# Patient Record
Sex: Female | Born: 1950 | Race: White | Hispanic: No | Marital: Married | Smoking: Former smoker
Health system: Southern US, Community
[De-identification: ages and names within clinical notes are randomized; demographics above are authoritative.]

## PROBLEM LIST (undated history)

## (undated) DIAGNOSIS — M25569 Pain in unspecified knee: Secondary | ICD-10-CM

## (undated) DIAGNOSIS — F419 Anxiety disorder, unspecified: Secondary | ICD-10-CM

## (undated) DIAGNOSIS — F32A Depression, unspecified: Secondary | ICD-10-CM

## (undated) DIAGNOSIS — I10 Essential (primary) hypertension: Secondary | ICD-10-CM

## (undated) DIAGNOSIS — H269 Unspecified cataract: Secondary | ICD-10-CM

## (undated) DIAGNOSIS — F329 Major depressive disorder, single episode, unspecified: Secondary | ICD-10-CM

## (undated) DIAGNOSIS — M1990 Unspecified osteoarthritis, unspecified site: Secondary | ICD-10-CM

## (undated) DIAGNOSIS — Z9989 Dependence on other enabling machines and devices: Secondary | ICD-10-CM

## (undated) DIAGNOSIS — H409 Unspecified glaucoma: Secondary | ICD-10-CM

## (undated) DIAGNOSIS — G4733 Obstructive sleep apnea (adult) (pediatric): Secondary | ICD-10-CM

## (undated) DIAGNOSIS — K219 Gastro-esophageal reflux disease without esophagitis: Secondary | ICD-10-CM

## (undated) DIAGNOSIS — E669 Obesity, unspecified: Secondary | ICD-10-CM

## (undated) DIAGNOSIS — D649 Anemia, unspecified: Secondary | ICD-10-CM

## (undated) DIAGNOSIS — M2550 Pain in unspecified joint: Secondary | ICD-10-CM

## (undated) DIAGNOSIS — E785 Hyperlipidemia, unspecified: Secondary | ICD-10-CM

## (undated) DIAGNOSIS — G4730 Sleep apnea, unspecified: Secondary | ICD-10-CM

## (undated) DIAGNOSIS — E039 Hypothyroidism, unspecified: Secondary | ICD-10-CM

## (undated) DIAGNOSIS — C801 Malignant (primary) neoplasm, unspecified: Secondary | ICD-10-CM

## (undated) DIAGNOSIS — I639 Cerebral infarction, unspecified: Secondary | ICD-10-CM

## (undated) DIAGNOSIS — K5730 Diverticulosis of large intestine without perforation or abscess without bleeding: Secondary | ICD-10-CM

## (undated) DIAGNOSIS — R011 Cardiac murmur, unspecified: Secondary | ICD-10-CM

## (undated) HISTORY — DX: Pain in unspecified knee: M25.569

## (undated) HISTORY — DX: Diverticulosis of large intestine without perforation or abscess without bleeding: K57.30

## (undated) HISTORY — PX: CATARACT EXTRACTION: SUR2

## (undated) HISTORY — DX: Cardiac murmur, unspecified: R01.1

## (undated) HISTORY — PX: COLONOSCOPY: SHX174

## (undated) HISTORY — DX: Unspecified glaucoma: H40.9

## (undated) HISTORY — DX: Pain in unspecified joint: M25.50

## (undated) HISTORY — PX: OTHER SURGICAL HISTORY: SHX169

## (undated) HISTORY — DX: Anemia, unspecified: D64.9

## (undated) HISTORY — PX: THYROIDECTOMY, PARTIAL: SHX18

## (undated) HISTORY — DX: Anxiety disorder, unspecified: F41.9

## (undated) HISTORY — DX: Major depressive disorder, single episode, unspecified: F32.9

## (undated) HISTORY — PX: BREAST SURGERY: SHX581

## (undated) HISTORY — DX: Hyperlipidemia, unspecified: E78.5

## (undated) HISTORY — DX: Sleep apnea, unspecified: G47.30

## (undated) HISTORY — DX: Depression, unspecified: F32.A

## (undated) HISTORY — DX: Obesity, unspecified: E66.9

## (undated) HISTORY — DX: Unspecified cataract: H26.9

## (undated) HISTORY — DX: Malignant (primary) neoplasm, unspecified: C80.1

## (undated) HISTORY — DX: Hypothyroidism, unspecified: E03.9

## (undated) HISTORY — DX: Dependence on other enabling machines and devices: Z99.89

## (undated) HISTORY — PX: GLAUCOMA REPAIR: SHX214

## (undated) HISTORY — DX: Obstructive sleep apnea (adult) (pediatric): G47.33

## (undated) HISTORY — PX: TONSILLECTOMY: SUR1361

## (undated) HISTORY — PX: MASTECTOMY, RADICAL: SHX710

## (undated) HISTORY — DX: Unspecified osteoarthritis, unspecified site: M19.90

---

## 2006-10-04 DIAGNOSIS — C801 Malignant (primary) neoplasm, unspecified: Secondary | ICD-10-CM

## 2006-10-04 HISTORY — DX: Malignant (primary) neoplasm, unspecified: C80.1

## 2010-01-24 LAB — HM PAP SMEAR: HM Pap smear: NORMAL

## 2010-04-27 LAB — HM COLONOSCOPY

## 2014-01-19 LAB — BASIC METABOLIC PANEL
BUN: 16 mg/dL (ref 4–21)
Creatinine: 0.8 mg/dL (ref <=1.1)
GLUCOSE: 81 mg/dL
Potassium: 4.6 mmol/L (ref 3.4–5.3)
Sodium: 142 mmol/L (ref 137–147)

## 2014-01-19 LAB — HEPATIC FUNCTION PANEL
ALK PHOS: 80 U/L (ref 25–125)
ALT: 42 U/L — AB (ref 7–35)
AST: 27 U/L (ref 13–35)
BILIRUBIN, TOTAL: 0.6 mg/dL

## 2014-01-19 LAB — LIPID PANEL
Cholesterol: 184 mg/dL (ref 0–200)
HDL: 38 mg/dL (ref 35–70)
LDL CALC: 118 mg/dL
Triglycerides: 118 mg/dL (ref 40–160)

## 2014-01-19 LAB — CBC AND DIFFERENTIAL
HCT: 39 % (ref 36–46)
Hemoglobin: 12.3 g/dL (ref 12.0–16.0)
NEUTROS ABS: 5 /uL
PLATELETS: 211 10*3/uL (ref 150–399)
WBC: 6.8 10*3/mL

## 2014-01-19 LAB — TSH: TSH: 0.97 u[IU]/mL (ref <=5.90)

## 2014-06-12 DIAGNOSIS — M7752 Other enthesopathy of left foot: Secondary | ICD-10-CM | POA: Diagnosis not present

## 2014-07-23 DIAGNOSIS — H1032 Unspecified acute conjunctivitis, left eye: Secondary | ICD-10-CM | POA: Diagnosis not present

## 2014-09-13 DIAGNOSIS — L03211 Cellulitis of face: Secondary | ICD-10-CM | POA: Diagnosis not present

## 2014-09-13 DIAGNOSIS — L0201 Cutaneous abscess of face: Secondary | ICD-10-CM | POA: Diagnosis not present

## 2014-10-29 DIAGNOSIS — H4011X1 Primary open-angle glaucoma, mild stage: Secondary | ICD-10-CM | POA: Diagnosis not present

## 2014-11-18 DIAGNOSIS — M7752 Other enthesopathy of left foot: Secondary | ICD-10-CM | POA: Diagnosis not present

## 2014-11-27 DIAGNOSIS — R262 Difficulty in walking, not elsewhere classified: Secondary | ICD-10-CM | POA: Diagnosis not present

## 2014-11-27 DIAGNOSIS — M6281 Muscle weakness (generalized): Secondary | ICD-10-CM | POA: Diagnosis not present

## 2014-11-27 DIAGNOSIS — M7752 Other enthesopathy of left foot: Secondary | ICD-10-CM | POA: Diagnosis not present

## 2014-11-27 DIAGNOSIS — M79672 Pain in left foot: Secondary | ICD-10-CM | POA: Diagnosis not present

## 2014-11-30 DIAGNOSIS — M79672 Pain in left foot: Secondary | ICD-10-CM | POA: Diagnosis not present

## 2014-11-30 DIAGNOSIS — M7752 Other enthesopathy of left foot: Secondary | ICD-10-CM | POA: Diagnosis not present

## 2014-11-30 DIAGNOSIS — M6281 Muscle weakness (generalized): Secondary | ICD-10-CM | POA: Diagnosis not present

## 2014-11-30 DIAGNOSIS — R262 Difficulty in walking, not elsewhere classified: Secondary | ICD-10-CM | POA: Diagnosis not present

## 2014-12-02 DIAGNOSIS — M6281 Muscle weakness (generalized): Secondary | ICD-10-CM | POA: Diagnosis not present

## 2014-12-02 DIAGNOSIS — M7752 Other enthesopathy of left foot: Secondary | ICD-10-CM | POA: Diagnosis not present

## 2014-12-02 DIAGNOSIS — R262 Difficulty in walking, not elsewhere classified: Secondary | ICD-10-CM | POA: Diagnosis not present

## 2014-12-02 DIAGNOSIS — M79672 Pain in left foot: Secondary | ICD-10-CM | POA: Diagnosis not present

## 2014-12-04 DIAGNOSIS — M6281 Muscle weakness (generalized): Secondary | ICD-10-CM | POA: Diagnosis not present

## 2014-12-04 DIAGNOSIS — M79672 Pain in left foot: Secondary | ICD-10-CM | POA: Diagnosis not present

## 2014-12-04 DIAGNOSIS — R262 Difficulty in walking, not elsewhere classified: Secondary | ICD-10-CM | POA: Diagnosis not present

## 2014-12-04 DIAGNOSIS — M7752 Other enthesopathy of left foot: Secondary | ICD-10-CM | POA: Diagnosis not present

## 2014-12-17 DIAGNOSIS — M6281 Muscle weakness (generalized): Secondary | ICD-10-CM | POA: Diagnosis not present

## 2014-12-17 DIAGNOSIS — M79672 Pain in left foot: Secondary | ICD-10-CM | POA: Diagnosis not present

## 2014-12-17 DIAGNOSIS — R262 Difficulty in walking, not elsewhere classified: Secondary | ICD-10-CM | POA: Diagnosis not present

## 2014-12-17 DIAGNOSIS — M7752 Other enthesopathy of left foot: Secondary | ICD-10-CM | POA: Diagnosis not present

## 2014-12-21 DIAGNOSIS — R262 Difficulty in walking, not elsewhere classified: Secondary | ICD-10-CM | POA: Diagnosis not present

## 2014-12-21 DIAGNOSIS — M6281 Muscle weakness (generalized): Secondary | ICD-10-CM | POA: Diagnosis not present

## 2014-12-21 DIAGNOSIS — M7752 Other enthesopathy of left foot: Secondary | ICD-10-CM | POA: Diagnosis not present

## 2014-12-21 DIAGNOSIS — M79672 Pain in left foot: Secondary | ICD-10-CM | POA: Diagnosis not present

## 2014-12-22 DIAGNOSIS — M25562 Pain in left knee: Secondary | ICD-10-CM | POA: Diagnosis not present

## 2014-12-24 DIAGNOSIS — M79672 Pain in left foot: Secondary | ICD-10-CM | POA: Diagnosis not present

## 2014-12-24 DIAGNOSIS — M6281 Muscle weakness (generalized): Secondary | ICD-10-CM | POA: Diagnosis not present

## 2014-12-24 DIAGNOSIS — R262 Difficulty in walking, not elsewhere classified: Secondary | ICD-10-CM | POA: Diagnosis not present

## 2014-12-24 DIAGNOSIS — M7752 Other enthesopathy of left foot: Secondary | ICD-10-CM | POA: Diagnosis not present

## 2014-12-28 DIAGNOSIS — M79672 Pain in left foot: Secondary | ICD-10-CM | POA: Diagnosis not present

## 2014-12-28 DIAGNOSIS — R262 Difficulty in walking, not elsewhere classified: Secondary | ICD-10-CM | POA: Diagnosis not present

## 2014-12-28 DIAGNOSIS — M6281 Muscle weakness (generalized): Secondary | ICD-10-CM | POA: Diagnosis not present

## 2014-12-28 DIAGNOSIS — M7752 Other enthesopathy of left foot: Secondary | ICD-10-CM | POA: Diagnosis not present

## 2015-01-01 DIAGNOSIS — M7752 Other enthesopathy of left foot: Secondary | ICD-10-CM | POA: Diagnosis not present

## 2015-01-01 DIAGNOSIS — R262 Difficulty in walking, not elsewhere classified: Secondary | ICD-10-CM | POA: Diagnosis not present

## 2015-01-01 DIAGNOSIS — M79672 Pain in left foot: Secondary | ICD-10-CM | POA: Diagnosis not present

## 2015-01-01 DIAGNOSIS — M6281 Muscle weakness (generalized): Secondary | ICD-10-CM | POA: Diagnosis not present

## 2015-01-04 DIAGNOSIS — M7752 Other enthesopathy of left foot: Secondary | ICD-10-CM | POA: Diagnosis not present

## 2015-01-04 DIAGNOSIS — R262 Difficulty in walking, not elsewhere classified: Secondary | ICD-10-CM | POA: Diagnosis not present

## 2015-01-04 DIAGNOSIS — M79672 Pain in left foot: Secondary | ICD-10-CM | POA: Diagnosis not present

## 2015-01-04 DIAGNOSIS — M6281 Muscle weakness (generalized): Secondary | ICD-10-CM | POA: Diagnosis not present

## 2015-01-07 DIAGNOSIS — M79672 Pain in left foot: Secondary | ICD-10-CM | POA: Diagnosis not present

## 2015-01-07 DIAGNOSIS — M6281 Muscle weakness (generalized): Secondary | ICD-10-CM | POA: Diagnosis not present

## 2015-01-07 DIAGNOSIS — R262 Difficulty in walking, not elsewhere classified: Secondary | ICD-10-CM | POA: Diagnosis not present

## 2015-01-07 DIAGNOSIS — M7752 Other enthesopathy of left foot: Secondary | ICD-10-CM | POA: Diagnosis not present

## 2015-03-05 DIAGNOSIS — M19072 Primary osteoarthritis, left ankle and foot: Secondary | ICD-10-CM | POA: Diagnosis not present

## 2015-03-05 DIAGNOSIS — B351 Tinea unguium: Secondary | ICD-10-CM | POA: Diagnosis not present

## 2015-03-05 DIAGNOSIS — L601 Onycholysis: Secondary | ICD-10-CM | POA: Diagnosis not present

## 2015-03-05 DIAGNOSIS — L602 Onychogryphosis: Secondary | ICD-10-CM | POA: Diagnosis not present

## 2015-03-05 DIAGNOSIS — M19071 Primary osteoarthritis, right ankle and foot: Secondary | ICD-10-CM | POA: Diagnosis not present

## 2015-03-25 DIAGNOSIS — M19071 Primary osteoarthritis, right ankle and foot: Secondary | ICD-10-CM | POA: Diagnosis not present

## 2015-03-25 DIAGNOSIS — L602 Onychogryphosis: Secondary | ICD-10-CM | POA: Diagnosis not present

## 2015-03-25 DIAGNOSIS — M19072 Primary osteoarthritis, left ankle and foot: Secondary | ICD-10-CM | POA: Diagnosis not present

## 2015-04-27 DIAGNOSIS — D2372 Other benign neoplasm of skin of left lower limb, including hip: Secondary | ICD-10-CM | POA: Diagnosis not present

## 2015-05-07 DIAGNOSIS — H401122 Primary open-angle glaucoma, left eye, moderate stage: Secondary | ICD-10-CM | POA: Diagnosis not present

## 2015-05-07 DIAGNOSIS — Z0100 Encounter for examination of eyes and vision without abnormal findings: Secondary | ICD-10-CM | POA: Diagnosis not present

## 2015-05-07 DIAGNOSIS — H401111 Primary open-angle glaucoma, right eye, mild stage: Secondary | ICD-10-CM | POA: Diagnosis not present

## 2015-05-07 DIAGNOSIS — H2513 Age-related nuclear cataract, bilateral: Secondary | ICD-10-CM | POA: Diagnosis not present

## 2015-06-22 DIAGNOSIS — H25012 Cortical age-related cataract, left eye: Secondary | ICD-10-CM | POA: Diagnosis not present

## 2015-06-22 DIAGNOSIS — H25812 Combined forms of age-related cataract, left eye: Secondary | ICD-10-CM | POA: Diagnosis not present

## 2015-06-22 DIAGNOSIS — H2512 Age-related nuclear cataract, left eye: Secondary | ICD-10-CM | POA: Diagnosis not present

## 2015-06-29 DIAGNOSIS — D2372 Other benign neoplasm of skin of left lower limb, including hip: Secondary | ICD-10-CM | POA: Diagnosis not present

## 2015-07-23 DIAGNOSIS — R42 Dizziness and giddiness: Secondary | ICD-10-CM | POA: Diagnosis not present

## 2015-08-02 DIAGNOSIS — R42 Dizziness and giddiness: Secondary | ICD-10-CM | POA: Diagnosis not present

## 2015-08-02 DIAGNOSIS — R590 Localized enlarged lymph nodes: Secondary | ICD-10-CM | POA: Diagnosis not present

## 2015-08-05 DIAGNOSIS — R42 Dizziness and giddiness: Secondary | ICD-10-CM | POA: Diagnosis not present

## 2015-08-05 DIAGNOSIS — H8393 Unspecified disease of inner ear, bilateral: Secondary | ICD-10-CM | POA: Diagnosis not present

## 2015-08-05 DIAGNOSIS — R2689 Other abnormalities of gait and mobility: Secondary | ICD-10-CM | POA: Diagnosis not present

## 2015-09-14 DIAGNOSIS — H2511 Age-related nuclear cataract, right eye: Secondary | ICD-10-CM | POA: Diagnosis not present

## 2015-09-14 DIAGNOSIS — H25011 Cortical age-related cataract, right eye: Secondary | ICD-10-CM | POA: Diagnosis not present

## 2015-09-14 DIAGNOSIS — H25811 Combined forms of age-related cataract, right eye: Secondary | ICD-10-CM | POA: Diagnosis not present

## 2015-09-29 DIAGNOSIS — D239 Other benign neoplasm of skin, unspecified: Secondary | ICD-10-CM | POA: Diagnosis not present

## 2015-10-26 DIAGNOSIS — D2372 Other benign neoplasm of skin of left lower limb, including hip: Secondary | ICD-10-CM | POA: Diagnosis not present

## 2015-11-03 ENCOUNTER — Encounter: Payer: Self-pay | Admitting: General Practice

## 2015-12-02 ENCOUNTER — Encounter: Payer: Self-pay | Admitting: Family Medicine

## 2015-12-02 ENCOUNTER — Ambulatory Visit (INDEPENDENT_AMBULATORY_CARE_PROVIDER_SITE_OTHER): Payer: Medicare Other | Admitting: Family Medicine

## 2015-12-02 VITALS — BP 118/78 | HR 82 | Temp 98.2°F | Resp 16 | Ht 62.0 in | Wt 237.5 lb

## 2015-12-02 DIAGNOSIS — E669 Obesity, unspecified: Secondary | ICD-10-CM | POA: Insufficient documentation

## 2015-12-02 DIAGNOSIS — E038 Other specified hypothyroidism: Secondary | ICD-10-CM | POA: Diagnosis not present

## 2015-12-02 DIAGNOSIS — F418 Other specified anxiety disorders: Secondary | ICD-10-CM | POA: Diagnosis not present

## 2015-12-02 DIAGNOSIS — F329 Major depressive disorder, single episode, unspecified: Secondary | ICD-10-CM | POA: Insufficient documentation

## 2015-12-02 DIAGNOSIS — N3281 Overactive bladder: Secondary | ICD-10-CM | POA: Insufficient documentation

## 2015-12-02 DIAGNOSIS — Z6837 Body mass index (BMI) 37.0-37.9, adult: Secondary | ICD-10-CM | POA: Insufficient documentation

## 2015-12-02 DIAGNOSIS — C50412 Malignant neoplasm of upper-outer quadrant of left female breast: Secondary | ICD-10-CM | POA: Insufficient documentation

## 2015-12-02 DIAGNOSIS — C50912 Malignant neoplasm of unspecified site of left female breast: Secondary | ICD-10-CM

## 2015-12-02 DIAGNOSIS — F419 Anxiety disorder, unspecified: Secondary | ICD-10-CM

## 2015-12-02 DIAGNOSIS — E039 Hypothyroidism, unspecified: Secondary | ICD-10-CM | POA: Insufficient documentation

## 2015-12-02 DIAGNOSIS — F32A Depression, unspecified: Secondary | ICD-10-CM | POA: Insufficient documentation

## 2015-12-02 NOTE — Progress Notes (Signed)
Pre visit review using our clinic review tool, if applicable. No additional management support is needed unless otherwise documented below in the visit note. 

## 2015-12-02 NOTE — Progress Notes (Signed)
   Subjective:    Patient ID: Vanessa Christensen, female    DOB: March 27, 1951, 65 y.o.   MRN: [id number]  HPI New to establish.  Previous MD- Terese Door, [place] 2 yrs ago  Hypothyroid- chronic problem, on 75 mcg Levothyroxine daily.  Denies excessive fatigue, palpitations, changes to skin/hair/nails.  Denies constipation or diarrhea.  OAB- chronic problem, on Vesicare but not controlling her sxs.  Pt will have urinary leakage w/ rising from a lying position.  Has not seen urology previously.  Pt is not interested at this time, 'it's not that bad'.  Anxiety/depression- chronic problem, well controlled on Lexapro.  Denies SI/HI.    Obesity- pt's BMI is 43.  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  Pt is not following a particular diet but has a weight loss goal of 50-80 lbs.  Has done Weight Watchers successfully in the past.  Hx of breast cancer- dx'd 2008.  still on Arimidex w/ plans to stop next year.  Pt is in need of oncology referral.   Review of Systems For ROS see HPI     Objective:   Physical Exam  Constitutional: She is oriented to person, place, and time. She appears well-developed and well-nourished. No distress.  HENT:  Head: Normocephalic and atraumatic.  Eyes: Conjunctivae and EOM are normal. Pupils are equal, round, and reactive to light.  Neck: Normal range of motion. Neck supple. No thyromegaly present.  Cardiovascular: Normal rate, regular rhythm, normal heart sounds and intact distal pulses.   No murmur heard. Pulmonary/Chest: Effort normal and breath sounds normal. No respiratory distress.  Abdominal: Soft. She exhibits no distension. There is no tenderness.  Musculoskeletal: She exhibits no edema.  Lymphadenopathy:    She has no cervical adenopathy.  Neurological: She is alert and oriented to person, place, and time.  Skin: Skin is warm and dry.  Psychiatric: She has a normal mood and affect. Her  behavior is normal.  Vitals reviewed.         Assessment & Plan:

## 2015-12-02 NOTE — Patient Instructions (Signed)
Schedule a fasting lab visit for tomorrow or next week Schedule your complete physical in 6 months We'll notify you of your lab results and make any changes if needed Continue to work on healthy diet and regular exercise- you can do it!!! If the bladder leakage becomes more of an issue- let me know! We'll call you with your Oncology appt Call with any questions or concerns Welcome!!!  We're glad to have you!!!

## 2015-12-03 NOTE — Assessment & Plan Note (Signed)
New to provider, ongoing for pt.  She is now working w/ a Physiological scientist and considering going back to [employer].  Check labs to risk stratify.  Will follow.

## 2015-12-03 NOTE — Assessment & Plan Note (Signed)
Chronic problem.  Currently asymptomatic.  Due for repeat labs.  Adjust meds prn.

## 2015-12-03 NOTE — Assessment & Plan Note (Signed)
New to provider, ongoing for pt.  Still on Arimidex.  Needs local Onc.  Referral placed.

## 2015-12-03 NOTE — Assessment & Plan Note (Signed)
New to provider, ongoing for pt.  She reports sxs are currently well controlled.  Will continue to follow.

## 2015-12-03 NOTE — Assessment & Plan Note (Signed)
Chronic problem.  She is still having leakage w/ her Vesicare but she is not interested in seeing Urology at this time.  Discussed importance of Kegels.  Will follow.

## 2015-12-08 ENCOUNTER — Other Ambulatory Visit (INDEPENDENT_AMBULATORY_CARE_PROVIDER_SITE_OTHER): Payer: Medicare Other

## 2015-12-08 ENCOUNTER — Encounter: Payer: Self-pay | Admitting: General Practice

## 2015-12-08 DIAGNOSIS — E038 Other specified hypothyroidism: Secondary | ICD-10-CM | POA: Diagnosis not present

## 2015-12-08 LAB — CBC WITH DIFFERENTIAL/PLATELET
BASOS PCT: 0.5 % (ref 0.0–3.0)
Basophils Absolute: 0 10*3/uL (ref 0.0–0.1)
EOS ABS: 0 10*3/uL (ref 0.0–0.7)
Eosinophils Relative: 0.2 % (ref 0.0–5.0)
HCT: 37.4 % (ref 36.0–46.0)
Hemoglobin: 12.4 g/dL (ref 12.0–15.0)
Lymphocytes Relative: 22.6 % (ref 12.0–46.0)
Lymphs Abs: 1.2 10*3/uL (ref 0.7–4.0)
MCHC: 33.2 g/dL (ref 30.0–36.0)
MCV: 82.4 fl (ref 78.0–100.0)
MONO ABS: 0.6 10*3/uL (ref 0.1–1.0)
Monocytes Relative: 12.2 % — ABNORMAL HIGH (ref 3.0–12.0)
NEUTROS ABS: 3.4 10*3/uL (ref 1.4–7.7)
NEUTROS PCT: 64.5 % (ref 43.0–77.0)
PLATELETS: 190 10*3/uL (ref 150.0–400.0)
RBC: 4.54 Mil/uL (ref 3.87–5.11)
RDW: 15.4 % (ref 11.5–15.5)
WBC: 5.3 10*3/uL (ref 4.0–10.5)

## 2015-12-08 LAB — BASIC METABOLIC PANEL
BUN: 19 mg/dL (ref 6–23)
CHLORIDE: 104 meq/L (ref 96–112)
CO2: 29 mEq/L (ref 19–32)
CREATININE: 0.78 mg/dL (ref 0.40–1.20)
Calcium: 9.8 mg/dL (ref 8.4–10.5)
GFR: 78.29 mL/min (ref >60.00)
Glucose, Bld: 89 mg/dL (ref 70–99)
POTASSIUM: 5 meq/L (ref 3.5–5.1)
Sodium: 138 mEq/L (ref 135–145)

## 2015-12-08 LAB — LIPID PANEL
CHOL/HDL RATIO: 4
Cholesterol: 184 mg/dL (ref 0–200)
HDL: 43.2 mg/dL (ref >39.00)
LDL CALC: 121 mg/dL — AB (ref 0–99)
NONHDL: 140.5
TRIGLYCERIDES: 99 mg/dL (ref 0.0–149.0)
VLDL: 19.8 mg/dL (ref 0.0–40.0)

## 2015-12-08 LAB — TSH: TSH: 0.98 u[IU]/mL (ref 0.35–4.50)

## 2015-12-08 LAB — HEPATIC FUNCTION PANEL
ALT: 23 U/L (ref 0–35)
AST: 19 U/L (ref 0–37)
Albumin: 4.5 g/dL (ref 3.5–5.2)
Alkaline Phosphatase: 68 U/L (ref 39–117)
BILIRUBIN TOTAL: 0.5 mg/dL (ref 0.2–1.2)
Bilirubin, Direct: 0.1 mg/dL (ref 0.0–0.3)
Total Protein: 6.9 g/dL (ref 6.0–8.3)

## 2015-12-28 ENCOUNTER — Telehealth: Payer: Self-pay | Admitting: Hematology and Oncology

## 2015-12-28 ENCOUNTER — Encounter: Payer: Self-pay | Admitting: Hematology

## 2015-12-28 ENCOUNTER — Telehealth: Payer: Self-pay | Admitting: Hematology

## 2015-12-28 ENCOUNTER — Encounter: Payer: Self-pay | Admitting: Hematology and Oncology

## 2015-12-28 NOTE — Telephone Encounter (Signed)
Made appointment with Dr. Alvy Bimler for 8/3 at 2pm with Tammy at Dr. Carlyle Lipa office. Dr. Felipa Eth originally referred patient to Dr. Benay Spice instead wanted the first available MD. Tammy gave the patient the appointment date and time and explained to have the patient arrive 30 minutes early. Demographics verified and address given to the patient over the phone. Letter mailed to the patient.

## 2015-12-28 NOTE — Telephone Encounter (Signed)
Appointment with Dr. Burr Medico on 8/8 at 2:30pm. Patient agreed to date and time. Demographics verified. Letter to referring.

## 2016-01-03 ENCOUNTER — Telehealth: Payer: Self-pay | Admitting: *Deleted

## 2016-01-03 NOTE — Telephone Encounter (Signed)
Mailed new pt packet to pt.  

## 2016-01-07 ENCOUNTER — Encounter: Payer: Self-pay | Admitting: Family Medicine

## 2016-01-07 ENCOUNTER — Ambulatory Visit (INDEPENDENT_AMBULATORY_CARE_PROVIDER_SITE_OTHER): Payer: Medicare Other | Admitting: Family Medicine

## 2016-01-07 VITALS — BP 123/79 | HR 78 | Temp 98.9°F | Resp 20 | Wt 229.8 lb

## 2016-01-07 DIAGNOSIS — H8110 Benign paroxysmal vertigo, unspecified ear: Secondary | ICD-10-CM

## 2016-01-07 MED ORDER — MECLIZINE HCL 25 MG PO TABS: 25.0000 mg | ORAL_TABLET | Freq: Three times a day (TID) | ORAL | 0 refills | Status: DC | PRN

## 2016-01-07 NOTE — Patient Instructions (Signed)
Benign Positional Vertigo Vertigo is the feeling that you or your surroundings are moving when they are not. Benign positional vertigo is the most common form of vertigo. The cause of this condition is not serious (is benign). This condition is triggered by certain movements and positions (is positional). This condition can be dangerous if it occurs while you are doing something that could endanger you or others, such as driving.  CAUSES In many cases, the cause of this condition is not known. It may be caused by a disturbance in an area of the inner ear that helps your brain to sense movement and balance. This disturbance can be caused by a viral infection (labyrinthitis), head injury, or repetitive motion. RISK FACTORS This condition is more likely to develop in:  Women.  People who are 50 years of age or older. SYMPTOMS Symptoms of this condition usually happen when you move your head or your eyes in different directions. Symptoms may start suddenly, and they usually last for less than a minute. Symptoms may include:  Loss of balance and falling.  Feeling like you are spinning or moving.  Feeling like your surroundings are spinning or moving.  Nausea and vomiting.  Blurred vision.  Dizziness.  Involuntary eye movement (nystagmus). Symptoms can be mild and cause only slight annoyance, or they can be severe and interfere with daily life. Episodes of benign positional vertigo may return (recur) over time, and they may be triggered by certain movements. Symptoms may improve over time. DIAGNOSIS This condition is usually diagnosed by medical history and a physical exam of the head, neck, and ears. You may be referred to a health care provider who specializes in ear, nose, and throat (ENT) problems (otolaryngologist) or a provider who specializes in disorders of the nervous system (neurologist). You may have additional testing, including:  MRI.  A CT scan.  Eye movement tests. Your  health care provider may ask you to change positions quickly while he or she watches you for symptoms of benign positional vertigo, such as nystagmus. Eye movement may be tested with an electronystagmogram (ENG), caloric stimulation, the Dix-Hallpike test, or the roll test.  An electroencephalogram (EEG). This records electrical activity in your brain.  Hearing tests. TREATMENT Usually, your health care provider will treat this by moving your head in specific positions to adjust your inner ear back to normal. Surgery may be needed in severe cases, but this is rare. In some cases, benign positional vertigo may resolve on its own in 2-4 weeks. HOME CARE INSTRUCTIONS Safety  Move slowly.Avoid sudden body or head movements.  Avoid driving.  Avoid operating heavy machinery.  Avoid doing any tasks that would be dangerous to you or others if a vertigo episode would occur.  If you have trouble walking or keeping your balance, try using a cane for stability. If you feel dizzy or unstable, sit down right away.  Return to your normal activities as told by your health care provider. Ask your health care provider what activities are safe for you. General Instructions  Take over-the-counter and prescription medicines only as told by your health care provider.  Avoid certain positions or movements as told by your health care provider.  Drink enough fluid to keep your urine clear or pale yellow.  Keep all follow-up visits as told by your health care provider. This is important. SEEK MEDICAL CARE IF:  You have a fever.  Your condition gets worse or you develop new symptoms.  Your family or friends   notice any behavioral changes.  Your nausea or vomiting gets worse.  You have numbness or a "pins and needles" sensation. SEEK IMMEDIATE MEDICAL CARE IF:  You have difficulty speaking or moving.  You are always dizzy.  You faint.  You develop severe headaches.  You have weakness in your  legs or arms.  You have changes in your hearing or vision.  You develop a stiff neck.  You develop sensitivity to light.   This information is not intended to replace advice given to you by your health care provider. Make sure you discuss any questions you have with your health care provider.   Document Released: 02/27/2006 Document Revised: 02/10/2015 Document Reviewed: 09/14/2014 Elsevier Interactive Patient Education 2016 [employer]  

## 2016-01-07 NOTE — Progress Notes (Signed)
Vanessa Christensen , 01/03/1951, 65 y.o., female MRN: [id number] Patient Care Team    Relationship Specialty Notifications Start End  [name], MD PCP - General Family Medicine  09/16/15     CC: dizziness Subjective: Pt presents for an acute OV with complaints of dizziness of 2 weeks duration.  Her dizziness is worsened when rolling over in bed and turning her head sharply. When she was working out a few days ago She turned to the left, she got dizzy and vomited. She reports the room spinning. These events last less than a minute, and occur a couple times day, always with positional change.  Pt has a h/o vertigo in the past and is not on medication. Pt denies ear pain, fever, chills, nausea, sore throat, night sweats, unintentional weight loss. She is a breast cancer survivor. She has been working out more frequently and reports attempting to drink 80 ounces of water a day.   Allergies  Allergen Reactions  . Neulasta [Pegfilgrastim]    Social History  Substance Use Topics  . Smoking status: Former Smoker    Quit date: 11/03/1978  . Smokeless tobacco: Never Used  . Alcohol use No   Past Medical History:  Diagnosis Date  . Anxiety   . Cancer ([place]) 10/04/2006   breast  . Depression   . Diverticulosis of colon   . OSA on CPAP    Past Surgical History:  Procedure Laterality Date  . BREAST SURGERY Bilateral   . CATARACT EXTRACTION    . GLAUCOMA REPAIR    . MASTECTOMY, RADICAL Bilateral   . neulasta induced sterile abscesses    . THYROIDECTOMY, PARTIAL    . TOTAL KNEE ARTHROPLASTY Right    Family History  Problem Relation Age of Onset  . CVA Mother   . Leukemia Father      Medication List       Accurate as of 01/07/16 10:53 AM. Always use your most recent med list.          anastrozole 1 MG tablet Commonly known as:  ARIMIDEX Take 1 mg by mouth daily.   aspirin 81 MG tablet Take 81 mg by mouth daily.   AZOPT 1 % ophthalmic suspension Generic drug:   brinzolamide PLACE 1 DROP INTO BOTH EYES TWICE DAILY   b complex vitamins tablet Take 1 tablet by mouth daily.   escitalopram 20 MG tablet Commonly known as:  LEXAPRO Take 30 mg by mouth daily.   levothyroxine 75 MCG tablet Commonly known as:  SYNTHROID, LEVOTHROID Take 75 mcg by mouth daily.   MULTIVITAMIN ADULTS 50+ PO Take by mouth.   psyllium 58.6 % powder Commonly known as:  METAMUCIL Take 1 packet by mouth 3 (three) times daily.   triamcinolone cream 0.1 % Commonly known as:  KENALOG Reported on 12/02/2015   VESICARE 10 MG tablet Generic drug:  solifenacin Take 10 mg by mouth daily.   VITAMIN C ER PO Take by mouth.       No results found for this or any previous visit (from the past 24 hour(s)). No results found.   ROS: Negative, with the exception of above mentioned in HPI   Objective:  BP 123/79 (BP Location: Right Arm, Patient Position: Sitting, Cuff Size: Large)   Pulse 78   Temp 98.9 F (37.2 C) (Oral)   Resp 20   Wt 229 lb 12 oz (104.2 kg)   SpO2 99%   BMI 42.02 kg/m  Body mass index  is 42.02 kg/m. Gen: Afebrile. No acute distress. Nontoxic in appearance, well developed, well nourished. Obese, very pleasant Caucasian female. HENT: AT. [place]. Bilateral TM visualized without erythema, bulging or air-fluid levels. MMM, no oral lesions. Bilateral nares without erythema or swelling. Throat without erythema or exudates.  Eyes:Pupils Equal Round Reactive to light, Extraocular movements intact,  Conjunctiva without redness, discharge or icterus. Neck/lymp/endocrine: Supple, no lymphadenopathy CV: RRR  Chest: CTAB, no wheeze or crackles. Neuro:Normal gait. PERLA. EOMi. Alert. Oriented x3 Cranial nerves II through XII intact. Muscle strength 5/5 upper and lower extremity.  Psych: Normal affect, dress and demeanor. Normal speech. Normal thought content and judgment.  Assessment/Plan: Vanessa Christensen is a 65 y.o. female present for acute OV for  Benign  paroxysmal positional vertigo, unspecified laterality - Patient symptoms sound consistent with BPPV. Original onset of symptoms was 6 months ago, worsening over last 2 weeks. Discussed with patient to make certain she is maintaining good hydration, especially she is working out. Referral to vestibular rehabilitation place today. Trial of meclizine considering symptoms are daily. - If symptoms do not resolve or worsen with above regimen would want to perform CT of head considering her history. - meclizine (ANTIVERT) 25 MG tablet; Take 1 tablet (25 mg total) by mouth 3 (three) times daily as needed for dizziness.  Dispense: 90 tablet; Refill: 0 - Ambulatory referral to Physical Therapy - Follow-up 4 weeks, sooner if worsening symptoms.  electronically signed by:  Howard Pouch, DO  [place]

## 2016-01-11 ENCOUNTER — Ambulatory Visit (HOSPITAL_BASED_OUTPATIENT_CLINIC_OR_DEPARTMENT_OTHER): Payer: Medicare Other | Admitting: Hematology

## 2016-01-11 ENCOUNTER — Encounter: Payer: Self-pay | Admitting: Hematology

## 2016-01-11 ENCOUNTER — Telehealth: Payer: Self-pay | Admitting: Hematology

## 2016-01-11 VITALS — BP 131/56 | HR 84 | Temp 98.8°F | Resp 17 | Ht 62.0 in | Wt 227.4 lb

## 2016-01-11 DIAGNOSIS — Z170 Estrogen receptor positive status [ER+]: Secondary | ICD-10-CM | POA: Diagnosis not present

## 2016-01-11 DIAGNOSIS — Z79811 Long term (current) use of aromatase inhibitors: Secondary | ICD-10-CM | POA: Diagnosis not present

## 2016-01-11 DIAGNOSIS — E2839 Other primary ovarian failure: Secondary | ICD-10-CM

## 2016-01-11 DIAGNOSIS — C50412 Malignant neoplasm of upper-outer quadrant of left female breast: Secondary | ICD-10-CM

## 2016-01-11 NOTE — Progress Notes (Signed)
[place]  [phone] [phone] Irena Note   Patient Care Team: [name], MD as PCP - General (Family Medicine) 01/11/2016  CHIEF COMPLAINTS/PURPOSE OF CONSULTATION:  Left breast cancer   Oncology History   Breast cancer of upper-outer quadrant of left female breast [facility])   Staging form: Breast, AJCC 7th Edition   - Pathologic stage from 10/31/2006: Stage IIB (T2, N25m, cM0) - Signed by YTruitt Merle MD on 01/11/2016      Breast cancer of upper-outer quadrant of left female breast ([place]   09/25/2006 Initial Biopsy    Left breast 2:00 position mass core needle biopsy showed invasive carcinoma with overlapping features of lobular and ductal carcinoma, grade 2.     09/25/2006 Receptors her2    ER 3+ positive, PR 3+ positive, HER-2 equivocal on IHC, [facility] of [facility]     09/25/2006 Initial Diagnosis    Breast cancer of upper-outer quadrant of left female breast ([place]     10/01/2006 Miscellaneous    BRCA1 and BRCA2 gene sequencing was negative for mutation.     10/31/2006 Surgery    Bilateral breast mastectomy and left sentinel lymph node biopsy     10/31/2006 Pathology Results    Left breast mastectomy showed 3.9 cm invasive lobular carcinoma, grade 2, surgical margins were negative, will follow 2 lymph nodes had microscopic metastasis, 2 axillary node were negative.     01/10/2007 - 08/08/2007 Chemotherapy    Adriamycin 1220mand Cytoxan 120066mvery 4 weeks for 4 cycles, followed by docetaxel 150m2mr 2 cycles.   On  Well. The 102 Intact is that I think will see the     09/09/2008 Imaging    PET scan showed no evidence of recurrence or metastatic breast cancer.      HISTORY OF PRESENTING ILLNESS:  Vanessa Bynumy65. female is here because of Her history of left breast cancer. She was referred by her primary care physician to transfer her oncological care to us, [place]. She presents to the clinic by herself today.  She was diagnosed in 09/2006, and underwent left breast mastectomy and sentinel lymph node biopsy on 10/31/2006, which showed a 3.9 cm invasive lobular carcinoma, and a microscopic metastasis in 1 of the sentinel lymph nodes. She also had right prophylactic mastectomy on the same day. She initially planned to have reconstruction, however she had wound infection after surgery, and reconstruction was canceled. She had adjuvant chemotherapy with Adriamycin, Cytoxan and docetaxel, for which she completed in March 2009. She subsequently started anastrozole, has been tolerating well for the past 9 years. She has mild hot flush, no other noticeable side effects from anastrozole.  She feels well overall. She has osteoarthritis, had right total knee replacement, and has mild arthralgia the other joints, which is tolerable. She is very physically active, has a persPhysiological scientist exercise including lifting etc at least 3 hours a week.  She has changed her diet lately and lost about 8lbs in the past few weeks. She is married, lives with her husband. [place] to be closer to their daughter. She is retired.   MEDICAL HISTORY:  Past Medical History:  Diagnosis Date  . Anxiety   . Cancer (HCC)[place]/2008   breast  . Depression   . Diverticulosis of colon   . OSA on  CPAP     SURGICAL HISTORY: Past Surgical History:  Procedure Laterality Date  . BREAST SURGERY Bilateral   . CATARACT EXTRACTION    . GLAUCOMA REPAIR    . MASTECTOMY, RADICAL Bilateral   . neulasta induced sterile abscesses    . THYROIDECTOMY, PARTIAL    . TOTAL KNEE ARTHROPLASTY Right     SOCIAL HISTORY: Social History   Social History  . Marital status: Married    Spouse name: N/A  . Number of children: N/A  . Years of education: N/A   Occupational History  . Not on file.   Social  History Main Topics  . Smoking status: Former Smoker    Quit date: 11/03/1978  . Smokeless tobacco: Never Used  . Alcohol use No  . Drug use: No  . Sexual activity: Not on file   Other Topics Concern  . Not on file   Social History Narrative  . No narrative on file    FAMILY HISTORY: Family History  Problem Relation Age of Onset  . CVA Mother   . Leukemia Father    Mother had breast cancer at age of 64, and maternal aunt in 76  Father had leukemia   ALLERGIES:  is allergic to neulasta [pegfilgrastim].  MEDICATIONS:  Current Outpatient Prescriptions  Medication Sig Dispense Refill  . anastrozole (ARIMIDEX) 1 MG tablet Take 1 mg by mouth daily.  4  . Ascorbic Acid (VITAMIN C ER PO) Take 1 tablet by mouth daily.     Marland Kitchen aspirin 81 MG tablet Take 81 mg by mouth daily.    . AZOPT 1 % ophthalmic suspension PLACE 1 DROP INTO BOTH EYES TWICE DAILY  12  . b complex vitamins tablet Take 1 tablet by mouth daily.    Marland Kitchen escitalopram (LEXAPRO) 20 MG tablet Take 30 mg by mouth daily.  3  . levothyroxine (SYNTHROID, LEVOTHROID) 75 MCG tablet Take 75 mcg by mouth daily.  2  . Multiple Vitamins-Minerals (MULTIVITAMIN ADULTS 50+ PO) Take 1 tablet by mouth daily.     . psyllium (METAMUCIL) 58.6 % powder Take 1 packet by mouth daily.     Marland Kitchen triamcinolone cream (KENALOG) 0.1 % Use as needed for bug bites.  10  . VESICARE 10 MG tablet Take 10 mg by mouth daily.     . meclizine (ANTIVERT) 25 MG tablet Take 1 tablet (25 mg total) by mouth 3 (three) times daily as needed for dizziness. (Patient not taking: Reported on 01/11/2016) 90 tablet 0   No current facility-administered medications for this visit.     REVIEW OF SYSTEMS:   Constitutional: Denies fevers, chills or abnormal night sweats Eyes: Denies blurriness of vision, double vision or watery eyes Ears, nose, mouth, throat, and face: Denies mucositis or sore throat Respiratory: Denies cough, dyspnea or wheezes Cardiovascular: Denies  palpitation, chest discomfort or lower extremity swelling Gastrointestinal:  Denies nausea, heartburn or change in bowel habits Skin: Denies abnormal skin rashes Lymphatics: Denies new lymphadenopathy or easy bruising Neurological:Denies numbness, tingling or new weaknesses Behavioral/Psych: Mood is stable, no new changes  All other systems were reviewed with the patient and are negative.  PHYSICAL EXAMINATION: ECOG PERFORMANCE STATUS: 0 - Asymptomatic  Vitals:   01/11/16 1449  BP: (!) 131/56  Pulse: 84  Resp: 17  Temp: 98.8 F (37.1 C)   Filed Weights   01/11/16 1449  Weight: 227 lb 6.4 oz (103.1 kg)    GENERAL:alert, no distress and comfortable SKIN: skin color, texture, turgor  are normal, no rashes or significant lesions EYES: normal, conjunctiva are pink and non-injected, sclera clear OROPHARYNX:no exudate, no erythema and lips, buccal mucosa, and tongue normal  NECK: supple, thyroid normal size, non-tender, without nodularity LYMPH:  no palpable lymphadenopathy in the cervical, axillary or inguinal LUNGS: clear to auscultation and percussion with normal breathing effort HEART: regular rate & rhythm and no murmurs and no lower extremity edema ABDOMEN:abdomen soft, non-tender and normal bowel sounds Musculoskeletal:no cyanosis of digits and no clubbing  PSYCH: alert & oriented x 3 with fluent speech NEURO: no focal motor/sensory deficits. Breasts: Breast inspection showed bilateral mastectomy, (+) soft tissue fullness at both incision sites (access skin which was left for reconstruction), no masses or nodularity on chest wall, no palpable axillary adenopathy.   LABORATORY DATA:  I have reviewed the data as listed CBC Latest Ref Rng & Units 12/08/2015 01/19/2014  WBC 4.0 - 10.5 K/uL 5.3 6.8  Hemoglobin 12.0 - 15.0 g/dL 12.4 12.3  Hematocrit 36.0 - 46.0 % 37.4 39  Platelets 150.0 - 400.0 K/uL 190.0 211   CMP Latest Ref Rng & Units 12/08/2015 01/19/2014  Glucose 70 - 99  mg/dL 89 -  BUN 6 - 23 mg/dL 19 16  Creatinine 0.40 - 1.20 mg/dL 0.78 0.8  Sodium 135 - 145 mEq/L 138 142  Potassium 3.5 - 5.1 mEq/L 5.0 4.6  Chloride 96 - 112 mEq/L 104 -  CO2 19 - 32 mEq/L 29 -  Calcium 8.4 - 10.5 mg/dL 9.8 -  Total Protein 6.0 - 8.3 g/dL 6.9 -  Total Bilirubin 0.2 - 1.2 mg/dL 0.5 -  Alkaline Phos 39 - 117 U/L 68 80  AST 0 - 37 U/L 19 27  ALT 0 - 35 U/L 23 42(A)   PATHOLOGY REPORT  DIAGNOSIS 10/31/2006 1. Left axillary sentinel lymph node: 1 of 2 lymph nodes positive for Michael metastatic carcinoma, 0.5 mm 2. Left breast, mastectomy A tumor type: Infiltrative lobular B: Grade 2 C: Maximal invasive tumor size 3.9 cm D: LCIS, comprising less than 5% of tumor E: Margins of resection negative for carcinoma F: Lymph nodes: 3 lymph nodes in axillary tail negative for carcinoma G: pT2pNmiMx H: Ancillary studies: Previously performed ER 3+, PR 3+, HER-2 equivocal. HER-2/neu Fish performed [facility] shows no evidence of HER-2/neu gene amplification with ratio of 1.05 3. right breast, mastectomy Atypical lobular hyperplasia with ductal involvement, central, upper outer and lower outer breast tissue fibrocystic changes. 2 lymph nodes in as a retail negative for carcinoma.    RADIOGRAPHIC STUDIES: I have personally reviewed the radiological images as listed and agreed with the findings in the report. No results found.  ASSESSMENT & PLAN: 65 year old postmenopausal Caucasian female, history of left breast cancer.  1. 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 -She had a stage II hormone receptor positive, HER-2 negative breast cancer, and received adjuvant chemotherapy and currently on adjuvant anastrozole. -We discussed the risk of cancer recurrence after surgery. Prognostic genomic testing such  as Oncotype was not available when she was diagnosed. Based on her stage and ER/PR/HER-2 status, I think she probably has low to moderate risk of recurrence. -We discussed late recurrence is possible in hormonal receptor positive disease. -Given her stage II disease and lobular histology, I do recommend aromatase inhibitor for a total of 10 years. She will complete in May 2018. -  We reviewed the potential side effects from aromatase inhibitor, she is tolerating well overall. -She has not had bone density scan for several years, I'll obtain one in the next month -We'll continue breast cancer surveillance, including routine follow-up every 6-12 months with lab and exam. She had bilateral mastectomy, does not need screening mammogram. -She is clinically doing very well, her physical exam was unremarkable, labs including CBC and CMP was normal last month. No evidence of recurrence. -I encouraged her to have healthy diet, and continue exercise regularly. She is overweight, and has been trying to lose some weight.   2. Hypothyroidism, depression, obesity -She'll continue follow-up with her primary care physician  Plan -Continue anastrozole until May 2018 -A bone density scan in the next month -I'll see her back in 6 months with lab   Orders Placed This Encounter  Procedures  . DG Bone Density    EPIC ORDER PF: NONE NO NEEDS  CR/CARLYN  MEDICARE/BCBS  NO CAL SUPPL     Standing Status:   Future    Standing Expiration Date:   01/10/2017    Order Specific Question:   Reason for Exam (SYMPTOM  OR DIAGNOSIS REQUIRED)    Answer:   rule out osteoporosis    Order Specific Question:   Preferred imaging location?    Answer:   [facility]    All questions were answered. The patient knows to call the clinic with any problems, questions or concerns. I spent 40 minutes counseling the patient face to face. The total time spent in the appointment was 55 minutes and more than 50% was on  counseling.     Truitt Merle, MD 01/11/2016 7:06 PM

## 2016-01-11 NOTE — Telephone Encounter (Signed)
Gave pt cal & avs °

## 2016-01-19 ENCOUNTER — Ambulatory Visit
Admission: RE | Admit: 2016-01-19 | Discharge: 2016-01-19 | Disposition: A | Discharge status: Home / Self Care | Payer: Medicare Other | Source: Ambulatory Visit | Attending: Hematology | Admitting: Hematology

## 2016-01-19 DIAGNOSIS — E2839 Other primary ovarian failure: Secondary | ICD-10-CM

## 2016-01-19 DIAGNOSIS — Z780 Asymptomatic menopausal state: Secondary | ICD-10-CM | POA: Diagnosis not present

## 2016-01-19 DIAGNOSIS — Z13820 Encounter for screening for osteoporosis: Secondary | ICD-10-CM | POA: Diagnosis not present

## 2016-01-20 DIAGNOSIS — H8113 Benign paroxysmal vertigo, bilateral: Secondary | ICD-10-CM | POA: Diagnosis not present

## 2016-02-29 DIAGNOSIS — D2372 Other benign neoplasm of skin of left lower limb, including hip: Secondary | ICD-10-CM | POA: Diagnosis not present

## 2016-03-02 DIAGNOSIS — H20021 Recurrent acute iridocyclitis, right eye: Secondary | ICD-10-CM | POA: Diagnosis not present

## 2016-03-03 DIAGNOSIS — Z23 Encounter for immunization: Secondary | ICD-10-CM | POA: Diagnosis not present

## 2016-03-03 DIAGNOSIS — M26622 Arthralgia of left temporomandibular joint: Secondary | ICD-10-CM | POA: Diagnosis not present

## 2016-03-03 DIAGNOSIS — B354 Tinea corporis: Secondary | ICD-10-CM | POA: Diagnosis not present

## 2016-03-22 ENCOUNTER — Other Ambulatory Visit: Payer: Self-pay | Admitting: General Practice

## 2016-03-22 MED ORDER — VESICARE 10 MG PO TABS: 10.0000 mg | ORAL_TABLET | Freq: Every day | ORAL | 1 refills | Status: DC

## 2016-04-24 DIAGNOSIS — H1033 Unspecified acute conjunctivitis, bilateral: Secondary | ICD-10-CM | POA: Diagnosis not present

## 2016-04-26 ENCOUNTER — Telehealth: Payer: Self-pay

## 2016-04-26 NOTE — Telephone Encounter (Signed)
LM requesting return call regarding AWV appointment. Would like pt to see health coach on 11/29 at 0900, then CPE with PCP at 10am.

## 2016-05-01 ENCOUNTER — Encounter: Payer: Self-pay | Admitting: Physician Assistant

## 2016-05-01 ENCOUNTER — Ambulatory Visit (INDEPENDENT_AMBULATORY_CARE_PROVIDER_SITE_OTHER): Payer: Medicare Other | Admitting: Physician Assistant

## 2016-05-01 VITALS — BP 130/80 | HR 85 | Temp 99.4°F | Resp 16 | Ht 62.0 in | Wt 227.0 lb

## 2016-05-01 DIAGNOSIS — B9689 Other specified bacterial agents as the cause of diseases classified elsewhere: Secondary | ICD-10-CM

## 2016-05-01 DIAGNOSIS — R062 Wheezing: Secondary | ICD-10-CM | POA: Diagnosis not present

## 2016-05-01 DIAGNOSIS — J208 Acute bronchitis due to other specified organisms: Secondary | ICD-10-CM

## 2016-05-01 MED ORDER — METHYLPREDNISOLONE 4 MG PO TBPK: ORAL_TABLET | ORAL | 0 refills | Status: DC

## 2016-05-01 MED ORDER — ALBUTEROL SULFATE (2.5 MG/3ML) 0.083% IN NEBU
2.5000 mg | INHALATION_SOLUTION | Freq: Once | RESPIRATORY_TRACT | Status: AC
  Administered 2016-05-01: 2.5 mg via RESPIRATORY_TRACT

## 2016-05-01 MED ORDER — AZITHROMYCIN 250 MG PO TABS: ORAL_TABLET | ORAL | 0 refills | Status: DC

## 2016-05-01 MED ORDER — BENZONATATE 100 MG PO CAPS: 100.0000 mg | ORAL_CAPSULE | Freq: Three times a day (TID) | ORAL | 0 refills | Status: DC | PRN

## 2016-05-01 NOTE — Progress Notes (Signed)
Pre visit review using our clinic review tool, if applicable. No additional management support is needed unless otherwise documented below in the visit note. 

## 2016-05-01 NOTE — Patient Instructions (Signed)
Take antibiotic (Azithromycin) as directed.  Increase fluids.  Get plenty of rest. Use Mucinex for congestion. Use steroid and tessalon as directed. Take a daily probiotic (I recommend Align or Culturelle, but even Activia Yogurt may be beneficial).  A humidifier placed in the bedroom may offer some relief for a dry, scratchy throat of nasal irritation.  Read information below on acute bronchitis.   Your lungs sound clear today. That being said, if fever is not resolving with antibiotics (48 hours) or if anything worsens or is not improving, we would need a chest x-ray to further assess.   Please call or return to clinic if symptoms are not improving.  Acute Bronchitis Bronchitis is when the airways that extend from the windpipe into the lungs get red, puffy, and painful (inflamed). Bronchitis often causes thick spit (mucus) to develop. This leads to a cough. A cough is the most common symptom of bronchitis. In acute bronchitis, the condition usually begins suddenly and goes away over time (usually in 2 weeks). Smoking, allergies, and asthma can make bronchitis worse. Repeated episodes of bronchitis may cause more lung problems.  HOME CARE  Rest.  Drink enough fluids to keep your pee (urine) clear or pale yellow (unless you need to limit fluids as told by your doctor).  Only take over-the-counter or prescription medicines as told by your doctor.  Avoid smoking and secondhand smoke. These can make bronchitis worse. If you are a smoker, think about using nicotine gum or skin patches. Quitting smoking will help your lungs heal faster.  Reduce the chance of getting bronchitis again by:  Washing your hands often.  Avoiding people with cold symptoms.  Trying not to touch your hands to your mouth, nose, or eyes.  Follow up with your doctor as told.  GET HELP IF: Your symptoms do not improve after 1 week of treatment. Symptoms include:  Cough.  Fever.  Coughing up thick spit.  Body  aches.  Chest congestion.  Chills.  Shortness of breath.  Sore throat.  GET HELP RIGHT AWAY IF:   You have an increased fever.  You have chills.  You have severe shortness of breath.  You have bloody thick spit (sputum).  You throw up (vomit) often.  You lose too much body fluid (dehydration).  You have a severe headache.  You faint.  MAKE SURE YOU:   Understand these instructions.  Will watch your condition.  Will get help right away if you are not doing well or get worse. Document Released: 11/08/2007 Document Revised: 01/22/2013 Document Reviewed: 11/12/2012 [facility] Patient Information 2015 [place], [place]. This information is not intended to replace advice given to you by your health care provider. Make sure you discuss any questions you have with your health care provider.

## 2016-05-01 NOTE — Progress Notes (Signed)
Patient presents to clinic today c/o 3 weeks of respiratory symptoms, starting as an intermittent sore throat with PND and nasal congestion. Since that time, patient has noted gradual worsening of symptoms. Now with significant chest congestion, cough productive of yellow sputums, chest tightness with wheezing. Endorses fever starting last night at T max 99.9. Denies chest pain. Patient without history of asthma or COPD. Is not a smoker. Denies recent travel or sick contact.   Past Medical History:  Diagnosis Date  . Anxiety   . Cancer ([place]) 10/04/2006   breast  . Depression   . Diverticulosis of colon   . OSA on CPAP     Current Outpatient Prescriptions on File Prior to Visit  Medication Sig Dispense Refill  . anastrozole (ARIMIDEX) 1 MG tablet Take 1 mg by mouth daily.  4  . Ascorbic Acid (VITAMIN C ER PO) Take 1 tablet by mouth daily.     Marland Kitchen aspirin 81 MG tablet Take 81 mg by mouth daily.    . AZOPT 1 % ophthalmic suspension PLACE 1 DROP INTO BOTH EYES TWICE DAILY  12  . b complex vitamins tablet Take 1 tablet by mouth daily.    Marland Kitchen escitalopram (LEXAPRO) 20 MG tablet Take 30 mg by mouth daily.  3  . levothyroxine (SYNTHROID, LEVOTHROID) 75 MCG tablet Take 75 mcg by mouth daily.  2  . Multiple Vitamins-Minerals (MULTIVITAMIN ADULTS 50+ PO) Take 1 tablet by mouth daily.     . psyllium (METAMUCIL) 58.6 % powder Take 1 packet by mouth daily.     Marland Kitchen triamcinolone cream (KENALOG) 0.1 % Use as needed for bug bites.  10  . VESICARE 10 MG tablet Take 1 tablet (10 mg total) by mouth daily. 90 tablet 1   No current facility-administered medications on file prior to visit.     Allergies  Allergen Reactions  . Neulasta [Pegfilgrastim] Other (See Comments)    Raised lumps on legs, arms - had to be excised =  Happened every time pt received Neulasta.    Family History  Problem Relation Age of Onset  . CVA Mother   . Cancer Mother 57    breast cancer   . Leukemia Father   . Cancer Maternal  Aunt 50    breast cancer     Social History   Social History  . Marital status: Married    Spouse name: N/A  . Number of children: N/A  . Years of education: N/A   Social History Main Topics  . Smoking status: Former Smoker    Quit date: 11/03/1978  . Smokeless tobacco: Never Used  . Alcohol use No  . Drug use: No  . Sexual activity: Not Asked   Other Topics Concern  . None   Social History Narrative  . None   Review of Systems - See HPI.  All other ROS are negative.  BP 130/80   Pulse 85   Temp 99.4 F (37.4 C) (Oral)   Resp 16   Ht 5\' 2"  (1.575 m)   Wt 227 lb (103 kg)   SpO2 98%   BMI 41.52 kg/m   Physical Exam  Constitutional: She is oriented to person, place, and time and well-developed, well-nourished, and in no distress.  HENT:  Head: Normocephalic and atraumatic.  Right Ear: Tympanic membrane and external ear normal.  Left Ear: Tympanic membrane and external ear normal.  Nose: Nose normal.  Mouth/Throat: Uvula is midline, oropharynx is clear and moist and mucous  membranes are normal. No oropharyngeal exudate, posterior oropharyngeal edema, posterior oropharyngeal erythema or tonsillar abscesses.  TM within normal limits  Eyes: Conjunctivae are normal.  Neck: Neck supple.  Cardiovascular: Normal rate, regular rhythm, normal heart sounds and intact distal pulses.   Pulmonary/Chest: Effort normal. No respiratory distress. She has wheezes. She has no rales. She exhibits no tenderness.  Lymphadenopathy:    She has no cervical adenopathy.  Neurological: She is alert and oriented to person, place, and time.  Skin: Skin is warm and dry. No rash noted.  Psychiatric: Affect normal.  Vitals reviewed.  Assessment/Plan: 1. Wheezing Upper airway. Albuterol nebulizer given. Will start Medrol dose pack to help with symptoms. - albuterol (PROVENTIL) (2.5 MG/3ML) 0.083% nebulizer solution 2.5 mg; Take 3 mLs (2.5 mg total) by nebulization once. - methylPREDNISolone  (MEDROL DOSEPAK) 4 MG TBPK tablet; Take following package directions  Dispense: 21 tablet; Refill: 0  2. Acute bacterial bronchitis Lung exam with slight wheezing. No rales or decreased breath sounds noted. Low-grade fever. Will start Azithromycin, Tessalon and Medrol. Supportive measures reviewed. Patient has CPE on Wednesday. Discussed if fever not resolved, despite clear lungs, would want CXR.  - azithromycin (ZITHROMAX) 250 MG tablet; Take 2 tablets on Day 1. Then take 1 tablet daily.  Dispense: 6 tablet; Refill: 0 - benzonatate (TESSALON) 100 MG capsule; Take 1 capsule (100 mg total) by mouth 3 (three) times daily as needed.  Dispense: 30 capsule; Refill: 0 - methylPREDNISolone (MEDROL DOSEPAK) 4 MG TBPK tablet; Take following package directions  Dispense: 21 tablet; Refill: 0    Leeanne Rio, [place]

## 2016-05-02 NOTE — Progress Notes (Signed)
Subjective:   Vanessa Christensen is a 65 y.o. female who presents for an Initial Medicare Annual Wellness Visit.  The Patient was informed that the wellness visit is to identify future health risk and educate and initiate measures that can reduce risk for increased disease through the lifespan.   Describes health as fair, good or great? "pretty great" Retired. Enjoys charity work and church group activities.   Review of Systems    No ROS.  Medicare Wellness Visit.  Cardiac Risk Factors include: advanced age (>33men, >23 women);obesity (BMI >30kg/m2)   Sleep patterns:  Sleeps 8 hours, up 2-3 x to void.  Home Safety/Smoke Alarms:  Smoke detectors and security in place.  Living environment; residence and Firearm Safety: Lives with husband in 1.5 story home (rails in place), pets in home. Family lives close. Firearms locked.  Seat Belt Safety/Bike Helmet: Wears seat belt.    Counseling:   Eye Exam-Last exam 03/2016, followed every 6 months by Dr. Prudencio Christensen  Dental-Last exam 11/2015, appt next month, followed every 6 months Dr. Orlando Christensen.  Female:   Pap-01/24/2010       Mammo-H/O Breast Ca, Bilateral mastectomy       Dexa scan-01/19/16, normal.       CCS-colonoscopy 04/27/2010, diverticulosis. Patient reports recall of 5 years, referral placed.       Objective:    Today's Vitals   05/03/16 0913  BP: 138/78  Pulse: 87  SpO2: 98%  Weight: 223 lb (101.2 kg)  Height: 5\' 2"  (1.575 m)   Body mass index is 40.79 kg/m.   Current Medications (verified) Outpatient Encounter Prescriptions as of 05/03/2016  Medication Sig  . anastrozole (ARIMIDEX) 1 MG tablet Take 1 mg by mouth daily.  . Ascorbic Acid (VITAMIN C ER PO) Take 1 tablet by mouth daily.   Marland Kitchen aspirin 81 MG tablet Take 81 mg by mouth daily.  Marland Kitchen azithromycin (ZITHROMAX) 250 MG tablet Take 2 tablets on Day 1. Then take 1 tablet daily.  . AZOPT 1 % ophthalmic suspension PLACE 1 DROP INTO BOTH EYES TWICE DAILY  . b complex vitamins  tablet Take 1 tablet by mouth daily.  . benzonatate (TESSALON) 100 MG capsule Take 1 capsule (100 mg total) by mouth 3 (three) times daily as needed.  Marland Kitchen escitalopram (LEXAPRO) 20 MG tablet Take 30 mg by mouth daily.  Marland Kitchen levothyroxine (SYNTHROID, LEVOTHROID) 75 MCG tablet Take 75 mcg by mouth daily.  . methylPREDNISolone (MEDROL DOSEPAK) 4 MG TBPK tablet Take following package directions  . Multiple Vitamins-Minerals (MULTIVITAMIN ADULTS 50+ PO) Take 1 tablet by mouth daily.   . psyllium (METAMUCIL) 58.6 % powder Take 1 packet by mouth daily.   Marland Kitchen triamcinolone cream (KENALOG) 0.1 % Use as needed for bug bites.  . VESICARE 10 MG tablet Take 1 tablet (10 mg total) by mouth daily.   No facility-administered encounter medications on file as of 05/03/2016.     Allergies (verified) Neulasta [pegfilgrastim]   History: Past Medical History:  Diagnosis Date  . Anxiety   . Cancer ([place]) 10/04/2006   breast  . Depression   . Diverticulosis of colon   . OSA on CPAP    Past Surgical History:  Procedure Laterality Date  . BREAST SURGERY Bilateral   . CATARACT EXTRACTION    . GLAUCOMA REPAIR    . MASTECTOMY, RADICAL Bilateral   . neulasta induced sterile abscesses    . THYROIDECTOMY, PARTIAL    . TOTAL KNEE ARTHROPLASTY Right    Family  History  Problem Relation Age of Onset  . CVA Mother   . Cancer Mother 29    breast cancer   . Leukemia Father   . Cancer Maternal Aunt 63    breast cancer    Social History   Occupational History  . Not on file.   Social History Main Topics  . Smoking status: Former Smoker    Quit date: 11/03/1978  . Smokeless tobacco: Never Used  . Alcohol use No  . Drug use: No  . Sexual activity: Not on file    Tobacco Counseling Counseling given: Not Answered   Activities of Daily Living In your present state of health, do you have any difficulty performing the following activities: 05/03/2016 12/02/2015  Hearing? N N  Vision? N Y  Difficulty  concentrating or making decisions? N N  Walking or climbing stairs? Y N  Dressing or bathing? N N  Doing errands, shopping? N N  Preparing Food and eating ? N -  Using the Toilet? N -  In the past six months, have you accidently leaked urine? Y -  Do you have problems with loss of bowel control? N -  Managing your Medications? N -  Managing your Finances? N -  Housekeeping or managing your Housekeeping? N -    Immunizations and Health Maintenance Immunization History  Administered Date(s) Administered  . Tdap 12/01/2013   Health Maintenance Due  Topic Date Due  . Hepatitis C Screening  10-03-1950    Patient Care Team: Vanessa Minium, MD as PCP - General (Family Medicine) Vanessa Apo, MD as Consulting Physician (Ophthalmology) Vanessa Merle, MD as Consulting Physician (Hematology) Vanessa Christensen (Dentistry)  Indicate any recent Medical Services you may have received from other than [facility] providers in the past year (date may be approximate).     Assessment:   This is a routine wellness examination for Vanessa Christensen. Physical assessment deferred to PCP.   Hearing/Vision screen Hearing Screening Comments: Able to hear conversational tones w/o difficulty. No issues reported.   Vision Screening Comments: H/O Cataract surgery, wears corrective lenses to 20/20.   Dietary issues and exercise activities discussed: Current Exercise Habits: Structured exercise class, Type of exercise: strength training/weights;walking, Time (Minutes): 60, Frequency (Times/Week): 3, Weekly Exercise (Minutes/Week): 180, Intensity: Intense, Exercise limited by: orthopedic condition(s)   Diet (meal preparation, eat out, water intake, caffeinated beverages, dairy products, fruits and vegetables): Eats out half meals. Drinks 80 oz water/day. Unsweet tea.   Breakfast: oats, fruit, cereal, coffee (1 cup) Lunch: sandwich, salad Dinner: [place] bro[place], sweet potato, spinach  Encouraged to continue to make healthy food  choices and work out sessions with Physiological scientist.   Goals    . Weight (lb) < 185 lb (83.9 kg)          Will increase exercise throughout the week.       Depression Screen PHQ 2/9 Scores 05/03/2016 12/02/2015  PHQ - 2 Score 0 0    Fall Risk Fall Risk  05/03/2016 12/02/2015  Falls in the past year? No No    Cognitive Function:       Ad8 score reviewed for issues:  Issues making decisions:no  Less interest in hobbies / activities:no  Repeats questions, stories (family complaining):no  Trouble using ordinary gadgets (microwave, computer, phone):no  Forgets the month or year: no  Mismanaging finances: no  Remembering appts:no  Daily problems with thinking and/or memory: no Ad8 score is=0     Screening Tests Health Maintenance  Topic Date  Due  . Hepatitis C Screening  1950-07-27  . PNA vac Low Risk Adult (2 of 2 - PPSV23) 03/05/2017  . COLONOSCOPY  04/27/2020  . TETANUS/TDAP  12/02/2023  . INFLUENZA VACCINE  Addressed  . DEXA SCAN  Completed  . ZOSTAVAX  Addressed      Plan:     Continue to eat heart healthy diet (full of fruits, vegetables, whole grains, lean protein, water--limit salt, fat, and sugar intake) and increase physical activity as tolerated.  Continue doing brain stimulating activities (puzzles, reading, adult coloring books, staying active) to keep memory sharp.   Bring a copy of your advance directives to your next office visit.   During the course of the visit, Rasheen was educated and counseled about the following appropriate screening and preventive services:   Vaccines to include Pneumoccal, Influenza, Hepatitis B, Td, Zostavax, HCV  Cardiovascular disease screening  Colorectal cancer screening  Bone density screening  Diabetes screening  Glaucoma screening  Mammography/PAP  Nutrition counseling   Patient Instructions (the written plan) were given to the patient.    Gerilyn Nestle, RN   05/03/2016

## 2016-05-02 NOTE — Progress Notes (Signed)
Pre visit review using our clinic review tool, if applicable. No additional management support is needed unless otherwise documented below in the visit note. 

## 2016-05-03 ENCOUNTER — Encounter: Payer: Self-pay | Admitting: Family Medicine

## 2016-05-03 ENCOUNTER — Ambulatory Visit (INDEPENDENT_AMBULATORY_CARE_PROVIDER_SITE_OTHER): Payer: Medicare Other | Admitting: Family Medicine

## 2016-05-03 ENCOUNTER — Encounter: Payer: Self-pay | Admitting: General Practice

## 2016-05-03 VITALS — BP 138/78 | HR 87 | Ht 62.0 in | Wt 223.0 lb

## 2016-05-03 DIAGNOSIS — Z0000 Encounter for general adult medical examination without abnormal findings: Secondary | ICD-10-CM

## 2016-05-03 DIAGNOSIS — Z1211 Encounter for screening for malignant neoplasm of colon: Secondary | ICD-10-CM

## 2016-05-03 DIAGNOSIS — E038 Other specified hypothyroidism: Secondary | ICD-10-CM | POA: Diagnosis not present

## 2016-05-03 LAB — HEPATIC FUNCTION PANEL
ALBUMIN: 4.9 g/dL (ref 3.5–5.2)
ALK PHOS: 72 U/L (ref 39–117)
ALT: 20 U/L (ref 0–35)
AST: 17 U/L (ref 0–37)
Bilirubin, Direct: 0.1 mg/dL (ref 0.0–0.3)
Total Bilirubin: 0.5 mg/dL (ref 0.2–1.2)
Total Protein: 7.6 g/dL (ref 6.0–8.3)

## 2016-05-03 LAB — CBC WITH DIFFERENTIAL/PLATELET
Basophils Absolute: 0 10*3/uL (ref 0.0–0.1)
Basophils Relative: 0.5 % (ref 0.0–3.0)
EOS ABS: 0 10*3/uL (ref 0.0–0.7)
EOS PCT: 0 % (ref 0.0–5.0)
HCT: 38.4 % (ref 36.0–46.0)
HEMOGLOBIN: 12.8 g/dL (ref 12.0–15.0)
LYMPHS ABS: 1.4 10*3/uL (ref 0.7–4.0)
Lymphocytes Relative: 27.1 % (ref 12.0–46.0)
MCHC: 33.4 g/dL (ref 30.0–36.0)
MCV: 83.3 fl (ref 78.0–100.0)
MONO ABS: 0.6 10*3/uL (ref 0.1–1.0)
Monocytes Relative: 12.4 % — ABNORMAL HIGH (ref 3.0–12.0)
NEUTROS PCT: 60 % (ref 43.0–77.0)
Neutro Abs: 3 10*3/uL (ref 1.4–7.7)
Platelets: 229 10*3/uL (ref 150.0–400.0)
RBC: 4.61 Mil/uL (ref 3.87–5.11)
RDW: 15.6 % — AB (ref 11.5–15.5)
WBC: 5.1 10*3/uL (ref 4.0–10.5)

## 2016-05-03 LAB — BASIC METABOLIC PANEL
BUN: 21 mg/dL (ref 6–23)
CHLORIDE: 104 meq/L (ref 96–112)
CO2: 29 meq/L (ref 19–32)
CREATININE: 0.73 mg/dL (ref 0.40–1.20)
Calcium: 10 mg/dL (ref 8.4–10.5)
GFR: 84.41 mL/min (ref >60.00)
GLUCOSE: 95 mg/dL (ref 70–99)
POTASSIUM: 4.6 meq/L (ref 3.5–5.1)
Sodium: 141 mEq/L (ref 135–145)

## 2016-05-03 LAB — LIPID PANEL
CHOL/HDL RATIO: 4
CHOLESTEROL: 190 mg/dL (ref 0–200)
HDL: 52.9 mg/dL (ref >39.00)
LDL Cholesterol: 124 mg/dL — ABNORMAL HIGH (ref 0–99)
NonHDL: 137.09
TRIGLYCERIDES: 67 mg/dL (ref 0.0–149.0)
VLDL: 13.4 mg/dL (ref 0.0–40.0)

## 2016-05-03 LAB — TSH: TSH: 0.51 u[IU]/mL (ref 0.35–4.50)

## 2016-05-03 NOTE — Patient Instructions (Addendum)
Follow up in 6 months to recheck weight loss progress and thyroid We'll notify you of your lab results and make any changes if needed Continue to work on healthy diet and regular exercise- you are doing great! Call with any questions or concerns Happy Holidays!!!  Continue to eat heart healthy diet (full of fruits, vegetables, whole grains, lean protein, water--limit salt, fat, and sugar intake) and increase physical activity as tolerated.  Continue doing brain stimulating activities (puzzles, reading, adult coloring books, staying active) to keep memory sharp.   Bring a copy of your advance directives to your next office visit.   Fall Prevention in the Home Introduction Falls can cause injuries. They can happen to people of all ages. There are many things you can do to make your home safe and to help prevent falls. What can I do on the outside of my home?  Regularly fix the edges of walkways and driveways and fix any cracks.  Remove anything that might make you trip as you walk through a door, such as a raised step or threshold.  Trim any bushes or trees on the path to your home.  Use bright outdoor lighting.  Clear any walking paths of anything that might make someone trip, such as rocks or tools.  Regularly check to see if handrails are loose or broken. Make sure that both sides of any steps have handrails.  Any raised decks and porches should have guardrails on the edges.  Have any leaves, snow, or ice cleared regularly.  Use sand or salt on walking paths during winter.  Clean up any spills in your garage right away. This includes oil or grease spills. What can I do in the bathroom?  Use night lights.  Install grab bars by the toilet and in the tub and shower. Do not use towel bars as grab bars.  Use non-skid mats or decals in the tub or shower.  If you need to sit down in the shower, use a plastic, non-slip stool.  Keep the floor dry. Clean up any water that spills  on the floor as soon as it happens.  Remove soap buildup in the tub or shower regularly.  Attach bath mats securely with double-sided non-slip rug tape.  Do not have throw rugs and other things on the floor that can make you trip. What can I do in the bedroom?  Use night lights.  Make sure that you have a light by your bed that is easy to reach.  Do not use any sheets or blankets that are too big for your bed. They should not hang down onto the floor.  Have a firm chair that has side arms. You can use this for support while you get dressed.  Do not have throw rugs and other things on the floor that can make you trip. What can I do in the kitchen?  Clean up any spills right away.  Avoid walking on wet floors.  Keep items that you use a lot in easy-to-reach places.  If you need to reach something above you, use a strong step stool that has a grab bar.  Keep electrical cords out of the way.  Do not use floor polish or wax that makes floors slippery. If you must use wax, use non-skid floor wax.  Do not have throw rugs and other things on the floor that can make you trip. What can I do with my stairs?  Do not leave any items on the stairs.  Make sure that there are handrails on both sides of the stairs and use them. Fix handrails that are broken or loose. Make sure that handrails are as long as the stairways.  Check any carpeting to make sure that it is firmly attached to the stairs. Fix any carpet that is loose or worn.  Avoid having throw rugs at the top or bottom of the stairs. If you do have throw rugs, attach them to the floor with carpet tape.  Make sure that you have a light switch at the top of the stairs and the bottom of the stairs. If you do not have them, ask someone to add them for you. What else can I do to help prevent falls?  Wear shoes that:  Do not have high heels.  Have rubber bottoms.  Are comfortable and fit you well.  Are closed at the toe. Do  not wear sandals.  If you use a stepladder:  Make sure that it is fully opened. Do not climb a closed stepladder.  Make sure that both sides of the stepladder are locked into place.  Ask someone to hold it for you, if possible.  Clearly mark and make sure that you can see:  Any grab bars or handrails.  First and last steps.  Where the edge of each step is.  Use tools that help you move around (mobility aids) if they are needed. These include:  Canes.  Walkers.  Scooters.  Crutches.  Turn on the lights when you go into a dark area. Replace any light bulbs as soon as they burn out.  Set up your furniture so you have a clear path. Avoid moving your furniture around.  If any of your floors are uneven, fix them.  If there are any pets around you, be aware of where they are.  Review your medicines with your doctor. Some medicines can make you feel dizzy. This can increase your chance of falling. Ask your doctor what other things that you can do to help prevent falls. This information is not intended to replace advice given to you by your health care provider. Make sure you discuss any questions you have with your health care provider. Document Released: 03/18/2009 Document Revised: 10/28/2015 Document Reviewed: 06/26/2014  2017 [employer]  Health Maintenance, Female Introduction Adopting a healthy lifestyle and getting preventive care can go a long way to promote health and wellness. Talk with your health care provider about what schedule of regular examinations is right for you. This is a good chance for you to check in with your provider about disease prevention and staying healthy. In between checkups, there are plenty of things you can do on your own. Experts have done a lot of research about which lifestyle changes and preventive measures are most likely to keep you healthy. Ask your health care provider for more information. Weight and diet Eat a healthy diet  Be sure  to include plenty of vegetables, fruits, low-fat dairy products, and lean protein.  Do not eat a lot of foods high in solid fats, added sugars, or salt.  Get regular exercise. This is one of the most important things you can do for your health.  Most adults should exercise for at least 150 minutes each week. The exercise should increase your heart rate and make you sweat (moderate-intensity exercise).  Most adults should also do strengthening exercises at least twice a week. This is in addition to the moderate-intensity exercise. Maintain a healthy weight  Body mass index (BMI) is a measurement that can be used to identify possible weight problems. It estimates body fat based on height and weight. Your health care provider can help determine your BMI and help you achieve or maintain a healthy weight.  For females 44 years of age and older:  A BMI below 18.5 is considered underweight.  A BMI of 18.5 to 24.9 is normal.  A BMI of 25 to 29.9 is considered overweight.  A BMI of 30 and above is considered obese. Watch levels of cholesterol and blood lipids  You should start having your blood tested for lipids and cholesterol at 67 years of age, then have this test every 5 years.  You may need to have your cholesterol levels checked more often if:  Your lipid or cholesterol levels are high.  You are older than 67 years of age.  You are at high risk for heart disease. Cancer screening Lung Cancer  Lung cancer screening is recommended for adults 86-53 years old who are at high risk for lung cancer because of a history of smoking.  A yearly low-dose CT scan of the lungs is recommended for people who:  Currently smoke.  Have quit within the past 15 years.  Have at least a 30-pack-year history of smoking. A pack year is smoking an average of one pack of cigarettes a day for 1 year.  Yearly screening should continue until it has been 15 years since you quit.  Yearly screening  should stop if you develop a health problem that would prevent you from having lung cancer treatment. Breast Cancer  Practice breast self-awareness. This means understanding how your breasts normally appear and feel.  It also means doing regular breast self-exams. Let your health care provider know about any changes, no matter how small.  If you are in your 20s or 30s, you should have a clinical breast exam (CBE) by a health care provider every 1-3 years as part of a regular health exam.  If you are 56 or older, have a CBE every year. Also consider having a breast X-ray (mammogram) every year.  If you have a family history of breast cancer, talk to your health care provider about genetic screening.  If you are at high risk for breast cancer, talk to your health care provider about having an MRI and a mammogram every year.  Breast cancer gene (BRCA) assessment is recommended for women who have family members with BRCA-related cancers. BRCA-related cancers include:  Breast.  Ovarian.  Tubal.  Peritoneal cancers.  Results of the assessment will determine the need for genetic counseling and BRCA1 and BRCA2 testing. Cervical Cancer  Your health care provider may recommend that you be screened regularly for cancer of the pelvic organs (ovaries, uterus, and vagina). This screening involves a pelvic examination, including checking for microscopic changes to the surface of your cervix (Pap test). You may be encouraged to have this screening done every 3 years, beginning at age 76.  For women ages 36-65, health care providers may recommend pelvic exams and Pap testing every 3 years, or they may recommend the Pap and pelvic exam, combined with testing for human papilloma virus (HPV), every 5 years. Some types of HPV increase your risk of cervical cancer. Testing for HPV may also be done on women of any age with unclear Pap test results.  Other health care providers may not recommend any screening  for nonpregnant women who are considered low risk for pelvic cancer  and who do not have symptoms. Ask your health care provider if a screening pelvic exam is right for you.  If you have had past treatment for cervical cancer or a condition that could lead to cancer, you need Pap tests and screening for cancer for at least 20 years after your treatment. If Pap tests have been discontinued, your risk factors (such as having a new sexual partner) need to be reassessed to determine if screening should resume. Some women have medical problems that increase the chance of getting cervical cancer. In these cases, your health care provider may recommend more frequent screening and Pap tests. Colorectal Cancer  This type of cancer can be detected and often prevented.  Routine colorectal cancer screening usually begins at 67 years of age and continues through 67 years of age.  Your health care provider may recommend screening at an earlier age if you have risk factors for colon cancer.  Your health care provider may also recommend using home test kits to check for hidden blood in the stool.  A small camera at the end of a tube can be used to examine your colon directly (sigmoidoscopy or colonoscopy). This is done to check for the earliest forms of colorectal cancer.  Routine screening usually begins at age 14.  Direct examination of the colon should be repeated every 5-10 years through 67 years of age. However, you may need to be screened more often if early forms of precancerous polyps or small growths are found. Skin Cancer  Check your skin from head to toe regularly.  Tell your health care provider about any new moles or changes in moles, especially if there is a change in a mole's shape or color.  Also tell your health care provider if you have a mole that is larger than the size of a pencil eraser.  Always use sunscreen. Apply sunscreen liberally and repeatedly throughout the day.  Protect  yourself by wearing long sleeves, pants, a wide-brimmed hat, and sunglasses whenever you are outside. Heart disease, diabetes, and high blood pressure  High blood pressure causes heart disease and increases the risk of stroke. High blood pressure is more likely to develop in:  People who have blood pressure in the high end of the normal range (130-139/85-89 mm Hg).  People who are overweight or obese.  People who are African American.  If you are 58-61 years of age, have your blood pressure checked every 3-5 years. If you are 66 years of age or older, have your blood pressure checked every year. You should have your blood pressure measured twice-once when you are at a hospital or clinic, and once when you are not at a hospital or clinic. Record the average of the two measurements. To check your blood pressure when you are not at a hospital or clinic, you can use:  An automated blood pressure machine at a pharmacy.  A home blood pressure monitor.  If you are between 37 years and 19 years old, ask your health care provider if you should take aspirin to prevent strokes.  Have regular diabetes screenings. This involves taking a blood sample to check your fasting blood sugar level.  If you are at a normal weight and have a low risk for diabetes, have this test once every three years after 67 years of age.  If you are overweight and have a high risk for diabetes, consider being tested at a younger age or more often. Preventing infection Hepatitis B  If you have a higher risk for hepatitis B, you should be screened for this virus. You are considered at high risk for hepatitis B if:  You were born in a country where hepatitis B is common. Ask your health care provider which countries are considered high risk.  Your parents were born in a high-risk country, and you have not been immunized against hepatitis B (hepatitis B vaccine).  You have HIV or AIDS.  You use needles to inject street  drugs.  You live with someone who has hepatitis B.  You have had sex with someone who has hepatitis B.  You get hemodialysis treatment.  You take certain medicines for conditions, including cancer, organ transplantation, and autoimmune conditions. Hepatitis C  Blood testing is recommended for:  Everyone born from [birth year] through 1967.  Anyone with known risk factors for hepatitis C. Sexually transmitted infections (STIs)  You should be screened for sexually transmitted infections (STIs) including gonorrhea and chlamydia if:  You are sexually active and are younger than 67 years of age.  You are older than 67 years of age and your health care provider tells you that you are at risk for this type of infection.  Your sexual activity has changed since you were last screened and you are at an increased risk for chlamydia or gonorrhea. Ask your health care provider if you are at risk.  If you do not have HIV, but are at risk, it may be recommended that you take a prescription medicine daily to prevent HIV infection. This is called pre-exposure prophylaxis (PrEP). You are considered at risk if:  You are sexually active and do not regularly use condoms or know the HIV status of your partner(s).  You take drugs by injection.  You are sexually active with a partner who has HIV. Talk with your health care provider about whether you are at high risk of being infected with HIV. If you choose to begin PrEP, you should first be tested for HIV. You should then be tested every 3 months for as long as you are taking PrEP. Pregnancy  If you are premenopausal and you may become pregnant, ask your health care provider about preconception counseling.  If you may become pregnant, take 400 to 800 micrograms (mcg) of folic acid every day.  If you want to prevent pregnancy, talk to your health care provider about birth control (contraception). Osteoporosis and menopause  Osteoporosis is a disease in  which the bones lose minerals and strength with aging. This can result in serious bone fractures. Your risk for osteoporosis can be identified using a bone density scan.  If you are 57 years of age or older, or if you are at risk for osteoporosis and fractures, ask your health care provider if you should be screened.  Ask your health care provider whether you should take a calcium or vitamin D supplement to lower your risk for osteoporosis.  Menopause may have certain physical symptoms and risks.  Hormone replacement therapy may reduce some of these symptoms and risks. Talk to your health care provider about whether hormone replacement therapy is right for you. Follow these instructions at home:  Schedule regular health, dental, and eye exams.  Stay current with your immunizations.  Do not use any tobacco products including cigarettes, chewing tobacco, or electronic cigarettes.  If you are pregnant, do not drink alcohol.  If you are breastfeeding, limit how much and how often you drink alcohol.  Limit alcohol intake to no  more than 1 drink per day for nonpregnant women. One drink equals 12 ounces of beer, 5 ounces of wine, or 1 ounces of hard liquor.  Do not use street drugs.  Do not share needles.  Ask your health care provider for help if you need support or information about quitting drugs.  Tell your health care provider if you often feel depressed.  Tell your health care provider if you have ever been abused or do not feel safe at home. This information is not intended to replace advice given to you by your health care provider. Make sure you discuss any questions you have with your health care provider. Document Released: 12/05/2010 Document Revised: 10/28/2015 Document Reviewed: 02/23/2015  2017 [employer]

## 2016-05-03 NOTE — Progress Notes (Signed)
   Subjective:    Patient ID: Vanessa Christensen, female    DOB: 1951-04-20, 65 y.o.   MRN: [id number]  HPI Hypothyroid- chronic problem, on Levothyroxine.  Denies fatigue, changes to skin/hair/nails, diarrhea/constipation.  Obesity- pt's BMI is now 40.79.  She has lost 4 lbs since last visit 2 days ago.  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  Bronchitis- pt was seen 2 days ago and started on Zpack, tessalon for cough and Medrol dose pack.  Pt reports feeling better from 2 days ago.     Review of Systems For ROS see HPI     Objective:   Physical Exam  Constitutional: She is oriented to person, place, and time. She appears well-developed and well-nourished. No distress.  obese  HENT:  Head: Normocephalic and atraumatic.  laryngitis  Eyes: Conjunctivae and EOM are normal. Pupils are equal, round, and reactive to light.  Neck: Normal range of motion. Neck supple. No thyromegaly present.  Cardiovascular: Normal rate, regular rhythm, normal heart sounds and intact distal pulses.   No murmur heard. Pulmonary/Chest: Effort normal and breath sounds normal. No respiratory distress.  Abdominal: Soft. She exhibits no distension. There is no tenderness.  Musculoskeletal: She exhibits no edema.  Lymphadenopathy:    She has no cervical adenopathy.  Neurological: She is alert and oriented to person, place, and time. She has normal reflexes. No cranial nerve deficit. Coordination normal.  Skin: Skin is warm and dry.  Psychiatric: She has a normal mood and affect. Her behavior is normal.  Vitals reviewed.         Assessment & Plan:

## 2016-05-03 NOTE — Assessment & Plan Note (Signed)
Chronic problem.  Currently asymptomatic.  Check labs.  Adjust meds prn  

## 2016-05-03 NOTE — Assessment & Plan Note (Signed)
Ongoing issue.  Pt is losing weight with her regular exercise and better food choices.  Applauded her efforts.  Check labs to risk stratify.  Will follow.

## 2016-05-18 ENCOUNTER — Other Ambulatory Visit: Payer: Self-pay | Admitting: Family Medicine

## 2016-05-19 ENCOUNTER — Encounter: Payer: Self-pay | Admitting: Family Medicine

## 2016-05-19 ENCOUNTER — Telehealth: Payer: Self-pay | Admitting: Internal Medicine

## 2016-05-19 ENCOUNTER — Ambulatory Visit (INDEPENDENT_AMBULATORY_CARE_PROVIDER_SITE_OTHER): Payer: Medicare Other | Admitting: Family Medicine

## 2016-05-19 VITALS — BP 130/80 | HR 68 | Temp 98.1°F | Resp 17 | Ht 62.0 in | Wt 231.2 lb

## 2016-05-19 DIAGNOSIS — M62838 Other muscle spasm: Secondary | ICD-10-CM | POA: Diagnosis not present

## 2016-05-19 MED ORDER — TIZANIDINE HCL 4 MG PO TABS: 4.0000 mg | ORAL_TABLET | Freq: Three times a day (TID) | ORAL | 0 refills | Status: DC | PRN

## 2016-05-19 MED ORDER — MELOXICAM 7.5 MG PO TABS: 7.5000 mg | ORAL_TABLET | Freq: Every day | ORAL | 1 refills | Status: DC

## 2016-05-19 NOTE — Patient Instructions (Signed)
Follow up as needed/scheduled Start the once daily Meloxicam for inflammation- take w/ food Use the Tizanidine up to 3x/day as needed for spasm- will cause drowsiness Heat or ice whichever feels better Do some gentle stretching to improve range of motion Call with any questions or concerns- particularly if not improving Hang in there!!! Happy Holidays!!!

## 2016-05-19 NOTE — Progress Notes (Signed)
Pre visit review using our clinic review tool, if applicable. No additional management support is needed unless otherwise documented below in the visit note. 

## 2016-05-19 NOTE — Progress Notes (Signed)
   Subjective:    Patient ID: Vanessa Christensen, female    DOB: May 30, 1951, 65 y.o.   MRN: [id number]  HPI Neck pain- pt was spending the night with her newborn grandchild and well asleep in a very awkward position on the couch on Tuesday.  Developed neck pain on Wednesday and now having difficulty moving neck from side to side or extending.  'it feels like a giant toothache', L side > R side.  Pain started at base of skull and radiated downward.   Review of Systems For ROS see HPI     Objective:   Physical Exam  Constitutional: She is oriented to person, place, and time. She appears well-developed and well-nourished. No distress.  HENT:  Head: Normocephalic and atraumatic.  Neck:  Bilateral trap spasm Very limited neck flexion/extenstion w/ limited rotation  Neurological: She is alert and oriented to person, place, and time. She has normal reflexes. No cranial nerve deficit.  Skin: Skin is warm and dry.  Psychiatric: She has a normal mood and affect. Her behavior is normal. Thought content normal.  Vitals reviewed.         Assessment & Plan:  Trap spasm- bilateral due to odd sleeping position.  Limited ROM but no red flags on hx or PE.  Start Tizanidine and low dose Meloxicam.  Reviewed supportive care and red flags that should prompt return.  Pt expressed understanding and is in agreement w/ plan.

## 2016-05-25 ENCOUNTER — Encounter: Payer: Self-pay | Admitting: Internal Medicine

## 2016-06-20 ENCOUNTER — Other Ambulatory Visit: Payer: Self-pay | Admitting: General Practice

## 2016-06-20 MED ORDER — SOLIFENACIN SUCCINATE 10 MG PO TABS: 10.0000 mg | ORAL_TABLET | Freq: Every day | ORAL | 1 refills | Status: DC

## 2016-07-06 ENCOUNTER — Ambulatory Visit (AMBULATORY_SURGERY_CENTER): Payer: Self-pay

## 2016-07-06 VITALS — Ht 62.0 in | Wt 232.4 lb

## 2016-07-06 DIAGNOSIS — Z1211 Encounter for screening for malignant neoplasm of colon: Secondary | ICD-10-CM

## 2016-07-06 MED ORDER — SUPREP BOWEL PREP KIT 17.5-3.13-1.6 GM/177ML PO SOLN: 1.0000 | Freq: Once | ORAL | 0 refills | Status: AC

## 2016-07-06 NOTE — Progress Notes (Signed)
No allergies to eggs or soy No past problems with anesthesia No diet meds No home oxygen  Declined emmi 

## 2016-07-07 ENCOUNTER — Telehealth: Payer: Self-pay | Admitting: General Practice

## 2016-07-07 MED ORDER — TOLTERODINE TARTRATE ER 4 MG PO CP24: 4.0000 mg | ORAL_CAPSULE | Freq: Every day | ORAL | 6 refills | Status: DC

## 2016-07-07 NOTE — Telephone Encounter (Signed)
Pt dropped off a letter from her insurance company that shows that they prefer the pt to be on either tolterodine, trospium, oxybutynin, and oxybutynin ext- rel.   Per PCP tolterodine 4mg  #30 with 6 was filled to pharmacy and pt made aware.

## 2016-07-11 NOTE — Progress Notes (Signed)
[facility]  Telephone:(336) [phone] Fax:(336) [phone]  Clinic Follow Up Note   Patient Care Team: [name], MD as PCP - General (Family Medicine) Katy Apo, MD as Consulting Physician (Ophthalmology) Truitt Merle, MD as Consulting Physician (Hematology) [place] (Dentistry) 07/13/2016  CHIEF COMPLAINTS:  Follow up left breast cancer   Oncology History   Breast cancer of upper-outer quadrant of left female breast [facility])   Staging form: Breast, AJCC 7th Edition   - Pathologic stage from 10/31/2006: Stage IIB (T2, N80m, cM0) - Signed by YTruitt Merle MD on 01/11/2016      Breast cancer of upper-outer quadrant of left female breast ([place]   09/25/2006 Initial Biopsy    Left breast 2:00 position mass core needle biopsy showed invasive carcinoma with overlapping features of lobular and ductal carcinoma, grade 2.      09/25/2006 Receptors her2    ER 3+ positive, PR 3+ positive, HER-2 equivocal on IHC, [facility] of [facility]      09/25/2006 Initial Diagnosis    Breast cancer of upper-outer quadrant of left female breast ([place]      10/01/2006 Miscellaneous    BRCA1 and BRCA2 gene sequencing was negative for mutation.      10/31/2006 Surgery    Bilateral breast mastectomy and left sentinel lymph node biopsy      10/31/2006 Pathology Results    Left breast mastectomy showed 3.9 cm invasive lobular carcinoma, grade 2, surgical margins were negative, will follow 2 lymph nodes had microscopic metastasis, 2 axillary node were negative.      01/10/2007 - 08/08/2007 Chemotherapy    Adriamycin 1224mand Cytoxan 120028mvery 4 weeks for 4 cycles, followed by docetaxel 150m54mr 2 cycles.         10/2007 -  Anti-estrogen oral therapy    Adjuvant anastrozole 1mg 8mly       09/09/2008 Imaging    PET scan showed no evidence of recurrence or metastatic breast cancer.      01/19/2016 Imaging    Bone Density Scan The BMD  measured at Femur Neck Right is 0.973 g/cm2 with a T-score of -0.5.       HISTORY OF PRESENTING ILLNESS:  Vanessa Christensen.26 female is here because of Her history of left breast cancer. She was referred by her primary care physician to transfer her oncological care to us[place] She presents to the clinic by herself today.  She was diagnosed in 09/2006, and underwent left breast mastectomy and sentinel lymph node biopsy on 10/31/2006, which showed a 3.9 cm invasive lobular carcinoma, and a microscopic metastasis in 1 of the sentinel lymph nodes. She also had right prophylactic mastectomy on the same day. She initially planned to have reconstruction, however she had wound infection after surgery, and reconstruction was canceled. She had adjuvant chemotherapy with Adriamycin, Cytoxan and docetaxel, for which she completed in March 2009. She subsequently started anastrozole, has been tolerating well for the past 9 years. She has mild hot flush, no other noticeable side effects from anastrozole.  She feels well overall. She has osteoarthritis, had right total knee replacement, and has mild arthralgia the other joints, which is tolerable. She is very physically active, has a persoPhysiological scientistexercise including lifting etc at least 3 hours a week.  She has changed her diet lately and lost about 8lbs in the past few weeks. She  is married, lives with her husband. They moved to [place] several months ago to be closer to their daughter. She is retired.   CURRENT THERAPY: Anastrozole 1 mg daily, started in May 2008  INTERIM HISTORY: She returns for follow up. She has been doing great. 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 She has her next colonoscopy coming up. She has been having recurring pain with a burning sensation that comes and goes on the outside of her  right calf. She has some occasional right breast pain, but she says she doesn't feel a mass. Denies any other concerns. She is still taking anastrozole. She is scheduled to stop taking it in May, but she is scared that she'll get cancer if she stops.   MEDICAL HISTORY:  Past Medical History:  Diagnosis Date  . Anxiety    pt denies  . Arthritis    hands and feet  . Cancer ([place]) 10/04/2006   breast  . Cataracts, both eyes   . Depression   . Diverticulosis of colon   . Glaucoma   . Heart murmur   . OSA on CPAP   . Sleep apnea   . Thyroid activity decreased     SURGICAL HISTORY: Past Surgical History:  Procedure Laterality Date  . BREAST SURGERY Bilateral   . CATARACT EXTRACTION    . GLAUCOMA REPAIR    . MASTECTOMY, RADICAL Bilateral   . neulasta induced sterile abscesses    . THYROIDECTOMY, PARTIAL    . TOTAL KNEE ARTHROPLASTY Right     SOCIAL HISTORY: Social History   Social History  . Marital status: Married    Spouse name: N/A  . Number of children: N/A  . Years of education: N/A   Occupational History  . Not on file.   Social History Main Topics  . Smoking status: Former Smoker    Quit date: 11/03/1978  . Smokeless tobacco: Never Used  . Alcohol use No  . Drug use: No  . Sexual activity: Not on file   Other Topics Concern  . Not on file   Social History Narrative  . No narrative on file    FAMILY HISTORY: Family History  Problem Relation Age of Onset  . CVA Mother   . Cancer Mother 10    breast cancer   . Leukemia Father   . Cancer Maternal Aunt 71    breast cancer    Mother had breast cancer at age of 40, and maternal aunt in 69  Father had leukemia   ALLERGIES:  is allergic to neulasta [pegfilgrastim].  MEDICATIONS:  Current Outpatient Prescriptions  Medication Sig Dispense Refill  . anastrozole (ARIMIDEX) 1 MG tablet Take 1 mg by mouth daily.  4  . Ascorbic Acid (VITAMIN C ER PO) Take 1 tablet by mouth daily.     Marland Kitchen aspirin 81 MG tablet  Take 81 mg by mouth daily.    . AZOPT 1 % ophthalmic suspension PLACE 1 DROP INTO BOTH EYES TWICE DAILY  12  . b complex vitamins tablet Take 1 tablet by mouth daily.    Marland Kitchen escitalopram (LEXAPRO) 20 MG tablet Take 30 mg by mouth daily.  3  . levothyroxine (SYNTHROID, LEVOTHROID) 75 MCG tablet TAKE 1 TABLET BY MOUTH EVERY DAY 90 tablet 2  . Multiple Vitamins-Minerals (MULTIVITAMIN ADULTS 50+ PO) Take 1 tablet by mouth daily.     . psyllium (METAMUCIL) 58.6 %  powder Take 1 packet by mouth daily.     Marland Kitchen tolterodine (DETROL LA) 4 MG 24 hr capsule Take 1 capsule (4 mg total) by mouth daily. 30 capsule 6  . triamcinolone cream (KENALOG) 0.1 % Use as needed for bug bites.  10   No current facility-administered medications for this visit.     REVIEW OF SYSTEMS:   Constitutional: Denies fevers, chills or abnormal night sweats Eyes: Denies blurriness of vision, double vision or watery eyes Ears, nose, mouth, throat, and face: Denies mucositis or sore throat Respiratory: Denies cough, dyspnea or wheezes Cardiovascular: Denies palpitation, chest discomfort or lower extremity swelling Gastrointestinal:  Denies nausea, heartburn or change in bowel habits Skin: Denies abnormal skin rashes Lymphatics: Denies new lymphadenopathy or easy bruising Neurological:Denies numbness, tingling or new weaknesses Behavioral/Psych: Mood is stable, no new changes  Musculoskeletal: (+) right outer calf pain, occasional R breast pain All other systems were reviewed with the patient and are negative.  PHYSICAL EXAMINATION: ECOG PERFORMANCE STATUS: 0 - Asymptomatic  Vitals:   07/13/16 1046  BP: (!) 161/64  Pulse: 70  Resp: 18  Temp: 98.7 F (37.1 C)   Filed Weights   07/13/16 1046  Weight: 232 lb 12.8 oz (105.6 kg)   GENERAL:alert, no distress and comfortable SKIN: skin color, texture, turgor are normal, no rashes or significant lesions EYES: normal, conjunctiva are pink and non-injected, sclera  clear OROPHARYNX:no exudate, no erythema and lips, buccal mucosa, and tongue normal  NECK: supple, thyroid normal size, non-tender, without nodularity LYMPH:  no palpable lymphadenopathy in the cervical, axillary or inguinal LUNGS: clear to auscultation and percussion with normal breathing effort HEART: regular rate & rhythm and no murmurs and no lower extremity edema ABDOMEN:abdomen soft, non-tender and normal bowel sounds Musculoskeletal:no cyanosis of digits and no clubbing  PSYCH: alert & oriented x 3 with fluent speech NEURO: no focal motor/sensory deficits. Breasts: Breast inspection showed bilateral mastectomy, (+) soft tissue fullness at both incision sites (access skin which was left for reconstruction), no masses or nodularity on chest wall, no palpable axillary adenopathy.   LABORATORY DATA:  I have reviewed the data as listed CBC Latest Ref Rng & Units 07/13/2016 05/03/2016 12/08/2015  WBC 3.9 - 10.3 10e3/uL 4.6 5.1 5.3  Hemoglobin 11.6 - 15.9 g/dL 12.2 12.8 12.4  Hematocrit 34.8 - 46.6 % 36.9 38.4 37.4  Platelets 145 - 400 10e3/uL 183 229.0 190.0   CMP Latest Ref Rng & Units 07/13/2016 05/03/2016 12/08/2015  Glucose 70 - 140 mg/dl 89 95 89  BUN 7.0 - 26.0 mg/dL 17.2 21 19   Creatinine 0.6 - 1.1 mg/dL 0.7 0.73 0.78  Sodium 136 - 145 mEq/L 141 141 138  Potassium 3.5 - 5.1 mEq/L 4.3 4.6 5.0  Chloride 96 - 112 mEq/L - 104 104  CO2 22 - 29 mEq/L 24 29 29   Calcium 8.4 - 10.4 mg/dL 9.6 10.0 9.8  Total Protein 6.4 - 8.3 g/dL 6.7 7.6 6.9  Total Bilirubin 0.20 - 1.20 mg/dL 0.57 0.5 0.5  Alkaline Phos 40 - 150 U/L 70 72 68  AST 5 - 34 U/L 22 17 19   ALT 0 - 55 U/L 28 20 23    PATHOLOGY REPORT  DIAGNOSIS 10/31/2006 1. Left axillary sentinel lymph node: 1 of 2 lymph nodes positive for Michael metastatic carcinoma, 0.5 mm 2. Left breast, mastectomy A tumor type: Infiltrative lobular B: Grade 2 C: Maximal invasive tumor size 3.9 cm D: LCIS, comprising less than 5% of tumor E: Margins of  resection negative for carcinoma F: Lymph nodes: 3 lymph nodes in axillary tail negative for carcinoma G: pT2pNmiMx H: Ancillary studies: Previously performed ER 3+, PR 3+, HER-2 equivocal. HER-2/neu Fish performed [facility] shows no evidence of HER-2/neu gene amplification with ratio of 1.05 3. right breast, mastectomy Atypical lobular hyperplasia with ductal involvement, central, upper outer and lower outer breast tissue fibrocystic changes. 2 lymph nodes in as a retail negative for carcinoma.  RADIOGRAPHIC STUDIES: I have personally reviewed the radiological images as listed and agreed with the findings in the report.  DG Bone Density 01/19/2016 The BMD measured at Femur Neck Right is 0.973 g/cm2 with a T-score of -0.5.  This patient is considered NORMAL according to [facility]) criteria. Per the official positions of the ISCD, it is not possible to quantitatively compare BMD or calculate an [facility] between exams done at different facilities.  Site Region Measured Date Measured Age YA BMD Significant CHANGE T-score DualFemur Neck Right 01/19/2016    [age]         -0.5    0.973 g/cm2  AP Spine  L1-L4      01/19/2016    65.1         1.7     1.406 g/cm2  World Health Organization [facility]) criteria for post-menopausal, Caucasian Women: Normal       T-score at or above -1 SD Osteopenia   T-score between -1 and -2.5 SD Osteoporosis T-score at or below -2.5 SD  ASSESSMENT & PLAN: 67 y.o. postmenopausal Caucasian female, history of left breast cancer.  1. 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 -She had a stage II hormone receptor positive, HER-2 negative breast cancer, and received adjuvant chemotherapy and currently on adjuvant anastrozole. -We previously discussed the risk of cancer  recurrence after surgery. Prognostic genomic testing such as Oncotype was not available when she was diagnosed. Based on her stage and ER/PR/HER-2 status, I think she probably has low to moderate risk of recurrence. -We previously discussed late recurrence is possible in hormonal receptor positive disease. -Given her stage II disease and lobular histology, I do recommend aromatase inhibitor for a total of 10 years. She will complete in May 2018. -We previously reviewed the potential side effects from aromatase inhibitor, she is tolerating well overall. -Her bone density scan was normal in 2017 -We'll continue breast cancer surveillance, including routine follow-up once a year with lab and exam. She had bilateral mastectomy, does not need screening mammogram. -She is clinically doing very well, her physical exam was unremarkable, labs including CBC and CMP was normal last month. No evidence of recurrence. -I encouraged her to have healthy diet, and continue exercise regularly. She is overweight, and has been trying to lose some weight.  -I reviewed the bone density scan with the patient. -She will be done with anastrozole in May 2018.   2. Hypothyroidism, depression, obesity -She'll continue follow-up with her primary care physician  3. Genetics -She had genetic testing 10 years ago, when she was diagnosed. She was negative for BRCA1 and 2 -We reviewed that genetic testing has made a lot advanced in the past decade. Due to her family history of breast cancer (mother and 2 aunts), I will refer her to genetic counselor since new genes have been added to the panel within the past 10 years.   Plan -  Continue anastrozole until May 2018 -Referred to genetic counselor.  -I'll see her back in 1 year with labs    Orders Placed This Encounter  Procedures  . CBC with Differential    Standing Status:   Standing    Number of Occurrences:   30    Standing Expiration Date:   07/12/2021  . Comprehensive  metabolic panel    Standing Status:   Standing    Number of Occurrences:   30    Standing Expiration Date:   07/12/2021  . Ambulatory referral to Genetics    Referral Priority:   Routine    Referral Type:   Consultation    Referral Reason:   Specialty Services Required    Number of Visits Requested:   1    All questions were answered. The patient knows to call the clinic with any problems, questions or concerns.  I spent 20 minutes counseling the patient face to face. The total time spent in the appointment was 25 minutes and more than 50% was on counseling.  This document serves as a record of services personally performed by Truitt Merle, MD. It was created on her behalf by Martinique Casey, a trained medical scribe. The creation of this record is based on the scribe's personal observations and the provider's statements to them. This document has been checked and approved by the attending provider.  I have reviewed the above documentation for accuracy and completeness and I agree with the above.   Truitt Merle, MD 07/13/2016 11:46 AM

## 2016-07-13 ENCOUNTER — Encounter: Payer: Self-pay | Admitting: Hematology

## 2016-07-13 ENCOUNTER — Telehealth: Payer: Self-pay | Admitting: Hematology

## 2016-07-13 ENCOUNTER — Other Ambulatory Visit (HOSPITAL_BASED_OUTPATIENT_CLINIC_OR_DEPARTMENT_OTHER): Payer: Medicare Other

## 2016-07-13 ENCOUNTER — Ambulatory Visit (HOSPITAL_BASED_OUTPATIENT_CLINIC_OR_DEPARTMENT_OTHER): Payer: Medicare Other | Admitting: Hematology

## 2016-07-13 VITALS — BP 161/64 | HR 70 | Temp 98.7°F | Resp 18 | Ht 62.0 in | Wt 232.8 lb

## 2016-07-13 DIAGNOSIS — C50412 Malignant neoplasm of upper-outer quadrant of left female breast: Secondary | ICD-10-CM | POA: Diagnosis not present

## 2016-07-13 DIAGNOSIS — Z170 Estrogen receptor positive status [ER+]: Principal | ICD-10-CM

## 2016-07-13 DIAGNOSIS — E669 Obesity, unspecified: Secondary | ICD-10-CM

## 2016-07-13 DIAGNOSIS — F329 Major depressive disorder, single episode, unspecified: Secondary | ICD-10-CM

## 2016-07-13 DIAGNOSIS — F419 Anxiety disorder, unspecified: Secondary | ICD-10-CM

## 2016-07-13 DIAGNOSIS — F32A Depression, unspecified: Secondary | ICD-10-CM

## 2016-07-13 DIAGNOSIS — Z79811 Long term (current) use of aromatase inhibitors: Secondary | ICD-10-CM | POA: Diagnosis not present

## 2016-07-13 DIAGNOSIS — E038 Other specified hypothyroidism: Secondary | ICD-10-CM

## 2016-07-13 LAB — CBC WITH DIFFERENTIAL/PLATELET
BASO%: 1 % (ref 0.0–2.0)
Basophils Absolute: 0 10*3/uL (ref 0.0–0.1)
EOS%: 1.1 % (ref 0.0–7.0)
Eosinophils Absolute: 0 10*3/uL (ref 0.0–0.5)
HEMATOCRIT: 36.9 % (ref 34.8–46.6)
HEMOGLOBIN: 12.2 g/dL (ref 11.6–15.9)
LYMPH#: 1.3 10*3/uL (ref 0.9–3.3)
LYMPH%: 27.7 % (ref 14.0–49.7)
MCH: 27.9 pg (ref 25.1–34.0)
MCHC: 33.1 g/dL (ref 31.5–36.0)
MCV: 84.3 fL (ref 79.5–101.0)
MONO#: 0.5 10*3/uL (ref 0.1–0.9)
MONO%: 10.5 % (ref 0.0–14.0)
NEUT#: 2.7 10*3/uL (ref 1.5–6.5)
NEUT%: 59.7 % (ref 38.4–76.8)
Platelets: 183 10*3/uL (ref 145–400)
RBC: 4.38 10*6/uL (ref 3.70–5.45)
RDW: 15.2 % — AB (ref 11.2–14.5)
WBC: 4.6 10*3/uL (ref 3.9–10.3)

## 2016-07-13 LAB — COMPREHENSIVE METABOLIC PANEL
ALBUMIN: 4 g/dL (ref 3.5–5.0)
ALT: 28 U/L (ref 0–55)
AST: 22 U/L (ref 5–34)
Alkaline Phosphatase: 70 U/L (ref 40–150)
Anion Gap: 7 mEq/L (ref 3–11)
BUN: 17.2 mg/dL (ref 7.0–26.0)
CALCIUM: 9.6 mg/dL (ref 8.4–10.4)
CHLORIDE: 109 meq/L (ref 98–109)
CO2: 24 mEq/L (ref 22–29)
CREATININE: 0.7 mg/dL (ref 0.6–1.1)
EGFR: 85 mL/min/{1.73_m2} — ABNORMAL LOW (ref >90)
Glucose: 89 mg/dl (ref 70–140)
Potassium: 4.3 mEq/L (ref 3.5–5.1)
Sodium: 141 mEq/L (ref 136–145)
Total Bilirubin: 0.57 mg/dL (ref 0.20–1.20)
Total Protein: 6.7 g/dL (ref 6.4–8.3)

## 2016-07-13 NOTE — Telephone Encounter (Signed)
Appointments scheduled per 2/8 LOS. Patient given AVS report and calendars with future scheduled appointments.  °

## 2016-07-20 ENCOUNTER — Ambulatory Visit (AMBULATORY_SURGERY_CENTER): Payer: Medicare Other | Admitting: Internal Medicine

## 2016-07-20 ENCOUNTER — Encounter: Payer: Self-pay | Admitting: Internal Medicine

## 2016-07-20 VITALS — BP 161/92 | HR 71 | Temp 97.5°F | Resp 15 | Ht 62.0 in | Wt 232.0 lb

## 2016-07-20 DIAGNOSIS — D125 Benign neoplasm of sigmoid colon: Secondary | ICD-10-CM | POA: Diagnosis not present

## 2016-07-20 DIAGNOSIS — D128 Benign neoplasm of rectum: Secondary | ICD-10-CM | POA: Diagnosis not present

## 2016-07-20 DIAGNOSIS — Z1211 Encounter for screening for malignant neoplasm of colon: Secondary | ICD-10-CM | POA: Diagnosis present

## 2016-07-20 DIAGNOSIS — Z1212 Encounter for screening for malignant neoplasm of rectum: Secondary | ICD-10-CM

## 2016-07-20 DIAGNOSIS — D122 Benign neoplasm of ascending colon: Secondary | ICD-10-CM

## 2016-07-20 DIAGNOSIS — Z800 Family history of malignant neoplasm of digestive organs: Secondary | ICD-10-CM | POA: Diagnosis not present

## 2016-07-20 DIAGNOSIS — D120 Benign neoplasm of cecum: Secondary | ICD-10-CM | POA: Diagnosis not present

## 2016-07-20 DIAGNOSIS — D127 Benign neoplasm of rectosigmoid junction: Secondary | ICD-10-CM | POA: Diagnosis not present

## 2016-07-20 MED ORDER — SODIUM CHLORIDE 0.9 % IV SOLN: 500.0000 mL | INTRAVENOUS | Status: DC

## 2016-07-20 NOTE — Progress Notes (Signed)
Called to room to assist during endoscopic procedure.  Patient ID and intended procedure confirmed with present staff. Received instructions for my participation in the procedure from the performing physician.  

## 2016-07-20 NOTE — Op Note (Signed)
[facility] Patient Name: Vanessa Christensen Procedure Date: 07/20/2016 8:34 AM MRN: [id number] Endoscopist: Jerene Bears , MD Age: 66 Referring MD:  Date of Birth: 1950/12/23 Gender: Female Account #: [id number] Procedure:                Colonoscopy Indications:              Colon cancer screening in patient at increased                            risk: Family history of 1st-degree relative with                            colon polyps, Last colonoscopy: 2011 Medicines:                Monitored Anesthesia Care Procedure:                Pre-Anesthesia Assessment:                           - Prior to the procedure, a History and Physical                            was performed, and patient medications and                            allergies were reviewed. The patient's tolerance of                            previous anesthesia was also reviewed. The risks                            and benefits of the procedure and the sedation                            options and risks were discussed with the patient.                            All questions were answered, and informed consent                            was obtained. Prior Anticoagulants: The patient has                            taken no previous anticoagulant or antiplatelet                            agents. ASA Grade Assessment: II - A patient with                            mild systemic disease. After reviewing the risks                            and benefits, the patient was deemed in  satisfactory condition to undergo the procedure.                           After obtaining informed consent, the colonoscope                            was passed under direct vision. Throughout the                            procedure, the patient's blood pressure, pulse, and                            oxygen saturations were monitored continuously. The                            Model PCF-[id number])  scope was introduced                            through the anus and advanced to the the cecum,                            identified by appendiceal orifice and ileocecal                            valve. The colonoscopy was performed without                            difficulty. The patient tolerated the procedure                            well. The quality of the bowel preparation was                            good. The ileocecal valve, appendiceal orifice, and                            rectum were photographed. Scope In: 8:46:05 AM Scope Out: 9:07:16 AM Scope Withdrawal Time: 0 hours 17 minutes 20 seconds  Total Procedure Duration: 0 hours 21 minutes 11 seconds  Findings:                 The digital rectal exam was normal.                           A 6 mm polyp was found in the ileocecal valve. The                            polyp was sessile. The polyp was removed with a                            cold snare. Resection and retrieval were complete.                           Three sessile polyps were found in the ascending  colon. The polyps were 4 to 5 mm in size. These                            polyps were removed with a cold snare. Resection                            and retrieval were complete.                           A 12 mm polyp was found in the sigmoid colon. The                            polyp was semi-pedunculated. The polyp was removed                            with a hot snare. Resection and retrieval were                            complete.                           A 6 mm polyp was found in the rectum. The polyp was                            sessile. The polyp was removed with a hot snare.                            Resection and retrieval were complete.                           Multiple small and large-mouthed diverticula were                            found from transverse colon to sigmoid colon.                           Internal  hemorrhoids were found during                            retroflexion. The hemorrhoids were small. Complications:            No immediate complications. Estimated Blood Loss:     Estimated blood loss was minimal. Impression:               - One 6 mm polyp at the ileocecal valve, removed                            with a cold snare. Resected and retrieved.                           - Three 4 to 5 mm polyps in the ascending colon,                            removed with a  cold snare. Resected and retrieved.                           - One 12 mm polyp in the sigmoid colon, removed                            with a hot snare. Resected and retrieved.                           - One 6 mm polyp in the rectum, removed with a hot                            snare. Resected and retrieved.                           - Moderate diverticulosis from transverse colon to                            sigmoid colon.                           - Internal hemorrhoids. Recommendation:           - Patient has a contact number available for                            emergencies. The signs and symptoms of potential                            delayed complications were discussed with the                            patient. Return to normal activities tomorrow.                            Written discharge instructions were provided to the                            patient.                           - Resume previous diet.                           - Continue present medications.                           - Await pathology results.                           - Repeat colonoscopy is recommended for                            surveillance. The colonoscopy date will be                            determined after  pathology results from today's                            exam become available for review.                           - No ibuprofen, naproxen, or other non-steroidal                            anti-inflammatory  drugs for 2 weeks after polyp                            removal. Jerene Bears, MD 07/20/2016 9:11:41 AM This report has been signed electronically.

## 2016-07-20 NOTE — Patient Instructions (Signed)
Discharge instructions given. Handouts on polyps,diverticulosis and hemorrhoids. No ibuprofen,naproxen,or other non-steroidal anti-inflammatory drugs for 2 weeks. Resume previous medications. YOU HAD AN ENDOSCOPIC PROCEDURE TODAY AT THE [facility]:   Refer to the procedure report that was given to you for any specific questions about what was found during the examination.  If the procedure report does not answer your questions, please call your gastroenterologist to clarify.  If you requested that your care partner not be given the details of your procedure findings, then the procedure report has been included in a sealed envelope for you to review at your convenience later.  YOU SHOULD EXPECT: Some feelings of bloating in the abdomen. Passage of more gas than usual.  Walking can help get rid of the air that was put into your GI tract during the procedure and reduce the bloating. If you had a lower endoscopy (such as a colonoscopy or flexible sigmoidoscopy) you may notice spotting of blood in your stool or on the toilet paper. If you underwent a bowel prep for your procedure, you may not have a normal bowel movement for a few days.  Please Note:  You might notice some irritation and congestion in your nose or some drainage.  This is from the oxygen used during your procedure.  There is no need for concern and it should clear up in a day or so.  SYMPTOMS TO REPORT IMMEDIATELY:   Following lower endoscopy (colonoscopy or flexible sigmoidoscopy):  Excessive amounts of blood in the stool  Significant tenderness or worsening of abdominal pains  Swelling of the abdomen that is new, acute  Fever of 100F or higher   For urgent or emergent issues, a gastroenterologist can be reached at any hour by calling [phone].   DIET:  We do recommend a small meal at first, but then you may proceed to your regular diet.  Drink plenty of fluids but you should avoid alcoholic beverages for 24  hours.  ACTIVITY:  You should plan to take it easy for the rest of today and you should NOT DRIVE or use heavy machinery until tomorrow (because of the sedation medicines used during the test).    FOLLOW UP: Our staff will call the number listed on your records the next business day following your procedure to check on you and address any questions or concerns that you may have regarding the information given to you following your procedure. If we do not reach you, we will leave a message.  However, if you are feeling well and you are not experiencing any problems, there is no need to return our call.  We will assume that you have returned to your regular daily activities without incident.  If any biopsies were taken you will be contacted by phone or by letter within the next 1-3 weeks.  Please call us at [phone] if you have not heard about the biopsies in 3 weeks.    SIGNATURES/CONFIDENTIALITY: You and/or your care partner have signed paperwork which will be entered into your electronic medical record.  These signatures attest to the fact that that the information above on your After Visit Summary has been reviewed and is understood.  Full responsibility of the confidentiality of this discharge information lies with you and/or your care-partner. 

## 2016-07-20 NOTE — Progress Notes (Signed)
A and O x3. Report to RN. Tolerated MAC anesthesia well.

## 2016-07-21 ENCOUNTER — Telehealth: Payer: Self-pay

## 2016-07-21 NOTE — Telephone Encounter (Signed)
  Follow up Call-  Call back number 07/20/2016  Post procedure Call Back phone  # [phone]  Permission to leave phone message Yes     Patient questions:  Do you have a fever, pain , or abdominal swelling? No. Pain Score  0 *  Have you tolerated food without any problems? Yes.    Have you been able to return to your normal activities? Yes.    Do you have any questions about your discharge instructions: Diet   No. Medications  No. Follow up visit  Yes.    Do you have questions or concerns about your Care? No.  Actions: * If pain score is 4 or above: No action needed, pain <4.

## 2016-07-25 ENCOUNTER — Ambulatory Visit (HOSPITAL_BASED_OUTPATIENT_CLINIC_OR_DEPARTMENT_OTHER): Payer: Medicare Other

## 2016-07-25 ENCOUNTER — Other Ambulatory Visit: Payer: Medicare Other

## 2016-07-25 DIAGNOSIS — Z803 Family history of malignant neoplasm of breast: Secondary | ICD-10-CM | POA: Diagnosis not present

## 2016-07-25 DIAGNOSIS — C50411 Malignant neoplasm of upper-outer quadrant of right female breast: Secondary | ICD-10-CM

## 2016-07-25 DIAGNOSIS — Z853 Personal history of malignant neoplasm of breast: Secondary | ICD-10-CM

## 2016-07-25 DIAGNOSIS — C50912 Malignant neoplasm of unspecified site of left female breast: Secondary | ICD-10-CM | POA: Diagnosis not present

## 2016-07-25 DIAGNOSIS — Z315 Encounter for genetic counseling: Secondary | ICD-10-CM | POA: Diagnosis not present

## 2016-07-25 DIAGNOSIS — Z806 Family history of leukemia: Secondary | ICD-10-CM

## 2016-07-25 NOTE — Progress Notes (Signed)
REFERRING PROVIDER: [name], MD [address] [name] [address] [place], [place] [address]  PRIMARY PROVIDER:  Annye Asa, MD  PRIMARY REASON FOR VISIT:  1. Personal history of breast cancer   2. Family history of malignant neoplasm of breast    HISTORY OF PRESENT ILLNESS:   Vanessa Christensen, a 66 y.o. female, was seen for a [facility] cancer genetics consultation at the request of Dr. [name] due to a personal and family history of cancer.  Vanessa Christensen presents to clinic today to discuss the possibility of a hereditary predisposition to cancer, genetic testing, and to further clarify her future cancer risks, as well as potential cancer risks for family members.   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 This was treated with elective bilateral mastectomy, chemotherapy and anti-estrogen therapy. At the time, she tested negative for mutations in BRCA1 and BRCA2.  CANCER HISTORY:  Oncology History   Breast cancer of upper-outer quadrant of left female breast [facility])   Staging form: Breast, AJCC 7th Edition   - Pathologic stage from 10/31/2006: Stage IIB (T2, N87m, cM0) - Signed by YTruitt Merle MD on 01/11/2016      [facility] [facility] [place] [facility] [place]   09/25/2006 Initial Biopsy    Left breast 2:00 position mass core needle biopsy showed invasive carcinoma with overlapping features of lobular and ductal carcinoma, grade 2.      09/25/2006 Receptors her2    ER 3+ positive, PR 3+ positive, HER-2 equivocal on IHC, [facility] of [facility]      09/25/2006 Initial Diagnosis    [facility] [place]      10/01/2006 Miscellaneous    BRCA1 and BRCA2 gene sequencing was negative for mutation.      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      01/10/2007 - 08/08/2007 Chemotherapy    Adriamycin 1257mand Cytoxan 120056mvery 4 weeks for 4 cycles, followed by docetaxel 150m85mr 2 cycles.         10/2007 -  Anti-estrogen oral therapy    Adjuvant anastrozole 1mg 34mly       09/09/2008 Imaging    PET scan showed no evidence of recurrence or metastatic breast cancer.      01/19/2016 Imaging    Bone Density Scan The BMD measured at Femur Neck Right is 0.973 g/cm2 with a T-score of -0.5.        HORMONAL RISK FACTORS:   Ovaries intact: yes.  Hysterectomy: no.  Menopausal status: postmenopausal.  HRT use: not assessed  Colonoscopy: yes; abnormal. Mammogram within the last year: n/a. Number of breast biopsies: not assessed. Up to date with pelvic exams:  no. Any excessive radiation exposure in the past:  no  Past Medical History:  Diagnosis Date  . Anxiety    pt denies  . Arthritis    hands and feet  . Cancer (HCC) [place]/2008   breast  . Cataracts, both eyes   . Depression   . Diverticulosis of colon   . Glaucoma   . Heart murmur   .  OSA on CPAP   . Sleep apnea   . Thyroid activity decreased     Past Surgical History:  Procedure Laterality Date  . BREAST SURGERY Bilateral   . CATARACT EXTRACTION    . GLAUCOMA REPAIR    . MASTECTOMY, RADICAL Bilateral   . neulasta induced sterile abscesses    . THYROIDECTOMY, PARTIAL    . TOTAL KNEE ARTHROPLASTY Right     Social History   Social History  . Marital status: Married    Spouse name: N/A  . Number of children: N/A  . Years of education: N/A   Social History Main Topics  . Smoking status: Former Smoker    Quit date: 11/03/1978  . Smokeless tobacco: Never Used  . Alcohol use No  . Drug use: No  . Sexual activity: Not on file   Other Topics Concern  . Not on file    Social History Narrative  . No narrative on file     FAMILY HISTORY:  We obtained a detailed, 4-generation family history.  Significant diagnoses are listed below: Family History  Problem Relation Age of Onset  . CVA Mother   . Cancer Mother 53    breast cancer   . Leukemia Father   . Cancer Maternal Aunt 55    breast cancer   . Cancer Maternal Aunt 55    breast cancer  . Cancer Cousin 32    breast cancer    Vanessa Christensen has previously tested negative for mutations in BRCA1 and BRCA2 but is unaware of previous family history of genetic testing for hereditary cancer risks. Patient's maternal ancestors are of [place] descent, and paternal ancestors are of [place] descent. There is no reported Ashkenazi Jewish ancestry but shared that her mother always said there may have been a Jewish ancestor in the family. There is no known consanguinity.  GENETIC COUNSELING ASSESSMENT: Vanessa Christensen is a 66 y.o. female with a personal and family history which is somewhat suggestive of a hereditary predisposition to cancer. We, therefore, discussed and recommended the following at today's visit.   DISCUSSION: We reviewed the characteristics, features and inheritance patterns of hereditary cancer syndromes. We also discussed genetic testing, including the appropriate family members to test, the process of testing, insurance coverage and turn-around-time for results. We discussed the implications of a negative, positive and/or variant of uncertain significant result. We recommended Vanessa Christensen pursue genetic testing for the Multi-Cancer gene panel.   Based on Vanessa Christensen's personal and family history of cancer, she meets medical criteria for genetic testing. Despite that she meets criteria, she may still have an out of pocket cost. We discussed that if her out of pocket cost for testing is over $100, the laboratory will call and confirm whether she wants to proceed with testing.  If the out of pocket cost of  testing is less than $100 she will be billed by the genetic testing laboratory.     PLAN: After considering the risks, benefits, and limitations, Vanessa Christensen  provided informed consent to pursue genetic testing and the blood sample was sent to [facility] for analysis of the Multi-Cancer gene panel test. Results should be available within approximately 2-3 weeks' time, at which point they will be disclosed by telephone to Vanessa Christensen, as will any additional recommendations warranted by these results. Vanessa Christensen will receive a summary of her genetic counseling visit and a copy of her results once available. This information will also be available in Epic. We encouraged  Vanessa Christensen to remain in contact with cancer genetics annually so that we can continuously update the family history and inform her of any changes in cancer genetics and testing that may be of benefit for her family. Vanessa Christensen questions were answered to her satisfaction today. Our contact information was provided should additional questions or concerns arise.  Lastly, we encouraged Vanessa Christensen to remain in contact with cancer genetics annually so that we can continuously update the family history and inform her of any changes in cancer genetics and testing that may be of benefit for this family.   Ms.  Christensen questions were answered to her satisfaction today. Our contact information was provided should additional questions or concerns arise. Thank you for the referral and allowing [name] to share in the care of your patient.   Benay Pike, MS, [facility] Certified Genetic Counselor  The patient was seen for a total of 35 minutes in face-to-face genetic counseling.  This patient was discussed with Drs. Magrinat, Lindi Adie and/or Burr Medico who agrees with the above.    _______________________________________________________________________ For Office Staff:  Number of people involved in session: 2 Was an Intern/ student involved with case:  no

## 2016-07-26 ENCOUNTER — Encounter: Payer: Self-pay | Admitting: Internal Medicine

## 2016-08-05 ENCOUNTER — Telehealth: Payer: Self-pay | Admitting: Genetic Counselor

## 2016-08-07 ENCOUNTER — Telehealth: Payer: Self-pay | Admitting: Genetic Counselor

## 2016-08-07 NOTE — Telephone Encounter (Signed)
Attempting to contact patient to discuss genetic test results.

## 2016-08-07 NOTE — Telephone Encounter (Signed)
Attempting to reach Ms. Urdahl to discuss her genetic testing results. Will try again tomorrow.

## 2016-08-08 ENCOUNTER — Ambulatory Visit: Payer: Self-pay | Admitting: Genetic Counselor

## 2016-08-08 ENCOUNTER — Telehealth: Payer: Self-pay | Admitting: Genetic Counselor

## 2016-08-08 DIAGNOSIS — Z803 Family history of malignant neoplasm of breast: Secondary | ICD-10-CM

## 2016-08-08 DIAGNOSIS — Z853 Personal history of malignant neoplasm of breast: Secondary | ICD-10-CM

## 2016-08-08 DIAGNOSIS — Z1379 Encounter for other screening for genetic and chromosomal anomalies: Secondary | ICD-10-CM

## 2016-08-08 NOTE — Progress Notes (Signed)
[facility]    Patient Name: Vanessa Christensen Patient DOB: 07-30-1950 Patient Age: 66 y.o. Encounter Date: 08/08/2016  Referring Provider: Truitt Merle, MD  Primary Care Provider: Annye Asa, MD  Vanessa Christensen was called today to discuss genetic test results. Please see the Genetics note from Vanessa Christensen visit on 07/25/16 for a detailed discussion of Vanessa Christensen personal and family history.  Genetic Testing: At the time of Vanessa Christensen visit, we recommended she pursue genetic testing of multiple genes associated with a hereditary predisposition to cancer. Testing included sequencing and deletion/duplication analysis of 80 genes on the Multi-Gene Panel offered by [facility]: ALK, APC, ATM, AXIN2,BAP1,  BARD1, BLM, BMPR1A, BRCA1, BRCA2, BRIP1, CASR, CDC73, CDH1, CDK4, CDKN1B, CDKN1C, CDKN2A (p14ARF), CDKN2A (p16INK4a), CEBPA, CHEK2, DICER1, CIS3L2, EGFR (c.2369C>T, p.Thr790Met variant only), EPCAM (Deletion/duplication testing only), FH, FLCN, GATA2, GPC3, GREM1 (Promoter region deletion/duplication testing only), HOXB13 (c.251G>A, p.Gly84Glu), HRAS, KIT, MAX, MEN1, MET, MITF (c.952G>A, p.Glu318Lys variant only), MLH1, MSH2, MSH6, MUTYH, NBN, NF1, NF2, PALB2, PDGFRA, PHOX2B, PMS2, POLD1, POLE, POT1, PRKAR1A, PTCH1, PTEN, RAD50, RAD51C, RAD51D, RB1, RECQL4, RET, RUNX1, SDHAF2, SDHA (sequence changes only), SDHB, SDHC, SDHD, SMAD4, SMARCA4, SMARCB1, SMARCE1, STK11, SUFU, TERT, TERT, TMEM127, TP53, TSC1, TSC2, VHL, WRN and WT1. Testing was normal and did not reveal a mutation in these genes. A copy of the genetic test report will be scanned into Epic under the media tab.  Since the current test is not perfect, it is possible there may be a gene mutation that current testing cannot detect, but that chance is small. We also discussed that it is possible that a different genetic factor, which was not part of this testing or has not yet been discovered, is responsible for the cancer  diagnoses in the family. Again, the likelihood of this is low. No additional testing is recommended at this time.   Analysis detected a Variant of Unknown Significance in the MSH6 gene called c.2693C>G. At this time, it is unknown if this finding is associated with increased cancer risk, but most variants get reclassified to being inconsequential. This finding should not be used to make medical management decisions. With time, we suspect the lab will determine the significance of it, if any. If we do learn more about it, we will try to contact Vanessa Christensen to discuss it further. However, it is important to stay in touch with [name] periodically and keep the address and phone number up to date.  Cancer Screening: Given the personal and family histories, we must interpret these negative results with some caution. Families with features suggestive of hereditary risk tend to have multiple family members with cancer, diagnoses in multiple generations and diagnoses before the age of 75. Ms. Absher family exhibits some of these features. This result may simply reflect our current inability to detect all mutations within these genes. It is also possible there is a different gene, that we have not yet tested, that may be responsible for the cancer in this family.   Family Members: While these results are reassuring for Vanessa Christensen, this test does not tell us anything about Vanessa Christensen's brother's genetic status. We recommended these relatives also have genetic counseling and testing. Please let us know if we can help facilitate testing. Genetic counselors can be located in other cities, by visiting the website of the [employer] (ArtistMovie.se) and Field seismologist for a Dietitian by zip code.  Lastly, cancer genetics is  a rapidly advancing field and it is possible that new genetic tests will be appropriate for Vanessa Christensen in the future. We encourage Vanessa Christensen to remain in contact with [name] on an annual basis so  we can update Vanessa Christensen personal and family histories, and let Vanessa Christensen know of advances in cancer genetics that may benefit the family. Our contact number was provided. Vanessa Christensen is welcome to call anytime with additional questions.    Marylouise Stacks, MS, [facility] Certified Genetic Counselor phone: [phone] [email or web address]

## 2016-08-08 NOTE — Telephone Encounter (Signed)
Discussed genetic testing results with Ms. Eulas Post. Please see my encounter note for more details.

## 2016-08-13 ENCOUNTER — Other Ambulatory Visit: Payer: Self-pay | Admitting: Family Medicine

## 2016-09-15 ENCOUNTER — Encounter (HOSPITAL_COMMUNITY): Payer: Self-pay

## 2016-09-21 ENCOUNTER — Other Ambulatory Visit: Payer: Self-pay | Admitting: Family Medicine

## 2016-09-28 DIAGNOSIS — D229 Melanocytic nevi, unspecified: Secondary | ICD-10-CM | POA: Diagnosis not present

## 2016-09-28 DIAGNOSIS — L309 Dermatitis, unspecified: Secondary | ICD-10-CM | POA: Diagnosis not present

## 2016-10-25 ENCOUNTER — Encounter: Payer: Self-pay | Admitting: Family Medicine

## 2016-10-25 ENCOUNTER — Ambulatory Visit (INDEPENDENT_AMBULATORY_CARE_PROVIDER_SITE_OTHER): Payer: Medicare Other | Admitting: Family Medicine

## 2016-10-25 VITALS — BP 128/81 | HR 83 | Temp 98.0°F | Resp 16 | Ht 62.0 in | Wt 232.1 lb

## 2016-10-25 DIAGNOSIS — Z1159 Encounter for screening for other viral diseases: Secondary | ICD-10-CM | POA: Diagnosis not present

## 2016-10-25 DIAGNOSIS — M25562 Pain in left knee: Secondary | ICD-10-CM

## 2016-10-25 DIAGNOSIS — E038 Other specified hypothyroidism: Secondary | ICD-10-CM

## 2016-10-25 LAB — LIPID PANEL
CHOL/HDL RATIO: 4
Cholesterol: 185 mg/dL (ref 0–200)
HDL: 43 mg/dL (ref >39.00)
LDL Cholesterol: 117 mg/dL — ABNORMAL HIGH (ref 0–99)
NONHDL: 141.94
Triglycerides: 127 mg/dL (ref 0.0–149.0)
VLDL: 25.4 mg/dL (ref 0.0–40.0)

## 2016-10-25 LAB — CBC WITH DIFFERENTIAL/PLATELET
BASOS ABS: 0 10*3/uL (ref 0.0–0.1)
BASOS PCT: 0.8 % (ref 0.0–3.0)
Eosinophils Absolute: 0 10*3/uL (ref 0.0–0.7)
Eosinophils Relative: 0.6 % (ref 0.0–5.0)
HEMATOCRIT: 38 % (ref 36.0–46.0)
Hemoglobin: 12.5 g/dL (ref 12.0–15.0)
LYMPHS ABS: 1.3 10*3/uL (ref 0.7–4.0)
Lymphocytes Relative: 24.6 % (ref 12.0–46.0)
MCHC: 32.9 g/dL (ref 30.0–36.0)
MCV: 85.3 fl (ref 78.0–100.0)
MONOS PCT: 9.9 % (ref 3.0–12.0)
Monocytes Absolute: 0.5 10*3/uL (ref 0.1–1.0)
NEUTROS ABS: 3.3 10*3/uL (ref 1.4–7.7)
Neutrophils Relative %: 64.1 % (ref 43.0–77.0)
Platelets: 198 10*3/uL (ref 150.0–400.0)
RBC: 4.46 Mil/uL (ref 3.87–5.11)
RDW: 15.4 % (ref 11.5–15.5)
WBC: 5.1 10*3/uL (ref 4.0–10.5)

## 2016-10-25 LAB — HEPATIC FUNCTION PANEL
ALBUMIN: 4.6 g/dL (ref 3.5–5.2)
ALK PHOS: 66 U/L (ref 39–117)
ALT: 27 U/L (ref 0–35)
AST: 22 U/L (ref 0–37)
BILIRUBIN DIRECT: 0.1 mg/dL (ref 0.0–0.3)
Total Bilirubin: 0.5 mg/dL (ref 0.2–1.2)
Total Protein: 6.7 g/dL (ref 6.0–8.3)

## 2016-10-25 LAB — BASIC METABOLIC PANEL
BUN: 18 mg/dL (ref 6–23)
CHLORIDE: 107 meq/L (ref 96–112)
CO2: 25 meq/L (ref 19–32)
CREATININE: 0.7 mg/dL (ref 0.40–1.20)
Calcium: 9.7 mg/dL (ref 8.4–10.5)
GFR: 88.47 mL/min (ref >60.00)
Glucose, Bld: 89 mg/dL (ref 70–99)
POTASSIUM: 5 meq/L (ref 3.5–5.1)
Sodium: 141 mEq/L (ref 135–145)

## 2016-10-25 LAB — TSH: TSH: 0.91 u[IU]/mL (ref 0.35–4.50)

## 2016-10-25 NOTE — Progress Notes (Signed)
Pre visit review using our clinic review tool, if applicable. No additional management support is needed unless otherwise documented below in the visit note. 

## 2016-10-25 NOTE — Patient Instructions (Signed)
Schedule your annual wellness visit in 6 months and a follow up w/ me at the same time [facility] notify you of your lab results and make any changes if needed Continue to work on healthy diet and regular exercise- you can do it! Call with any questions or concerns Have a great summer!!!

## 2016-10-25 NOTE — Assessment & Plan Note (Signed)
New to provider, recurrent for pt.  She is asking for PT referral- placed for pt.  Will follow.

## 2016-10-25 NOTE — Assessment & Plan Note (Signed)
Ongoing issue.  She has not lost any weight but is exercising regularly.  Admits she has been eating poorly.  Check labs to risk stratify.  Will follow.

## 2016-10-25 NOTE — Assessment & Plan Note (Signed)
Chronic problem.  On Levothyroxine daily.  Asymptomatic at this time.  Check labs.  Adjust meds prn

## 2016-10-25 NOTE — Progress Notes (Signed)
   Subjective:    Patient ID: Vanessa Christensen, female    DOB: Dec 24, 1950, 66 y.o.   MRN: [id number]  HPI Hypothyroid- ongoing issue.  On Levothyroxine 65mcg daily.  Denies excessive fatigue, changes to skin, hair, nails.  Denies diarrhea/constipation.  Morbid obesity- chronic problem.  Pt's BMI remains 42.5  She has not lost any weight since last visit.  Exercising 4-5x/week at the gym for 1 hr.  Not following a particular diet.  Admits to eating out quite a bit  L knee pain- pt reports she has recurrent L posterior knee pain.  She is interested in starting PT as she has a trip to [place] upcoming   Review of Systems For ROS see HPI     Objective:   Physical Exam  Constitutional: She is oriented to person, place, and time. She appears well-developed and well-nourished. No distress.  HENT:  Head: Normocephalic and atraumatic.  Eyes: Conjunctivae and EOM are normal. Pupils are equal, round, and reactive to light.  Neck: Normal range of motion. Neck supple. No thyromegaly present.  Cardiovascular: Normal rate, regular rhythm, normal heart sounds and intact distal pulses.   No murmur heard. Pulmonary/Chest: Effort normal and breath sounds normal. No respiratory distress.  Abdominal: Soft. She exhibits no distension. There is no tenderness.  Musculoskeletal: She exhibits no edema.  Lymphadenopathy:    She has no cervical adenopathy.  Neurological: She is alert and oriented to person, place, and time.  Skin: Skin is warm and dry.  Psychiatric: She has a normal mood and affect. Her behavior is normal.  Vitals reviewed.         Assessment & Plan:

## 2016-10-26 LAB — HEPATITIS C ANTIBODY: HCV AB: NEGATIVE

## 2016-11-01 DIAGNOSIS — M25562 Pain in left knee: Secondary | ICD-10-CM | POA: Diagnosis not present

## 2016-11-01 DIAGNOSIS — M25662 Stiffness of left knee, not elsewhere classified: Secondary | ICD-10-CM | POA: Diagnosis not present

## 2016-11-01 DIAGNOSIS — R262 Difficulty in walking, not elsewhere classified: Secondary | ICD-10-CM | POA: Diagnosis not present

## 2016-11-03 DIAGNOSIS — M25662 Stiffness of left knee, not elsewhere classified: Secondary | ICD-10-CM | POA: Diagnosis not present

## 2016-11-03 DIAGNOSIS — M25562 Pain in left knee: Secondary | ICD-10-CM | POA: Diagnosis not present

## 2016-11-03 DIAGNOSIS — R262 Difficulty in walking, not elsewhere classified: Secondary | ICD-10-CM | POA: Diagnosis not present

## 2016-11-07 DIAGNOSIS — M25662 Stiffness of left knee, not elsewhere classified: Secondary | ICD-10-CM | POA: Diagnosis not present

## 2016-11-07 DIAGNOSIS — R262 Difficulty in walking, not elsewhere classified: Secondary | ICD-10-CM | POA: Diagnosis not present

## 2016-11-07 DIAGNOSIS — M25562 Pain in left knee: Secondary | ICD-10-CM | POA: Diagnosis not present

## 2016-11-10 DIAGNOSIS — R262 Difficulty in walking, not elsewhere classified: Secondary | ICD-10-CM | POA: Diagnosis not present

## 2016-11-10 DIAGNOSIS — M25662 Stiffness of left knee, not elsewhere classified: Secondary | ICD-10-CM | POA: Diagnosis not present

## 2016-11-10 DIAGNOSIS — M25562 Pain in left knee: Secondary | ICD-10-CM | POA: Diagnosis not present

## 2016-11-14 DIAGNOSIS — M25562 Pain in left knee: Secondary | ICD-10-CM | POA: Diagnosis not present

## 2016-11-14 DIAGNOSIS — R262 Difficulty in walking, not elsewhere classified: Secondary | ICD-10-CM | POA: Diagnosis not present

## 2016-11-14 DIAGNOSIS — M25662 Stiffness of left knee, not elsewhere classified: Secondary | ICD-10-CM | POA: Diagnosis not present

## 2016-11-15 DIAGNOSIS — H401131 Primary open-angle glaucoma, bilateral, mild stage: Secondary | ICD-10-CM | POA: Diagnosis not present

## 2016-11-15 DIAGNOSIS — Z961 Presence of intraocular lens: Secondary | ICD-10-CM | POA: Diagnosis not present

## 2016-11-16 DIAGNOSIS — M25562 Pain in left knee: Secondary | ICD-10-CM | POA: Diagnosis not present

## 2016-11-16 DIAGNOSIS — R262 Difficulty in walking, not elsewhere classified: Secondary | ICD-10-CM | POA: Diagnosis not present

## 2016-11-16 DIAGNOSIS — M25662 Stiffness of left knee, not elsewhere classified: Secondary | ICD-10-CM | POA: Diagnosis not present

## 2016-12-12 DIAGNOSIS — M25562 Pain in left knee: Secondary | ICD-10-CM | POA: Diagnosis not present

## 2016-12-12 DIAGNOSIS — R262 Difficulty in walking, not elsewhere classified: Secondary | ICD-10-CM | POA: Diagnosis not present

## 2016-12-12 DIAGNOSIS — M25662 Stiffness of left knee, not elsewhere classified: Secondary | ICD-10-CM | POA: Diagnosis not present

## 2016-12-20 DIAGNOSIS — R262 Difficulty in walking, not elsewhere classified: Secondary | ICD-10-CM | POA: Diagnosis not present

## 2016-12-20 DIAGNOSIS — M25562 Pain in left knee: Secondary | ICD-10-CM | POA: Diagnosis not present

## 2016-12-20 DIAGNOSIS — M25662 Stiffness of left knee, not elsewhere classified: Secondary | ICD-10-CM | POA: Diagnosis not present

## 2016-12-27 DIAGNOSIS — R262 Difficulty in walking, not elsewhere classified: Secondary | ICD-10-CM | POA: Diagnosis not present

## 2016-12-27 DIAGNOSIS — M25562 Pain in left knee: Secondary | ICD-10-CM | POA: Diagnosis not present

## 2016-12-27 DIAGNOSIS — M25662 Stiffness of left knee, not elsewhere classified: Secondary | ICD-10-CM | POA: Diagnosis not present

## 2017-01-10 DIAGNOSIS — R262 Difficulty in walking, not elsewhere classified: Secondary | ICD-10-CM | POA: Diagnosis not present

## 2017-01-10 DIAGNOSIS — M25662 Stiffness of left knee, not elsewhere classified: Secondary | ICD-10-CM | POA: Diagnosis not present

## 2017-01-10 DIAGNOSIS — M25562 Pain in left knee: Secondary | ICD-10-CM | POA: Diagnosis not present

## 2017-02-09 ENCOUNTER — Other Ambulatory Visit: Payer: Self-pay | Admitting: Family Medicine

## 2017-02-09 NOTE — Telephone Encounter (Signed)
Medication filled to pharmacy as requested.   

## 2017-02-17 ENCOUNTER — Other Ambulatory Visit: Payer: Self-pay | Admitting: Family Medicine

## 2017-04-23 ENCOUNTER — Encounter: Payer: Self-pay | Admitting: Family Medicine

## 2017-04-23 ENCOUNTER — Ambulatory Visit (INDEPENDENT_AMBULATORY_CARE_PROVIDER_SITE_OTHER): Payer: Medicare Other | Admitting: Family Medicine

## 2017-04-23 VITALS — BP 126/76 | HR 85 | Temp 99.6°F | Resp 16 | Wt 236.1 lb

## 2017-04-23 DIAGNOSIS — H8110 Benign paroxysmal vertigo, unspecified ear: Secondary | ICD-10-CM | POA: Diagnosis not present

## 2017-04-23 DIAGNOSIS — L989 Disorder of the skin and subcutaneous tissue, unspecified: Secondary | ICD-10-CM | POA: Diagnosis not present

## 2017-04-23 DIAGNOSIS — N3000 Acute cystitis without hematuria: Secondary | ICD-10-CM | POA: Diagnosis not present

## 2017-04-23 LAB — POC URINALSYSI DIPSTICK (AUTOMATED)
Bilirubin, UA: NEGATIVE
GLUCOSE UA: NEGATIVE
NITRITE UA: NEGATIVE
Protein, UA: 15
UROBILINOGEN UA: 0.2 U/dL
pH, UA: 6 (ref 5.0–8.0)

## 2017-04-23 MED ORDER — CIPROFLOXACIN HCL 500 MG PO TABS: 500.0000 mg | ORAL_TABLET | Freq: Two times a day (BID) | ORAL | 0 refills | Status: DC

## 2017-04-23 NOTE — Progress Notes (Signed)
Subjective   CC:  Chief Complaint  Patient presents with  . Possible UTI    urinary frequency, lower abd pain and pressure  . Dizziness    x6 weeks  . Possible Ring worm    left arm    HPI: Vanessa Christensen is a 66 y.o. female who presents to the office today to address the problems listed above in the chief complaint.  UTI sxs started 2-3 days ago: decreased appetite, urinary pressure, pain wit walking due to pressure, and urinary frequency, no gross hematuria, no dysuria, no fevers or odor. No vaginal sxs. No flank pain or n/v. Has OAB on detrol with some improvement.   Dizziness: morning of [place] (several weeks ago) awoke with acute vertigo. This was a recurrent problem - she had been treated by a PT in past and has HO for epley maneuvers; she and her husband did these resulting in acute nausea. Did not help her symptoms. Vertigo is consistent with positional changes only. Symptoms are daily ever since. Not having any other associated sxs. No headaches, diplopia, dysarthria, paresis, or sxs at rest. Has used meclizine with some relief. No sinus sxs.   Has 2 small raised bumps on left popliteal fossa of UE. No itching, pain, rash, or drainage. X 2 days. Thinks it could be ring worm. She babysits her grandkids but no active exposures. No systemic sxs.   I reviewed the patients updated PMH, FH, and SocHx.    Patient Active Problem List   Diagnosis Date Noted  . Left knee pain 10/25/2016  . Genetic testing 08/08/2016  . Hypothyroidism 12/02/2015  . Anxiety and depression 12/02/2015  . OAB (overactive bladder) 12/02/2015  . Breast cancer of upper-outer quadrant of left female breast ([place]) 12/02/2015  . Severe obesity (BMI >= 40) ([place]) 12/02/2015   Current Meds  Medication Sig  . Ascorbic Acid (VITAMIN C ER PO) Take 1 tablet by mouth daily.   Marland Kitchen aspirin 81 MG tablet Take 81 mg by mouth daily.  . AZOPT 1 % ophthalmic suspension PLACE 1 DROP INTO BOTH EYES TWICE DAILY  .  b complex vitamins tablet Take 1 tablet by mouth daily.  Marland Kitchen escitalopram (LEXAPRO) 20 MG tablet TAKE 1 & 1/2 TABLETS BY MOUTH DAILY  . levothyroxine (SYNTHROID, LEVOTHROID) 75 MCG tablet TAKE 1 TABLET BY MOUTH EVERY DAY  . Multiple Vitamins-Minerals (MULTIVITAMIN ADULTS 50+ PO) Take 1 tablet by mouth daily.   . psyllium (METAMUCIL) 58.6 % powder Take 1 packet by mouth daily.   Marland Kitchen tolterodine (DETROL LA) 4 MG 24 hr capsule TAKE 1 CAPSULE (4 MG TOTAL) BY MOUTH DAILY.  Marland Kitchen triamcinolone cream (KENALOG) 0.1 % Use as needed for bug bites.    Review of Systems: Cardiovascular: negative for chest pain, no palpitations Respiratory: negative for SOB or persistent cough Gastrointestinal: negative for abdominal pain Genitourinary: negative for dysuria or gross hematuria Neurology: negative for ataxia  Objective  Vitals: BP 126/76 (BP Location: Right Arm, Patient Position: Sitting, Cuff Size: Large)   Pulse 85   Temp 99.6 F (37.6 C) (Oral)   Resp 16   Wt 236 lb 2 oz (107.1 kg)   SpO2 98%   BMI 43.19 kg/m  General: no acute distress , moves cautiously Psych:  Alert and oriented, normal mood and affect HEENT:  Normocephalic, atraumatic, supple neck  Cardiovascular:  RRR without murmur. no edema Respiratory:  Good breath sounds bilaterally, CTAB with normal respiratory effort Gastrointestinal: soft, flat abdomen, normal active bowel  sounds, no palpable masses, no hepatosplenomegaly, no appreciated hernias, no CVAT, + mild suprapubic ttp Skin:  Warm, no rashes - left inner elbow with 2 tiny raised flesh toned bumps, non tender, no erythema or flaking Neurologic:   Mental status is normal. normal gait, crn 2-11 nl. + vertigo with head mvt. No ataxia. No tremor.   Assessment  1. Benign paroxysmal positional vertigo, unspecified laterality   2. Acute cystitis without hematuria   3. Skin lesion of left arm      Plan    BPPV - persistent. Refer for vestibular rehab to PT. Meclizine as needed.  Continue fall avoidance. See AVS  UTI - check culture. 3d of cipro. Push fluids. F/u if not improved.   Reassured - sebaceous hyperplasia or enlarged hair follicles - no infection or ring worm. No tx needed.   Follow up: Return for PT appointment (referral placed with [facility]). or as needed for any worsening symptoms or changes in condition.   Commons side effects, risks, benefits, and alternatives for medications and treatment plan prescribed today were discussed, and the patient expressed understanding of the given instructions. Patient is instructed to call or message via MyChart if he/she has any questions or concerns regarding our treatment plan. No barriers to understanding were identified. We discussed Red Flag symptoms and signs in detail. Patient expressed understanding regarding what to do in case of urgent or emergency type symptoms.   Medication list was reconciled, printed and provided to the patient in AVS. Patient instructions and summary information was reviewed with the patient as documented in the AVS. This note was prepared with assistance of Dragon voice recognition software. Occasional wrong-word or sound-a-like substitutions may have occurred due to the inherent limitations of voice recognition software  Orders Placed This Encounter  Procedures  . Urine Culture  . Ambulatory referral to Physical Therapy  . POCT Urinalysis Dipstick (Automated)   Meds ordered this encounter  Medications  . ciprofloxacin (CIPRO) 500 MG tablet    Sig: Take 1 tablet (500 mg total) 2 (two) times daily by mouth.    Dispense:  6 tablet    Refill:  0

## 2017-04-23 NOTE — Patient Instructions (Signed)
Vertigo Vertigo means that you feel like you are moving when you are not. Vertigo can also make you feel like things around you are moving when they are not. This feeling can come and go at any time. Vertigo often goes away on its own. Follow these instructions at home:  Avoid making fast movements.  Avoid driving.  Avoid using heavy machinery.  Avoid doing any task or activity that might cause danger to you or other people if you would have a vertigo attack while you are doing it.  Sit down right away if you feel dizzy or have trouble with your balance.  Take over-the-counter and prescription medicines only as told by your doctor.  Follow instructions from your doctor about which positions or movements you should avoid.  Drink enough fluid to keep your pee (urine) clear or pale yellow.  Keep all follow-up visits as told by your doctor. This is important. Contact a doctor if:  Medicine does not help your vertigo.  You have a fever.  Your problems get worse or you have new symptoms.  Your family or friends see changes in your behavior.  You feel sick to your stomach (nauseous) or you throw up (vomit).  You have a "pins and needles" feeling or you are numb in part of your body. Get help right away if:  You have trouble moving or talking.  You are always dizzy.  You pass out (faint).  You get very bad headaches.  You feel weak or have trouble using your hands, arms, or legs.  You have changes in your hearing.  You have changes in your seeing (vision).  You get a stiff neck.  Bright light starts to bother you. This information is not intended to replace advice given to you by your health care provider. Make sure you discuss any questions you have with your health care provider. Document Released: 02/29/2008 Document Revised: 10/28/2015 Document Reviewed: 09/14/2014 Elsevier Interactive Patient Education  2018 [employer]. Urinary Tract Infection, Adult A urinary  tract infection (UTI) is an infection of any part of the urinary tract, which includes the kidneys, ureters, bladder, and urethra. These organs make, store, and get rid of urine in the body. UTI can be a bladder infection (cystitis) or kidney infection (pyelonephritis). What are the causes? This infection may be caused by fungi, viruses, or bacteria. Bacteria are the most common cause of UTIs. This condition can also be caused by repeated incomplete emptying of the bladder during urination. What increases the risk? This condition is more likely to develop if:  You ignore your need to urinate or hold urine for long periods of time.  You do not empty your bladder completely during urination.  You wipe back to front after urinating or having a bowel movement, if you are female.  You are uncircumcised, if you are female.  You are constipated.  You have a urinary catheter that stays in place (indwelling).  You have a weak defense (immune) system.  You have a medical condition that affects your bowels, kidneys, or bladder.  You have diabetes.  You take antibiotic medicines frequently or for long periods of time, and the antibiotics no longer work well against certain types of infections (antibiotic resistance).  You take medicines that irritate your urinary tract.  You are exposed to chemicals that irritate your urinary tract.  You are female.  What are the signs or symptoms? Symptoms of this condition include:  Fever.  Frequent urination or passing small  amounts of urine frequently.  Needing to urinate urgently.  Pain or burning with urination.  Urine that smells bad or unusual.  Cloudy urine.  Pain in the lower abdomen or back.  Trouble urinating.  Blood in the urine.  Vomiting or being less hungry than normal.  Diarrhea or abdominal pain.  Vaginal discharge, if you are female.  How is this diagnosed? This condition is diagnosed with a medical history and  physical exam. You will also need to provide a urine sample to test your urine. Other tests may be done, including:  Blood tests.  Sexually transmitted disease (STD) testing.  If you have had more than one UTI, a cystoscopy or imaging studies may be done to determine the cause of the infections. How is this treated? Treatment for this condition often includes a combination of two or more of the following:  Antibiotic medicine.  Other medicines to treat less common causes of UTI.  Over-the-counter medicines to treat pain.  Drinking enough water to stay hydrated.  Follow these instructions at home:  Take over-the-counter and prescription medicines only as told by your health care provider.  If you were prescribed an antibiotic, take it as told by your health care provider. Do not stop taking the antibiotic even if you start to feel better.  Avoid alcohol, caffeine, tea, and carbonated beverages. They can irritate your bladder.  Drink enough fluid to keep your urine clear or pale yellow.  Keep all follow-up visits as told by your health care provider. This is important.  Make sure to: ? Empty your bladder often and completely. Do not hold urine for long periods of time. ? Empty your bladder before and after sex. ? Wipe from front to back after a bowel movement if you are female. Use each tissue one time when you wipe. Contact a health care provider if:  You have back pain.  You have a fever.  You feel nauseous or vomit.  Your symptoms do not get better after 3 days.  Your symptoms go away and then return. Get help right away if:  You have severe back pain or lower abdominal pain.  You are vomiting and cannot keep down any medicines or water. This information is not intended to replace advice given to you by your health care provider. Make sure you discuss any questions you have with your health care provider. Document Released: 03/01/2005 Document Revised: 11/03/2015  Document Reviewed: 04/12/2015 Elsevier Interactive Patient Education  2017 [employer].

## 2017-04-24 LAB — URINE CULTURE
MICRO NUMBER: 81303120
SPECIMEN QUALITY:: ADEQUATE

## 2017-04-25 DIAGNOSIS — H8113 Benign paroxysmal vertigo, bilateral: Secondary | ICD-10-CM | POA: Diagnosis not present

## 2017-05-02 DIAGNOSIS — H8113 Benign paroxysmal vertigo, bilateral: Secondary | ICD-10-CM | POA: Diagnosis not present

## 2017-05-09 ENCOUNTER — Encounter: Payer: Self-pay | Admitting: General Practice

## 2017-05-09 ENCOUNTER — Encounter: Payer: Self-pay | Admitting: Family Medicine

## 2017-05-09 ENCOUNTER — Other Ambulatory Visit: Payer: Self-pay

## 2017-05-09 ENCOUNTER — Ambulatory Visit (INDEPENDENT_AMBULATORY_CARE_PROVIDER_SITE_OTHER): Payer: Medicare Other | Admitting: Family Medicine

## 2017-05-09 VITALS — BP 124/74 | HR 74 | Temp 98.1°F | Resp 16 | Ht 62.0 in | Wt 236.4 lb

## 2017-05-09 DIAGNOSIS — Z23 Encounter for immunization: Secondary | ICD-10-CM

## 2017-05-09 DIAGNOSIS — E038 Other specified hypothyroidism: Secondary | ICD-10-CM | POA: Diagnosis not present

## 2017-05-09 DIAGNOSIS — Z0000 Encounter for general adult medical examination without abnormal findings: Secondary | ICD-10-CM

## 2017-05-09 LAB — CBC WITH DIFFERENTIAL/PLATELET
BASOS ABS: 0 10*3/uL (ref 0.0–0.1)
Basophils Relative: 0.7 % (ref 0.0–3.0)
EOS PCT: 0.5 % (ref 0.0–5.0)
Eosinophils Absolute: 0 10*3/uL (ref 0.0–0.7)
HEMATOCRIT: 36.2 % (ref 36.0–46.0)
HEMOGLOBIN: 11.8 g/dL — AB (ref 12.0–15.0)
LYMPHS ABS: 1.3 10*3/uL (ref 0.7–4.0)
LYMPHS PCT: 25.7 % (ref 12.0–46.0)
MCHC: 32.7 g/dL (ref 30.0–36.0)
MCV: 85.8 fl (ref 78.0–100.0)
MONOS PCT: 8.3 % (ref 3.0–12.0)
Monocytes Absolute: 0.4 10*3/uL (ref 0.1–1.0)
Neutro Abs: 3.4 10*3/uL (ref 1.4–7.7)
Neutrophils Relative %: 64.8 % (ref 43.0–77.0)
Platelets: 207 10*3/uL (ref 150.0–400.0)
RBC: 4.22 Mil/uL (ref 3.87–5.11)
RDW: 14.7 % (ref 11.5–15.5)
WBC: 5.3 10*3/uL (ref 4.0–10.5)

## 2017-05-09 LAB — BASIC METABOLIC PANEL
BUN: 16 mg/dL (ref 6–23)
CALCIUM: 9.2 mg/dL (ref 8.4–10.5)
CO2: 28 mEq/L (ref 19–32)
Chloride: 106 mEq/L (ref 96–112)
Creatinine, Ser: 0.68 mg/dL (ref 0.40–1.20)
GFR: 91.33 mL/min (ref >60.00)
Glucose, Bld: 92 mg/dL (ref 70–99)
POTASSIUM: 4.8 meq/L (ref 3.5–5.1)
Sodium: 142 mEq/L (ref 135–145)

## 2017-05-09 LAB — HEPATIC FUNCTION PANEL
ALBUMIN: 4.5 g/dL (ref 3.5–5.2)
ALK PHOS: 66 U/L (ref 39–117)
ALT: 29 U/L (ref 0–35)
AST: 24 U/L (ref 0–37)
Bilirubin, Direct: 0.1 mg/dL (ref 0.0–0.3)
Total Bilirubin: 0.6 mg/dL (ref 0.2–1.2)
Total Protein: 6.7 g/dL (ref 6.0–8.3)

## 2017-05-09 LAB — LIPID PANEL
CHOL/HDL RATIO: 4
Cholesterol: 172 mg/dL (ref 0–200)
HDL: 47.5 mg/dL (ref >39.00)
LDL CALC: 105 mg/dL — AB (ref 0–99)
NonHDL: 124.12
TRIGLYCERIDES: 98 mg/dL (ref 0.0–149.0)
VLDL: 19.6 mg/dL (ref 0.0–40.0)

## 2017-05-09 LAB — TSH: TSH: 0.86 u[IU]/mL (ref 0.35–4.50)

## 2017-05-09 MED ORDER — ZOSTER VAC RECOMB ADJUVANTED 50 MCG/0.5ML IM SUSR
0.5000 mL | Freq: Once | INTRAMUSCULAR | 1 refills | Status: AC
Start: 2017-05-09 — End: 2017-05-09

## 2017-05-09 NOTE — Patient Instructions (Addendum)
Follow up in 6 months to recheck thyroid We'll notify you of your lab results and make any changes if needed Keep up the good work on healthy diet and regular exercise- you're doing great!!! Call with any questions or concerns Happy Holidays!!!  Shingles vaccine at pharmacy.   Continue doing brain stimulating activities (puzzles, reading, adult coloring books, staying active) to keep memory sharp.   Health Maintenance, Female Adopting a healthy lifestyle and getting preventive care can go a long way to promote health and wellness. Talk with your health care provider about what schedule of regular examinations is right for you. This is a good chance for you to check in with your provider about disease prevention and staying healthy. In between checkups, there are plenty of things you can do on your own. Experts have done a lot of research about which lifestyle changes and preventive measures are most likely to keep you healthy. Ask your health care provider for more information. Weight and diet Eat a healthy diet  Be sure to include plenty of vegetables, fruits, low-fat dairy products, and lean protein.  Do not eat a lot of foods high in solid fats, added sugars, or salt.  Get regular exercise. This is one of the most important things you can do for your health. ? Most adults should exercise for at least 150 minutes each week. The exercise should increase your heart rate and make you sweat (moderate-intensity exercise). ? Most adults should also do strengthening exercises at least twice a week. This is in addition to the moderate-intensity exercise.  Maintain a healthy weight  Body mass index (BMI) is a measurement that can be used to identify possible weight problems. It estimates body fat based on height and weight. Your health care provider can help determine your BMI and help you achieve or maintain a healthy weight.  For females 46 years of age and older: ? A BMI below 18.5 is  considered underweight. ? A BMI of 18.5 to 24.9 is normal. ? A BMI of 25 to 29.9 is considered overweight. ? A BMI of 30 and above is considered obese.  Watch levels of cholesterol and blood lipids  You should start having your blood tested for lipids and cholesterol at 68 years of age, then have this test every 5 years.  You may need to have your cholesterol levels checked more often if: ? Your lipid or cholesterol levels are high. ? You are older than 68 years of age. ? You are at high risk for heart disease.  Cancer screening Lung Cancer  Lung cancer screening is recommended for adults 67-80 years old who are at high risk for lung cancer because of a history of smoking.  A yearly low-dose CT scan of the lungs is recommended for people who: ? Currently smoke. ? Have quit within the past 15 years. ? Have at least a 30-pack-year history of smoking. A pack year is smoking an average of one pack of cigarettes a day for 1 year.  Yearly screening should continue until it has been 15 years since you quit.  Yearly screening should stop if you develop a health problem that would prevent you from having lung cancer treatment.  Breast Cancer  Practice breast self-awareness. This means understanding how your breasts normally appear and feel.  It also means doing regular breast self-exams. Let your health care provider know about any changes, no matter how small.  If you are in your 20s or 30s, you should  have a clinical breast exam (CBE) by a health care provider every 1-3 years as part of a regular health exam.  If you are 40 or older, have a CBE every year. Also consider having a breast X-ray (mammogram) every year.  If you have a family history of breast cancer, talk to your health care provider about genetic screening.  If you are at high risk for breast cancer, talk to your health care provider about having an MRI and a mammogram every year.  Breast cancer gene (BRCA) assessment  is recommended for women who have family members with BRCA-related cancers. BRCA-related cancers include: ? Breast. ? Ovarian. ? Tubal. ? Peritoneal cancers.  Results of the assessment will determine the need for genetic counseling and BRCA1 and BRCA2 testing.  Cervical Cancer Your health care provider may recommend that you be screened regularly for cancer of the pelvic organs (ovaries, uterus, and vagina). This screening involves a pelvic examination, including checking for microscopic changes to the surface of your cervix (Pap test). You may be encouraged to have this screening done every 3 years, beginning at age 21.  For women ages 30-65, health care providers may recommend pelvic exams and Pap testing every 3 years, or they may recommend the Pap and pelvic exam, combined with testing for human papilloma virus (HPV), every 5 years. Some types of HPV increase your risk of cervical cancer. Testing for HPV may also be done on women of any age with unclear Pap test results.  Other health care providers may not recommend any screening for nonpregnant women who are considered low risk for pelvic cancer and who do not have symptoms. Ask your health care provider if a screening pelvic exam is right for you.  If you have had past treatment for cervical cancer or a condition that could lead to cancer, you need Pap tests and screening for cancer for at least 20 years after your treatment. If Pap tests have been discontinued, your risk factors (such as having a new sexual partner) need to be reassessed to determine if screening should resume. Some women have medical problems that increase the chance of getting cervical cancer. In these cases, your health care provider may recommend more frequent screening and Pap tests.  Colorectal Cancer  This type of cancer can be detected and often prevented.  Routine colorectal cancer screening usually begins at 68 years of age and continues through 68 years of  age.  Your health care provider may recommend screening at an earlier age if you have risk factors for colon cancer.  Your health care provider may also recommend using home test kits to check for hidden blood in the stool.  A small camera at the end of a tube can be used to examine your colon directly (sigmoidoscopy or colonoscopy). This is done to check for the earliest forms of colorectal cancer.  Routine screening usually begins at age 50.  Direct examination of the colon should be repeated every 5-10 years through 68 years of age. However, you may need to be screened more often if early forms of precancerous polyps or small growths are found.  Skin Cancer  Check your skin from head to toe regularly.  Tell your health care provider about any new moles or changes in moles, especially if there is a change in a mole's shape or color.  Also tell your health care provider if you have a mole that is larger than the size of a pencil eraser.  Always   use sunscreen. Apply sunscreen liberally and repeatedly throughout the day.  Protect yourself by wearing long sleeves, pants, a wide-brimmed hat, and sunglasses whenever you are outside.  Heart disease, diabetes, and high blood pressure  High blood pressure causes heart disease and increases the risk of stroke. High blood pressure is more likely to develop in: ? People who have blood pressure in the high end of the normal range (130-139/85-89 mm Hg). ? People who are overweight or obese. ? People who are African American.  If you are 18-39 years of age, have your blood pressure checked every 3-5 years. If you are 40 years of age or older, have your blood pressure checked every year. You should have your blood pressure measured twice-once when you are at a hospital or clinic, and once when you are not at a hospital or clinic. Record the average of the two measurements. To check your blood pressure when you are not at a hospital or clinic, you  can use: ? An automated blood pressure machine at a pharmacy. ? A home blood pressure monitor.  If you are between 55 years and 79 years old, ask your health care provider if you should take aspirin to prevent strokes.  Have regular diabetes screenings. This involves taking a blood sample to check your fasting blood sugar level. ? If you are at a normal weight and have a low risk for diabetes, have this test once every three years after 68 years of age. ? If you are overweight and have a high risk for diabetes, consider being tested at a younger age or more often. Preventing infection Hepatitis B  If you have a higher risk for hepatitis B, you should be screened for this virus. You are considered at high risk for hepatitis B if: ? You were born in a country where hepatitis B is common. Ask your health care provider which countries are considered high risk. ? Your parents were born in a high-risk country, and you have not been immunized against hepatitis B (hepatitis B vaccine). ? You have HIV or AIDS. ? You use needles to inject street drugs. ? You live with someone who has hepatitis B. ? You have had sex with someone who has hepatitis B. ? You get hemodialysis treatment. ? You take certain medicines for conditions, including cancer, organ transplantation, and autoimmune conditions.  Hepatitis C  Blood testing is recommended for: ? Everyone born from 1945 through 1965. ? Anyone with known risk factors for hepatitis C.  Sexually transmitted infections (STIs)  You should be screened for sexually transmitted infections (STIs) including gonorrhea and chlamydia if: ? You are sexually active and are younger than 68 years of age. ? You are older than 68 years of age and your health care provider tells you that you are at risk for this type of infection. ? Your sexual activity has changed since you were last screened and you are at an increased risk for chlamydia or gonorrhea. Ask your  health care provider if you are at risk.  If you do not have HIV, but are at risk, it may be recommended that you take a prescription medicine daily to prevent HIV infection. This is called pre-exposure prophylaxis (PrEP). You are considered at risk if: ? You are sexually active and do not regularly use condoms or know the HIV status of your partner(s). ? You take drugs by injection. ? You are sexually active with a partner who has HIV.  Talk with your   health care provider about whether you are at high risk of being infected with HIV. If you choose to begin PrEP, you should first be tested for HIV. You should then be tested every 3 months for as long as you are taking PrEP. Pregnancy  If you are premenopausal and you may become pregnant, ask your health care provider about preconception counseling.  If you may become pregnant, take 400 to 800 micrograms (mcg) of folic acid every day.  If you want to prevent pregnancy, talk to your health care provider about birth control (contraception). Osteoporosis and menopause  Osteoporosis is a disease in which the bones lose minerals and strength with aging. This can result in serious bone fractures. Your risk for osteoporosis can be identified using a bone density scan.  If you are 65 years of age or older, or if you are at risk for osteoporosis and fractures, ask your health care provider if you should be screened.  Ask your health care provider whether you should take a calcium or vitamin D supplement to lower your risk for osteoporosis.  Menopause may have certain physical symptoms and risks.  Hormone replacement therapy may reduce some of these symptoms and risks. Talk to your health care provider about whether hormone replacement therapy is right for you. Follow these instructions at home:  Schedule regular health, dental, and eye exams.  Stay current with your immunizations.  Do not use any tobacco products including cigarettes, chewing  tobacco, or electronic cigarettes.  If you are pregnant, do not drink alcohol.  If you are breastfeeding, limit how much and how often you drink alcohol.  Limit alcohol intake to no more than 1 drink per day for nonpregnant women. One drink equals 12 ounces of beer, 5 ounces of wine, or 1 ounces of hard liquor.  Do not use street drugs.  Do not share needles.  Ask your health care provider for help if you need support or information about quitting drugs.  Tell your health care provider if you often feel depressed.  Tell your health care provider if you have ever been abused or do not feel safe at home. This information is not intended to replace advice given to you by your health care provider. Make sure you discuss any questions you have with your health care provider. Document Released: 12/05/2010 Document Revised: 10/28/2015 Document Reviewed: 02/23/2015 Elsevier Interactive Patient Education  2018 [employer]  

## 2017-05-09 NOTE — Addendum Note (Signed)
Addended by: Gerilyn Nestle on: 05/09/2017 10:53 AM   Modules accepted: Orders

## 2017-05-09 NOTE — Assessment & Plan Note (Signed)
Chronic problem.  Pt complains of daily fatigue but no other sxs.  Check labs.  Adjust meds prn

## 2017-05-09 NOTE — Progress Notes (Signed)
   Subjective:    Patient ID: Vanessa Christensen, female    DOB: 06-Dec-1950, 66 y.o.   MRN: [id number]  HPI Hypothyroid- chronic problem, on Levothyroxine 72mcg daily.  Pt reports 'feeling good' but 'i'm tired'.  Taking naps daily if possible.  No changes to skin/hair/nails.  No changes to bowel habits.  Obesity- chronic problem.  BMI is 43.23.  Pt reports she is going to the gym 2-3x/week for an hour.  Not following a particular diet.  No CP, SOB, HAs, visual changes.   Review of Systems For ROS see HPI     Objective:   Physical Exam  Constitutional: She is oriented to person, place, and time. She appears well-developed and well-nourished. No distress.  obesity  HENT:  Head: Normocephalic and atraumatic.  Eyes: Conjunctivae and EOM are normal. Pupils are equal, round, and reactive to light.  Neck: Normal range of motion. Neck supple. No thyromegaly present.  Cardiovascular: Normal rate, regular rhythm, normal heart sounds and intact distal pulses.  No murmur heard. Pulmonary/Chest: Effort normal and breath sounds normal. No respiratory distress.  Abdominal: Soft. She exhibits no distension. There is no tenderness.  Musculoskeletal: She exhibits no edema.  Lymphadenopathy:    She has no cervical adenopathy.  Neurological: She is alert and oriented to person, place, and time.  Skin: Skin is warm and dry.  Psychiatric: She has a normal mood and affect. Her behavior is normal.  Vitals reviewed.         Assessment & Plan:

## 2017-05-09 NOTE — Assessment & Plan Note (Signed)
Ongoing issue for pt.  She is now exercising 2-3x/week- I applauded her efforts.  Not following a specific diet- recommended low carb diet.  Check labs to risk stratify.  Will follow.

## 2017-05-09 NOTE — Progress Notes (Addendum)
Subjective:   Vanessa Christensen is a 66 y.o. female who presents for Medicare Annual (Subsequent) preventive examination.  Review of Systems:  No ROS.  Medicare Wellness Visit. Additional risk factors are reflected in the social history.  Cardiac Risk Factors include: advanced age (>55men, >12 women);obesity (BMI >30kg/m2);family history of premature cardiovascular disease   Sleep patterns: Sleeps 7-8 hours. Uses CPAP.  Home Safety/Smoke Alarms: Feels safe in home. Smoke alarms in place.  Living environment; residence and Firearm Safety: Lives with husband in 1.5 story home.  Seat Belt Safety/Bike Helmet: Wears seat belt.   Female:   Pap-01/24/2010       Mammo-H/O Breast Ca, bilateral mastectomy.        Dexa scan-01/19/2016, normal.        CCS-Colonoscopy 07/20/2016, polyp. Recall 3 years.      Objective:     Vitals: BP 124/74   Pulse 74   Temp 98.1 F (36.7 C) (Oral)   Resp 16   Ht 5\' 2"  (1.575 m)   Wt 236 lb 6 oz (107.2 kg)   SpO2 98%   BMI 43.23 kg/m   Body mass index is 43.23 kg/m.  Advanced Directives 05/09/2017 07/13/2016 07/06/2016 05/03/2016 01/11/2016  Does Patient Have a Medical Advance Directive? Yes Yes Yes Yes Yes  Type of Paramedic of [place];Living will [place];Living will Living will [place];Living will -  Copy of [place] in Chart? Yes Yes - No - copy requested No - copy requested    Tobacco Social History   Tobacco Use  Smoking Status Former Smoker  . Last attempt to quit: 11/03/1978  . Years since quitting: 38.5  Smokeless Tobacco Never Used     Counseling given: Not Answered     Past Medical History:  Diagnosis Date  . Anxiety    pt denies  . Arthritis    hands and feet  . Cancer ([place]) 10/04/2006   breast  . Cataracts, both eyes   . Depression   . Diverticulosis of colon   . Glaucoma   . Heart murmur   . OSA on CPAP   . Sleep apnea   . Thyroid  activity decreased    Past Surgical History:  Procedure Laterality Date  . BREAST SURGERY Bilateral   . CATARACT EXTRACTION    . GLAUCOMA REPAIR    . MASTECTOMY, RADICAL Bilateral   . neulasta induced sterile abscesses    . THYROIDECTOMY, PARTIAL    . TOTAL KNEE ARTHROPLASTY Right    Family History  Problem Relation Age of Onset  . CVA Mother   . Cancer Mother 87       breast cancer   . Leukemia Father   . Cancer Maternal Aunt 55       breast cancer   . Cancer Maternal Aunt 55       breast cancer  . Cancer Cousin 32       breast cancer   Social History   Socioeconomic History  . Marital status: Married    Spouse name: None  . Number of children: None  . Years of education: None  . Highest education level: None  Social Needs  . Financial resource strain: None  . Food insecurity - worry: None  . Food insecurity - inability: None  . Transportation needs - medical: None  . Transportation needs - non-medical: None  Occupational History  . None  Tobacco Use  . Smoking  status: Former Smoker    Last attempt to quit: 11/03/1978    Years since quitting: 38.5  . Smokeless tobacco: Never Used  Substance and Sexual Activity  . Alcohol use: No  . Drug use: No  . Sexual activity: None  Other Topics Concern  . None  Social History Narrative  . None    Outpatient Encounter Medications as of 05/09/2017  Medication Sig  . Ascorbic Acid (VITAMIN C ER PO) Take 1 tablet by mouth daily.   Marland Kitchen aspirin 81 MG tablet Take 81 mg by mouth daily.  . AZOPT 1 % ophthalmic suspension PLACE 1 DROP INTO BOTH EYES TWICE DAILY  . b complex vitamins tablet Take 1 tablet by mouth daily.  Marland Kitchen escitalopram (LEXAPRO) 20 MG tablet TAKE 1 & 1/2 TABLETS BY MOUTH DAILY  . levothyroxine (SYNTHROID, LEVOTHROID) 75 MCG tablet TAKE 1 TABLET BY MOUTH EVERY DAY  . Multiple Vitamins-Minerals (MULTIVITAMIN ADULTS 50+ PO) Take 1 tablet by mouth daily.   . psyllium (METAMUCIL) 58.6 % powder Take 1 packet by mouth  daily.   Marland Kitchen tolterodine (DETROL LA) 4 MG 24 hr capsule TAKE 1 CAPSULE (4 MG TOTAL) BY MOUTH DAILY.  Marland Kitchen triamcinolone cream (KENALOG) 0.1 % Use as needed for bug bites.  . Zoster Vaccine Adjuvanted [facility]) injection Inject 0.5 mLs into the muscle once for 1 dose.  . [DISCONTINUED] ciprofloxacin (CIPRO) 500 MG tablet Take 1 tablet (500 mg total) 2 (two) times daily by mouth.   No facility-administered encounter medications on file as of 05/09/2017.      Patient Care Team: [name], MD as PCP - General (Family Medicine) Katy Apo, MD as Consulting Physician (Ophthalmology) Truitt Merle, MD as Consulting Physician (Hematology) [place] (Dentistry)    Assessment:    Physical assessment deferred to PCP.  Exercise Activities and Dietary recommendations Current Exercise Habits: Structured exercise class, Type of exercise: strength training/weights, Time (Minutes): 60, Frequency (Times/Week): 3, Weekly Exercise (Minutes/Week): 180, Intensity: Mild, Exercise limited by: None identified   Diet (meal preparation, eat out, water intake, caffeinated beverages, dairy products, fruits and vegetables): Drinks water, coffee and un-sweet tea.   Attempts to eat heart healthy. Avoids fast food.   Discussed portion control.   Goals    . Weight (lb) < 185 lb (83.9 kg)     Will increase exercise throughout the week.     . Weight (lb) < 210 lb (95.3 kg)     Lose weight by monitoring portion control.       Fall Risk Fall Risk  05/09/2017 05/09/2017 05/03/2016 12/02/2015  Falls in the past year? No No No No    Depression Screen PHQ 2/9 Scores 05/09/2017 05/09/2017 10/25/2016 05/03/2016  PHQ - 2 Score 1 0 0 0  PHQ- 9 Score 10 0 0 -     Cognitive Function MMSE - Mini Mental State Exam 05/09/2017  Orientation to time 5  Orientation to Place 5  Registration 3  Attention/ Calculation 5  Recall 3  Language- name 2 objects 2  Language- repeat 1  Language- follow 3 step command 3    Language- read & follow direction 1  Write a sentence 1  Copy design 1  Total score 30        Immunization History  Administered Date(s) Administered  . Influenza,inj,Quad PF,6+ Mos 04/16/2017  . Influenza-Unspecified 03/05/2016  . Pneumococcal-Unspecified 03/05/2016  . Tdap 12/01/2013  . Zoster 06/05/2008   Screening Tests Health Maintenance  Topic Date Due  .  PNA vac Low Risk Adult (2 of 2 - PCV13) 05/09/2018 (Originally 03/05/2017)  . COLONOSCOPY  07/21/2019  . TETANUS/TDAP  12/02/2023  . INFLUENZA VACCINE  Completed  . DEXA SCAN  Completed  . Hepatitis C Screening  Completed       Plan:    Shingles vaccine at pharmacy.   Continue doing brain stimulating activities (puzzles, reading, adult coloring books, staying active) to keep memory sharp.   I have personally reviewed and noted the following in the patient's chart:   . Medical and social history . Use of alcohol, tobacco or illicit drugs  . Current medications and supplements . Functional ability and status . Nutritional status . Physical activity . Advanced directives . List of other physicians . Hospitalizations, surgeries, and ER visits in previous 12 months . Vitals . Screenings to include cognitive, depression, and falls . Referrals and appointments  In addition, I have reviewed and discussed with patient certain preventive protocols, quality metrics, and best practice recommendations. A written personalized care plan for preventive services as well as general preventive health recommendations were provided to patient.     Gerilyn Nestle, RN  05/09/2017  PCP Notes: -PHQ9=10, states Lexapro is effective.   Reviewed documentation and agree w/ above.  Annye Asa, MD

## 2017-05-17 DIAGNOSIS — H401131 Primary open-angle glaucoma, bilateral, mild stage: Secondary | ICD-10-CM | POA: Diagnosis not present

## 2017-07-04 ENCOUNTER — Telehealth: Payer: Self-pay

## 2017-07-04 NOTE — Telephone Encounter (Signed)
Left a detaied voice message concerning appointment was changed to 2/25 @ 11am.

## 2017-07-13 ENCOUNTER — Other Ambulatory Visit: Payer: Medicare Other

## 2017-07-13 ENCOUNTER — Ambulatory Visit: Payer: Medicare Other | Admitting: Hematology

## 2017-07-30 ENCOUNTER — Other Ambulatory Visit: Payer: Self-pay | Admitting: Adult Health

## 2017-07-30 ENCOUNTER — Encounter: Payer: Self-pay | Admitting: Adult Health

## 2017-07-30 ENCOUNTER — Inpatient Hospital Stay: Payer: Medicare Other | Attending: Hematology

## 2017-07-30 ENCOUNTER — Inpatient Hospital Stay (HOSPITAL_BASED_OUTPATIENT_CLINIC_OR_DEPARTMENT_OTHER): Payer: Medicare Other | Admitting: Adult Health

## 2017-07-30 VITALS — BP 156/79 | HR 83 | Temp 98.4°F | Resp 18 | Ht 62.0 in | Wt 240.4 lb

## 2017-07-30 DIAGNOSIS — Z6841 Body Mass Index (BMI) 40.0 and over, adult: Secondary | ICD-10-CM | POA: Insufficient documentation

## 2017-07-30 DIAGNOSIS — F419 Anxiety disorder, unspecified: Secondary | ICD-10-CM | POA: Insufficient documentation

## 2017-07-30 DIAGNOSIS — Z9221 Personal history of antineoplastic chemotherapy: Secondary | ICD-10-CM

## 2017-07-30 DIAGNOSIS — C50412 Malignant neoplasm of upper-outer quadrant of left female breast: Secondary | ICD-10-CM

## 2017-07-30 DIAGNOSIS — Z9013 Acquired absence of bilateral breasts and nipples: Secondary | ICD-10-CM

## 2017-07-30 DIAGNOSIS — Z853 Personal history of malignant neoplasm of breast: Secondary | ICD-10-CM | POA: Diagnosis not present

## 2017-07-30 DIAGNOSIS — G4733 Obstructive sleep apnea (adult) (pediatric): Secondary | ICD-10-CM | POA: Insufficient documentation

## 2017-07-30 DIAGNOSIS — N3281 Overactive bladder: Secondary | ICD-10-CM | POA: Diagnosis not present

## 2017-07-30 DIAGNOSIS — Z9223 Personal history of estrogen therapy: Secondary | ICD-10-CM | POA: Insufficient documentation

## 2017-07-30 DIAGNOSIS — N6310 Unspecified lump in the right breast, unspecified quadrant: Secondary | ICD-10-CM

## 2017-07-30 DIAGNOSIS — E039 Hypothyroidism, unspecified: Secondary | ICD-10-CM | POA: Insufficient documentation

## 2017-07-30 DIAGNOSIS — F329 Major depressive disorder, single episode, unspecified: Secondary | ICD-10-CM | POA: Diagnosis not present

## 2017-07-30 DIAGNOSIS — Z87891 Personal history of nicotine dependence: Secondary | ICD-10-CM | POA: Insufficient documentation

## 2017-07-30 DIAGNOSIS — Z170 Estrogen receptor positive status [ER+]: Secondary | ICD-10-CM

## 2017-07-30 LAB — COMPREHENSIVE METABOLIC PANEL
ALK PHOS: 81 U/L (ref 40–150)
ALT: 30 U/L (ref 0–55)
ANION GAP: 8 (ref 3–11)
AST: 25 U/L (ref 5–34)
Albumin: 4.2 g/dL (ref 3.5–5.0)
BUN: 17 mg/dL (ref 7–26)
CALCIUM: 9.9 mg/dL (ref 8.4–10.4)
CHLORIDE: 104 mmol/L (ref 98–109)
CO2: 25 mmol/L (ref 22–29)
CREATININE: 0.81 mg/dL (ref 0.60–1.10)
Glucose, Bld: 90 mg/dL (ref 70–140)
Potassium: 4.4 mmol/L (ref 3.5–5.1)
Sodium: 137 mmol/L (ref 136–145)
Total Bilirubin: 0.6 mg/dL (ref 0.2–1.2)
Total Protein: 7.2 g/dL (ref 6.4–8.3)

## 2017-07-30 LAB — CBC WITH DIFFERENTIAL/PLATELET
Basophils Absolute: 0 10*3/uL (ref 0.0–0.1)
Basophils Relative: 0 %
Eosinophils Absolute: 0 10*3/uL (ref 0.0–0.5)
Eosinophils Relative: 1 %
HEMATOCRIT: 37.1 % (ref 34.8–46.6)
HEMOGLOBIN: 12.1 g/dL (ref 11.6–15.9)
Lymphocytes Relative: 18 %
Lymphs Abs: 1.2 10*3/uL (ref 0.9–3.3)
MCH: 27.5 pg (ref 25.1–34.0)
MCHC: 32.7 g/dL (ref 31.5–36.0)
MCV: 84.2 fL (ref 79.5–101.0)
MONOS PCT: 9 %
Monocytes Absolute: 0.6 10*3/uL (ref 0.1–0.9)
NEUTROS ABS: 5.1 10*3/uL (ref 1.5–6.5)
NEUTROS PCT: 72 %
Platelets: 182 10*3/uL (ref 145–400)
RBC: 4.41 MIL/uL (ref 3.70–5.45)
RDW: 15.2 % — ABNORMAL HIGH (ref 11.2–14.5)
WBC: 7 10*3/uL (ref 3.9–10.3)

## 2017-07-30 NOTE — Assessment & Plan Note (Addendum)
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    1. Left sided breast cancer: She stopped taking Anastrozole in 10/2016.  She is doing well today.  She will continue with annual breast exams/surveillance and we will see her back in one year considering the nodule in her right axilla.  2. Right axillary nodule: I attempted to get the patient set up for right axillary ultrasound with the breast center today.  They cannot schedule her until they receive her initial mammogram images from when she was diagnosed back in 2008.  We asked that she go to [facility] after leaving here to sign a release so they can obtain the necessary images as not to delay her care.  If it takes too long to get this done we alternatively can send [facility].      3. Health maintenance/Wellness: She will continue to f/u with her PCP.  She is up to date with her cancer screenings, which I encouraged her to continue with.  She was recommended to exercise 86min/day, 150 min per week and eat healthy diet with plentiful fruits and vegetables.    Vanessa Christensen will return in one year for follow up with Dr. Burr Medico.

## 2017-07-30 NOTE — Progress Notes (Signed)
[place] [facility] Follow up:    [name], MD [name] [place] Vanessa Christensen [id number]   DIAGNOSIS: Cancer Staging Breast cancer of upper-outer quadrant of left female breast [facility]) Staging form: Breast, AJCC 7th Edition - Pathologic stage from 10/31/2006: Stage IIB (T2, N49m, cM0) - Signed by FTruitt Merle MD on 01/11/2016   SUMMARY OF ONCOLOGIC HISTORY: Oncology History   Breast cancer of upper-outer quadrant of left female breast ([facility]   Staging form: Breast, AJCC 7th Edition   - Pathologic stage from 10/31/2006: Stage IIB (T2, N139m cM0) - Signed by YaTruitt MerleMD on 01/11/2016      [facility] [facility] [place]  09/25/2006 Initial Biopsy    Left breast 2:00 position mass core needle biopsy showed invasive carcinoma with overlapping features of lobular and ductal carcinoma, grade 2.      09/25/2006 Receptors her2    ER 3+ positive, PR 3+ positive, HER-2 equivocal on IHC, [facility] of amplification.      09/25/2006 Initial Diagnosis    Breast cancer of upper-outer quadrant of left female breast ([facility]     10/01/2006 Miscellaneous    BRCA1 and BRCA2 gene sequencing was negative for mutation.      10/31/2006 Surgery    Bilateral breast mastectomy and left sentinel lymph node biopsy      10/31/2006 Pathology Results    Left breast mastectomy showed 3.9 cm invasive lobular carcinoma, grade 2, surgical margins were negative, will follow 2 lymph nodes had microscopic metastasis, 2 axillary node were negative.      10/2006 - 10/2016 Anti-estrogen oral therapy    Adjuvant anastrozole 48m63maily       01/10/2007 - 08/08/2007 Chemotherapy    Adriamycin 120m74md Cytoxan 1200mg348mry 4 weeks for 4 cycles, followed by docetaxel 150mg 90m2 cycles.         09/09/2008 Imaging    PET scan showed no evidence of recurrence or metastatic breast cancer.      01/19/2016 Imaging    Bone  Density Scan The BMD measured at Femur Neck Right is 0.973 g/cm2 with a T-score of -0.5.       Genetic testing   08/08/2016 Initial Diagnosis    Genetic testing was negative for pathogenic variants in the 80 genes on the Multi-Gene Panel offered by [facility], included sequencing and/or deletion duplication testing of the following genes: ALK, APC, ATM, AXIN2,BAP1,  BARD1, BLM, BMPR1A, BRCA1, BRCA2, BRIP1, CASR, CDC73, CDH1, CDK4, CDKN1B, CDKN1C, CDKN2A (p14ARF), CDKN2A (p16INK4a), CEBPA, CHEK2, DICER1, CIS3L2, EGFR (c.2369C>T, p.Thr790Met variant only), EPCAM (Deletion/duplication testing only), FH, FLCN, GATA2, GPC3, GREM1 (Promoter region deletion/duplication testing only), HOXB13 (c.251G>A, p.Gly84Glu), HRAS, KIT, MAX, MEN1, MET, MITF (c.952G>A, p.Glu318Lys variant only), MLH1, MSH2, MSH6, MUTYH, NBN, NF1, NF2, PALB2, PDGFRA, PHOX2B, PMS2, POLD1, POLE, POT1, PRKAR1A, PTCH1, PTEN, RAD50, RAD51C, RAD51D, RB1, RECQL4, RET, RUNX1, SDHAF2, SDHA (sequence changes only), SDHB, SDHC, SDHD, SMAD4, SMARCA4, SMARCB1, SMARCE1, STK11, SUFU, TERT, TERT, TMEM127, TP53, TSC1, TSC2, VHL, WRN and WT1. Date of test report is 08/04/16.  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 No medical decisions should be made based on this result at this time given its unknown clinical significance.        CURRENT THERAPY: observation  INTERVAL HISTORY: Vanessa Wambleo72female returns for evaluation of her h/o breast cancer.  She is here for her yearly surveillance.  She is concerned today because she feels a lump, both in her axilla.  They have been present for the past month.  She noticed the smallest one first.  She says the larger lump worsens when she is doing curl exercises with her arms at the gym.  She notes that the area is mildly sore.  She denies any cat scratches/bites.  She does not have a cat.  No other sick contacts.  No open/healing skin lesions in area.   [name] is up to  date with colon cancer and skin cancer screening.  She has not had a pap smear in about 5 years if that.  No h/o abnormals.     Patient Active Problem List   Diagnosis Date Noted  . Left knee pain 10/25/2016  . Genetic testing 08/08/2016  . Hypothyroidism 12/02/2015  . Anxiety and depression 12/02/2015  . OAB (overactive bladder) 12/02/2015  . Breast cancer of upper-outer quadrant of left female breast ([place]) 12/02/2015  . Severe obesity (BMI >= 40) ([place]) 12/02/2015    is allergic to neulasta [pegfilgrastim].  MEDICAL HISTORY: Past Medical History:  Diagnosis Date  . Anxiety    pt denies  . Arthritis    hands and feet  . Cancer ([place]) 10/04/2006   breast  . Cataracts, both eyes   . Depression   . Diverticulosis of colon   . Glaucoma   . Heart murmur   . OSA on CPAP   . Sleep apnea   . Thyroid activity decreased     SURGICAL HISTORY: Past Surgical History:  Procedure Laterality Date  . BREAST SURGERY Bilateral   . CATARACT EXTRACTION    . GLAUCOMA REPAIR    . MASTECTOMY, RADICAL Bilateral   . neulasta induced sterile abscesses    . THYROIDECTOMY, PARTIAL    . TOTAL KNEE ARTHROPLASTY Right     SOCIAL HISTORY: Social History   Socioeconomic History  . Marital status: Married    Spouse name: Not on file  . Number of children: Not on file  . Years of education: Not on file  . Highest education level: Not on file  Social Needs  . Financial resource strain: Not on file  . Food insecurity - worry: Not on file  . Food insecurity - inability: Not on file  . Transportation needs - medical: Not on file  . Transportation needs - non-medical: Not on file  Occupational History  . Not on file  Tobacco Use  . Smoking status: Former Smoker    Last attempt to quit: 11/03/1978    Years since quitting: 38.7  . Smokeless tobacco: Never Used  Substance and Sexual Activity  . Alcohol use: No  . Drug use: No  . Sexual activity: Not on file  Other Topics Concern  . Not on  file  Social History Narrative  . Not on file    FAMILY HISTORY: Family History  Problem Relation Age of Onset  . CVA Mother   . Cancer Mother 33       breast cancer   . Leukemia Father   . Cancer Maternal Aunt 55       breast cancer   . Cancer Maternal Aunt 55       breast cancer  . Cancer Cousin 32       breast cancer    Review of Systems  Constitutional: Negative for appetite change, chills, fatigue, fever and unexpected weight change.  HENT:  Negative for hearing loss, lump/mass and trouble swallowing.   Eyes: Negative for eye problems and icterus.  Respiratory: Negative for chest tightness, cough and shortness of breath.   Cardiovascular: Negative for chest pain and leg swelling.  Gastrointestinal: Negative for abdominal distention, abdominal pain, constipation, diarrhea, nausea and vomiting.  Endocrine: Negative for hot flashes.  Musculoskeletal: Negative for arthralgias.  Skin: Negative for itching and rash.  Neurological: Negative for dizziness, extremity weakness, headaches and numbness.  Hematological: Positive for adenopathy (see interval history). Does not bruise/bleed easily.  Psychiatric/Behavioral: Negative for depression. The patient is not nervous/anxious.       PHYSICAL EXAMINATION  ECOG PERFORMANCE STATUS: 1 - Symptomatic but completely ambulatory  Vitals:   07/30/17 1143  BP: (!) 156/79  Pulse: 83  Resp: 18  Temp: 98.4 F (36.9 C)  SpO2: 98%    Physical Exam  Constitutional: She is oriented to person, place, and time and well-developed, well-nourished, and in no distress.  HENT:  Head: Normocephalic and atraumatic.  Mouth/Throat: Oropharynx is clear and moist. No oropharyngeal exudate.  Eyes: Pupils are equal, round, and reactive to light. No scleral icterus.  Neck: Neck supple.  Cardiovascular: Normal rate, regular rhythm and normal heart sounds.  Pulmonary/Chest: Effort normal and breath sounds normal.  S/p bilateral mastectomy, skin  folds present, no nodule/mass/tenderness noted bilatterally Right axilla: 1 cm axillary nodule  Abdominal: Soft. Bowel sounds are normal. She exhibits no distension. There is no tenderness. There is no guarding.  Musculoskeletal: She exhibits no edema.  Lymphadenopathy:    She has no cervical adenopathy.  Neurological: She is alert and oriented to person, place, and time. Coordination normal.  Skin: Skin is warm and dry. No rash noted.  Psychiatric: Mood and affect normal.    LABORATORY DATA:  CBC    Component Value Date/Time   WBC 7.0 07/30/2017 1119   RBC 4.41 07/30/2017 1119   HGB 12.1 07/30/2017 1119   HGB 12.2 07/13/2016 1015   HCT 37.1 07/30/2017 1119   HCT 36.9 07/13/2016 1015   PLT 182 07/30/2017 1119   PLT 183 07/13/2016 1015   MCV 84.2 07/30/2017 1119   MCV 84.3 07/13/2016 1015   MCH 27.5 07/30/2017 1119   MCHC 32.7 07/30/2017 1119   RDW 15.2 (H) 07/30/2017 1119   RDW 15.2 (H) 07/13/2016 1015   LYMPHSABS 1.2 07/30/2017 1119   LYMPHSABS 1.3 07/13/2016 1015   MONOABS 0.6 07/30/2017 1119   MONOABS 0.5 07/13/2016 1015   EOSABS 0.0 07/30/2017 1119   EOSABS 0.0 07/13/2016 1015   BASOSABS 0.0 07/30/2017 1119   BASOSABS 0.0 07/13/2016 1015    CMP     Component Value Date/Time   NA 137 07/30/2017 1119   NA 141 07/13/2016 1015   K 4.4 07/30/2017 1119   K 4.3 07/13/2016 1015   CL 104 07/30/2017 1119   CO2 25 07/30/2017 1119   CO2 24 07/13/2016 1015   GLUCOSE 90 07/30/2017 1119   GLUCOSE 89 07/13/2016 1015   BUN 17 07/30/2017 1119   BUN 17.2 07/13/2016 1015   CREATININE 0.81 07/30/2017 1119   CREATININE 0.7 07/13/2016 1015   CALCIUM 9.9 07/30/2017 1119   CALCIUM 9.6 07/13/2016 1015   PROT 7.2 07/30/2017 1119   PROT 6.7 07/13/2016 1015   ALBUMIN 4.2 07/30/2017 1119   ALBUMIN 4.0 07/13/2016 1015   AST 25 07/30/2017 1119   AST 22 07/13/2016 1015   ALT 30 07/30/2017 1119   ALT 28 07/13/2016 1015   ALKPHOS  81 07/30/2017 1119   ALKPHOS 70 07/13/2016 1015    BILITOT 0.6 07/30/2017 1119   BILITOT 0.57 07/13/2016 1015   GFRNONAA >60 07/30/2017 1119   GFRAA >60 07/30/2017 1119      ASSESSMENT and PLAN:   Breast cancer of upper-outer quadrant of left female breast ([place]) Angelisse is a 67 year old woman with h/o 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    1. Left sided breast cancer: She stopped taking Anastrozole in 10/2016.  She is doing well today.  She will continue with annual breast exams/surveillance and we will see her back in one year considering the nodule in her right axilla.  2. Right axillary nodule: I attempted to get the patient set up for right axillary ultrasound with the breast center today.  They cannot schedule her until they receive her initial mammogram images from when she was diagnosed back in 2008.  We asked that she go to [facility] after leaving here to sign a release so they can obtain the necessary images as not to delay her care.  If it takes too long to get this done we alternatively can [facility].      3. Health maintenance/Wellness: She will continue to f/u with her PCP.  She is up to date with her cancer screenings, which I encouraged her to continue with.  She was recommended to exercise 110mn/day, 150 min per week and eat healthy diet with plentiful fruits and vegetables.    JLuarawill return in one year for follow up with Dr. FBurr Medico       Orders Placed This Encounter  Procedures  . UK[place]AXILLA RIGHT    Standing Status:   Future    Standing Expiration Date:   09/28/2018    Order Specific Question:   Reason for Exam (SYMPTOM  OR DIAGNOSIS REQUIRED)    Answer:   right axillary nodule, h/o breast cancer s/p bilateral mastectomy    Order Specific Question:   [facility]    [facility]   All questions were  answered. The patient knows to call the clinic with any problems, questions or concerns. We can certainly see the patient much sooner if necessary.  A total of (30) minutes of face-to-face time was spent with this patient with greater than 50% of that time in counseling and care-coordination.  This note was electronically signed. LScot Dock NP 07/30/2017

## 2017-08-05 ENCOUNTER — Other Ambulatory Visit: Payer: Self-pay | Admitting: Family Medicine

## 2017-09-24 ENCOUNTER — Ambulatory Visit
Admission: RE | Admit: 2017-09-24 | Discharge: 2017-09-24 | Disposition: A | Discharge status: Home / Self Care | Payer: Medicare Other | Source: Ambulatory Visit | Attending: Adult Health | Admitting: Adult Health

## 2017-09-24 DIAGNOSIS — Z170 Estrogen receptor positive status [ER+]: Principal | ICD-10-CM

## 2017-09-24 DIAGNOSIS — M7989 Other specified soft tissue disorders: Secondary | ICD-10-CM | POA: Diagnosis not present

## 2017-09-24 DIAGNOSIS — C50412 Malignant neoplasm of upper-outer quadrant of left female breast: Secondary | ICD-10-CM

## 2017-10-08 ENCOUNTER — Other Ambulatory Visit: Payer: Self-pay | Admitting: Family Medicine

## 2017-11-07 ENCOUNTER — Encounter: Payer: Self-pay | Admitting: Family Medicine

## 2017-11-07 ENCOUNTER — Ambulatory Visit (INDEPENDENT_AMBULATORY_CARE_PROVIDER_SITE_OTHER): Payer: Medicare Other | Admitting: Family Medicine

## 2017-11-07 ENCOUNTER — Other Ambulatory Visit: Payer: Self-pay

## 2017-11-07 VITALS — BP 128/81 | HR 80 | Temp 98.0°F | Resp 16 | Ht 62.0 in | Wt 237.5 lb

## 2017-11-07 DIAGNOSIS — R42 Dizziness and giddiness: Secondary | ICD-10-CM | POA: Insufficient documentation

## 2017-11-07 DIAGNOSIS — E038 Other specified hypothyroidism: Secondary | ICD-10-CM

## 2017-11-07 DIAGNOSIS — R32 Unspecified urinary incontinence: Secondary | ICD-10-CM | POA: Insufficient documentation

## 2017-11-07 LAB — BASIC METABOLIC PANEL
BUN: 16 mg/dL (ref 6–23)
CALCIUM: 9.2 mg/dL (ref 8.4–10.5)
CO2: 22 mEq/L (ref 19–32)
Chloride: 106 mEq/L (ref 96–112)
Creatinine, Ser: 0.63 mg/dL (ref 0.40–1.20)
GFR: 99.6 mL/min (ref >60.00)
GLUCOSE: 94 mg/dL (ref 70–99)
Potassium: 4.3 mEq/L (ref 3.5–5.1)
SODIUM: 139 meq/L (ref 135–145)

## 2017-11-07 LAB — HEPATIC FUNCTION PANEL
ALT: 22 U/L (ref 0–35)
AST: 21 U/L (ref 0–37)
Albumin: 4.2 g/dL (ref 3.5–5.2)
Alkaline Phosphatase: 66 U/L (ref 39–117)
BILIRUBIN DIRECT: 0.1 mg/dL (ref 0.0–0.3)
TOTAL PROTEIN: 6.6 g/dL (ref 6.0–8.3)
Total Bilirubin: 0.5 mg/dL (ref 0.2–1.2)

## 2017-11-07 LAB — CBC WITH DIFFERENTIAL/PLATELET
BASOS ABS: 0.1 10*3/uL (ref 0.0–0.1)
Basophils Relative: 1.2 % (ref 0.0–3.0)
EOS PCT: 0.7 % (ref 0.0–5.0)
Eosinophils Absolute: 0 10*3/uL (ref 0.0–0.7)
HCT: 36.2 % (ref 36.0–46.0)
Hemoglobin: 12.1 g/dL (ref 12.0–15.0)
LYMPHS PCT: 24.9 % (ref 12.0–46.0)
Lymphs Abs: 1.4 10*3/uL (ref 0.7–4.0)
MCHC: 33.3 g/dL (ref 30.0–36.0)
MCV: 83.8 fl (ref 78.0–100.0)
MONOS PCT: 10.9 % (ref 3.0–12.0)
Monocytes Absolute: 0.6 10*3/uL (ref 0.1–1.0)
NEUTROS ABS: 3.5 10*3/uL (ref 1.4–7.7)
Neutrophils Relative %: 62.3 % (ref 43.0–77.0)
RBC: 4.32 Mil/uL (ref 3.87–5.11)
RDW: 16.1 % — ABNORMAL HIGH (ref 11.5–15.5)
WBC: 5.7 10*3/uL (ref 4.0–10.5)

## 2017-11-07 LAB — LIPID PANEL
Cholesterol: 158 mg/dL (ref 0–200)
HDL: 40.6 mg/dL (ref >39.00)
LDL CALC: 100 mg/dL — AB (ref 0–99)
NONHDL: 117.74
Total CHOL/HDL Ratio: 4
Triglycerides: 90 mg/dL (ref 0.0–149.0)
VLDL: 18 mg/dL (ref 0.0–40.0)

## 2017-11-07 LAB — TSH: TSH: 1.32 u[IU]/mL (ref 0.35–4.50)

## 2017-11-07 NOTE — Assessment & Plan Note (Signed)
Chronic problem.  Currently asymptomatic w/ exception of afternoon fatigue.  Check labs.  Adjust meds prn

## 2017-11-07 NOTE — Assessment & Plan Note (Signed)
Minimal relief w/ Detrol LA.  Given her reports of leaking upon standing, will refer to Urology for complete evaluation as this sounds like it may be pelvic floor related.  Pt expressed understanding and is in agreement w/ plan.

## 2017-11-07 NOTE — Progress Notes (Signed)
   Subjective:    Patient ID: Vanessa Christensen, female    DOB: Dec 29, 1950, 67 y.o.   MRN: [id number]  HPI Hypothyroid- ongoing issue for pt.  On Levothyroxine 30mcg daily.  + afternoon fatigue.  No changes to skin/hair/nails.  Obesity- ongoing issue.  Pt is down 2-3 lbs since last visit.  Pt is exercising regularly- going to 7am class 4-5x/week.  No CP, SOB, HAs, visual changes, edema.  Urinary incontinence- pt reports Detrol is 'helping some, but not very much'.  Pt reports she will leak when she stands after any period of sitting or lying.    Review of Systems For ROS see HPI     Objective:   Physical Exam  Constitutional: She is oriented to person, place, and time. She appears well-developed and well-nourished. No distress.  obese  HENT:  Head: Normocephalic and atraumatic.  Right Ear: Tympanic membrane normal.  Left Ear: Tympanic membrane normal.  Nose: Mucosal edema and rhinorrhea present. Right sinus exhibits no maxillary sinus tenderness and no frontal sinus tenderness. Left sinus exhibits no maxillary sinus tenderness and no frontal sinus tenderness.  Mouth/Throat: Mucous membranes are normal. Posterior oropharyngeal erythema (w/ PND) present.  Eyes: Pupils are equal, round, and reactive to light. Conjunctivae and EOM are normal.  Neck: Normal range of motion. Neck supple.  Cardiovascular: Normal rate, regular rhythm and normal heart sounds.  Pulmonary/Chest: Effort normal and breath sounds normal. No respiratory distress. She has no wheezes. She has no rales.  Abdominal: Soft. She exhibits no distension. There is no tenderness. There is no guarding.  Musculoskeletal: She exhibits no edema.  Lymphadenopathy:    She has no cervical adenopathy.  Neurological: She is alert and oriented to person, place, and time.  Skin: Skin is warm and dry.  Vitals reviewed.         Assessment & Plan:

## 2017-11-07 NOTE — Assessment & Plan Note (Signed)
Pt reports this is 'coming back'.  She has been to PT previously for this.  She has quite a bit of allergy congestion on exam.  Recommended daily antihistamine and if no improvement, will refer back to PT.  Pt expressed understanding and is in agreement w/ plan.

## 2017-11-07 NOTE — Patient Instructions (Signed)
Follow up in 6 months to recheck weight loss progress and thyroid We'll notify you of your lab results and make any changes if needed Continue to work on healthy diet and regular exercise- you can do it! We'll call you with your Urology appt ADD daily Claritin or Zyrtec to help improve the allergy congestion and hopefully the vertigo If no improvement in vertigo, MyChart me and let me know which PT you would like to see Call with any questions or concerns Happy Early Birthday!!!

## 2017-11-08 ENCOUNTER — Encounter: Payer: Self-pay | Admitting: General Practice

## 2017-11-15 DIAGNOSIS — H401131 Primary open-angle glaucoma, bilateral, mild stage: Secondary | ICD-10-CM | POA: Diagnosis not present

## 2018-01-17 DIAGNOSIS — N3946 Mixed incontinence: Secondary | ICD-10-CM | POA: Diagnosis not present

## 2018-01-21 DIAGNOSIS — N3946 Mixed incontinence: Secondary | ICD-10-CM | POA: Diagnosis not present

## 2018-01-21 DIAGNOSIS — M62838 Other muscle spasm: Secondary | ICD-10-CM | POA: Diagnosis not present

## 2018-01-21 DIAGNOSIS — M6281 Muscle weakness (generalized): Secondary | ICD-10-CM | POA: Diagnosis not present

## 2018-01-25 ENCOUNTER — Encounter: Payer: Self-pay | Admitting: Family Medicine

## 2018-01-25 ENCOUNTER — Other Ambulatory Visit: Payer: Self-pay

## 2018-01-25 ENCOUNTER — Ambulatory Visit (INDEPENDENT_AMBULATORY_CARE_PROVIDER_SITE_OTHER): Payer: Medicare Other | Admitting: Family Medicine

## 2018-01-25 VITALS — BP 118/72 | HR 88 | Temp 98.5°F | Resp 16 | Ht 62.0 in | Wt 238.8 lb

## 2018-01-25 DIAGNOSIS — R350 Frequency of micturition: Secondary | ICD-10-CM | POA: Diagnosis not present

## 2018-01-25 LAB — POCT URINALYSIS DIPSTICK
Bilirubin, UA: NEGATIVE
Blood, UA: NEGATIVE
GLUCOSE UA: NEGATIVE
Ketones, UA: NEGATIVE
NITRITE UA: NEGATIVE
PROTEIN UA: NEGATIVE
Spec Grav, UA: 1.025 (ref 1.010–1.025)
Urobilinogen, UA: 0.2 E.U./dL
pH, UA: 6 (ref 5.0–8.0)

## 2018-01-25 MED ORDER — CEPHALEXIN 500 MG PO CAPS: 500.0000 mg | ORAL_CAPSULE | Freq: Two times a day (BID) | ORAL | 0 refills | Status: AC

## 2018-01-25 NOTE — Progress Notes (Signed)
Running POCT UA today.

## 2018-01-25 NOTE — Patient Instructions (Signed)
Follow up as needed or as scheduled START the Cephalexin twice daily- take w/ food Drink plenty of fluids Call with any questions or concerns Have a great weekend!!

## 2018-01-25 NOTE — Progress Notes (Signed)
   Subjective:    Patient ID: Vanessa Christensen, female    DOB: [birth year], 67 y.o.   MRN: [id number]  HPI UTI- sxs started overnight Wednesday into Thursday w/ pt having to wake frequently to use the bathroom.  + suprapubic pain/pressure.  No burning w/ urination but 'pressure'.  Increased frequency, denies urgency, denies incomplete emptying.  + back pain.  Had temp of 101 yesterday.  No N/V.   Review of Systems For ROS see HPI     Objective:   Physical Exam  Constitutional: She is oriented to person, place, and time. She appears well-developed and well-nourished. No distress.  Abdominal: Soft. She exhibits no distension. There is tenderness (+ suprapubic but no CVA tenderness ).  Neurological: She is alert and oriented to person, place, and time.  Skin: Skin is warm and dry.  Psychiatric: She has a normal mood and affect. Her behavior is normal. Thought content normal.  Vitals reviewed.         Assessment & Plan:  Urinary frequency- new.  Pt's sxs are suspicious for UTI.  Start abx while awaiting culture.  Reviewed supportive care and red flags that should prompt return.  Pt expressed understanding and is in agreement w/ plan.

## 2018-01-27 LAB — URINE CULTURE
MICRO NUMBER: 91011565
SPECIMEN QUALITY:: ADEQUATE

## 2018-01-29 DIAGNOSIS — M6281 Muscle weakness (generalized): Secondary | ICD-10-CM | POA: Diagnosis not present

## 2018-01-29 DIAGNOSIS — M62838 Other muscle spasm: Secondary | ICD-10-CM | POA: Diagnosis not present

## 2018-01-29 DIAGNOSIS — N3946 Mixed incontinence: Secondary | ICD-10-CM | POA: Diagnosis not present

## 2018-02-03 ENCOUNTER — Other Ambulatory Visit: Payer: Self-pay | Admitting: Family Medicine

## 2018-02-14 DIAGNOSIS — N3946 Mixed incontinence: Secondary | ICD-10-CM | POA: Diagnosis not present

## 2018-02-14 DIAGNOSIS — M6281 Muscle weakness (generalized): Secondary | ICD-10-CM | POA: Diagnosis not present

## 2018-02-14 DIAGNOSIS — M62838 Other muscle spasm: Secondary | ICD-10-CM | POA: Diagnosis not present

## 2018-02-18 DIAGNOSIS — M6281 Muscle weakness (generalized): Secondary | ICD-10-CM | POA: Diagnosis not present

## 2018-02-18 DIAGNOSIS — M62838 Other muscle spasm: Secondary | ICD-10-CM | POA: Diagnosis not present

## 2018-02-18 DIAGNOSIS — N3946 Mixed incontinence: Secondary | ICD-10-CM | POA: Diagnosis not present

## 2018-03-04 ENCOUNTER — Other Ambulatory Visit: Payer: Self-pay

## 2018-03-04 ENCOUNTER — Ambulatory Visit (INDEPENDENT_AMBULATORY_CARE_PROVIDER_SITE_OTHER): Payer: Medicare Other | Admitting: Family Medicine

## 2018-03-04 ENCOUNTER — Encounter: Payer: Self-pay | Admitting: Family Medicine

## 2018-03-04 VITALS — BP 119/72 | HR 80 | Temp 98.1°F | Resp 16 | Ht 62.0 in | Wt 239.1 lb

## 2018-03-04 DIAGNOSIS — M26622 Arthralgia of left temporomandibular joint: Secondary | ICD-10-CM

## 2018-03-04 MED ORDER — TIZANIDINE HCL 4 MG PO TABS: 4.0000 mg | ORAL_TABLET | Freq: Three times a day (TID) | ORAL | 0 refills | Status: DC | PRN

## 2018-03-04 MED ORDER — MELOXICAM 15 MG PO TABS: 15.0000 mg | ORAL_TABLET | Freq: Every day | ORAL | 0 refills | Status: DC

## 2018-03-04 NOTE — Patient Instructions (Signed)
Follow up as needed or as scheduled START the Meloxicam once daily- take w/ food- for 7-10 days and then as needed AVOID ibuprofen, motrin, aleve, advil, etc while taking the Meloxicam TAKE the Tizanidine as needed for pain/spasm.  Definitely take before bed (may make you sleepy) and you can take throughout the day if needed but avoid driving Ice or heat- whichever feels better AVOID excessive chewing or opening mouth wide- this will aggravate your pain Call with any questions or concerns Hang in there!

## 2018-03-04 NOTE — Telephone Encounter (Signed)
Colonoscopy done 07/10/16 with Dr.Pyrtle.

## 2018-03-04 NOTE — Progress Notes (Signed)
   Subjective:    Patient ID: Vanessa Christensen, female    DOB: 09-06-1950, 67 y.o.   MRN: [id number]  HPI Jaw pain- sxs started Wednesday w/ L sided TMJ pain.  Saturday pain was extending to ear.  Difficulty opening mouth wide.  No recent trauma.  Some increased stress recently and pt admits to clenching teeth.  Denies opening excessively wide to eat.  Took ibuprofen w/o relief- 400mg  or 600mg .  Pain is throbbing.  No pain over temple.     Review of Systems For ROS see HPI     Objective:   Physical Exam  Constitutional: She appears well-developed and well-nourished. No distress.  HENT:  Head: Normocephalic and atraumatic.  Right Ear: External ear normal.  Left Ear: External ear normal.  Mouth/Throat: Oropharynx is clear and moist.  TMs WNL bilaterally No TTP over frontal or maxillary sinuses No TTP over temporal arteries  Eyes: Pupils are equal, round, and reactive to light.  Neck: Normal range of motion. Neck supple.  Lymphadenopathy:    She has no cervical adenopathy.  Skin: Skin is warm and dry.  Psychiatric: She has a normal mood and affect. Her behavior is normal. Thought content normal.  Vitals reviewed.         Assessment & Plan:  TMJ pain- new.  Reviewed dx and tx w/ pt.  No red flags for temporal arteritis.  Start scheduled NSAIDs, muscle relaxer prn.  Heat/ice.  Reviewed supportive care and red flags that should prompt return.  Pt expressed understanding and is in agreement w/ plan.

## 2018-04-14 ENCOUNTER — Other Ambulatory Visit: Payer: Self-pay | Admitting: Family Medicine

## 2018-05-08 ENCOUNTER — Encounter: Payer: Self-pay | Admitting: Family Medicine

## 2018-05-08 ENCOUNTER — Encounter: Payer: Self-pay | Admitting: General Practice

## 2018-05-08 ENCOUNTER — Ambulatory Visit (INDEPENDENT_AMBULATORY_CARE_PROVIDER_SITE_OTHER): Payer: Medicare Other | Admitting: Family Medicine

## 2018-05-08 ENCOUNTER — Other Ambulatory Visit: Payer: Self-pay

## 2018-05-08 VITALS — BP 120/73 | HR 78 | Temp 98.1°F | Resp 16 | Ht 62.0 in | Wt 243.4 lb

## 2018-05-08 DIAGNOSIS — F329 Major depressive disorder, single episode, unspecified: Secondary | ICD-10-CM

## 2018-05-08 DIAGNOSIS — E038 Other specified hypothyroidism: Secondary | ICD-10-CM | POA: Diagnosis not present

## 2018-05-08 DIAGNOSIS — F419 Anxiety disorder, unspecified: Secondary | ICD-10-CM

## 2018-05-08 DIAGNOSIS — F32A Depression, unspecified: Secondary | ICD-10-CM

## 2018-05-08 LAB — LIPID PANEL
Cholesterol: 170 mg/dL (ref 0–200)
HDL: 43.4 mg/dL (ref >39.00)
LDL Cholesterol: 103 mg/dL — ABNORMAL HIGH (ref 0–99)
NonHDL: 126.16
TRIGLYCERIDES: 114 mg/dL (ref 0.0–149.0)
Total CHOL/HDL Ratio: 4
VLDL: 22.8 mg/dL (ref 0.0–40.0)

## 2018-05-08 LAB — CBC WITH DIFFERENTIAL/PLATELET
Basophils Absolute: 0 10*3/uL (ref 0.0–0.1)
Basophils Relative: 0.8 % (ref 0.0–3.0)
EOS ABS: 0 10*3/uL (ref 0.0–0.7)
Eosinophils Relative: 0.5 % (ref 0.0–5.0)
HCT: 37.3 % (ref 36.0–46.0)
HEMOGLOBIN: 12.4 g/dL (ref 12.0–15.0)
Lymphocytes Relative: 27 % (ref 12.0–46.0)
Lymphs Abs: 1.3 10*3/uL (ref 0.7–4.0)
MCHC: 33.2 g/dL (ref 30.0–36.0)
MCV: 86.1 fl (ref 78.0–100.0)
MONO ABS: 0.5 10*3/uL (ref 0.1–1.0)
Monocytes Relative: 11.1 % (ref 3.0–12.0)
Neutro Abs: 2.9 10*3/uL (ref 1.4–7.7)
Neutrophils Relative %: 60.6 % (ref 43.0–77.0)
Platelets: 178 10*3/uL (ref 150.0–400.0)
RBC: 4.33 Mil/uL (ref 3.87–5.11)
RDW: 15.4 % (ref 11.5–15.5)
WBC: 4.7 10*3/uL (ref 4.0–10.5)

## 2018-05-08 LAB — HEPATIC FUNCTION PANEL
ALT: 23 U/L (ref 0–35)
AST: 21 U/L (ref 0–37)
Albumin: 4.4 g/dL (ref 3.5–5.2)
Alkaline Phosphatase: 66 U/L (ref 39–117)
Bilirubin, Direct: 0.1 mg/dL (ref 0.0–0.3)
Total Bilirubin: 0.4 mg/dL (ref 0.2–1.2)
Total Protein: 7.2 g/dL (ref 6.0–8.3)

## 2018-05-08 LAB — BASIC METABOLIC PANEL
BUN: 17 mg/dL (ref 6–23)
CHLORIDE: 104 meq/L (ref 96–112)
CO2: 26 meq/L (ref 19–32)
CREATININE: 0.66 mg/dL (ref 0.40–1.20)
Calcium: 9.3 mg/dL (ref 8.4–10.5)
GFR: 94.25 mL/min (ref >60.00)
Glucose, Bld: 82 mg/dL (ref 70–99)
Potassium: 4.5 mEq/L (ref 3.5–5.1)
Sodium: 138 mEq/L (ref 135–145)

## 2018-05-08 LAB — TSH: TSH: 1.05 u[IU]/mL (ref 0.35–4.50)

## 2018-05-08 MED ORDER — CLONAZEPAM 0.5 MG PO TABS: 0.5000 mg | ORAL_TABLET | Freq: Two times a day (BID) | ORAL | 1 refills | Status: DC | PRN

## 2018-05-08 NOTE — Assessment & Plan Note (Signed)
Chronic problem.  Currently asymptomatic.  Check labs.  Adjust meds prn  

## 2018-05-08 NOTE — Patient Instructions (Signed)
Follow up in 6 months to recheck thyroid and weight loss progress We'll notify you of your lab results and make any changes if needed Continue to work on healthy diet and regular exercise- you can do it! Continue the Lexapro as directed Add the Clonazepam as needed for high anxiety/panicked moments.  Start w/ 1/2 tab and increase if needed Call with any questions or concerns Happy Holidays!

## 2018-05-08 NOTE — Assessment & Plan Note (Signed)
Pt's anxiety is quite high but we discussed that most of her stress is coming from her voluntary participation in things.  She states things will calm down after the holidays and is asking for short term medication to help w/ anxiety.  Discussed the risks of using such medication.  Will start low dose Clonazepam as needed.  Pt expressed understanding and is in agreement w/ plan.

## 2018-05-08 NOTE — Assessment & Plan Note (Signed)
Ongoing issue for pt.  She has gained another 5 lbs.  Stressed need for healthy diet and regular exercise.  Pt is considering [employer].  I feel this would be good for her.  Check labs to risk stratify.  Will follow.

## 2018-05-08 NOTE — Progress Notes (Signed)
   Subjective:    Patient ID: Vanessa Christensen, female    DOB: 12-16-1950, 67 y.o.   MRN: [id number]  HPI Obesity- ongoing issue.  Pt has gained 5 lbs since last visit.  BMI is 44.5.  Pt is under considerable stress.  Only exercising once weekly.  No CP, SOB, HAs, visual changes.  Daughter is having success w/ [employer]  Hypothyroid- chronic problem, on Levothyroxine 41mcg daily.  Denies changes to skin/hair/nails.  'my life is so stressful right now'- anxiety and depression are worse due to current stressors.  Currently on Lexapro 30mg  daily.  High levels of anxiety are situational.  Review of Systems For ROS see HPI     Objective:   Physical Exam  Constitutional: She is oriented to person, place, and time. She appears well-developed and well-nourished. No distress.  obese  HENT:  Head: Normocephalic and atraumatic.  Eyes: Pupils are equal, round, and reactive to light. Conjunctivae and EOM are normal.  Neck: Normal range of motion. Neck supple. No thyromegaly present.  Cardiovascular: Normal rate, regular rhythm, normal heart sounds and intact distal pulses.  No murmur heard. Pulmonary/Chest: Effort normal and breath sounds normal. No respiratory distress.  Abdominal: Soft. She exhibits no distension. There is no tenderness.  Musculoskeletal: She exhibits no edema.  Lymphadenopathy:    She has no cervical adenopathy.  Neurological: She is alert and oriented to person, place, and time.  Skin: Skin is warm and dry.  Psychiatric: She has a normal mood and affect. Her behavior is normal.  Vitals reviewed.         Assessment & Plan:

## 2018-05-23 DIAGNOSIS — H401131 Primary open-angle glaucoma, bilateral, mild stage: Secondary | ICD-10-CM | POA: Diagnosis not present

## 2018-05-23 DIAGNOSIS — H52203 Unspecified astigmatism, bilateral: Secondary | ICD-10-CM | POA: Diagnosis not present

## 2018-06-09 ENCOUNTER — Other Ambulatory Visit: Payer: Self-pay | Admitting: Family Medicine

## 2018-07-29 ENCOUNTER — Other Ambulatory Visit: Payer: Self-pay | Admitting: Family Medicine

## 2018-07-29 NOTE — Progress Notes (Signed)
[place]   Telephone:(336) [phone] [phone]) [phone]   Clinic Follow up Note   Patient Care Team: [name], MD as PCP - General (Family Medicine) Katy Apo, MD as Consulting Physician (Ophthalmology) Truitt Merle, MD as Consulting Physician (Hematology) Rib[place] (Dentistry)  Date of Service:  07/31/2018  CHIEF COMPLAINT: F/u of left breast cancer  SUMMARY OF ONCOLOGIC HISTORY: Oncology History   Breast cancer of upper-outer quadrant of left female breast [facility])   Staging form: Breast, AJCC 7th Edition   - Pathologic stage from 10/31/2006: Stage IIB (T2, N56m, cM0) - Signed by YTruitt Merle MD on 01/11/2016      [place]ast [place] of upper-outer quadrant of left female breast ([facility]   09/25/2006 Initial Biopsy    Left breast 2:00 position mass core needle biopsy showed invasive carcinoma with overlapping features of lobular and ductal carcinoma, grade 2.    09/25/2006 Receptors her2    ER 3+ positive, PR 3+ positive, HER-2 equivocal on IHC, [facility] of [facility]    09/25/2006 Initial Diagnosis    [place] quadrant [place]    10/01/2006 Miscellaneous    BRCA1 and BRCA2 gene sequencing was negative for mutation.    10/31/2006 Surgery    Bilateral breast mastectomy and left sentinel lymph node biopsy    10/31/2006 Pathology Results    Left breast mastectomy showed 3.9 cm invasive lobular carcinoma, grade 2, surgical margins were negative, will follow 2 lymph nodes had microscopic metastasis, 2 axillary node were negative.    10/2006 - 10/2016 Anti-estrogen oral therapy    Adjuvant anastrozole 151mdaily     01/10/2007 - 08/08/2007 Chemotherapy    Adriamycin 12058mnd Cytoxan 1200m19mery 4 weeks for 4 cycles, followed by docetaxel 150mg69m 2 cycles.       09/09/2008 Imaging    PET scan showed no evidence of recurrence or metastatic breast cancer.    01/19/2016 Imaging   Bone Density Scan The BMD measured at Femur Neck Right is 0.973 g/cm2 with a T-score of -0.5.     Genetic testing   08/08/2016 Initial Diagnosis    Genetic testing was negative for pathogenic variants in the 80 genes on the Multi-Gene Panel offered by [facility], included sequencing and/or deletion duplication testing of the following genes: ALK, APC, ATM, AXIN2,BAP1,  BARD1, BLM, BMPR1A, BRCA1, BRCA2, BRIP1, CASR, CDC73, CDH1, CDK4, CDKN1B, CDKN1C, CDKN2A (p14ARF), CDKN2A (p16INK4a), CEBPA, CHEK2, DICER1, CIS3L2, EGFR (c.2369C>T, p.Thr790Met variant only), EPCAM (Deletion/duplication testing only), FH, FLCN, GATA2, GPC3, GREM1 (Promoter region deletion/duplication testing only), HOXB13 (c.251G>A, p.Gly84Glu), HRAS, KIT, MAX, MEN1, MET, MITF (c.952G>A, p.Glu318Lys variant only), MLH1, MSH2, MSH6, MUTYH, NBN, NF1, NF2, PALB2, PDGFRA, PHOX2B, PMS2, POLD1, POLE, POT1, PRKAR1A, PTCH1, PTEN, RAD50, RAD51C, RAD51D, RB1, RECQL4, RET, RUNX1, SDHAF2, SDHA (sequence changes only), SDHB, SDHC, SDHD, SMAD4, SMARCA4, SMARCB1, SMARCE1, STK11, SUFU, TERT, TERT, TMEM127, TP53, TSC1, TSC2, VHL, WRN and WT1. Date of test report is 08/04/16.  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 No medical decisions should be made based on this result at this time given its unknown clinical significance.       CURRENT THERAPY:  Surveillance   INTERVAL HISTORY:  Vanessa Lauricellaere for a follow up of left breast cancer. She was last seen by me 2 years ago and in interim she was seen by NP LindsWilber Bihariar ago.  She  presents to the clinic today by herself. She notes she is doing well. She notes she has gained more weight from eating. She notes when she exercises she will feel nodule like soreness in her chest.  She notes she has pain since right knee replacement.  She notes a mild stabbing pain in her Lower abdomen region after eating that last resolves soon after. She denies constipation  or Acid reflux. She does note to being gassy. She has colonoscopy every 3 years due to family medical history.    REVIEW OF SYSTEMS:   Constitutional: Denies fevers, chills (+) Weight gain  Eyes: Denies blurriness of vision Ears, nose, mouth, throat, and face: Denies mucositis or sore throat Respiratory: Denies cough, dyspnea or wheezes Cardiovascular: Denies palpitation, chest discomfort or lower extremity swelling Gastrointestinal:  Denies nausea, heartburn or change in bowel habits (+) gas (+) post prandial bowel cramps.  MSK: (+) Right knee pain since knee replacement Skin: Denies abnormal skin rashes Lymphatics: Denies new lymphadenopathy or easy bruising Neurological:Denies numbness, tingling or new weaknesses Behavioral/Psych: Mood is stable, no new changes  All other systems were reviewed with the patient and are negative.  MEDICAL HISTORY:  Past Medical History:  Diagnosis Date  . Anxiety    pt denies  . Arthritis    hands and feet  . Cancer (HCC) 10/04/2006   breast  . Cataracts, both eyes   . Depression   . Diverticulosis of colon   . Glaucoma   . Heart murmur   . OSA on CPAP   . Sleep apnea   . Thyroid activity decreased     SURGICAL HISTORY: Past Surgical History:  Procedure Laterality Date  . BREAST SURGERY Bilateral   . CATARACT EXTRACTION    . GLAUCOMA REPAIR    . MASTECTOMY, RADICAL Bilateral   . neulasta induced sterile abscesses    . THYROIDECTOMY, PARTIAL    . TOTAL KNEE ARTHROPLASTY Right     I have reviewed the social history and family history with the patient and they are unchanged from previous note.  ALLERGIES:  is allergic to neulasta [pegfilgrastim].  MEDICATIONS:  Current Outpatient Medications  Medication Sig Dispense Refill  . Ascorbic Acid (VITAMIN C ER PO) Take 1 tablet by mouth daily.     . aspirin 81 MG tablet Take 81 mg by mouth daily.    . AZOPT 1 % ophthalmic suspension PLACE 1 DROP INTO BOTH EYES TWICE DAILY  12  . b  complex vitamins tablet Take 1 tablet by mouth daily.    . escitalopram (LEXAPRO) 20 MG tablet TAKE 1 & 1/2 TABLETS BY MOUTH DAILY 135 tablet 3  . levothyroxine (SYNTHROID, LEVOTHROID) 75 MCG tablet TAKE 1 TABLET BY MOUTH EVERY DAY 90 tablet 1  . Multiple Vitamins-Minerals (MULTIVITAMIN ADULTS 50+ PO) Take 1 tablet by mouth daily.     . psyllium (METAMUCIL) 58.6 % powder Take 1 packet by mouth daily.     . tolterodine (DETROL LA) 4 MG 24 hr capsule TAKE 1 CAPSULE BY MOUTH EVERY DAY 30 capsule 6   No current facility-administered medications for this visit.     PHYSICAL EXAMINATION: ECOG PERFORMANCE STATUS: 0 - Asymptomatic  Vitals:   07/31/18 1040  BP: (!) 143/70  Pulse: 81  Resp: 18  Temp: 97.8 F (36.6 C)  SpO2: 99%   Filed Weights   07/31/18 1040  Weight: 243 lb (110.2 kg)    GENERAL:alert, no distress and comfortable SKIN: skin color, texture, turgor are   normal, no rashes or significant lesions EYES: normal, Conjunctiva are pink and non-injected, sclera clear OROPHARYNX:no exudate, no erythema and lips, buccal mucosa, and tongue normal  NECK: supple, thyroid normal size, non-tender, without nodularity LYMPH:  no palpable lymphadenopathy in the cervical, axillary or inguinal LUNGS: clear to auscultation and percussion with normal breathing effort HEART: regular rate & rhythm and no murmurs and no lower extremity edema ABDOMEN:abdomen soft, non-tender and normal bowel sounds Musculoskeletal:no cyanosis of digits and no clubbing  NEURO: alert & oriented x 3 with fluent speech, no focal motor/sensory deficits BREAST: S/p b/l mastectomy: Surgical incision with mild scar tissue of both breasts and soft tissue fullness (+) No Palpable mass or adenopathy of either breasts   LABORATORY DATA:  I have reviewed the data as listed CBC Latest Ref Rng & Units 07/31/2018 05/08/2018 11/07/2017  WBC 4.0 - 10.5 K/uL 4.6 4.7 5.7  Hemoglobin 12.0 - 15.0 g/dL 12.1 12.4 12.1  Hematocrit 36.0 -  46.0 % 39.0 37.3 36.2  Platelets 150 - 400 K/uL 190 178.0 168.0 Repeated and verified X2.     CMP Latest Ref Rng & Units 07/31/2018 05/08/2018 11/07/2017  Glucose 70 - 99 mg/dL 78 82 94  BUN 8 - 23 mg/dL 14 17 16  Creatinine 0.44 - 1.00 mg/dL 0.80 0.66 0.63  Sodium 135 - 145 mmol/L 140 138 139  Potassium 3.5 - 5.1 mmol/L 4.5 4.5 4.3  Chloride 98 - 111 mmol/L 105 104 106  CO2 22 - 32 mmol/L 26 26 22  Calcium 8.9 - 10.3 mg/dL 9.5 9.3 9.2  Total Protein 6.5 - 8.1 g/dL 7.3 7.2 6.6  Total Bilirubin 0.3 - 1.2 mg/dL 0.5 0.4 0.5  Alkaline Phos 38 - 126 U/L 87 66 66  AST 15 - 41 U/L 24 21 21  ALT 0 - 44 U/L 27 23 22      RADIOGRAPHIC STUDIES: I have personally reviewed the radiological images as listed and agreed with the findings in the report. No results found.   ASSESSMENT & PLAN:  [place] Wooley is a 67 y.o. female with   1. 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 She is s/p b/l breast mastectomy, adjuvant chemo AC, and anti-estrogen therapy with Anastrozole for 10 years.  -From a cancer standpoint she is clinically doing well. Lab reviewed, her CBC WNL and CMP WNL. Her physical exam was unremarkable. There is no clinical concern for recurrence. -Will get DEXA scan in 01/2019 -Continue Surveillance due to her small risk of later recurrence. She would like to continue to be seen by our clinic  -F/u yearly   2. Hypothyroidism, depression, obesity -She'll continue follow-up with her primary care physician -She has gained more weight since last visit. She is interested in losing weight, -I suggest she participate weight management clinic through [facility]. She is interested, I will make a referral today   3. Genetic Testing was normal and did not reveal a mutation in these genes.   Plan DEXA in 01/2019 at [facility]  Lab and f/u with Lacie in one year   No problem-specific  Assessment & Plan notes found for this encounter.   Orders Placed This Encounter  Procedures  . DG Bone Density    MCR/ BCBS PF:01/19/16@ [facility] WT: 243 PT ADVISED TO STOP CLAI- MULTI VITMS- 48 HRS APPT    Standing Status:   Future    Standing Expiration Date:     09/29/2019    Order Specific Question:   Reason for Exam (SYMPTOM  OR DIAGNOSIS REQUIRED)    Answer:   screening    Order Specific Question:   Preferred imaging location?    Answer:   [facility]   All questions were answered. The patient knows to call the clinic with any problems, questions or concerns. No barriers to learning was detected. I spent 15 minutes counseling the patient face to face. The total time spent in the appointment was 20 minutes and more than 50% was on counseling and review of test results     Vanessa Feng, MD 07/31/2018   I, Amoya Bennett, am acting as scribe for Vanessa Feng, MD.   I have reviewed the above documentation for accuracy and completeness, and I agree with the above.       

## 2018-07-31 ENCOUNTER — Inpatient Hospital Stay: Payer: Medicare Other | Attending: Hematology | Admitting: Hematology

## 2018-07-31 ENCOUNTER — Inpatient Hospital Stay: Payer: Medicare Other

## 2018-07-31 ENCOUNTER — Encounter: Payer: Self-pay | Admitting: Hematology

## 2018-07-31 ENCOUNTER — Other Ambulatory Visit: Payer: Self-pay

## 2018-07-31 ENCOUNTER — Telehealth: Payer: Self-pay | Admitting: Hematology

## 2018-07-31 VITALS — BP 143/70 | HR 81 | Temp 97.8°F | Resp 18 | Ht 62.0 in | Wt 243.0 lb

## 2018-07-31 DIAGNOSIS — Z9012 Acquired absence of left breast and nipple: Secondary | ICD-10-CM | POA: Insufficient documentation

## 2018-07-31 DIAGNOSIS — E2839 Other primary ovarian failure: Secondary | ICD-10-CM

## 2018-07-31 DIAGNOSIS — F329 Major depressive disorder, single episode, unspecified: Secondary | ICD-10-CM | POA: Insufficient documentation

## 2018-07-31 DIAGNOSIS — F32A Depression, unspecified: Secondary | ICD-10-CM

## 2018-07-31 DIAGNOSIS — Z79899 Other long term (current) drug therapy: Secondary | ICD-10-CM | POA: Diagnosis not present

## 2018-07-31 DIAGNOSIS — E039 Hypothyroidism, unspecified: Secondary | ICD-10-CM | POA: Diagnosis not present

## 2018-07-31 DIAGNOSIS — F419 Anxiety disorder, unspecified: Secondary | ICD-10-CM

## 2018-07-31 DIAGNOSIS — E669 Obesity, unspecified: Secondary | ICD-10-CM

## 2018-07-31 DIAGNOSIS — Z6841 Body Mass Index (BMI) 40.0 and over, adult: Secondary | ICD-10-CM

## 2018-07-31 DIAGNOSIS — Z170 Estrogen receptor positive status [ER+]: Principal | ICD-10-CM

## 2018-07-31 DIAGNOSIS — C50412 Malignant neoplasm of upper-outer quadrant of left female breast: Secondary | ICD-10-CM | POA: Diagnosis not present

## 2018-07-31 LAB — COMPREHENSIVE METABOLIC PANEL
ALT: 27 U/L (ref 0–44)
AST: 24 U/L (ref 15–41)
Albumin: 4.1 g/dL (ref 3.5–5.0)
Alkaline Phosphatase: 87 U/L (ref 38–126)
Anion gap: 9 (ref 5–15)
BILIRUBIN TOTAL: 0.5 mg/dL (ref 0.3–1.2)
BUN: 14 mg/dL (ref 8–23)
CHLORIDE: 105 mmol/L (ref 98–111)
CO2: 26 mmol/L (ref 22–32)
CREATININE: 0.8 mg/dL (ref 0.44–1.00)
Calcium: 9.5 mg/dL (ref 8.9–10.3)
GFR calc Af Amer: >60 mL/min (ref >60)
Glucose, Bld: 78 mg/dL (ref 70–99)
Potassium: 4.5 mmol/L (ref 3.5–5.1)
Sodium: 140 mmol/L (ref 135–145)
Total Protein: 7.3 g/dL (ref 6.5–8.1)

## 2018-07-31 LAB — CBC WITH DIFFERENTIAL/PLATELET
Abs Immature Granulocytes: 0.01 10*3/uL (ref 0.00–0.07)
Basophils Absolute: 0 10*3/uL (ref 0.0–0.1)
Basophils Relative: 1 %
EOS PCT: 0 %
Eosinophils Absolute: 0 10*3/uL (ref 0.0–0.5)
HEMATOCRIT: 39 % (ref 36.0–46.0)
HEMOGLOBIN: 12.1 g/dL (ref 12.0–15.0)
Immature Granulocytes: 0 %
LYMPHS PCT: 29 %
Lymphs Abs: 1.3 10*3/uL (ref 0.7–4.0)
MCH: 27.6 pg (ref 26.0–34.0)
MCHC: 31 g/dL (ref 30.0–36.0)
MCV: 89 fL (ref 80.0–100.0)
MONO ABS: 0.5 10*3/uL (ref 0.1–1.0)
MONOS PCT: 12 %
Neutro Abs: 2.6 10*3/uL (ref 1.7–7.7)
Neutrophils Relative %: 58 %
PLATELETS: 190 10*3/uL (ref 150–400)
RBC: 4.38 MIL/uL (ref 3.87–5.11)
RDW: 14.2 % (ref 11.5–15.5)
WBC: 4.6 10*3/uL (ref 4.0–10.5)
nRBC: 0 % (ref 0.0–0.2)

## 2018-07-31 NOTE — Telephone Encounter (Signed)
Scheduled appt per 2/26 los.  Called weight loss management, the referral scheduling there gave me her number to give to the patient, so the patient can call her and she will schedule the appt with the patient, and she will be keeping a look on the referral also.

## 2018-07-31 NOTE — Progress Notes (Signed)
ambulat

## 2018-08-01 ENCOUNTER — Encounter (INDEPENDENT_AMBULATORY_CARE_PROVIDER_SITE_OTHER): Payer: Self-pay

## 2018-08-13 ENCOUNTER — Encounter (INDEPENDENT_AMBULATORY_CARE_PROVIDER_SITE_OTHER): Payer: Self-pay | Admitting: Bariatrics

## 2018-08-13 ENCOUNTER — Ambulatory Visit (INDEPENDENT_AMBULATORY_CARE_PROVIDER_SITE_OTHER): Payer: Medicare Other | Admitting: Bariatrics

## 2018-08-13 VITALS — BP 137/76 | HR 76 | Temp 97.6°F | Ht 62.0 in | Wt 241.0 lb

## 2018-08-13 DIAGNOSIS — H4089 Other specified glaucoma: Secondary | ICD-10-CM

## 2018-08-13 DIAGNOSIS — E559 Vitamin D deficiency, unspecified: Secondary | ICD-10-CM

## 2018-08-13 DIAGNOSIS — F3289 Other specified depressive episodes: Secondary | ICD-10-CM

## 2018-08-13 DIAGNOSIS — G4733 Obstructive sleep apnea (adult) (pediatric): Secondary | ICD-10-CM

## 2018-08-13 DIAGNOSIS — Z6841 Body Mass Index (BMI) 40.0 and over, adult: Secondary | ICD-10-CM

## 2018-08-13 DIAGNOSIS — G8929 Other chronic pain: Secondary | ICD-10-CM

## 2018-08-13 DIAGNOSIS — R7309 Other abnormal glucose: Secondary | ICD-10-CM | POA: Diagnosis not present

## 2018-08-13 DIAGNOSIS — R0602 Shortness of breath: Secondary | ICD-10-CM

## 2018-08-13 DIAGNOSIS — M25562 Pain in left knee: Secondary | ICD-10-CM | POA: Diagnosis not present

## 2018-08-13 DIAGNOSIS — M25561 Pain in right knee: Secondary | ICD-10-CM

## 2018-08-13 DIAGNOSIS — R5383 Other fatigue: Secondary | ICD-10-CM | POA: Diagnosis not present

## 2018-08-13 DIAGNOSIS — E038 Other specified hypothyroidism: Secondary | ICD-10-CM | POA: Diagnosis not present

## 2018-08-13 DIAGNOSIS — Z0289 Encounter for other administrative examinations: Secondary | ICD-10-CM

## 2018-08-13 NOTE — Progress Notes (Signed)
shortnes

## 2018-08-14 ENCOUNTER — Encounter (INDEPENDENT_AMBULATORY_CARE_PROVIDER_SITE_OTHER): Payer: Self-pay | Admitting: Bariatrics

## 2018-08-14 DIAGNOSIS — G4733 Obstructive sleep apnea (adult) (pediatric): Secondary | ICD-10-CM | POA: Insufficient documentation

## 2018-08-14 DIAGNOSIS — Z6841 Body Mass Index (BMI) 40.0 and over, adult: Secondary | ICD-10-CM

## 2018-08-14 DIAGNOSIS — H4089 Other specified glaucoma: Secondary | ICD-10-CM | POA: Insufficient documentation

## 2018-08-14 LAB — HEMOGLOBIN A1C
ESTIMATED AVERAGE GLUCOSE: 114 mg/dL
HEMOGLOBIN A1C: 5.6 % (ref 4.8–5.6)

## 2018-08-14 LAB — INSULIN, RANDOM: INSULIN: 13.2 u[IU]/mL (ref 2.6–24.9)

## 2018-08-14 LAB — VITAMIN D 25 HYDROXY (VIT D DEFICIENCY, FRACTURES): Vit D, 25-Hydroxy: 35 ng/mL (ref 30.0–100.0)

## 2018-08-14 NOTE — Progress Notes (Signed)
Office: [phone]  /  Fax: [phone]   Dear Dr. [name],   Thank you for referring Vanessa Christensen to our clinic. The following note includes my evaluation and treatment recommendations.  HPI:   Chief Complaint: OBESITY    [name] has been referred by Aundra Millet. [name], MD for consultation regarding her obesity and obesity related comorbidities.    Vanessa Christensen (MR# [id number]) is a 68 y.o. female who presents on 08/14/2018 for obesity evaluation and treatment. Current BMI is Body mass index is 44.08 kg/m.Vanessa Christensen Vanessa Christensen has been struggling with her weight for many years and has been unsuccessful in either losing weight, maintaining weight loss, or reaching her healthy weight goal.     Vanessa Christensen attended our information session and states she is currently in the action stage of change and ready to dedicate time achieving and maintaining a healthier weight. [name] is interested in becoming our patient and working on intensive lifestyle modifications including (but not limited to) diet, exercise and weight loss.    [name] states her family eats meals together she thinks her family will eat healthier with her her desired weight loss is 91 lbs. she has been heavy most of  her life she started gaining weight after childbirth her heaviest weight ever was 243 lbs. she does like to cook she craves pasta and pizza she dislikes fish and liver  she frequently makes poor food choices she frequently eats larger portions than normal  she struggles with emotional eating    Vanessa Christensen feels her energy is lower than it should be. This has worsened with weight gain and has not worsened recently. Vanessa Christensen admits to daytime somnolence and she denies waking up still tired. Patient has a diagnosis of  obstructive sleep apnea which may contribute to her Vanessa. Patent has a history of symptoms of daytime Vanessa. Patient generally gets 7 or 8 hours of sleep per night, and states they generally have  restful sleep. Snoring is present. Apneic episodes are present. Epworth Sleepiness Score is 13  Dyspnea on exertion Vanessa Christensen notes increasing shortness of breath with certain activities (climbing stairs) and seems to be worsening over time with weight gain. She notes getting out of breath sooner with activity than she used to. This has not gotten worse recently. Vanessa Christensen denies orthopnea.  Hypothyroidism Vanessa Christensen has a diagnosis of hypothyroidism. Her TSH has been normal. She is taking synthroid. She denies hot or cold intolerance or palpitations.  Knee Pain (bilateral) Vanessa Christensen has bilateral knee pain. 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 She has minor pain in the left knee. Vanessa Christensen is taking Ibuprofen at night.  OSA (obstructive sleep apnea) Vanessa Christensen has a diagnosis of obstructive sleep apnea and she wears a CPAP. She has decreased Vanessa with CPAP.  Glaucoma Vanessa Christensen has a diagnosis of glaucoma. She was diagnosed two years ago with open angle glaucoma. Vanessa Christensen is currently using eye drops.  Vitamin D deficiency Vanessa Christensen has a diagnosis of vitamin D deficiency. She is not currently taking vit D and denies nausea, vomiting or muscle weakness.  Elevated Glucose Vanessa Christensen has a history of some elevated blood glucose readings without a diagnosis of diabetes. She denies polyphagia.  Depression with emotional eating behaviors Vanessa Christensen is struggling with emotional eating and using food for comfort to the extent that it is negatively impacting her health. She often snacks when she is not hungry. Vanessa Christensen sometimes feels she is out of control and then feels guilty  that she made poor food choices. Vanessa Christensen is currently taking Lexapro. She is attempting to work on behavior modification techniques to help reduce her emotional eating. She shows no sign of suicidal or homicidal ideations.  Depression Screen Vanessa Christensen's Food and Mood (modified PHQ-9) score was  Depression screen PHQ  2/9 08/13/2018  Decreased Interest -  Down, Depressed, Hopeless 2  PHQ - 2 Score 2  Altered sleeping 3  Tired, decreased energy 3  Change in appetite 1  Feeling bad or failure about yourself  3  Trouble concentrating 0  Moving slowly or fidgety/restless 3  Suicidal thoughts 1  PHQ-9 Score 16  Difficult doing work/chores Somewhat difficult    ASSESSMENT AND PLAN:  Other Vanessa - Plan: EKG 12-Lead  Shortness of breath on exertion  OSA (obstructive sleep apnea)  Other specified hypothyroidism  Chronic pain of both knees  Other specified glaucoma, unspecified laterality  Elevated glucose - Plan: Hemoglobin A1c, Insulin, random  Vitamin D deficiency - Plan: VITAMIN D 25 Hydroxy (Vit-D Deficiency, Fractures)  Other depression - with emotional eating  Class 3 severe obesity with serious comorbidity and body mass index (BMI) of 40.0 to 44.9 in adult, unspecified obesity type (HCC)  PLAN:  Vanessa Vanessa Christensen was informed that her Vanessa may be related to obesity, depression or many other causes. Labs will be ordered, and in the meanwhile Vanessa Christensen has agreed to work on diet, exercise and weight loss to help with Vanessa. Proper sleep hygiene was discussed including the need for 7-8 hours of quality sleep each night.   Dyspnea on exertion Vanessa Christensen's shortness of breath appears to be obesity related and exercise induced. She has agreed to work on weight loss and gradually increase exercise to treat her exercise induced shortness of breath. If Vanessa Christensen follows our instructions and loses weight without improvement of her shortness of breath, we will plan to refer to pulmonology. We will monitor this condition regularly. Vanessa Christensen agrees to this plan.  Hypothyroidism Vanessa Christensen was informed of the importance of good thyroid control to help with weight loss efforts. She was also informed that supertheraputic thyroid levels are dangerous and will not improve weight loss results. Vanessa Christensen will continue  the synthroid and follow up as directed.  Knee Pain (bilateral) Vanessa Christensen will avoid pounding exercises and she will do no running. Vanessa Christensen agrees to follow up as directed.  OSA (obstructive sleep apnea) Liberti will continue to wear her CPAP and she will follow up with our clinic in 2 weeks.  Glaucoma Kaianna will follow up yearly for her follow up with her ophthalmologist. She will follow up with our clinic in 2 weeks.  Vitamin D Deficiency Lula was informed that low vitamin D levels contributes to Vanessa and are associated with obesity, breast, and colon cancer. We will check vitamin D level today and she will follow up for routine testing of vitamin D, at least 2-3 times per year. Wonder will be having bone density that was scheduled by her PCP.  Elevated Glucose Fasting labs will be obtained (Hgb A1c, insulin level) and results with be discussed with Vanessa Christensen in 2 weeks at her follow up visit. In the meanwhile Clea was started on a lower simple carbohydrate diet and will work on weight loss efforts.  Depression with Emotional Eating Behaviors We discussed behavior modification techniques today to help Venola deal with her emotional eating and depression. We will refer Vanessa Christensen to Dr. Mallie Mussel our bariatric psychologist.  Depression Screen Dafney had a strongly positive depression screening. Depression  is commonly associated with obesity and often results in emotional eating behaviors. We will monitor this closely and work on CBT to help improve the non-hunger eating patterns.   Obesity Atalie is currently in the action stage of change and her goal is to continue with weight loss efforts. I recommend Shavaun begin the structured treatment plan as follows:  She has agreed to follow the Category 4 plan  Zeppelin has been instructed to eventually work up to a goal of 150 minutes of combined cardio and strengthening exercise per week for weight loss and overall health benefits. We discussed the  following Behavioral Modification Strategies today: increase H2O intake, no skipping meals, keeping healthy foods in the home, increasing lean protein intake, decreasing simple carbohydrates, increasing vegetables, decrease eating out and work on meal planning and easy cooking plans   She was informed of the importance of frequent follow up visits to maximize her success with intensive lifestyle modifications for her multiple health conditions. She was informed we would discuss her lab results at her next visit unless there is a critical issue that needs to be addressed sooner. Maggy agreed to keep her next visit at the agreed upon time to discuss these results.  ALLERGIES: Allergies  Allergen Reactions   Neulasta [Pegfilgrastim] Other (See Comments)    Raised lumps on legs, arms - had to be excised =  Happened every time pt received Neulasta.    MEDICATIONS: Current Outpatient Medications on File Prior to Visit  Medication Sig Dispense Refill   Ascorbic Acid (VITAMIN C ER PO) Take 1 tablet by mouth daily.      aspirin 81 MG tablet Take 81 mg by mouth daily.     AZOPT 1 % ophthalmic suspension PLACE 1 DROP INTO BOTH EYES TWICE DAILY  12   b complex vitamins tablet Take 1 tablet by mouth daily.     escitalopram (LEXAPRO) 20 MG tablet TAKE 1 & 1/2 TABLETS BY MOUTH DAILY 135 tablet 3   levothyroxine (SYNTHROID, LEVOTHROID) 75 MCG tablet TAKE 1 TABLET BY MOUTH EVERY DAY 90 tablet 1   Multiple Vitamins-Minerals (MULTIVITAMIN ADULTS 50+ PO) Take 1 tablet by mouth daily.      psyllium (METAMUCIL) 58.6 % powder Take 1 packet by mouth daily.      tolterodine (DETROL LA) 4 MG 24 hr capsule TAKE 1 CAPSULE BY MOUTH EVERY DAY 30 capsule 6   No current facility-administered medications on file prior to visit.     PAST MEDICAL HISTORY: Past Medical History:  Diagnosis Date   Anxiety    pt denies   Arthritis    hands and feet   Cancer ([place]) 10/04/2006   breast   Cataracts, both eyes     Depression    Depression    Diverticulosis of colon    Glaucoma    Heart murmur    Joint pain    Knee pain    Obesity    OSA on CPAP    Osteoarthritis    Sleep apnea    Thyroid activity decreased     PAST SURGICAL HISTORY: Past Surgical History:  Procedure Laterality Date   BREAST SURGERY Bilateral    CATARACT EXTRACTION     GLAUCOMA REPAIR     MASTECTOMY, RADICAL Bilateral    neulasta induced sterile abscesses     THYROIDECTOMY, PARTIAL     TOTAL KNEE ARTHROPLASTY Right     SOCIAL HISTORY: Social History   Tobacco Use   Smoking status: Former Smoker  Last attempt to quit: 11/03/1978    Years since quitting: 39.8   Smokeless tobacco: Never Used  Substance Use Topics   Alcohol use: No   Drug use: No    FAMILY HISTORY: Family History  Problem Relation Age of Onset   CVA Mother    Cancer Mother 35       breast cancer    Heart disease Mother    Stroke Mother    Obesity Mother    Leukemia Father    High blood pressure Father    High Cholesterol Father    Heart disease Father    Cancer Maternal Aunt 56       breast cancer    Cancer Maternal Aunt 29       breast cancer   Cancer Cousin 32       breast cancer    ROS: Review of Systems  Constitutional: Positive for malaise/Vanessa.  HENT: Positive for congestion (nasal stuffiness).   Eyes:       + Wear Glasses or Contacts + Floaters  Respiratory: Positive for cough and shortness of breath (with activity).   Cardiovascular: Negative for palpitations and orthopnea.  Gastrointestinal: Negative for nausea and vomiting.  Genitourinary: Positive for frequency.  Musculoskeletal:       + Muscle or Joint Pain + Knee Pain Negative for muscle weakness  Neurological: Positive for dizziness.  Endo/Heme/Allergies:       Negative for heat or cold intolerance Negative for polyphagia  Psychiatric/Behavioral: Positive for depression. Negative for suicidal ideas.    PHYSICAL  EXAM: Blood pressure 137/76, pulse 76, temperature 97.6 F (36.4 C), temperature source Oral, height 5\' 2"  (1.575 m), weight 241 lb (109.3 kg), SpO2 98 %. Body mass index is 44.08 kg/m. Physical Exam Vitals signs reviewed.  Constitutional:      Appearance: Normal appearance. She is well-developed. She is obese.  HENT:     Head: Normocephalic and atraumatic.     Nose: Nose normal.     Mouth/Throat:     Comments: Mallampati = 1-2 Eyes:     General: No scleral icterus.    Extraocular Movements: Extraocular movements intact.  Neck:     Musculoskeletal: Normal range of motion and neck supple.     Thyroid: No thyromegaly.  Cardiovascular:     Rate and Rhythm: Normal rate and regular rhythm.  Pulmonary:     Effort: Pulmonary effort is normal. No respiratory distress.  Abdominal:     Palpations: Abdomen is soft.     Tenderness: There is no abdominal tenderness.  Musculoskeletal: Normal range of motion.     Right lower leg: Edema (trace) present.     Left lower leg: Edema (trace) present.     Comments: Range of Motion normal in all 4 extremities  Skin:    General: Skin is warm and dry.  Neurological:     Mental Status: She is alert and oriented to person, place, and time.     Coordination: Coordination normal.  Psychiatric:        Mood and Affect: Mood normal.        Behavior: Behavior normal.     RECENT LABS AND TESTS: BMET    Component Value Date/Time   NA 140 07/31/2018 1013   NA 141 07/13/2016 1015   K 4.5 07/31/2018 1013   K 4.3 07/13/2016 1015   CL 105 07/31/2018 1013   CO2 26 07/31/2018 1013   CO2 24 07/13/2016 1015   GLUCOSE 78 07/31/2018 1013  GLUCOSE 89 07/13/2016 1015   BUN 14 07/31/2018 1013   BUN 17.2 07/13/2016 1015   CREATININE 0.80 07/31/2018 1013   CREATININE 0.7 07/13/2016 1015   CALCIUM 9.5 07/31/2018 1013   CALCIUM 9.6 07/13/2016 1015   GFRNONAA >60 07/31/2018 1013   GFRAA >60 07/31/2018 1013   Lab Results  Component Value Date   HGBA1C  5.6 08/13/2018   Lab Results  Component Value Date   INSULIN 13.2 08/13/2018   CBC    Component Value Date/Time   WBC 4.6 07/31/2018 1013   RBC 4.38 07/31/2018 1013   HGB 12.1 07/31/2018 1013   HGB 12.2 07/13/2016 1015   HCT 39.0 07/31/2018 1013   HCT 36.9 07/13/2016 1015   PLT 190 07/31/2018 1013   PLT 183 07/13/2016 1015   MCV 89.0 07/31/2018 1013   MCV 84.3 07/13/2016 1015   MCH 27.6 07/31/2018 1013   MCHC 31.0 07/31/2018 1013   RDW 14.2 07/31/2018 1013   RDW 15.2 (H) 07/13/2016 1015   LYMPHSABS 1.3 07/31/2018 1013   LYMPHSABS 1.3 07/13/2016 1015   MONOABS 0.5 07/31/2018 1013   MONOABS 0.5 07/13/2016 1015   EOSABS 0.0 07/31/2018 1013   EOSABS 0.0 07/13/2016 1015   BASOSABS 0.0 07/31/2018 1013   BASOSABS 0.0 07/13/2016 1015   Iron/TIBC/Ferritin/ %Sat No results found for: IRON, TIBC, FERRITIN, IRONPCTSAT Lipid Panel     Component Value Date/Time   CHOL 170 05/08/2018 0957   TRIG 114.0 05/08/2018 0957   HDL 43.40 05/08/2018 0957   CHOLHDL 4 05/08/2018 0957   VLDL 22.8 05/08/2018 0957   LDLCALC 103 (H) 05/08/2018 0957   Hepatic Function Panel     Component Value Date/Time   PROT 7.3 07/31/2018 1013   PROT 6.7 07/13/2016 1015   ALBUMIN 4.1 07/31/2018 1013   ALBUMIN 4.0 07/13/2016 1015   AST 24 07/31/2018 1013   AST 22 07/13/2016 1015   ALT 27 07/31/2018 1013   ALT 28 07/13/2016 1015   ALKPHOS 87 07/31/2018 1013   ALKPHOS 70 07/13/2016 1015   BILITOT 0.5 07/31/2018 1013   BILITOT 0.57 07/13/2016 1015   BILIDIR 0.1 05/08/2018 0957      Component Value Date/Time   TSH 1.05 05/08/2018 0957   TSH 1.32 11/07/2017 0921   TSH 0.86 05/09/2017 1006    ECG  shows NSR with a rate of 77 BPM INDIRECT CALORIMETER done today shows a VO2 of 310 and a REE of 2159.  Her calculated basal metabolic rate is 6063 thus her basal metabolic rate is better than expected.    OBESITY BEHAVIORAL INTERVENTION VISIT  Today's visit was # 1   Starting weight: 241  lbs Starting date: 08/13/2018 Today's weight : 241 lbs Today's date: 08/13/2018 Total lbs lost to date: 0 At least 15 minutes were spent on discussing the following behavioral intervention visit.    08/13/2018  Height 5\' 2"  (1.575 m)  Weight 241 lb (109.3 kg)  BMI (Calculated) 44.07  BLOOD PRESSURE - SYSTOLIC 016  BLOOD PRESSURE - DIASTOLIC 76  Waist Measurement  50 inches   Body Fat % 50.8 %  Total Body Water (lbs) 82.2 lbs  RMR 2159    ASK: We discussed the diagnosis of obesity with [name] today and Phelicia agreed to give [name] permission to discuss obesity behavioral modification therapy today.  ASSESS: Zariyah has the diagnosis of obesity and her BMI today is 44.07 Malak is in the action stage of change   ADVISE: Solomia was educated  on the multiple health risks of obesity as well as the benefit of weight loss to improve her health. She was advised of the need for long term treatment and the importance of lifestyle modifications to improve her current health and to decrease her risk of future health problems.  AGREE: Multiple dietary modification options and treatment options were discussed and  Gertrue agreed to follow the recommendations documented in the above note.  ARRANGE: Noor was educated on the importance of frequent visits to treat obesity as outlined per CMS and USPSTF guidelines and agreed to schedule her next follow up appointment today.  Corey Skains, am acting as Location manager for [employer]. [name], DO  I have reviewed the above documentation for accuracy and completeness, and I agree with the above. -Jearld Lesch, DO

## 2018-08-26 ENCOUNTER — Encounter (INDEPENDENT_AMBULATORY_CARE_PROVIDER_SITE_OTHER): Payer: Self-pay

## 2018-08-27 ENCOUNTER — Encounter (INDEPENDENT_AMBULATORY_CARE_PROVIDER_SITE_OTHER): Payer: Self-pay | Admitting: Bariatrics

## 2018-08-27 ENCOUNTER — Ambulatory Visit (INDEPENDENT_AMBULATORY_CARE_PROVIDER_SITE_OTHER): Payer: Medicare Other | Admitting: Bariatrics

## 2018-08-27 ENCOUNTER — Encounter (INDEPENDENT_AMBULATORY_CARE_PROVIDER_SITE_OTHER): Payer: Self-pay

## 2018-08-27 ENCOUNTER — Other Ambulatory Visit: Payer: Self-pay

## 2018-08-27 DIAGNOSIS — E8881 Metabolic syndrome: Secondary | ICD-10-CM | POA: Diagnosis not present

## 2018-08-27 DIAGNOSIS — Z6841 Body Mass Index (BMI) 40.0 and over, adult: Secondary | ICD-10-CM

## 2018-08-27 DIAGNOSIS — F3289 Other specified depressive episodes: Secondary | ICD-10-CM | POA: Diagnosis not present

## 2018-08-27 DIAGNOSIS — E559 Vitamin D deficiency, unspecified: Secondary | ICD-10-CM

## 2018-08-27 MED ORDER — VITAMIN D (ERGOCALCIFEROL) 1.25 MG (50000 UNIT) PO CAPS: 50000.0000 [IU] | ORAL_CAPSULE | ORAL | 0 refills | Status: DC

## 2018-08-28 ENCOUNTER — Encounter (INDEPENDENT_AMBULATORY_CARE_PROVIDER_SITE_OTHER): Payer: Self-pay

## 2018-08-28 ENCOUNTER — Ambulatory Visit (INDEPENDENT_AMBULATORY_CARE_PROVIDER_SITE_OTHER): Payer: Medicare Other | Admitting: Psychology

## 2018-08-28 NOTE — Progress Notes (Addendum)
Office: [phone]  /  Fax: [phone] TeleHealth Visit:  Vanessa Christensen has consented to this TeleHealth visit today via telephone call. The patient is located at home, the provider is located at the [employer] [facility] office. The participants in this visit include the listed provider and patient and any and all parties involved. The time spent was 22 minutes.   HPI:   Chief Complaint: OBESITY Vanessa Christensen is here to discuss her progress with her obesity treatment plan. She is on the Category 4 plan and is following her eating plan approximately 100 % of the time. She states she is doing cardio exercises and walking for 60 minutes 5 times per week. Vanessa Christensen is doing well and she states that she is sleeping and walking better. Vanessa Christensen is tracking all of her food. Vanessa Christensen liked the variety and flexibility. She has struggled with getting all of her protein. We were unable to weight the patient today for this TeleHealth visit.She feels as if she has lost weight since her last visit. She has lost 7 lbs since starting treatment with [name].  Vitamin D deficiency Vanessa Christensen has a diagnosis of vitamin D deficiency. Per the patient, she is not taking vit D. Her last vitamin D level was at 35.0. Vanessa Christensen denies nausea, vomiting or muscle weakness.  Insulin Resistance Vanessa Christensen has a diagnosis of insulin resistance based on her elevated fasting insulin level >5. Although Vanessa Christensen's blood glucose readings are still under good control, insulin resistance puts her at greater risk of metabolic syndrome and diabetes. Her last A1c was at 5.6 and last insulin level was at 13.2 She is not taking metformin currently and she continues to work on diet and exercise to decrease risk of diabetes. Vanessa Christensen denies polyphagia.  Depression with emotional eating behaviors Vanessa Christensen is struggling with emotional eating and using food for comfort to the extent that it is negatively impacting her health. She often snacks when she is not hungry.  Vanessa Christensen sometimes feels she is out of control and then feels guilty that she made poor food choices. She has been working on behavior modification techniques to help reduce her emotional eating and has been somewhat successful. Vanessa Christensen is currently taking Lexapro. She shows no sign of suicidal or homicidal ideations.  Depression screen [facility] 2/9 08/13/2018 05/08/2018 11/07/2017 05/09/2017 05/09/2017  Decreased Interest - 0 0 0 0  Down, Depressed, Hopeless 2 0 0 1 0  PHQ - 2 Score 2 0 0 1 0  Altered sleeping 3 0 0 2 0  Tired, decreased energy 3 0 0 2 0  Change in appetite 1 0 0 3 0  Feeling bad or failure about yourself  3 0 0 2 0  Trouble concentrating 0 0 0 0 0  Moving slowly or fidgety/restless 3 0 0 0 0  Suicidal thoughts 1 0 0 0 0  PHQ-9 Score 16 0 0 10 0  Difficult doing work/chores Somewhat difficult Not difficult at all Not difficult at all Not difficult at all Not difficult at all    ASSESSMENT AND PLAN:  Vitamin D deficiency  Insulin resistance  Other depression - with emotional eating  Class 3 severe obesity with serious comorbidity and body mass index (BMI) of 40.0 to 44.9 in adult, unspecified obesity type (HCC)  PLAN:  Vitamin D Deficiency [name] was informed that low vitamin D levels contributes to fatigue and are associated with obesity, breast, and colon cancer. She agrees to continue to take prescription Vit D @50 ,000 IU every week #  4 with no refills and will follow up for routine testing of vitamin D, at least 2-3 times per year. She was informed of the risk of over-replacement of vitamin D and agrees to not increase her dose unless she discusses this with [name] first. Maheen agrees to follow up with our clinic in 2 weeks.  Insulin Resistance [name] will continue to work on weight loss, exercise, and decreasing simple carbohydrates in her diet to help decrease the risk of diabetes. We dicussed insulin resistance in detail with myself and our registered dietician. She was  informed that eating too many simple carbohydrates or too many calories at one sitting increases the likelihood of GI side effects. [name] agreed to follow up with [name] as directed to monitor her progress.  Depression with Emotional Eating Behaviors We discussed behavior modification techniques today to help Vanessa Christensen deal with her emotional eating and depression. She will continue her medications as prescribed and follow up as directed.  Obesity Vanessa Christensen is currently in the action stage of change. As such, her goal is to continue with weight loss efforts She has agreed to follow the Category 4 plan [name] has been instructed to work up to a goal of 150 minutes of combined cardio and strengthening exercise per week for weight loss and overall health benefits. We discussed the following Behavioral Modification Strategies today: increase H2O intake, better snacking choices, increasing lean protein intake, decreasing simple carbohydrates, increasing vegetables, decreasing sodium intake and work on meal planning and easy cooking plans Vanessa Christensen will move her protein around throughout the day. We went over lunch options and dinner journaling today.  [name] has agreed to follow up with our clinic in 2 weeks. She was informed of the importance of frequent follow up visits to maximize her success with intensive lifestyle modifications for her multiple health conditions.  ALLERGIES: Allergies  Allergen Reactions  . Neulasta [Pegfilgrastim] Other (See Comments)    Raised lumps on legs, arms - had to be excised =  Happened every time pt received Neulasta.    MEDICATIONS: Current Outpatient Medications on File Prior to Visit  Medication Sig Dispense Refill  . Ascorbic Acid (VITAMIN C ER PO) Take 1 tablet by mouth daily.     Marland Kitchen aspirin 81 MG tablet Take 81 mg by mouth daily.    . AZOPT 1 % ophthalmic suspension PLACE 1 DROP INTO BOTH EYES TWICE DAILY  12  . b complex vitamins tablet Take 1 tablet by mouth daily.     Marland Kitchen escitalopram (LEXAPRO) 20 MG tablet TAKE 1 & 1/2 TABLETS BY MOUTH DAILY 135 tablet 3  . levothyroxine (SYNTHROID, LEVOTHROID) 75 MCG tablet TAKE 1 TABLET BY MOUTH EVERY DAY 90 tablet 1  . Multiple Vitamins-Minerals (MULTIVITAMIN ADULTS 50+ PO) Take 1 tablet by mouth daily.     . psyllium (METAMUCIL) 58.6 % powder Take 1 packet by mouth daily.     Marland Kitchen tolterodine (DETROL LA) 4 MG 24 hr capsule TAKE 1 CAPSULE BY MOUTH EVERY DAY 30 capsule 6   No current facility-administered medications on file prior to visit.     PAST MEDICAL HISTORY: Past Medical History:  Diagnosis Date  . Anxiety    pt denies  . Arthritis    hands and feet  . Cancer ([place]) 10/04/2006   breast  . Cataracts, both eyes   . Depression   . Depression   . Diverticulosis of colon   . Glaucoma   . Heart murmur   . Joint pain   .  Knee pain   . Obesity   . OSA on CPAP   . Osteoarthritis   . Sleep apnea   . Thyroid activity decreased     PAST SURGICAL HISTORY: Past Surgical History:  Procedure Laterality Date  . BREAST SURGERY Bilateral   . CATARACT EXTRACTION    . GLAUCOMA REPAIR    . MASTECTOMY, RADICAL Bilateral   . neulasta induced sterile abscesses    . THYROIDECTOMY, PARTIAL    . TOTAL KNEE ARTHROPLASTY Right     SOCIAL HISTORY: Social History   Tobacco Use  . Smoking status: Former Smoker    Last attempt to quit: 11/03/1978    Years since quitting: 39.8  . Smokeless tobacco: Never Used  Substance Use Topics  . Alcohol use: No  . Drug use: No    FAMILY HISTORY: Family History  Problem Relation Age of Onset  . CVA Mother   . Cancer Mother 83       breast cancer   . Heart disease Mother   . Stroke Mother   . Obesity Mother   . Leukemia Father   . High blood pressure Father   . High Cholesterol Father   . Heart disease Father   . Cancer Maternal Aunt 55       breast cancer   . Cancer Maternal Aunt 55       breast cancer  . Cancer Cousin 32       breast cancer    ROS: Review of  Systems  Gastrointestinal: Negative for nausea and vomiting.  Musculoskeletal:       Negative for muscle weakness  Endo/Heme/Allergies:       Negative for polyphagia  Psychiatric/Behavioral: Positive for depression. Negative for suicidal ideas.    PHYSICAL EXAM: Pt in no acute distress  RECENT LABS AND TESTS: BMET    Component Value Date/Time   NA 140 07/31/2018 1013   NA 141 07/13/2016 1015   K 4.5 07/31/2018 1013   K 4.3 07/13/2016 1015   CL 105 07/31/2018 1013   CO2 26 07/31/2018 1013   CO2 24 07/13/2016 1015   GLUCOSE 78 07/31/2018 1013   GLUCOSE 89 07/13/2016 1015   BUN 14 07/31/2018 1013   BUN 17.2 07/13/2016 1015   CREATININE 0.80 07/31/2018 1013   CREATININE 0.7 07/13/2016 1015   CALCIUM 9.5 07/31/2018 1013   CALCIUM 9.6 07/13/2016 1015   GFRNONAA >60 07/31/2018 1013   GFRAA >60 07/31/2018 1013   Lab Results  Component Value Date   HGBA1C 5.6 08/13/2018   Lab Results  Component Value Date   INSULIN 13.2 08/13/2018   CBC    Component Value Date/Time   WBC 4.6 07/31/2018 1013   RBC 4.38 07/31/2018 1013   HGB 12.1 07/31/2018 1013   HGB 12.2 07/13/2016 1015   HCT 39.0 07/31/2018 1013   HCT 36.9 07/13/2016 1015   PLT 190 07/31/2018 1013   PLT 183 07/13/2016 1015   MCV 89.0 07/31/2018 1013   MCV 84.3 07/13/2016 1015   MCH 27.6 07/31/2018 1013   MCHC 31.0 07/31/2018 1013   RDW 14.2 07/31/2018 1013   RDW 15.2 (H) 07/13/2016 1015   LYMPHSABS 1.3 07/31/2018 1013   LYMPHSABS 1.3 07/13/2016 1015   MONOABS 0.5 07/31/2018 1013   MONOABS 0.5 07/13/2016 1015   EOSABS 0.0 07/31/2018 1013   EOSABS 0.0 07/13/2016 1015   BASOSABS 0.0 07/31/2018 1013   BASOSABS 0.0 07/13/2016 1015   Iron/TIBC/Ferritin/ %Sat No results found for: IRON,  TIBC, FERRITIN, IRONPCTSAT Lipid Panel     Component Value Date/Time   CHOL 170 05/08/2018 0957   TRIG 114.0 05/08/2018 0957   HDL 43.40 05/08/2018 0957   CHOLHDL 4 05/08/2018 0957   VLDL 22.8 05/08/2018 0957   LDLCALC  103 (H) 05/08/2018 0957   Hepatic Function Panel     Component Value Date/Time   PROT 7.3 07/31/2018 1013   PROT 6.7 07/13/2016 1015   ALBUMIN 4.1 07/31/2018 1013   ALBUMIN 4.0 07/13/2016 1015   AST 24 07/31/2018 1013   AST 22 07/13/2016 1015   ALT 27 07/31/2018 1013   ALT 28 07/13/2016 1015   ALKPHOS 87 07/31/2018 1013   ALKPHOS 70 07/13/2016 1015   BILITOT 0.5 07/31/2018 1013   BILITOT 0.57 07/13/2016 1015   BILIDIR 0.1 05/08/2018 0957      Component Value Date/Time   TSH 1.05 05/08/2018 0957   TSH 1.32 11/07/2017 0921   TSH 0.86 05/09/2017 1006     Ref. Range 08/13/2018 14:19  Vitamin D, 25-Hydroxy Latest Ref Range: 30.0 - 100.0 ng/mL 35.0     I, Doreene Nest, am acting as Location manager for [employer]. [name], DO  I have reviewed the above documentation for accuracy and completeness, and I agree with the above. -Jearld Lesch, DO

## 2018-08-29 DIAGNOSIS — E8881 Metabolic syndrome: Secondary | ICD-10-CM | POA: Insufficient documentation

## 2018-08-29 DIAGNOSIS — E559 Vitamin D deficiency, unspecified: Secondary | ICD-10-CM | POA: Insufficient documentation

## 2018-08-29 DIAGNOSIS — E88819 Insulin resistance, unspecified: Secondary | ICD-10-CM | POA: Insufficient documentation

## 2018-09-11 ENCOUNTER — Ambulatory Visit (INDEPENDENT_AMBULATORY_CARE_PROVIDER_SITE_OTHER): Payer: Medicare Other | Admitting: Bariatrics

## 2018-09-16 ENCOUNTER — Ambulatory Visit (INDEPENDENT_AMBULATORY_CARE_PROVIDER_SITE_OTHER): Payer: Medicare Other | Admitting: Bariatrics

## 2018-09-16 ENCOUNTER — Encounter (INDEPENDENT_AMBULATORY_CARE_PROVIDER_SITE_OTHER): Payer: Self-pay | Admitting: Bariatrics

## 2018-09-16 ENCOUNTER — Other Ambulatory Visit: Payer: Self-pay

## 2018-09-16 DIAGNOSIS — Z6841 Body Mass Index (BMI) 40.0 and over, adult: Secondary | ICD-10-CM

## 2018-09-16 DIAGNOSIS — E8881 Metabolic syndrome: Secondary | ICD-10-CM

## 2018-09-16 DIAGNOSIS — E559 Vitamin D deficiency, unspecified: Secondary | ICD-10-CM

## 2018-09-16 MED ORDER — VITAMIN D (ERGOCALCIFEROL) 1.25 MG (50000 UNIT) PO CAPS: 50000.0000 [IU] | ORAL_CAPSULE | ORAL | 0 refills | Status: DC

## 2018-09-16 NOTE — Progress Notes (Signed)
Office: [phone]  /  Fax: [phone] TeleHealth Visit:  Vanessa Christensen has verbally consented to this TeleHealth visit today. The patient is located at home, the provider is located at the [employer] [facility] office. The participants in this visit include the listed provider and patient and any and all parties involved. The visit was conducted today via Skype.  HPI:   Chief Complaint: OBESITY Vanessa Christensen is here to discuss her progress with her obesity treatment plan. She is on the Category 4 plan and is following her eating plan approximately 95 % of the time. She states she is exercising 0 minutes 0 times per week. Vanessa Christensen states that she lost about 2 pounds. She has minimal stress and she is able to find the foods that she needs. We were unable to weigh the patient today for this TeleHealth visit. She feels as if she has lost weight since her last visit.   Vitamin D deficiency Vanessa Christensen has a diagnosis of vitamin D deficiency. Vanessa Christensen is taking prescription vit D and she denies nausea, vomiting or muscle weakness.  Insulin Resistance Vanessa Christensen has a diagnosis of insulin resistance based on her elevated fasting insulin level >5. Although Vanessa Christensen's blood glucose readings are still under good control, insulin resistance puts her at greater risk of metabolic syndrome and diabetes. Her last A1c was at 5.6 and last insulin level was at 13.2 She is not taking medications. Vanessa Christensen continues to work on diet and exercise to decrease risk of diabetes. She denies polyphagia.  ASSESSMENT AND PLAN:  Vitamin D deficiency - Plan: Vitamin D, Ergocalciferol, (DRISDOL) 1.25 MG (50000 UT) CAPS capsule  Insulin resistance  Class 3 severe obesity with serious comorbidity and body mass index (BMI) of 40.0 to 44.9 in adult, unspecified obesity type (Vanessa Christensen)  PLAN:  Vitamin D Deficiency Vanessa Christensen was informed that low vitamin D levels contributes to fatigue and are associated with obesity, breast, and colon cancer.  She agrees to take prescription Vit D @50 ,000 IU every 2 weeks #2 with no refills and will follow up for routine testing of vitamin D, at least 2-3 times per year. She was informed of the risk of over-replacement of vitamin D and agrees to not increase her dose unless she discusses this with [name] first. Vanessa Christensen agrees to follow up as directed.  Insulin Resistance Vanessa Christensen will continue to work on weight loss, exercise, increasing lean protein and decreasing simple carbohydrates in her diet to help decrease the risk of diabetes. She was informed that eating too many simple carbohydrates or too many calories at one sitting increases the likelihood of GI side effects. Cherril agreed to follow up with [name] as directed to monitor her progress.  Obesity Vanessa Christensen is currently in the action stage of change. As such, her goal is to continue with weight loss efforts She has agreed to follow the Category 4 plan Vanessa Christensen has been instructed to work up to a goal of 150 minutes of combined cardio and strengthening exercise per week for weight loss and overall health benefits. We discussed the following Behavioral Modification Strategies today: increase H2O intake, no skipping meals, keeping healthy foods in the home, increasing lean protein intake, decreasing simple carbohydrates, increasing vegetables, decrease eating out, work on meal planning and easy cooking plans, dealing with family or coworker sabotage, travel eating strategies, celebration eating strategies and holiday eating strategies  Vanessa Christensen will weigh herself at home before visits. She can spread the protein throughout the day. Vanessa Christensen will track her snacks.  Vanessa Christensen  has agreed to follow up with our clinic in 2 weeks. She was informed of the importance of frequent follow up visits to maximize her success with intensive lifestyle modifications for her multiple health conditions.  ALLERGIES: Allergies  Allergen Reactions   Neulasta [Pegfilgrastim] Other (See  Comments)    Raised lumps on legs, arms - had to be excised =  Happened every time pt received Neulasta.    MEDICATIONS: Current Outpatient Medications on File Prior to Visit  Medication Sig Dispense Refill   Ascorbic Acid (VITAMIN C ER PO) Take 1 tablet by mouth daily.      aspirin 81 MG tablet Take 81 mg by mouth daily.     AZOPT 1 % ophthalmic suspension PLACE 1 DROP INTO BOTH EYES TWICE DAILY  12   b complex vitamins tablet Take 1 tablet by mouth daily.     escitalopram (LEXAPRO) 20 MG tablet TAKE 1 & 1/2 TABLETS BY MOUTH DAILY 135 tablet 3   levothyroxine (SYNTHROID, LEVOTHROID) 75 MCG tablet TAKE 1 TABLET BY MOUTH EVERY DAY 90 tablet 1   Multiple Vitamins-Minerals (MULTIVITAMIN ADULTS 50+ PO) Take 1 tablet by mouth daily.      psyllium (METAMUCIL) 58.6 % powder Take 1 packet by mouth daily.      tolterodine (DETROL LA) 4 MG 24 hr capsule TAKE 1 CAPSULE BY MOUTH EVERY DAY 30 capsule 6   No current facility-administered medications on file prior to visit.     PAST MEDICAL HISTORY: Past Medical History:  Diagnosis Date   Anxiety    pt denies   Arthritis    hands and feet   Cancer ([place]) 10/04/2006   breast   Cataracts, both eyes    Depression    Depression    Diverticulosis of colon    Glaucoma    Heart murmur    Joint pain    Knee pain    Obesity    OSA on CPAP    Osteoarthritis    Sleep apnea    Thyroid activity decreased     PAST SURGICAL HISTORY: Past Surgical History:  Procedure Laterality Date   BREAST SURGERY Bilateral    CATARACT EXTRACTION     GLAUCOMA REPAIR     MASTECTOMY, RADICAL Bilateral    neulasta induced sterile abscesses     THYROIDECTOMY, PARTIAL     TOTAL KNEE ARTHROPLASTY Right     SOCIAL HISTORY: Social History   Tobacco Use   Smoking status: Former Smoker    Last attempt to quit: 11/03/1978    Years since quitting: 39.8   Smokeless tobacco: Never Used  Substance Use Topics   Alcohol use: No    Drug use: No    FAMILY HISTORY: Family History  Problem Relation Age of Onset   CVA Mother    Cancer Mother 28       breast cancer    Heart disease Mother    Stroke Mother    Obesity Mother    Leukemia Father    High blood pressure Father    High Cholesterol Father    Heart disease Father    Cancer Maternal Aunt 74       breast cancer    Cancer Maternal Aunt 55       breast cancer   Cancer Cousin 32       breast cancer    ROS: Review of Systems  Constitutional: Positive for weight loss.  Gastrointestinal: Negative for nausea and vomiting.  Musculoskeletal:  Negative for muscle weakness  Endo/Heme/Allergies:       Negative for polyphagia    PHYSICAL EXAM: Pt in no acute distress  RECENT LABS AND TESTS: BMET    Component Value Date/Time   NA 140 07/31/2018 1013   NA 141 07/13/2016 1015   K 4.5 07/31/2018 1013   K 4.3 07/13/2016 1015   CL 105 07/31/2018 1013   CO2 26 07/31/2018 1013   CO2 24 07/13/2016 1015   GLUCOSE 78 07/31/2018 1013   GLUCOSE 89 07/13/2016 1015   BUN 14 07/31/2018 1013   BUN 17.2 07/13/2016 1015   CREATININE 0.80 07/31/2018 1013   CREATININE 0.7 07/13/2016 1015   CALCIUM 9.5 07/31/2018 1013   CALCIUM 9.6 07/13/2016 1015   GFRNONAA >60 07/31/2018 1013   GFRAA >60 07/31/2018 1013   Lab Results  Component Value Date   HGBA1C 5.6 08/13/2018   Lab Results  Component Value Date   INSULIN 13.2 08/13/2018   CBC    Component Value Date/Time   WBC 4.6 07/31/2018 1013   RBC 4.38 07/31/2018 1013   HGB 12.1 07/31/2018 1013   HGB 12.2 07/13/2016 1015   HCT 39.0 07/31/2018 1013   HCT 36.9 07/13/2016 1015   PLT 190 07/31/2018 1013   PLT 183 07/13/2016 1015   MCV 89.0 07/31/2018 1013   MCV 84.3 07/13/2016 1015   MCH 27.6 07/31/2018 1013   MCHC 31.0 07/31/2018 1013   RDW 14.2 07/31/2018 1013   RDW 15.2 (H) 07/13/2016 1015   LYMPHSABS 1.3 07/31/2018 1013   LYMPHSABS 1.3 07/13/2016 1015   MONOABS 0.5 07/31/2018 1013     MONOABS 0.5 07/13/2016 1015   EOSABS 0.0 07/31/2018 1013   EOSABS 0.0 07/13/2016 1015   BASOSABS 0.0 07/31/2018 1013   BASOSABS 0.0 07/13/2016 1015   Iron/TIBC/Ferritin/ %Sat No results found for: IRON, TIBC, FERRITIN, IRONPCTSAT Lipid Panel     Component Value Date/Time   CHOL 170 05/08/2018 0957   TRIG 114.0 05/08/2018 0957   HDL 43.40 05/08/2018 0957   CHOLHDL 4 05/08/2018 0957   VLDL 22.8 05/08/2018 0957   LDLCALC 103 (H) 05/08/2018 0957   Hepatic Function Panel     Component Value Date/Time   PROT 7.3 07/31/2018 1013   PROT 6.7 07/13/2016 1015   ALBUMIN 4.1 07/31/2018 1013   ALBUMIN 4.0 07/13/2016 1015   AST 24 07/31/2018 1013   AST 22 07/13/2016 1015   ALT 27 07/31/2018 1013   ALT 28 07/13/2016 1015   ALKPHOS 87 07/31/2018 1013   ALKPHOS 70 07/13/2016 1015   BILITOT 0.5 07/31/2018 1013   BILITOT 0.57 07/13/2016 1015   BILIDIR 0.1 05/08/2018 0957      Component Value Date/Time   TSH 1.05 05/08/2018 0957   TSH 1.32 11/07/2017 0921   TSH 0.86 05/09/2017 1006    Results for LYDIE, STAMMEN (MRN [id number]) as of 09/16/2018 15:46  Ref. Range 08/13/2018 14:19  Vitamin D, 25-Hydroxy Latest Ref Range: 30.0 - 100.0 ng/mL 35.0    I, Doreene Nest, am acting as Location manager for [employer]. [name], DO  I have reviewed the above documentation for accuracy and completeness, and I agree with the above. -Jearld Lesch, DO

## 2018-09-30 ENCOUNTER — Encounter (INDEPENDENT_AMBULATORY_CARE_PROVIDER_SITE_OTHER): Payer: Self-pay | Admitting: Bariatrics

## 2018-09-30 ENCOUNTER — Other Ambulatory Visit: Payer: Self-pay

## 2018-09-30 ENCOUNTER — Ambulatory Visit (INDEPENDENT_AMBULATORY_CARE_PROVIDER_SITE_OTHER): Payer: Medicare Other | Admitting: Bariatrics

## 2018-09-30 DIAGNOSIS — E8881 Metabolic syndrome: Secondary | ICD-10-CM

## 2018-09-30 DIAGNOSIS — Z6841 Body Mass Index (BMI) 40.0 and over, adult: Secondary | ICD-10-CM

## 2018-09-30 DIAGNOSIS — F3289 Other specified depressive episodes: Secondary | ICD-10-CM

## 2018-09-30 NOTE — Progress Notes (Signed)
Office: [phone]  /  Fax: [phone] TeleHealth Visit:  Vanessa Christensen has verbally consented to this TeleHealth visit today. The patient is located at home, the provider is located at the [employer] [facility] office. The participants in this visit include the listed provider and patient and any and all parties involved. The visit was conducted today via Skype.  HPI:   Chief Complaint: OBESITY Vanessa Christensen is here to discuss her progress with her obesity treatment plan. She is on the Category 4 plan and is following her eating plan approximately 95 % of the time. She states she is exercising 0 minutes 0 times per week. Vanessa Christensen states that she has lost 5 pounds (weight 226 lbs). She stuck with the plan. Vanessa Christensen had one indiscretion of stress eating. Her appetite is normal. We were unable to weigh the patient today for this TeleHealth visit. She feels as if she has lost weight since her last visit. She has lost 15 lbs since starting treatment with [name].  Insulin Resistance Vanessa Christensen has a diagnosis of insulin resistance based on her elevated fasting insulin level >5. Although Vanessa Christensen's blood glucose readings are still under good control, insulin resistance puts her at greater risk of metabolic syndrome and diabetes. Her last A1c was at 5.6 and last insulin level was at 13.2 She is not taking medications currently and continues to work on diet and exercise to decrease risk of diabetes. She denies polyphagia.  Depression with emotional eating behaviors Vanessa Christensen is taking Lexapro. She struggles with emotional eating and using food for comfort to the extent that it is negatively impacting her health. She often snacks when she is not hungry. Vanessa Christensen sometimes feels she is out of control and then feels guilty that she made poor food choices. She has been working on behavior modification techniques to help reduce her emotional eating and has been somewhat successful. She shows no sign of suicidal or homicidal  ideations.  Depression screen [facility] 2/9 08/13/2018 05/08/2018 11/07/2017 05/09/2017 05/09/2017  Decreased Interest - 0 0 0 0  Down, Depressed, Hopeless 2 0 0 1 0  PHQ - 2 Score 2 0 0 1 0  Altered sleeping 3 0 0 2 0  Tired, decreased energy 3 0 0 2 0  Change in appetite 1 0 0 3 0  Feeling bad or failure about yourself  3 0 0 2 0  Trouble concentrating 0 0 0 0 0  Moving slowly or fidgety/restless 3 0 0 0 0  Suicidal thoughts 1 0 0 0 0  PHQ-9 Score 16 0 0 10 0  Difficult doing work/chores Somewhat difficult Not difficult at all Not difficult at all Not difficult at all Not difficult at all    ASSESSMENT AND PLAN:  Insulin resistance  Other depression - with emotional eating  Class 3 severe obesity with serious comorbidity and body mass index (BMI) of 40.0 to 44.9 in adult, unspecified obesity type (HCC)  PLAN:  Insulin Resistance Vanessa Christensen will continue to work on weight loss, exercise, increasing lean protein and decreasing simple carbohydrates in her diet to help decrease the risk of diabetes. She was informed that eating too many simple carbohydrates or too many calories at one sitting increases the likelihood of GI side effects. She will consider beginning exercise. Kaydon agreed to follow up with [name] as directed to monitor her progress.  Depression with Emotional Eating Behaviors We discussed behavior modification techniques today to help Vanessa Christensen deal with her emotional eating and depression. She will continue  medications as prescribed and follow up as directed.  Obesity Vanessa Christensen is currently in the action stage of change. As such, her goal is to continue with weight loss efforts She has agreed to follow the Category 4 plan Vanessa Christensen will do some yard work, and walking and she will increase for weight loss and overall health benefits. We discussed the following Behavioral Modification Strategies today: increase H2O intake, no skipping meals, keeping healthy foods in the home, increasing lean  protein intake, decreasing simple carbohydrates, increasing vegetables, decrease eating out and work on meal planning and intentional eating. Vanessa Christensen will weigh herself at home before each visit. She will increase raw vegetables. Ideas for breakfast were given to patient today.  Vanessa Christensen has agreed to follow up with our clinic in 2 weeks. She was informed of the importance of frequent follow up visits to maximize her success with intensive lifestyle modifications for her multiple health conditions.  ALLERGIES: Allergies  Allergen Reactions  . Neulasta [Pegfilgrastim] Other (See Comments)    Raised lumps on legs, arms - had to be excised =  Happened every time pt received Neulasta.    MEDICATIONS: Current Outpatient Medications on File Prior to Visit  Medication Sig Dispense Refill  . Ascorbic Acid (VITAMIN C ER PO) Take 1 tablet by mouth daily.     Marland Kitchen aspirin 81 MG tablet Take 81 mg by mouth daily.    . AZOPT 1 % ophthalmic suspension PLACE 1 DROP INTO BOTH EYES TWICE DAILY  12  . b complex vitamins tablet Take 1 tablet by mouth daily.    Marland Kitchen escitalopram (LEXAPRO) 20 MG tablet TAKE 1 & 1/2 TABLETS BY MOUTH DAILY 135 tablet 3  . levothyroxine (SYNTHROID, LEVOTHROID) 75 MCG tablet TAKE 1 TABLET BY MOUTH EVERY DAY 90 tablet 1  . Multiple Vitamins-Minerals (MULTIVITAMIN ADULTS 50+ PO) Take 1 tablet by mouth daily.     . psyllium (METAMUCIL) 58.6 % powder Take 1 packet by mouth daily.     Marland Kitchen tolterodine (DETROL LA) 4 MG 24 hr capsule TAKE 1 CAPSULE BY MOUTH EVERY DAY 30 capsule 6  . Vitamin D, Ergocalciferol, (DRISDOL) 1.25 MG (50000 UT) CAPS capsule Take 1 capsule (50,000 Units total) by mouth every 7 (seven) days. 4 capsule 0   No current facility-administered medications on file prior to visit.     PAST MEDICAL HISTORY: Past Medical History:  Diagnosis Date  . Anxiety    pt denies  . Arthritis    hands and feet  . Cancer ([place]) 10/04/2006   breast  . Cataracts, both eyes   . Depression    . Depression   . Diverticulosis of colon   . Glaucoma   . Heart murmur   . Joint pain   . Knee pain   . Obesity   . OSA on CPAP   . Osteoarthritis   . Sleep apnea   . Thyroid activity decreased     PAST SURGICAL HISTORY: Past Surgical History:  Procedure Laterality Date  . BREAST SURGERY Bilateral   . CATARACT EXTRACTION    . GLAUCOMA REPAIR    . MASTECTOMY, RADICAL Bilateral   . neulasta induced sterile abscesses    . THYROIDECTOMY, PARTIAL    . TOTAL KNEE ARTHROPLASTY Right     SOCIAL HISTORY: Social History   Tobacco Use  . Smoking status: Former Smoker    Last attempt to quit: 11/03/1978    Years since quitting: 39.9  . Smokeless tobacco: Never Used  Substance Use  Topics  . Alcohol use: No  . Drug use: No    FAMILY HISTORY: Family History  Problem Relation Age of Onset  . CVA Mother   . Cancer Mother 23       breast cancer   . Heart disease Mother   . Stroke Mother   . Obesity Mother   . Leukemia Father   . High blood pressure Father   . High Cholesterol Father   . Heart disease Father   . Cancer Maternal Aunt 55       breast cancer   . Cancer Maternal Aunt 55       breast cancer  . Cancer Cousin 32       breast cancer    ROS: Review of Systems  Constitutional: Positive for weight loss.  Endo/Heme/Allergies:       Negative for polyphagia  Psychiatric/Behavioral: Positive for depression. Negative for suicidal ideas.    PHYSICAL EXAM: Pt in no acute distress  RECENT LABS AND TESTS: BMET    Component Value Date/Time   NA 140 07/31/2018 1013   NA 141 07/13/2016 1015   K 4.5 07/31/2018 1013   K 4.3 07/13/2016 1015   CL 105 07/31/2018 1013   CO2 26 07/31/2018 1013   CO2 24 07/13/2016 1015   GLUCOSE 78 07/31/2018 1013   GLUCOSE 89 07/13/2016 1015   BUN 14 07/31/2018 1013   BUN 17.2 07/13/2016 1015   CREATININE 0.80 07/31/2018 1013   CREATININE 0.7 07/13/2016 1015   CALCIUM 9.5 07/31/2018 1013   CALCIUM 9.6 07/13/2016 1015    GFRNONAA >60 07/31/2018 1013   GFRAA >60 07/31/2018 1013   Lab Results  Component Value Date   HGBA1C 5.6 08/13/2018   Lab Results  Component Value Date   INSULIN 13.2 08/13/2018   CBC    Component Value Date/Time   WBC 4.6 07/31/2018 1013   RBC 4.38 07/31/2018 1013   HGB 12.1 07/31/2018 1013   HGB 12.2 07/13/2016 1015   HCT 39.0 07/31/2018 1013   HCT 36.9 07/13/2016 1015   PLT 190 07/31/2018 1013   PLT 183 07/13/2016 1015   MCV 89.0 07/31/2018 1013   MCV 84.3 07/13/2016 1015   MCH 27.6 07/31/2018 1013   MCHC 31.0 07/31/2018 1013   RDW 14.2 07/31/2018 1013   RDW 15.2 (H) 07/13/2016 1015   LYMPHSABS 1.3 07/31/2018 1013   LYMPHSABS 1.3 07/13/2016 1015   MONOABS 0.5 07/31/2018 1013   MONOABS 0.5 07/13/2016 1015   EOSABS 0.0 07/31/2018 1013   EOSABS 0.0 07/13/2016 1015   BASOSABS 0.0 07/31/2018 1013   BASOSABS 0.0 07/13/2016 1015   Iron/TIBC/Ferritin/ %Sat No results found for: IRON, TIBC, FERRITIN, IRONPCTSAT Lipid Panel     Component Value Date/Time   CHOL 170 05/08/2018 0957   TRIG 114.0 05/08/2018 0957   HDL 43.40 05/08/2018 0957   CHOLHDL 4 05/08/2018 0957   VLDL 22.8 05/08/2018 0957   LDLCALC 103 (H) 05/08/2018 0957   Hepatic Function Panel     Component Value Date/Time   PROT 7.3 07/31/2018 1013   PROT 6.7 07/13/2016 1015   ALBUMIN 4.1 07/31/2018 1013   ALBUMIN 4.0 07/13/2016 1015   AST 24 07/31/2018 1013   AST 22 07/13/2016 1015   ALT 27 07/31/2018 1013   ALT 28 07/13/2016 1015   ALKPHOS 87 07/31/2018 1013   ALKPHOS 70 07/13/2016 1015   BILITOT 0.5 07/31/2018 1013   BILITOT 0.57 07/13/2016 1015   BILIDIR 0.1 05/08/2018 0957  Component Value Date/Time   TSH 1.05 05/08/2018 0957   TSH 1.32 11/07/2017 0921   TSH 0.86 05/09/2017 1006    Results for TYNEKA, SCAFIDI (MRN [id number]) as of 09/30/2018 15:56  Ref. Range 08/13/2018 14:19  Vitamin D, 25-Hydroxy Latest Ref Range: 30.0 - 100.0 ng/mL 35.0    I, Doreene Nest, am acting as  Location manager for [employer]. [name], DO  I have reviewed the above documentation for accuracy and completeness, and I agree with the above. -Jearld Lesch, DO

## 2018-10-02 ENCOUNTER — Encounter (INDEPENDENT_AMBULATORY_CARE_PROVIDER_SITE_OTHER): Payer: Self-pay

## 2018-10-02 ENCOUNTER — Other Ambulatory Visit: Payer: Medicare Other

## 2018-10-14 ENCOUNTER — Ambulatory Visit (INDEPENDENT_AMBULATORY_CARE_PROVIDER_SITE_OTHER): Payer: Medicare Other | Admitting: Bariatrics

## 2018-10-14 ENCOUNTER — Encounter (INDEPENDENT_AMBULATORY_CARE_PROVIDER_SITE_OTHER): Payer: Self-pay | Admitting: Bariatrics

## 2018-10-14 ENCOUNTER — Other Ambulatory Visit: Payer: Self-pay

## 2018-10-14 DIAGNOSIS — Z6841 Body Mass Index (BMI) 40.0 and over, adult: Secondary | ICD-10-CM | POA: Diagnosis not present

## 2018-10-14 DIAGNOSIS — E8881 Metabolic syndrome: Secondary | ICD-10-CM

## 2018-10-14 DIAGNOSIS — G4733 Obstructive sleep apnea (adult) (pediatric): Secondary | ICD-10-CM

## 2018-10-15 NOTE — Progress Notes (Signed)
Office: [phone]  /  Fax: [phone] TeleHealth Visit:  Vanessa Christensen has verbally consented to this TeleHealth visit today. The patient is located at home, the provider is located at the [employer] [facility] office. The participants in this visit include the listed provider and patient and any and all parties involved. The visit was conducted today via Skype.  HPI:   Chief Complaint: OBESITY Vanessa Christensen is here to discuss her progress with her obesity treatment plan. She is on the Category 4 plan and is following her eating plan approximately 95 % of the time. She states she is exercising 0 minutes 0 times per week. Vanessa Christensen states that she has gained weight (weight 226 lbs). She did not make good choices over the weekend. She is drinking more water. We were unable to weigh the patient today for this TeleHealth visit. She feels as if she has gained weight since her last visit. She has lost 15 lbs since starting treatment with [name].  Insulin Resistance Vanessa Christensen has a diagnosis of insulin resistance based on her elevated fasting insulin level >5. Although Vanessa Christensen's blood glucose readings are still under good control, insulin resistance puts her at greater risk of metabolic syndrome and diabetes. Her last A1c was at 5.6 and last insulin level was at 13.2 Vanessa Christensen is not taking medications and she continues to work on diet and exercise to decrease risk of diabetes.  OSA (obstructive sleep apnea) Vanessa Christensen has a diagnosis of obstructive sleep apnea. She is using CPAP. Vanessa Christensen is getting "restful sleep".  ASSESSMENT AND PLAN:  Insulin resistance  OSA (obstructive sleep apnea)  Class 3 severe obesity with serious comorbidity and body mass index (BMI) of 40.0 to 44.9 in adult, unspecified obesity type ([place])  PLAN:  Insulin Resistance Vanessa Christensen will continue to work on weight loss, increasing lean protein and decreasing simple carbohydrates in her diet to help decrease the risk of diabetes. Vanessa Christensen  will consider exercise. She was informed that eating too many simple carbohydrates or too many calories at one sitting increases the likelihood of GI side effects. Vanessa Christensen agreed to follow up with [name] as directed to monitor her progress.  OSA (obstructive sleep apnea) Vanessa Christensen will continue to wear CPAP nightly and she will consider getting a new CPAP. Vanessa Christensen will follow up with our clinic in 2 weeks.  Obesity Vanessa Christensen is currently in the action stage of change. As such, her goal is to continue with weight loss efforts She has agreed to follow the Category 4 plan Vanessa Christensen has been instructed to work up to a goal of 150 minutes of combined cardio and strengthening exercise per week for weight loss and overall health benefits. We discussed the following Behavioral Modification Strategies today: increase H2O intake, no skipping meals, keeping healthy foods in the home, better snacking choices, increasing lean protein intake, decreasing simple carbohydrates, increasing vegetables, decrease eating out, work on meal planning and easy cooking plans and emotional eating strategies Vanessa Christensen will weigh herself at home before each visit. She will eat her meals at home.  Vanessa Christensen has agreed to follow up with our clinic in 2 weeks. She was informed of the importance of frequent follow up visits to maximize her success with intensive lifestyle modifications for her multiple health conditions.  ALLERGIES: Allergies  Allergen Reactions  . Neulasta [Pegfilgrastim] Other (See Comments)    Raised lumps on legs, arms - had to be excised =  Happened every time pt received Neulasta.    MEDICATIONS: Current Outpatient Medications on  File Prior to Visit  Medication Sig Dispense Refill  . Ascorbic Acid (VITAMIN C ER PO) Take 1 tablet by mouth daily.     Marland Kitchen aspirin 81 MG tablet Take 81 mg by mouth daily.    . AZOPT 1 % ophthalmic suspension PLACE 1 DROP INTO BOTH EYES TWICE DAILY  12  . b complex vitamins tablet Take 1 tablet  by mouth daily.    Marland Kitchen escitalopram (LEXAPRO) 20 MG tablet TAKE 1 & 1/2 TABLETS BY MOUTH DAILY 135 tablet 3  . levothyroxine (SYNTHROID, LEVOTHROID) 75 MCG tablet TAKE 1 TABLET BY MOUTH EVERY DAY 90 tablet 1  . Multiple Vitamins-Minerals (MULTIVITAMIN ADULTS 50+ PO) Take 1 tablet by mouth daily.     . psyllium (METAMUCIL) 58.6 % powder Take 1 packet by mouth daily.     Marland Kitchen tolterodine (DETROL LA) 4 MG 24 hr capsule TAKE 1 CAPSULE BY MOUTH EVERY DAY 30 capsule 6  . Vitamin D, Ergocalciferol, (DRISDOL) 1.25 MG (50000 UT) CAPS capsule Take 1 capsule (50,000 Units total) by mouth every 7 (seven) days. 4 capsule 0   No current facility-administered medications on file prior to visit.     PAST MEDICAL HISTORY: Past Medical History:  Diagnosis Date  . Anxiety    pt denies  . Arthritis    hands and feet  . Cancer ([place]) 10/04/2006   breast  . Cataracts, both eyes   . Depression   . Depression   . Diverticulosis of colon   . Glaucoma   . Heart murmur   . Joint pain   . Knee pain   . Obesity   . OSA on CPAP   . Osteoarthritis   . Sleep apnea   . Thyroid activity decreased     PAST SURGICAL HISTORY: Past Surgical History:  Procedure Laterality Date  . BREAST SURGERY Bilateral   . CATARACT EXTRACTION    . GLAUCOMA REPAIR    . MASTECTOMY, RADICAL Bilateral   . neulasta induced sterile abscesses    . THYROIDECTOMY, PARTIAL    . TOTAL KNEE ARTHROPLASTY Right     SOCIAL HISTORY: Social History   Tobacco Use  . Smoking status: Former Smoker    Last attempt to quit: 11/03/1978    Years since quitting: 39.9  . Smokeless tobacco: Never Used  Substance Use Topics  . Alcohol use: No  . Drug use: No    FAMILY HISTORY: Family History  Problem Relation Age of Onset  . CVA Mother   . Cancer Mother 52       breast cancer   . Heart disease Mother   . Stroke Mother   . Obesity Mother   . Leukemia Father   . High blood pressure Father   . High Cholesterol Father   . Heart disease  Father   . Cancer Maternal Aunt 55       breast cancer   . Cancer Maternal Aunt 55       breast cancer  . Cancer Cousin 32       breast cancer    ROS: Review of Systems  Constitutional: Negative for weight loss.  Psychiatric/Behavioral: The patient does not have insomnia.     PHYSICAL EXAM: Pt in no acute distress  RECENT LABS AND TESTS: BMET    Component Value Date/Time   NA 140 07/31/2018 1013   NA 141 07/13/2016 1015   K 4.5 07/31/2018 1013   K 4.3 07/13/2016 1015   CL 105 07/31/2018 1013  CO2 26 07/31/2018 1013   CO2 24 07/13/2016 1015   GLUCOSE 78 07/31/2018 1013   GLUCOSE 89 07/13/2016 1015   BUN 14 07/31/2018 1013   BUN 17.2 07/13/2016 1015   CREATININE 0.80 07/31/2018 1013   CREATININE 0.7 07/13/2016 1015   CALCIUM 9.5 07/31/2018 1013   CALCIUM 9.6 07/13/2016 1015   GFRNONAA >60 07/31/2018 1013   GFRAA >60 07/31/2018 1013   Lab Results  Component Value Date   HGBA1C 5.6 08/13/2018   Lab Results  Component Value Date   INSULIN 13.2 08/13/2018   CBC    Component Value Date/Time   WBC 4.6 07/31/2018 1013   RBC 4.38 07/31/2018 1013   HGB 12.1 07/31/2018 1013   HGB 12.2 07/13/2016 1015   HCT 39.0 07/31/2018 1013   HCT 36.9 07/13/2016 1015   PLT 190 07/31/2018 1013   PLT 183 07/13/2016 1015   MCV 89.0 07/31/2018 1013   MCV 84.3 07/13/2016 1015   MCH 27.6 07/31/2018 1013   MCHC 31.0 07/31/2018 1013   RDW 14.2 07/31/2018 1013   RDW 15.2 (H) 07/13/2016 1015   LYMPHSABS 1.3 07/31/2018 1013   LYMPHSABS 1.3 07/13/2016 1015   MONOABS 0.5 07/31/2018 1013   MONOABS 0.5 07/13/2016 1015   EOSABS 0.0 07/31/2018 1013   EOSABS 0.0 07/13/2016 1015   BASOSABS 0.0 07/31/2018 1013   BASOSABS 0.0 07/13/2016 1015   Iron/TIBC/Ferritin/ %Sat No results found for: IRON, TIBC, FERRITIN, IRONPCTSAT Lipid Panel     Component Value Date/Time   CHOL 170 05/08/2018 0957   TRIG 114.0 05/08/2018 0957   HDL 43.40 05/08/2018 0957   CHOLHDL 4 05/08/2018 0957   VLDL  22.8 05/08/2018 0957   LDLCALC 103 (H) 05/08/2018 0957   Hepatic Function Panel     Component Value Date/Time   PROT 7.3 07/31/2018 1013   PROT 6.7 07/13/2016 1015   ALBUMIN 4.1 07/31/2018 1013   ALBUMIN 4.0 07/13/2016 1015   AST 24 07/31/2018 1013   AST 22 07/13/2016 1015   ALT 27 07/31/2018 1013   ALT 28 07/13/2016 1015   ALKPHOS 87 07/31/2018 1013   ALKPHOS 70 07/13/2016 1015   BILITOT 0.5 07/31/2018 1013   BILITOT 0.57 07/13/2016 1015   BILIDIR 0.1 05/08/2018 0957      Component Value Date/Time   TSH 1.05 05/08/2018 0957   TSH 1.32 11/07/2017 0921   TSH 0.86 05/09/2017 1006     Ref. Range 08/13/2018 14:19  Vitamin D, 25-Hydroxy Latest Ref Range: 30.0 - 100.0 ng/mL 35.0    I, Doreene Nest, am acting as Location manager for [employer]. [name], DO  I have reviewed the above documentation for accuracy and completeness, and I agree with the above. -Jearld Lesch, DO

## 2018-10-29 ENCOUNTER — Encounter (INDEPENDENT_AMBULATORY_CARE_PROVIDER_SITE_OTHER): Payer: Self-pay | Admitting: Bariatrics

## 2018-10-29 ENCOUNTER — Ambulatory Visit (INDEPENDENT_AMBULATORY_CARE_PROVIDER_SITE_OTHER): Payer: Medicare Other | Admitting: Bariatrics

## 2018-10-29 ENCOUNTER — Other Ambulatory Visit: Payer: Self-pay

## 2018-10-29 DIAGNOSIS — E559 Vitamin D deficiency, unspecified: Secondary | ICD-10-CM

## 2018-10-29 DIAGNOSIS — Z6841 Body Mass Index (BMI) 40.0 and over, adult: Secondary | ICD-10-CM | POA: Diagnosis not present

## 2018-10-29 DIAGNOSIS — G4733 Obstructive sleep apnea (adult) (pediatric): Secondary | ICD-10-CM | POA: Diagnosis not present

## 2018-10-29 DIAGNOSIS — E8881 Metabolic syndrome: Secondary | ICD-10-CM

## 2018-10-29 NOTE — Progress Notes (Signed)
Office: [phone]  /  Fax: [phone] TeleHealth Visit:  Vanessa Christensen has verbally consented to this TeleHealth visit today. The patient is located at home, the provider is located at the [facility] office. The participants in this visit include the listed provider and patient and any and all parties involved. The visit was conducted today via Skype.  HPI:   Chief Complaint: OBESITY Vanessa Christensen is here to discuss her progress with her obesity treatment plan. She is on the Category 4 plan and is following her eating plan approximately 95 % of the time. She states she is riding the recumbent bike for 30 minutes 3 times per week. Vanessa Christensen states that she has lost 2 pounds (weight 220 lbs). She is struggling with getting in her protein. We were unable to weigh the patient today for this TeleHealth visit. She feels as if she has lost weight since her last visit. She has lost 21 lbs since starting treatment with [name].  Insulin Resistance Vanessa Christensen has a diagnosis of insulin resistance based on her elevated fasting insulin level >5. Although Vanessa Christensen's blood glucose readings are still under good control, insulin resistance puts her at greater risk of metabolic syndrome and diabetes. Last A1c was at 5.6 and last insulin level was at 13.2 Vanessa Christensen is not taking medications currently and she continues to work on diet and exercise to decrease risk of diabetes.  OSA (obstructive sleep apnea) Vanessa Christensen has a diagnosis of obstructive sleep apnea. She wears CPAP. Vanessa Christensen has reasonable restful sleep.  Vitamin D deficiency Vanessa Christensen has a diagnosis of vitamin D deficiency. She is currently taking vit D and denies nausea, vomiting or muscle weakness.  ASSESSMENT AND PLAN:  Insulin resistance - Plan: Hemoglobin A1c, Insulin, random, Comprehensive metabolic panel  OSA (obstructive sleep apnea)  Vitamin D deficiency - Plan: VITAMIN D 25 Hydroxy (Vit-D Deficiency, Fractures)  Class 3 severe obesity with  serious comorbidity and body mass index (BMI) of 40.0 to 44.9 in adult, unspecified obesity type (Vanessa Christensen)  PLAN:  Insulin Resistance Vanessa Christensen will continue to work on weight loss, exercise, increasing lean protein and decreasing simple carbohydrates in her diet to help decrease the risk of diabetes. She was informed that eating too many simple carbohydrates or too many calories at one sitting increases the likelihood of GI side effects. Vanessa Christensen agreed to follow up with [name] as directed to monitor her progress.  OSA (obstructive sleep apnea) Vanessa Christensen will continue wearing CPAP and she will follow up at the agreed upon time.  Vitamin D Deficiency Vanessa Christensen was informed that low vitamin D levels contributes to fatigue and are associated with obesity, breast, and colon cancer. She will continue to take prescription Vit D @50 ,000 IU every week and will follow up for routine testing of vitamin D, at least 2-3 times per year. She was informed of the risk of over-replacement of vitamin D and agrees to not increase her dose unless she discusses this with [name] first. We will check vitamin D level and Vanessa Christensen will follow up as directed.  Obesity Vanessa Christensen is currently in the action stage of change. As such, her goal is to continue with weight loss efforts She has agreed to follow the Category 4 plan Vanessa Christensen will continue her exercise regimen (recumbent bike) for weight loss and overall health benefits. We discussed the following Behavioral Modification Strategies today: increase H2O intake, no skipping meals, keeping healthy foods in the home, increasing lean protein intake, decreasing simple carbohydrates, increasing vegetables, decrease eating out and  work on [employer] and easy cooking plans Vanessa Christensen will weigh herself at home and record before each visit. She will add in more protein. Vanessa Christensen will factor in some snacks.  Vanessa Christensen has agreed to follow up with our clinic in 2 weeks. She was informed of the importance of  frequent follow up visits to maximize her success with intensive lifestyle modifications for her multiple health conditions.  ALLERGIES: Allergies  Allergen Reactions  . Neulasta [Pegfilgrastim] Other (See Comments)    Raised lumps on legs, arms - had to be excised =  Happened every time pt received Neulasta.    MEDICATIONS: Current Outpatient Medications on File Prior to Visit  Medication Sig Dispense Refill  . Ascorbic Acid (VITAMIN C ER PO) Take 1 tablet by mouth daily.     Marland Kitchen aspirin 81 MG tablet Take 81 mg by mouth daily.    . AZOPT 1 % ophthalmic suspension PLACE 1 DROP INTO BOTH EYES TWICE DAILY  12  . b complex vitamins tablet Take 1 tablet by mouth daily.    Marland Kitchen escitalopram (LEXAPRO) 20 MG tablet TAKE 1 & 1/2 TABLETS BY MOUTH DAILY 135 tablet 3  . levothyroxine (SYNTHROID, LEVOTHROID) 75 MCG tablet TAKE 1 TABLET BY MOUTH EVERY DAY 90 tablet 1  . Multiple Vitamins-Minerals (MULTIVITAMIN ADULTS 50+ PO) Take 1 tablet by mouth daily.     . psyllium (METAMUCIL) 58.6 % powder Take 1 packet by mouth daily.     Marland Kitchen tolterodine (DETROL LA) 4 MG 24 hr capsule TAKE 1 CAPSULE BY MOUTH EVERY DAY 30 capsule 6  . Vitamin D, Ergocalciferol, (DRISDOL) 1.25 MG (50000 UT) CAPS capsule Take 1 capsule (50,000 Units total) by mouth every 7 (seven) days. 4 capsule 0   No current facility-administered medications on file prior to visit.     PAST MEDICAL HISTORY: Past Medical History:  Diagnosis Date  . Anxiety    pt denies  . Arthritis    hands and feet  . Cancer ([place]) 10/04/2006   breast  . Cataracts, both eyes   . Depression   . Depression   . Diverticulosis of colon   . Glaucoma   . Heart murmur   . Joint pain   . Knee pain   . Obesity   . OSA on CPAP   . Osteoarthritis   . Sleep apnea   . Thyroid activity decreased     PAST SURGICAL HISTORY: Past Surgical History:  Procedure Laterality Date  . BREAST SURGERY Bilateral   . CATARACT EXTRACTION    . GLAUCOMA REPAIR    .  MASTECTOMY, RADICAL Bilateral   . neulasta induced sterile abscesses    . THYROIDECTOMY, PARTIAL    . TOTAL KNEE ARTHROPLASTY Right     SOCIAL HISTORY: Social History   Tobacco Use  . Smoking status: Former Smoker    Last attempt to quit: 11/03/1978    Years since quitting: 40.0  . Smokeless tobacco: Never Used  Substance Use Topics  . Alcohol use: No  . Drug use: No    FAMILY HISTORY: Family History  Problem Relation Age of Onset  . CVA Mother   . Cancer Mother 36       breast cancer   . Heart disease Mother   . Stroke Mother   . Obesity Mother   . Leukemia Father   . High blood pressure Father   . High Cholesterol Father   . Heart disease Father   . Cancer Maternal Aunt 88  breast cancer   . Cancer Maternal Aunt 55       breast cancer  . Cancer Cousin 32       breast cancer    ROS: Review of Systems  Constitutional: Positive for weight loss.  Gastrointestinal: Negative for nausea and vomiting.  Musculoskeletal:       Negative for muscle weakness  Psychiatric/Behavioral: The patient does not have insomnia.     PHYSICAL EXAM: Pt in no acute distress  RECENT LABS AND TESTS: BMET    Component Value Date/Time   NA 140 07/31/2018 1013   NA 141 07/13/2016 1015   K 4.5 07/31/2018 1013   K 4.3 07/13/2016 1015   CL 105 07/31/2018 1013   CO2 26 07/31/2018 1013   CO2 24 07/13/2016 1015   GLUCOSE 78 07/31/2018 1013   GLUCOSE 89 07/13/2016 1015   BUN 14 07/31/2018 1013   BUN 17.2 07/13/2016 1015   CREATININE 0.80 07/31/2018 1013   CREATININE 0.7 07/13/2016 1015   CALCIUM 9.5 07/31/2018 1013   CALCIUM 9.6 07/13/2016 1015   GFRNONAA >60 07/31/2018 1013   GFRAA >60 07/31/2018 1013   Lab Results  Component Value Date   HGBA1C 5.6 08/13/2018   Lab Results  Component Value Date   INSULIN 13.2 08/13/2018   CBC    Component Value Date/Time   WBC 4.6 07/31/2018 1013   RBC 4.38 07/31/2018 1013   HGB 12.1 07/31/2018 1013   HGB 12.2 07/13/2016 1015    HCT 39.0 07/31/2018 1013   HCT 36.9 07/13/2016 1015   PLT 190 07/31/2018 1013   PLT 183 07/13/2016 1015   MCV 89.0 07/31/2018 1013   MCV 84.3 07/13/2016 1015   MCH 27.6 07/31/2018 1013   MCHC 31.0 07/31/2018 1013   RDW 14.2 07/31/2018 1013   RDW 15.2 (H) 07/13/2016 1015   LYMPHSABS 1.3 07/31/2018 1013   LYMPHSABS 1.3 07/13/2016 1015   MONOABS 0.5 07/31/2018 1013   MONOABS 0.5 07/13/2016 1015   EOSABS 0.0 07/31/2018 1013   EOSABS 0.0 07/13/2016 1015   BASOSABS 0.0 07/31/2018 1013   BASOSABS 0.0 07/13/2016 1015   Iron/TIBC/Ferritin/ %Sat No results found for: IRON, TIBC, FERRITIN, IRONPCTSAT Lipid Panel     Component Value Date/Time   CHOL 170 05/08/2018 0957   TRIG 114.0 05/08/2018 0957   HDL 43.40 05/08/2018 0957   CHOLHDL 4 05/08/2018 0957   VLDL 22.8 05/08/2018 0957   LDLCALC 103 (H) 05/08/2018 0957   Hepatic Function Panel     Component Value Date/Time   PROT 7.3 07/31/2018 1013   PROT 6.7 07/13/2016 1015   ALBUMIN 4.1 07/31/2018 1013   ALBUMIN 4.0 07/13/2016 1015   AST 24 07/31/2018 1013   AST 22 07/13/2016 1015   ALT 27 07/31/2018 1013   ALT 28 07/13/2016 1015   ALKPHOS 87 07/31/2018 1013   ALKPHOS 70 07/13/2016 1015   BILITOT 0.5 07/31/2018 1013   BILITOT 0.57 07/13/2016 1015   BILIDIR 0.1 05/08/2018 0957      Component Value Date/Time   TSH 1.05 05/08/2018 0957   TSH 1.32 11/07/2017 0921   TSH 0.86 05/09/2017 1006    Results for AVALIN, BRILEY (MRN [id number]) as of 10/29/2018 16:03  Ref. Range 08/13/2018 14:19  Vitamin D, 25-Hydroxy Latest Ref Range: 30.0 - 100.0 ng/mL 35.0    I, Doreene Nest, am acting as Location manager for [employer]. [name], DO  I have reviewed the above documentation for accuracy and completeness, and I agree with the above. -  Jearld Lesch, DO

## 2018-11-04 DIAGNOSIS — E8881 Metabolic syndrome: Secondary | ICD-10-CM | POA: Diagnosis not present

## 2018-11-04 DIAGNOSIS — E559 Vitamin D deficiency, unspecified: Secondary | ICD-10-CM | POA: Diagnosis not present

## 2018-11-05 LAB — COMPREHENSIVE METABOLIC PANEL
ALT: 35 IU/L — ABNORMAL HIGH (ref 0–32)
AST: 30 IU/L (ref 0–40)
Albumin/Globulin Ratio: 2.1 (ref 1.2–2.2)
Albumin: 4.4 g/dL (ref 3.8–4.8)
Alkaline Phosphatase: 94 IU/L (ref 39–117)
BUN/Creatinine Ratio: 22 (ref 12–28)
BUN: 17 mg/dL (ref 8–27)
Bilirubin Total: 0.4 mg/dL (ref 0.0–1.2)
CO2: 25 mmol/L (ref 20–29)
Calcium: 9.9 mg/dL (ref 8.7–10.3)
Chloride: 104 mmol/L (ref 96–106)
Creatinine, Ser: 0.78 mg/dL (ref 0.57–1.00)
GFR calc Af Amer: 90 mL/min/{1.73_m2} (ref >59)
GFR calc non Af Amer: 78 mL/min/{1.73_m2} (ref >59)
Globulin, Total: 2.1 g/dL (ref 1.5–4.5)
Glucose: 89 mg/dL (ref 65–99)
Potassium: 4.8 mmol/L (ref 3.5–5.2)
Sodium: 142 mmol/L (ref 134–144)
Total Protein: 6.5 g/dL (ref 6.0–8.5)

## 2018-11-05 LAB — INSULIN, RANDOM: INSULIN: 10.9 u[IU]/mL (ref 2.6–24.9)

## 2018-11-05 LAB — HEMOGLOBIN A1C
Est. average glucose Bld gHb Est-mCnc: 108 mg/dL
Hgb A1c MFr Bld: 5.4 % (ref 4.8–5.6)

## 2018-11-05 LAB — VITAMIN D 25 HYDROXY (VIT D DEFICIENCY, FRACTURES): Vit D, 25-Hydroxy: 42.4 ng/mL (ref 30.0–100.0)

## 2018-11-12 ENCOUNTER — Encounter (INDEPENDENT_AMBULATORY_CARE_PROVIDER_SITE_OTHER): Payer: Self-pay | Admitting: Bariatrics

## 2018-11-12 ENCOUNTER — Other Ambulatory Visit: Payer: Self-pay

## 2018-11-12 ENCOUNTER — Ambulatory Visit (INDEPENDENT_AMBULATORY_CARE_PROVIDER_SITE_OTHER): Payer: Medicare Other | Admitting: Bariatrics

## 2018-11-12 DIAGNOSIS — E559 Vitamin D deficiency, unspecified: Secondary | ICD-10-CM | POA: Diagnosis not present

## 2018-11-12 DIAGNOSIS — E8881 Metabolic syndrome: Secondary | ICD-10-CM

## 2018-11-12 DIAGNOSIS — Z6841 Body Mass Index (BMI) 40.0 and over, adult: Secondary | ICD-10-CM | POA: Diagnosis not present

## 2018-11-13 NOTE — Progress Notes (Signed)
Office: [phone]  /  Fax: [phone] TeleHealth Visit:  Vanessa Christensen has verbally consented to this TeleHealth visit today. The patient is located at home, the provider is located at the [employer] [facility] office. The participants in this visit include the listed provider and patient and any and all parties involved. The visit was conducted today via Skype.  HPI:   Chief Complaint: OBESITY Vanessa Christensen is here to discuss her progress with her obesity treatment plan. She is on the Category 4 plan and is following her eating plan approximately 90 % of the time. She states she is doing cardio exercise for 60 minutes 1 time per week and she is biking for 35 minutes 1 time per week. Vanessa Christensen states that she is doing well and has lost 2 pounds (weight 219 lbs). We were unable to weigh the patient today for this TeleHealth visit. She feels as if she has lost weight since her last visit. She has lost 22 lbs since starting treatment with [name].  Insulin Resistance Tuesday has a diagnosis of insulin resistance based on her elevated fasting insulin level >5. Although Dewanda's blood glucose readings are still under good control, insulin resistance puts her at greater risk of metabolic syndrome and diabetes. Her last A1c was at 5.4 and last insulin level was at 10.9 Vanessa Christensen is not taking metformin currently and she continues to work on diet and exercise to decrease risk of diabetes.  Vitamin D deficiency Vanessa Christensen has a diagnosis of vitamin D deficiency. Her last vitamin D level was at 42.4 Vanessa Christensen is taking high dose vit D and she denies nausea, vomiting or muscle weakness.  ASSESSMENT AND PLAN:  Insulin resistance  Vitamin D deficiency  Class 3 severe obesity with serious comorbidity and body mass index (BMI) of 40.0 to 44.9 in adult, unspecified obesity type ([place])  PLAN:  Insulin Resistance Vanessa Christensen will continue to work on weight loss, exercise, increasing lean protein and decreasing simple  carbohydrates in her diet to help decrease the risk of diabetes. She was informed that eating too many simple carbohydrates or too many calories at one sitting increases the likelihood of GI side effects. Kaori agreed to follow up with [name] as directed to monitor her progress.  Vitamin D Deficiency Vanessa Christensen was informed that low vitamin D levels contributes to fatigue and are associated with obesity, breast, and colon cancer. She agrees to switch to OTC Vitamin D 2,000 IU daily and will follow up for routine testing of vitamin D, at least 2-3 times per year. She was informed of the risk of over-replacement of vitamin D and agrees to not increase her dose unless she discusses this with [name] first. Vanessa Christensen will continue her gym classes in the morning.  Obesity Vanessa Christensen is currently in the action stage of change. As such, her goal is to continue with weight loss efforts She has agreed to follow the Category 4 plan Vanessa Christensen will continue her exercise regimen and she will increase exercise for weight loss and overall health benefits. We discussed the following Behavioral Modification Strategies today: increase H2O intake, no skipping meals, keeping healthy foods in the home, increasing lean protein intake, decreasing simple carbohydrates (will send fruit handouts to patient), increasing vegetables, decrease eating out and work on meal planning and easy cooking plans Vanessa Christensen will weigh herself at home and record. Alternatives for sweeteners and fats were given to patient today.  Vanessa Christensen has agreed to follow up with our clinic in 2 weeks. She was informed of  the importance of frequent follow up visits to maximize her success with intensive lifestyle modifications for her multiple health conditions.  ALLERGIES: Allergies  Allergen Reactions  . Neulasta [Pegfilgrastim] Other (See Comments)    Raised lumps on legs, arms - had to be excised =  Happened every time pt received Neulasta.    MEDICATIONS: Current  Outpatient Medications on File Prior to Visit  Medication Sig Dispense Refill  . Ascorbic Acid (VITAMIN C ER PO) Take 1 tablet by mouth daily.     Marland Kitchen aspirin 81 MG tablet Take 81 mg by mouth daily.    . AZOPT 1 % ophthalmic suspension PLACE 1 DROP INTO BOTH EYES TWICE DAILY  12  . b complex vitamins tablet Take 1 tablet by mouth daily.    Marland Kitchen escitalopram (LEXAPRO) 20 MG tablet TAKE 1 & 1/2 TABLETS BY MOUTH DAILY 135 tablet 3  . levothyroxine (SYNTHROID, LEVOTHROID) 75 MCG tablet TAKE 1 TABLET BY MOUTH EVERY DAY 90 tablet 1  . Multiple Vitamins-Minerals (MULTIVITAMIN ADULTS 50+ PO) Take 1 tablet by mouth daily.     . psyllium (METAMUCIL) 58.6 % powder Take 1 packet by mouth daily.     Marland Kitchen tolterodine (DETROL LA) 4 MG 24 hr capsule TAKE 1 CAPSULE BY MOUTH EVERY DAY 30 capsule 6  . Vitamin D, Ergocalciferol, (DRISDOL) 1.25 MG (50000 UT) CAPS capsule Take 1 capsule (50,000 Units total) by mouth every 7 (seven) days. 4 capsule 0   No current facility-administered medications on file prior to visit.     PAST MEDICAL HISTORY: Past Medical History:  Diagnosis Date  . Anxiety    pt denies  . Arthritis    hands and feet  . Cancer ([place]) 10/04/2006   breast  . Cataracts, both eyes   . Depression   . Depression   . Diverticulosis of colon   . Glaucoma   . Heart murmur   . Joint pain   . Knee pain   . Obesity   . OSA on CPAP   . Osteoarthritis   . Sleep apnea   . Thyroid activity decreased     PAST SURGICAL HISTORY: Past Surgical History:  Procedure Laterality Date  . BREAST SURGERY Bilateral   . CATARACT EXTRACTION    . GLAUCOMA REPAIR    . MASTECTOMY, RADICAL Bilateral   . neulasta induced sterile abscesses    . THYROIDECTOMY, PARTIAL    . TOTAL KNEE ARTHROPLASTY Right     SOCIAL HISTORY: Social History   Tobacco Use  . Smoking status: Former Smoker    Last attempt to quit: 11/03/1978    Years since quitting: 40.0  . Smokeless tobacco: Never Used  Substance Use Topics  .  Alcohol use: No  . Drug use: No    FAMILY HISTORY: Family History  Problem Relation Age of Onset  . CVA Mother   . Cancer Mother 36       breast cancer   . Heart disease Mother   . Stroke Mother   . Obesity Mother   . Leukemia Father   . High blood pressure Father   . High Cholesterol Father   . Heart disease Father   . Cancer Maternal Aunt 55       breast cancer   . Cancer Maternal Aunt 55       breast cancer  . Cancer Cousin 32       breast cancer    ROS: Review of Systems  Constitutional: Positive for weight  loss.  Gastrointestinal: Negative for nausea and vomiting.  Musculoskeletal:       Negative for muscle weakness    PHYSICAL EXAM: Pt in no acute distress  RECENT LABS AND TESTS: BMET    Component Value Date/Time   NA 142 11/04/2018 0925   NA 141 07/13/2016 1015   K 4.8 11/04/2018 0925   K 4.3 07/13/2016 1015   CL 104 11/04/2018 0925   CO2 25 11/04/2018 0925   CO2 24 07/13/2016 1015   GLUCOSE 89 11/04/2018 0925   GLUCOSE 78 07/31/2018 1013   GLUCOSE 89 07/13/2016 1015   BUN 17 11/04/2018 0925   BUN 17.2 07/13/2016 1015   CREATININE 0.78 11/04/2018 0925   CREATININE 0.7 07/13/2016 1015   CALCIUM 9.9 11/04/2018 0925   CALCIUM 9.6 07/13/2016 1015   GFRNONAA 78 11/04/2018 0925   GFRAA 90 11/04/2018 0925   Lab Results  Component Value Date   HGBA1C 5.4 11/04/2018   HGBA1C 5.6 08/13/2018   Lab Results  Component Value Date   INSULIN 10.9 11/04/2018   INSULIN 13.2 08/13/2018   CBC    Component Value Date/Time   WBC 4.6 07/31/2018 1013   RBC 4.38 07/31/2018 1013   HGB 12.1 07/31/2018 1013   HGB 12.2 07/13/2016 1015   HCT 39.0 07/31/2018 1013   HCT 36.9 07/13/2016 1015   PLT 190 07/31/2018 1013   PLT 183 07/13/2016 1015   MCV 89.0 07/31/2018 1013   MCV 84.3 07/13/2016 1015   MCH 27.6 07/31/2018 1013   MCHC 31.0 07/31/2018 1013   RDW 14.2 07/31/2018 1013   RDW 15.2 (H) 07/13/2016 1015   LYMPHSABS 1.3 07/31/2018 1013   LYMPHSABS 1.3  07/13/2016 1015   MONOABS 0.5 07/31/2018 1013   MONOABS 0.5 07/13/2016 1015   EOSABS 0.0 07/31/2018 1013   EOSABS 0.0 07/13/2016 1015   BASOSABS 0.0 07/31/2018 1013   BASOSABS 0.0 07/13/2016 1015   Iron/TIBC/Ferritin/ %Sat No results found for: IRON, TIBC, FERRITIN, IRONPCTSAT Lipid Panel     Component Value Date/Time   CHOL 170 05/08/2018 0957   TRIG 114.0 05/08/2018 0957   HDL 43.40 05/08/2018 0957   CHOLHDL 4 05/08/2018 0957   VLDL 22.8 05/08/2018 0957   LDLCALC 103 (H) 05/08/2018 0957   Hepatic Function Panel     Component Value Date/Time   PROT 6.5 11/04/2018 0925   PROT 6.7 07/13/2016 1015   ALBUMIN 4.4 11/04/2018 0925   ALBUMIN 4.0 07/13/2016 1015   AST 30 11/04/2018 0925   AST 22 07/13/2016 1015   ALT 35 (H) 11/04/2018 0925   ALT 28 07/13/2016 1015   ALKPHOS 94 11/04/2018 0925   ALKPHOS 70 07/13/2016 1015   BILITOT 0.4 11/04/2018 0925   BILITOT 0.57 07/13/2016 1015   BILIDIR 0.1 05/08/2018 0957      Component Value Date/Time   TSH 1.05 05/08/2018 0957   TSH 1.32 11/07/2017 0921   TSH 0.86 05/09/2017 1006     Ref. Range 11/04/2018 09:25  Vitamin D, 25-Hydroxy Latest Ref Range: 30.0 - 100.0 ng/mL 42.4    I, Doreene Nest, am acting as Location manager for [employer]. [name], DO  I have reviewed the above documentation for accuracy and completeness, and I agree with the above. -Jearld Lesch, DO

## 2018-11-21 DIAGNOSIS — H401131 Primary open-angle glaucoma, bilateral, mild stage: Secondary | ICD-10-CM | POA: Diagnosis not present

## 2018-11-21 DIAGNOSIS — H52203 Unspecified astigmatism, bilateral: Secondary | ICD-10-CM | POA: Diagnosis not present

## 2018-11-21 DIAGNOSIS — Z961 Presence of intraocular lens: Secondary | ICD-10-CM | POA: Diagnosis not present

## 2018-11-26 ENCOUNTER — Encounter (INDEPENDENT_AMBULATORY_CARE_PROVIDER_SITE_OTHER): Payer: Self-pay | Admitting: Bariatrics

## 2018-11-26 ENCOUNTER — Other Ambulatory Visit: Payer: Self-pay

## 2018-11-26 ENCOUNTER — Telehealth (INDEPENDENT_AMBULATORY_CARE_PROVIDER_SITE_OTHER): Payer: Medicare Other | Admitting: Bariatrics

## 2018-11-26 DIAGNOSIS — E8881 Metabolic syndrome: Secondary | ICD-10-CM

## 2018-11-26 DIAGNOSIS — Z6841 Body Mass Index (BMI) 40.0 and over, adult: Secondary | ICD-10-CM

## 2018-11-26 DIAGNOSIS — E559 Vitamin D deficiency, unspecified: Secondary | ICD-10-CM | POA: Diagnosis not present

## 2018-11-26 NOTE — Progress Notes (Signed)
Office: [phone]  /  Fax: [phone] TeleHealth Visit:  Vanessa Christensen has verbally consented to this TeleHealth visit today. The patient is located at home, the provider is located at the [facility] office. The participants in this visit include the listed provider and patient. The visit was conducted today via Skype.  HPI:   Chief Complaint: OBESITY Vanessa Christensen is here to discuss her progress with her obesity treatment plan. She is on the Category 4 plan and is following her eating plan approximately 95% of the time. She states she is exercising 0 minutes 0 times per week. Vanessa Christensen states that she was down 2 lbs but gained it back because of birthday cake.  We were unable to weigh the patient today for this TeleHealth visit. She feels as if she has gained weight since her last visit. She has lost 0 lbs since starting treatment with [name].  Insulin Resistance Vanessa Christensen has a diagnosis of insulin resistance based on her elevated fasting insulin level >5. Although Vanessa Christensen's blood glucose readings are still under good control, insulin resistance puts her at greater risk of metabolic syndrome and diabetes. Her last A1c was 5.4 on 11/04/2018 and insulin was 10.9. She is not taking any medications currently and continues to work on diet and exercise to decrease risk of diabetes.  Vitamin D deficiency Vanessa Christensen has a diagnosis of Vitamin D deficiency. Her last Vitamin D level was reported to be 42.4 on 11/04/2018. She is currently taking OTC Vit D and denies nausea, vomiting or muscle weakness.  ASSESSMENT AND PLAN:  Insulin resistance  Vitamin D deficiency  Class 3 severe obesity with serious comorbidity and body mass index (BMI) of 40.0 to 44.9 in adult, unspecified obesity type ([place])  PLAN:  Insulin Resistance Vanessa Christensen will continue to work on weight loss, exercise, and decreasing simple carbohydrates in her diet to help decrease the risk of diabetes. We dicussed metformin including  benefits and risks. She was informed that eating too many simple carbohydrates or too many calories at one sitting increases the likelihood of GI side effects. Vanessa Christensen was instructed to decrease carbohydrates, increase protein, and increase activity. She will follow-up with [name] as directed to monitor her progress.  Vitamin D Deficiency Vanessa Christensen was informed that low Vitamin D levels contributes to fatigue and are associated with obesity, breast, and colon cancer. She agrees to continue taking OTC Vit D and will follow-up for routine testing of Vitamin D, at least 2-3 times per year. She was informed of the risk of over-replacement of Vitamin D and agrees to not increase her dose unless she discusses this with [name] first. Vanessa Christensen agrees to follow-up with our clinic in 2 weeks.  Obesity Vanessa Christensen is currently in the action stage of change. As such, her goal is to continue with weight loss efforts. She has agreed to follow the Category 4 plan. Vanessa Christensen will weigh herself at home until she returns to the office and work on meal planning. Vanessa Christensen has been instructed to start recumbent bike several times a week for weight loss and overall health benefits. We discussed the following Behavioral Modification Strategies today: increasing lean protein intake, decreasing simple carbohydrates, increasing vegetables, increase H20 intake, decrease eating out, no skipping meals, work on meal planning and easy cooking plans, keeping healthy foods in the home, better snacking choices, and planning for success.  Vanessa Christensen has agreed to follow-up with our clinic in 2 weeks. She was informed of the importance of frequent follow-up visits to maximize her  success with intensive lifestyle modifications for her multiple health conditions.  ALLERGIES: Allergies  Allergen Reactions  . Neulasta [Pegfilgrastim] Other (See Comments)    Raised lumps on legs, arms - had to be excised =  Happened every time pt received Neulasta.     MEDICATIONS: Current Outpatient Medications on File Prior to Visit  Medication Sig Dispense Refill  . Ascorbic Acid (VITAMIN C ER PO) Take 1 tablet by mouth daily.     Marland Kitchen aspirin 81 MG tablet Take 81 mg by mouth daily.    . AZOPT 1 % ophthalmic suspension PLACE 1 DROP INTO BOTH EYES TWICE DAILY  12  . b complex vitamins tablet Take 1 tablet by mouth daily.    Marland Kitchen escitalopram (LEXAPRO) 20 MG tablet TAKE 1 & 1/2 TABLETS BY MOUTH DAILY 135 tablet 3  . levothyroxine (SYNTHROID, LEVOTHROID) 75 MCG tablet TAKE 1 TABLET BY MOUTH EVERY DAY 90 tablet 1  . Multiple Vitamins-Minerals (MULTIVITAMIN ADULTS 50+ PO) Take 1 tablet by mouth daily.     . psyllium (METAMUCIL) 58.6 % powder Take 1 packet by mouth daily.     Marland Kitchen tolterodine (DETROL LA) 4 MG 24 hr capsule TAKE 1 CAPSULE BY MOUTH EVERY DAY 30 capsule 6  . Vitamin D, Ergocalciferol, (DRISDOL) 1.25 MG (50000 UT) CAPS capsule Take 1 capsule (50,000 Units total) by mouth every 7 (seven) days. 4 capsule 0   No current facility-administered medications on file prior to visit.     PAST MEDICAL HISTORY: Past Medical History:  Diagnosis Date  . Anxiety    pt denies  . Arthritis    hands and feet  . Cancer ([place]) 10/04/2006   breast  . Cataracts, both eyes   . Depression   . Depression   . Diverticulosis of colon   . Glaucoma   . Heart murmur   . Joint pain   . Knee pain   . Obesity   . OSA on CPAP   . Osteoarthritis   . Sleep apnea   . Thyroid activity decreased     PAST SURGICAL HISTORY: Past Surgical History:  Procedure Laterality Date  . BREAST SURGERY Bilateral   . CATARACT EXTRACTION    . GLAUCOMA REPAIR    . MASTECTOMY, RADICAL Bilateral   . neulasta induced sterile abscesses    . THYROIDECTOMY, PARTIAL    . TOTAL KNEE ARTHROPLASTY Right     SOCIAL HISTORY: Social History   Tobacco Use  . Smoking status: Former Smoker    Quit date: 11/03/1978    Years since quitting: 40.0  . Smokeless tobacco: Never Used  Substance Use  Topics  . Alcohol use: No  . Drug use: No    FAMILY HISTORY: Family History  Problem Relation Age of Onset  . CVA Mother   . Cancer Mother 53       breast cancer   . Heart disease Mother   . Stroke Mother   . Obesity Mother   . Leukemia Father   . High blood pressure Father   . High Cholesterol Father   . Heart disease Father   . Cancer Maternal Aunt 55       breast cancer   . Cancer Maternal Aunt 55       breast cancer  . Cancer Cousin 32       breast cancer   ROS: Review of Systems  Gastrointestinal: Negative for nausea and vomiting.  Musculoskeletal:       Negative for muscle  weakness.   PHYSICAL EXAM: Pt in no acute distress  RECENT LABS AND TESTS: BMET    Component Value Date/Time   NA 142 11/04/2018 0925   NA 141 07/13/2016 1015   K 4.8 11/04/2018 0925   K 4.3 07/13/2016 1015   CL 104 11/04/2018 0925   CO2 25 11/04/2018 0925   CO2 24 07/13/2016 1015   GLUCOSE 89 11/04/2018 0925   GLUCOSE 78 07/31/2018 1013   GLUCOSE 89 07/13/2016 1015   BUN 17 11/04/2018 0925   BUN 17.2 07/13/2016 1015   CREATININE 0.78 11/04/2018 0925   CREATININE 0.7 07/13/2016 1015   CALCIUM 9.9 11/04/2018 0925   CALCIUM 9.6 07/13/2016 1015   GFRNONAA 78 11/04/2018 0925   GFRAA 90 11/04/2018 0925   Lab Results  Component Value Date   HGBA1C 5.4 11/04/2018   HGBA1C 5.6 08/13/2018   Lab Results  Component Value Date   INSULIN 10.9 11/04/2018   INSULIN 13.2 08/13/2018   CBC    Component Value Date/Time   WBC 4.6 07/31/2018 1013   RBC 4.38 07/31/2018 1013   HGB 12.1 07/31/2018 1013   HGB 12.2 07/13/2016 1015   HCT 39.0 07/31/2018 1013   HCT 36.9 07/13/2016 1015   PLT 190 07/31/2018 1013   PLT 183 07/13/2016 1015   MCV 89.0 07/31/2018 1013   MCV 84.3 07/13/2016 1015   MCH 27.6 07/31/2018 1013   MCHC 31.0 07/31/2018 1013   RDW 14.2 07/31/2018 1013   RDW 15.2 (H) 07/13/2016 1015   LYMPHSABS 1.3 07/31/2018 1013   LYMPHSABS 1.3 07/13/2016 1015   MONOABS 0.5  07/31/2018 1013   MONOABS 0.5 07/13/2016 1015   EOSABS 0.0 07/31/2018 1013   EOSABS 0.0 07/13/2016 1015   BASOSABS 0.0 07/31/2018 1013   BASOSABS 0.0 07/13/2016 1015   Iron/TIBC/Ferritin/ %Sat No results found for: IRON, TIBC, FERRITIN, IRONPCTSAT Lipid Panel     Component Value Date/Time   CHOL 170 05/08/2018 0957   TRIG 114.0 05/08/2018 0957   HDL 43.40 05/08/2018 0957   CHOLHDL 4 05/08/2018 0957   VLDL 22.8 05/08/2018 0957   LDLCALC 103 (H) 05/08/2018 0957   Hepatic Function Panel     Component Value Date/Time   PROT 6.5 11/04/2018 0925   PROT 6.7 07/13/2016 1015   ALBUMIN 4.4 11/04/2018 0925   ALBUMIN 4.0 07/13/2016 1015   AST 30 11/04/2018 0925   AST 22 07/13/2016 1015   ALT 35 (H) 11/04/2018 0925   ALT 28 07/13/2016 1015   ALKPHOS 94 11/04/2018 0925   ALKPHOS 70 07/13/2016 1015   BILITOT 0.4 11/04/2018 0925   BILITOT 0.57 07/13/2016 1015   BILIDIR 0.1 05/08/2018 0957      Component Value Date/Time   TSH 1.05 05/08/2018 0957   TSH 1.32 11/07/2017 0921   TSH 0.86 05/09/2017 1006   Results for ALYNE, MARTINSON (MRN [id number]) as of 11/26/2018 16:41  Ref. Range 11/04/2018 09:25  Vitamin D, 25-Hydroxy Latest Ref Range: 30.0 - 100.0 ng/mL 42.4    I, Michaelene Song, am acting as Location manager for [employer], DO  I have reviewed the above documentation for accuracy and completeness, and I agree with the above. -Jearld Lesch, DO

## 2018-11-27 ENCOUNTER — Other Ambulatory Visit: Payer: Self-pay | Admitting: Family Medicine

## 2018-12-02 ENCOUNTER — Encounter: Payer: Self-pay | Admitting: Physician Assistant

## 2018-12-02 ENCOUNTER — Ambulatory Visit: Payer: Self-pay | Admitting: Family Medicine

## 2018-12-02 ENCOUNTER — Ambulatory Visit (INDEPENDENT_AMBULATORY_CARE_PROVIDER_SITE_OTHER): Payer: Medicare Other | Admitting: Physician Assistant

## 2018-12-02 ENCOUNTER — Other Ambulatory Visit: Payer: Medicare Other

## 2018-12-02 ENCOUNTER — Other Ambulatory Visit: Payer: Self-pay

## 2018-12-02 VITALS — BP 134/71 | HR 81 | Temp 98.4°F

## 2018-12-02 DIAGNOSIS — Z20822 Contact with and (suspected) exposure to covid-19: Secondary | ICD-10-CM

## 2018-12-02 DIAGNOSIS — Z20828 Contact with and (suspected) exposure to other viral communicable diseases: Secondary | ICD-10-CM

## 2018-12-02 NOTE — Progress Notes (Signed)
I have discussed the procedure for the virtual visit with the patient who has given consent to proceed with assessment and treatment.   Ereka Brau S Barbie Croston, CMA     

## 2018-12-02 NOTE — Telephone Encounter (Signed)
Pt will need Video Visit in order to arrange testing

## 2018-12-02 NOTE — Telephone Encounter (Signed)
VV has been scheduled with Einar Pheasant

## 2018-12-02 NOTE — Progress Notes (Signed)
Virtual Visit via Video   I connected with patient on 12/02/18 at  4:10 PM EDT by a video enabled telemedicine application and verified that I am speaking with the correct person using two identifiers.  Location patient: Home Location provider: [facility] Persons participating in the virtual visit: Patient, Provider, Ali Chukson (Patina Moore)  I discussed the limitations of evaluation and management by telemedicine and the availability of in person appointments. The patient expressed understanding and agreed to proceed.  Subjective:   HPI:   Patient presents via [employer]Me to discuss COVID testing. Notes she and her husband kept their grandson for a few days, from 6/22-6/24. Just found out last week that he had close exposure to COVID via a classmate he was around last Thursday. They were not made aware of this until the end of last week. Yolanda Bonine is asymptomatic.Underwent testing Thursday but still waiting on results. She notes both she and her husband are asymptomatic. Have been practicing social distancing from others and generally stay home except to go to grocery store, noting she wears masks and gloves and keeps hands washed. Is wanting to potentially be tested.   ROS:   See pertinent positives and negatives per HPI.  Patient Active Problem List   Diagnosis Date Noted  . Vitamin D deficiency 08/29/2018  . 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 Class 3 severe obesity with serious comorbidity and body mass index (BMI) of 40.0 to 44.9 in adult ([place]) 08/14/2018  . OSA (obstructive sleep apnea) 08/14/2018  . 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 Incontinence of urine in female 11/07/2017  . Vertigo 11/07/2017  . Left knee pain 10/25/2016  . Genetic testing 08/08/2016  . Hypothyroidism 12/02/2015  . Anxiety and depression 12/02/2015  . OAB (overactive bladder) 12/02/2015  . Breast cancer of upper-outer quadrant of left female breast ([place]) 12/02/2015  . Severe obesity (BMI >=  40) ([facility]) 12/02/2015    Social History   Tobacco Use  . Smoking status: Former Smoker    Quit date: 11/03/1978    Years since quitting: 40.1  . Smokeless tobacco: Never Used  Substance Use Topics  . Alcohol use: No    Current Outpatient Medications:  .  Ascorbic Acid (VITAMIN C ER PO), Take 1 tablet by mouth daily. , Disp: , Rfl:  .  aspirin 81 MG tablet, Take 81 mg by mouth daily., Disp: , Rfl:  .  AZOPT 1 % ophthalmic suspension, PLACE 1 DROP INTO BOTH EYES TWICE DAILY, Disp: , Rfl: 12 .  b complex vitamins tablet, Take 1 tablet by mouth daily., Disp: , Rfl:  .  escitalopram (LEXAPRO) 20 MG tablet, TAKE 1 & 1/2 TABLETS BY MOUTH DAILY, Disp: 135 tablet, Rfl: 3 .  levothyroxine (SYNTHROID, LEVOTHROID) 75 MCG tablet, TAKE 1 TABLET BY MOUTH EVERY DAY, Disp: 90 tablet, Rfl: 1 .  Multiple Vitamins-Minerals (MULTIVITAMIN ADULTS 50+ PO), Take 1 tablet by mouth daily. , Disp: , Rfl:  .  psyllium (METAMUCIL) 58.6 % powder, Take 1 packet by mouth daily. , Disp: , Rfl:  .  tolterodine (DETROL LA) 4 MG 24 hr capsule, TAKE 1 CAPSULE BY MOUTH EVERY DAY, Disp: 90 capsule, Rfl: 2 .  Vitamin D, Ergocalciferol, (DRISDOL) 1.25 MG (50000 UT) CAPS capsule, Take 1 capsule (50,000 Units total) by mouth every 7 (seven) days., Disp: 4 capsule, Rfl: 0  Allergies  Allergen Reactions  . Neulasta [Pegfilgrastim] Other (See Comments)    Raised lumps on legs, arms - had to be excised =  Happened every time pt received Neulasta.    Objective:   Temp 98.4 F (36.9 C) (Oral)   Patient is well-developed, well-nourished in no acute distress.  Resting comfortably  at home.  Head is normocephalic, atraumatic.  No labored breathing.  Speech is clear and coherent with logical contest.  Patient is alert and oriented at baseline.   Assessment and Plan:   1. Exposure to Covid-19 Virus Grandson with exposure but asymptomatic. Test results pending. Patient's last contact with grandson was 5 days ago. Asymptomatic.  Discussed recommendation for awaiting grandson's results first. If negative, would not recommend testing. If positive would recommend testing. For now feel they are still at lower risk. Do not need to self quarantine although recommend staying home when possible to lower risk for subsequent exposures. PPE when around others or going out.     Leeanne Rio, PA-C 12/02/2018

## 2018-12-02 NOTE — Telephone Encounter (Signed)
Pt. Reports her and her husband baby sat a grandchild last week who had a COVID 19 exposure at kinder care. She does not have symptoms, but would like to be tested.Warm transfer to [facility] in the practice.  Answer Assessment - Initial Assessment Questions 1. CLOSE CONTACT: "Who is the person with the confirmed or suspected COVID-19 infection that you were exposed to?"     [place] child 2. PLACE of CONTACT: "Where were you when you were exposed to COVID-19?" (e.g., home, school, medical waiting room; which city?)     Home 3. TYPE of CONTACT: "How much contact was there?" (e.g., sitting next to, live in same house, work in same office, same building)     3 days last week 4. DURATION of CONTACT: "How long were you in contact with the COVID-19 patient?" (e.g., a few seconds, passed by person, a few minutes, live with the patient)     3 days 5. DATE of CONTACT: "When did you have contact with a COVID-19 patient?" (e.g., how many days ago)     Last week 6. TRAVEL: "Have you traveled out of the country recently?" If so, "When and where?"     * Also ask about out-of-state travel, since the CDC has identified some high-risk cities for community spread in the [name].     * Note: Travel becomes less relevant if there is widespread community transmission where the patient lives.     No 7. COMMUNITY SPREAD: "Are there lots of cases of COVID-19 (community spread) where you live?" (See public health department website, if unsure)       Yes 8. SYMPTOMS: "Do you have any symptoms?" (e.g., fever, cough, breathing difficulty)     No 9. PREGNANCY OR POSTPARTUM: "Is there any chance you are pregnant?" "When was your last menstrual period?" "Did you deliver in the last 2 weeks?"     No 10. HIGH RISK: "Do you have any heart or lung problems? Do you have a weak immune system?" (e.g., CHF, COPD, asthma, HIV positive, chemotherapy, renal failure, diabetes mellitus, sickle cell anemia)       No  Protocols used: CORONAVIRUS  (COVID-19) EXPOSURE-A-AH

## 2018-12-10 ENCOUNTER — Telehealth (INDEPENDENT_AMBULATORY_CARE_PROVIDER_SITE_OTHER): Payer: Medicare Other | Admitting: Bariatrics

## 2018-12-10 ENCOUNTER — Other Ambulatory Visit: Payer: Self-pay

## 2018-12-10 ENCOUNTER — Encounter (INDEPENDENT_AMBULATORY_CARE_PROVIDER_SITE_OTHER): Payer: Self-pay | Admitting: Bariatrics

## 2018-12-10 DIAGNOSIS — E8881 Metabolic syndrome: Secondary | ICD-10-CM | POA: Diagnosis not present

## 2018-12-10 DIAGNOSIS — Z6841 Body Mass Index (BMI) 40.0 and over, adult: Secondary | ICD-10-CM

## 2018-12-10 NOTE — Progress Notes (Signed)
Office: [phone]  /  Fax: [phone] TeleHealth Visit:  Vanessa Christensen has verbally consented to this TeleHealth visit today. The patient is located at home, the provider is located at the [facility] office. The participants in this visit include the listed provider and patient. The visit was conducted today via skype.  HPI:   Chief Complaint: OBESITY Vanessa Christensen is here to discuss her progress with her obesity treatment plan. She is on the Category 4 plan and is following her eating plan approximately 90 % of the time. She states she is exercising 0 minutes 0 times per week. Vanessa Christensen has lost 2 lbs (weight of 213 lbs). She has gone out but ordered from the "skinny meal".  We were unable to weigh the patient today for this TeleHealth visit. She feels as if she has lost 2 lbs since her last visit. She has lost 22 lbs since starting treatment with [name].  Insulin Resistance Vanessa Christensen has a diagnosis of insulin resistance based on her elevated fasting insulin level >5. Last A1c was 5.4 and insulin of 10.9. Although Vanessa Christensen's blood glucose readings are still under good control, insulin resistance puts her at greater risk of metabolic syndrome and diabetes. She is not taking metformin currently and denies polyphagia. She continues to work on diet and exercise to decrease risk of diabetes.  ASSESSMENT AND PLAN:  Diagnoses:   Insulin Resistance  Obesity Class 3   PLAN:  Insulin Resistance Vanessa Christensen will continue to work on weight loss, increase exercise, increase protein, and decreasing simple carbohydrates in her diet to help decrease the risk of diabetes. We dicussed metformin including benefits and risks. She was informed that eating too many simple carbohydrates or too many calories at one sitting increases the likelihood of GI side effects. Vanessa Christensen declined metformin for now and prescription was not written today. Vanessa Christensen agrees to follow up with our clinic in 2 weeks as directed to  monitor her progress.  Obesity Vanessa Christensen is currently in the action stage of change. As such, her goal is to continue with weight loss efforts She has agreed to follow the Category 4 plan Vanessa Christensen has been instructed to work up to a goal of 150 minutes of combined cardio and strengthening exercise per week or continue with exercise (outdoors), schedule exercise at the gym (training for walkathon) at 6 am, and add in resistance for weight loss and overall health benefits. We discussed the following Behavioral Modification Strategies today: increasing lean protein intake, decreasing simple carbohydrates, increasing vegetables, decrease eating out, work on meal planning and easy cooking plans, increase H20 intake, no skipping meals, keeping healthy foods in the home, and planning for success   Vanessa Christensen has agreed to follow up with our clinic in 2 weeks. She was informed of the importance of frequent follow up visits to maximize her success with intensive lifestyle modifications for her multiple health conditions.  ALLERGIES: Allergies  Allergen Reactions  . Neulasta [Pegfilgrastim] Other (See Comments)    Raised lumps on legs, arms - had to be excised =  Happened every time pt received Neulasta.    MEDICATIONS: Current Outpatient Medications on File Prior to Visit  Medication Sig Dispense Refill  . Ascorbic Acid (VITAMIN C ER PO) Take 1 tablet by mouth daily.     Marland Kitchen aspirin 81 MG tablet Take 81 mg by mouth daily.    . AZOPT 1 % ophthalmic suspension PLACE 1 DROP INTO BOTH EYES TWICE DAILY  12  . b complex vitamins  tablet Take 1 tablet by mouth daily.    Marland Kitchen escitalopram (LEXAPRO) 20 MG tablet TAKE 1 & 1/2 TABLETS BY MOUTH DAILY 135 tablet 3  . levothyroxine (SYNTHROID, LEVOTHROID) 75 MCG tablet TAKE 1 TABLET BY MOUTH EVERY DAY 90 tablet 1  . Multiple Vitamins-Minerals (MULTIVITAMIN ADULTS 50+ PO) Take 1 tablet by mouth daily.     . psyllium (METAMUCIL) 58.6 % powder Take 1 packet by mouth daily.      Marland Kitchen tolterodine (DETROL LA) 4 MG 24 hr capsule TAKE 1 CAPSULE BY MOUTH EVERY DAY 90 capsule 2  . Vitamin D, Ergocalciferol, (DRISDOL) 1.25 MG (50000 UT) CAPS capsule Take 1 capsule (50,000 Units total) by mouth every 7 (seven) days. 4 capsule 0   No current facility-administered medications on file prior to visit.     PAST MEDICAL HISTORY: Past Medical History:  Diagnosis Date  . Anxiety    pt denies  . Arthritis    hands and feet  . Cancer ([place]) 10/04/2006   breast  . Cataracts, both eyes   . Depression   . Depression   . Diverticulosis of colon   . Glaucoma   . Heart murmur   . Joint pain   . Knee pain   . Obesity   . OSA on CPAP   . Osteoarthritis   . Sleep apnea   . Thyroid activity decreased     PAST SURGICAL HISTORY: Past Surgical History:  Procedure Laterality Date  . BREAST SURGERY Bilateral   . CATARACT EXTRACTION    . GLAUCOMA REPAIR    . MASTECTOMY, RADICAL Bilateral   . neulasta induced sterile abscesses    . THYROIDECTOMY, PARTIAL    . TOTAL KNEE ARTHROPLASTY Right     SOCIAL HISTORY: Social History   Tobacco Use  . Smoking status: Former Smoker    Quit date: 11/03/1978    Years since quitting: 40.1  . Smokeless tobacco: Never Used  Substance Use Topics  . Alcohol use: No  . Drug use: No    FAMILY HISTORY: Family History  Problem Relation Age of Onset  . CVA Mother   . Cancer Mother 43       breast cancer   . Heart disease Mother   . Stroke Mother   . Obesity Mother   . Leukemia Father   . High blood pressure Father   . High Cholesterol Father   . Heart disease Father   . Cancer Maternal Aunt 55       breast cancer   . Cancer Maternal Aunt 55       breast cancer  . Cancer Cousin 32       breast cancer    ROS: Review of Systems  Constitutional: Positive for weight loss.  Endo/Heme/Allergies:       Negative polyphagia    PHYSICAL EXAM: Pt in no acute distress  RECENT LABS AND TESTS: BMET    Component Value Date/Time   NA  142 11/04/2018 0925   NA 141 07/13/2016 1015   K 4.8 11/04/2018 0925   K 4.3 07/13/2016 1015   CL 104 11/04/2018 0925   CO2 25 11/04/2018 0925   CO2 24 07/13/2016 1015   GLUCOSE 89 11/04/2018 0925   GLUCOSE 78 07/31/2018 1013   GLUCOSE 89 07/13/2016 1015   BUN 17 11/04/2018 0925   BUN 17.2 07/13/2016 1015   CREATININE 0.78 11/04/2018 0925   CREATININE 0.7 07/13/2016 1015   CALCIUM 9.9 11/04/2018 0925   CALCIUM  9.6 07/13/2016 1015   GFRNONAA 78 11/04/2018 0925   GFRAA 90 11/04/2018 0925   Lab Results  Component Value Date   HGBA1C 5.4 11/04/2018   HGBA1C 5.6 08/13/2018   Lab Results  Component Value Date   INSULIN 10.9 11/04/2018   INSULIN 13.2 08/13/2018   CBC    Component Value Date/Time   WBC 4.6 07/31/2018 1013   RBC 4.38 07/31/2018 1013   HGB 12.1 07/31/2018 1013   HGB 12.2 07/13/2016 1015   HCT 39.0 07/31/2018 1013   HCT 36.9 07/13/2016 1015   PLT 190 07/31/2018 1013   PLT 183 07/13/2016 1015   MCV 89.0 07/31/2018 1013   MCV 84.3 07/13/2016 1015   MCH 27.6 07/31/2018 1013   MCHC 31.0 07/31/2018 1013   RDW 14.2 07/31/2018 1013   RDW 15.2 (H) 07/13/2016 1015   LYMPHSABS 1.3 07/31/2018 1013   LYMPHSABS 1.3 07/13/2016 1015   MONOABS 0.5 07/31/2018 1013   MONOABS 0.5 07/13/2016 1015   EOSABS 0.0 07/31/2018 1013   EOSABS 0.0 07/13/2016 1015   BASOSABS 0.0 07/31/2018 1013   BASOSABS 0.0 07/13/2016 1015   Iron/TIBC/Ferritin/ %Sat No results found for: IRON, TIBC, FERRITIN, IRONPCTSAT Lipid Panel     Component Value Date/Time   CHOL 170 05/08/2018 0957   TRIG 114.0 05/08/2018 0957   HDL 43.40 05/08/2018 0957   CHOLHDL 4 05/08/2018 0957   VLDL 22.8 05/08/2018 0957   LDLCALC 103 (H) 05/08/2018 0957   Hepatic Function Panel     Component Value Date/Time   PROT 6.5 11/04/2018 0925   PROT 6.7 07/13/2016 1015   ALBUMIN 4.4 11/04/2018 0925   ALBUMIN 4.0 07/13/2016 1015   AST 30 11/04/2018 0925   AST 22 07/13/2016 1015   ALT 35 (H) 11/04/2018 0925    ALT 28 07/13/2016 1015   ALKPHOS 94 11/04/2018 0925   ALKPHOS 70 07/13/2016 1015   BILITOT 0.4 11/04/2018 0925   BILITOT 0.57 07/13/2016 1015   BILIDIR 0.1 05/08/2018 0957      Component Value Date/Time   TSH 1.05 05/08/2018 0957   TSH 1.32 11/07/2017 0921   TSH 0.86 05/09/2017 1006      I, Trixie Dredge, am acting as Location manager for [employer], DO  I have reviewed the above documentation for accuracy and completeness, and I agree with the above. Jearld Lesch, DO

## 2018-12-24 ENCOUNTER — Other Ambulatory Visit: Payer: Self-pay

## 2018-12-24 ENCOUNTER — Telehealth (INDEPENDENT_AMBULATORY_CARE_PROVIDER_SITE_OTHER): Payer: Medicare Other | Admitting: Bariatrics

## 2018-12-24 ENCOUNTER — Encounter (INDEPENDENT_AMBULATORY_CARE_PROVIDER_SITE_OTHER): Payer: Self-pay | Admitting: Bariatrics

## 2018-12-24 DIAGNOSIS — E8881 Metabolic syndrome: Secondary | ICD-10-CM

## 2018-12-24 DIAGNOSIS — Z6841 Body Mass Index (BMI) 40.0 and over, adult: Secondary | ICD-10-CM | POA: Diagnosis not present

## 2018-12-24 DIAGNOSIS — E559 Vitamin D deficiency, unspecified: Secondary | ICD-10-CM

## 2018-12-25 NOTE — Progress Notes (Signed)
Office: [phone]  /  Fax: [phone] TeleHealth Visit:  Vanessa Christensen has verbally consented to this TeleHealth visit today. The patient is located at home, the provider is located at the [facility] office. The participants in this visit include the listed provider and patient. The visit was conducted today via skype.  HPI:   Chief Complaint: OBESITY Vanessa Christensen is here to discuss her progress with her obesity treatment plan. She is on the Category 4 plan and is following her eating plan approximately 85 % of the time. She states she is doing cardio for 60 minutes 3 times per week. Vanessa Christensen states that she has stayed the same weight (weight of 213 lbs). She is doing adequate water. She was so steadfast in the beginning, but now making concessions.  We were unable to weigh the patient today for this TeleHealth visit. She feels as if she has maintained her weight since her last visit. She has lost 22 lbs since starting treatment with [name].  Insulin Resistance Vanessa Christensen has a diagnosis of insulin resistance based on her elevated fasting insulin level >5. Last A1c was of 5.4 and insulin of 10.9. Although Vanessa Christensen's blood glucose readings are still under good control, insulin resistance puts her at greater risk of metabolic syndrome and diabetes. She is not on any medications and continues to work on diet and exercise to decrease risk of diabetes.  Vitamin D Deficiency Vanessa Christensen has a diagnosis of vitamin D deficiency. She is currently taking prescription Vit D. Last Vit D level was 42.4. She denies nausea, vomiting or muscle weakness.  ASSESSMENT AND PLAN:  Insulin resistance  Vitamin D deficiency  Class 3 severe obesity with serious comorbidity and body mass index (BMI) of 40.0 to 44.9 in adult, unspecified obesity type ([place])  PLAN:  Insulin Resistance Vanessa Christensen will continue to work on weight loss, exercise, increase activities, increase protein, and decreasing simple carbohydrates in  her diet to help decrease the risk of diabetes. We dicussed metformin including benefits and risks. She was informed that eating too many simple carbohydrates or too many calories at one sitting increases the likelihood of GI side effects. Vanessa Christensen declined metformin for now and prescription was not written today. Vanessa Christensen agrees to follow up with our clinic in 2 weeks as directed to monitor her progress.  Vitamin D Deficiency Vanessa Christensen was informed that low vitamin D levels contributes to fatigue and are associated with obesity, breast, and colon cancer. Vanessa Christensen agrees to continue taking prescription Vit D 50,000 IU every week and will follow up for routine testing of vitamin D, at least 2-3 times per year. She was informed of the risk of over-replacement of vitamin D and agrees to not increase her dose unless she discusses this with [name] first. Vanessa Christensen agrees to follow up with our clinic in 2 weeks.  Obesity Vanessa Christensen is currently in the action stage of change. As such, her goal is to continue with weight loss efforts She has agreed to keep a food journal with 1700 calories and 90 grams of protein daily Vanessa Christensen has been instructed to work up to a goal of 150 minutes of combined cardio and strengthening exercise per week or exercise 3 times a week for 45 minutes for weight loss and overall health benefits. We discussed the following Behavioral Modification Strategies today: increasing lean protein intake, decreasing simple carbohydrates, increasing vegetables, decrease eating out, increase H20 intake, work on meal planning and easy cooking plans, no skipping meals, and keeping healthy foods in  the home   Vanessa Christensen has agreed to follow up with our clinic in 2 weeks. She was informed of the importance of frequent follow up visits to maximize her success with intensive lifestyle modifications for her multiple health conditions.  ALLERGIES: Allergies  Allergen Reactions  . Neulasta [Pegfilgrastim] Other (See Comments)     Raised lumps on legs, arms - had to be excised =  Happened every time pt received Neulasta.    MEDICATIONS: Current Outpatient Medications on File Prior to Visit  Medication Sig Dispense Refill  . Ascorbic Acid (VITAMIN C ER PO) Take 1 tablet by mouth daily.     Marland Kitchen aspirin 81 MG tablet Take 81 mg by mouth daily.    . AZOPT 1 % ophthalmic suspension PLACE 1 DROP INTO BOTH EYES TWICE DAILY  12  . b complex vitamins tablet Take 1 tablet by mouth daily.    Marland Kitchen escitalopram (LEXAPRO) 20 MG tablet TAKE 1 & 1/2 TABLETS BY MOUTH DAILY 135 tablet 3  . levothyroxine (SYNTHROID, LEVOTHROID) 75 MCG tablet TAKE 1 TABLET BY MOUTH EVERY DAY 90 tablet 1  . Multiple Vitamins-Minerals (MULTIVITAMIN ADULTS 50+ PO) Take 1 tablet by mouth daily.     . psyllium (METAMUCIL) 58.6 % powder Take 1 packet by mouth daily.     Marland Kitchen tolterodine (DETROL LA) 4 MG 24 hr capsule TAKE 1 CAPSULE BY MOUTH EVERY DAY 90 capsule 2  . Vitamin D, Ergocalciferol, (DRISDOL) 1.25 MG (50000 UT) CAPS capsule Take 1 capsule (50,000 Units total) by mouth every 7 (seven) days. 4 capsule 0   No current facility-administered medications on file prior to visit.     PAST MEDICAL HISTORY: Past Medical History:  Diagnosis Date  . Anxiety    pt denies  . Arthritis    hands and feet  . Cancer ([place]) 10/04/2006   breast  . Cataracts, both eyes   . Depression   . Depression   . Diverticulosis of colon   . Glaucoma   . Heart murmur   . Joint pain   . Knee pain   . Obesity   . OSA on CPAP   . Osteoarthritis   . Sleep apnea   . Thyroid activity decreased     PAST SURGICAL HISTORY: Past Surgical History:  Procedure Laterality Date  . BREAST SURGERY Bilateral   . CATARACT EXTRACTION    . GLAUCOMA REPAIR    . MASTECTOMY, RADICAL Bilateral   . neulasta induced sterile abscesses    . THYROIDECTOMY, PARTIAL    . TOTAL KNEE ARTHROPLASTY Right     SOCIAL HISTORY: Social History   Tobacco Use  . Smoking status: Former Smoker    Quit  date: 11/03/1978    Years since quitting: 40.1  . Smokeless tobacco: Never Used  Substance Use Topics  . Alcohol use: No  . Drug use: No    FAMILY HISTORY: Family History  Problem Relation Age of Onset  . CVA Mother   . Cancer Mother 59       breast cancer   . Heart disease Mother   . Stroke Mother   . Obesity Mother   . Leukemia Father   . High blood pressure Father   . High Cholesterol Father   . Heart disease Father   . Cancer Maternal Aunt 55       breast cancer   . Cancer Maternal Aunt 55       breast cancer  . Cancer Cousin 32  breast cancer    ROS: Review of Systems  Constitutional: Negative for weight loss.  Gastrointestinal: Negative for nausea and vomiting.  Musculoskeletal:       Negative muscle weakness    PHYSICAL EXAM: Pt in no acute distress  RECENT LABS AND TESTS: BMET    Component Value Date/Time   NA 142 11/04/2018 0925   NA 141 07/13/2016 1015   K 4.8 11/04/2018 0925   K 4.3 07/13/2016 1015   CL 104 11/04/2018 0925   CO2 25 11/04/2018 0925   CO2 24 07/13/2016 1015   GLUCOSE 89 11/04/2018 0925   GLUCOSE 78 07/31/2018 1013   GLUCOSE 89 07/13/2016 1015   BUN 17 11/04/2018 0925   BUN 17.2 07/13/2016 1015   CREATININE 0.78 11/04/2018 0925   CREATININE 0.7 07/13/2016 1015   CALCIUM 9.9 11/04/2018 0925   CALCIUM 9.6 07/13/2016 1015   GFRNONAA 78 11/04/2018 0925   GFRAA 90 11/04/2018 0925   Lab Results  Component Value Date   HGBA1C 5.4 11/04/2018   HGBA1C 5.6 08/13/2018   Lab Results  Component Value Date   INSULIN 10.9 11/04/2018   INSULIN 13.2 08/13/2018   CBC    Component Value Date/Time   WBC 4.6 07/31/2018 1013   RBC 4.38 07/31/2018 1013   HGB 12.1 07/31/2018 1013   HGB 12.2 07/13/2016 1015   HCT 39.0 07/31/2018 1013   HCT 36.9 07/13/2016 1015   PLT 190 07/31/2018 1013   PLT 183 07/13/2016 1015   MCV 89.0 07/31/2018 1013   MCV 84.3 07/13/2016 1015   MCH 27.6 07/31/2018 1013   MCHC 31.0 07/31/2018 1013   RDW  14.2 07/31/2018 1013   RDW 15.2 (H) 07/13/2016 1015   LYMPHSABS 1.3 07/31/2018 1013   LYMPHSABS 1.3 07/13/2016 1015   MONOABS 0.5 07/31/2018 1013   MONOABS 0.5 07/13/2016 1015   EOSABS 0.0 07/31/2018 1013   EOSABS 0.0 07/13/2016 1015   BASOSABS 0.0 07/31/2018 1013   BASOSABS 0.0 07/13/2016 1015   Iron/TIBC/Ferritin/ %Sat No results found for: IRON, TIBC, FERRITIN, IRONPCTSAT Lipid Panel     Component Value Date/Time   CHOL 170 05/08/2018 0957   TRIG 114.0 05/08/2018 0957   HDL 43.40 05/08/2018 0957   CHOLHDL 4 05/08/2018 0957   VLDL 22.8 05/08/2018 0957   LDLCALC 103 (H) 05/08/2018 0957   Hepatic Function Panel     Component Value Date/Time   PROT 6.5 11/04/2018 0925   PROT 6.7 07/13/2016 1015   ALBUMIN 4.4 11/04/2018 0925   ALBUMIN 4.0 07/13/2016 1015   AST 30 11/04/2018 0925   AST 22 07/13/2016 1015   ALT 35 (H) 11/04/2018 0925   ALT 28 07/13/2016 1015   ALKPHOS 94 11/04/2018 0925   ALKPHOS 70 07/13/2016 1015   BILITOT 0.4 11/04/2018 0925   BILITOT 0.57 07/13/2016 1015   BILIDIR 0.1 05/08/2018 0957      Component Value Date/Time   TSH 1.05 05/08/2018 0957   TSH 1.32 11/07/2017 0921   TSH 0.86 05/09/2017 1006      I, Trixie Dredge, am acting as Location manager for [employer], DO  I have reviewed the above documentation for accuracy and completeness, and I agree with the above. Jearld Lesch, DO

## 2019-01-07 ENCOUNTER — Telehealth (INDEPENDENT_AMBULATORY_CARE_PROVIDER_SITE_OTHER): Payer: Medicare Other | Admitting: Bariatrics

## 2019-01-07 ENCOUNTER — Other Ambulatory Visit: Payer: Self-pay

## 2019-01-07 ENCOUNTER — Encounter (INDEPENDENT_AMBULATORY_CARE_PROVIDER_SITE_OTHER): Payer: Self-pay | Admitting: Bariatrics

## 2019-01-07 DIAGNOSIS — Z6841 Body Mass Index (BMI) 40.0 and over, adult: Secondary | ICD-10-CM

## 2019-01-07 DIAGNOSIS — E8881 Metabolic syndrome: Secondary | ICD-10-CM | POA: Diagnosis not present

## 2019-01-07 DIAGNOSIS — E559 Vitamin D deficiency, unspecified: Secondary | ICD-10-CM

## 2019-01-07 NOTE — Progress Notes (Signed)
Office: [phone]  /  Fax: [phone] TeleHealth Visit:  Vanessa Christensen has verbally consented to this TeleHealth visit today. The patient is located at home, the provider is located at the [facility] office. The participants in this visit include the listed provider and patient. The visit was conducted today via Skype.  HPI:   Chief Complaint: OBESITY Vanessa Christensen is here to discuss her progress with her obesity treatment plan. She is on he Category 4 plan and is following her eating plan approximately 80% of the time. She states she is doing cardio 60 minutes 3 times per week. Vanessa Christensen states that she is down 3 lbs and she continues to exercise but reports it was harder to stay on the diet. She will be going to a retreat once a week.  We were unable to weigh the patient today for this TeleHealth visit. She feels as if she has lost 3 lbs since her last visit. She has lost 0 lbs since starting treatment with [name].  Insulin Resistance Vanessa Christensen has a diagnosis of insulin resistance based on her elevated fasting insulin level >5. Her last A1c was 5.4 on 11/04/2018 and her insulin was 10.9. Although Vanessa Christensen's blood glucose readings are still under good control, insulin resistance puts her at greater risk of metabolic syndrome and diabetes. She is on no medication currently and continues to work on diet and exercise to decrease risk of diabetes.  Vitamin D deficiency Vanessa Christensen has a diagnosis of Vitamin D deficiency. Her last Vitamin D was 42.4 on 11/04/2018. She is currently taking Vit D and denies nausea, vomiting or muscle weakness.  ASSESSMENT AND PLAN:  Vitamin D deficiency  Insulin resistance  Class 3 severe obesity with serious comorbidity and body mass index (BMI) of 40.0 to 44.9 in adult, unspecified obesity type ([place])  PLAN:  Insulin Resistance Vanessa Christensen will continue to work on weight loss, exercise, and decreasing simple carbohydrates in her diet to help decrease the risk of  diabetes. We dicussed metformin including benefits and risks. She was informed that eating too many simple carbohydrates or too many calories at one sitting increases the likelihood of GI side effects. Dailey was advised to decrease carbohydrates, increase protein, and continue her exercise regimen. She will follow-up with [name] as directed to monitor her progress.  Vitamin D Deficiency Vanessa Christensen was informed that low Vitamin D levels contributes to fatigue and are associated with obesity, breast, and colon cancer. She agrees to continue taking Vit D and will follow-up for routine testing of Vitamin D, at least 2-3 times per year. She was informed of the risk of over-replacement of Vitamin D and agrees to not increase her dose unless she discusses this with [name] first. Vanessa Christensen agrees to follow-up with our clinic in 2 weeks.  Obesity Vanessa Christensen is currently in the action stage of change. As such, her goal is to continue with weight loss efforts. She has agreed to follow the Category 4 plan. Vanessa Christensen will work on meal planning, intentional eating, and will watch for mindless eating (avoid). Vanessa Christensen has been instructed to work up to a goal of 150 minutes of combined cardio and strengthening exercise per week for weight loss and overall health benefits. We discussed the following Behavioral Modification Strategies today: increasing lean protein intake, decreasing simple carbohydrates, increasing vegetables, increase H20 intake, decrease eating out, no skipping meals, work on meal planning and easy cooking plans, and keeping healthy foods in the home.   Vanessa Christensen has agreed to follow-up with our  clinic in 2 weeks. She was informed of the importance of frequent follow-up visits to maximize her success with intensive lifestyle modifications for her multiple health conditions.  ALLERGIES: Allergies  Allergen Reactions  . Neulasta [Pegfilgrastim] Other (See Comments)    Raised lumps on legs, arms - had to be excised =   Happened every time pt received Neulasta.    MEDICATIONS: Current Outpatient Medications on File Prior to Visit  Medication Sig Dispense Refill  . Ascorbic Acid (VITAMIN C ER PO) Take 1 tablet by mouth daily.     Marland Kitchen aspirin 81 MG tablet Take 81 mg by mouth daily.    . AZOPT 1 % ophthalmic suspension PLACE 1 DROP INTO BOTH EYES TWICE DAILY  12  . b complex vitamins tablet Take 1 tablet by mouth daily.    Marland Kitchen escitalopram (LEXAPRO) 20 MG tablet TAKE 1 & 1/2 TABLETS BY MOUTH DAILY 135 tablet 3  . levothyroxine (SYNTHROID, LEVOTHROID) 75 MCG tablet TAKE 1 TABLET BY MOUTH EVERY DAY 90 tablet 1  . Multiple Vitamins-Minerals (MULTIVITAMIN ADULTS 50+ PO) Take 1 tablet by mouth daily.     . psyllium (METAMUCIL) 58.6 % powder Take 1 packet by mouth daily.     Marland Kitchen tolterodine (DETROL LA) 4 MG 24 hr capsule TAKE 1 CAPSULE BY MOUTH EVERY DAY 90 capsule 2  . Vitamin D, Ergocalciferol, (DRISDOL) 1.25 MG (50000 UT) CAPS capsule Take 1 capsule (50,000 Units total) by mouth every 7 (seven) days. 4 capsule 0   No current facility-administered medications on file prior to visit.     PAST MEDICAL HISTORY: Past Medical History:  Diagnosis Date  . Anxiety    pt denies  . Arthritis    hands and feet  . Cancer ([place]) 10/04/2006   breast  . Cataracts, both eyes   . Depression   . Depression   . Diverticulosis of colon   . Glaucoma   . Heart murmur   . Joint pain   . Knee pain   . Obesity   . OSA on CPAP   . Osteoarthritis   . Sleep apnea   . Thyroid activity decreased     PAST SURGICAL HISTORY: Past Surgical History:  Procedure Laterality Date  . BREAST SURGERY Bilateral   . CATARACT EXTRACTION    . GLAUCOMA REPAIR    . MASTECTOMY, RADICAL Bilateral   . neulasta induced sterile abscesses    . THYROIDECTOMY, PARTIAL    . TOTAL KNEE ARTHROPLASTY Right     SOCIAL HISTORY: Social History   Tobacco Use  . Smoking status: Former Smoker    Quit date: 11/03/1978    Years since quitting: 40.2  .  Smokeless tobacco: Never Used  Substance Use Topics  . Alcohol use: No  . Drug use: No    FAMILY HISTORY: Family History  Problem Relation Age of Onset  . CVA Mother   . Cancer Mother 65       breast cancer   . Heart disease Mother   . Stroke Mother   . Obesity Mother   . Leukemia Father   . High blood pressure Father   . High Cholesterol Father   . Heart disease Father   . Cancer Maternal Aunt 55       breast cancer   . Cancer Maternal Aunt 55       breast cancer  . Cancer Cousin 32       breast cancer   ROS: Review of Systems  Gastrointestinal: Negative for nausea and vomiting.  Musculoskeletal:       Negative for muscle weakness.   PHYSICAL EXAM: Pt in no acute distress  RECENT LABS AND TESTS: BMET    Component Value Date/Time   NA 142 11/04/2018 0925   NA 141 07/13/2016 1015   K 4.8 11/04/2018 0925   K 4.3 07/13/2016 1015   CL 104 11/04/2018 0925   CO2 25 11/04/2018 0925   CO2 24 07/13/2016 1015   GLUCOSE 89 11/04/2018 0925   GLUCOSE 78 07/31/2018 1013   GLUCOSE 89 07/13/2016 1015   BUN 17 11/04/2018 0925   BUN 17.2 07/13/2016 1015   CREATININE 0.78 11/04/2018 0925   CREATININE 0.7 07/13/2016 1015   CALCIUM 9.9 11/04/2018 0925   CALCIUM 9.6 07/13/2016 1015   GFRNONAA 78 11/04/2018 0925   GFRAA 90 11/04/2018 0925   Lab Results  Component Value Date   HGBA1C 5.4 11/04/2018   HGBA1C 5.6 08/13/2018   Lab Results  Component Value Date   INSULIN 10.9 11/04/2018   INSULIN 13.2 08/13/2018   CBC    Component Value Date/Time   WBC 4.6 07/31/2018 1013   RBC 4.38 07/31/2018 1013   HGB 12.1 07/31/2018 1013   HGB 12.2 07/13/2016 1015   HCT 39.0 07/31/2018 1013   HCT 36.9 07/13/2016 1015   PLT 190 07/31/2018 1013   PLT 183 07/13/2016 1015   MCV 89.0 07/31/2018 1013   MCV 84.3 07/13/2016 1015   MCH 27.6 07/31/2018 1013   MCHC 31.0 07/31/2018 1013   RDW 14.2 07/31/2018 1013   RDW 15.2 (H) 07/13/2016 1015   LYMPHSABS 1.3 07/31/2018 1013    LYMPHSABS 1.3 07/13/2016 1015   MONOABS 0.5 07/31/2018 1013   MONOABS 0.5 07/13/2016 1015   EOSABS 0.0 07/31/2018 1013   EOSABS 0.0 07/13/2016 1015   BASOSABS 0.0 07/31/2018 1013   BASOSABS 0.0 07/13/2016 1015   Iron/TIBC/Ferritin/ %Sat No results found for: IRON, TIBC, FERRITIN, IRONPCTSAT Lipid Panel     Component Value Date/Time   CHOL 170 05/08/2018 0957   TRIG 114.0 05/08/2018 0957   HDL 43.40 05/08/2018 0957   CHOLHDL 4 05/08/2018 0957   VLDL 22.8 05/08/2018 0957   LDLCALC 103 (H) 05/08/2018 0957   Hepatic Function Panel     Component Value Date/Time   PROT 6.5 11/04/2018 0925   PROT 6.7 07/13/2016 1015   ALBUMIN 4.4 11/04/2018 0925   ALBUMIN 4.0 07/13/2016 1015   AST 30 11/04/2018 0925   AST 22 07/13/2016 1015   ALT 35 (H) 11/04/2018 0925   ALT 28 07/13/2016 1015   ALKPHOS 94 11/04/2018 0925   ALKPHOS 70 07/13/2016 1015   BILITOT 0.4 11/04/2018 0925   BILITOT 0.57 07/13/2016 1015   BILIDIR 0.1 05/08/2018 0957      Component Value Date/Time   TSH 1.05 05/08/2018 0957   TSH 1.32 11/07/2017 0921   TSH 0.86 05/09/2017 1006   Results for DYNEISHA, MURCHISON (MRN [id number]) as of 01/07/2019 14:16  Ref. Range 11/04/2018 09:25  Vitamin D, 25-Hydroxy Latest Ref Range: 30.0 - 100.0 ng/mL 42.4    I, Michaelene Song, am acting as Location manager for [employer], DO  I have reviewed the above documentation for accuracy and completeness, and I agree with the above. -Jearld Lesch, DO

## 2019-01-25 ENCOUNTER — Other Ambulatory Visit: Payer: Self-pay | Admitting: Family Medicine

## 2019-01-28 ENCOUNTER — Encounter (INDEPENDENT_AMBULATORY_CARE_PROVIDER_SITE_OTHER): Payer: Self-pay | Admitting: Bariatrics

## 2019-01-28 ENCOUNTER — Other Ambulatory Visit: Payer: Self-pay

## 2019-01-28 ENCOUNTER — Telehealth (INDEPENDENT_AMBULATORY_CARE_PROVIDER_SITE_OTHER): Payer: Medicare Other | Admitting: Bariatrics

## 2019-01-28 DIAGNOSIS — Z6841 Body Mass Index (BMI) 40.0 and over, adult: Secondary | ICD-10-CM | POA: Diagnosis not present

## 2019-01-28 DIAGNOSIS — E8881 Metabolic syndrome: Secondary | ICD-10-CM

## 2019-01-28 DIAGNOSIS — E559 Vitamin D deficiency, unspecified: Secondary | ICD-10-CM

## 2019-01-28 MED ORDER — VITAMIN D (ERGOCALCIFEROL) 1.25 MG (50000 UNIT) PO CAPS: 50000.0000 [IU] | ORAL_CAPSULE | ORAL | 0 refills | Status: DC

## 2019-01-28 NOTE — Progress Notes (Signed)
Office: [phone]  /  Fax: [phone] TeleHealth Visit:  Vanessa Christensen has verbally consented to this TeleHealth visit today. The patient is located at home, the provider is located at the [facility] office. The participants in this visit include the listed provider and patient. The visit was conducted today via Skype.  HPI:   Chief Complaint: OBESITY Vanessa Christensen is here to discuss her progress with her obesity treatment plan. She is on the Category 4 plan and is following her eating plan approximately 0 % of the time. She states she is exercising 0 minutes 0 times per week. Vanessa Christensen states that she has lost 3 pounds, but attributes that to the oral surgery (1 graft done) that she had 8 days ago. Her appetite is decreased.  We were unable to weigh the patient today for this TeleHealth visit. She feels as if she has lost weight since her last visit. She has lost 0 lbs since starting treatment with [name].  Vitamin D Deficiency Vanessa Christensen has a diagnosis of vitamin D deficiency. She is currently on vit D. Vanessa Christensen denies nausea, vomiting, or muscle weakness.  Insulin Resistance (Mild) Vanessa Christensen has a diagnosis of insulin resistance based on her elevated fasting insulin level of 10.9 and A1c of 5.4 on 11/04/18. Although Vanessa Christensen's blood glucose readings are still under good control, insulin resistance puts her at greater risk of metabolic syndrome and diabetes. She is not taking metformin currently and continues to work on diet and exercise to decrease risk of diabetes.  ASSESSMENT AND PLAN:  Vitamin D deficiency - Plan: Vitamin D, Ergocalciferol, (DRISDOL) 1.25 MG (50000 UT) CAPS capsule  Insulin resistance  Class 3 severe obesity with serious comorbidity and body mass index (BMI) of 40.0 to 44.9 in adult, unspecified obesity type ([place])  PLAN:  Vitamin D Deficiency Vanessa Christensen was informed that low vitamin D levels contribute to fatigue and are associated with obesity, breast, and colon cancer.  Vanessa Christensen agrees to continue to take prescription Vit D @50 ,000 IU every week #4 with no refills and will follow up for routine testing of vitamin D, at least 2-3 times per year. She was informed of the risk of over-replacement of vitamin D and agrees to not increase her dose unless she discusses this with [name] first. Vanessa Christensen agrees to follow up in 3 to 4 weeks as directed.  Insulin Resistance (Mild) Vanessa Christensen will continue to work on weight loss, exercise, and decreasing simple carbohydrates in her diet to help decrease the risk of diabetes. She was informed that eating too many simple carbohydrates or too many calories at one sitting increases the likelihood of GI side effects. Vanessa Christensen agreed to decrease carbohydrates and increase protein. She will return to activity when ready and Vanessa Christensen agreed to follow up with [name] as directed to monitor her progress.   Obesity Vanessa Christensen is currently in the action stage of change. As such, her goal is to continue with weight loss efforts. She has agreed to follow the Category 4 plan with meal planning and intentional eating. She agrees to remain on a soft diet until she sees the periodontist. Vanessa Christensen has been normally exercising, but has not been able to due to surgery.  We discussed the following Behavioral Modification Strategies today: increasing lean protein intake, decreasing simple carbohydrates, increasing vegetables, increase H2O intake, no skipping meals, keeping healthy foods in the home, planning for success, decrease eating out, and work on meal planning and easy cooking plans.  Vanessa Christensen has agreed to follow up with  our clinic in 3 to 4 weeks. She was informed of the importance of frequent follow up visits to maximize her success with intensive lifestyle modifications for her multiple health conditions.  ALLERGIES: Allergies  Allergen Reactions  . Neulasta [Pegfilgrastim] Other (See Comments)    Raised lumps on legs, arms - had to be excised =  Happened every time  pt received Neulasta.    MEDICATIONS: Current Outpatient Medications on File Prior to Visit  Medication Sig Dispense Refill  . acetaminophen (TYLENOL) 325 MG tablet Take 650 mg by mouth every 6 (six) hours as needed.    . Ascorbic Acid (VITAMIN C ER PO) Take 1 tablet by mouth daily.     Marland Kitchen aspirin 81 MG tablet Take 81 mg by mouth daily.    . AZOPT 1 % ophthalmic suspension PLACE 1 DROP INTO BOTH EYES TWICE DAILY  12  . b complex vitamins tablet Take 1 tablet by mouth daily.    Marland Kitchen escitalopram (LEXAPRO) 20 MG tablet TAKE 1 & 1/2 TABLETS BY MOUTH DAILY 135 tablet 3  . levothyroxine (SYNTHROID) 75 MCG tablet TAKE 1 TABLET BY MOUTH EVERY DAY 90 tablet 1  . Multiple Vitamins-Minerals (MULTIVITAMIN ADULTS 50+ PO) Take 1 tablet by mouth daily.     . psyllium (METAMUCIL) 58.6 % powder Take 1 packet by mouth daily.     Marland Kitchen tolterodine (DETROL LA) 4 MG 24 hr capsule TAKE 1 CAPSULE BY MOUTH EVERY DAY 90 capsule 2   No current facility-administered medications on file prior to visit.     PAST MEDICAL HISTORY: Past Medical History:  Diagnosis Date  . Anxiety    pt denies  . Arthritis    hands and feet  . Cancer ([place]) 10/04/2006   breast  . Cataracts, both eyes   . Depression   . Depression   . Diverticulosis of colon   . Glaucoma   . Heart murmur   . Joint pain   . Knee pain   . Obesity   . OSA on CPAP   . Osteoarthritis   . Sleep apnea   . Thyroid activity decreased     PAST SURGICAL HISTORY: Past Surgical History:  Procedure Laterality Date  . BREAST SURGERY Bilateral   . CATARACT EXTRACTION    . GLAUCOMA REPAIR    . MASTECTOMY, RADICAL Bilateral   . neulasta induced sterile abscesses    . THYROIDECTOMY, PARTIAL    . TOTAL KNEE ARTHROPLASTY Right     SOCIAL HISTORY: Social History   Tobacco Use  . Smoking status: Former Smoker    Quit date: 11/03/1978    Years since quitting: 40.2  . Smokeless tobacco: Never Used  Substance Use Topics  . Alcohol use: No  . Drug use: No     FAMILY HISTORY: Family History  Problem Relation Age of Onset  . CVA Mother   . Cancer Mother 64       breast cancer   . Heart disease Mother   . Stroke Mother   . Obesity Mother   . Leukemia Father   . High blood pressure Father   . High Cholesterol Father   . Heart disease Father   . Cancer Maternal Aunt 55       breast cancer   . Cancer Maternal Aunt 55       breast cancer  . Cancer Cousin 32       breast cancer    ROS: Review of Systems  Gastrointestinal: Negative for  nausea and vomiting.  Musculoskeletal:       Negative for muscle weakness.    PHYSICAL EXAM: Pt in no acute distress  RECENT LABS AND TESTS: BMET    Component Value Date/Time   NA 142 11/04/2018 0925   NA 141 07/13/2016 1015   K 4.8 11/04/2018 0925   K 4.3 07/13/2016 1015   CL 104 11/04/2018 0925   CO2 25 11/04/2018 0925   CO2 24 07/13/2016 1015   GLUCOSE 89 11/04/2018 0925   GLUCOSE 78 07/31/2018 1013   GLUCOSE 89 07/13/2016 1015   BUN 17 11/04/2018 0925   BUN 17.2 07/13/2016 1015   CREATININE 0.78 11/04/2018 0925   CREATININE 0.7 07/13/2016 1015   CALCIUM 9.9 11/04/2018 0925   CALCIUM 9.6 07/13/2016 1015   GFRNONAA 78 11/04/2018 0925   GFRAA 90 11/04/2018 0925   Lab Results  Component Value Date   HGBA1C 5.4 11/04/2018   HGBA1C 5.6 08/13/2018   Lab Results  Component Value Date   INSULIN 10.9 11/04/2018   INSULIN 13.2 08/13/2018   CBC    Component Value Date/Time   WBC 4.6 07/31/2018 1013   RBC 4.38 07/31/2018 1013   HGB 12.1 07/31/2018 1013   HGB 12.2 07/13/2016 1015   HCT 39.0 07/31/2018 1013   HCT 36.9 07/13/2016 1015   PLT 190 07/31/2018 1013   PLT 183 07/13/2016 1015   MCV 89.0 07/31/2018 1013   MCV 84.3 07/13/2016 1015   MCH 27.6 07/31/2018 1013   MCHC 31.0 07/31/2018 1013   RDW 14.2 07/31/2018 1013   RDW 15.2 (H) 07/13/2016 1015   LYMPHSABS 1.3 07/31/2018 1013   LYMPHSABS 1.3 07/13/2016 1015   MONOABS 0.5 07/31/2018 1013   MONOABS 0.5 07/13/2016 1015    EOSABS 0.0 07/31/2018 1013   EOSABS 0.0 07/13/2016 1015   BASOSABS 0.0 07/31/2018 1013   BASOSABS 0.0 07/13/2016 1015   Iron/TIBC/Ferritin/ %Sat No results found for: IRON, TIBC, FERRITIN, IRONPCTSAT Lipid Panel     Component Value Date/Time   CHOL 170 05/08/2018 0957   TRIG 114.0 05/08/2018 0957   HDL 43.40 05/08/2018 0957   CHOLHDL 4 05/08/2018 0957   VLDL 22.8 05/08/2018 0957   LDLCALC 103 (H) 05/08/2018 0957   Hepatic Function Panel     Component Value Date/Time   PROT 6.5 11/04/2018 0925   PROT 6.7 07/13/2016 1015   ALBUMIN 4.4 11/04/2018 0925   ALBUMIN 4.0 07/13/2016 1015   AST 30 11/04/2018 0925   AST 22 07/13/2016 1015   ALT 35 (H) 11/04/2018 0925   ALT 28 07/13/2016 1015   ALKPHOS 94 11/04/2018 0925   ALKPHOS 70 07/13/2016 1015   BILITOT 0.4 11/04/2018 0925   BILITOT 0.57 07/13/2016 1015   BILIDIR 0.1 05/08/2018 0957      Component Value Date/Time   TSH 1.05 05/08/2018 0957   TSH 1.32 11/07/2017 0921   TSH 0.86 05/09/2017 1006    Results for MARC, LEIDIG (MRN [id number]) as of 01/28/2019 11:58  Ref. Range 11/04/2018 09:25  Vitamin D, 25-Hydroxy Latest Ref Range: 30.0 - 100.0 ng/mL 42.4    I, [name] Blanco, CMA, am acting as Location manager for [employer]. [name], DO   I have reviewed the above documentation for accuracy and completeness, and I agree with the above. -Jearld Lesch, DO

## 2019-02-07 ENCOUNTER — Ambulatory Visit
Admission: RE | Admit: 2019-02-07 | Discharge: 2019-02-07 | Disposition: A | Discharge status: Home / Self Care | Payer: Medicare Other | Source: Ambulatory Visit | Attending: Hematology | Admitting: Hematology

## 2019-02-07 ENCOUNTER — Other Ambulatory Visit: Payer: Self-pay

## 2019-02-07 DIAGNOSIS — Z780 Asymptomatic menopausal state: Secondary | ICD-10-CM | POA: Diagnosis not present

## 2019-02-07 DIAGNOSIS — Z13820 Encounter for screening for osteoporosis: Secondary | ICD-10-CM | POA: Diagnosis not present

## 2019-02-07 DIAGNOSIS — E2839 Other primary ovarian failure: Secondary | ICD-10-CM

## 2019-02-11 ENCOUNTER — Telehealth: Payer: Self-pay

## 2019-02-11 NOTE — Telephone Encounter (Signed)
Left voice message for patient regarding Bone Density scan results, per Dr. Burr Medico  DEXA was normal, no concerns.

## 2019-02-11 NOTE — Telephone Encounter (Signed)
-----   Message from Truitt Merle, MD sent at 02/08/2019 10:52 AM EDT ----- Please let her know DEXA was normal, thanks  Truitt Merle  02/08/2019

## 2019-02-18 ENCOUNTER — Telehealth (INDEPENDENT_AMBULATORY_CARE_PROVIDER_SITE_OTHER): Payer: Medicare Other | Admitting: Bariatrics

## 2019-02-18 ENCOUNTER — Other Ambulatory Visit: Payer: Self-pay

## 2019-02-18 ENCOUNTER — Encounter (INDEPENDENT_AMBULATORY_CARE_PROVIDER_SITE_OTHER): Payer: Self-pay | Admitting: Bariatrics

## 2019-02-18 DIAGNOSIS — E559 Vitamin D deficiency, unspecified: Secondary | ICD-10-CM | POA: Diagnosis not present

## 2019-02-18 DIAGNOSIS — Z6841 Body Mass Index (BMI) 40.0 and over, adult: Secondary | ICD-10-CM | POA: Diagnosis not present

## 2019-02-18 DIAGNOSIS — E8881 Metabolic syndrome: Secondary | ICD-10-CM | POA: Diagnosis not present

## 2019-02-19 NOTE — Progress Notes (Signed)
Office: [phone]  /  Fax: [phone] TeleHealth Visit:  [name] has verbally consented to this TeleHealth visit today. The patient is located at home, the provider is located at the [facility] office. The participants in this visit include the listed provider and patient and any and all parties involved. The visit was conducted today via Skype.  HPI:   Chief Complaint: OBESITY Vanessa Christensen is here to discuss her progress with her obesity treatment plan. She is on the Category 4 plan and is following her eating plan approximately 75 % of the time. She states she is doing weights and cardio exercise 45 minutes 3 times per week. [name] states that her weight remains the same (weight 205 lbs, no date reported). She has recovered from the surgery. [name] denies problems with emotional eating. She states that it is harder to stay on the plan. We were unable to weigh the patient today for this TeleHealth visit. She feels as if she has maintained weight since her last visit. She has lost 0 lbs since starting treatment with [name].  Insulin Resistance (mild) Vanessa Christensen has a diagnosis of insulin resistance based on her elevated fasting insulin level >5. Although Vanessa Christensen's blood glucose readings are still under good control, insulin resistance puts her at greater risk of metabolic syndrome and diabetes. She is not on medications. Her last A1c was at 5.4 and last insulin level was at 10.9 She continues to work on diet and exercise to decrease risk of diabetes.  Vitamin D deficiency Vanessa Christensen has a diagnosis of vitamin D deficiency. She is currently taking vit D and denies nausea, vomiting or muscle weakness.  ASSESSMENT AND PLAN:  Insulin resistance  Vitamin D deficiency  Class 3 severe obesity with serious comorbidity and body mass index (BMI) of 40.0 to 44.9 in adult, unspecified obesity type ([place])  PLAN:  Insulin Resistance (mild) Vanessa Christensen will continue to work on weight loss,  exercise, increasing lean protein and decreasing simple carbohydrates in her diet to help decrease the risk of diabetes. She was informed that eating too many simple carbohydrates or too many calories at one sitting increases the likelihood of GI side effects. Vanessa Christensen  agreed to follow up with [name] as directed to monitor her progress.  Vitamin D Deficiency Vanessa Christensen was informed that low vitamin D levels contributes to fatigue and are associated with obesity, breast, and colon cancer. She agrees to continue to take prescription Vit D @50 ,000 IU every week and will follow up for routine testing of vitamin D, at least 2-3 times per year. She was informed of the risk of over-replacement of vitamin D and agrees to not increase her dose unless she discusses this with [name] first.  Obesity Vanessa Christensen is currently in the action stage of change. As such, her goal is to continue with weight loss efforts She has agreed to follow the Category 4 plan Vanessa Christensen will continue her exercise regimen and she will increase exercise (back into the gym) for weight loss and overall health benefits. We discussed the following Behavioral Modification Strategies today: increase H2O intake, no skipping meals, keeping healthy foods in the home, increasing lean protein intake, decreasing simple carbohydrates, increasing vegetables, decrease eating out and work on meal planning and intentional eating Vanessa Christensen will choose better snacks.  Vanessa Christensen has agreed to follow up with our clinic in 2 weeks. She was informed of the importance of frequent follow up visits to maximize her success with intensive lifestyle modifications for her multiple health conditions.  ALLERGIES: Allergies  Allergen Reactions  . Neulasta [Pegfilgrastim] Other (See Comments)    Raised lumps on legs, arms - had to be excised =  Happened every time pt received Neulasta.    MEDICATIONS: Current Outpatient Medications on File Prior to Visit  Medication Sig Dispense Refill   . acetaminophen (TYLENOL) 325 MG tablet Take 650 mg by mouth every 6 (six) hours as needed.    . Ascorbic Acid (VITAMIN C ER PO) Take 1 tablet by mouth daily.     Marland Kitchen aspirin 81 MG tablet Take 81 mg by mouth daily.    . AZOPT 1 % ophthalmic suspension PLACE 1 DROP INTO BOTH EYES TWICE DAILY  12  . b complex vitamins tablet Take 1 tablet by mouth daily.    Marland Kitchen escitalopram (LEXAPRO) 20 MG tablet TAKE 1 & 1/2 TABLETS BY MOUTH DAILY 135 tablet 3  . levothyroxine (SYNTHROID) 75 MCG tablet TAKE 1 TABLET BY MOUTH EVERY DAY 90 tablet 1  . Multiple Vitamins-Minerals (MULTIVITAMIN ADULTS 50+ PO) Take 1 tablet by mouth daily.     . psyllium (METAMUCIL) 58.6 % powder Take 1 packet by mouth daily.     Marland Kitchen tolterodine (DETROL LA) 4 MG 24 hr capsule TAKE 1 CAPSULE BY MOUTH EVERY DAY 90 capsule 2  . Vitamin D, Ergocalciferol, (DRISDOL) 1.25 MG (50000 UT) CAPS capsule Take 1 capsule (50,000 Units total) by mouth every 7 (seven) days. 4 capsule 0   No current facility-administered medications on file prior to visit.     PAST MEDICAL HISTORY: Past Medical History:  Diagnosis Date  . Anxiety    pt denies  . Arthritis    hands and feet  . Cancer ([place]) 10/04/2006   breast  . Cataracts, both eyes   . Depression   . Depression   . Diverticulosis of colon   . Glaucoma   . Heart murmur   . Joint pain   . Knee pain   . Obesity   . OSA on CPAP   . Osteoarthritis   . Sleep apnea   . Thyroid activity decreased     PAST SURGICAL HISTORY: Past Surgical History:  Procedure Laterality Date  . BREAST SURGERY Bilateral   . CATARACT EXTRACTION    . GLAUCOMA REPAIR    . MASTECTOMY, RADICAL Bilateral   . neulasta induced sterile abscesses    . THYROIDECTOMY, PARTIAL    . TOTAL KNEE ARTHROPLASTY Right     SOCIAL HISTORY: Social History   Tobacco Use  . Smoking status: Former Smoker    Quit date: 11/03/1978    Years since quitting: 40.3  . Smokeless tobacco: Never Used  Substance Use Topics  . Alcohol  use: No  . Drug use: No    FAMILY HISTORY: Family History  Problem Relation Age of Onset  . CVA Mother   . Cancer Mother 22       breast cancer   . Heart disease Mother   . Stroke Mother   . Obesity Mother   . Leukemia Father   . High blood pressure Father   . High Cholesterol Father   . Heart disease Father   . Cancer Maternal Aunt 55       breast cancer   . Cancer Maternal Aunt 55       breast cancer  . Cancer Cousin 32       breast cancer    ROS: Review of Systems  Constitutional: Negative for weight loss.  Gastrointestinal: Negative  for nausea and vomiting.  Musculoskeletal:       Negative for muscle weakness    PHYSICAL EXAM: Pt in no acute distress  RECENT LABS AND TESTS: BMET    Component Value Date/Time   NA 142 11/04/2018 0925   NA 141 07/13/2016 1015   K 4.8 11/04/2018 0925   K 4.3 07/13/2016 1015   CL 104 11/04/2018 0925   CO2 25 11/04/2018 0925   CO2 24 07/13/2016 1015   GLUCOSE 89 11/04/2018 0925   GLUCOSE 78 07/31/2018 1013   GLUCOSE 89 07/13/2016 1015   BUN 17 11/04/2018 0925   BUN 17.2 07/13/2016 1015   CREATININE 0.78 11/04/2018 0925   CREATININE 0.7 07/13/2016 1015   CALCIUM 9.9 11/04/2018 0925   CALCIUM 9.6 07/13/2016 1015   GFRNONAA 78 11/04/2018 0925   GFRAA 90 11/04/2018 0925   Lab Results  Component Value Date   HGBA1C 5.4 11/04/2018   HGBA1C 5.6 08/13/2018   Lab Results  Component Value Date   INSULIN 10.9 11/04/2018   INSULIN 13.2 08/13/2018   CBC    Component Value Date/Time   WBC 4.6 07/31/2018 1013   RBC 4.38 07/31/2018 1013   HGB 12.1 07/31/2018 1013   HGB 12.2 07/13/2016 1015   HCT 39.0 07/31/2018 1013   HCT 36.9 07/13/2016 1015   PLT 190 07/31/2018 1013   PLT 183 07/13/2016 1015   MCV 89.0 07/31/2018 1013   MCV 84.3 07/13/2016 1015   MCH 27.6 07/31/2018 1013   MCHC 31.0 07/31/2018 1013   RDW 14.2 07/31/2018 1013   RDW 15.2 (H) 07/13/2016 1015   LYMPHSABS 1.3 07/31/2018 1013   LYMPHSABS 1.3 07/13/2016  1015   MONOABS 0.5 07/31/2018 1013   MONOABS 0.5 07/13/2016 1015   EOSABS 0.0 07/31/2018 1013   EOSABS 0.0 07/13/2016 1015   BASOSABS 0.0 07/31/2018 1013   BASOSABS 0.0 07/13/2016 1015   Iron/TIBC/Ferritin/ %Sat No results found for: IRON, TIBC, FERRITIN, IRONPCTSAT Lipid Panel     Component Value Date/Time   CHOL 170 05/08/2018 0957   TRIG 114.0 05/08/2018 0957   HDL 43.40 05/08/2018 0957   CHOLHDL 4 05/08/2018 0957   VLDL 22.8 05/08/2018 0957   LDLCALC 103 (H) 05/08/2018 0957   Hepatic Function Panel     Component Value Date/Time   PROT 6.5 11/04/2018 0925   PROT 6.7 07/13/2016 1015   ALBUMIN 4.4 11/04/2018 0925   ALBUMIN 4.0 07/13/2016 1015   AST 30 11/04/2018 0925   AST 22 07/13/2016 1015   ALT 35 (H) 11/04/2018 0925   ALT 28 07/13/2016 1015   ALKPHOS 94 11/04/2018 0925   ALKPHOS 70 07/13/2016 1015   BILITOT 0.4 11/04/2018 0925   BILITOT 0.57 07/13/2016 1015   BILIDIR 0.1 05/08/2018 0957      Component Value Date/Time   TSH 1.05 05/08/2018 0957   TSH 1.32 11/07/2017 0921   TSH 0.86 05/09/2017 1006     Ref. Range 11/04/2018 09:25  Vitamin D, 25-Hydroxy Latest Ref Range: 30.0 - 100.0 ng/mL 42.4    I, Doreene Nest, am acting as Location manager for [employer]. [name], DO  I have reviewed the above documentation for accuracy and completeness, and I agree with the above. -Jearld Lesch, DO

## 2019-03-04 ENCOUNTER — Telehealth (INDEPENDENT_AMBULATORY_CARE_PROVIDER_SITE_OTHER): Payer: Medicare Other | Admitting: Bariatrics

## 2019-03-04 ENCOUNTER — Other Ambulatory Visit: Payer: Self-pay

## 2019-03-04 ENCOUNTER — Encounter (INDEPENDENT_AMBULATORY_CARE_PROVIDER_SITE_OTHER): Payer: Self-pay | Admitting: Bariatrics

## 2019-03-04 DIAGNOSIS — E8881 Metabolic syndrome: Secondary | ICD-10-CM | POA: Diagnosis not present

## 2019-03-04 DIAGNOSIS — Z6841 Body Mass Index (BMI) 40.0 and over, adult: Secondary | ICD-10-CM | POA: Diagnosis not present

## 2019-03-04 DIAGNOSIS — E559 Vitamin D deficiency, unspecified: Secondary | ICD-10-CM | POA: Diagnosis not present

## 2019-03-04 NOTE — Progress Notes (Signed)
Office: [phone]  /  Fax: [phone] TeleHealth Visit:  Vanessa Christensen has verbally consented to this TeleHealth visit today. The patient is located at home, the provider is located at the [employer] [facility] office. The participants in this visit include the listed provider and patient. The visit was conducted today via Skype.  HPI:   Chief Complaint: OBESITY Vanessa Christensen is here to discuss her progress with her obesity treatment plan. She is on the Category 4 plan and is following her eating plan approximately 75% of the time. She states she is exercising with cardio and weights 45 minutes 4 times per week. Vanessa Christensen states that she has lost 1-2 lbs (weight 203). We were unable to weigh the patient today for this TeleHealth visit. She feels as if she has lost 1-2 lbs since her last visit. She has lost 0 lbs since starting treatment with [name].  Insulin Resistance Vanessa Christensen has a diagnosis of insulin resistance based on her elevated fasting insulin level >5. Last A1c 5.4 on 11/04/2018 with an insulin of 10.9. Although Vanessa Christensen's blood glucose readings are still under good control, insulin resistance puts her at greater risk of metabolic syndrome and diabetes. She is on no medications currently and continues to work on diet and exercise to decrease risk of diabetes. No polyphagia.  Vitamin D deficiency Vanessa Christensen has a diagnosis of Vitamin D deficiency. Last Vitamin D 42.4 on 11/04/2018. She is currently taking Vit D and denies nausea, vomiting or muscle weakness.  ASSESSMENT AND PLAN:  Insulin resistance  Vitamin D deficiency  Class 3 severe obesity with serious comorbidity and body mass index (BMI) of 40.0 to 44.9 in adult, unspecified obesity type ([place])  PLAN:  Insulin Resistance Vanessa Christensen will continue to work on weight loss, exercise, and decreasing simple carbohydrates in her diet to help decrease the risk of diabetes. We dicussed metformin including benefits and risks. She was informed  that eating too many simple carbohydrates or too many calories at one sitting increases the likelihood of GI side effects. Anahli was instructed to decrease carbohydrates, increase protein, increase good fats, and increase exercise. She will follow-up with [name] as directed to monitor her progress.  Vitamin D Deficiency Vanessa Christensen was informed that low Vitamin D levels contributes to fatigue and are associated with obesity, breast, and colon cancer. She agrees to continue taking Vit D and will follow-up for routine testing of Vitamin D, at least 2-3 times per year. She was informed of the risk of over-replacement of Vitamin D and agrees to not increase her dose unless she discusses this with [name] first. Vanessa Christensen agrees to follow-up with our clinic in 2 weeks.  Obesity Vanessa Christensen is currently in the action stage of change. As such, her goal is to continue with weight loss efforts. She has agreed to follow the Category 4 plan. Vanessa Christensen will work on meal planning, intentional eating, and will make wise decisions when she goes out to eat. Vanessa Christensen has been instructed to continue exercise and increase her gym attendance from 3 to 4 times a week for weight loss and overall health benefits. We discussed the following Behavioral Modification Strategies today: increasing lean protein intake, decreasing simple carbohydrates, increasing vegetables, increase H20 intake, decrease eating out, no skipping meals, work on meal planning and easy cooking plans, keeping healthy foods in the home, and planning for success.  Vanessa Christensen has agreed to follow-up with our clinic in 2 weeks. She was informed of the importance of frequent follow-up visits to maximize her success  with intensive lifestyle modifications for her multiple health conditions.  ALLERGIES: Allergies  Allergen Reactions   Neulasta [Pegfilgrastim] Other (See Comments)    Raised lumps on legs, arms - had to be excised =  Happened every time pt received Neulasta.     MEDICATIONS: Current Outpatient Medications on File Prior to Visit  Medication Sig Dispense Refill   acetaminophen (TYLENOL) 325 MG tablet Take 650 mg by mouth every 6 (six) hours as needed.     Ascorbic Acid (VITAMIN C ER PO) Take 1 tablet by mouth daily.      aspirin 81 MG tablet Take 81 mg by mouth daily.     AZOPT 1 % ophthalmic suspension PLACE 1 DROP INTO BOTH EYES TWICE DAILY  12   b complex vitamins tablet Take 1 tablet by mouth daily.     escitalopram (LEXAPRO) 20 MG tablet TAKE 1 & 1/2 TABLETS BY MOUTH DAILY 135 tablet 3   levothyroxine (SYNTHROID) 75 MCG tablet TAKE 1 TABLET BY MOUTH EVERY DAY 90 tablet 1   Multiple Vitamins-Minerals (MULTIVITAMIN ADULTS 50+ PO) Take 1 tablet by mouth daily.      psyllium (METAMUCIL) 58.6 % powder Take 1 packet by mouth daily.      tolterodine (DETROL LA) 4 MG 24 hr capsule TAKE 1 CAPSULE BY MOUTH EVERY DAY 90 capsule 2   Vitamin D, Ergocalciferol, (DRISDOL) 1.25 MG (50000 UT) CAPS capsule Take 1 capsule (50,000 Units total) by mouth every 7 (seven) days. 4 capsule 0   No current facility-administered medications on file prior to visit.     PAST MEDICAL HISTORY: Past Medical History:  Diagnosis Date   Anxiety    pt denies   Arthritis    hands and feet   Cancer ([place]) 10/04/2006   breast   Cataracts, both eyes    Depression    Depression    Diverticulosis of colon    Glaucoma    Heart murmur    Joint pain    Knee pain    Obesity    OSA on CPAP    Osteoarthritis    Sleep apnea    Thyroid activity decreased     PAST SURGICAL HISTORY: Past Surgical History:  Procedure Laterality Date   BREAST SURGERY Bilateral    CATARACT EXTRACTION     GLAUCOMA REPAIR     MASTECTOMY, RADICAL Bilateral    neulasta induced sterile abscesses     THYROIDECTOMY, PARTIAL     TOTAL KNEE ARTHROPLASTY Right     SOCIAL HISTORY: Social History   Tobacco Use   Smoking status: Former Smoker    Quit date:  11/03/1978    Years since quitting: 40.3   Smokeless tobacco: Never Used  Substance Use Topics   Alcohol use: No   Drug use: No    FAMILY HISTORY: Family History  Problem Relation Age of Onset   CVA Mother    Cancer Mother 44       breast cancer    Heart disease Mother    Stroke Mother    Obesity Mother    Leukemia Father    High blood pressure Father    High Cholesterol Father    Heart disease Father    Cancer Maternal Aunt 58       breast cancer    Cancer Maternal Aunt 55       breast cancer   Cancer Cousin 32       breast cancer   ROS: Review of Systems  Gastrointestinal: Negative for nausea and vomiting.  Musculoskeletal:       Negative for muscle weakness.  Endo/Heme/Allergies:       Negative for polyphagia.   PHYSICAL EXAM: Pt in no acute distress  RECENT LABS AND TESTS: BMET    Component Value Date/Time   NA 142 11/04/2018 0925   NA 141 07/13/2016 1015   K 4.8 11/04/2018 0925   K 4.3 07/13/2016 1015   CL 104 11/04/2018 0925   CO2 25 11/04/2018 0925   CO2 24 07/13/2016 1015   GLUCOSE 89 11/04/2018 0925   GLUCOSE 78 07/31/2018 1013   GLUCOSE 89 07/13/2016 1015   BUN 17 11/04/2018 0925   BUN 17.2 07/13/2016 1015   CREATININE 0.78 11/04/2018 0925   CREATININE 0.7 07/13/2016 1015   CALCIUM 9.9 11/04/2018 0925   CALCIUM 9.6 07/13/2016 1015   GFRNONAA 78 11/04/2018 0925   GFRAA 90 11/04/2018 0925   Lab Results  Component Value Date   HGBA1C 5.4 11/04/2018   HGBA1C 5.6 08/13/2018   Lab Results  Component Value Date   INSULIN 10.9 11/04/2018   INSULIN 13.2 08/13/2018   CBC    Component Value Date/Time   WBC 4.6 07/31/2018 1013   RBC 4.38 07/31/2018 1013   HGB 12.1 07/31/2018 1013   HGB 12.2 07/13/2016 1015   HCT 39.0 07/31/2018 1013   HCT 36.9 07/13/2016 1015   PLT 190 07/31/2018 1013   PLT 183 07/13/2016 1015   MCV 89.0 07/31/2018 1013   MCV 84.3 07/13/2016 1015   MCH 27.6 07/31/2018 1013   MCHC 31.0 07/31/2018 1013    RDW 14.2 07/31/2018 1013   RDW 15.2 (H) 07/13/2016 1015   LYMPHSABS 1.3 07/31/2018 1013   LYMPHSABS 1.3 07/13/2016 1015   MONOABS 0.5 07/31/2018 1013   MONOABS 0.5 07/13/2016 1015   EOSABS 0.0 07/31/2018 1013   EOSABS 0.0 07/13/2016 1015   BASOSABS 0.0 07/31/2018 1013   BASOSABS 0.0 07/13/2016 1015   Iron/TIBC/Ferritin/ %Sat No results found for: IRON, TIBC, FERRITIN, IRONPCTSAT Lipid Panel     Component Value Date/Time   CHOL 170 05/08/2018 0957   TRIG 114.0 05/08/2018 0957   HDL 43.40 05/08/2018 0957   CHOLHDL 4 05/08/2018 0957   VLDL 22.8 05/08/2018 0957   LDLCALC 103 (H) 05/08/2018 0957   Hepatic Function Panel     Component Value Date/Time   PROT 6.5 11/04/2018 0925   PROT 6.7 07/13/2016 1015   ALBUMIN 4.4 11/04/2018 0925   ALBUMIN 4.0 07/13/2016 1015   AST 30 11/04/2018 0925   AST 22 07/13/2016 1015   ALT 35 (H) 11/04/2018 0925   ALT 28 07/13/2016 1015   ALKPHOS 94 11/04/2018 0925   ALKPHOS 70 07/13/2016 1015   BILITOT 0.4 11/04/2018 0925   BILITOT 0.57 07/13/2016 1015   BILIDIR 0.1 05/08/2018 0957      Component Value Date/Time   TSH 1.05 05/08/2018 0957   TSH 1.32 11/07/2017 0921   TSH 0.86 05/09/2017 1006   Results for AMBIKA, SCHUERMAN (MRN [id number]) as of 03/04/2019 12:03  Ref. Range 11/04/2018 09:25  Vitamin D, 25-Hydroxy Latest Ref Range: 30.0 - 100.0 ng/mL 42.4   I, Michaelene Song, am acting as Location manager for [employer], DO  I have reviewed the above documentation for accuracy and completeness, and I agree with the above. -Jearld Lesch, DO

## 2019-03-18 ENCOUNTER — Encounter (INDEPENDENT_AMBULATORY_CARE_PROVIDER_SITE_OTHER): Payer: Self-pay | Admitting: Bariatrics

## 2019-03-18 ENCOUNTER — Other Ambulatory Visit: Payer: Self-pay

## 2019-03-18 ENCOUNTER — Ambulatory Visit (INDEPENDENT_AMBULATORY_CARE_PROVIDER_SITE_OTHER): Payer: Medicare Other | Admitting: Bariatrics

## 2019-03-18 DIAGNOSIS — E038 Other specified hypothyroidism: Secondary | ICD-10-CM

## 2019-03-18 DIAGNOSIS — Z6841 Body Mass Index (BMI) 40.0 and over, adult: Secondary | ICD-10-CM | POA: Diagnosis not present

## 2019-03-18 DIAGNOSIS — E8881 Metabolic syndrome: Secondary | ICD-10-CM

## 2019-03-20 NOTE — Progress Notes (Signed)
Office: [phone]  /  Fax: [phone] TeleHealth Visit:  Vanessa Christensen has verbally consented to this TeleHealth visit today. The patient is located at home, the provider is located at the [employer] [facility] office. The participants in this visit include the listed provider and patient and any and all parties involved. The visit was conducted today via Skype.  HPI:   Chief Complaint: OBESITY Mama is here to discuss her progress with her obesity treatment plan. She is on the Category 4 plan and is following her eating plan approximately 70 % of the time. She states she is doing cardio exercise and weights 45 minutes 3 to 4 times per week. [name] states that her weight remains the same. Her classes are going well. [name] is not getting enough water and protein. We were unable to weigh the patient today for this TeleHealth visit. She feels as if she has maintained weight since her last visit (weight 203 lbs, date not reported). She has lost 0 lbs since starting treatment with [name].  Insulin Resistance (mild) [name] has a diagnosis of mild insulin resistance based on her elevated fasting insulin level >5. Although [name]'s blood glucose readings are still under good control, insulin resistance puts her at greater risk of metabolic syndrome and diabetes. Her appetite is stable. Last A1c was at 5.4 and last insulin level was at 10.9 She is not taking medications currently and continues to work on diet and exercise to decrease risk of diabetes.  Hypothyroidism [name] has a diagnosis of hypothyroidism. She is well controlled. She is stable on levothyroxine. She has no new symptoms.  ASSESSMENT AND PLAN:  Insulin resistance  Other specified hypothyroidism  Class 3 severe obesity with serious comorbidity and body mass index (BMI) of 40.0 to 44.9 in adult, unspecified obesity type ([place])  PLAN:  Insulin Resistance [name] will continue to work on weight loss, exercise, increasing lean  protein and "healthy fats", and decreasing simple carbohydrates in her diet to help decrease the risk of diabetes. She was informed that eating too many simple carbohydrates or too many calories at one sitting increases the likelihood of GI side effects. Adelynn will follow up with [name] as directed to monitor her progress.  Hypothyroidism [name] was informed of the importance of good thyroid control to help with weight loss efforts. She was also informed that supertherapeutic thyroid levels are dangerous and will not improve weight loss results. [name] will continue levothyroxine.  Obesity [name] is currently in the action stage of change. As such, her goal is to continue with weight loss efforts She has agreed to follow the Category 4 plan [name] will increase her exercise and weights for weight loss and overall health benefits. We discussed the following Behavioral Modification Strategies today: planning for success, increase H2O intake (alarm on her phone), no skipping meals, keeping healthy foods in the home, increasing lean protein intake, decreasing simple carbohydrates, increasing vegetables, decrease eating out and work on meal planning and intentional eating [name] will continue her activity. She will weigh the meat.  [name] has agreed to follow up with our clinic in 2 weeks. She was informed of the importance of frequent follow up visits to maximize her success with intensive lifestyle modifications for her multiple health conditions.  ALLERGIES: Allergies  Allergen Reactions  . Neulasta [Pegfilgrastim] Other (See Comments)    Raised lumps on legs, arms - had to be excised =  Happened every time pt received Neulasta.    MEDICATIONS: Current Outpatient Medications  on File Prior to Visit  Medication Sig Dispense Refill  . acetaminophen (TYLENOL) 325 MG tablet Take 650 mg by mouth every 6 (six) hours as needed.    . Ascorbic Acid (VITAMIN C ER PO) Take 1 tablet by mouth daily.     Marland Kitchen  aspirin 81 MG tablet Take 81 mg by mouth daily.    . AZOPT 1 % ophthalmic suspension PLACE 1 DROP INTO BOTH EYES TWICE DAILY  12  . b complex vitamins tablet Take 1 tablet by mouth daily.    Marland Kitchen escitalopram (LEXAPRO) 20 MG tablet TAKE 1 & 1/2 TABLETS BY MOUTH DAILY 135 tablet 3  . levothyroxine (SYNTHROID) 75 MCG tablet TAKE 1 TABLET BY MOUTH EVERY DAY 90 tablet 1  . Multiple Vitamins-Minerals (MULTIVITAMIN ADULTS 50+ PO) Take 1 tablet by mouth daily.     . psyllium (METAMUCIL) 58.6 % powder Take 1 packet by mouth daily.     Marland Kitchen tolterodine (DETROL LA) 4 MG 24 hr capsule TAKE 1 CAPSULE BY MOUTH EVERY DAY 90 capsule 2  . Vitamin D, Ergocalciferol, (DRISDOL) 1.25 MG (50000 UT) CAPS capsule Take 1 capsule (50,000 Units total) by mouth every 7 (seven) days. 4 capsule 0   No current facility-administered medications on file prior to visit.     PAST MEDICAL HISTORY: Past Medical History:  Diagnosis Date  . Anxiety    pt denies  . Arthritis    hands and feet  . Cancer ([place]) 10/04/2006   breast  . Cataracts, both eyes   . Depression   . Depression   . Diverticulosis of colon   . Glaucoma   . Heart murmur   . Joint pain   . Knee pain   . Obesity   . OSA on CPAP   . Osteoarthritis   . Sleep apnea   . Thyroid activity decreased     PAST SURGICAL HISTORY: Past Surgical History:  Procedure Laterality Date  . BREAST SURGERY Bilateral   . CATARACT EXTRACTION    . GLAUCOMA REPAIR    . MASTECTOMY, RADICAL Bilateral   . neulasta induced sterile abscesses    . THYROIDECTOMY, PARTIAL    . TOTAL KNEE ARTHROPLASTY Right     SOCIAL HISTORY: Social History   Tobacco Use  . Smoking status: Former Smoker    Quit date: 11/03/1978    Years since quitting: 40.4  . Smokeless tobacco: Never Used  Substance Use Topics  . Alcohol use: No  . Drug use: No    FAMILY HISTORY: Family History  Problem Relation Age of Onset  . CVA Mother   . Cancer Mother 83       breast cancer   . Heart  disease Mother   . Stroke Mother   . Obesity Mother   . Leukemia Father   . High blood pressure Father   . High Cholesterol Father   . Heart disease Father   . Cancer Maternal Aunt 55       breast cancer   . Cancer Maternal Aunt 55       breast cancer  . Cancer Cousin 32       breast cancer    ROS: Review of Systems  Constitutional: Negative for weight loss.  Cardiovascular: Negative for palpitations.  Endo/Heme/Allergies:       Negative for polyphagia Negative for hot or cold intolerance    PHYSICAL EXAM: Pt in no acute distress  RECENT LABS AND TESTS: BMET    Component Value  Date/Time   NA 142 11/04/2018 0925   NA 141 07/13/2016 1015   K 4.8 11/04/2018 0925   K 4.3 07/13/2016 1015   CL 104 11/04/2018 0925   CO2 25 11/04/2018 0925   CO2 24 07/13/2016 1015   GLUCOSE 89 11/04/2018 0925   GLUCOSE 78 07/31/2018 1013   GLUCOSE 89 07/13/2016 1015   BUN 17 11/04/2018 0925   BUN 17.2 07/13/2016 1015   CREATININE 0.78 11/04/2018 0925   CREATININE 0.7 07/13/2016 1015   CALCIUM 9.9 11/04/2018 0925   CALCIUM 9.6 07/13/2016 1015   GFRNONAA 78 11/04/2018 0925   GFRAA 90 11/04/2018 0925   Lab Results  Component Value Date   HGBA1C 5.4 11/04/2018   HGBA1C 5.6 08/13/2018   Lab Results  Component Value Date   INSULIN 10.9 11/04/2018   INSULIN 13.2 08/13/2018   CBC    Component Value Date/Time   WBC 4.6 07/31/2018 1013   RBC 4.38 07/31/2018 1013   HGB 12.1 07/31/2018 1013   HGB 12.2 07/13/2016 1015   HCT 39.0 07/31/2018 1013   HCT 36.9 07/13/2016 1015   PLT 190 07/31/2018 1013   PLT 183 07/13/2016 1015   MCV 89.0 07/31/2018 1013   MCV 84.3 07/13/2016 1015   MCH 27.6 07/31/2018 1013   MCHC 31.0 07/31/2018 1013   RDW 14.2 07/31/2018 1013   RDW 15.2 (H) 07/13/2016 1015   LYMPHSABS 1.3 07/31/2018 1013   LYMPHSABS 1.3 07/13/2016 1015   MONOABS 0.5 07/31/2018 1013   MONOABS 0.5 07/13/2016 1015   EOSABS 0.0 07/31/2018 1013   EOSABS 0.0 07/13/2016 1015    BASOSABS 0.0 07/31/2018 1013   BASOSABS 0.0 07/13/2016 1015   Iron/TIBC/Ferritin/ %Sat No results found for: IRON, TIBC, FERRITIN, IRONPCTSAT Lipid Panel     Component Value Date/Time   CHOL 170 05/08/2018 0957   TRIG 114.0 05/08/2018 0957   HDL 43.40 05/08/2018 0957   CHOLHDL 4 05/08/2018 0957   VLDL 22.8 05/08/2018 0957   LDLCALC 103 (H) 05/08/2018 0957   Hepatic Function Panel     Component Value Date/Time   PROT 6.5 11/04/2018 0925   PROT 6.7 07/13/2016 1015   ALBUMIN 4.4 11/04/2018 0925   ALBUMIN 4.0 07/13/2016 1015   AST 30 11/04/2018 0925   AST 22 07/13/2016 1015   ALT 35 (H) 11/04/2018 0925   ALT 28 07/13/2016 1015   ALKPHOS 94 11/04/2018 0925   ALKPHOS 70 07/13/2016 1015   BILITOT 0.4 11/04/2018 0925   BILITOT 0.57 07/13/2016 1015   BILIDIR 0.1 05/08/2018 0957      Component Value Date/Time   TSH 1.05 05/08/2018 0957   TSH 1.32 11/07/2017 0921   TSH 0.86 05/09/2017 1006     Ref. Range 11/04/2018 09:25  Vitamin D, 25-Hydroxy Latest Ref Range: 30.0 - 100.0 ng/mL 42.4    I, Doreene Nest, am acting as Location manager for [employer]. [name], DO  I have reviewed the above documentation for accuracy and completeness, and I agree with the above. -Jearld Lesch, DO

## 2019-03-24 ENCOUNTER — Encounter (INDEPENDENT_AMBULATORY_CARE_PROVIDER_SITE_OTHER): Payer: Self-pay | Admitting: Bariatrics

## 2019-04-01 ENCOUNTER — Other Ambulatory Visit: Payer: Self-pay

## 2019-04-01 ENCOUNTER — Encounter (INDEPENDENT_AMBULATORY_CARE_PROVIDER_SITE_OTHER): Payer: Self-pay | Admitting: Bariatrics

## 2019-04-01 ENCOUNTER — Ambulatory Visit (INDEPENDENT_AMBULATORY_CARE_PROVIDER_SITE_OTHER): Payer: Medicare Other | Admitting: Bariatrics

## 2019-04-01 DIAGNOSIS — Z6841 Body Mass Index (BMI) 40.0 and over, adult: Secondary | ICD-10-CM | POA: Diagnosis not present

## 2019-04-01 DIAGNOSIS — M25562 Pain in left knee: Secondary | ICD-10-CM

## 2019-04-01 DIAGNOSIS — E8881 Metabolic syndrome: Secondary | ICD-10-CM

## 2019-04-01 NOTE — Progress Notes (Signed)
Office: [phone]  /  Fax: [phone] TeleHealth Visit:  Vanessa Christensen has verbally consented to this TeleHealth visit today. The patient is located at home, the provider is located at the [employer] [facility] office. The participants in this visit include the listed provider and patient. The visit was conducted today via Skype.  HPI:   Chief Complaint: OBESITY Vanessa Christensen is here to discuss her progress with her obesity treatment plan. She is on the Category 4 plan and is following her eating plan approximately 60% of the time. She states she is doing strengthening/cardio 45 minutes 4 times per week. Vanessa Christensen is down 1 lb (weight 202). She has struggled with cookies. She has been very active over the weekend. We were unable to weigh the patient today for this TeleHealth visit. She feels as if she has lost 1 lb since her last visit. She has lost 0 lbs since starting treatment with [name].  Left Knee Pain Vanessa Christensen is going to the gym 4 days a week. She has increased her exercise and reports minimal pain.  Insulin Resistance (Improving) Vanessa Christensen has a diagnosis of insulin resistance based on her elevated fasting insulin level >5. Last A1c 5.4 on 11/04/2018 with an insulin of 10.9. She reports her appetite to be normal. Although Vanessa Christensen's blood glucose readings are still under good control, insulin resistance puts her at greater risk of metabolic syndrome and diabetes. She is not taking metformin currently and continues to work on diet and exercise to decrease risk of diabetes.  ASSESSMENT AND PLAN:  Left knee pain, unspecified chronicity  Insulin resistance  Class 3 severe obesity with serious comorbidity and body mass index (BMI) of 40.0 to 44.9 in adult, unspecified obesity type ([place])  PLAN:  Left Knee Pain Vanessa Christensen was advised to not do any pounding exercises and will do appropriate exercises.  Insulin Resistance (Improving) Vanessa Christensen will continue to work on weight loss, exercise, and  decreasing simple carbohydrates in her diet to help decrease the risk of diabetes. We dicussed metformin including benefits and risks. She was informed that eating too many simple carbohydrates or too many calories at one sitting increases the likelihood of GI side effects. Vanessa Christensen will decrease carbohydrates and increase protein. She will follow-up as directed to monitor her progress.  Obesity Vanessa Christensen is currently in the action stage of change. As such, her goal is to continue with weight loss efforts. She has agreed to follow the Category 4 plan. Vanessa Christensen will work on meal planning, continue to weigh herself at home, will practice greater adherence to the program, will weigh her food, and will do some journaling. Vanessa Christensen has been instructed to work up to a goal of 150 minutes of combined cardio and strengthening exercise per week for weight loss and overall health benefits. We discussed the following Behavioral Modification Strategies today: increasing lean protein intake, decreasing simple carbohydrates, increasing vegetables, decrease eating out, work on meal planning and easy cooking plans, keeping healthy foods in the home, better snacking choices, and celebration eating strategies. Vanessa Christensen has agreed to follow-up with our clinic in 2-3 weeks. She was informed of the importance of frequent follow-up visits to maximize her success with intensive lifestyle modifications for her multiple health conditions.  ALLERGIES: Allergies  Allergen Reactions  . Neulasta [Pegfilgrastim] Other (See Comments)    Raised lumps on legs, arms - had to be excised =  Happened every time pt received Neulasta.    MEDICATIONS: Current Outpatient Medications on File Prior to Visit  Medication  Sig Dispense Refill  . acetaminophen (TYLENOL) 325 MG tablet Take 650 mg by mouth every 6 (six) hours as needed.    . Ascorbic Acid (VITAMIN C ER PO) Take 1 tablet by mouth daily.     Marland Kitchen aspirin 81 MG tablet Take 81 mg by mouth  daily.    . AZOPT 1 % ophthalmic suspension PLACE 1 DROP INTO BOTH EYES TWICE DAILY  12  . b complex vitamins tablet Take 1 tablet by mouth daily.    Marland Kitchen escitalopram (LEXAPRO) 20 MG tablet TAKE 1 & 1/2 TABLETS BY MOUTH DAILY 135 tablet 3  . levothyroxine (SYNTHROID) 75 MCG tablet TAKE 1 TABLET BY MOUTH EVERY DAY 90 tablet 1  . Multiple Vitamins-Minerals (MULTIVITAMIN ADULTS 50+ PO) Take 1 tablet by mouth daily.     . psyllium (METAMUCIL) 58.6 % powder Take 1 packet by mouth daily.     Marland Kitchen tolterodine (DETROL LA) 4 MG 24 hr capsule TAKE 1 CAPSULE BY MOUTH EVERY DAY 90 capsule 2  . Vitamin D, Ergocalciferol, (DRISDOL) 1.25 MG (50000 UT) CAPS capsule Take 1 capsule (50,000 Units total) by mouth every 7 (seven) days. 4 capsule 0   No current facility-administered medications on file prior to visit.     PAST MEDICAL HISTORY: Past Medical History:  Diagnosis Date  . Anxiety    pt denies  . Arthritis    hands and feet  . Cancer ([place]) 10/04/2006   breast  . Cataracts, both eyes   . Depression   . Depression   . Diverticulosis of colon   . Glaucoma   . Heart murmur   . Joint pain   . Knee pain   . Obesity   . OSA on CPAP   . Osteoarthritis   . Sleep apnea   . Thyroid activity decreased     PAST SURGICAL HISTORY: Past Surgical History:  Procedure Laterality Date  . BREAST SURGERY Bilateral   . CATARACT EXTRACTION    . GLAUCOMA REPAIR    . MASTECTOMY, RADICAL Bilateral   . neulasta induced sterile abscesses    . THYROIDECTOMY, PARTIAL    . TOTAL KNEE ARTHROPLASTY Right     SOCIAL HISTORY: Social History   Tobacco Use  . Smoking status: Former Smoker    Quit date: 11/03/1978    Years since quitting: 40.4  . Smokeless tobacco: Never Used  Substance Use Topics  . Alcohol use: No  . Drug use: No    FAMILY HISTORY: Family History  Problem Relation Age of Onset  . CVA Mother   . Cancer Mother 77       breast cancer   . Heart disease Mother   . Stroke Mother   . Obesity  Mother   . Leukemia Father   . High blood pressure Father   . High Cholesterol Father   . Heart disease Father   . Cancer Maternal Aunt 55       breast cancer   . Cancer Maternal Aunt 55       breast cancer  . Cancer Cousin 32       breast cancer   ROS: Review of Systems  Musculoskeletal: Positive for joint pain (left knee).   PHYSICAL EXAM: Pt in no acute distress  RECENT LABS AND TESTS: BMET    Component Value Date/Time   NA 142 11/04/2018 0925   NA 141 07/13/2016 1015   K 4.8 11/04/2018 0925   K 4.3 07/13/2016 1015   CL 104  11/04/2018 0925   CO2 25 11/04/2018 0925   CO2 24 07/13/2016 1015   GLUCOSE 89 11/04/2018 0925   GLUCOSE 78 07/31/2018 1013   GLUCOSE 89 07/13/2016 1015   BUN 17 11/04/2018 0925   BUN 17.2 07/13/2016 1015   CREATININE 0.78 11/04/2018 0925   CREATININE 0.7 07/13/2016 1015   CALCIUM 9.9 11/04/2018 0925   CALCIUM 9.6 07/13/2016 1015   GFRNONAA 78 11/04/2018 0925   GFRAA 90 11/04/2018 0925   Lab Results  Component Value Date   HGBA1C 5.4 11/04/2018   HGBA1C 5.6 08/13/2018   Lab Results  Component Value Date   INSULIN 10.9 11/04/2018   INSULIN 13.2 08/13/2018   CBC    Component Value Date/Time   WBC 4.6 07/31/2018 1013   RBC 4.38 07/31/2018 1013   HGB 12.1 07/31/2018 1013   HGB 12.2 07/13/2016 1015   HCT 39.0 07/31/2018 1013   HCT 36.9 07/13/2016 1015   PLT 190 07/31/2018 1013   PLT 183 07/13/2016 1015   MCV 89.0 07/31/2018 1013   MCV 84.3 07/13/2016 1015   MCH 27.6 07/31/2018 1013   MCHC 31.0 07/31/2018 1013   RDW 14.2 07/31/2018 1013   RDW 15.2 (H) 07/13/2016 1015   LYMPHSABS 1.3 07/31/2018 1013   LYMPHSABS 1.3 07/13/2016 1015   MONOABS 0.5 07/31/2018 1013   MONOABS 0.5 07/13/2016 1015   EOSABS 0.0 07/31/2018 1013   EOSABS 0.0 07/13/2016 1015   BASOSABS 0.0 07/31/2018 1013   BASOSABS 0.0 07/13/2016 1015   Iron/TIBC/Ferritin/ %Sat No results found for: IRON, TIBC, FERRITIN, IRONPCTSAT Lipid Panel     Component Value  Date/Time   CHOL 170 05/08/2018 0957   TRIG 114.0 05/08/2018 0957   HDL 43.40 05/08/2018 0957   CHOLHDL 4 05/08/2018 0957   VLDL 22.8 05/08/2018 0957   LDLCALC 103 (H) 05/08/2018 0957   Hepatic Function Panel     Component Value Date/Time   PROT 6.5 11/04/2018 0925   PROT 6.7 07/13/2016 1015   ALBUMIN 4.4 11/04/2018 0925   ALBUMIN 4.0 07/13/2016 1015   AST 30 11/04/2018 0925   AST 22 07/13/2016 1015   ALT 35 (H) 11/04/2018 0925   ALT 28 07/13/2016 1015   ALKPHOS 94 11/04/2018 0925   ALKPHOS 70 07/13/2016 1015   BILITOT 0.4 11/04/2018 0925   BILITOT 0.57 07/13/2016 1015   BILIDIR 0.1 05/08/2018 0957      Component Value Date/Time   TSH 1.05 05/08/2018 0957   TSH 1.32 11/07/2017 0921   TSH 0.86 05/09/2017 1006   Results for SANTIA, HARTELL (MRN [id number]) as of 04/01/2019 15:21  Ref. Range 11/04/2018 09:25  Vitamin D, 25-Hydroxy Latest Ref Range: 30.0 - 100.0 ng/mL 42.4   I, Michaelene Song, am acting as Location manager for [employer], DO  I have reviewed the above documentation for accuracy and completeness, and I agree with the above. -Jearld Lesch, DO

## 2019-04-02 ENCOUNTER — Encounter (INDEPENDENT_AMBULATORY_CARE_PROVIDER_SITE_OTHER): Payer: Self-pay | Admitting: Bariatrics

## 2019-04-22 ENCOUNTER — Encounter (INDEPENDENT_AMBULATORY_CARE_PROVIDER_SITE_OTHER): Payer: Self-pay | Admitting: Bariatrics

## 2019-04-22 ENCOUNTER — Other Ambulatory Visit: Payer: Self-pay

## 2019-04-22 ENCOUNTER — Telehealth (INDEPENDENT_AMBULATORY_CARE_PROVIDER_SITE_OTHER): Payer: Medicare Other | Admitting: Bariatrics

## 2019-04-22 DIAGNOSIS — Z6841 Body Mass Index (BMI) 40.0 and over, adult: Secondary | ICD-10-CM | POA: Diagnosis not present

## 2019-04-22 DIAGNOSIS — E559 Vitamin D deficiency, unspecified: Secondary | ICD-10-CM

## 2019-04-22 DIAGNOSIS — E8881 Metabolic syndrome: Secondary | ICD-10-CM | POA: Diagnosis not present

## 2019-04-23 NOTE — Progress Notes (Signed)
Office: [phone]  /  Fax: [phone] TeleHealth Visit:  Vanessa Christensen has verbally consented to this TeleHealth visit today. The patient is located at home, the provider is located at the [facility] office. The participants in this visit include the listed provider and patient. The visit was conducted today via Webex.  HPI:   Chief Complaint: OBESITY Vanessa Christensen is here to discuss her progress with her obesity treatment plan. She is on the Category 4 plan and is following her eating plan approximately 60% of the time. She states she is doing strength/cardio 45 minutes 3-4 times per week. Vanessa Christensen is down an additional 2 lbs (199). She is below 200 lbs. She reports doing well with breakfast and doing okay with lunch. She has had some chocolate cravings.  We were unable to weigh the patient today for this TeleHealth visit. She feels as if she has lost 2 lbs since her last visit. She has lost 0 lbs since starting treatment with [name].  Insulin Resistance (Mild) Vanessa Christensen has a diagnosis of mild insulin resistance based on her elevated fasting insulin level >5. Last A1c 5.4 on 11/04/2018 with an insulin of 10.9. Although Ethelyne's blood glucose readings are still under good control, insulin resistance puts her at greater risk of metabolic syndrome and diabetes. She is on no medications currently and continues to work on diet and exercise to decrease risk of diabetes. No polydipsia or polyuria.  Vitamin D deficiency Vanessa Christensen has a diagnosis of Vitamin D deficiency. She is currently taking high dose Vit D and denies nausea, vomiting or muscle weakness.  ASSESSMENT AND PLAN:  Insulin resistance  Vitamin D deficiency  Class 3 severe obesity with serious comorbidity and body mass index (BMI) of 40.0 to 44.9 in adult, unspecified obesity type (Vanessa Christensen)  PLAN:  Insulin Resistance (Mild) Vanessa Christensen will continue to work on weight loss, exercise, and decreasing simple carbohydrates in her diet to  help decrease the risk of diabetes. We dicussed metformin including benefits and risks. She was informed that eating too many simple carbohydrates or too many calories at one sitting increases the likelihood of GI side effects. Vanessa Christensen will decrease carbohydrates, more refined carbohydrates, and decrease simple carbohydrates.  Vitamin D Deficiency Vanessa Christensen was informed that low Vitamin D levels contributes to fatigue and are associated with obesity, breast, and colon cancer. She agrees to continue taking high dose Vit D and will follow-up for routine testing of Vitamin D, at least 2-3 times per year. She was informed of the risk of over-replacement of Vitamin D and agrees to not increase her dose unless she discusses this with [name] first. Vanessa Christensen agrees to follow-up with our clinic in 2-3 weeks.  Obesity Vanessa Christensen is currently in the action stage of change. As such, her goal is to continue with weight loss efforts. She has agreed to follow the Category 4 plan. Vanessa Christensen will work on meal planning.  She was given Thanksgiving information to prevent weight gain. Vanessa Christensen has been instructed to continue exercise and increase intensity for weight loss and overall health benefits. We discussed the following Behavioral Modification Strategies today: increasing lean protein intake, decreasing simple carbohydrates, increasing vegetables, increase H20 intake, travel eating strategies, holiday eating strategies, celebration eating strategies, and avoiding temptations.  Vanessa Christensen has agreed to follow-up with our clinic in 2-3 weeks. She was informed of the importance of frequent follow-up visits to maximize her success with intensive lifestyle modifications for her multiple health conditions.  ALLERGIES: Allergies  Allergen Reactions  .  Neulasta [Pegfilgrastim] Other (See Comments)    Raised lumps on legs, arms - had to be excised =  Happened every time pt received Neulasta.    MEDICATIONS: Current Outpatient  Medications on File Prior to Visit  Medication Sig Dispense Refill  . acetaminophen (TYLENOL) 325 MG tablet Take 650 mg by mouth every 6 (six) hours as needed.    . Ascorbic Acid (VITAMIN C ER PO) Take 1 tablet by mouth daily.     Marland Kitchen aspirin 81 MG tablet Take 81 mg by mouth daily.    . AZOPT 1 % ophthalmic suspension PLACE 1 DROP INTO BOTH EYES TWICE DAILY  12  . b complex vitamins tablet Take 1 tablet by mouth daily.    Marland Kitchen escitalopram (LEXAPRO) 20 MG tablet TAKE 1 & 1/2 TABLETS BY MOUTH DAILY 135 tablet 3  . levothyroxine (SYNTHROID) 75 MCG tablet TAKE 1 TABLET BY MOUTH EVERY DAY 90 tablet 1  . Multiple Vitamins-Minerals (MULTIVITAMIN ADULTS 50+ PO) Take 1 tablet by mouth daily.     . psyllium (METAMUCIL) 58.6 % powder Take 1 packet by mouth daily.     Marland Kitchen tolterodine (DETROL LA) 4 MG 24 hr capsule TAKE 1 CAPSULE BY MOUTH EVERY DAY 90 capsule 2  . Vitamin D, Ergocalciferol, (DRISDOL) 1.25 MG (50000 UT) CAPS capsule Take 1 capsule (50,000 Units total) by mouth every 7 (seven) days. 4 capsule 0   No current facility-administered medications on file prior to visit.     PAST MEDICAL HISTORY: Past Medical History:  Diagnosis Date  . Anxiety    pt denies  . Arthritis    hands and feet  . Cancer ([name]) 10/04/2006   breast  . Cataracts, both eyes   . Depression   . Depression   . Diverticulosis of colon   . Glaucoma   . Heart murmur   . Joint pain   . Knee pain   . Obesity   . OSA on CPAP   . Osteoarthritis   . Sleep apnea   . Thyroid activity decreased     PAST SURGICAL HISTORY: Past Surgical History:  Procedure Laterality Date  . BREAST SURGERY Bilateral   . CATARACT EXTRACTION    . GLAUCOMA REPAIR    . MASTECTOMY, RADICAL Bilateral   . neulasta induced sterile abscesses    . THYROIDECTOMY, PARTIAL    . TOTAL KNEE ARTHROPLASTY Right     SOCIAL HISTORY: Social History   Tobacco Use  . Smoking status: Former Smoker    Quit date: 11/03/1978    Years since quitting: 40.4   . Smokeless tobacco: Never Used  Substance Use Topics  . Alcohol use: No  . Drug use: No    FAMILY HISTORY: Family History  Problem Relation Age of Onset  . CVA Mother   . Cancer Mother 41       breast cancer   . Heart disease Mother   . Stroke Mother   . Obesity Mother   . Leukemia Father   . High blood pressure Father   . High Cholesterol Father   . Heart disease Father   . Cancer Maternal Aunt 55       breast cancer   . Cancer Maternal Aunt 55       breast cancer  . Cancer Cousin 32       breast cancer   ROS: Review of Systems  Gastrointestinal: Negative for nausea and vomiting.  Genitourinary:       Negative for  polyuria.  Musculoskeletal:       Negative for muscle weakness.  Endo/Heme/Allergies: Negative for polydipsia.   PHYSICAL EXAM: Pt in no acute distress  RECENT LABS AND TESTS: BMET    Component Value Date/Time   NA 142 11/04/2018 0925   NA 141 07/13/2016 1015   K 4.8 11/04/2018 0925   K 4.3 07/13/2016 1015   CL 104 11/04/2018 0925   CO2 25 11/04/2018 0925   CO2 24 07/13/2016 1015   GLUCOSE 89 11/04/2018 0925   GLUCOSE 78 07/31/2018 1013   GLUCOSE 89 07/13/2016 1015   BUN 17 11/04/2018 0925   BUN 17.2 07/13/2016 1015   CREATININE 0.78 11/04/2018 0925   CREATININE 0.7 07/13/2016 1015   CALCIUM 9.9 11/04/2018 0925   CALCIUM 9.6 07/13/2016 1015   GFRNONAA 78 11/04/2018 0925   GFRAA 90 11/04/2018 0925   Lab Results  Component Value Date   HGBA1C 5.4 11/04/2018   HGBA1C 5.6 08/13/2018   Lab Results  Component Value Date   INSULIN 10.9 11/04/2018   INSULIN 13.2 08/13/2018   CBC    Component Value Date/Time   WBC 4.6 07/31/2018 1013   RBC 4.38 07/31/2018 1013   HGB 12.1 07/31/2018 1013   HGB 12.2 07/13/2016 1015   HCT 39.0 07/31/2018 1013   HCT 36.9 07/13/2016 1015   PLT 190 07/31/2018 1013   PLT 183 07/13/2016 1015   MCV 89.0 07/31/2018 1013   MCV 84.3 07/13/2016 1015   MCH 27.6 07/31/2018 1013   MCHC 31.0 07/31/2018 1013    RDW 14.2 07/31/2018 1013   RDW 15.2 (H) 07/13/2016 1015   LYMPHSABS 1.3 07/31/2018 1013   LYMPHSABS 1.3 07/13/2016 1015   MONOABS 0.5 07/31/2018 1013   MONOABS 0.5 07/13/2016 1015   EOSABS 0.0 07/31/2018 1013   EOSABS 0.0 07/13/2016 1015   BASOSABS 0.0 07/31/2018 1013   BASOSABS 0.0 07/13/2016 1015   Iron/TIBC/Ferritin/ %Sat No results found for: IRON, TIBC, FERRITIN, IRONPCTSAT Lipid Panel     Component Value Date/Time   CHOL 170 05/08/2018 0957   TRIG 114.0 05/08/2018 0957   HDL 43.40 05/08/2018 0957   CHOLHDL 4 05/08/2018 0957   VLDL 22.8 05/08/2018 0957   LDLCALC 103 (H) 05/08/2018 0957   Hepatic Function Panel     Component Value Date/Time   PROT 6.5 11/04/2018 0925   PROT 6.7 07/13/2016 1015   ALBUMIN 4.4 11/04/2018 0925   ALBUMIN 4.0 07/13/2016 1015   AST 30 11/04/2018 0925   AST 22 07/13/2016 1015   ALT 35 (H) 11/04/2018 0925   ALT 28 07/13/2016 1015   ALKPHOS 94 11/04/2018 0925   ALKPHOS 70 07/13/2016 1015   BILITOT 0.4 11/04/2018 0925   BILITOT 0.57 07/13/2016 1015   BILIDIR 0.1 05/08/2018 0957      Component Value Date/Time   TSH 1.05 05/08/2018 0957   TSH 1.32 11/07/2017 0921   TSH 0.86 05/09/2017 1006   Results for ALLEAH, MATIS (MRN [id number]) as of 04/23/2019 07:54  Ref. Range 11/04/2018 09:25  Vitamin D, 25-Hydroxy Latest Ref Range: 30.0 - 100.0 ng/mL 42.4   I, Michaelene Song, am acting as Location manager for [employer], DO  I have reviewed the above documentation for accuracy and completeness, and I agree with the above. -Jearld Lesch, DO

## 2019-04-25 ENCOUNTER — Other Ambulatory Visit: Payer: Self-pay

## 2019-05-14 ENCOUNTER — Telehealth (INDEPENDENT_AMBULATORY_CARE_PROVIDER_SITE_OTHER): Payer: Medicare Other | Admitting: Bariatrics

## 2019-05-15 ENCOUNTER — Telehealth (INDEPENDENT_AMBULATORY_CARE_PROVIDER_SITE_OTHER): Payer: Medicare Other | Admitting: Bariatrics

## 2019-05-15 ENCOUNTER — Other Ambulatory Visit: Payer: Self-pay

## 2019-05-15 ENCOUNTER — Encounter (INDEPENDENT_AMBULATORY_CARE_PROVIDER_SITE_OTHER): Payer: Self-pay | Admitting: Bariatrics

## 2019-05-15 DIAGNOSIS — Z6841 Body Mass Index (BMI) 40.0 and over, adult: Secondary | ICD-10-CM | POA: Diagnosis not present

## 2019-05-15 DIAGNOSIS — E559 Vitamin D deficiency, unspecified: Secondary | ICD-10-CM

## 2019-05-15 DIAGNOSIS — E038 Other specified hypothyroidism: Secondary | ICD-10-CM | POA: Diagnosis not present

## 2019-05-15 NOTE — Progress Notes (Signed)
Office: [phone]  /  Fax: [phone] TeleHealth Visit:  Wajeeha Alsbury has verbally consented to this TeleHealth visit today. The patient is located at home, the provider is located at the [facility] office. The participants in this visit include the listed provider and patient. The visit was conducted today via Webex.  HPI:  Chief Complaint: OBESITY Janiha is here to discuss her progress with her obesity treatment plan. She is on the Category 4 plan and states she is following her eating plan approximately 70-75% of the time. She states she is walking 60-75 minutes 2 times per week and going to the gym/cardio 45-60 minutes 3-4 times per week.  Antoinetta believes that she has gained about 1 lb (weight 200). She has been traveling some, but is walking a lot.  Today's visit was #18 Starting weight: 241 lbs Starting date: 08/13/2018  Hypothyroidism (Stable) Sairah has a diagnosis of hypothyroidism, which is stable. She is taking Synthroid. Last TSH 1.05 on 05/08/2018. She reports fatigue and some cold intolerance.  Vitamin D deficiency (Improved) Shyane has a diagnosis of Vitamin D deficiency and is taking OTC Vitamin D. Last Vitamin D was 42.4 on 11/04/2018.  ASSESSMENT AND PLAN:  Other specified hypothyroidism  Vitamin D deficiency  Class 3 severe obesity with serious comorbidity and body mass index (BMI) of 40.0 to 44.9 in adult, unspecified obesity type ([place])  PLAN:  Hypothyroidism (Stable) Britnee was informed of the importance of good thyroid control for overall health and that supertheraputic thyroid levels are dangerous and will not improve weight loss results. Shaelene will continue her medications and will have TSH checked in the future.  Vitamin D Deficiency (Improved) Zanib was informed that low Vitamin D levels contributes to fatigue and are associated with obesity, breast, and colon cancer. She agrees to continue taking OTC Vit D and will follow-up for  routine testing of Vitamin D, at least 2-3 times per year. We discussed the importance of Vitamin D (helps the immune system). She was informed of the risk of over-replacement of Vitamin D and agrees to not increase her dose unless she discusses this with [name] first. Areyon agrees to follow-up with our clinic in 2-4 weeks.  Obesity Zannie is currently in the action stage of change. As such, her goal is to continue with weight loss efforts. She has agreed to follow the Category 4 plan. Ahmaya will work on meal planning, intentional eating, and will adhere more to the plan. Marlynn has been instructed to be more active and continue walking and going to the gym. for weight loss and overall health benefits. We discussed the following Behavioral Modification Strategies today: increasing lean protein intake, decreasing simple carbohydrates, increasing vegetables, increase H20 intake, decrease eating out, no skipping meals, work on meal planning and easy cooking plans, keeping healthy foods in the home, and planning for success.  Arloa has agreed to follow-up with our clinic in 2-4 weeks. She was informed of the importance of frequent follow-up visits to maximize her success with intensive lifestyle modifications for her multiple health conditions.  ALLERGIES: Allergies  Allergen Reactions  . Neulasta [Pegfilgrastim] Other (See Comments)    Raised lumps on legs, arms - had to be excised =  Happened every time pt received Neulasta.    MEDICATIONS: Current Outpatient Medications on File Prior to Visit  Medication Sig Dispense Refill  . acetaminophen (TYLENOL) 325 MG tablet Take 650 mg by mouth every 6 (six) hours as needed.    Marland Kitchen  Ascorbic Acid (VITAMIN C ER PO) Take 1 tablet by mouth daily.     Marland Kitchen aspirin 81 MG tablet Take 81 mg by mouth daily.    . AZOPT 1 % ophthalmic suspension PLACE 1 DROP INTO BOTH EYES TWICE DAILY  12  . b complex vitamins tablet Take 1 tablet by mouth daily.    Marland Kitchen escitalopram  (LEXAPRO) 20 MG tablet TAKE 1 & 1/2 TABLETS BY MOUTH DAILY 135 tablet 3  . levothyroxine (SYNTHROID) 75 MCG tablet TAKE 1 TABLET BY MOUTH EVERY DAY 90 tablet 1  . Multiple Vitamins-Minerals (MULTIVITAMIN ADULTS 50+ PO) Take 1 tablet by mouth daily.     . psyllium (METAMUCIL) 58.6 % powder Take 1 packet by mouth daily.     Marland Kitchen tolterodine (DETROL LA) 4 MG 24 hr capsule TAKE 1 CAPSULE BY MOUTH EVERY DAY 90 capsule 2  . Vitamin D, Cholecalciferol, 25 MCG (1000 UT) TABS Take 25 mcg by mouth.     No current facility-administered medications on file prior to visit.    PAST MEDICAL HISTORY: Past Medical History:  Diagnosis Date  . Anxiety    pt denies  . Arthritis    hands and feet  . Cancer ([place]) 10/04/2006   breast  . Cataracts, both eyes   . Depression   . Depression   . Diverticulosis of colon   . Glaucoma   . Heart murmur   . Joint pain   . Knee pain   . Obesity   . OSA on CPAP   . Osteoarthritis   . Sleep apnea   . Thyroid activity decreased     PAST SURGICAL HISTORY: Past Surgical History:  Procedure Laterality Date  . BREAST SURGERY Bilateral   . CATARACT EXTRACTION    . GLAUCOMA REPAIR    . MASTECTOMY, RADICAL Bilateral   . neulasta induced sterile abscesses    . THYROIDECTOMY, PARTIAL    . TOTAL KNEE ARTHROPLASTY Right     SOCIAL HISTORY: Social History   Tobacco Use  . Smoking status: Former Smoker    Quit date: 11/03/1978    Years since quitting: 40.5  . Smokeless tobacco: Never Used  Substance Use Topics  . Alcohol use: No  . Drug use: No    FAMILY HISTORY: Family History  Problem Relation Age of Onset  . CVA Mother   . Cancer Mother 66       breast cancer   . Heart disease Mother   . Stroke Mother   . Obesity Mother   . Leukemia Father   . High blood pressure Father   . High Cholesterol Father   . Heart disease Father   . Cancer Maternal Aunt 55       breast cancer   . Cancer Maternal Aunt 55       breast cancer  . Cancer Cousin 32        breast cancer   ROS: Review of Systems  Constitutional: Positive for malaise/fatigue.  Endo/Heme/Allergies:       Positive for some cold intolerance.   PHYSICAL EXAM: There were no vitals taken for this visit. There is no height or weight on file to calculate BMI. Physical Exam Vitals reviewed.  Constitutional:      Appearance: Normal appearance. She is obese.  Cardiovascular:     Rate and Rhythm: Normal rate.     Pulses: Normal pulses.  Pulmonary:     Effort: Pulmonary effort is normal.     Breath sounds:  Normal breath sounds.  Musculoskeletal:        General: Normal range of motion.  Skin:    General: Skin is warm and dry.  Neurological:     Mental Status: She is alert and oriented to person, place, and time.  Psychiatric:        Behavior: Behavior normal.   RECENT LABS AND TESTS: BMET    Component Value Date/Time   NA 142 11/04/2018 0925   NA 141 07/13/2016 1015   K 4.8 11/04/2018 0925   K 4.3 07/13/2016 1015   CL 104 11/04/2018 0925   CO2 25 11/04/2018 0925   CO2 24 07/13/2016 1015   GLUCOSE 89 11/04/2018 0925   GLUCOSE 78 07/31/2018 1013   GLUCOSE 89 07/13/2016 1015   BUN 17 11/04/2018 0925   BUN 17.2 07/13/2016 1015   CREATININE 0.78 11/04/2018 0925   CREATININE 0.7 07/13/2016 1015   CALCIUM 9.9 11/04/2018 0925   CALCIUM 9.6 07/13/2016 1015   GFRNONAA 78 11/04/2018 0925   GFRAA 90 11/04/2018 0925   Lab Results  Component Value Date   HGBA1C 5.4 11/04/2018   HGBA1C 5.6 08/13/2018   Lab Results  Component Value Date   INSULIN 10.9 11/04/2018   INSULIN 13.2 08/13/2018   CBC    Component Value Date/Time   WBC 4.6 07/31/2018 1013   RBC 4.38 07/31/2018 1013   HGB 12.1 07/31/2018 1013   HGB 12.2 07/13/2016 1015   HCT 39.0 07/31/2018 1013   HCT 36.9 07/13/2016 1015   PLT 190 07/31/2018 1013   PLT 183 07/13/2016 1015   MCV 89.0 07/31/2018 1013   MCV 84.3 07/13/2016 1015   MCH 27.6 07/31/2018 1013   MCHC 31.0 07/31/2018 1013   RDW 14.2  07/31/2018 1013   RDW 15.2 (H) 07/13/2016 1015   LYMPHSABS 1.3 07/31/2018 1013   LYMPHSABS 1.3 07/13/2016 1015   MONOABS 0.5 07/31/2018 1013   MONOABS 0.5 07/13/2016 1015   EOSABS 0.0 07/31/2018 1013   EOSABS 0.0 07/13/2016 1015   BASOSABS 0.0 07/31/2018 1013   BASOSABS 0.0 07/13/2016 1015   Iron/TIBC/Ferritin/ %Sat No results found for: IRON, TIBC, FERRITIN, IRONPCTSAT Lipid Panel     Component Value Date/Time   CHOL 170 05/08/2018 0957   TRIG 114.0 05/08/2018 0957   HDL 43.40 05/08/2018 0957   CHOLHDL 4 05/08/2018 0957   VLDL 22.8 05/08/2018 0957   LDLCALC 103 (H) 05/08/2018 0957   Hepatic Function Panel     Component Value Date/Time   PROT 6.5 11/04/2018 0925   PROT 6.7 07/13/2016 1015   ALBUMIN 4.4 11/04/2018 0925   ALBUMIN 4.0 07/13/2016 1015   AST 30 11/04/2018 0925   AST 22 07/13/2016 1015   ALT 35 (H) 11/04/2018 0925   ALT 28 07/13/2016 1015   ALKPHOS 94 11/04/2018 0925   ALKPHOS 70 07/13/2016 1015   BILITOT 0.4 11/04/2018 0925   BILITOT 0.57 07/13/2016 1015   BILIDIR 0.1 05/08/2018 0957      Component Value Date/Time   TSH 1.05 05/08/2018 0957   TSH 1.32 11/07/2017 0921   TSH 0.86 05/09/2017 1006     OBESITY BEHAVIORAL INTERVENTION VISIT DOCUMENTATION FOR INSURANCE (~15 minutes)    ASK: We discussed the diagnosis of obesity with [name] today and Gwenivere agreed to give [name] permission to discuss obesity behavioral modification therapy today.  ASSESS: Ndea has the diagnosis of obesity.  Wilmary is in the action stage of change.   ADVISE: [name] was educated on the multiple  health risks of obesity as well as the benefit of weight loss to improve her health. She was advised of the need for long term treatment and the importance of lifestyle modifications to improve her current health and to decrease her risk of future health problems.  AGREE: Multiple dietary modification options and treatment options were discussed and  Rocky agreed to follow  the recommendations documented in the above note.  ARRANGE: Jordin was educated on the importance of frequent visits to treat obesity as outlined per CMS and USPSTF guidelines and agreed to schedule her next follow up appointment today.  [name], am acting as Location manager for [employer], DO  I have reviewed the above documentation for accuracy and completeness, and I agree with the above. -Jearld Lesch, DO

## 2019-05-28 ENCOUNTER — Other Ambulatory Visit: Payer: Self-pay

## 2019-05-28 ENCOUNTER — Telehealth (INDEPENDENT_AMBULATORY_CARE_PROVIDER_SITE_OTHER): Payer: Medicare Other | Admitting: Bariatrics

## 2019-05-28 ENCOUNTER — Encounter (INDEPENDENT_AMBULATORY_CARE_PROVIDER_SITE_OTHER): Payer: Self-pay | Admitting: Bariatrics

## 2019-05-28 DIAGNOSIS — E8881 Metabolic syndrome: Secondary | ICD-10-CM

## 2019-05-28 DIAGNOSIS — Z6841 Body Mass Index (BMI) 40.0 and over, adult: Secondary | ICD-10-CM | POA: Diagnosis not present

## 2019-06-03 NOTE — Progress Notes (Signed)
Office: [phone]  /  Fax: [phone] TeleHealth Visit:  Vanessa Christensen has verbally consented to this TeleHealth visit today. The patient is located at home, the provider is located at the [facility] office. The participants in this visit include the listed provider and patient. The visit was conducted today via webex.  HPI:  Chief Complaint: OBESITY Vanessa Christensen is here to discuss her progress with her obesity treatment plan. She is on the Category 4 plan and states she is following her eating plan approximately 75-80 % of the time. She states she is doing cardio and strengthening for 45-60 minutes 3-4 times per week.  Vanessa Christensen states that she is up 1 lb. She has been doing well. Her eating plan has been good in the morning.  Insulin Resistance Vanessa Christensen has a diagnosis of insulin resistance. Last A1c was 5.4 and insulin of 10.7  ASSESSMENT AND PLAN:  Insulin resistance  Class 3 severe obesity with serious comorbidity and body mass index (BMI) of 40.0 to 44.9 in adult, unspecified obesity type ([place])  PLAN:  Insulin Resistance Vanessa Christensen will continue to work on weight loss, exercise, increasing healthy protein, and decreasing simple carbohydrates and sweets to help decrease the risk of diabetes. Vanessa Christensen agrees to follow-up with [name] as directed to closely monitor her progress.  Obesity Vanessa Christensen is currently in the action stage of change. As such, her goal is to continue with weight loss efforts. She has agreed to follow the Category 4 plan. Vanessa Christensen has been instructed to work up to a goal of 150 minutes of combined cardio and strengthening exercise per week or continue her current regimen for weight loss and overall health benefits. We discussed the following Behavioral Modification Strategies today: increasing lean protein intake, decreasing simple carbohydrates , increasing vegetables, increasing water intake, decreasing eating out, no skipping meals, meal planning and cooking  strategies, keeping healthy foods in the home and planning for success. Vanessa Christensen will weigh herself at home and will pay attention when she eats out. She is to drop her calories to 1500-1600.  Vanessa Christensen has agreed to follow-up with our clinic in 3 to 4 weeks. She was informed of the importance of frequent follow-up visits to maximize her success with intensive lifestyle modifications for her multiple health conditions.  ALLERGIES: Allergies  Allergen Reactions  . Neulasta [Pegfilgrastim] Other (See Comments)    Raised lumps on legs, arms - had to be excised =  Happened every time pt received Neulasta.    MEDICATIONS: Current Outpatient Medications on File Prior to Visit  Medication Sig Dispense Refill  . acetaminophen (TYLENOL) 325 MG tablet Take 650 mg by mouth every 6 (six) hours as needed.    . Ascorbic Acid (VITAMIN C ER PO) Take 1 tablet by mouth daily.     Marland Kitchen aspirin 81 MG tablet Take 81 mg by mouth daily.    . AZOPT 1 % ophthalmic suspension PLACE 1 DROP INTO BOTH EYES TWICE DAILY  12  . b complex vitamins tablet Take 1 tablet by mouth daily.    Marland Kitchen escitalopram (LEXAPRO) 20 MG tablet TAKE 1 & 1/2 TABLETS BY MOUTH DAILY 135 tablet 3  . levothyroxine (SYNTHROID) 75 MCG tablet TAKE 1 TABLET BY MOUTH EVERY DAY 90 tablet 1  . Multiple Vitamins-Minerals (MULTIVITAMIN ADULTS 50+ PO) Take 1 tablet by mouth daily.     . psyllium (METAMUCIL) 58.6 % powder Take 1 packet by mouth daily.     Marland Kitchen tolterodine (DETROL LA) 4 MG 24 hr  capsule TAKE 1 CAPSULE BY MOUTH EVERY DAY 90 capsule 2  . Vitamin D, Cholecalciferol, 25 MCG (1000 UT) TABS Take 25 mcg by mouth.     No current facility-administered medications on file prior to visit.    PAST MEDICAL HISTORY: Past Medical History:  Diagnosis Date  . Anxiety    pt denies  . Arthritis    hands and feet  . Cancer ([place]) 10/04/2006   breast  . Cataracts, both eyes   . Depression   . Depression   . Diverticulosis of colon   . Glaucoma   . Heart murmur    . Joint pain   . Knee pain   . Obesity   . OSA on CPAP   . Osteoarthritis   . Sleep apnea   . Thyroid activity decreased     PAST SURGICAL HISTORY: Past Surgical History:  Procedure Laterality Date  . BREAST SURGERY Bilateral   . CATARACT EXTRACTION    . GLAUCOMA REPAIR    . MASTECTOMY, RADICAL Bilateral   . neulasta induced sterile abscesses    . THYROIDECTOMY, PARTIAL    . TOTAL KNEE ARTHROPLASTY Right     SOCIAL HISTORY: Social History   Tobacco Use  . Smoking status: Former Smoker    Quit date: 11/03/1978    Years since quitting: 40.6  . Smokeless tobacco: Never Used  Substance Use Topics  . Alcohol use: No  . Drug use: No    FAMILY HISTORY: Family History  Problem Relation Age of Onset  . CVA Mother   . Cancer Mother 61       breast cancer   . Heart disease Mother   . Stroke Mother   . Obesity Mother   . Leukemia Father   . High blood pressure Father   . High Cholesterol Father   . Heart disease Father   . Cancer Maternal Aunt 55       breast cancer   . Cancer Maternal Aunt 55       breast cancer  . Cancer Cousin 32       breast cancer    ROS: Review of Systems  Constitutional: Negative for weight loss.    PHYSICAL EXAM: There were no vitals taken for this visit. There is no height or weight on file to calculate BMI. Physical Exam  RECENT LABS AND TESTS: BMET    Component Value Date/Time   NA 142 11/04/2018 0925   NA 141 07/13/2016 1015   K 4.8 11/04/2018 0925   K 4.3 07/13/2016 1015   CL 104 11/04/2018 0925   CO2 25 11/04/2018 0925   CO2 24 07/13/2016 1015   GLUCOSE 89 11/04/2018 0925   GLUCOSE 78 07/31/2018 1013   GLUCOSE 89 07/13/2016 1015   BUN 17 11/04/2018 0925   BUN 17.2 07/13/2016 1015   CREATININE 0.78 11/04/2018 0925   CREATININE 0.7 07/13/2016 1015   CALCIUM 9.9 11/04/2018 0925   CALCIUM 9.6 07/13/2016 1015   GFRNONAA 78 11/04/2018 0925   GFRAA 90 11/04/2018 0925   Lab Results  Component Value Date   HGBA1C  5.4 11/04/2018   HGBA1C 5.6 08/13/2018   Lab Results  Component Value Date   INSULIN 10.9 11/04/2018   INSULIN 13.2 08/13/2018   CBC    Component Value Date/Time   WBC 4.6 07/31/2018 1013   RBC 4.38 07/31/2018 1013   HGB 12.1 07/31/2018 1013   HGB 12.2 07/13/2016 1015   HCT 39.0 07/31/2018 1013  HCT 36.9 07/13/2016 1015   PLT 190 07/31/2018 1013   PLT 183 07/13/2016 1015   MCV 89.0 07/31/2018 1013   MCV 84.3 07/13/2016 1015   MCH 27.6 07/31/2018 1013   MCHC 31.0 07/31/2018 1013   RDW 14.2 07/31/2018 1013   RDW 15.2 (H) 07/13/2016 1015   LYMPHSABS 1.3 07/31/2018 1013   LYMPHSABS 1.3 07/13/2016 1015   MONOABS 0.5 07/31/2018 1013   MONOABS 0.5 07/13/2016 1015   EOSABS 0.0 07/31/2018 1013   EOSABS 0.0 07/13/2016 1015   BASOSABS 0.0 07/31/2018 1013   BASOSABS 0.0 07/13/2016 1015   Iron/TIBC/Ferritin/ %Sat No results found for: IRON, TIBC, FERRITIN, IRONPCTSAT Lipid Panel     Component Value Date/Time   CHOL 170 05/08/2018 0957   TRIG 114.0 05/08/2018 0957   HDL 43.40 05/08/2018 0957   CHOLHDL 4 05/08/2018 0957   VLDL 22.8 05/08/2018 0957   LDLCALC 103 (H) 05/08/2018 0957   Hepatic Function Panel     Component Value Date/Time   PROT 6.5 11/04/2018 0925   PROT 6.7 07/13/2016 1015   ALBUMIN 4.4 11/04/2018 0925   ALBUMIN 4.0 07/13/2016 1015   AST 30 11/04/2018 0925   AST 22 07/13/2016 1015   ALT 35 (H) 11/04/2018 0925   ALT 28 07/13/2016 1015   ALKPHOS 94 11/04/2018 0925   ALKPHOS 70 07/13/2016 1015   BILITOT 0.4 11/04/2018 0925   BILITOT 0.57 07/13/2016 1015   BILIDIR 0.1 05/08/2018 0957      Component Value Date/Time   TSH 1.05 05/08/2018 0957   TSH 1.32 11/07/2017 0921   TSH 0.86 05/09/2017 1006     OBESITY BEHAVIORAL INTERVENTION VISIT DOCUMENTATION FOR INSURANCE (~15 minutes)   ASK: We discussed the diagnosis of obesity with [name] today and Zo agreed to give [name] permission to discuss obesity behavioral modification therapy  today.  ASSESS: Vanessa Christensen has the diagnosis of obesity  Vanessa Christensen is in the action stage of change.   ADVISE: Vanessa Christensen was educated on the multiple health risks of obesity as well as the benefit of weight loss to improve her health. She was advised of the need for long term treatment and the importance of lifestyle modifications to improve her current health and to decrease her risk of future health problems.  AGREE: Multiple dietary modification options and treatment options were discussed and  Vanessa Christensen agreed to follow the recommendations documented in the above note.  ARRANGE: Vanessa Christensen was educated on the importance of frequent visits to treat obesity as outlined per CMS and USPSTF guidelines and agreed to schedule her next follow up appointment today.   Vanessa Christensen, am acting as transcriptionist for [employer], DO  I have reviewed the above documentation for accuracy and completeness, and I agree with the above. Matei Magnone A. [name], DO.

## 2019-06-04 ENCOUNTER — Encounter: Payer: Self-pay | Admitting: Family Medicine

## 2019-06-04 ENCOUNTER — Ambulatory Visit (INDEPENDENT_AMBULATORY_CARE_PROVIDER_SITE_OTHER): Payer: Medicare Other

## 2019-06-04 ENCOUNTER — Ambulatory Visit (INDEPENDENT_AMBULATORY_CARE_PROVIDER_SITE_OTHER): Payer: Medicare Other | Admitting: Family Medicine

## 2019-06-04 ENCOUNTER — Other Ambulatory Visit: Payer: Self-pay

## 2019-06-04 VITALS — BP 146/88 | HR 84 | Ht 62.0 in | Wt 202.0 lb

## 2019-06-04 VITALS — BP 122/82 | HR 66 | Temp 98.0°F | Resp 16 | Ht 62.0 in | Wt 202.1 lb

## 2019-06-04 DIAGNOSIS — M25571 Pain in right ankle and joints of right foot: Secondary | ICD-10-CM

## 2019-06-04 DIAGNOSIS — M25562 Pain in left knee: Secondary | ICD-10-CM | POA: Diagnosis not present

## 2019-06-04 DIAGNOSIS — E669 Obesity, unspecified: Secondary | ICD-10-CM

## 2019-06-04 DIAGNOSIS — M7989 Other specified soft tissue disorders: Secondary | ICD-10-CM

## 2019-06-04 DIAGNOSIS — M722 Plantar fascial fibromatosis: Secondary | ICD-10-CM

## 2019-06-04 DIAGNOSIS — R600 Localized edema: Secondary | ICD-10-CM | POA: Diagnosis not present

## 2019-06-04 DIAGNOSIS — E038 Other specified hypothyroidism: Secondary | ICD-10-CM

## 2019-06-04 LAB — TSH: TSH: 1.09 u[IU]/mL (ref 0.35–4.50)

## 2019-06-04 MED ORDER — MELOXICAM 15 MG PO TABS: 15.0000 mg | ORAL_TABLET | Freq: Every day | ORAL | 0 refills | Status: DC

## 2019-06-04 NOTE — Progress Notes (Signed)
Subjective:    I'm seeing this patient as a consultation for:  Dr. [name]. Note will be routed back to referring provider/PCP.  I, Wendy Poet, LAT, ATC, am serving as scribe for Dr. [name].  CC: R foot and R ankle pain  HPI: Pt is a 68 y/o female presenting w/ c/o of R foot and ankle pain.  She was seen earlier today by her PCP and referred to Sports Medicine.  Pt reports pain on the plantar aspect of her R foot when taking her first few steps in the morning.  She additionally notes swelling along her R lateral malleolus.  Pt was prescribed Meloxicam by her PCP and instructed to perform stretches and ice her R foot.  Pt notes that her pain begins at her R lateral malleolus and will then radiate into the R heel and pt notes that it feels like "needles are coming up through the bottom of her foot."  She rates her pain as a 4-5/10 on average.  Symptoms ongoing for less than a week.  She additionally notes pain at the anterior medial aspect of the left knee.  This is been present for a few days as well.  She denies any injury.  She has not had much treatment yet.  She notes that her activity level has changed recently.  She has been embarking on a weight loss program for several months now and is doing exercise regularly.  Overall she thinks this is very helpful.  Past medical history, Surgical history, Family history not pertinant except as noted below, Social history, Allergies, and medications have been entered into the medical record, reviewed, and no changes needed.   Review of Systems: No headache, visual changes, nausea, vomiting, diarrhea, constipation, dizziness, abdominal pain, skin rash, fevers, chills, night sweats, weight loss, swollen lymph nodes, body aches, joint swelling, muscle aches, chest pain, shortness of breath, mood changes, visual or auditory hallucinations.   Objective:    Vitals:   06/04/19 1430  BP: (!) 146/88  Pulse: 84  SpO2: 98%   General: Well Developed,  well nourished, and in no acute distress.  Neuro/Psych: Alert and oriented x3, extra-ocular muscles intact, able to move all 4 extremities, sensation grossly intact. Skin: Warm and dry, no rashes noted.  Respiratory: Not using accessory muscles, speaking in full sentences, trachea midline.  Cardiovascular: Pulses palpable, no extremity edema. Abdomen: Does not appear distended. MSK:  Right foot and ankle: Pes planus and bunion formation present.  Slight swelling at anterior lateral ankle present.  Otherwise normal-appearing Full range of motion. Stable ligamentous exam. Tender to palpation at ATFL region of ankle and plantar calcaneus mildly. Strength is intact to ankle motion. Pulses cap refill and sensation are intact distally.  Left knee: Mild effusion otherwise normal-appearing Range of motion 0-120 degrees without significant crepitation. Tender to palpation at proximal medial tibia joint line.  Nontender otherwise. Stable ligamentous exam. Intact strength. Minimally positive medial McMurray's test.   Lab and Radiology Results  Limited musculoskeletal ultrasound right foot and ankle Normal-appearing Achilles tendon with the exception of mild calcific changes at insertion of the calcaneus.  Achilles tendon is intact with no significant retrocalcaneal bursa swelling. Degenerative changes and spur formation along with moderate joint effusion present at anterior lateral ankle. Normal-appearing intact peroneal tendons Mild degenerative changes and mild effusion present at anterior medial ankle. Plantar fascia normal thickness no visible tear.  Small spur formation at plantar calcaneus as well. Impression:  Ankle DJD with moderate effusion  most prominent laterally. Chronic insertional Achilles calcific tendinitis mild. Mild plantar fasciitis with heel spur  Limited musculoskeletal ultrasound left anterior medial knee Degenerative osteophyte of the proximal medial tibia at area of  maximal tenderness medial knee.  Directly overlying the spur is the hamstring tendon crossing to the Pez anserine area.  Under dynamic ultrasound examination the hamstring tendon is impinging upon the small osteophyte. Impression: Osteophyte irritating medial hamstring tendon   X-ray right ankle images obtained today personally and independently reviewed Diffuse moderate degenerative changes.  Specifically at lateral ankle patient has significant osteophyte from the talus likely impacting the lateral malleolus and distal fibula with motion.  Osteophytes present at posterior and plantar calcaneus as well.  No acute fractures. Impression: Moderate degenerative changes ankle. Await formal radiology review  Impression and Recommendations:    Assessment and Plan: 68 y.o. female with  Lateral ankle pain and swelling.  Swelling due to effusion and pain and swelling secondary to degenerative changes including likely impinging osteophyte at the lateral ankle.  Functionally she has significant foot pronation which is exacerbating the impingement laterally.  Plan to treat with home exercise program, scaphoid pads, compressive ankle sleeve and trial of Voltaren gel.  Check back in a month.  Return sooner if needed.  Left medial knee pain.  Again patient has a osteophyte that under dynamic imaging is irritating the medial hamstring tendon structure.  This is been symptomatic for not very long as all.  It is likely that she will get significant benefit from simple topical Voltaren gel.  Check back in a month.  If not better next step would be ultrasound-guided injection of this area.  Check back 1 month or sooner if needed. Marland Kitchen  PDMP not reviewed this encounter. Orders Placed This Encounter  Procedures  . [name] - LOWER Extremity - Limited - RIGHT    Order Specific Question:   Reason for Exam (SYMPTOM  OR DIAGNOSIS REQUIRED)    Answer:   R ankle pain    Order Specific Question:   Preferred imaging location?     Answer:   [facility]  . DG Ankle Complete Right    Standing Status:   Future    Number of Occurrences:   1    Standing Expiration Date:   08/02/2020    Order Specific Question:   Reason for Exam (SYMPTOM  OR DIAGNOSIS REQUIRED)    Answer:   right lateral ankle pain    Order Specific Question:   Preferred imaging location?    Answer:   [facility]    Order Specific Question:   Release to patient    Answer:   Immediate    Order Specific Question:   Radiology Contrast Protocol - do NOT remove file path    Answer:   [email or web address]   No orders of the defined types were placed in this encounter.   Discussed warning signs or symptoms. Please see discharge instructions. Patient expresses understanding.   The above documentation has been reviewed and is accurate and complete [name]

## 2019-06-04 NOTE — Patient Instructions (Addendum)
Schedule your Medicare Wellness Visit at your convenience We'll notify you of your lab results and make any changes if needed We'll call you with your Sports Med appt Ice and roll the bottle under your foot twice daily START the Meloxicam once daily x7-10 days and then as needed Wear good supportive shoes Call with any questions or concerns Stay Safe!  Stay Healthy! Happy New Year!

## 2019-06-04 NOTE — Assessment & Plan Note (Signed)
Chronic problem.  Pt reports energy level will wax and wane.  Check labs.  Adjust meds prn

## 2019-06-04 NOTE — Patient Instructions (Addendum)
Thank you for coming in today. Use body helix full ankle sleeve. Neoprene.   Use over the counter voltaren gel on the ankle and knee 4x daily.  Use the scaphoid pads in your shoes.  Recheck in 4 weeks or sooner if needed.   If needed I can do an injection.   Do the heel raise.  Go from up to down slowly on a step. Pigeon toe.  10-30 reps

## 2019-06-04 NOTE — Assessment & Plan Note (Signed)
Pt is down 40 lbs since last visit.  Applauded her efforts and encouraged her to continue.

## 2019-06-04 NOTE — Progress Notes (Signed)
   Subjective:    Patient ID: Vanessa Christensen, female    DOB: 12-26-1950, 69 y.o.   MRN: [id number]  HPI Foot pain- R foot, around lateral malleolus is puffy.  Pt is much more active these days.  She also has pain on bottom of R foot.  First step is particularly painful.  Not using OTC medications.  Typically wears mules w/ some cushion.  Works out in socks at [employer].  Hypothyroid- chronic problem, on Levothyroxine 62mcg daily.  Pt has lost 40 lbs since last visit.  Denies changes to skin/hair/nails.  Energy level varies.  Obesity- pt is down 40 lbs since last visit.  [facility]  Is exercising regularly.   Review of Systems For ROS see HPI   This visit occurred during the SARS-CoV-2 public health emergency.  Safety protocols were in place, including screening questions prior to the visit, additional usage of staff PPE, and extensive cleaning of exam room while observing appropriate contact time as indicated for disinfecting solutions.       Objective:   Physical Exam Vitals reviewed.  Constitutional:      General: She is not in acute distress.    Appearance: She is well-developed. She is obese.  HENT:     Head: Normocephalic and atraumatic.  Eyes:     Conjunctiva/sclera: Conjunctivae normal.     Pupils: Pupils are equal, round, and reactive to light.  Neck:     Thyroid: No thyromegaly.  Cardiovascular:     Rate and Rhythm: Normal rate and regular rhythm.     Heart sounds: Normal heart sounds. No murmur.  Pulmonary:     Effort: Pulmonary effort is normal. No respiratory distress.     Breath sounds: Normal breath sounds.  Abdominal:     General: There is no distension.     Palpations: Abdomen is soft.     Tenderness: There is no abdominal tenderness.  Musculoskeletal:        General: Swelling (swelling and TTP over R anterior lateral malleolus consistent w/ cystic change or bursitis) and tenderness (TTP over insertion of plantar fascia on  R) present.     Cervical back: Normal range of motion and neck supple.  Lymphadenopathy:     Cervical: No cervical adenopathy.  Skin:    General: Skin is warm and dry.  Neurological:     Mental Status: She is alert and oriented to person, place, and time.  Psychiatric:        Behavior: Behavior normal.           Assessment & Plan:  Ankle soft tissue mass- new.  Consistent w/ either bursitis or cystic change.  Will refer to Sports Med for complete evaluation and likely [name].  Pt expressed understanding and is in agreement w/ plan.   Plantar fasciitis- new.  Reviewed dx and tx w/ pt.  She will ice and stretch and change her footwear.  Start Meloxicam once daily.  Reviewed supportive care and red flags that should prompt return.  Pt expressed understanding and is in agreement w/ plan.

## 2019-06-09 NOTE — Progress Notes (Signed)
X-rays shows medium arthritis but fortunately no fractures.  Some soft tissue swelling is present.

## 2019-06-24 ENCOUNTER — Other Ambulatory Visit: Payer: Self-pay

## 2019-06-24 ENCOUNTER — Telehealth (INDEPENDENT_AMBULATORY_CARE_PROVIDER_SITE_OTHER): Payer: Medicare Other | Admitting: Bariatrics

## 2019-06-24 ENCOUNTER — Encounter (INDEPENDENT_AMBULATORY_CARE_PROVIDER_SITE_OTHER): Payer: Self-pay | Admitting: Bariatrics

## 2019-06-24 DIAGNOSIS — E038 Other specified hypothyroidism: Secondary | ICD-10-CM | POA: Diagnosis not present

## 2019-06-24 DIAGNOSIS — M25562 Pain in left knee: Secondary | ICD-10-CM

## 2019-06-24 DIAGNOSIS — Z6835 Body mass index (BMI) 35.0-35.9, adult: Secondary | ICD-10-CM

## 2019-06-24 DIAGNOSIS — G8929 Other chronic pain: Secondary | ICD-10-CM

## 2019-06-24 NOTE — Progress Notes (Signed)
TeleHealth Visit:  Due to the COVID-19 pandemic, this visit was completed with telemedicine (audio/video) technology to reduce patient and provider exposure as well as to preserve personal protective equipment.   Vanessa Christensen has verbally consented to this TeleHealth visit. The patient is located at home, the provider is located at the [facility] office. The participants in this visit include the listed provider and patient. The visit was conducted today via Webex.  Chief Complaint: OBESITY Vanessa Christensen is here to discuss her progress with her obesity treatment plan along with follow-up of her obesity related diagnoses. Vanessa Christensen is on the Category 4 Plan and states she is following her eating plan approximately 70% of the time. Vanessa Christensen states she is going to the gym 45-60 minutes 4 times per week.  Today's visit was #: 20 Starting weight: 241 lbs Starting date: 08/13/2018  Interim History: Vanessa Christensen has gained 1 lb (weight 201). She states that she is sleeping better and feeling well from exercise.  Subjective:   Chronic pain of left knee. Vanessa Christensen is using a gel for pain and is taking Mobic.  Other specified hypothyroidism. Vanessa Christensen is taking Synthroid. No fatigue.  Lab Results  Component Value Date   TSH 1.09 06/04/2019   Assessment/Plan:   Chronic pain of left knee. [name] will continue exercise (no pounding exercise) and will take Mobic if needed.  Other specified hypothyroidism. Patient with long-standing hypothyroidism, on levothyroxine therapy. She appears euthyroid. Orders and follow up as documented in patient record. Vanessa Christensen will continue Synthroid. She was advised it was important to keep thyroid levels at acceptable levels.  Counseling . Good thyroid control is important for overall health. Supratherapeutic thyroid levels are dangerous and will not improve weight loss results. The correct way to take levothyroxine is fasting, with water, separated by at least 30  minutes from breakfast, and separated by more than 4 hours from calcium, iron, multivitamins, acid reflux medications (PPIs).   Class 2 severe obesity with serious comorbidity and body mass index (BMI) of 35.0 to 35.9 in adult, unspecified obesity type ([place]).  Vanessa Christensen is currently in the action stage of change. As such, her goal is to continue with weight loss efforts. She has agreed to the Category 3 Plan.   She will continue weighing herself at home. She will work on meal planning, intentional eating, increasing her water intake (will add Crystal Lite), and will have protein and calories for each meal.  Exercise goals: Vanessa Christensen will continue cardio and weights 45 minutes to 1 hour 4 times per week.  Behavioral modification strategies: increasing lean protein intake, decreasing simple carbohydrates, increasing vegetables, increasing water intake, decreasing eating out, no skipping meals, meal planning and cooking strategies and keeping healthy foods in the home.  Vanessa Christensen has agreed to follow-up with our clinic in 2 weeks. She was informed of the importance of frequent follow-up visits to maximize her success with intensive lifestyle modifications for her multiple health conditions.  Objective:   VITALS: Per patient if applicable, see vitals. GENERAL: Alert and in no acute distress. CARDIOPULMONARY: No increased WOB. Speaking in clear sentences.  PSYCH: Pleasant and cooperative. Speech normal rate and rhythm. Affect is appropriate. Insight and judgement are appropriate. Attention is focused, linear, and appropriate.  NEURO: Oriented as arrived to appointment on time with no prompting.   Lab Results  Component Value Date   CREATININE 0.78 11/04/2018   BUN 17 11/04/2018   NA 142 11/04/2018   K 4.8 11/04/2018   CL  104 11/04/2018   CO2 25 11/04/2018   Lab Results  Component Value Date   ALT 35 (H) 11/04/2018   AST 30 11/04/2018   ALKPHOS 94 11/04/2018   BILITOT 0.4 11/04/2018   Lab  Results  Component Value Date   HGBA1C 5.4 11/04/2018   HGBA1C 5.6 08/13/2018   Lab Results  Component Value Date   INSULIN 10.9 11/04/2018   INSULIN 13.2 08/13/2018   Lab Results  Component Value Date   TSH 1.09 06/04/2019   Lab Results  Component Value Date   CHOL 170 05/08/2018   HDL 43.40 05/08/2018   LDLCALC 103 (H) 05/08/2018   TRIG 114.0 05/08/2018   CHOLHDL 4 05/08/2018   Lab Results  Component Value Date   WBC 4.6 07/31/2018   HGB 12.1 07/31/2018   HCT 39.0 07/31/2018   MCV 89.0 07/31/2018   PLT 190 07/31/2018   No results found for: IRON, TIBC, FERRITIN  Attestation Statements:   Reviewed by clinician on day of visit: allergies, medications, problem list, medical history, surgical history, family history, social history, and previous encounter notes.  Time spent on visit including pre-visit chart review and post-visit care was 20 minutes.   [name], am acting as Location manager for [employer], DO   I have reviewed the above documentation for accuracy and completeness, and I agree with the above. Jearld Lesch, DO

## 2019-06-27 ENCOUNTER — Other Ambulatory Visit: Payer: Self-pay | Admitting: Family Medicine

## 2019-07-02 ENCOUNTER — Encounter: Payer: Self-pay | Admitting: Family Medicine

## 2019-07-02 ENCOUNTER — Ambulatory Visit (INDEPENDENT_AMBULATORY_CARE_PROVIDER_SITE_OTHER): Payer: Medicare Other | Admitting: Family Medicine

## 2019-07-02 ENCOUNTER — Other Ambulatory Visit: Payer: Self-pay

## 2019-07-02 ENCOUNTER — Ambulatory Visit (INDEPENDENT_AMBULATORY_CARE_PROVIDER_SITE_OTHER): Payer: Medicare Other

## 2019-07-02 VITALS — BP 128/76 | HR 79 | Ht 62.0 in | Wt 204.8 lb

## 2019-07-02 VITALS — BP 126/68 | HR 78 | Temp 98.1°F | Ht 62.0 in | Wt 202.8 lb

## 2019-07-02 DIAGNOSIS — M25562 Pain in left knee: Secondary | ICD-10-CM

## 2019-07-02 DIAGNOSIS — L03031 Cellulitis of right toe: Secondary | ICD-10-CM | POA: Diagnosis not present

## 2019-07-02 DIAGNOSIS — M25571 Pain in right ankle and joints of right foot: Secondary | ICD-10-CM

## 2019-07-02 DIAGNOSIS — Z0000 Encounter for general adult medical examination without abnormal findings: Secondary | ICD-10-CM | POA: Diagnosis not present

## 2019-07-02 MED ORDER — DOXYCYCLINE HYCLATE 100 MG PO TABS: 100.0000 mg | ORAL_TABLET | Freq: Two times a day (BID) | ORAL | 0 refills | Status: AC

## 2019-07-02 NOTE — Patient Instructions (Signed)
Vanessa Christensen , Thank you for taking time to come for your Medicare Wellness Visit. I appreciate your ongoing commitment to your health goals. Please review the following plan we discussed and let me know if I can assist you in the future.   Screening recommendations/referrals: Colorectal Screening: up to date; last colonoscopy 07/20/16 Mammogram: Not indicated  Bone Density: up to date; last 02/07/19  Vision and Dental Exams: Recommended annual ophthalmology exams for early detection of glaucoma and other disorders of the eye Recommended annual dental exams for proper oral hygiene  Vaccinations: Influenza vaccine: completed 02/02/19 Pneumococcal vaccine: Prevnar recommended; Pneumovax 23 received 03/2016 Tdap vaccine: up to date; last 12/01/13  Shingles vaccine: Shingrix completed   Advanced directives: We have received a copy of your POA (Power of [place]) and/or Living Will. These documents can be located in your chart.  Goals: Recommend to drink at least 6-8 8oz glasses of water per day and consume a balanced diet rich in fresh fruits and vegetables.   Next appointment: Please schedule your Annual Wellness Visit with your Nurse Health Advisor in one year.  Preventive Care 25 Years and Older, Female Preventive care refers to lifestyle choices and visits with your health care provider that can promote health and wellness. What does preventive care include?  A yearly physical exam. This is also called an annual well check.  Dental exams once or twice a year.  Routine eye exams. Ask your health care provider how often you should have your eyes checked.  Personal lifestyle choices, including:  Daily care of your teeth and gums.  Regular physical activity.  Eating a healthy diet.  Avoiding tobacco and drug use.  Limiting alcohol use.  Practicing safe sex.  Taking low-dose aspirin every day if recommended by your health care provider.  Taking vitamin and mineral supplements as  recommended by your health care provider. What happens during an annual well check? The services and screenings done by your health care provider during your annual well check will depend on your age, overall health, lifestyle risk factors, and family history of disease. Counseling  Your health care provider may ask you questions about your:  Alcohol use.  Tobacco use.  Drug use.  Emotional well-being.  Home and relationship well-being.  Sexual activity.  Eating habits.  History of falls.  Memory and ability to understand (cognition).  Work and work Statistician.  Reproductive health. Screening  You may have the following tests or measurements:  Height, weight, and BMI.  Blood pressure.  Lipid and cholesterol levels. These may be checked every 5 years, or more frequently if you are over 36 years old.  Skin check.  Lung cancer screening. You may have this screening every year starting at age 46 if you have a 30-pack-year history of smoking and currently smoke or have quit within the past 15 years.  Fecal occult blood test (FOBT) of the stool. You may have this test every year starting at age 57.  Flexible sigmoidoscopy or colonoscopy. You may have a sigmoidoscopy every 5 years or a colonoscopy every 10 years starting at age 31.  Hepatitis C blood test.  Hepatitis B blood test.  Sexually transmitted disease (STD) testing.  Diabetes screening. This is done by checking your blood sugar (glucose) after you have not eaten for a while (fasting). You may have this done every 1-3 years.  Bone density scan. This is done to screen for osteoporosis. You may have this done starting at age 10.  Mammogram.  This may be done every 1-2 years. Talk to your health care provider about how often you should have regular mammograms. Talk with your health care provider about your test results, treatment options, and if necessary, the need for more tests. Vaccines  Your health care  provider may recommend certain vaccines, such as:  Influenza vaccine. This is recommended every year.  Tetanus, diphtheria, and acellular pertussis (Tdap, Td) vaccine. You may need a Td booster every 10 years.  Zoster vaccine. You may need this after age 70.  Pneumococcal 13-valent conjugate (PCV13) vaccine. One dose is recommended after age 52.  Pneumococcal polysaccharide (PPSV23) vaccine. One dose is recommended after age 17. Talk to your health care provider about which screenings and vaccines you need and how often you need them. This information is not intended to replace advice given to you by your health care provider. Make sure you discuss any questions you have with your health care provider. Document Released: 06/18/2015 Document Revised: 02/09/2016 Document Reviewed: 03/23/2015 Elsevier Interactive Patient Education  2017 [place] Prevention in the Home Falls can cause injuries. They can happen to people of all ages. There are many things you can do to make your home safe and to help prevent falls. What can I do on the outside of my home?  Regularly fix the edges of walkways and driveways and fix any cracks.  Remove anything that might make you trip as you walk through a door, such as a raised step or threshold.  Trim any bushes or trees on the path to your home.  Use bright outdoor lighting.  Clear any walking paths of anything that might make someone trip, such as rocks or tools.  Regularly check to see if handrails are loose or broken. Make sure that both sides of any steps have handrails.  Any raised decks and porches should have guardrails on the edges.  Have any leaves, snow, or ice cleared regularly.  Use sand or salt on walking paths during winter.  Clean up any spills in your garage right away. This includes oil or grease spills. What can I do in the bathroom?  Use night lights.  Install grab bars by the toilet and in the tub and shower. Do  not use towel bars as grab bars.  Use non-skid mats or decals in the tub or shower.  If you need to sit down in the shower, use a plastic, non-slip stool.  Keep the floor dry. Clean up any water that spills on the floor as soon as it happens.  Remove soap buildup in the tub or shower regularly.  Attach bath mats securely with double-sided non-slip rug tape.  Do not have throw rugs and other things on the floor that can make you trip. What can I do in the bedroom?  Use night lights.  Make sure that you have a light by your bed that is easy to reach.  Do not use any sheets or blankets that are too big for your bed. They should not hang down onto the floor.  Have a firm chair that has side arms. You can use this for support while you get dressed.  Do not have throw rugs and other things on the floor that can make you trip. What can I do in the kitchen?  Clean up any spills right away.  Avoid walking on wet floors.  Keep items that you use a lot in easy-to-reach places.  If you need to reach something  above you, use a strong step stool that has a grab bar.  Keep electrical cords out of the way.  Do not use floor polish or wax that makes floors slippery. If you must use wax, use non-skid floor wax.  Do not have throw rugs and other things on the floor that can make you trip. What can I do with my stairs?  Do not leave any items on the stairs.  Make sure that there are handrails on both sides of the stairs and use them. Fix handrails that are broken or loose. Make sure that handrails are as long as the stairways.  Check any carpeting to make sure that it is firmly attached to the stairs. Fix any carpet that is loose or worn.  Avoid having throw rugs at the top or bottom of the stairs. If you do have throw rugs, attach them to the floor with carpet tape.  Make sure that you have a light switch at the top of the stairs and the bottom of the stairs. If you do not have them,  ask someone to add them for you. What else can I do to help prevent falls?  Wear shoes that:  Do not have high heels.  Have rubber bottoms.  Are comfortable and fit you well.  Are closed at the toe. Do not wear sandals.  If you use a stepladder:  Make sure that it is fully opened. Do not climb a closed stepladder.  Make sure that both sides of the stepladder are locked into place.  Ask someone to hold it for you, if possible.  Clearly mark and make sure that you can see:  Any grab bars or handrails.  First and last steps.  Where the edge of each step is.  Use tools that help you move around (mobility aids) if they are needed. These include:  Canes.  Walkers.  Scooters.  Crutches.  Turn on the lights when you go into a dark area. Replace any light bulbs as soon as they burn out.  Set up your furniture so you have a clear path. Avoid moving your furniture around.  If any of your floors are uneven, fix them.  If there are any pets around you, be aware of where they are.  Review your medicines with your doctor. Some medicines can make you feel dizzy. This can increase your chance of falling. Ask your doctor what other things that you can do to help prevent falls. This information is not intended to replace advice given to you by your health care provider. Make sure you discuss any questions you have with your health care provider. Document Released: 03/18/2009 Document Revised: 10/28/2015 Document Reviewed: 06/26/2014 Elsevier Interactive Patient Education  2017 [employer].

## 2019-07-02 NOTE — Patient Instructions (Addendum)
Thank you for coming in today.  I think the toe infection will get better on its own.  If it worsens fill and start the doxycycline antibiotic.  Paronychia  The ankle. Continue current treatment. Recheck if not improving or if worsening.   Continue the voltaren gel on the knee. Again return if needed.   Try using the scaphoid pad in the right shoe. Size small from [employer].   Paronychia Paronychia is an infection of the skin. It happens near a fingernail or toenail. It may cause pain and swelling around the nail. In some cases, a fluid-filled bump (abscess) can form near or under the nail. Usually, this condition is not serious, and it clears up with treatment. Follow these instructions at home: Wound care  Keep the affected area clean.  Soak the fingers or toes in warm water as told by your doctor. You may be told to do this for 20 minutes, 2-3 times a day.  Keep the area dry when you are not soaking it.  Do not try to drain a fluid-filled bump on your own.  Follow instructions from your doctor about how to take care of the affected area. Make sure you: ? Wash your hands with soap and water before you change your bandage (dressing). If you cannot use soap and water, use hand sanitizer. ? Change your bandage as told by your doctor.  If you had a fluid-filled bump and your doctor drained it, check the area every day for signs of infection. Check for: ? Redness, swelling, or pain. ? Fluid or blood. ? Warmth. ? Pus or a bad smell. Medicines   Take over-the-counter and prescription medicines only as told by your doctor.  If you were prescribed an antibiotic medicine, take it as told by your doctor. Do not stop taking it even if you start to feel better. General instructions  Avoid touching any chemicals.  Do not pick at the affected area. Prevention  To prevent this condition from happening again: ? Wear rubber gloves when putting your hands in water for washing dishes or  other tasks. ? Wear gloves if your hands might touch cleaners or chemicals. ? Avoid injuring your nails or fingertips. ? Do not bite your nails or tear hangnails. ? Do not cut your nails very short. ? Do not cut the skin at the base and sides of the nail (cuticles). ? Use clean nail clippers or scissors when trimming nails. Contact a doctor if:  You feel worse.  You do not get better.  You have more fluid, blood, or pus coming from the affected area.  Your finger or knuckle is swollen or is hard to move. Get help right away if you have:  A fever or chills.  Redness spreading from the affected area.  Pain in a joint or muscle. Summary  Paronychia is an infection of the skin. It happens near a fingernail or toenail.  This condition may cause pain and swelling around the nail.  Soak the fingers or toes in warm water as told by your doctor.  Usually, this condition is not serious, and it clears up with treatment. This information is not intended to replace advice given to you by your health care provider. Make sure you discuss any questions you have with your health care provider. Document Revised: 06/08/2017 Document Reviewed: 06/04/2017 Elsevier Patient Education  2020 [employer].

## 2019-07-02 NOTE — Progress Notes (Signed)
Subjective:   Vanessa Christensen is a 69 y.o. female who presents for Medicare Annual (Subsequent) preventive examination.  Review of Systems:   Cardiac Risk Factors include: advanced age (>59men, >16 women);obesity (BMI >30kg/m2)    Objective:     Vitals: BP 126/68   Pulse 78   Temp 98.1 F (36.7 C) (Temporal)   Ht 5\' 2"  (1.575 m)   Wt 202 lb 12.8 oz (92 kg)   SpO2 98%   BMI 37.09 kg/m   Body mass index is 37.09 kg/m.  Advanced Directives 07/02/2019 05/09/2017 07/13/2016 07/06/2016 05/03/2016 01/11/2016  Does Patient Have a Medical Advance Directive? Yes Yes Yes Yes Yes Yes  Type of Advance Directive Living will;Healthcare Power of Vanessa;Living will [place];Living will Living will [place];Living will -  Does patient want to make changes to medical advance directive? No - Patient declined - - - - -  Copy of [place] in Chart? Yes - validated most recent copy scanned in chart (See row information) Yes Yes - No - copy requested No - copy requested    Tobacco Social History   Tobacco Use  Smoking Status Former Smoker  . Quit date: 11/03/1978  . Years since quitting: 40.6  Smokeless Tobacco Never Used     Counseling given: Not Answered   Clinical Intake:  Pre-visit preparation completed: Yes  Diabetes: No  How often do you need to have someone help you when you read instructions, pamphlets, or other written materials from your doctor or pharmacy?: 1 - Never  Interpreter Needed?: No  Information entered by :: Denman George LPN  Past Medical History:  Diagnosis Date  . Anxiety    pt denies  . Arthritis    hands and feet  . Cancer ([place]) 10/04/2006   breast  . Cataracts, both eyes   . Depression   . Depression   . Diverticulosis of colon   . Glaucoma   . Heart murmur   . Joint pain   . Knee pain   . Obesity   . OSA on CPAP   . Osteoarthritis   . Sleep apnea   . Thyroid  activity decreased    Past Surgical History:  Procedure Laterality Date  . BREAST SURGERY Bilateral   . CATARACT EXTRACTION    . GLAUCOMA REPAIR    . MASTECTOMY, RADICAL Bilateral   . neulasta induced sterile abscesses    . THYROIDECTOMY, PARTIAL    . TOTAL KNEE ARTHROPLASTY Right    Family History  Problem Relation Age of Onset  . CVA Mother   . Cancer Mother 22       breast cancer   . Heart disease Mother   . Stroke Mother   . Obesity Mother   . Leukemia Father   . High blood pressure Father   . High Cholesterol Father   . Heart disease Father   . Cancer Maternal Aunt 55       breast cancer   . Cancer Maternal Aunt 55       breast cancer  . Cancer Cousin 32       breast cancer   Social History   Socioeconomic History  . Marital status: Married    Spouse name: Sista Christensen  . Number of children: 2  . Years of education: Not on file  . Highest education level: Not on file  Occupational History  . Occupation: Retired  Comment: Teacher  Tobacco Use  . Smoking status: Former Smoker    Quit date: 11/03/1978    Years since quitting: 40.6  . Smokeless tobacco: Never Used  Substance and Sexual Activity  . Alcohol use: No  . Drug use: No  . Sexual activity: Not Currently  Other Topics Concern  . Not on file  Social History Narrative   Enjoys taking care of grandchildren    Social Determinants of Health   Financial Resource Strain:   . Difficulty of Paying Living Expenses: Not on file  Food Insecurity:   . Worried About Charity fundraiser in the Last Year: Not on file  . Ran Out of Food in the Last Year: Not on file  Transportation Needs:   . Lack of Transportation (Medical): Not on file  . Lack of Transportation (Non-Medical): Not on file  Physical Activity:   . Days of Exercise per Week: Not on file  . Minutes of Exercise per Session: Not on file  Stress:   . Feeling of Stress : Not on file  Social Connections:   . Frequency of Communication with  Friends and Family: Not on file  . Frequency of Social Gatherings with Friends and Family: Not on file  . Attends Religious Services: Not on file  . Active Member of Clubs or Organizations: Not on file  . Attends Archivist Meetings: Not on file  . Marital Status: Not on file    Outpatient Encounter Medications as of 07/02/2019  Medication Sig  . acetaminophen (TYLENOL) 325 MG tablet Take 650 mg by mouth every 6 (six) hours as needed.  . Ascorbic Acid (VITAMIN C ER PO) Take 1 tablet by mouth daily.   Marland Kitchen aspirin 81 MG tablet Take 81 mg by mouth daily.  . AZOPT 1 % ophthalmic suspension PLACE 1 DROP INTO BOTH EYES TWICE DAILY  . b complex vitamins tablet Take 1 tablet by mouth daily.  Marland Kitchen escitalopram (LEXAPRO) 20 MG tablet TAKE 1 & 1/2 TABLETS BY MOUTH DAILY  . levothyroxine (SYNTHROID) 75 MCG tablet TAKE 1 TABLET BY MOUTH EVERY DAY  . meloxicam (MOBIC) 15 MG tablet TAKE 1 TABLET BY MOUTH EVERY DAY  . Multiple Vitamins-Minerals (MULTIVITAMIN ADULTS 50+ PO) Take 1 tablet by mouth daily.   . psyllium (METAMUCIL) 58.6 % powder Take 1 packet by mouth daily.   Marland Kitchen tolterodine (DETROL LA) 4 MG 24 hr capsule TAKE 1 CAPSULE BY MOUTH EVERY DAY  . Vitamin D, Cholecalciferol, 25 MCG (1000 UT) TABS Take 25 mcg by mouth.  . doxycycline (VIBRA-TABS) 100 MG tablet Take 1 tablet (100 mg total) by mouth 2 (two) times daily for 7 days. (Patient not taking: Reported on 07/02/2019)   No facility-administered encounter medications on file as of 07/02/2019.    Activities of Daily Living In your present state of health, do you have any difficulty performing the following activities: 07/02/2019 06/04/2019  Hearing? N N  Vision? N N  Difficulty concentrating or making decisions? N N  Walking or climbing stairs? N N  Dressing or bathing? N N  Doing errands, shopping? N N  Preparing Food and eating ? N -  Using the Toilet? N -  In the past six months, have you accidently leaked urine? N -  Do you have  problems with loss of bowel control? N -  Managing your Medications? N -  Managing your Finances? N -  Housekeeping or managing your Housekeeping? N -  Some recent data  might be hidden    Patient Care Team: [name], MD as PCP - General (Family Medicine) Katy Apo, MD as Consulting Physician (Ophthalmology) Truitt Merle, MD as Consulting Physician (Hematology) [place] (Dentistry) Alla Feeling, NP as Nurse Practitioner (Oncology) Gregor Hams, MD as Consulting Physician (Family Medicine) Ardis Hughs, MD as Consulting Physician (Urology) Georgia Lopes, DO as Consulting Physician [facility])    Assessment:   This is a routine wellness examination for Vanessa Christensen.  Exercise Activities and Dietary recommendations Current Exercise Habits: Structured exercise class, Type of exercise: strength training/weights;calisthenics, Time (Minutes): 45, Frequency (Times/Week): 4, Weekly Exercise (Minutes/Week): 180, Intensity: Moderate  Goals    . Weight (lb) < 185 lb (83.9 kg)     Will increase exercise throughout the week.     . Weight (lb) < 210 lb (95.3 kg)     Lose weight by monitoring portion control.        Fall Risk Fall Risk  06/04/2019 04/25/2019 05/08/2018 11/07/2017 05/09/2017  Falls in the past year? 0 1 0 No No  Comment - Emmi Telephone Survey: data to providers prior to load - - -  Number falls in past yr: 0 1 - - -  Comment - Emmi Telephone Survey Actual Response = 1 - - -  Injury with Fall? 0 0 - - -  Follow up Falls evaluation completed - - - -   Is the patient's home free of loose throw rugs in walkways, pet beds, electrical cords, etc?   yes      Grab bars in the bathroom? yes      Handrails on the stairs?   yes      Adequate lighting?   yes  Timed Get Up and Go performed: completed and within normal timeframe; no gait abnormalities noted   Depression Screen PHQ 2/9 Scores 07/02/2019 06/04/2019 12/02/2018 08/13/2018  PHQ - 2 Score 0 0 0 2  PHQ- 9  Score - 0 0 16  Exception Documentation - - - Medical reason     Cognitive Function MMSE - Mini Mental State Exam 05/09/2017  Orientation to time 5  Orientation to Place 5  Registration 3  Attention/ Calculation 5  Recall 3  Language- name 2 objects 2  Language- repeat 1  Language- follow 3 step command 3  Language- read & follow direction 1  Write a sentence 1  Copy design 1  Total score 30     6CIT Screen 07/02/2019  What Year? 0 points  What month? 0 points  What time? 0 points  Count back from 20 0 points  Months in reverse 0 points  Repeat phrase 0 points  Total Score 0    Immunization History  Administered Date(s) Administered  . Influenza, High Dose Seasonal PF 02/02/2019  . Influenza,inj,Quad PF,6+ Mos 04/16/2017, 02/01/2018  . Influenza-Unspecified 03/05/2016  . Pneumococcal-Unspecified 03/05/2016  . Tdap 12/01/2013  . Zoster 06/05/2008  . Zoster Recombinat (Shingrix) 09/07/2017, 01/25/2018    Qualifies for Shingles Vaccine?Shingrix completed   Screening Tests Health Maintenance  Topic Date Due  . PNA vac Low Risk Adult (2 of 2 - PCV13) 03/05/2017  . COLONOSCOPY  07/21/2019  . TETANUS/TDAP  12/02/2023  . INFLUENZA VACCINE  Completed  . DEXA SCAN  Completed  . Hepatitis C Screening  Completed    Cancer Screenings: Lung: Low Dose CT Chest recommended if Age 11-80 years, 30 pack-year currently smoking OR have quit w/in 15years. Patient does not qualify. Breast:  Up to date on Mammogram? Yes; not indicated due to bilateral mastectomy  Up to date of Bone Density/Dexa? Yes Colorectal: colonoscopy 07/20/16   Plan:  I have personally reviewed and addressed the Medicare Annual Wellness questionnaire and have noted the following in the patient's chart:  A. Medical and social history B. Use of alcohol, tobacco or illicit drugs  C. Current medications and supplements D. Functional ability and status E.  Nutritional status F.  Physical activity G. Advance  directives H. List of other physicians I.  Hospitalizations, surgeries, and ER visits in previous 12 months J.  [place] such as hearing and vision if needed, cognitive and depression L. Referrals, records requested, and appointments- will request vaccine records from pharmacy for Vanessa Christensen   In addition, I have reviewed and discussed with patient certain preventive protocols, quality metrics, and best practice recommendations. A written personalized care plan for preventive services as well as general preventive health recommendations were provided to patient.   Signed,  Denman George, LPN  Nurse Health Advisor   Nurse Notes: Patient education handouts provided on orthostatic hypotension

## 2019-07-02 NOTE — Progress Notes (Signed)
   I, Vanessa Christensen, LAT, ATC, am serving as scribe for Dr. [name].  Vanessa Christensen is a 69 y.o. female who presents to [facility] today for f/u of R ankle swelling at the lateral malleolus and plantar foot pain.  She was last seen by Dr. [name] on 06/04/19 and was shown Alfredson's exercises, given a schaphoid pad and provided w/ information on obtaining an ankle compression sleeve.  She also had a R ankle x-ray at her last visit.  Since her last visit, pt reports that she is feeling much better.  She con't to use the Voltaren gel and wears an ankle brace when she works out at [employer].  She rates her improvement at 80%.    Patient notes that she had a pedicure recently and developed red and pain at her third toe of her right foot that nail.  She notes this is improving a bit with Neosporin cream but was very painful at times.  She denies any drainage.  Overall she thinks she is improving.  She was also seen for left medial knee pain thought to be bony impingement at hamstring tendon.  This was treated with diclofenac gel.  She notes considerable improvement and is virtually pain-free.   Pertinent review of systems: No fevers or chills  Relevant historical information: Insulin resistance and obesity.   Exam:  BP 128/76 (BP Location: Left Arm, Patient Position: Sitting, Cuff Size: Large)   Pulse 79   Ht 5\' 2"  (1.575 m)   Wt 204 lb 12.8 oz (92.9 kg)   SpO2 98%   BMI 37.46 kg/m  General: Well Developed, well nourished, and in no acute distress.   MSK:  Right ankle: Relatively normal appearing.  Some pronation with standing. Foot and ankle motion normal. Right foot erythema at proximal and lateral third nail border.  Mildly tender palpation no fluctuance.  No expressible pus. Left knee nontender normal motion.     Assessment and Plan: 69 y.o. female with  Right ankle pain: Overall significant improvement with conservative management.  Plan to modify  shoes to include scaphoid pads.  Continue exercise, Voltaren gel, and compressive ankle sleeve.  Recheck as needed.  Right third toe paronychia: Improving.  Likely no further treatment needed however we will proceed with watchful waiting.  Backup prescription of doxycycline sent to pharmacy to fill if worsening or not improving.  Left knee pain: Much improved.  Watchful waiting.   PDMP not reviewed this encounter. No orders of the defined types were placed in this encounter.  Meds ordered this encounter  Medications  . doxycycline (VIBRA-TABS) 100 MG tablet    Sig: Take 1 tablet (100 mg total) by mouth 2 (two) times daily for 7 days.    Dispense:  14 tablet    Refill:  0     Discussed warning signs or symptoms. Please see discharge instructions. Patient expresses understanding.   The above documentation has been reviewed and is accurate and complete [name]

## 2019-07-07 ENCOUNTER — Other Ambulatory Visit: Payer: Self-pay | Admitting: Family Medicine

## 2019-07-08 ENCOUNTER — Other Ambulatory Visit: Payer: Self-pay

## 2019-07-08 ENCOUNTER — Encounter (INDEPENDENT_AMBULATORY_CARE_PROVIDER_SITE_OTHER): Payer: Self-pay | Admitting: Bariatrics

## 2019-07-08 ENCOUNTER — Telehealth (INDEPENDENT_AMBULATORY_CARE_PROVIDER_SITE_OTHER): Payer: Medicare Other | Admitting: Bariatrics

## 2019-07-08 DIAGNOSIS — E8881 Metabolic syndrome: Secondary | ICD-10-CM | POA: Diagnosis not present

## 2019-07-08 DIAGNOSIS — E559 Vitamin D deficiency, unspecified: Secondary | ICD-10-CM | POA: Diagnosis not present

## 2019-07-08 DIAGNOSIS — Z6835 Body mass index (BMI) 35.0-35.9, adult: Secondary | ICD-10-CM | POA: Diagnosis not present

## 2019-07-08 NOTE — Progress Notes (Signed)
TeleHealth Visit:  Due to the COVID-19 pandemic, this visit was completed with telemedicine (audio/video) technology to reduce patient and provider exposure as well as to preserve personal protective equipment.   Kindel Winkfield has verbally consented to this TeleHealth visit. The patient is located at home, the provider is located at the [facility] office. The participants in this visit include the listed provider and patient. The visit was conducted today via Skype.  Chief Complaint: OBESITY Astria is here to discuss her progress with her obesity treatment plan along with follow-up of her obesity related diagnoses. Riya is on the Category 4 Plan and states she is following her eating plan approximately 80% of the time. Prairie states she is doing strength/cardio 45-60 minutes 4-5 times per week.  Today's visit was #: 21 Starting weight: 241 lbs Starting date: 08/13/2018  Interim History: Colbey states that she has lost 1 lb (weight 199). She states that she has been making good choices. She was named the Best boy of the Month."   Subjective:   Insulin resistance. Norina has a diagnosis of insulin resistance based on her elevated fasting insulin level >5. She continues to work on diet and exercise to decrease her risk of diabetes.  Lab Results  Component Value Date   INSULIN 10.9 11/04/2018   INSULIN 13.2 08/13/2018   Lab Results  Component Value Date   HGBA1C 5.4 11/04/2018   Vitamin D deficiency. Emanuel is taking OTC Vitamin D. Last Vitamin D level 42.4 on 11/04/2018.  Assessment/Plan:   Insulin resistance. Vianny will continue to work on weight loss, exercise, increasing healthy fats and protein, decreasing calories, and decreasing simple carbohydrates to help decrease the risk of diabetes. Vetra agreed to follow-up with [name] as directed to closely monitor her progress.  Vitamin D deficiency. Low Vitamin D level contributes to fatigue and are associated with  obesity, breast, and colon cancer. She agrees to continue to take OTC Vitamin D and will follow-up for routine testing of Vitamin D, at least 2-3 times per year to avoid over-replacement.  Class 2 severe obesity with serious comorbidity and body mass index (BMI) of 35.0 to 35.9 in adult, unspecified obesity type ([place]).  Makinna is currently in the action stage of change. As such, her goal is to continue with weight loss efforts. She has agreed to the Category 3 Plan (changed from Category 4 to Category 3 to decrease calories).  She will work on meal planning. We discussed snacking.  Exercise goals: Orris will continue exercising as above. She will wear a brace on her right ankle.  Behavioral modification strategies: increasing lean protein intake, decreasing simple carbohydrates, increasing vegetables, increasing water intake, decreasing eating out, no skipping meals, meal planning and cooking strategies and keeping healthy foods in the home.  Quinnie has agreed to follow-up with our clinic in 2 weeks. She was informed of the importance of frequent follow-up visits to maximize her success with intensive lifestyle modifications for her multiple health conditions.  Objective:   VITALS: Per patient if applicable, see vitals. GENERAL: Alert and in no acute distress. CARDIOPULMONARY: No increased WOB. Speaking in clear sentences.  PSYCH: Pleasant and cooperative. Speech normal rate and rhythm. Affect is appropriate. Insight and judgement are appropriate. Attention is focused, linear, and appropriate.  NEURO: Oriented as arrived to appointment on time with no prompting.   Lab Results  Component Value Date   CREATININE 0.78 11/04/2018   BUN 17 11/04/2018   NA 142 11/04/2018  K 4.8 11/04/2018   CL 104 11/04/2018   CO2 25 11/04/2018   Lab Results  Component Value Date   ALT 35 (H) 11/04/2018   AST 30 11/04/2018   ALKPHOS 94 11/04/2018   BILITOT 0.4 11/04/2018   Lab Results  Component  Value Date   HGBA1C 5.4 11/04/2018   HGBA1C 5.6 08/13/2018   Lab Results  Component Value Date   INSULIN 10.9 11/04/2018   INSULIN 13.2 08/13/2018   Lab Results  Component Value Date   TSH 1.09 06/04/2019   Lab Results  Component Value Date   CHOL 170 05/08/2018   HDL 43.40 05/08/2018   LDLCALC 103 (H) 05/08/2018   TRIG 114.0 05/08/2018   CHOLHDL 4 05/08/2018   Lab Results  Component Value Date   WBC 4.6 07/31/2018   HGB 12.1 07/31/2018   HCT 39.0 07/31/2018   MCV 89.0 07/31/2018   PLT 190 07/31/2018   No results found for: IRON, TIBC, FERRITIN  Attestation Statements:   Reviewed by clinician on day of visit: allergies, medications, problem list, medical history, surgical history, family history, social history, and previous encounter notes.  Time spent on visit including pre-visit chart review and post-visit care was 20 minutes.   [name], am acting as Location manager for [employer], DO   I have reviewed the above documentation for accuracy and completeness, and I agree with the above. Jearld Lesch, DO

## 2019-07-18 DIAGNOSIS — H401131 Primary open-angle glaucoma, bilateral, mild stage: Secondary | ICD-10-CM | POA: Diagnosis not present

## 2019-07-19 ENCOUNTER — Ambulatory Visit: Payer: Medicare Other | Attending: Internal Medicine

## 2019-07-19 DIAGNOSIS — Z23 Encounter for immunization: Secondary | ICD-10-CM | POA: Insufficient documentation

## 2019-07-19 NOTE — Progress Notes (Signed)
   Covid-19 Vaccination Clinic  Name:  Vanessa Christensen    MRN: [id number] DOB: 03-Aug-1950  07/19/2019  [name] was observed post Covid-19 immunization for 15 minutes without incidence. She was provided with Vaccine Information Sheet and instruction to access the V-Safe system.   [name] was instructed to call 911 with any severe reactions post vaccine: Marland Kitchen Difficulty breathing  . Swelling of your face and throat  . A fast heartbeat  . A bad rash all over your body  . Dizziness and weakness    Immunizations Administered    Name Date Dose VIS Date Route   Pfizer COVID-19 Vaccine 07/19/2019  2:12 PM 0.3 mL 05/16/2019 Intramuscular   Manufacturer: [place]   Lot: [id number]   [place]: [id number]

## 2019-07-22 ENCOUNTER — Encounter (INDEPENDENT_AMBULATORY_CARE_PROVIDER_SITE_OTHER): Payer: Self-pay | Admitting: Bariatrics

## 2019-07-22 ENCOUNTER — Telehealth (INDEPENDENT_AMBULATORY_CARE_PROVIDER_SITE_OTHER): Payer: Medicare Other | Admitting: Bariatrics

## 2019-07-22 ENCOUNTER — Other Ambulatory Visit: Payer: Self-pay

## 2019-07-22 DIAGNOSIS — Z6837 Body mass index (BMI) 37.0-37.9, adult: Secondary | ICD-10-CM | POA: Diagnosis not present

## 2019-07-22 DIAGNOSIS — E038 Other specified hypothyroidism: Secondary | ICD-10-CM | POA: Diagnosis not present

## 2019-07-22 DIAGNOSIS — E559 Vitamin D deficiency, unspecified: Secondary | ICD-10-CM

## 2019-07-22 NOTE — Progress Notes (Signed)
TeleHealth Visit:  Due to the COVID-19 pandemic, this visit was completed with telemedicine (audio/video) technology to reduce patient and provider exposure as well as to preserve personal protective equipment.   Greidys Tolleson has verbally consented to this TeleHealth visit. The patient is located at home, the provider is located at the [facility] office. The participants in this visit include the listed provider and patient. The visit was conducted today via Skype.  Chief Complaint: OBESITY Shaunetta is here to discuss her progress with her obesity treatment plan along with follow-up of her obesity related diagnoses. Darlene is on the Category 3 Plan and states she is following her eating plan approximately 50% of the time. Emerson states she is doing cardio 45-60 minutes 4-5 times per week.  Today's visit was #: 22 Starting weight: 241 lbs Starting date: 08/13/2018  Interim History: Asenat is down 1 lb (weight 198). She has struggled slightly with the power outage. She has a tendency to pick up and nibble on various foods. She reports doing better with her water intake.  Subjective:   Other specified hypothyroidism. This is controlled.   Lab Results  Component Value Date   TSH 1.09 06/04/2019   Vitamin D deficiency. Daniele is taking Vitamin D. Last Vitamin D level 42.4 on 11/04/2018.  Assessment/Plan:   Other specified hypothyroidism. Patient with long-standing hypothyroidism, on levothyroxine therapy. She appears euthyroid. Orders and follow up as documented in patient record. Razia will continue medications as directed.  Counseling . Good thyroid control is important for overall health. Supratherapeutic thyroid levels are dangerous and will not improve weight loss results.  . The correct way to take levothyroxine is fasting, with water, separated by at least 30 minutes from breakfast, and separated by more than 4 hours from calcium, iron, multivitamins, acid  reflux medications (PPIs).   Vitamin D deficiency. Low Vitamin D level contributes to fatigue and are associated with obesity, breast, and colon cancer. She agrees to continue to take OTC Vitamin D to maintain levels and will follow-up for routine testing of Vitamin D, at least 2-3 times per year to avoid over-replacement.  Class 2 severe obesity with serious comorbidity and body mass index (BMI) of 37.0 to 37.9 in adult, unspecified obesity type ([place]).  Miray is currently in the action stage of change. As such, her goal is to continue with weight loss efforts. She has agreed to the Category 3 Plan.   She will work on meal planning, intentional eating, will weigh herself before each visit, and will start waning down her food.  Exercise goals: Jackelinne will continue cardio 45-60 minutes 4-5 times per week.  Behavioral modification strategies: increasing lean protein intake, decreasing simple carbohydrates, increasing vegetables, increasing water intake, decreasing eating out, no skipping meals, meal planning and cooking strategies, keeping healthy foods in the home and planning for success.  Verlisa has agreed to follow-up with our clinic in 2 weeks. She was informed of the importance of frequent follow-up visits to maximize her success with intensive lifestyle modifications for her multiple health conditions.  Objective:   VITALS: Per patient if applicable, see vitals. GENERAL: Alert and in no acute distress. CARDIOPULMONARY: No increased WOB. Speaking in clear sentences.  PSYCH: Pleasant and cooperative. Speech normal rate and rhythm. Affect is appropriate. Insight and judgement are appropriate. Attention is focused, linear, and appropriate.  NEURO: Oriented as arrived to appointment on time with no prompting.   Lab Results  Component Value Date   CREATININE  0.78 11/04/2018   BUN 17 11/04/2018   NA 142 11/04/2018   K 4.8 11/04/2018   CL 104 11/04/2018   CO2 25 11/04/2018   Lab Results   Component Value Date   ALT 35 (H) 11/04/2018   AST 30 11/04/2018   ALKPHOS 94 11/04/2018   BILITOT 0.4 11/04/2018   Lab Results  Component Value Date   HGBA1C 5.4 11/04/2018   HGBA1C 5.6 08/13/2018   Lab Results  Component Value Date   INSULIN 10.9 11/04/2018   INSULIN 13.2 08/13/2018   Lab Results  Component Value Date   TSH 1.09 06/04/2019   Lab Results  Component Value Date   CHOL 170 05/08/2018   HDL 43.40 05/08/2018   LDLCALC 103 (H) 05/08/2018   TRIG 114.0 05/08/2018   CHOLHDL 4 05/08/2018   Lab Results  Component Value Date   WBC 4.6 07/31/2018   HGB 12.1 07/31/2018   HCT 39.0 07/31/2018   MCV 89.0 07/31/2018   PLT 190 07/31/2018   No results found for: IRON, TIBC, FERRITIN  Attestation Statements:   Reviewed by clinician on day of visit: allergies, medications, problem list, medical history, surgical history, family history, social history, and previous encounter notes.  Time spent on visit including pre-visit chart review and and post-chart care was 20 minutes.   [name], am acting as Location manager for [employer], DO   I have reviewed the above documentation for accuracy and completeness, and I agree with the above. Jearld Lesch, DO

## 2019-07-30 NOTE — Progress Notes (Addendum)
[place]   [phone]) [phone] [phone]   Clinic Follow up Note   Patient Care Team: Vanessa Minium, MD as PCP - General (Family Medicine) Vanessa Apo, MD as Consulting Physician (Ophthalmology) Vanessa Merle, MD as Consulting Physician (Hematology) [place] (Dentistry) Vanessa Feeling, NP as Nurse Practitioner (Oncology) Vanessa Hams, MD as Consulting Physician (Family Medicine) Vanessa Hughs, MD as Consulting Physician (Urology) Vanessa Lopes, DO as Consulting Physician (Bariatrics) 07/31/2019  CHIEF COMPLAINT: F/u left breast cancer   SUMMARY OF ONCOLOGIC HISTORY: Oncology History Overview Note  Breast cancer of upper-outer quadrant of left female breast [facility])   Staging form: Breast, AJCC 7th Edition   - Pathologic stage from 10/31/2006: Stage IIB (T2, N20m, cM0) - Signed by YTruitt Merle MD on 01/11/2016    Breast cancer of upper-outer quadrant of left female breast ([place]  09/25/2006 Initial Biopsy   Left breast 2:00 position mass core needle biopsy showed invasive carcinoma with overlapping features of lobular and ductal carcinoma, grade 2.   09/25/2006 Receptors her2   ER 3+ positive, PR 3+ positive, HER-2 equivocal on IHC, [facility] of [facility].   09/25/2006 Initial Diagnosis   Breast cancer of upper-outer quadrant of left female breast ([place]   10/01/2006 Miscellaneous   BRCA1 and BRCA2 gene sequencing was negative for mutation.   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   10/2006 - 10/2016 Anti-estrogen oral therapy   Adjuvant anastrozole 182mdaily    01/10/2007 - 08/08/2007 Chemotherapy   Adriamycin 1203mnd Cytoxan  1200m45mery 4 weeks for 4 cycles, followed by docetaxel 150mg64m 2 cycles.      09/09/2008 Imaging   PET scan showed no evidence of recurrence or metastatic breast cancer.   01/19/2016 Imaging   Bone Density Scan The BMD measured at Femur Neck Right is 0.973 g/cm2 with a T-score of -0.5.   Genetic testing  08/08/2016 Initial Diagnosis   Genetic testing was negative for pathogenic variants in the 80 genes on the Multi-Gene Panel offered by [facility], included sequencing and/or deletion duplication testing of the following genes: ALK, APC, ATM, AXIN2,BAP1,  BARD1, BLM, BMPR1A, BRCA1, BRCA2, BRIP1, CASR, CDC73, CDH1, CDK4, CDKN1B, CDKN1C, CDKN2A (p14ARF), CDKN2A (p16INK4a), CEBPA, CHEK2, DICER1, CIS3L2, EGFR (c.2369C>T, p.Thr790Met variant only), EPCAM (Deletion/duplication testing only), FH, FLCN, GATA2, GPC3, GREM1 (Promoter region deletion/duplication testing only), HOXB13 (c.251G>A, p.Gly84Glu), HRAS, KIT, MAX, MEN1, MET, MITF (c.952G>A, p.Glu318Lys variant only), MLH1, MSH2, MSH6, MUTYH, NBN, NF1, NF2, PALB2, PDGFRA, PHOX2B, PMS2, POLD1, POLE, POT1, PRKAR1A, PTCH1, PTEN, RAD50, RAD51C, RAD51D, RB1, RECQL4, RET, RUNX1, SDHAF2, SDHA (sequence changes only), SDHB, SDHC, SDHD, SMAD4, SMARCA4, SMARCB1, SMARCE1, STK11, SUFU, TERT, TERT, TMEM127, TP53, TSC1, TSC2, VHL, WRN and WT1. Date of test report is 08/04/16.  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 No medical decisions should be made based on this result at this time given its unknown clinical significance.      CURRENT THERAPY:  Surveillance   INTERVAL HISTORY: [name] for f/u as scheduled. She was last seen 07/31/2018. She is Christensen great, has lost around 45 lbs intentionally through weight management program. She strained a muscle in her right  shoulder when she lifted too much weight during exercise. She is resting it now. Denies other bone or joint pain. Energy level is great. Denies recent  infection. Denies change in bowel habits, abdominal pain/bloating, or rectal/vaginal bleeding. Denies cough, chest pain, dyspnea, or leg swelling. Denies concerns in her breast/chest wall.   MEDICAL HISTORY:  Past Medical History:  Diagnosis Date  . Anxiety    pt denies  . Arthritis    hands and feet  . Cancer ([place]) 10/04/2006   breast  . Cataracts, both eyes   . Depression   . Depression   . Diverticulosis of colon   . Glaucoma   . Heart murmur   . Joint pain   . Knee pain   . Obesity   . OSA on CPAP   . Osteoarthritis   . Sleep apnea   . Thyroid activity decreased     SURGICAL HISTORY: Past Surgical History:  Procedure Laterality Date  . BREAST SURGERY Bilateral   . CATARACT EXTRACTION    . GLAUCOMA REPAIR    . MASTECTOMY, RADICAL Bilateral   . neulasta induced sterile abscesses    . THYROIDECTOMY, PARTIAL    . TOTAL KNEE ARTHROPLASTY Right     I have reviewed the social history and family history with the patient and they are unchanged from previous note.  ALLERGIES:  is allergic to neulasta [pegfilgrastim].  MEDICATIONS:  Current Outpatient Medications  Medication Sig Dispense Refill  . acetaminophen (TYLENOL) 325 MG tablet Take 650 mg by mouth every 6 (six) hours as needed.    . Ascorbic Acid (VITAMIN C ER PO) Take 1 tablet by mouth daily.     Marland Kitchen aspirin 81 MG tablet Take 81 mg by mouth daily.    . AZOPT 1 % ophthalmic suspension PLACE 1 DROP INTO BOTH EYES TWICE DAILY  12  . b complex vitamins tablet Take 1 tablet by mouth daily.    Marland Kitchen escitalopram (LEXAPRO) 20 MG tablet TAKE 1 & 1/2 TABLETS BY MOUTH DAILY 135 tablet 3  . levothyroxine (SYNTHROID) 75 MCG tablet TAKE 1 TABLET BY MOUTH EVERY DAY 90 tablet 1  . Multiple Vitamins-Minerals (MULTIVITAMIN ADULTS 50+ PO) Take 1 tablet by mouth daily.     . psyllium (METAMUCIL) 58.6 % powder Take 1 packet by mouth daily.     Marland Kitchen tolterodine (DETROL LA) 4 MG 24 hr capsule TAKE 1 CAPSULE BY MOUTH EVERY DAY 90 capsule 2  .  Vitamin D, Cholecalciferol, 25 MCG (1000 UT) TABS Take 25 mcg by mouth.    . meloxicam (MOBIC) 15 MG tablet TAKE 1 TABLET BY MOUTH EVERY DAY 30 tablet 0   No current facility-administered medications for this visit.    PHYSICAL EXAMINATION: ECOG PERFORMANCE STATUS: 0 - Asymptomatic  Vitals:   07/31/19 1136  BP: (!) 143/74  Pulse: 79  Resp: 18  Temp: 97.8 F (36.6 C)  SpO2: 98%   Filed Weights   07/31/19 1136  Weight: 202 lb 12.8 oz (92 kg)    GENERAL:alert, no distress and comfortable SKIN: no rash  EYES:  sclera clear LYMPH:  no palpable cervical or supraclavicular lymphadenopathy LUNGS: clear with normal breathing effort HEART: regular rate & rhythm, no lower extremity edema ABDOMEN: abdomen soft, non-tender and normal bowel sounds Musculoskeletal: no focal tenderness  NEURO: alert & oriented x 3 with fluent speech, normal gait Breast exam: s/p bilat mastectomy. Prominent tissue with scattered scar tissue bilaterally, otherwise no mass or nodularity including axilla that I  could appreciate  LABORATORY DATA:  I have reviewed the data as listed CBC Latest Ref Rng & Units 07/31/2019 07/31/2018 05/08/2018  WBC 4.0 - 10.5 K/uL 5.5 4.6 4.7  Hemoglobin 12.0 - 15.0 g/dL 12.0 12.1 12.4  Hematocrit 36.0 - 46.0 % 38.3 39.0 37.3  Platelets 150 - 400 K/uL 167 190 178.0     CMP Latest Ref Rng & Units 07/31/2019 11/04/2018 07/31/2018  Glucose 70 - 99 mg/dL 88 89 78  BUN 8 - 23 mg/dL 23 17 14   Creatinine 0.44 - 1.00 mg/dL 0.75 0.78 0.80  Sodium 135 - 145 mmol/L 141 142 140  Potassium 3.5 - 5.1 mmol/L 4.6 4.8 4.5  Chloride 98 - 111 mmol/L 107 104 105  CO2 22 - 32 mmol/L 26 25 26   Calcium 8.9 - 10.3 mg/dL 9.2 9.9 9.5  Total Protein 6.5 - 8.1 g/dL 6.9 6.5 7.3  Total Bilirubin 0.3 - 1.2 mg/dL 0.4 0.4 0.5  Alkaline Phos 38 - 126 U/L 69 94 87  AST 15 - 41 U/L 18 30 24   ALT 0 - 44 U/L 22 35(H) 27      RADIOGRAPHIC STUDIES: I have personally reviewed the radiological images as  listed and agreed with the findings in the report. No results found.   ASSESSMENT & PLAN: Vanessa Christensen is a 69 y.o. female with   1. 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 She is s/p b/l breast mastectomy, adjuvant chemo AC, and anti-estrogen therapy with Anastrozole for 10 years.  -Ms. Batterson is clinically doing well. CBC and CMP are normal. Breast exam is benign. No clinical concern for recurrence.  -continue surveillance, she prefers to continue f/u with [name] annually.  -lab and f/u 07/2020  2. Hypothyroidism, depression, obesity -she has lost 45 lbs intentionally through weight management program, she feels great   3. Genetic Testing was normal and did not reveal a mutation in these genes.  4. Health maintenance  -DEXA in 02/2019 was normal, repeat 02/2021 -She is due for repeat colonoscopy by Dr. Hilarie Fredrickson, she will call his office to schedule   PLAN: -Labs reviewed -Continue surveillance -Reviewed health maintenance  -Lab and f/u in 1 year   No problem-specific Assessment & Plan notes found for this encounter.   No orders of the defined types were placed in this encounter.  All questions were answered. The patient knows to call the clinic with any problems, questions or concerns. No barriers to learning was detected.     Vanessa Feeling, NP 07/31/19

## 2019-07-31 ENCOUNTER — Inpatient Hospital Stay: Payer: Medicare Other | Attending: Nurse Practitioner

## 2019-07-31 ENCOUNTER — Other Ambulatory Visit: Payer: Self-pay

## 2019-07-31 ENCOUNTER — Encounter: Payer: Self-pay | Admitting: Nurse Practitioner

## 2019-07-31 ENCOUNTER — Inpatient Hospital Stay (HOSPITAL_BASED_OUTPATIENT_CLINIC_OR_DEPARTMENT_OTHER): Payer: Medicare Other | Admitting: Nurse Practitioner

## 2019-07-31 VITALS — BP 143/74 | HR 79 | Temp 97.8°F | Resp 18 | Ht 62.0 in | Wt 202.8 lb

## 2019-07-31 DIAGNOSIS — C50412 Malignant neoplasm of upper-outer quadrant of left female breast: Secondary | ICD-10-CM | POA: Diagnosis not present

## 2019-07-31 DIAGNOSIS — G4733 Obstructive sleep apnea (adult) (pediatric): Secondary | ICD-10-CM | POA: Insufficient documentation

## 2019-07-31 DIAGNOSIS — F329 Major depressive disorder, single episode, unspecified: Secondary | ICD-10-CM | POA: Insufficient documentation

## 2019-07-31 DIAGNOSIS — F419 Anxiety disorder, unspecified: Secondary | ICD-10-CM | POA: Diagnosis not present

## 2019-07-31 DIAGNOSIS — Z9221 Personal history of antineoplastic chemotherapy: Secondary | ICD-10-CM | POA: Insufficient documentation

## 2019-07-31 DIAGNOSIS — Z7982 Long term (current) use of aspirin: Secondary | ICD-10-CM | POA: Diagnosis not present

## 2019-07-31 DIAGNOSIS — E039 Hypothyroidism, unspecified: Secondary | ICD-10-CM | POA: Insufficient documentation

## 2019-07-31 DIAGNOSIS — Z9012 Acquired absence of left breast and nipple: Secondary | ICD-10-CM | POA: Diagnosis not present

## 2019-07-31 DIAGNOSIS — Z79899 Other long term (current) drug therapy: Secondary | ICD-10-CM | POA: Insufficient documentation

## 2019-07-31 DIAGNOSIS — Z791 Long term (current) use of non-steroidal anti-inflammatories (NSAID): Secondary | ICD-10-CM | POA: Insufficient documentation

## 2019-07-31 DIAGNOSIS — Z170 Estrogen receptor positive status [ER+]: Secondary | ICD-10-CM | POA: Insufficient documentation

## 2019-07-31 DIAGNOSIS — E669 Obesity, unspecified: Secondary | ICD-10-CM | POA: Diagnosis not present

## 2019-07-31 LAB — CBC WITH DIFFERENTIAL/PLATELET
Abs Immature Granulocytes: 0.01 10*3/uL (ref 0.00–0.07)
Basophils Absolute: 0 10*3/uL (ref 0.0–0.1)
Basophils Relative: 0 %
Eosinophils Absolute: 0.2 10*3/uL (ref 0.0–0.5)
Eosinophils Relative: 3 %
HCT: 38.3 % (ref 36.0–46.0)
Hemoglobin: 12 g/dL (ref 12.0–15.0)
Immature Granulocytes: 0 %
Lymphocytes Relative: 28 %
Lymphs Abs: 1.6 10*3/uL (ref 0.7–4.0)
MCH: 28.4 pg (ref 26.0–34.0)
MCHC: 31.3 g/dL (ref 30.0–36.0)
MCV: 90.8 fL (ref 80.0–100.0)
Monocytes Absolute: 0.6 10*3/uL (ref 0.1–1.0)
Monocytes Relative: 11 %
Neutro Abs: 3.2 10*3/uL (ref 1.7–7.7)
Neutrophils Relative %: 58 %
Platelets: 167 10*3/uL (ref 150–400)
RBC: 4.22 MIL/uL (ref 3.87–5.11)
RDW: 14.3 % (ref 11.5–15.5)
WBC: 5.5 10*3/uL (ref 4.0–10.5)
nRBC: 0 % (ref 0.0–0.2)

## 2019-07-31 LAB — COMPREHENSIVE METABOLIC PANEL
ALT: 22 U/L (ref 0–44)
AST: 18 U/L (ref 15–41)
Albumin: 4 g/dL (ref 3.5–5.0)
Alkaline Phosphatase: 69 U/L (ref 38–126)
Anion gap: 8 (ref 5–15)
BUN: 23 mg/dL (ref 8–23)
CO2: 26 mmol/L (ref 22–32)
Calcium: 9.2 mg/dL (ref 8.9–10.3)
Chloride: 107 mmol/L (ref 98–111)
Creatinine, Ser: 0.75 mg/dL (ref 0.44–1.00)
GFR calc Af Amer: >60 mL/min (ref >60)
GFR calc non Af Amer: >60 mL/min (ref >60)
Glucose, Bld: 88 mg/dL (ref 70–99)
Potassium: 4.6 mmol/L (ref 3.5–5.1)
Sodium: 141 mmol/L (ref 135–145)
Total Bilirubin: 0.4 mg/dL (ref 0.3–1.2)
Total Protein: 6.9 g/dL (ref 6.5–8.1)

## 2019-08-01 ENCOUNTER — Telehealth: Payer: Self-pay | Admitting: Nurse Practitioner

## 2019-08-01 ENCOUNTER — Other Ambulatory Visit: Payer: Self-pay | Admitting: Family Medicine

## 2019-08-01 NOTE — Telephone Encounter (Signed)
Scheduled appt per 2/25 los.  Sent a message to HIM pool to get a calendar mailed out

## 2019-08-05 ENCOUNTER — Encounter (INDEPENDENT_AMBULATORY_CARE_PROVIDER_SITE_OTHER): Payer: Self-pay | Admitting: Bariatrics

## 2019-08-05 ENCOUNTER — Telehealth (INDEPENDENT_AMBULATORY_CARE_PROVIDER_SITE_OTHER): Payer: Medicare Other | Admitting: Bariatrics

## 2019-08-05 ENCOUNTER — Other Ambulatory Visit: Payer: Self-pay

## 2019-08-05 DIAGNOSIS — E038 Other specified hypothyroidism: Secondary | ICD-10-CM

## 2019-08-05 DIAGNOSIS — E559 Vitamin D deficiency, unspecified: Secondary | ICD-10-CM | POA: Diagnosis not present

## 2019-08-05 DIAGNOSIS — Z6837 Body mass index (BMI) 37.0-37.9, adult: Secondary | ICD-10-CM | POA: Diagnosis not present

## 2019-08-05 NOTE — Progress Notes (Signed)
TeleHealth Visit:  Due to the COVID-19 pandemic, this visit was completed with telemedicine (audio/video) technology to reduce patient and provider exposure as well as to preserve personal protective equipment.   Vanessa Christensen has verbally consented to this TeleHealth visit. The patient is located at home, the provider is located at the [facility] office. The participants in this visit include the listed provider and patient. The visit was conducted today via Skype.  Chief Complaint: OBESITY Vanessa Christensen is here to discuss her progress with her obesity treatment plan along with follow-up of her obesity related diagnoses. Vanessa Christensen is on the Category 3 Plan and states she is following her eating plan approximately 75% of the time. Vanessa Christensen states she is doing cardio/strengthening 45-60 minutes 4-5 times per week.  Today's visit was #: 23 Starting weight: 241 lbs Starting date: 08/13/2018  Interim History: Vanessa Christensen states that her weight is up 1-2 lbs (weight 199).  Subjective:   Vitamin D deficiency. Vanessa Christensen is taking Vitamin D. Last Vitamin D 42.4 on 11/04/2018.  Other specified hypothyroidism. Vanessa Christensen is taking Synthroid. She reports good energy.  Lab Results  Component Value Date   TSH 1.09 06/04/2019   Assessment/Plan:   Vitamin D deficiency. Low Vitamin D level contributes to fatigue and are associated with obesity, breast, and colon cancer. She agrees to continue to take OTC Vitamin D and will follow-up for routine testing of Vitamin D, at least 2-3 times per year to avoid over-replacement.  Other specified hypothyroidism. Patient with long-standing hypothyroidism, on levothyroxine therapy. She appears euthyroid. Orders and follow up as documented in patient record. Vanessa Christensen will continue medications as directed.  Counseling . Good thyroid control is important for overall health. Supratherapeutic thyroid levels are dangerous and will not improve weight loss results. . The  correct way to take levothyroxine is fasting, with water, separated by at least 30 minutes from breakfast, and separated by more than 4 hours from calcium, iron, multivitamins, acid reflux medications (PPIs).   Class 2 severe obesity with serious comorbidity and body mass index (BMI) of 37.0 to 37.9 in adult, unspecified obesity type ([place]).  Vanessa Christensen is currently in the action stage of change. As such, her goal is to continue with weight loss efforts. She has agreed to the Category 3 Plan.   She will work on meal planning, intentional eating, and increasing her water intake.  Exercise goals: Vanessa Christensen will continue cardio/strengthening 45-60 minutes 4-5 times per week.  Behavioral modification strategies: increasing lean protein intake, decreasing simple carbohydrates, increasing vegetables, increasing water intake, keeping healthy foods in the home, better snacking choices and planning for success.  Vanessa Christensen has agreed to follow-up with our clinic in 2 weeks. She was informed of the importance of frequent follow-up visits to maximize her success with intensive lifestyle modifications for her multiple health conditions.  Objective:   VITALS: Per patient if applicable, see vitals. GENERAL: Alert and in no acute distress. CARDIOPULMONARY: No increased WOB. Speaking in clear sentences.  PSYCH: Pleasant and cooperative. Speech normal rate and rhythm. Affect is appropriate. Insight and judgement are appropriate. Attention is focused, linear, and appropriate.  NEURO: Oriented as arrived to appointment on time with no prompting.   Lab Results  Component Value Date   CREATININE 0.75 07/31/2019   BUN 23 07/31/2019   NA 141 07/31/2019   K 4.6 07/31/2019   CL 107 07/31/2019   CO2 26 07/31/2019   Lab Results  Component Value Date   ALT 22 07/31/2019  AST 18 07/31/2019   ALKPHOS 69 07/31/2019   BILITOT 0.4 07/31/2019   Lab Results  Component Value Date   HGBA1C 5.4 11/04/2018   HGBA1C 5.6  08/13/2018   Lab Results  Component Value Date   INSULIN 10.9 11/04/2018   INSULIN 13.2 08/13/2018   Lab Results  Component Value Date   TSH 1.09 06/04/2019   Lab Results  Component Value Date   CHOL 170 05/08/2018   HDL 43.40 05/08/2018   LDLCALC 103 (H) 05/08/2018   TRIG 114.0 05/08/2018   CHOLHDL 4 05/08/2018   Lab Results  Component Value Date   WBC 5.5 07/31/2019   HGB 12.0 07/31/2019   HCT 38.3 07/31/2019   MCV 90.8 07/31/2019   PLT 167 07/31/2019   No results found for: IRON, TIBC, FERRITIN  Attestation Statements:   Reviewed by clinician on day of visit: allergies, medications, problem list, medical history, surgical history, family history, social history, and previous encounter notes.  Time spent on visit including pre-visit chart review and post-visit charting and care was 20 minutes.   [name], am acting as Location manager for [employer], DO   I have reviewed the above documentation for accuracy and completeness, and I agree with the above. Jearld Lesch, DO

## 2019-08-11 ENCOUNTER — Ambulatory Visit: Payer: Medicare Other | Attending: Internal Medicine

## 2019-08-11 DIAGNOSIS — Z23 Encounter for immunization: Secondary | ICD-10-CM

## 2019-08-11 NOTE — Progress Notes (Signed)
   Covid-19 Vaccination Clinic  Name:  Vanessa Christensen    MRN: [id number] DOB: 1951/02/06  08/11/2019  Vanessa Christensen was observed post Covid-19 immunization for 15 minutes without incident. She was provided with Vaccine Information Sheet and instruction to access the V-Safe system.   Vanessa Christensen was instructed to call 911 with any severe reactions post vaccine: Marland Kitchen Difficulty breathing  . Swelling of face and throat  . A fast heartbeat  . A bad rash all over body  . Dizziness and weakness   Immunizations Administered    Name Date Dose VIS Date Route   Pfizer COVID-19 Vaccine 08/11/2019 11:55 AM 0.3 mL 05/16/2019 Intramuscular   Manufacturer: [place]   Lot: [id number]   [place]: [id number]

## 2019-08-20 ENCOUNTER — Encounter: Payer: Self-pay | Admitting: Internal Medicine

## 2019-08-26 ENCOUNTER — Encounter (INDEPENDENT_AMBULATORY_CARE_PROVIDER_SITE_OTHER): Payer: Self-pay | Admitting: Bariatrics

## 2019-08-26 ENCOUNTER — Ambulatory Visit (INDEPENDENT_AMBULATORY_CARE_PROVIDER_SITE_OTHER): Payer: Medicare Other | Admitting: Bariatrics

## 2019-08-26 ENCOUNTER — Other Ambulatory Visit: Payer: Self-pay

## 2019-08-26 VITALS — BP 144/67 | HR 78 | Temp 98.1°F | Ht 62.0 in | Wt 199.0 lb

## 2019-08-26 DIAGNOSIS — E038 Other specified hypothyroidism: Secondary | ICD-10-CM | POA: Diagnosis not present

## 2019-08-26 DIAGNOSIS — E8881 Metabolic syndrome: Secondary | ICD-10-CM

## 2019-08-26 DIAGNOSIS — Z6836 Body mass index (BMI) 36.0-36.9, adult: Secondary | ICD-10-CM

## 2019-08-26 NOTE — Progress Notes (Signed)
Chief Complaint:   OBESITY Vanessa Christensen is here to discuss her progress with her obesity treatment plan along with follow-up of her obesity related diagnoses. Vanessa Christensen is on the Category 3 Plan and states she is following her eating plan approximately 50% of the time. Vanessa Christensen states she is doing cardio/strengthening 45-60 minutes 4-5 times per week.  Today's visit was #: 24 Starting weight: 241 lbs Starting date: 08/13/2018 Today's weight: 199 lbs Today's date: 08/26/2019 Total lbs lost to date: 42 Total lbs lost since last in-office visit: 42  Interim History: Vanessa Christensen is down 42 lbs from her last visit on 08/13/2018, approximately 1 year ago. She has been doing a lot of walking.  Subjective:   Insulin resistance. Vanessa Christensen has a diagnosis of insulin resistance based on her elevated fasting insulin level >5. She continues to work on diet and exercise to decrease her risk of diabetes. Vanessa Christensen has been cutting carbohydrates and increasing exercise.  Lab Results  Component Value Date   INSULIN 10.9 11/04/2018   INSULIN 13.2 08/13/2018   Lab Results  Component Value Date   HGBA1C 5.4 11/04/2018   Other specified hypothyroidism. Vanessa Christensen is taking Synthroid.   Lab Results  Component Value Date   TSH 1.09 06/04/2019   Assessment/Plan:   Insulin resistance. Vanessa Christensen will continue to work on weight loss, continue exercise, and decreasing simple carbohydrates to help decrease the risk of diabetes. Vanessa Christensen agreed to follow-up with [name] as directed to closely monitor her progress. She will have labs drawn in the near future.  Other specified hypothyroidism. Patient with long-standing hypothyroidism, on levothyroxine therapy. She appears euthyroid. Orders and follow up as documented in patient record. Vanessa Christensen will continue synthroid as directed.  Counseling . Good thyroid control is important for overall health. Supratherapeutic thyroid levels are dangerous and will not improve weight loss  results. . The correct way to take levothyroxine is fasting, with water, separated by at least 30 minutes from breakfast, and separated by more than 4 hours from calcium, iron, multivitamins, acid reflux medications (PPIs).   Class 2 severe obesity with serious comorbidity and body mass index (BMI) of 36.0 to 36.9 in adult, unspecified obesity type ([place]).  Vanessa Christensen is currently in the action stage of change. As such, her goal is to continue with weight loss efforts. She has agreed to the Category 3 Plan.   She will work on meal planning and mindful eating.  Exercise goals: Vanessa Christensen will continue to exercise and will increase over time.  Behavioral modification strategies: increasing lean protein intake, decreasing simple carbohydrates, increasing vegetables, increasing water intake, decreasing liquid calories, decreasing eating out, no skipping meals, meal planning and cooking strategies, keeping healthy foods in the home and planning for success.  Vanessa Christensen has agreed to follow-up with our clinic in 2 weeks. She was informed of the importance of frequent follow-up visits to maximize her success with intensive lifestyle modifications for her multiple health conditions.   Objective:   Blood pressure (!) 144/67, pulse 78, temperature 98.1 F (36.7 C), height 5\' 2"  (1.575 m), weight 199 lb (90.3 kg), SpO2 97 %. Body mass index is 36.4 kg/m.  General: Cooperative, alert, well developed, in no acute distress. HEENT: Conjunctivae and lids unremarkable. Cardiovascular: Regular rhythm.  Lungs: Normal work of breathing. Neurologic: No focal deficits.   Lab Results  Component Value Date   CREATININE 0.75 07/31/2019   BUN 23 07/31/2019   NA 141 07/31/2019   K 4.6 07/31/2019   CL 107  07/31/2019   CO2 26 07/31/2019   Lab Results  Component Value Date   ALT 22 07/31/2019   AST 18 07/31/2019   ALKPHOS 69 07/31/2019   BILITOT 0.4 07/31/2019   Lab Results  Component Value Date   HGBA1C 5.4  11/04/2018   HGBA1C 5.6 08/13/2018   Lab Results  Component Value Date   INSULIN 10.9 11/04/2018   INSULIN 13.2 08/13/2018   Lab Results  Component Value Date   TSH 1.09 06/04/2019   Lab Results  Component Value Date   CHOL 170 05/08/2018   HDL 43.40 05/08/2018   LDLCALC 103 (H) 05/08/2018   TRIG 114.0 05/08/2018   CHOLHDL 4 05/08/2018   Lab Results  Component Value Date   WBC 5.5 07/31/2019   HGB 12.0 07/31/2019   HCT 38.3 07/31/2019   MCV 90.8 07/31/2019   PLT 167 07/31/2019   No results found for: IRON, TIBC, FERRITIN  Obesity Behavioral Intervention Documentation for Insurance:   Approximately 15 minutes were spent on the discussion below.  ASK: We discussed the diagnosis of obesity with Vanessa Christensen today and Vanessa Christensen agreed to give [name] permission to discuss obesity behavioral modification therapy today.  ASSESS: Vanessa Christensen has the diagnosis of obesity and her BMI today is 36.5. Vanessa Christensen is in the action stage of change.   ADVISE: Vanessa Christensen was educated on the multiple health risks of obesity as well as the benefit of weight loss to improve her health. She was advised of the need for long term treatment and the importance of lifestyle modifications to improve her current health and to decrease her risk of future health problems.  AGREE: Multiple dietary modification options and treatment options were discussed and Vanessa Christensen agreed to follow the recommendations documented in the above note.  ARRANGE: Vanessa Christensen was educated on the importance of frequent visits to treat obesity as outlined per CMS and USPSTF guidelines and agreed to schedule her next follow up appointment today.  Attestation Statements:   Reviewed by clinician on day of visit: allergies, medications, problem list, medical history, surgical history, family history, social history, and previous encounter notes.  Vanessa Christensen Dk, am acting as Location manager for [employer], DO   I have reviewed the above documentation  for accuracy and completeness, and I agree with the above. Jearld Lesch, DO

## 2019-08-27 ENCOUNTER — Encounter (INDEPENDENT_AMBULATORY_CARE_PROVIDER_SITE_OTHER): Payer: Self-pay | Admitting: Bariatrics

## 2019-09-10 ENCOUNTER — Telehealth: Payer: Self-pay | Admitting: Family Medicine

## 2019-09-10 NOTE — Progress Notes (Signed)
  Chronic Care Management   Note  09/10/2019 Name: Vanessa Christensen MRN: [id number] DOB: 1950-11-08  Vanessa Christensen is a 69 y.o. year old female who is a primary care patient of [name], Aundra Millet, MD. I reached out to [name] by phone today in response to a referral sent by Ms. Curly [place] PCP, [name], MD.   Vanessa Christensen was given information about Chronic Care Management services today including:  1. CCM service includes personalized support from designated clinical staff supervised by her physician, including individualized plan of care and coordination with other care providers 2. 24/7 contact phone numbers for assistance for urgent and routine care needs. 3. Service will only be billed when office clinical staff spend 20 minutes or more in a month to coordinate care. 4. Only one practitioner may furnish and bill the service in a calendar month. 5. The patient may stop CCM services at any time (effective at the end of the month) by phone call to the office staff.   Patient agreed to services and verbal consent obtained.   Follow up plan:   Earney [place] Upstream Scheduler

## 2019-09-11 ENCOUNTER — Other Ambulatory Visit: Payer: Self-pay

## 2019-09-11 ENCOUNTER — Ambulatory Visit (AMBULATORY_SURGERY_CENTER): Payer: Self-pay | Admitting: *Deleted

## 2019-09-11 VITALS — Temp 96.0°F | Ht 62.0 in | Wt 201.6 lb

## 2019-09-11 DIAGNOSIS — Z86010 Personal history of colonic polyps: Secondary | ICD-10-CM

## 2019-09-11 MED ORDER — SUTAB 1479-225-188 MG PO TABS: 1.0000 | ORAL_TABLET | Freq: Once | ORAL | 0 refills | Status: AC

## 2019-09-11 NOTE — Progress Notes (Signed)
Pt completed covid vaccine 08-11-19  Pt is aware that care partner will wait in the car during procedure; if they feel like they will be too hot or cold to wait in the car; they may wait in the 4 th floor lobby. Patient is aware to bring only one care partner. We want them to wear a mask (we do not have any that we can provide them), practice social distancing, and we will check their temperatures when they get here.  I did remind the patient that their care partner needs to stay in the parking lot the entire time and have a cell phone available, we will call them when the pt is ready for discharge. Patient will wear mask into building.   No egg or soy allergy  No home oxygen use   No medications for weight loss taken   No trouble with anesthesia, difficulty with intubation or hx/fam hx of malignant hyperthermia per pt  Sutab code put into RX and paper copy given to pt to show pharmacy

## 2019-09-15 ENCOUNTER — Ambulatory Visit (INDEPENDENT_AMBULATORY_CARE_PROVIDER_SITE_OTHER): Payer: Medicare Other | Admitting: Bariatrics

## 2019-09-16 ENCOUNTER — Other Ambulatory Visit: Payer: Self-pay

## 2019-09-16 ENCOUNTER — Encounter (INDEPENDENT_AMBULATORY_CARE_PROVIDER_SITE_OTHER): Payer: Self-pay | Admitting: Physician Assistant

## 2019-09-16 ENCOUNTER — Ambulatory Visit (INDEPENDENT_AMBULATORY_CARE_PROVIDER_SITE_OTHER): Payer: Medicare Other | Admitting: Physician Assistant

## 2019-09-16 VITALS — BP 132/67 | HR 88 | Temp 98.4°F | Ht 62.0 in | Wt 198.0 lb

## 2019-09-16 DIAGNOSIS — E559 Vitamin D deficiency, unspecified: Secondary | ICD-10-CM

## 2019-09-16 DIAGNOSIS — Z6836 Body mass index (BMI) 36.0-36.9, adult: Secondary | ICD-10-CM | POA: Diagnosis not present

## 2019-09-16 DIAGNOSIS — E8881 Metabolic syndrome: Secondary | ICD-10-CM

## 2019-09-16 NOTE — Progress Notes (Signed)
Chief Complaint:   OBESITY Vanessa Christensen is here to discuss her progress with her obesity treatment plan along with follow-up of her obesity related diagnoses. Vanessa Christensen is on the Category 3 Plan and states she is following her eating plan approximately 50% of the time. Vanessa Christensen states she is doing cardio and strength training for 60 minutes 5 times per week.  Today's visit was #: 25 Starting weight: 241 lbs Starting date: 08/13/2018 Today's weight: 198 lbs Today's date: 09/16/2019 Total lbs lost to date: 43 lbs Total lbs lost since last in-office visit: 1 lb  Interim History: Vanessa Christensen reports that she is only eating 2 eggs with vegetables for breakfast.  This morning she ate 3 homemade muffins with coffee.  She is not getting enough protein for breakfast and sometimes not getting enough for dinner.  Subjective:   1. Vitamin D deficiency Vanessa Christensen's Vitamin D level was 42.4 on 11/04/2018. She is currently taking OTC vitamin D 1000 each day. She denies nausea, vomiting or muscle weakness.  Last level not at goal.  2. Insulin resistance Vanessa Christensen has a diagnosis of insulin resistance based on her elevated fasting insulin level >5. She continues to work on diet and exercise to decrease her risk of diabetes.  No polyphagia.  No medications.    Lab Results  Component Value Date   INSULIN 10.9 11/04/2018   INSULIN 13.2 08/13/2018   Lab Results  Component Value Date   HGBA1C 5.4 11/04/2018   Assessment/Plan:   1. Vitamin D deficiency Low Vitamin D level contributes to fatigue and are associated with obesity, breast, and colon cancer. She agrees to continue to take prescription Vitamin D @50 ,000 IU every week and will follow-up for routine testing of Vitamin D, at least 2-3 times per year to avoid over-replacement.  2. Insulin resistance Vanessa Christensen will continue to work on weight loss, exercise, and decreasing simple carbohydrates to help decrease the risk of diabetes. Vanessa Christensen agreed to follow-up with [name] as  directed to closely monitor her progress.  3. Class 2 severe obesity with serious comorbidity and body mass index (BMI) of 36.0 to 36.9 in adult, unspecified obesity type Vanessa Christensen) Vanessa Christensen is currently in the action stage of change. As such, her goal is to continue with weight loss efforts. She has agreed to the Category 3 Plan.   Exercise goals: As is.  Behavioral modification strategies: increasing lean protein intake and meal planning and cooking strategies.  Vanessa Christensen has agreed to follow-up with our clinic in 2 weeks. She was informed of the importance of frequent follow-up visits to maximize her success with intensive lifestyle modifications for her multiple health conditions.   Objective:   Blood pressure 132/67, pulse 88, temperature 98.4 F (36.9 C), temperature source Oral, height 5\' 2"  (1.575 m), weight 198 lb (89.8 kg), SpO2 97 %. Body mass index is 36.21 kg/m.  General: Cooperative, alert, well developed, in no acute distress. HEENT: Conjunctivae and lids unremarkable. Cardiovascular: Regular rhythm.  Lungs: Normal work of breathing. Neurologic: No focal deficits.   Lab Results  Component Value Date   CREATININE 0.75 07/31/2019   BUN 23 07/31/2019   NA 141 07/31/2019   K 4.6 07/31/2019   CL 107 07/31/2019   CO2 26 07/31/2019   Lab Results  Component Value Date   ALT 22 07/31/2019   AST 18 07/31/2019   ALKPHOS 69 07/31/2019   BILITOT 0.4 07/31/2019   Lab Results  Component Value Date   HGBA1C 5.4 11/04/2018   HGBA1C  5.6 08/13/2018   Lab Results  Component Value Date   INSULIN 10.9 11/04/2018   INSULIN 13.2 08/13/2018   Lab Results  Component Value Date   TSH 1.09 06/04/2019   Lab Results  Component Value Date   CHOL 170 05/08/2018   HDL 43.40 05/08/2018   LDLCALC 103 (H) 05/08/2018   TRIG 114.0 05/08/2018   CHOLHDL 4 05/08/2018   Lab Results  Component Value Date   WBC 5.5 07/31/2019   HGB 12.0 07/31/2019   HCT 38.3 07/31/2019   MCV 90.8  07/31/2019   PLT 167 07/31/2019   Obesity Behavioral Intervention Documentation for Insurance:   Approximately 15 minutes were spent on the discussion below.  ASK: We discussed the diagnosis of obesity with Vanessa Christensen today and Vanessa Christensen agreed to give [name] permission to discuss obesity behavioral modification therapy today.  ASSESS: Vanessa Christensen has the diagnosis of obesity and her BMI today is 36.2. Vanessa Christensen is in the action stage of change.   ADVISE: Vanessa Christensen was educated on the multiple health risks of obesity as well as the benefit of weight loss to improve her health. She was advised of the need for long term treatment and the importance of lifestyle modifications to improve her current health and to decrease her risk of future health problems.  AGREE: Multiple dietary modification options and treatment options were discussed and Vanessa Christensen agreed to follow the recommendations documented in the above note.  ARRANGE: Vanessa Christensen was educated on the importance of frequent visits to treat obesity as outlined per CMS and USPSTF guidelines and agreed to schedule her next follow up appointment today.  Attestation Statements:   Reviewed by clinician on day of visit: allergies, medications, problem list, medical history, surgical history, family history, social history, and previous encounter notes.  I, Water quality scientist, CMA, am acting as Location manager for [employer], PA-C.  I have reviewed the above documentation for accuracy and completeness, and I agree with the above. Vanessa Potash, PA-C

## 2019-09-19 ENCOUNTER — Other Ambulatory Visit: Payer: Self-pay | Admitting: General Practice

## 2019-09-19 DIAGNOSIS — E038 Other specified hypothyroidism: Secondary | ICD-10-CM

## 2019-09-19 DIAGNOSIS — Z6836 Body mass index (BMI) 36.0-36.9, adult: Secondary | ICD-10-CM

## 2019-09-23 ENCOUNTER — Encounter: Payer: Self-pay | Admitting: Internal Medicine

## 2019-09-23 ENCOUNTER — Other Ambulatory Visit: Payer: Self-pay

## 2019-09-23 ENCOUNTER — Ambulatory Visit (AMBULATORY_SURGERY_CENTER): Payer: Medicare Other | Admitting: Internal Medicine

## 2019-09-23 VITALS — BP 121/58 | HR 66 | Temp 97.1°F | Resp 12 | Ht 62.0 in | Wt 201.6 lb

## 2019-09-23 DIAGNOSIS — D124 Benign neoplasm of descending colon: Secondary | ICD-10-CM | POA: Diagnosis not present

## 2019-09-23 DIAGNOSIS — Z86010 Personal history of colonic polyps: Secondary | ICD-10-CM | POA: Diagnosis not present

## 2019-09-23 DIAGNOSIS — D122 Benign neoplasm of ascending colon: Secondary | ICD-10-CM | POA: Diagnosis not present

## 2019-09-23 DIAGNOSIS — G4733 Obstructive sleep apnea (adult) (pediatric): Secondary | ICD-10-CM | POA: Diagnosis not present

## 2019-09-23 MED ORDER — SODIUM CHLORIDE 0.9 % IV SOLN: 500.0000 mL | Freq: Once | INTRAVENOUS | Status: DC

## 2019-09-23 NOTE — Patient Instructions (Signed)
Discharge instructions given. Handouts on polyps,diverticulosis and hemorrhoids. Resume previous medications. YOU HAD AN ENDOSCOPIC PROCEDURE TODAY AT THE [facility]:   Refer to the procedure report that was given to you for any specific questions about what was found during the examination.  If the procedure report does not answer your questions, please call your gastroenterologist to clarify.  If you requested that your care partner not be given the details of your procedure findings, then the procedure report has been included in a sealed envelope for you to review at your convenience later.  YOU SHOULD EXPECT: Some feelings of bloating in the abdomen. Passage of more gas than usual.  Walking can help get rid of the air that was put into your GI tract during the procedure and reduce the bloating. If you had a lower endoscopy (such as a colonoscopy or flexible sigmoidoscopy) you may notice spotting of blood in your stool or on the toilet paper. If you underwent a bowel prep for your procedure, you may not have a normal bowel movement for a few days.  Please Note:  You might notice some irritation and congestion in your nose or some drainage.  This is from the oxygen used during your procedure.  There is no need for concern and it should clear up in a day or so.  SYMPTOMS TO REPORT IMMEDIATELY:   Following lower endoscopy (colonoscopy or flexible sigmoidoscopy):  Excessive amounts of blood in the stool  Significant tenderness or worsening of abdominal pains  Swelling of the abdomen that is new, acute  Fever of 100F or higher   For urgent or emergent issues, a gastroenterologist can be reached at any hour by calling [phone]. Do not use MyChart messaging for urgent concerns.    DIET:  We do recommend a small meal at first, but then you may proceed to your regular diet.  Drink plenty of fluids but you should avoid alcoholic beverages for 24 hours.  ACTIVITY:  You should  plan to take it easy for the rest of today and you should NOT DRIVE or use heavy machinery until tomorrow (because of the sedation medicines used during the test).    FOLLOW UP: Our staff will call the number listed on your records 48-72 hours following your procedure to check on you and address any questions or concerns that you may have regarding the information given to you following your procedure. If we do not reach you, we will leave a message.  We will attempt to reach you two times.  During this call, we will ask if you have developed any symptoms of COVID 19. If you develop any symptoms (ie: fever, flu-like symptoms, shortness of breath, cough etc.) before then, please call [phone].  If you test positive for Covid 19 in the 2 weeks post procedure, please call and report this information to us.    If any biopsies were taken you will be contacted by phone or by letter within the next 1-3 weeks.  Please call us at [phone] if you have not heard about the biopsies in 3 weeks.    SIGNATURES/CONFIDENTIALITY: You and/or your care partner have signed paperwork which will be entered into your electronic medical record.  These signatures attest to the fact that that the information above on your After Visit Summary has been reviewed and is understood.  Full responsibility of the confidentiality of this discharge information lies with you and/or your care-partner. 

## 2019-09-23 NOTE — Progress Notes (Signed)
Called to room to assist during endoscopic procedure.  Patient ID and intended procedure confirmed with present staff. Received instructions for my participation in the procedure from the performing physician.  

## 2019-09-23 NOTE — Progress Notes (Signed)
Temp JB VS DT  Pt's states no medical or surgical changes since previsit or office visit. 

## 2019-09-23 NOTE — Progress Notes (Signed)
Report given to PACU, vss 

## 2019-09-23 NOTE — Op Note (Signed)
[place] Patient Name: Vanessa Christensen Procedure Date: 09/23/2019 11:07 AM MRN: [id number] Endoscopist: Jerene Bears , MD Age: 69 Referring MD:  Date of Birth: [birth year] Gender: Female Account #: [id number] Procedure:                Colonoscopy Indications:              High risk colon cancer surveillance: Personal                            history of multiple (3 or more) adenomas, Last                            colonoscopy: February 2018 Medicines:                Monitored Anesthesia Care Procedure:                Pre-Anesthesia Assessment:                           - Prior to the procedure, a History and Physical                            was performed, and patient medications and                            allergies were reviewed. The patient's tolerance of                            previous anesthesia was also reviewed. The risks                            and benefits of the procedure and the sedation                            options and risks were discussed with the patient.                            All questions were answered, and informed consent                            was obtained. Prior Anticoagulants: The patient has                            taken no previous anticoagulant or antiplatelet                            agents. ASA Grade Assessment: II - A patient with                            mild systemic disease. After reviewing the risks                            and benefits, the patient was deemed in  satisfactory condition to undergo the procedure.                           After obtaining informed consent, the colonoscope                            was passed under direct vision. Throughout the                            procedure, the patient's blood pressure, pulse, and                            oxygen saturations were monitored continuously. The                            Colonoscope was introduced through the  anus and                            advanced to the cecum, identified by appendiceal                            orifice and ileocecal valve. The colonoscopy was                            performed without difficulty. The patient tolerated                            the procedure well. The quality of the bowel                            preparation was good. Scope In: 11:14:23 AM Scope Out: 11:31:58 AM Scope Withdrawal Time: 0 hours 13 minutes 57 seconds  Total Procedure Duration: 0 hours 17 minutes 35 seconds  Findings:                 The digital rectal exam was normal.                           Three sessile polyps were found in the ascending                            colon. The polyps were 4 to 5 mm in size. These                            polyps were removed with a cold snare. Resection                            and retrieval were complete.                           A 5 mm polyp was found in the descending colon. The                            polyp was sessile. The polyp was removed  with a                            cold snare. Resection and retrieval were complete.                           Multiple small-mouthed diverticula were found in                            the sigmoid colon, hepatic flexure and ascending                            colon.                           Internal hemorrhoids were found during                            retroflexion. The hemorrhoids were small. Complications:            No immediate complications. Estimated Blood Loss:     Estimated blood loss was minimal. Impression:               - Three 4 to 5 mm polyps in the ascending colon,                            removed with a cold snare. Resected and retrieved.                           - One 5 mm polyp in the descending colon, removed                            with a cold snare. Resected and retrieved.                           - Diverticulosis in the sigmoid colon, at the                             hepatic flexure and in the ascending colon.                           - Internal hemorrhoids. Recommendation:           - Patient has a contact number available for                            emergencies. The signs and symptoms of potential                            delayed complications were discussed with the                            patient. Return to normal activities tomorrow.                            Written discharge instructions were provided  to the                            patient.                           - Resume previous diet.                           - Continue present medications.                           - Await pathology results.                           - Repeat colonoscopy is recommended for                            surveillance. The colonoscopy date will be                            determined after pathology results from today's                            exam become available for review. Jerene Bears, MD 09/23/2019 11:34:20 AM This report has been signed electronically.

## 2019-09-25 ENCOUNTER — Telehealth: Payer: Self-pay | Admitting: *Deleted

## 2019-09-25 NOTE — Telephone Encounter (Signed)
  Follow up Call-  Call back number 09/23/2019  Post procedure Call Back phone  # [phone]  Permission to leave phone message Yes  Some recent data might be hidden     Patient questions:  Do you have a fever, pain , or abdominal swelling? No. Pain Score  0 *  Have you tolerated food without any problems? Yes.    Have you been able to return to your normal activities? Yes.    Do you have any questions about your discharge instructions: Diet   No. Medications  No. Follow up visit  No.  Do you have questions or concerns about your Care? No.  Actions: * If pain score is 4 or above: No action needed, pain <4.  1. Have you developed a fever since your procedure? no  2.   Have you had an respiratory symptoms (SOB or cough) since your procedure? no  3.   Have you tested positive for COVID 19 since your procedure no  4.   Have you had any family members/close contacts diagnosed with the COVID 19 since your procedure?  no   If yes to any of these questions please route to Joylene John, RN and Erenest Rasher, RN

## 2019-09-26 ENCOUNTER — Encounter: Payer: Self-pay | Admitting: Internal Medicine

## 2019-09-26 ENCOUNTER — Ambulatory Visit: Payer: Medicare Other

## 2019-09-26 ENCOUNTER — Other Ambulatory Visit: Payer: Self-pay

## 2019-09-26 DIAGNOSIS — N3281 Overactive bladder: Secondary | ICD-10-CM

## 2019-09-26 DIAGNOSIS — F419 Anxiety disorder, unspecified: Secondary | ICD-10-CM

## 2019-09-26 DIAGNOSIS — F32A Depression, unspecified: Secondary | ICD-10-CM

## 2019-09-26 DIAGNOSIS — E038 Other specified hypothyroidism: Secondary | ICD-10-CM

## 2019-09-26 DIAGNOSIS — F329 Major depressive disorder, single episode, unspecified: Secondary | ICD-10-CM

## 2019-09-26 NOTE — Progress Notes (Signed)
Chronic Care Management Pharmacy  Name: Vanessa Christensen  MRN: [id number] DOB: 1950-07-07  Chief Complaint/ HPI  Vanessa Christensen,  69 y.o. , female presents for their Initial CCM visit with the clinical pharmacist via telephone due to COVID-19 Pandemic.  PCP : [name], MD  Their chronic conditions include: Hypothyroidism, Anxiety/Depression, Overactive bladder, Vitamin D Deficiency, Obesity, Pain  Office Visits:  06/04/19- Office visit with Dr. [name]- Presented with complaints of right foot and ankle pain beginning less than 1 week ago. Swelling and tenderness noted on physical exam. Start Meloxicam once daily. Recommended use ice, stretch, and change footwear. Referred to Sports Medicine.   Consult Visit:  09/16/19- Office visit w/ Dr. Pricilla Holm- Presented for follow up for obesity treatment plan. Pt reported 60 minutes of cardio/strength training 60 mins 5 times per week. Continue weight loss plan and exercise. Continue Vitamin D 50,000 units every week and follow up for routine testing 2-3 times per year.   08/26/19- Office visit w/ Dr. [name]- Presented for follow up for obesity treatment plan. Pt has lost 42 lbs since last visit on 08/13/18. Pt reported 45-60 min of cardio/strength training 4-5 times per week. Continue to focus on weight loss and exercise. Counseled on thyroid control.   07/02/19- Office visit w/ Dr. [name]- Presented for follow-up for right ankle swelling, foot pain, and left knee pain. Pt reported improvement in ankle pain with Voltaren use and wearing ankle brace. Also reported improvement in knee pain with Voltaren use. Follow up as needed  06/04/19- Office visit w/ Dr. [name]- c/o right foot and ankle pain; start home exercise program, Voltaren gel, compressive ankle sleeve and scaphoid pads. Follow up in 1 month or sooner if needed.   04/01/19- Telehealth visit w/ Dr. [name]- obesity f/u; mild knee pain with exercising. Continue exercise,weight loss, decreasing  simple carbs.   Medications: Outpatient Encounter Medications as of 09/26/2019  Medication Sig Note  . Ascorbic Acid (VITAMIN C ER PO) Take 1 tablet by mouth daily.    Marland Kitchen aspirin 81 MG tablet Take 81 mg by mouth daily.   . AZOPT 1 % ophthalmic suspension PLACE 1 DROP INTO BOTH EYES TWICE DAILY 12/02/2015: Received from: External Pharmacy  . b complex vitamins tablet Take 1 tablet by mouth daily.   Marland Kitchen escitalopram (LEXAPRO) 20 MG tablet TAKE 1 AND 1/2 TABLETS BY MOUTH DAILY   . levothyroxine (SYNTHROID) 75 MCG tablet TAKE 1 TABLET BY MOUTH EVERY DAY   . meloxicam (MOBIC) 15 MG tablet TAKE 1 TABLET BY MOUTH EVERY DAY (Patient taking differently: PRN)   . Multiple Vitamins-Minerals (MULTIVITAMIN ADULTS 50+ PO) Take 1 tablet by mouth daily.    . psyllium (METAMUCIL) 58.6 % powder Take 1 packet by mouth daily.    Marland Kitchen tolterodine (DETROL LA) 4 MG 24 hr capsule TAKE 1 CAPSULE BY MOUTH EVERY DAY   . VITAMIN D, CHOLECALCIFEROL, PO Take 25 mcg by mouth daily.    Marland Kitchen acetaminophen (TYLENOL) 325 MG tablet Take 650 mg by mouth every 6 (six) hours as needed.    No facility-administered encounter medications on file as of 09/26/2019.   Current Diagnosis/Assessment:  Goals Addressed            This Visit's Progress   . Anxiety/Depression: Minimize recurring symptoms       CARE PLAN ENTRY Current Barriers:  . Chronic Disease Management support, education, and care coordination needs related to anxiety/depression. o Currently taking escitalopram 30 mg daily.   Pharmacist Clinical Goal(s):  .  Over next 90 days: minimize recurring anxiety/depression symptoms.  Interventions: . Comprehensive medication review performed.  Patient Self Care Activities:  . Patient verbalizes understanding of plan to continue medication as prescribed over next 90 days. . Continue current medication and follow up as directed for counseling and/or medication changes.  . Please contact office with any worsening  symptoms. Initial goal documentation    . Overactive Bladder: Minimize recurring symptoms       CARE PLAN ENTRY Current Barriers:  . Chronic Disease Management support, education, and care coordination needs related to overactive bladder o Currently taking tolterodine ER 4 mg daily.  Pharmacist Clinical Goal(s):  Marland Kitchen Over next 90 days: Minimize recurring symptoms of overactive bladder  Interventions: . Comprehensive medication review performed. . Pharmacist will review alternatives with insurance and discuss f/u with Dr. [name] as appropriate.   Patient Self Care Activities:  . Patient verbalizes understanding of plan to take medication as prescribed over next 90 days unless directed otherwise. Please call with any questions!  Initial goal documentation     . TSH 0.35-4.5       CARE PLAN ENTRY Current Barriers:  . Chronic Disease Management support, education, and care coordination needs related to  hypothyroidism. o Currently taking levothyroxine 75 mcg daily.  Pharmacist Clinical Goal(s):  Marland Kitchen Over next 90 days: Maintain thyroid stimulating hormone (TSH) 0.35-4.5 .  Interventions: . Comprehensive medication review performed.  Patient Self Care Activities:  . Patient verbalizes understanding of plan to continue medication as prescribed over next 90 days. Please call with any questions! Initial goal documentation      Hypothyroidism   TSH  Date Value Ref Range Status  06/04/2019 1.09 0.35 - 4.50 uIU/mL Final   Patient is currently controlled on the following medications: Levothyroxine 75 mcg daily. We discussed hypothyroidism and I counseled on proper use. Denies feeling tired or sluggish but admits to feeling more depressed lately as discussed below.  Plan Continue current medications   Anxiety/Depression  Patient is currently uncontrolled on escitalopram 20 mg 1.5 tablets daily.  Reports worsening symptoms of depression, 3-5 episodes of feeling down every week. Has  been taking 30 mg daily, previously 20 mg daily. 15+ yr history of use with escitalopram. Denies previous trial of other SSRI or other antidepressants. Would like to consider alternative agents, seems to be open to behavioral therapy as this has helped her in the past.    Last TSH on file 12/30 1.09.   Plan Will discuss alternative SSRI and/or behavioral health referral as appropriate with Dr. [name].  Hyperlipidemia/ Obesity   Lipid Panel     Component Value Date/Time   CHOL 170 05/08/2018 0957   TRIG 114.0 05/08/2018 0957   HDL 43.40 05/08/2018 0957   CHOLHDL 4 05/08/2018 0957   VLDL 22.8 05/08/2018 0957   LDLCALC 103 (H) 05/08/2018 0957   The 10-year ASCVD risk score Mikey Bussing DC Jr., et al., 2013) is: 8.5%   Values used to calculate the score:     Age: 57 years     Sex: Female     Is Non-Hispanic African American: No     Diabetic: No     Tobacco smoker: No     Systolic Blood Pressure: 123XX123 mmHg     Is BP treated: No     HDL Cholesterol: 43.4 mg/dL     Total Cholesterol: 170 mg/dL   Patient is currently controlled on the following medications: Diet and exercise alone. Taking aspirin 81 mg daily for  CVD prevention. Denies any abnormal bruising, bleeding from nose or gums or blood in urine or stool. We discussed:  diet and exercise extensively. Lost 45 lbs since height of pandemic. Maintains 1 hour of exercise each day during the week. Reports 196 lbs 4/23. Currently on diet program with emphasized protein intake.    Plan Continue current medications and control with diet and exercise.   Overactive Bladder  Patient has failed these meds in past: Vesicare. Currently taking tolterodine ER 4mg  daily. States medication has help but urinary symptoms still present. Reports leaking at night most nights, routinely uses pad. Urinary urgency present multiple times throughout the day on most days.  We discussed: reviewing alternatives, would like to consider alternative with similar  copay.  Plan Continue current medications. Will review alternatives with insurance.  Vitamin D Deficiency  Vitamin D on 11/14/18: 42.4 ng/ml; 08/13/18: 35.0 ng/ml.  Patient previously treated with Ergocalciferol 50,0000 Units every 7 days. Patient is currently controlled on the following medications: Cholecalciferol 1000 units daily.  Plan Continue currently medication.   Knee Pain  Right knee replacement, does best to avoid weight bearing exercises. Patient is currently controlled on the following medications: Meloxicam 15 mg daily as needed - uses meloxicam or 200-800 mg ibuprofen twice weekly. Counseled at length regarding risk-benefit of NSAIDs. Votaren gel used topically, no more than every two weeks.   Plan Continue current medications.   Vaccines  Reviewed and discussed patient's vaccination history.    Immunization History  Administered Date(s) Administered  . Influenza, High Dose Seasonal PF 02/02/2019  . Influenza,inj,Quad PF,6+ Mos 04/16/2017, 02/01/2018  . Influenza-Unspecified 03/05/2016  . PFIZER SARS-COV-2 Vaccination 07/19/2019, 08/11/2019  . Pneumococcal-Unspecified 03/05/2016  . Tdap 12/01/2013  . Zoster 06/05/2008  . Zoster Recombinat (Shingrix) 09/07/2017, 01/25/2018   Plan No recommendations at this time.  Medication Management  Pt uses [facility] for all medications. Reports frustration in managing current medications, would like to consider alternative options. We discussed reviewing costs at other pharmacies.  Plan I will review copay information through Rx coverage and follow-up as appropriate.  Follow up: 3 month phone visit. _____________ Visit Information Ms. Korp was given information about Chronic Care Management services today including:  1. CCM service includes personalized support from designated clinical staff supervised by her physician, including individualized plan of care and coordination with other care providers 2. 24/7  contact phone numbers for assistance for urgent and routine care needs. 3. Standard insurance, coinsurance, copays and deductibles apply for chronic care management only during months in which we provide at least 20 minutes of these services. Most insurances cover these services at 100%, however patients may be responsible for any copay, coinsurance and/or deductible if applicable. This service may help you avoid the need for more expensive face-to-face services. 4. Only one practitioner may furnish and bill the service in a calendar month. 5. The patient may stop CCM services at any time (effective at the end of the month) by phone call to the office staff.  Patient agreed to services and verbal consent obtained.   Madelin Rear, Pharm.D. Clinical Pharmacist [facility] at [facility] [phone]

## 2019-09-26 NOTE — Patient Instructions (Addendum)
Visit Information  Goals Addressed            This Visit's Progress   . Anxiety/Depression: Minimize recurring symptoms       CARE PLAN ENTRY Current Barriers:  . Chronic Disease Management support, education, and care coordination needs related to anxiety/depression.  Pharmacist Clinical Goal(s):  Marland Kitchen Over next 90 days: minimize recurring anxiety/depression symptoms.  Interventions: . Comprehensive medication review performed.  Patient Self Care Activities:  . Patient verbalizes understanding of plan to continue medication as prescribed over next 90 days. . Continue current medication and follow up as directed for counseling and/or medication changes.  . Please contact office with any worsening symptoms. Initial goal documentation    . Overactive Bladder: Minimize recurring symptoms       CARE PLAN ENTRY Current Barriers:  . Chronic Disease Management support, education, and care coordination needs related to overactive bladder o Currently taking tolterodine ER 4 mg daily.  Pharmacist Clinical Goal(s):  Marland Kitchen Over next 90 days: Minimize recurring symptoms of overactive bladder  Interventions: . Comprehensive medication review performed. . Pharmacist will review alternatives with insurance and discuss f/u with Dr. [name] as appropriate.   Patient Self Care Activities:  . Patient verbalizes understanding of plan to take medication as prescribed over next 90 days unless directed otherwise. Please call with any questions!  Initial goal documentation     . TSH 0.35-4.5       CARE PLAN ENTRY Current Barriers:  . Chronic Disease Management support, education, and care coordination needs related to  hypothyroidism. o Currently taking 75 mcg daily.  Pharmacist Clinical Goal(s):  Marland Kitchen Over next 90 days: Maintain thyroid stimulating hormone (TSH) 0.35-4.5 .  Interventions: . Comprehensive medication review performed.  Patient Self Care Activities:  . Patient verbalizes  understanding of plan to continue medication as prescribed over next 90 days. Please call with any questions! Initial goal documentation      Ms. Scanlin was given information about Chronic Care Management services today including:  1. CCM service includes personalized support from designated clinical staff supervised by her physician, including individualized plan of care and coordination with other care providers 2. 24/7 contact phone numbers for assistance for urgent and routine care needs. 3. Standard insurance, coinsurance, copays and deductibles apply for chronic care management only during months in which we provide at least 20 minutes of these services. Most insurances cover these services at 100%, however patients may be responsible for any copay, coinsurance and/or deductible if applicable. This service may help you avoid the need for more expensive face-to-face services. 4. Only one practitioner may furnish and bill the service in a calendar month. 5. The patient may stop CCM services at any time (effective at the end of the month) by phone call to the office staff.  Patient agreed to services and verbal consent obtained.   The patient verbalized understanding of instructions provided today and agreed to receive a mailed copy of patient instruction and/or educational materials. Telephone follow up appointment with pharmacy team member scheduled for: See next appointment with "Care Management Staff" under "What's Next" below.   Thank you!  Vanessa Christensen, Pharm.D. Clinical Pharmacist [facility] [phone] Living With Depression Everyone experiences occasional disappointment, sadness, and loss in their lives. When you are feeling down, blue, or sad for at least 2 weeks in a row, it may mean that you have depression. Depression can affect your thoughts and feelings, relationships, daily activities, and physical health.  It is caused by changes in the  way your brain functions. If you receive a diagnosis of depression, your health care provider will tell you which type of depression you have and what treatment options are available to you. If you are living with depression, there are ways to help you recover from it and also ways to prevent it from coming back. How to cope with lifestyle changes Coping with stress     Stress is your body's reaction to life changes and events, both good and bad. Stressful situations may include:  Getting married.  The death of a spouse.  Losing a job.  Retiring.  Having a baby. Stress can last just a few hours or it can be ongoing. Stress can play a major role in depression, so it is important to learn both how to cope with stress and how to think about it differently. Talk with your health care provider or a counselor if you would like to learn more about stress reduction. He or she may suggest some stress reduction techniques, such as:  Music therapy. This can include creating music or listening to music. Choose music that you enjoy and that inspires you.  Mindfulness-based meditation. This kind of meditation can be done while sitting or walking. It involves being aware of your normal breaths, rather than trying to control your breathing.  Centering prayer. This is a kind of meditation that involves focusing on a spiritual word or phrase. Choose a word, phrase, or sacred image that is meaningful to you and that brings you peace.  Deep breathing. To do this, expand your stomach and inhale slowly through your nose. Hold your breath for 3-5 seconds, then exhale slowly, allowing your stomach muscles to relax.  Muscle relaxation. This involves intentionally tensing muscles then relaxing them. Choose a stress reduction technique that fits your lifestyle and personality. Stress reduction techniques take time and practice to develop. Set aside 5-15 minutes a day to do them. Therapists can offer training in  these techniques. The training may be covered by some insurance plans. Other things you can do to manage stress include:  Keeping a stress diary. This can help you learn what triggers your stress and ways to control your response.  Understanding what your limits are and saying no to requests or events that lead to a schedule that is too full.  Thinking about how you respond to certain situations. You may not be able to control everything, but you can control how you react.  Adding humor to your life by watching funny films or TV shows.  Making time for activities that help you relax and not feeling guilty about spending your time this way.  Medicines Your health care provider may suggest certain medicines if he or she feels that they will help improve your condition. Avoid using alcohol and other substances that may prevent your medicines from working properly (may interact). It is also important to:  Talk with your pharmacist or health care provider about all the medicines that you take, their possible side effects, and what medicines are safe to take together.  Make it your goal to take part in all treatment decisions (shared decision-making). This includes giving input on the side effects of medicines. It is best if shared decision-making with your health care provider is part of your total treatment plan. If your health care provider prescribes a medicine, you may not notice the full benefits of it for 4-8 weeks. Most people who are treated  for depression need to be on medicine for at least 6-12 months after they feel better. If you are taking medicines as part of your treatment, do not stop taking medicines without first talking to your health care provider. You may need to have the medicine slowly decreased (tapered) over time to decrease the risk of harmful side effects. Relationships Your health care provider may suggest family therapy along with individual therapy and drug therapy. While  there may not be family problems that are causing you to feel depressed, it is still important to make sure your family learns as much as they can about your mental health. Having your family's support can help make your treatment successful. How to recognize changes in your condition Everyone has a different response to treatment for depression. Recovery from major depression happens when you have not had signs of major depression for two months. This may mean that you will start to:  Have more interest in doing activities.  Feel less hopeless than you did 2 months ago.  Have more energy.  Overeat less often, or have better or improving appetite.  Have better concentration. Your health care provider will work with you to decide the next steps in your recovery. It is also important to recognize when your condition is getting worse. Watch for these signs:  Having fatigue or low energy.  Eating too much or too little.  Sleeping too much or too little.  Feeling restless, agitated, or hopeless.  Having trouble concentrating or making decisions.  Having unexplained physical complaints.  Feeling irritable, angry, or aggressive. Get help as soon as you or your family members notice these symptoms coming back. How to get support and help from others How to talk with friends and family members about your condition  Talking to friends and family members about your condition can provide you with one way to get support and guidance. Reach out to trusted friends or family members, explain your symptoms to them, and let them know that you are working with a health care provider to treat your depression. Financial resources Not all insurance plans cover mental health care, so it is important to check with your insurance carrier. If paying for co-pays or counseling services is a problem, search for a local or county mental health care center. They may be able to offer public mental health care  services at low or no cost when you are not able to see a private health care provider. If you are taking medicine for depression, you may be able to get the generic form, which may be less expensive. Some makers of prescription medicines also offer help to patients who cannot afford the medicines they need. Follow these instructions at home:   Get the right amount and quality of sleep.  Cut down on using caffeine, tobacco, alcohol, and other potentially harmful substances.  Try to exercise, such as walking or lifting small weights.  Take over-the-counter and prescription medicines only as told by your health care provider.  Eat a healthy diet that includes plenty of vegetables, fruits, whole grains, low-fat dairy products, and lean protein. Do not eat a lot of foods that are high in solid fats, added sugars, or salt.  Keep all follow-up visits as told by your health care provider. This is important. Contact a health care provider if:  You stop taking your antidepressant medicines, and you have any of these symptoms: ? Nausea. ? Headache. ? Feeling lightheaded. ? Chills and body aches. ?  Not being able to sleep (insomnia).  You or your friends and family think your depression is getting worse. Get help right away if:  You have thoughts of hurting yourself or others. If you ever feel like you may hurt yourself or others, or have thoughts about taking your own life, get help right away. You can go to your nearest emergency department or call:  Your local emergency services (911 in the [place]  A suicide crisis helpline, such as the [facility] at 434-527-6673. This is open 24-hours a day. Summary  If you are living with depression, there are ways to help you recover from it and also ways to prevent it from coming back.  Work with your health care team to create a management plan that includes counseling, stress management techniques, and healthy lifestyle  habits. This information is not intended to replace advice given to you by your health care provider. Make sure you discuss any questions you have with your health care provider. Document Revised: 09/13/2018 Document Reviewed: 04/24/2016 Elsevier Patient Education  [place].

## 2019-09-30 ENCOUNTER — Ambulatory Visit (INDEPENDENT_AMBULATORY_CARE_PROVIDER_SITE_OTHER): Payer: Medicare Other | Admitting: Bariatrics

## 2019-09-30 ENCOUNTER — Other Ambulatory Visit: Payer: Self-pay

## 2019-09-30 ENCOUNTER — Encounter (INDEPENDENT_AMBULATORY_CARE_PROVIDER_SITE_OTHER): Payer: Self-pay | Admitting: Bariatrics

## 2019-09-30 VITALS — BP 135/71 | HR 78 | Temp 98.7°F | Ht 62.0 in | Wt 196.0 lb

## 2019-09-30 DIAGNOSIS — E8881 Metabolic syndrome: Secondary | ICD-10-CM

## 2019-09-30 DIAGNOSIS — Z6836 Body mass index (BMI) 36.0-36.9, adult: Secondary | ICD-10-CM | POA: Diagnosis not present

## 2019-09-30 DIAGNOSIS — E039 Hypothyroidism, unspecified: Secondary | ICD-10-CM | POA: Diagnosis not present

## 2019-10-01 NOTE — Progress Notes (Signed)
Chief Complaint:   OBESITY Vanessa Christensen is here to discuss her progress with her obesity treatment plan along with follow-up of her obesity related diagnoses. Vanessa Christensen is on the Category 3 Plan and states she is following her eating plan approximately 80% of the time. Vanessa Christensen states she is doing cardio/weights 60 minutes 5 times per week.  Today's visit was #: 26 Starting weight: 241 lbs Starting date: 08/13/2018 Today's weight: 196 lbs Today's date: 09/30/2019 Total lbs lost to date: 45 Total lbs lost since last in-office visit: 2  Interim History: Vanessa Christensen is down 2 lbs and has done well overall.  Subjective:   Hypothyroidism, unspecified type. Vanessa Christensen is taking Synthroid.   Lab Results  Component Value Date   TSH 1.09 06/04/2019   Insulin resistance. Vanessa Christensen has a diagnosis of insulin resistance based on her elevated fasting insulin level >5. She continues to work on diet and exercise to decrease her risk of diabetes. She is on no medication. No polyphagia.  Lab Results  Component Value Date   INSULIN 10.9 11/04/2018   INSULIN 13.2 08/13/2018   Lab Results  Component Value Date   HGBA1C 5.4 11/04/2018   Assessment/Plan:   Hypothyroidism, unspecified type. Patient with long-standing hypothyroidism, on levothyroxine therapy. She appears euthyroid. Orders and follow up as documented in patient record. Vanessa Christensen will continue her medication as directed.  Counseling . Good thyroid control is important for overall health. Supratherapeutic thyroid levels are dangerous and will not improve weight loss results. . The correct way to take levothyroxine is fasting, with water, separated by at least 30 minutes from breakfast, and separated by more than 4 hours from calcium, iron, multivitamins, acid reflux medications (PPIs).   Insulin resistance. Vanessa Christensen will continue to work on weight loss, exercise, and decreasing simple carbohydrates to help decrease the risk of diabetes. Vanessa Christensen  agreed to follow-up with [name] as directed to closely monitor her progress. She will keep her refined carbohydrates and sweets to a minimum.  Class 2 severe obesity with serious comorbidity and body mass index (BMI) of 36.0 to 36.9 in adult, unspecified obesity type ([place]).  Vanessa Christensen is currently in the action stage of change. As such, her goal is to continue with weight loss efforts. She has agreed to the Category 3 Plan.   She will work on meal planning and intentional eating.  Exercise goals: Vanessa Christensen will continue her current workout.  Behavioral modification strategies: increasing lean protein intake, decreasing simple carbohydrates, increasing vegetables, increasing water intake, decreasing eating out, no skipping meals, meal planning and cooking strategies and keeping healthy foods in the home.  Vanessa Christensen has agreed to follow-up with our clinic in 2 weeks. She was informed of the importance of frequent follow-up visits to maximize her success with intensive lifestyle modifications for her multiple health conditions.   Objective:   Blood pressure 135/71, pulse 78, temperature 98.7 F (37.1 C), height 5\' 2"  (1.575 m), weight 196 lb (88.9 kg), SpO2 97 %. Body mass index is 35.85 kg/m.  General: Cooperative, alert, well developed, in no acute distress. HEENT: Conjunctivae and lids unremarkable. Cardiovascular: Regular rhythm.  Lungs: Normal work of breathing. Neurologic: No focal deficits.   Lab Results  Component Value Date   CREATININE 0.75 07/31/2019   BUN 23 07/31/2019   NA 141 07/31/2019   K 4.6 07/31/2019   CL 107 07/31/2019   CO2 26 07/31/2019   Lab Results  Component Value Date   ALT 22 07/31/2019   AST 18  07/31/2019   ALKPHOS 69 07/31/2019   BILITOT 0.4 07/31/2019   Lab Results  Component Value Date   HGBA1C 5.4 11/04/2018   HGBA1C 5.6 08/13/2018   Lab Results  Component Value Date   INSULIN 10.9 11/04/2018   INSULIN 13.2 08/13/2018   Lab Results  Component  Value Date   TSH 1.09 06/04/2019   Lab Results  Component Value Date   CHOL 170 05/08/2018   HDL 43.40 05/08/2018   LDLCALC 103 (H) 05/08/2018   TRIG 114.0 05/08/2018   CHOLHDL 4 05/08/2018   Lab Results  Component Value Date   WBC 5.5 07/31/2019   HGB 12.0 07/31/2019   HCT 38.3 07/31/2019   MCV 90.8 07/31/2019   PLT 167 07/31/2019   No results found for: IRON, TIBC, FERRITIN  Obesity Behavioral Intervention Documentation for Insurance:   Approximately 15 minutes were spent on the discussion below.  ASK: We discussed the diagnosis of obesity with Vanessa Christensen today and Vanessa Christensen agreed to give [name] permission to discuss obesity behavioral modification therapy today.  ASSESS: Vanessa Christensen has the diagnosis of obesity and her BMI today is 36.0. Vanessa Christensen is in the action stage of change.   ADVISE: Vanessa Christensen was educated on the multiple health risks of obesity as well as the benefit of weight loss to improve her health. She was advised of the need for long term treatment and the importance of lifestyle modifications to improve her current health and to decrease her risk of future health problems.  AGREE: Multiple dietary modification options and treatment options were discussed and Vanessa Christensen agreed to follow the recommendations documented in the above note.  ARRANGE: Vanessa Christensen was educated on the importance of frequent visits to treat obesity as outlined per CMS and USPSTF guidelines and agreed to schedule her next follow up appointment today.  Attestation Statements:   Reviewed by clinician on day of visit: allergies, medications, problem list, medical history, surgical history, family history, social history, and previous encounter notes.  [name], am acting as Location manager for [employer], DO   I have reviewed the above documentation for accuracy and completeness, and I agree with the above. Jearld Lesch, DO

## 2019-10-02 ENCOUNTER — Encounter (INDEPENDENT_AMBULATORY_CARE_PROVIDER_SITE_OTHER): Payer: Self-pay | Admitting: Bariatrics

## 2019-10-02 ENCOUNTER — Other Ambulatory Visit: Payer: Self-pay

## 2019-10-02 ENCOUNTER — Encounter: Payer: Self-pay | Admitting: Family Medicine

## 2019-10-02 ENCOUNTER — Ambulatory Visit (INDEPENDENT_AMBULATORY_CARE_PROVIDER_SITE_OTHER): Payer: Medicare Other | Admitting: Family Medicine

## 2019-10-02 VITALS — BP 129/70 | HR 90 | Temp 97.6°F | Resp 16 | Ht 62.0 in | Wt 201.0 lb

## 2019-10-02 DIAGNOSIS — F419 Anxiety disorder, unspecified: Secondary | ICD-10-CM | POA: Diagnosis not present

## 2019-10-02 DIAGNOSIS — F32A Depression, unspecified: Secondary | ICD-10-CM

## 2019-10-02 DIAGNOSIS — M19049 Primary osteoarthritis, unspecified hand: Secondary | ICD-10-CM

## 2019-10-02 DIAGNOSIS — F329 Major depressive disorder, single episode, unspecified: Secondary | ICD-10-CM

## 2019-10-02 MED ORDER — FLUOXETINE HCL 20 MG PO CAPS: 20.0000 mg | ORAL_CAPSULE | Freq: Every day | ORAL | 3 refills | Status: DC

## 2019-10-02 NOTE — Assessment & Plan Note (Signed)
Deteriorated.  She is now having ~3 days/week of 'eeyore days'.  This indicates current medication is ineffective.  Will switch to Prozac and monitor closely for symptom improvement.  Pt expressed understanding and is in agreement w/ plan.

## 2019-10-02 NOTE — Progress Notes (Signed)
   Subjective:    Patient ID: Vanessa Christensen, female    DOB: 21-Mar-1951, 69 y.o.   MRN: [id number]  HPI Depression- chronic problem, currently on Lexapro 30mg  daily.  Pt reports she will feel ok and 'then all of a sudden it's like having a black cloud hanging over you'.  'it just comes out of nowhere'.  Can last 1/2 day or longer.  Has been on current meds x35 yrs.  Occurring ~3x/week.  Pt is also struggling w/ low motivation at times.  Feels that after all these years, may need to switch meds.  Bilateral hand arthritis- pt exercises 5x/week.  At times will have pain that wakes her from sleep.  Was previously in talks to have hand surgery for known arthritis.   Review of Systems For ROS see HPI   This visit occurred during the SARS-CoV-2 public health emergency.  Safety protocols were in place, including screening questions prior to the visit, additional usage of staff PPE, and extensive cleaning of exam room while observing appropriate contact time as indicated for disinfecting solutions.       Objective:   Physical Exam Vitals reviewed.  Constitutional:      General: She is not in acute distress.    Appearance: Normal appearance. She is not ill-appearing.  HENT:     Head: Normocephalic and atraumatic.  Skin:    General: Skin is warm and dry.  Neurological:     General: No focal deficit present.     Mental Status: She is alert and oriented to person, place, and time.  Psychiatric:        Mood and Affect: Mood normal.        Behavior: Behavior normal.        Thought Content: Thought content normal.           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  Pt to restart daily Meloxicam (she has this available at home).  Will refer to hand specialist as pt was previously scheduled to have surgery

## 2019-10-02 NOTE — Patient Instructions (Signed)
Follow up in 1 month to recheck mood STOP the Lexapro START the Fluoxetine (Prozac) We'll call you with your hand referral Call with any questions or concerns Hang in there!!!

## 2019-10-10 ENCOUNTER — Encounter: Payer: Self-pay | Admitting: Family Medicine

## 2019-10-10 ENCOUNTER — Other Ambulatory Visit: Payer: Self-pay

## 2019-10-10 ENCOUNTER — Ambulatory Visit (INDEPENDENT_AMBULATORY_CARE_PROVIDER_SITE_OTHER): Payer: Medicare Other | Admitting: Family Medicine

## 2019-10-10 DIAGNOSIS — M180 Bilateral primary osteoarthritis of first carpometacarpal joints: Secondary | ICD-10-CM | POA: Diagnosis not present

## 2019-10-10 DIAGNOSIS — M25561 Pain in right knee: Secondary | ICD-10-CM

## 2019-10-10 NOTE — Progress Notes (Signed)
Office Visit Note   Patient: Vanessa Christensen           Date of Birth: [birth year]           MRN: [id number] Visit Date: 10/10/2019 Requested by: [name], MD [id number] [name] [address] [place],  [place] [address] PCP: [name], MD  Subjective: Chief Complaint  Patient presents with  . Left Hand - Pain    Pain in the [facility] joint bilateral hands, left greater than right. H/o numerous trigger finger releases and bilateral carpal tunnel releases.  . Right Hand - Pain    HPI: She is here with bilateral hand pain.  She has had longstanding problems with her hands over the years.  She previously lived in [place], [place] and saw a Copy there.  She had trigger finger releases on almost all of her fingers.  She had bilateral carpal tunnel release.  When she moved 5 years ago, she was told that she might eventually need [place] surgery.  She has managed her pain with Voltaren gel.  She takes ibuprofen occasionally, and is now on meloxicam.  These things help but she is currently exercising regularly and trying to lose weight, she does a lot of weight training and this seems to bother her thumbs.  Denies any numbness or tingling.  She is also status post right knee replacement in 2014.  Lately it has become a little bit stiff and uncomfortable.  Overall she is done well with it.               ROS:   All other systems were reviewed and are negative.  Objective: Vital Signs: There were no vitals taken for this visit.  Physical Exam:  General:  Alert and oriented, in no acute distress. Pulm:  Breathing unlabored. Psy:  Normal mood, congruent affect. Skin: No rash or erythema. Hands: She has a nodule at the A1 pulley of the left thumb but no triggering.  She has negative Tinel's carpal tunnel, negative Phalen's test bilaterally.  No thenar atrophy.  Both thumbs are tender at the [facility] joint and at the MCP joint.  She has a small effusion in the left thumb MCP.  Positive grind test  at the [facility] joints bilaterally. Right knee: 1+ effusion with no warmth.  Full active extension, flexion of 120 degrees.  Slightly tender on the medial joint line.  Imaging: No results found.  Assessment & Plan: 1.  Bilateral thumb CMC arthrosis -CMC braces given today to wear during activity. -Trial of glucosamine and turmeric. -Future options would include hand therapy referral, cortisone injection, dextrose prolotherapy, PRP injections or CMC surgery.  2.  Right knee effusion status post replacement -She will try glucosamine and turmeric as above.  If symptoms persist we will obtain new two-view x-rays.     Procedures: No procedures performed  No notes on file     PMFS History: Patient Active Problem List   Diagnosis Date Noted  . Vitamin D deficiency 08/29/2018  . 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 Class 3 severe obesity with serious comorbidity and body mass index (BMI) of 40.0 to 44.9 in adult ([place]) 08/14/2018  . 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 Incontinence of urine in female 11/07/2017  . Vertigo 11/07/2017  . Left knee pain 10/25/2016  . Genetic testing 08/08/2016  . Hypothyroidism 12/02/2015  . Anxiety and depression 12/02/2015  . OAB (overactive bladder) 12/02/2015  . Breast cancer of upper-outer quadrant of left female  breast ([place]) 12/02/2015  . Obesity (BMI 30-39.9) 12/02/2015   Past Medical History:  Diagnosis Date  . Anemia    hx of  . Anxiety    pt denies  . Arthritis    hands and feet  . Cancer ([place]) 10/04/2006   breast  . Cataracts, both eyes   . Depression   . Depression   . Diverticulosis of colon   . Glaucoma   . Heart murmur   . Joint pain   . Knee pain   . Obesity   . OSA on CPAP   . Osteoarthritis   . Sleep apnea    no CPAP- no longer needed d/t weight loss  . Thyroid activity decreased     Family History  Problem Relation Age of Onset  . CVA Mother   . Cancer Mother 29       breast cancer   . Heart disease Mother   .  Stroke Mother   . Obesity Mother   . Leukemia Father   . High blood pressure Father   . High Cholesterol Father   . Heart disease Father   . Cancer Maternal Aunt 55       breast cancer   . Cancer Maternal Aunt 55       breast cancer  . Colon cancer Maternal Aunt   . Cancer Cousin 32       breast cancer  . Esophageal cancer Neg Hx   . Rectal cancer Neg Hx   . Stomach cancer Neg Hx     Past Surgical History:  Procedure Laterality Date  . BREAST SURGERY Bilateral   . CATARACT EXTRACTION    . COLONOSCOPY    . GLAUCOMA REPAIR    . MASTECTOMY, RADICAL Bilateral   . neulasta induced sterile abscesses    . THYROIDECTOMY, PARTIAL    . TOTAL KNEE ARTHROPLASTY Right    Social History   Occupational History  . Occupation: Retired    Comment: Pharmacist, hospital  Tobacco Use  . Smoking status: Former Smoker    Quit date: 11/03/1978    Years since quitting: 40.9  . Smokeless tobacco: Never Used  Substance and Sexual Activity  . Alcohol use: No  . Drug use: No  . Sexual activity: Not Currently

## 2019-10-10 NOTE — Patient Instructions (Addendum)
    Hands:   - CMC braces during activity - Glucosamine Sulfate 1,000 mg twice daily - Turmeric 500 mg twice daily  Future options for hands:  - Cortisone injection - Dextrose injection (prolotherapy) - PRP (platelet rich plasma) injection - Surgery

## 2019-10-21 ENCOUNTER — Other Ambulatory Visit: Payer: Self-pay

## 2019-10-21 ENCOUNTER — Encounter (INDEPENDENT_AMBULATORY_CARE_PROVIDER_SITE_OTHER): Payer: Self-pay | Admitting: Bariatrics

## 2019-10-21 ENCOUNTER — Ambulatory Visit (INDEPENDENT_AMBULATORY_CARE_PROVIDER_SITE_OTHER): Payer: Medicare Other | Admitting: Bariatrics

## 2019-10-21 VITALS — BP 134/84 | HR 98 | Temp 98.3°F | Ht 62.0 in | Wt 196.0 lb

## 2019-10-21 DIAGNOSIS — Z6835 Body mass index (BMI) 35.0-35.9, adult: Secondary | ICD-10-CM | POA: Diagnosis not present

## 2019-10-21 DIAGNOSIS — E038 Other specified hypothyroidism: Secondary | ICD-10-CM | POA: Diagnosis not present

## 2019-10-21 DIAGNOSIS — K5909 Other constipation: Secondary | ICD-10-CM | POA: Diagnosis not present

## 2019-10-21 NOTE — Progress Notes (Signed)
Chief Complaint:   OBESITY Vanessa Christensen is here to discuss her progress with her obesity treatment plan along with follow-up of her obesity related diagnoses. Vanessa Christensen is on the Category 3 Plan and states she is following her eating plan approximately 75% of the time. Vanessa Christensen states she is doing weights/cardio 45-60 minutes 5 times per week.  Today's visit was #: 78 Starting weight: 241 lbs Starting date: 08/13/2018 Today's weight: 196 lbs Today's date: 10/21/2019 Total lbs lost to date: 45 Total lbs lost since last in-office visit: 0  Interim History: Vanessa Christensen is doing well and has stayed at the same weight. Her exercise has been good. She states breakfast and lunch are good.  Subjective:   Other specified hypothyroidism. Vanessa Christensen is taking Synthroid.  Lab Results  Component Value Date   TSH 1.09 06/04/2019   Other constipation. Vanessa Christensen is taking Metamucil for constipation.  Assessment/Plan:   Other specified hypothyroidism. Patient with long-standing hypothyroidism, on levothyroxine therapy. She appears euthyroid. Orders and follow up as documented in patient record. Vanessa Christensen will continue Synthroid as directed.  Counseling . Good thyroid control is important for overall health. Supratherapeutic thyroid levels are dangerous and will not improve weight loss results. . The correct way to take levothyroxine is fasting, with water, separated by at least 30 minutes from breakfast, and separated by more than 4 hours from calcium, iron, multivitamins, acid reflux medications (PPIs).   Other constipation. Vanessa Christensen was informed that a decrease in bowel movement frequency is normal while losing weight, but stools should not be hard or painful. Orders and follow up as documented in patient record. She will continue Metamucil as directed and will keep her water intake high.  Counseling Getting to Good Bowel Health: Your goal is to have one soft bowel movement each day. Drink at least 8 glasses  of water each day. Eat plenty of fiber (goal is over 25 grams each day). It is best to get most of your fiber from dietary sources which includes leafy green vegetables, fresh fruit, and whole grains. You may need to add fiber with the help of OTC fiber supplements. These include Metamucil, Citrucel, and Flaxseed. If you are still having trouble, try adding Miralax or Magnesium Citrate. If all of these changes do not work, Cabin crew.  Class 2 severe obesity with serious comorbidity and body mass index (BMI) of 35.0 to 35.9 in adult, unspecified obesity type ([place]).  Vanessa Christensen is currently in the action stage of change. As such, her goal is to continue with weight loss efforts. She has agreed to the Category 3 Plan.   She will work on meal planning, intentional eating, start writing down her food, and weighing her meat.  Exercise goals: Vanessa Christensen will continue her current exercise regimen.  Behavioral modification strategies: increasing lean protein intake, decreasing simple carbohydrates, increasing vegetables, increasing water intake, decreasing eating out, no skipping meals, meal planning and cooking strategies, keeping healthy foods in the home and planning for success.  Vanessa Christensen has agreed to follow-up with our clinic in 2 weeks. She was informed of the importance of frequent follow-up visits to maximize her success with intensive lifestyle modifications for her multiple health conditions.   Objective:   Blood pressure 134/84, pulse 98, temperature 98.3 F (36.8 C), height 5\' 2"  (1.575 m), weight 196 lb (88.9 kg), SpO2 97 %. Body mass index is 35.85 kg/m.  General: Cooperative, alert, well developed, in no acute distress. HEENT: Conjunctivae and lids unremarkable. Cardiovascular: Regular rhythm.  Lungs: Normal work of breathing. Neurologic: No focal deficits.   Lab Results  Component Value Date   CREATININE 0.75 07/31/2019   BUN 23 07/31/2019   NA 141 07/31/2019   K 4.6  07/31/2019   CL 107 07/31/2019   CO2 26 07/31/2019   Lab Results  Component Value Date   ALT 22 07/31/2019   AST 18 07/31/2019   ALKPHOS 69 07/31/2019   BILITOT 0.4 07/31/2019   Lab Results  Component Value Date   HGBA1C 5.4 11/04/2018   HGBA1C 5.6 08/13/2018   Lab Results  Component Value Date   INSULIN 10.9 11/04/2018   INSULIN 13.2 08/13/2018   Lab Results  Component Value Date   TSH 1.09 06/04/2019   Lab Results  Component Value Date   CHOL 170 05/08/2018   HDL 43.40 05/08/2018   LDLCALC 103 (H) 05/08/2018   TRIG 114.0 05/08/2018   CHOLHDL 4 05/08/2018   Lab Results  Component Value Date   WBC 5.5 07/31/2019   HGB 12.0 07/31/2019   HCT 38.3 07/31/2019   MCV 90.8 07/31/2019   PLT 167 07/31/2019   No results found for: IRON, TIBC, FERRITIN  Obesity Behavioral Intervention Documentation for Insurance:   Approximately 15 minutes were spent on the discussion below.  ASK: We discussed the diagnosis of obesity with Vanessa Christensen today and Vanessa Christensen agreed to give [name] permission to discuss obesity behavioral modification therapy today.  ASSESS: Vanessa Christensen has the diagnosis of obesity and her BMI today is 35.8. Vanessa Christensen is in the action stage of change.   ADVISE: Vanessa Christensen was educated on the multiple health risks of obesity as well as the benefit of weight loss to improve her health. She was advised of the need for long term treatment and the importance of lifestyle modifications to improve her current health and to decrease her risk of future health problems.  AGREE: Multiple dietary modification options and treatment options were discussed and Vanessa Christensen agreed to follow the recommendations documented in the above note.  ARRANGE: Vanessa Christensen was educated on the importance of frequent visits to treat obesity as outlined per CMS and USPSTF guidelines and agreed to schedule her next follow up appointment today.  Attestation Statements:   Reviewed by clinician on day of visit: allergies,  medications, problem list, medical history, surgical history, family history, social history, and previous encounter notes.  [name], am acting as Location manager for [employer], DO   I have reviewed the above documentation for accuracy and completeness, and I agree with the above. Jearld Lesch, DO

## 2019-10-22 ENCOUNTER — Encounter (INDEPENDENT_AMBULATORY_CARE_PROVIDER_SITE_OTHER): Payer: Self-pay | Admitting: Bariatrics

## 2019-10-25 ENCOUNTER — Other Ambulatory Visit: Payer: Self-pay | Admitting: Family Medicine

## 2019-11-06 ENCOUNTER — Encounter: Payer: Self-pay | Admitting: Family Medicine

## 2019-11-06 ENCOUNTER — Ambulatory Visit (INDEPENDENT_AMBULATORY_CARE_PROVIDER_SITE_OTHER): Payer: Medicare Other | Admitting: Family Medicine

## 2019-11-06 ENCOUNTER — Other Ambulatory Visit: Payer: Self-pay

## 2019-11-06 VITALS — BP 136/86 | HR 80 | Temp 97.9°F | Resp 16 | Ht 62.0 in | Wt 201.0 lb

## 2019-11-06 DIAGNOSIS — F329 Major depressive disorder, single episode, unspecified: Secondary | ICD-10-CM | POA: Diagnosis not present

## 2019-11-06 DIAGNOSIS — F32A Depression, unspecified: Secondary | ICD-10-CM

## 2019-11-06 DIAGNOSIS — F419 Anxiety disorder, unspecified: Secondary | ICD-10-CM

## 2019-11-06 NOTE — Progress Notes (Signed)
   Subjective:    Patient ID: Vanessa Christensen, female    DOB: 23-Aug-1950, 69 y.o.   MRN: [id number]  HPI Anxiety/Depression- at last visit we switched to Prozac from [place].  Pt feels a lot of her symptoms are 'anxiety b/c my life is so full'  Feels 'too many people rely on me'.  Doesn't want to disappoint anyone.  Has a hard time saying 'no' or setting boundaries.  Less 'Eey[place] days'.  Pt finds gym very helpful.   Review of Systems For ROS see HPI   This visit occurred during the SARS-CoV-2 public health emergency.  Safety protocols were in place, including screening questions prior to the visit, additional usage of staff PPE, and extensive cleaning of exam room while observing appropriate contact time as indicated for disinfecting solutions.       Objective:   Physical Exam Vitals reviewed.  Constitutional:      Appearance: Normal appearance.  HENT:     Head: Normocephalic and atraumatic.  Skin:    General: Skin is warm and dry.  Neurological:     General: No focal deficit present.     Mental Status: She is alert and oriented to person, place, and time.  Psychiatric:        Mood and Affect: Mood normal.        Behavior: Behavior normal.        Thought Content: Thought content normal.           Assessment & Plan:

## 2019-11-06 NOTE — Patient Instructions (Signed)
Follow up by phone or MyChart in 3-4 weeks to let me know how the anxiety is doing INCREASE the Fluoxetine to 40mg  daily (2 of what you have at home) Make sure you are taking time for you!  You can't do for others if your cup is empty Call with any questions or concerns Hang in there!

## 2019-11-06 NOTE — Assessment & Plan Note (Signed)
Pt reports feeling better but she continues to struggle w/ anxiety b/c she has trouble saying no and setting boundaries.  Discussed some strategies to help her feel more comfortable setting limits and taking care of herself first.  Will increase Prozac to 40mg  daily and monitor for improvement.  Pt expressed understanding and is in agreement w/ plan.

## 2019-11-18 ENCOUNTER — Ambulatory Visit (INDEPENDENT_AMBULATORY_CARE_PROVIDER_SITE_OTHER): Payer: Medicare Other | Admitting: Bariatrics

## 2019-11-18 ENCOUNTER — Encounter (INDEPENDENT_AMBULATORY_CARE_PROVIDER_SITE_OTHER): Payer: Self-pay | Admitting: Bariatrics

## 2019-11-18 ENCOUNTER — Other Ambulatory Visit: Payer: Self-pay

## 2019-11-18 VITALS — BP 149/65 | HR 84 | Temp 98.5°F | Ht 62.0 in | Wt 196.0 lb

## 2019-11-18 DIAGNOSIS — E8881 Metabolic syndrome: Secondary | ICD-10-CM | POA: Diagnosis not present

## 2019-11-18 DIAGNOSIS — E038 Other specified hypothyroidism: Secondary | ICD-10-CM | POA: Diagnosis not present

## 2019-11-18 DIAGNOSIS — Z6835 Body mass index (BMI) 35.0-35.9, adult: Secondary | ICD-10-CM | POA: Diagnosis not present

## 2019-11-19 ENCOUNTER — Encounter (INDEPENDENT_AMBULATORY_CARE_PROVIDER_SITE_OTHER): Payer: Self-pay | Admitting: Bariatrics

## 2019-11-19 NOTE — Progress Notes (Signed)
Chief Complaint:   OBESITY Vanessa Christensen is here to discuss her progress with her obesity treatment plan along with follow-up of her obesity related diagnoses. Vanessa Christensen is on the Category 3 Plan and states she is following her eating plan approximately 40% of the time. Vanessa Christensen states she is doing cardio and strength training for 45-60 minutes 5 times per week.  Today's visit was #: 28 Starting weight: 241 lbs Starting date: 08/13/2018 Today's weight: 196 lbs Today's date: 11/18/2019 Total lbs lost to date: 45 lbs Total lbs lost since last in-office visit: 0  Interim History: Vanessa Christensen says that she has been under a lot of stress.  She is doing better with her water and is working on her protein.  Subjective:   1. Other specified hypothyroidism Vanessa Christensen is taking 75 mcg daily.  Lab Results  Component Value Date   TSH 1.09 06/04/2019   2. Insulin resistance Vanessa Christensen has a diagnosis of insulin resistance based on her elevated fasting insulin level >5. She continues to work on diet and exercise to decrease her risk of diabetes.  She is on no medications.  Lab Results  Component Value Date   INSULIN 10.9 11/04/2018   INSULIN 13.2 08/13/2018   Lab Results  Component Value Date   HGBA1C 5.4 11/04/2018   Assessment/Plan:   1. Other specified hypothyroidism Patient with long-standing hypothyroidism, on levothyroxine therapy. She appears euthyroid. Orders and follow up as documented in patient record.  Continue levothyroxine.  Counseling . Good thyroid control is important for overall health. Supratherapeutic thyroid levels are dangerous and will not improve weight loss results. . The correct way to take levothyroxine is fasting, with water, separated by at least 30 minutes from breakfast, and separated by more than 4 hours from calcium, iron, multivitamins, acid reflux medications (PPIs).   2. Insulin resistance Vanessa Christensen will continue to work on weight loss, exercise, and decreasing simple  carbohydrates to help decrease the risk of diabetes. Vanessa Christensen agreed to follow-up with [name] as directed to closely monitor her progress. Decrease carbohydrates and increase healthy foods (healthy fats and protein).  3. Class 2 severe obesity with serious comorbidity and body mass index (BMI) of 35.0 to 35.9 in adult, unspecified obesity type Vanessa Christensen) Vanessa Christensen is currently in the action stage of change. As such, her goal is to continue with weight loss efforts. She has agreed to the Category 3 Plan.   Exercise goals: Going to the gym 5 days a week.  Behavioral modification strategies: increasing lean protein intake, decreasing simple carbohydrates, increasing vegetables, increasing water intake, decreasing eating out, no skipping meals, meal planning and cooking strategies, keeping healthy foods in the home and planning for success.  Vanessa Christensen will work on meal planning and intentional eating.  Discussed eating out and increasing protein intake.  Vanessa Christensen has agreed to follow-up with our clinic in 2 weeks. She was informed of the importance of frequent follow-up visits to maximize her success with intensive lifestyle modifications for her multiple health conditions.   Objective:   Blood pressure (!) 149/65, pulse 84, temperature 98.5 F (36.9 C), height 5\' 2"  (1.575 m), weight 196 lb (88.9 kg), SpO2 97 %. Body mass index is 35.85 kg/m.  General: Cooperative, alert, well developed, in no acute distress. HEENT: Conjunctivae and lids unremarkable. Cardiovascular: Regular rhythm.  Lungs: Normal work of breathing. Neurologic: No focal deficits.   Lab Results  Component Value Date   CREATININE 0.75 07/31/2019   BUN 23 07/31/2019   NA 141 07/31/2019  K 4.6 07/31/2019   CL 107 07/31/2019   CO2 26 07/31/2019   Lab Results  Component Value Date   ALT 22 07/31/2019   AST 18 07/31/2019   ALKPHOS 69 07/31/2019   BILITOT 0.4 07/31/2019   Lab Results  Component Value Date   HGBA1C 5.4 11/04/2018    HGBA1C 5.6 08/13/2018   Lab Results  Component Value Date   INSULIN 10.9 11/04/2018   INSULIN 13.2 08/13/2018   Lab Results  Component Value Date   TSH 1.09 06/04/2019   Lab Results  Component Value Date   CHOL 170 05/08/2018   HDL 43.40 05/08/2018   LDLCALC 103 (H) 05/08/2018   TRIG 114.0 05/08/2018   CHOLHDL 4 05/08/2018   Lab Results  Component Value Date   WBC 5.5 07/31/2019   HGB 12.0 07/31/2019   HCT 38.3 07/31/2019   MCV 90.8 07/31/2019   PLT 167 07/31/2019   Obesity Behavioral Intervention Documentation for Insurance:   Approximately 15 minutes were spent on the discussion below.  ASK: We discussed the diagnosis of obesity with [name] today and Izadora agreed to give [name] permission to discuss obesity behavioral modification therapy today.  ASSESS: Arieona has the diagnosis of obesity and her BMI today is 35.8. Tiffancy is in the action stage of change.   ADVISE: Ashanti was educated on the multiple health risks of obesity as well as the benefit of weight loss to improve her health. She was advised of the need for long term treatment and the importance of lifestyle modifications to improve her current health and to decrease her risk of future health problems.  AGREE: Multiple dietary modification options and treatment options were discussed and Eriko agreed to follow the recommendations documented in the above note.  ARRANGE: Danelly was educated on the importance of frequent visits to treat obesity as outlined per CMS and USPSTF guidelines and agreed to schedule her next follow up appointment today.  Attestation Statements:   Reviewed by clinician on day of visit: allergies, medications, problem list, medical history, surgical history, family history, social history, and previous encounter notes.  I, Water quality scientist, CMA, am acting as Location manager for [employer], DO  I have reviewed the above documentation for accuracy and completeness, and I agree with the  above. Jearld Lesch, DO

## 2019-11-27 ENCOUNTER — Ambulatory Visit (INDEPENDENT_AMBULATORY_CARE_PROVIDER_SITE_OTHER): Payer: Medicare Other

## 2019-11-27 ENCOUNTER — Ambulatory Visit (INDEPENDENT_AMBULATORY_CARE_PROVIDER_SITE_OTHER): Payer: Medicare Other | Admitting: Family Medicine

## 2019-11-27 ENCOUNTER — Encounter: Payer: Self-pay | Admitting: Family Medicine

## 2019-11-27 ENCOUNTER — Ambulatory Visit: Payer: Self-pay

## 2019-11-27 ENCOUNTER — Other Ambulatory Visit: Payer: Self-pay

## 2019-11-27 VITALS — BP 156/98 | HR 75 | Ht 62.0 in | Wt 197.6 lb

## 2019-11-27 DIAGNOSIS — M25561 Pain in right knee: Secondary | ICD-10-CM | POA: Diagnosis not present

## 2019-11-27 NOTE — Progress Notes (Signed)
    Subjective:    CC: R knee pain  I, Molly Weber, LAT, ATC, am serving as scribe for Dr. [name].  HPI: Pt is a 69 y/o female presenting w/ c/o R knee pain x one month.  She locates her pain to her R anterior knee just inferior to the patella.  She has a hx of a R TKA 7 years ago.  She has been doing a bit more squats and lunges in the gym with her personal trainer.  Radiating pain: No R knee swelling: Swelling and warmth at the R anterior knee. 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   Objective:    Vitals:   11/27/19 0901  BP: (!) 156/98  Pulse: 75  SpO2: 98%   General: Well Developed, well nourished, and in no acute distress.   MSK: Right knee mature scar anterior knee otherwise normal.  No significant swelling. Normal motion without crepitation. Mildly tender palpation overlying patellar tendon. Stable ligamentous exam. Intact strength without significant pain.  Lab and Radiology Results  X-ray images right knee obtained today personally and independently reviewed.   Intact prosthesis without fracture or obvious loosening. X-ray over read pending.  Diagnostic Limited MSK Ultrasound of: Right knee Quad tendon intact normal-appearing Patellar tendon is intact but thickened appearing without obvious tear. Impression: Patellar tendinitis   Impression and Recommendations:    Assessment and Plan: 69 y.o. female with right anterior knee pain.  Most likely patellar tendinitis based on ultrasound appearance and physical exam.  Plan for home exercise program patellar straps and diclofenac gel.Marland Kitchen  PDMP not reviewed this encounter. Orders Placed This Encounter  Procedures  . DG Knee AP/LAT W/[place]  Right    Standing Status:   Future    Number of Occurrences:   1    Standing Expiration Date:   12/27/2019    Order Specific Question:   Reason for Exam (SYMPTOM  OR DIAGNOSIS REQUIRED)    Answer:   R knee pain    Order Specific Question:   Preferred imaging location?    Answer:   [facility]  . [name] LIMITED JOINT SPACE STRUCTURES LOW RIGHT(NO LINKED CHARGES)    Order Specific Question:   Reason for Exam (SYMPTOM  OR DIAGNOSIS REQUIRED)    Answer:   R knee pain    Order Specific Question:   Preferred imaging location?    Answer:   [facility]   No orders of the defined types were placed in this encounter.   Discussed warning signs or symptoms. Please see discharge instructions. Patient expresses understanding.   The above documentation has been reviewed and is accurate and complete [name], M.D.

## 2019-11-27 NOTE — Patient Instructions (Signed)
Thank you for coming in today. Plan for home exercises.  Use voltaren gel up to 4x daily.   Do eccentric exercises.  Allow the leg to bend slowly resisting a bending force.  Or can do body weight squats to 70 degs (slowly going down) with your toes pointed down a little (heels on a small block)  If not improving in a few weeks let me know and I will arrange for PT.   Get xrays today.    Recheck in about 6 weeks especially if not better.    Patellar Tendinitis Rehab Ask your health care provider which exercises are safe for you. Do exercises exactly as told by your health care provider and adjust them as directed. It is normal to feel mild stretching, pulling, tightness, or discomfort as you do these exercises. Stop right away if you feel sudden pain or your pain gets worse. Do not begin these exercises until told by your health care provider. Stretching and range-of-motion exercise This exercise warms up your muscles and joints and improves the movement and flexibility of your knee. The exercise also helps to relieve pain and stiffness. Hamstring, doorway stretch This is an exercise in which you lie in a doorway and prop your leg on a wall to stretch the back of your knee and thigh (hamstring). 1. Lie on your back in front of a doorway with your left / right leg resting against the wall and your other leg flat on the floor in the doorway. There should be a slight bend in your left / right knee. 2. Straighten your left / right knee. You should feel a stretch behind your knee or thigh. If you do not, scoot your buttocks closer to the door. 3. Hold this position for __________ seconds. Repeat __________ times. Complete this exercise __________ times a day. Strengthening exercises These exercises build strength and endurance in your knee. Endurance is the ability to use your muscles for a long time, even after they get tired. Quadriceps, isometric This exercise stretches the muscles in front  of your thigh (quadriceps) without moving your knee joint (isometric). 1. Lie on your back with your left / right leg extended and your other knee bent. 2. Slowly tense the muscles in the front of your left / right thigh. When you do this, you should see your kneecap slide up toward your hip or see increased dimpling just above the knee. This motion will push the back of your knee toward the floor. If this is painful, try putting a rolled-up hand towel under your knee to support it in a bent position. Change the size of the towel to find a position that allows you to do this exercise without any pain. 3. For __________ seconds, hold the muscle as tight as you can without increasing your pain. 4. Relax the muscles slowly and completely. Repeat __________ times. Complete this exercise __________ times a day. Straight leg raises This exercise stretches the muscles in front of your thigh (quadriceps) and the muscles that move your hips (hip flexors). 1. Lie on your back with your left / right leg extended and your other knee bent. 2. Tense the muscles in the front of your left / right thigh. When you do this, you should see your kneecap slide up or see increased dimpling just above the knee. 3. Keep these muscles tight as you raise your leg 4-6 inches (10-15 cm) off the floor. Do not let your moving knee bend. 4. Hold this  position for __________ seconds. 5. Keep these muscles tense as you slowly lower your leg. 6. Relax your muscles slowly and completely. Repeat __________ times. Complete this exercise __________ times a day. Squats This is a weight-bearing exercise in which you bend your knees and lower your hips while engaging your thigh muscles. 1. Stand in front of a table, with your feet and knees pointing straight ahead. You may rest your hands on the table for balance but not for support. 2. Slowly bend your knees and lower your hips like you are going to sit in a chair. ? Keep your weight  over your heels, not over your toes. ? Keep your lower legs upright so they are parallel with the table legs. ? Do not let your hips go lower than your knees. ? Do not bend lower than told by your health care provider. ? If your knee pain increases, do not bend as low. 3. Hold the squat position for __________ seconds. 4. Slowly push with your legs to return to standing. Do not use your hands to pull yourself to standing. Repeat __________ times. Complete this exercise __________ times a day. Step-downs This is an exercise in which you step down slowly while engaging your leg muscles. 1. Stand on the edge of a step. 2. Keeping your weight over your __________ heel, slowly bend your __________ knee to bring your __________ heel toward the floor. Lower your heel as far as you can while keeping control and without increasing any discomfort. ? Do not let your __________ knee come forward. ? Use your leg muscles, not gravity, to lower your body. ? Hold a wall or rail for balance if needed. 3. Slowly push through your heel to lift your body weight back up. 4. Return to the starting position. Repeat __________ times. Complete this exercise __________ times a day. Straight leg raises This exercise strengthens the muscles that rotate the leg at the hip and move it away from your body (hip abductors). 1. Lie on your side with your left / right leg in the top position. Lie so your head, shoulder, knee, and hip line up. You may bend your lower knee to help you keep your balance. 2. Roll your hips slightly forward, so that your hips are stacked directly over each other and your left / right knee is facing forward. 3. Leading with your heel, lift your top leg 4-6 inches (10-15 cm). You should feel the muscles in your outer hip lifting. ? Do not let your foot drift forward. ? Do not let your knee roll toward the ceiling. 4. Hold this position for __________ seconds. 5. Slowly lower your leg to the  starting position. 6. Let your muscles relax completely after each repetition. Repeat __________ times. Complete this exercise __________ times a day. This information is not intended to replace advice given to you by your health care provider. Make sure you discuss any questions you have with your health care provider. Document Revised: 09/12/2018 Document Reviewed: 03/12/2018 Elsevier Patient Education  [place].

## 2019-12-01 ENCOUNTER — Telehealth: Payer: Self-pay | Admitting: Family Medicine

## 2019-12-01 DIAGNOSIS — M25561 Pain in right knee: Secondary | ICD-10-CM

## 2019-12-01 NOTE — Telephone Encounter (Signed)
Plan for three-phase bone scan.  Bone scan ordered.

## 2019-12-01 NOTE — Progress Notes (Signed)
X-ray knee shows some concern for loosening per radiology.  We will proceed with three-phase bone scan.

## 2019-12-01 NOTE — Telephone Encounter (Signed)
Called and relayed this info to pt's husband.  He verbalized understanding and will relay the message.

## 2019-12-02 ENCOUNTER — Encounter (INDEPENDENT_AMBULATORY_CARE_PROVIDER_SITE_OTHER): Payer: Self-pay | Admitting: Physician Assistant

## 2019-12-02 ENCOUNTER — Ambulatory Visit (INDEPENDENT_AMBULATORY_CARE_PROVIDER_SITE_OTHER): Payer: Medicare Other | Admitting: Physician Assistant

## 2019-12-02 ENCOUNTER — Other Ambulatory Visit: Payer: Self-pay

## 2019-12-02 VITALS — BP 117/76 | HR 82 | Temp 98.4°F | Ht 62.0 in | Wt 195.0 lb

## 2019-12-02 DIAGNOSIS — Z6835 Body mass index (BMI) 35.0-35.9, adult: Secondary | ICD-10-CM | POA: Diagnosis not present

## 2019-12-02 DIAGNOSIS — E038 Other specified hypothyroidism: Secondary | ICD-10-CM

## 2019-12-02 DIAGNOSIS — E8881 Metabolic syndrome: Secondary | ICD-10-CM | POA: Diagnosis not present

## 2019-12-03 NOTE — Progress Notes (Signed)
Chief Complaint:   OBESITY Vanessa Christensen is here to discuss her progress with her obesity treatment plan along with follow-up of her obesity related diagnoses. Vanessa Christensen is on the Category 4 Plan and states she is following her eating plan approximately 50% of the time. Vanessa Christensen states she is doing cardio/strengthening 45-60 minutes 4 times per week.  Today's visit was #: 59 Starting weight: 241 lbs Starting date: 08/13/2018 Today's weight: 195 lbs Today's date: 12/02/2019 Total lbs lost to date: 46 Total lbs lost since last in-office visit: 1  Interim History: Vanessa Christensen reports that her anxiety has been very high recently and for the past 3-4 days she has taken Klonopin twice daily. She is not getting enough protein daily.  Subjective:   Other specified hypothyroidism. Vanessa Christensen is on levothyroxine. Last TSH was within normal range.  Lab Results  Component Value Date   TSH 1.09 06/04/2019   Insulin resistance. Vanessa Christensen has a diagnosis of insulin resistance based on her elevated fasting insulin level >5. She continues to work on diet and exercise to decrease her risk of diabetes. Vanessa Christensen is on no medication and denies polyphagia. She is exercising regularly.  Lab Results  Component Value Date   INSULIN 10.9 11/04/2018   INSULIN 13.2 08/13/2018   Lab Results  Component Value Date   HGBA1C 5.4 11/04/2018   Assessment/Plan:   Other specified hypothyroidism. Patient with long-standing hypothyroidism, on levothyroxine therapy. She appears euthyroid. Orders and follow up as documented in patient record. Vanessa Christensen will continue her medication as directed. Will continue to monitor levels over time.  Counseling . Good thyroid control is important for overall health. Supratherapeutic thyroid levels are dangerous and will not improve weight loss results. . The correct way to take levothyroxine is fasting, with water, separated by at least 30 minutes from breakfast, and separated by more than 4  hours from calcium, iron, multivitamins, acid reflux medications (PPIs).   Insulin resistance. Vanessa Christensen will continue to work on weight loss, exercise, and decreasing simple carbohydrates to help decrease the risk of diabetes. Vanessa Christensen agreed to follow-up with [name] as directed to closely monitor her progress.  Class 2 severe obesity with serious comorbidity and body mass index (BMI) of 35.0 to 35.9 in adult, unspecified obesity type ([place]).  Vanessa Christensen is currently in the action stage of change. As such, her goal is to continue with weight loss efforts. She has agreed to the Category 4 Plan.   Exercise goals: Older adults should follow the adult guidelines. When older adults cannot meet the adult guidelines, they should be as physically active as their abilities and conditions will allow.   Behavioral modification strategies: increasing lean protein intake and no skipping meals.  Vanessa Christensen has agreed to follow-up with our clinic in 2 weeks. She was informed of the importance of frequent follow-up visits to maximize her success with intensive lifestyle modifications for her multiple health conditions.   Objective:   Blood pressure 117/76, pulse 82, temperature 98.4 F (36.9 C), temperature source Oral, height 5\' 2"  (1.575 m), weight 195 lb (88.5 kg), SpO2 99 %. Body mass index is 35.67 kg/m.  General: Cooperative, alert, well developed, in no acute distress. HEENT: Conjunctivae and lids unremarkable. Cardiovascular: Regular rhythm.  Lungs: Normal work of breathing. Neurologic: No focal deficits.   Lab Results  Component Value Date   CREATININE 0.75 07/31/2019   BUN 23 07/31/2019   NA 141 07/31/2019   K 4.6 07/31/2019   CL 107 07/31/2019   CO2  26 07/31/2019   Lab Results  Component Value Date   ALT 22 07/31/2019   AST 18 07/31/2019   ALKPHOS 69 07/31/2019   BILITOT 0.4 07/31/2019   Lab Results  Component Value Date   HGBA1C 5.4 11/04/2018   HGBA1C 5.6 08/13/2018   Lab Results    Component Value Date   INSULIN 10.9 11/04/2018   INSULIN 13.2 08/13/2018   Lab Results  Component Value Date   TSH 1.09 06/04/2019   Lab Results  Component Value Date   CHOL 170 05/08/2018   HDL 43.40 05/08/2018   LDLCALC 103 (H) 05/08/2018   TRIG 114.0 05/08/2018   CHOLHDL 4 05/08/2018   Lab Results  Component Value Date   WBC 5.5 07/31/2019   HGB 12.0 07/31/2019   HCT 38.3 07/31/2019   MCV 90.8 07/31/2019   PLT 167 07/31/2019   No results found for: IRON, TIBC, FERRITIN  Obesity Behavioral Intervention Documentation for Insurance:   Approximately 15 minutes were spent on the discussion below.  ASK: We discussed the diagnosis of obesity with Vanessa Christensen today and Vanessa Christensen agreed to give [name] permission to discuss obesity behavioral modification therapy today.  ASSESS: Vanessa Christensen has the diagnosis of obesity and her BMI today is 35.8. Vanessa Christensen is in the action stage of change.   ADVISE: Vanessa Christensen was educated on the multiple health risks of obesity as well as the benefit of weight loss to improve her health. She was advised of the need for long term treatment and the importance of lifestyle modifications to improve her current health and to decrease her risk of future health problems.  AGREE: Multiple dietary modification options and treatment options were discussed and Rand agreed to follow the recommendations documented in the above note.  ARRANGE: Vanessa Christensen was educated on the importance of frequent visits to treat obesity as outlined per CMS and USPSTF guidelines and agreed to schedule her next follow up appointment today.  Attestation Statements:   Reviewed by clinician on day of visit: allergies, medications, problem list, medical history, surgical history, family history, social history, and previous encounter notes.  Vanessa Christensen, am acting as transcriptionist for Vanessa Potash, PA-C   I have reviewed the above documentation for accuracy and completeness, and I agree with  the above. Vanessa Potash, PA-C

## 2019-12-12 ENCOUNTER — Other Ambulatory Visit: Payer: Self-pay

## 2019-12-12 ENCOUNTER — Encounter (HOSPITAL_COMMUNITY)
Admission: RE | Admit: 2019-12-12 | Discharge: 2019-12-12 | Disposition: A | Discharge status: Home / Self Care | Payer: Medicare Other | Source: Ambulatory Visit | Attending: Family Medicine | Admitting: Family Medicine

## 2019-12-12 ENCOUNTER — Ambulatory Visit (HOSPITAL_COMMUNITY)
Admission: RE | Admit: 2019-12-12 | Discharge: 2019-12-12 | Disposition: A | Discharge status: Home / Self Care | Payer: Medicare Other | Source: Ambulatory Visit | Attending: Family Medicine | Admitting: Family Medicine

## 2019-12-12 DIAGNOSIS — M25561 Pain in right knee: Secondary | ICD-10-CM | POA: Diagnosis present

## 2019-12-12 MED ORDER — TECHNETIUM TC 99M MEDRONATE IV KIT
20.0000 | PACK | Freq: Once | INTRAVENOUS | Status: AC | PRN
  Administered 2019-12-12: 20 via INTRAVENOUS

## 2019-12-16 NOTE — Progress Notes (Signed)
Three-phase bone scan shows loosening.  Bone infection unlikely based on the appearance.

## 2019-12-23 ENCOUNTER — Ambulatory Visit (INDEPENDENT_AMBULATORY_CARE_PROVIDER_SITE_OTHER): Payer: Medicare Other | Admitting: Bariatrics

## 2019-12-23 ENCOUNTER — Other Ambulatory Visit: Payer: Self-pay

## 2019-12-23 ENCOUNTER — Encounter (INDEPENDENT_AMBULATORY_CARE_PROVIDER_SITE_OTHER): Payer: Self-pay | Admitting: Bariatrics

## 2019-12-23 VITALS — BP 132/80 | HR 84 | Temp 98.5°F | Ht 62.0 in | Wt 199.0 lb

## 2019-12-23 DIAGNOSIS — M25561 Pain in right knee: Secondary | ICD-10-CM | POA: Diagnosis not present

## 2019-12-23 DIAGNOSIS — Z6836 Body mass index (BMI) 36.0-36.9, adult: Secondary | ICD-10-CM | POA: Diagnosis not present

## 2019-12-23 DIAGNOSIS — E038 Other specified hypothyroidism: Secondary | ICD-10-CM | POA: Diagnosis not present

## 2019-12-23 DIAGNOSIS — E8881 Metabolic syndrome: Secondary | ICD-10-CM | POA: Diagnosis not present

## 2019-12-24 ENCOUNTER — Encounter (INDEPENDENT_AMBULATORY_CARE_PROVIDER_SITE_OTHER): Payer: Self-pay | Admitting: Bariatrics

## 2019-12-24 NOTE — Progress Notes (Signed)
Chief Complaint:   OBESITY Vanessa Christensen is here to discuss her progress with her obesity treatment plan along with follow-up of her obesity related diagnoses. Vanessa Christensen is on the Category 4 Plan and states she is following her eating plan approximately 80% of the time. Vanessa Christensen states she is doing cardio/strengthening 45-60 minutes 3 times per week.  Today's visit was #: 20 Starting weight: 241 lbs Starting date: 08/13/2018 Today's weight: 199 lbs Today's date: 12/23/2019 Total lbs lost to date: 42 Total lbs lost since last in-office visit: 0  Interim History: Vanessa Christensen is up 4 lbs, but had "a party" and has had more involvement with friends. The bioimpedance scale showed she was up in water weight.  Subjective:   Insulin resistance. Vanessa Christensen has a diagnosis of insulin resistance based on her elevated fasting insulin level >5. She continues to work on diet and exercise to decrease her risk of diabetes. Vanessa Christensen is on no medication and denies polyphagia.  Lab Results  Component Value Date   INSULIN 10.9 11/04/2018   INSULIN 13.2 08/13/2018   Lab Results  Component Value Date   HGBA1C 5.4 11/04/2018   Other specified hypothyroidism. Vanessa Christensen is taking Synthroid.   Lab Results  Component Value Date   TSH 1.09 06/04/2019   Right knee pain, unspecified chronicity. Vanessa Christensen had a scan which showed tendonitis.   Assessment/Plan:   Insulin resistance. Vanessa Christensen will continue to work on weight loss, exercise, and decreasing simple carbohydrates to help decrease the risk of diabetes. Vanessa Christensen agreed to follow-up with [name] as directed to closely monitor her progress.  Other specified hypothyroidism. Patient with long-standing hypothyroidism, on levothyroxine therapy. She appears euthyroid. Orders and follow up as documented in patient record. Vanessa Christensen will continue Synthroid as directed.   Counseling . Good thyroid control is important for overall health. Vanessa Christensen thyroid levels are dangerous  and will not improve weight loss results. . The correct way to take levothyroxine is fasting, with water, separated by at least 30 minutes from breakfast, and separated by more than 4 hours from calcium, iron, multivitamins, acid reflux medications (PPIs).   Right knee pain, unspecified chronicity. Vanessa Christensen will use the brace as her provider suggested and will use Tylenol or Mobic as needed.  Class 2 severe obesity with serious comorbidity and body mass index (BMI) of 36.0 to 36.9 in adult, unspecified obesity type (Vanessa Christensen).  Vanessa Christensen is currently in the action stage of change. As such, her goal is to continue with weight loss efforts. She has agreed to the Category 4 Plan.   She will work on meal planning, intentional eating, and continuing to drink adequate waster.   Exercise goals: Older adults should follow the adult guidelines. When older adults cannot meet the adult guidelines, they should be as physically active as their abilities and conditions will allow.   Behavioral modification strategies: increasing lean protein intake, decreasing simple carbohydrates, increasing vegetables, increasing water intake, decreasing eating out, no skipping meals, meal planning and cooking strategies, keeping healthy foods in the home and planning for success.  Vanessa Christensen has agreed to follow-up with our clinic fasting in 2-3 weeks. She was informed of the importance of frequent follow-up visits to maximize her success with intensive lifestyle modifications for her multiple health conditions.   Objective:   Blood pressure 132/80, pulse 84, temperature 98.5 F (36.9 C), height 5\' 2"  (1.575 m), weight 199 lb (90.3 kg), SpO2 98 %. Body mass index is 36.4 kg/m.  General: Cooperative, alert, well developed, in  no acute distress. HEENT: Conjunctivae and lids unremarkable. Cardiovascular: Regular rhythm.  Lungs: Normal work of breathing. Neurologic: No focal deficits.   Lab Results  Component Value Date    CREATININE 0.75 07/31/2019   BUN 23 07/31/2019   NA 141 07/31/2019   K 4.6 07/31/2019   CL 107 07/31/2019   CO2 26 07/31/2019   Lab Results  Component Value Date   ALT 22 07/31/2019   AST 18 07/31/2019   ALKPHOS 69 07/31/2019   BILITOT 0.4 07/31/2019   Lab Results  Component Value Date   HGBA1C 5.4 11/04/2018   HGBA1C 5.6 08/13/2018   Lab Results  Component Value Date   INSULIN 10.9 11/04/2018   INSULIN 13.2 08/13/2018   Lab Results  Component Value Date   TSH 1.09 06/04/2019   Lab Results  Component Value Date   CHOL 170 05/08/2018   HDL 43.40 05/08/2018   LDLCALC 103 (H) 05/08/2018   TRIG 114.0 05/08/2018   CHOLHDL 4 05/08/2018   Lab Results  Component Value Date   WBC 5.5 07/31/2019   HGB 12.0 07/31/2019   HCT 38.3 07/31/2019   MCV 90.8 07/31/2019   PLT 167 07/31/2019   No results found for: IRON, TIBC, FERRITIN  Obesity Behavioral Intervention Documentation for Insurance:   Approximately 15 minutes were spent on the discussion below.  ASK: We discussed the diagnosis of obesity with Vanessa Christensen today and Vanessa Christensen agreed to give [name] permission to discuss obesity behavioral modification therapy today.  ASSESS: Vanessa Christensen has the diagnosis of obesity and her BMI today is 36.4. Vanessa Christensen is in the action stage of change.   ADVISE: Vanessa Christensen was educated on the multiple health risks of obesity as well as the benefit of weight loss to improve her health. She was advised of the need for long term treatment and the importance of lifestyle modifications to improve her current health and to decrease her risk of future health problems.  AGREE: Multiple dietary modification options and treatment options were discussed and Vanessa Christensen agreed to follow the recommendations documented in the above note.  ARRANGE: Vanessa Christensen was educated on the importance of frequent visits to treat obesity as outlined per CMS and USPSTF guidelines and agreed to schedule her next follow up appointment  today.  Attestation Statements:   Reviewed by clinician on day of visit: allergies, medications, problem list, medical history, surgical history, family history, social history, and previous encounter notes.  Vanessa Christensen, am acting as Location manager for [employer], DO   I have reviewed the above documentation for accuracy and completeness, and I agree with the above. Jearld Lesch, DO

## 2019-12-25 ENCOUNTER — Other Ambulatory Visit: Payer: Self-pay

## 2019-12-25 ENCOUNTER — Ambulatory Visit: Payer: Medicare Other

## 2019-12-25 ENCOUNTER — Telehealth: Payer: Self-pay

## 2019-12-25 DIAGNOSIS — F32A Depression, unspecified: Secondary | ICD-10-CM

## 2019-12-25 DIAGNOSIS — N3281 Overactive bladder: Secondary | ICD-10-CM

## 2019-12-25 NOTE — Patient Instructions (Addendum)
Please call me at [phone] (direct line) with any questions - thank you!  - Edyth Gunnels., Clinical Pharmacist  Goals Addressed            This Visit's Progress   . Anxiety/Depression: Minimize recurring symptoms       CARE PLAN ENTRY Current Barriers:  . Chronic Disease Management support, education, and care coordination needs related to anxiety/depression. o Fluoxetine 40 mg once daily o Clonazepam 0.5 mg once daily   Pharmacist Clinical Goal(s):  Marland Kitchen Over next 90 days: minimize recurring anxiety/depression symptoms.  Interventions: . Comprehensive medication review performed.  Patient Self Care Activities:  . Patient verbalizes understanding of plan to continue medication as prescribed over next 90 days. . Continue current medication and follow up as directed for counseling and/or medication changes.  . Please contact office with any worsening symptoms. Initial goal documentation    . Overactive Bladder: Minimize recurring symptoms       CARE PLAN ENTRY Current Barriers:  . Chronic Disease Management support, education, and care coordination needs related to overactive bladder o Currently taking tolterodine ER 4 mg daily.  Pharmacist Clinical Goal(s):  Marland Kitchen Over next 90 days: Minimize recurring symptoms of overactive bladder  Interventions: . Comprehensive medication review performed. . 1 week telephone f/u with pharmacist to further discuss over active bladder and review potential financial concerns  Patient Self Care Activities:  Marland Kitchen Maintain log for overactive bladder . Patient verbalizes understanding of plan to take medication as prescribed over next 90 days unless directed otherwise. Please call with any questions!  Initial goal documentation.      Ms. Brassell was given information about Chronic Care Management services today including:  1. CCM service includes personalized support from designated clinical staff supervised by her physician, including individualized  plan of care and coordination with other care providers 2. 24/7 contact phone numbers for assistance for urgent and routine care needs. 3. Standard insurance, coinsurance, copays and deductibles apply for chronic care management only during months in which we provide at least 20 minutes of these services. Most insurances cover these services at 100%, however patients may be responsible for any copay, coinsurance and/or deductible if applicable. This service may help you avoid the need for more expensive face-to-face services. 4. Only one practitioner may furnish and bill the service in a calendar month. 5. The patient may stop CCM services at any time (effective at the end of the month) by phone call to the office staff.  Patient agreed to services and verbal consent obtained.   The patient verbalized understanding of instructions provided today and agreed to receive a mailed copy of patient instruction and/or educational materials. Telephone follow up appointment with pharmacy team member scheduled for: See next appointment with "Care Management Staff" under "What's Next" below.   Thank you!  Madelin Rear, Pharm.D., BCGP Clinical Pharmacist [facility] [phone]  Urinary Incontinence  Urinary incontinence refers to a condition in which a person is unable to control where and when to pass urine. A person with this condition will urinate when he or she does not mean to (involuntarily). What are the causes? This condition may be caused by:  Medicines.  Infections.  Constipation.  Overactive bladder muscles.  Weak bladder muscles.  Weak pelvic floor muscles. These muscles provide support for the bladder, intestine, and, in women, the uterus.  Enlarged prostate in men. The prostate is a gland near the bladder. When it gets too big, it can  pinch the urethra. With the urethra blocked, the bladder can weaken and lose the ability to empty  properly.  Surgery.  Emotional factors, such as anxiety, stress, or post-traumatic stress disorder (PTSD).  Pelvic organ prolapse. This happens in women when organs shift out of place and into the vagina. This shift can prevent the bladder and urethra from working properly. What increases the risk? The following factors may make you more likely to develop this condition:  Older age.  Obesity and physical inactivity.  Pregnancy and childbirth.  Menopause.  Diseases that affect the nerves or spinal cord (neurological diseases).  Long-term (chronic) coughing. This can increase pressure on the bladder and pelvic floor muscles. What are the signs or symptoms? Symptoms may vary depending on the type of urinary incontinence you have. They include:  A sudden urge to urinate, but passing urine involuntarily before you can get to a bathroom (urge incontinence).  Suddenly passing urine with any activity that forces urine to pass, such as coughing, laughing, exercise, or sneezing (stress incontinence).  Needing to urinate often, but urinating only a small amount, or constantly dribbling urine (overflow incontinence).  Urinating because you cannot get to the bathroom in time due to a physical disability, such as arthritis or injury, or communication and thinking problems, such as Alzheimer disease (functional incontinence). How is this diagnosed? This condition may be diagnosed based on:  Your medical history.  A physical exam.  Tests, such as: ? Urine tests. ? X-rays of your kidney and bladder. ? Ultrasound. ? CT scan. ? Cystoscopy. In this procedure, a health care provider inserts a tube with a light and camera (cystoscope) through the urethra and into the bladder in order to check for problems. ? Urodynamic testing. These tests assess how well the bladder, urethra, and sphincter can store and release urine. There are different types of urodynamic tests, and they vary depending on  what the test is measuring. To help diagnose your condition, your health care provider may recommend that you keep a log of when you urinate and how much you urinate. How is this treated? Treatment for this condition depends on the type of incontinence that you have and its cause. Treatment may include:  Lifestyle changes, such as: ? Quitting smoking. ? Maintaining a healthy weight. ? Staying active. Try to get 150 minutes of moderate-intensity exercise every week. Ask your health care provider which activities are safe for you. ? Eating a healthy diet.  Avoid high-fat foods, like fried foods.  Avoid refined carbohydrates like white bread and white rice.  Limit how much alcohol and caffeine you drink.  Increase your fiber intake. Foods such as fresh fruits, vegetables, beans, and whole grains are healthy sources of fiber.  Pelvic floor muscle exercises.  Bladder training, such as lengthening the amount of time between bathroom breaks, or using the bathroom at regular intervals.  Using techniques to suppress bladder urges. This can include distraction techniques or controlled breathing exercises.  Medicines to relax the bladder muscles and prevent bladder spasms.  Medicines to help slow or prevent the growth of a man's prostate.  Botox injections. These can help relax the bladder muscles.  Using pulses of electricity to help change bladder reflexes (electrical nerve stimulation).  For women, using a medical device to prevent urine leaks. This is a small, tampon-like, disposable device that is inserted into the urethra.  Injecting collagen or carbon beads (bulking agents) into the urinary sphincter. These can help thicken tissue and close the  bladder opening.  Surgery. Follow these instructions at home: Lifestyle  Limit alcohol and caffeine. These can fill your bladder quickly and irritate it.  Keep yourself clean to help prevent odors and skin damage. Ask your doctor about  special skin creams and cleansers that can protect the skin from urine.  Consider wearing pads or adult diapers. Make sure to change them regularly, and always change them right after experiencing incontinence. General instructions  Take over-the-counter and prescription medicines only as told by your health care provider.  Use the bathroom about every 3-4 hours, even if you do not feel the need to urinate. Try to empty your bladder completely every time. After urinating, wait a minute. Then try to urinate again.  Make sure you are in a relaxed position while urinating.  If your incontinence is caused by nerve problems, keep a log of the medicines you take and the times you go to the bathroom.  Keep all follow-up visits as told by your health care provider. This is important. Contact a health care provider if:  You have pain that gets worse.  Your incontinence gets worse. Get help right away if:  You have a fever or chills.  You are unable to urinate.  You have redness in your groin area or down your legs. Summary  Urinary incontinence refers to a condition in which a person is unable to control where and when to pass urine.  This condition may be caused by medicines, infection, weak bladder muscles, weak pelvic floor muscles, enlargement of the prostate (in men), or surgery.  The following factors increase your risk for developing this condition: older age, obesity, pregnancy and childbirth, menopause, neurological diseases, and chronic coughing.  There are several types of urinary incontinence. They include urge incontinence, stress incontinence, overflow incontinence, and functional incontinence.  This condition is usually treated first with lifestyle and behavioral changes, such as quitting smoking, eating a healthier diet, and doing regular pelvic floor exercises. Other treatment options include medicines, bulking agents, medical devices, electrical nerve stimulation, or  surgery. This information is not intended to replace advice given to you by your health care provider. Make sure you discuss any questions you have with your health care provider. Document Revised: 06/01/2017 Document Reviewed: 08/31/2016 Elsevier Patient Education  [place].

## 2019-12-25 NOTE — Progress Notes (Signed)
Chronic Care Management Pharmacy  Name: Vanessa Christensen  MRN: [id number] DOB: 21-Jan-1951  Chief Complaint/ HPI  Vanessa Christensen,  69 y.o. , female presents for their Follow-Up CCM visit with the clinical pharmacist via telephone due to COVID-19 Pandemic.  PCP : [name], MD  Urinary urgency present multiple times throughout the day on most days. States using four times last night. Feels OAB has not improved but is motivated to start keeping a log performing exercises previously prescribed to her.   Recently switched to from lexapro to fluoxetine and reports benefit of improved mood with current dose.   Previously exercising daily for at least 30 minutes, is now at 3x/wk for at least 30 minutes.   Their chronic conditions include: Hypothyroidism, Anxiety/Depression, Overactive bladder, Vitamin D Deficiency, Obesity, Pain.   Outpatient Encounter Medications as of 12/25/2019  Medication Sig Note  . acetaminophen (TYLENOL) 325 MG tablet Take 650 mg by mouth every 6 (six) hours as needed.   Marland Kitchen Apoaequorin (PREVAGEN PO) Take by mouth.   . Ascorbic Acid (VITAMIN C ER PO) Take 1 tablet by mouth daily.    Marland Kitchen aspirin 81 MG tablet Take 81 mg by mouth daily.   . AZOPT 1 % ophthalmic suspension PLACE 1 DROP INTO BOTH EYES TWICE DAILY 12/02/2015: Received from: External Pharmacy  . b complex vitamins tablet Take 1 tablet by mouth daily.   . clonazePAM (KLONOPIN) 0.5 MG tablet Take 0.5 mg by mouth 2 (two) times daily as needed for anxiety.   Marland Kitchen FLUoxetine (PROZAC) 20 MG capsule TAKE 1 CAPSULE BY MOUTH EVERY DAY (Patient taking differently: Take 40 mg by mouth daily. )   . levothyroxine (SYNTHROID) 75 MCG tablet TAKE 1 TABLET BY MOUTH EVERY DAY   . meclizine (ANTIVERT) 25 MG tablet Take 25 mg by mouth 3 (three) times daily as needed for dizziness.   . meloxicam (MOBIC) 15 MG tablet TAKE 1 TABLET BY MOUTH EVERY DAY (Patient taking differently: PRN)   . Multiple Vitamins-Minerals (MULTIVITAMIN  ADULTS 50+ PO) Take 1 tablet by mouth daily.    . psyllium (METAMUCIL) 58.6 % powder Take 1 packet by mouth daily.    Marland Kitchen tolterodine (DETROL LA) 4 MG 24 hr capsule TAKE 1 CAPSULE BY MOUTH EVERY DAY   . TURMERIC PO Take by mouth.   Marland Kitchen VITAMIN D, CHOLECALCIFEROL, PO Take 25 mcg by mouth daily.     No facility-administered encounter medications on file as of 12/25/2019.   Current Diagnosis/Assessment:  Goals Addressed            This Visit's Progress   . Anxiety/Depression: Minimize recurring symptoms       CARE PLAN ENTRY Current Barriers:  . Chronic Disease Management support, education, and care coordination needs related to anxiety/depression. o Fluoxetine 40 mg once daily o Clonazepam 0.5 mg once daily   Pharmacist Clinical Goal(s):  Marland Kitchen Over next 90 days: minimize recurring anxiety/depression symptoms.  Interventions: . Comprehensive medication review performed.  Patient Self Care Activities:  . Patient verbalizes understanding of plan to continue medication as prescribed over next 90 days. . Continue current medication and follow up as directed for counseling and/or medication changes.  . Please contact office with any worsening symptoms. Initial goal documentation    . Overactive Bladder: Minimize recurring symptoms       CARE PLAN ENTRY Current Barriers:  . Chronic Disease Management support, education, and care coordination needs related to overactive bladder o Currently taking tolterodine ER 4 mg  daily.  Pharmacist Clinical Goal(s):  Marland Kitchen Over next 90 days: Minimize recurring symptoms of overactive bladder  Interventions: . Comprehensive medication review performed. . 1 week telephone f/u with pharmacist to further discuss over active bladder and review potential financial concerns  Patient Self Care Activities:  . Patient verbalizes understanding of plan to take medication as prescribed over next 90 days unless directed otherwise. Please call with any  questions!  Initial goal documentation.      Anxiety/Depression   PHQ2/9: 1. Previous 15+ yr history of use with escitalopram. Now taking fluoxetine. Patient is currently controlled on the following medications:  . Fluoxetine 40 mg daily . Clonazepam 0.5 mg twice daily as needed for anxiety   Plan  Continue current management.  Overactive Bladder   Patient has failed these meds in past: Vesicare. Currently taking tolterodine ER 4mg  daily. States medication has help but urinary symptoms still present. Reports leaking at night most nights, routinely uses pad. We discussed: OAB triggers, maintaining log of event. Agrees to maintain log and perform exercises.  Plan  Continue current medications. Phone call follow-up next week as patient wants to review symptoms through log and coordinate care as appropriate. Cost review of potential alternatives.   Medication Management   [facility].   [id number] [place],  - [name] [place]  [address] Phone: [phone] Fax: [phone]  Plan: f/u call 1 week to see if patient wanting to consider alternative/additional OAB agent. Cost review if appropriate.   Madelin Rear, Pharm.D., BCGP Clinical Pharmacist [facility] [phone]

## 2020-01-01 NOTE — Progress Notes (Signed)
Pt wanting to stay on current therapy for OAB, has reduced intake of artificial sugar and caffeine and notes improvement and overall has been more mindful of triggers. At this point she will follow-up with the office if symptoms become worse.   Also mentions ongoing knee pain and feels she spends most of her time sitting. She previously was seen by Dr. [name] w/ weight management and now will f/u with his office to help improve her exercise/pain.

## 2020-01-05 ENCOUNTER — Other Ambulatory Visit: Payer: Self-pay | Admitting: Family Medicine

## 2020-01-08 ENCOUNTER — Ambulatory Visit: Payer: Medicare Other | Admitting: Family Medicine

## 2020-01-09 ENCOUNTER — Other Ambulatory Visit: Payer: Self-pay

## 2020-01-09 ENCOUNTER — Encounter: Payer: Self-pay | Admitting: Family Medicine

## 2020-01-09 ENCOUNTER — Ambulatory Visit: Payer: Self-pay

## 2020-01-09 ENCOUNTER — Ambulatory Visit (INDEPENDENT_AMBULATORY_CARE_PROVIDER_SITE_OTHER): Payer: Medicare Other | Admitting: Family Medicine

## 2020-01-09 VITALS — BP 140/84 | HR 73 | Ht 62.0 in | Wt 197.2 lb

## 2020-01-09 DIAGNOSIS — M7651 Patellar tendinitis, right knee: Secondary | ICD-10-CM | POA: Diagnosis not present

## 2020-01-09 DIAGNOSIS — M25561 Pain in right knee: Secondary | ICD-10-CM

## 2020-01-09 DIAGNOSIS — M25562 Pain in left knee: Secondary | ICD-10-CM

## 2020-01-09 NOTE — Patient Instructions (Signed)
Thank you for coming in today.  Plan for PT and surgical option about your knee.   Let me know what they say.  I can get a second opinion as well if needed.   Call or go to the ER if you develop a large red swollen joint with extreme pain or oozing puss.   Use a cane if needed.

## 2020-01-09 NOTE — Progress Notes (Signed)
I, Vanessa Christensen, LAT, ATC, am serving as scribe for Dr. [name].  Vanessa Christensen is a 69 y.o. female who presents to [place] at [facility] today for f/u of R knee pain.  She was last seen by Dr. [name] on 11/27/19 and was shown a HEP by Dr. [name] and advised to use Voltaren gel.  She has a hx of a R TKA approximately 7 years ago.  Since her last visit, pt reports that her R knee pain is worsening.  She states that it is worse when getting up in the middle of the night to go to the bathroom.  She states that she is limping which is now affecting her L knee because she's favoring the R knee.  She has been using the Voltaren gel.   She also notes left anterior medial knee pain.  This has been worsening as her right knee has also been worsening a bit  Diagnostic testing: R knee XR- 11/27/19    Pertinent review of systems: No fevers or chills  Relevant historical information: Right TKR   Exam:  BP 140/84 (BP Location: Right Arm, Patient Position: Sitting, Cuff Size: Normal)   Pulse 73   Ht 5\' 2"  (1.575 m)   Wt 197 lb 3.2 oz (89.4 kg)   SpO2 96%   BMI 36.07 kg/m  General: Well Developed, well nourished, and in no acute distress.   MSK:  Right knee: Mature scar anterior knee Normal motion. Tender palpation anterior medial knee tibial plateau. Stable ligamentous exam.  Left knee normal-appearing tender palpation anterior medial knee.   Stable ligamentous exam    Lab and Radiology Results NM Bone Scan 3 Phase Lower Extremity  Result Date: 12/12/2019 CLINICAL DATA:  Pain. Right knee replacement. Left knee pain for months. EXAM: NUCLEAR MEDICINE 3-PHASE BONE SCAN TECHNIQUE: Radionuclide angiographic images, immediate static blood pool images, and 3-hour delayed static images were obtained of the knees after intravenous injection of radiopharmaceutical. RADIOPHARMACEUTICALS:  20.3 mCi Tc-82m MDP IV COMPARISON:  None. FINDINGS: Vascular phase: Normal Blood pool  phase: Mild increased uptake around the right knee replacement. The uptake is most prominent adjacent to the femoral component in the medial aspect of the tibial component. Mild increased uptake in the medial compartment of the left knee. Delayed phase: Focal uptake adjacent to the medial aspect of the tibial component on the right. Remainder of uptake around the right knee replacement is within normal limits. Focal uptake in the medial left knee is likely degenerative. IMPRESSION: 1. The focal uptake on delayed imaging adjacent to the medial aspect of the right tibial component is almost certainly associated with the lucency seen on x-ray imaging. Osteomyelitis or loosening are not excluded given the x-ray findings and the focal uptake in this region on today's imaging. However, osteomyelitis would typically demonstrate focal blood pool and vascular phase uptake which is not seen on this study. 2. Degenerative changes in the medial compartment of the left knee. Electronically Signed   By: Dorise Bullion III M.D   On: 12/12/2019 19:45   [name] LIMITED JOINT SPACE STRUCTURES LOW RIGHT(NO LINKED CHARGES)  Result Date: 12/02/2019  Diagnostic Limited MSK Ultrasound of: Right knee Quad tendon intact normal-appearing Patellar tendon is intact but thickened appearing without obvious tear. Impression: Patellar tendinitis   DG Knee AP/LAT W/[place] Right  Result Date: 11/28/2019 CLINICAL DATA:  Right knee pain for 1 month EXAM: RIGHT KNEE 3 VIEWS COMPARISON:  None. FINDINGS: Status post right total knee  arthroplasty. Arthroplasty components are in their expected alignment without periprosthetic fracture. There is subtle periprosthetic lucency within the peripheral aspect of the medial tibial plateau. Small knee joint effusion versus postoperative synovial thickening. Soft tissues within normal limits. IMPRESSION: 1. Subtle periprosthetic lucency within the peripheral aspect of the medial tibial plateau. Findings  concerning for loosening. 2. No evidence of periprosthetic fracture. 3. Small knee joint effusion versus postoperative synovial thickening. Electronically Signed   By: Davina Poke D.O.   On: 11/28/2019 11:52   I, [name], personally (independently) visualized and performed the interpretation of the images attached in this note.   Diagnostic Limited MSK Ultrasound of: Left knee Bony prominence at anterior medial proximal tibia tender to palpation with ultrasound probe. Impression: Bone spur soft tissue impingement.  Procedure: Real-time Ultrasound Guided Injection of left knee area of tenderness soft tissue impingement bone spur tibial plateau Device: Philips Affiniti 50G Images permanently stored and available for review in the ultrasound unit. Verbal informed consent obtained.  Discussed risks and benefits of procedure. Warned about infection bleeding damage to structures skin hypopigmentation and fat atrophy among others. Patient expresses understanding and agreement Time-out conducted.   Noted no overlying erythema, induration, or other signs of local infection.   Skin prepped in a sterile fashion.   Local anesthesia: Topical Ethyl chloride.   With sterile technique and under real time ultrasound guidance:  40 mg of Kenalog and 1 mL of Marcaine injected easily.   Completed without difficulty   Pain immediately resolved suggesting accurate placement of the medication.   Advised to call if fevers/chills, erythema, induration, drainage, or persistent bleeding.   Images permanently stored and available for review in the ultrasound unit.  Impression: Technically successful ultrasound guided injection.        Assessment and Plan: 69 y.o. female with right knee pain.  Presumably due to hardware loosening.  Patient has lucency on x-ray and subsequent abnormal three-phase bone scan.  Osteomyelitis very unlikely.  There is a possibility that she does have some patellar tendinitis is  contributing to her pain.  She failed to improve with conservative management.  Plan to refer to physical therapy and simultaneously refer to orthopedic surgery for surgical consultation.  Discussed that she may want a second opinion as well if needed.  Happy to refer that as well.  Left knee pain.  Patient has bone spur at anterior medial proximal tibia impinging on soft tissue.  This was injected with steroids today.  Hopefully this will provide some better pain relief.  Recheck back as needed in the near future.    Orders Placed This Encounter  Procedures  . [name] LIMITED JOINT SPACE STRUCTURES LOW RIGHT(NO LINKED CHARGES)    Order Specific Question:   Reason for Exam (SYMPTOM  OR DIAGNOSIS REQUIRED)    Answer:   R knee pain    Order Specific Question:   Preferred imaging location?    Answer:   [facility]  . Ambulatory referral to Physical Therapy    Referral Priority:   Routine    Referral Type:   Physical Medicine    Referral Reason:   Specialty Services Required    Requested Specialty:   Physical Therapy    Number of Visits Requested:   1  . Ambulatory referral to Orthopedic Surgery    Referral Priority:   Routine    Referral Type:   Surgical    Referral Reason:   Specialty Services Required    Requested Specialty:  Orthopedic Surgery    Number of Visits Requested:   1   No orders of the defined types were placed in this encounter.    Discussed warning signs or symptoms. Please see discharge instructions. Patient expresses understanding.   The above documentation has been reviewed and is accurate and complete [name], M.D.

## 2020-01-15 ENCOUNTER — Ambulatory Visit (INDEPENDENT_AMBULATORY_CARE_PROVIDER_SITE_OTHER): Payer: Medicare Other | Admitting: Bariatrics

## 2020-01-15 ENCOUNTER — Encounter (INDEPENDENT_AMBULATORY_CARE_PROVIDER_SITE_OTHER): Payer: Self-pay | Admitting: Bariatrics

## 2020-01-15 ENCOUNTER — Other Ambulatory Visit: Payer: Self-pay

## 2020-01-15 VITALS — BP 143/86 | HR 76 | Temp 98.4°F | Ht 62.0 in | Wt 195.0 lb

## 2020-01-15 DIAGNOSIS — E559 Vitamin D deficiency, unspecified: Secondary | ICD-10-CM | POA: Diagnosis not present

## 2020-01-15 DIAGNOSIS — Z6835 Body mass index (BMI) 35.0-35.9, adult: Secondary | ICD-10-CM

## 2020-01-15 DIAGNOSIS — R7309 Other abnormal glucose: Secondary | ICD-10-CM | POA: Diagnosis not present

## 2020-01-15 DIAGNOSIS — E038 Other specified hypothyroidism: Secondary | ICD-10-CM

## 2020-01-16 LAB — COMPREHENSIVE METABOLIC PANEL
ALT: 19 IU/L (ref 0–32)
AST: 18 IU/L (ref 0–40)
Albumin/Globulin Ratio: 2 (ref 1.2–2.2)
Albumin: 4.5 g/dL (ref 3.7–4.7)
Alkaline Phosphatase: 71 IU/L (ref 48–121)
BUN/Creatinine Ratio: 31 — ABNORMAL HIGH (ref 12–28)
BUN: 22 mg/dL (ref 8–27)
Bilirubin Total: 0.3 mg/dL (ref 0.0–1.2)
CO2: 23 mmol/L (ref 20–29)
Calcium: 9.7 mg/dL (ref 8.7–10.3)
Chloride: 105 mmol/L (ref 96–106)
Creatinine, Ser: 0.7 mg/dL (ref 0.57–1.00)
GFR calc Af Amer: 101 mL/min/{1.73_m2} (ref >59)
GFR calc non Af Amer: 87 mL/min/{1.73_m2} (ref >59)
Globulin, Total: 2.2 g/dL (ref 1.5–4.5)
Glucose: 81 mg/dL (ref 65–99)
Potassium: 4.5 mmol/L (ref 3.5–5.2)
Sodium: 141 mmol/L (ref 134–144)
Total Protein: 6.7 g/dL (ref 6.0–8.5)

## 2020-01-16 LAB — LIPID PANEL WITH LDL/HDL RATIO
Cholesterol, Total: 188 mg/dL (ref 100–199)
HDL: 52 mg/dL (ref >39)
LDL Chol Calc (NIH): 124 mg/dL — ABNORMAL HIGH (ref 0–99)
LDL/HDL Ratio: 2.4 ratio (ref 0.0–3.2)
Triglycerides: 63 mg/dL (ref 0–149)
VLDL Cholesterol Cal: 12 mg/dL (ref 5–40)

## 2020-01-16 LAB — T3: T3, Total: 100 ng/dL (ref 71–180)

## 2020-01-16 LAB — HEMOGLOBIN A1C
Est. average glucose Bld gHb Est-mCnc: 114 mg/dL
Hgb A1c MFr Bld: 5.6 % (ref 4.8–5.6)

## 2020-01-16 LAB — VITAMIN D 25 HYDROXY (VIT D DEFICIENCY, FRACTURES): Vit D, 25-Hydroxy: 36.9 ng/mL (ref 30.0–100.0)

## 2020-01-16 LAB — INSULIN, RANDOM: INSULIN: 8 u[IU]/mL (ref 2.6–24.9)

## 2020-01-16 LAB — T4, FREE: Free T4: 1.33 ng/dL (ref 0.82–1.77)

## 2020-01-16 LAB — TSH: TSH: 1.67 u[IU]/mL (ref 0.450–4.500)

## 2020-01-20 ENCOUNTER — Encounter (INDEPENDENT_AMBULATORY_CARE_PROVIDER_SITE_OTHER): Payer: Self-pay | Admitting: Bariatrics

## 2020-01-20 ENCOUNTER — Encounter: Payer: Self-pay | Admitting: Family Medicine

## 2020-01-20 NOTE — Progress Notes (Signed)
Chief Complaint:   OBESITY Vanessa Christensen is here to discuss her progress with her obesity treatment plan along with follow-up of her obesity related diagnoses. Vanessa Christensen is on the Category 4 Plan and states she is following her eating plan approximately 50% of the time. Man states she is exercising 0 minutes 0 times per week.  Today's visit was #: 54 Starting weight: 241 lbs Starting date: 08/13/2018 Today's weight: 195 lbs Today's date: 01/15/2020 Total lbs lost to date: 46 Total lbs lost since last in-office visit: 4  Interim History: Vanessa Christensen is down an additional 4 lbs since her last visit. She is having right knee pain and has an appointment with an orthopedic surgeon (increased pain at night and cannot sleep).  Subjective:   Vitamin D deficiency. No nausea, vomiting, or muscle weakness.   Elevated glucose. Vanessa Christensen is on no medication.  Other specified hypothyroidism. Vanessa Christensen is taking Synthroid.  Lab Results  Component Value Date   TSH 1.670 01/15/2020   Assessment/Plan:   Vitamin D deficiency. Low Vitamin D level contributes to fatigue and are associated with obesity, breast, and colon cancer. VITAMIN D 25 Hydroxy (Vit-D Deficiency, Fractures) level will be checked today.  Elevated glucose. Comprehensive metabolic panel, Hemoglobin A1c, Insulin, random labs will be checked today.   Other specified hypothyroidism. Patient with long-standing hypothyroidism, on levothyroxine therapy. She appears euthyroid. Orders and follow up as documented in patient record. T3, T4, free, TSH levels will be checked today.  Counseling . Good thyroid control is important for overall health. Supratherapeutic thyroid levels are dangerous and will not improve weight loss results. . The correct way to take levothyroxine is fasting, with water, separated by at least 30 minutes from breakfast, and separated by more than 4 hours from calcium, iron, multivitamins, acid reflux medications (PPIs).    Class 2 severe obesity with serious comorbidity and body mass index (BMI) of 35.0 to 35.9 in adult, unspecified obesity type (Vanessa Christensen).  Vanessa Christensen is currently in the action stage of change. As such, her goal is to continue with weight loss efforts. She has agreed to the Category 4 Plan.   She will work on meal planning, intentional eating, and increasing her water intake.   Exercise goals: Vanessa Christensen has not been able to go to the gym; will do upper arm exercises.  Behavioral modification strategies: increasing lean protein intake, decreasing simple carbohydrates, increasing vegetables, increasing water intake, decreasing eating out, no skipping meals, meal planning and cooking strategies, keeping healthy foods in the home and planning for success.  Vanessa Christensen has agreed to follow-up with our clinic in 2-3 weeks. She was informed of the importance of frequent follow-up visits to maximize her success with intensive lifestyle modifications for her multiple health conditions.   Objective:   Blood pressure (!) 143/86, pulse 76, temperature 98.4 F (36.9 C), height 5\' 2"  (1.575 m), weight 195 lb (88.5 kg), SpO2 98 %. Body mass index is 35.67 kg/m.  General: Cooperative, alert, well developed, in no acute distress. HEENT: Conjunctivae and lids unremarkable. Cardiovascular: Regular rhythm.  Lungs: Normal work of breathing. Neurologic: No focal deficits.   Lab Results  Component Value Date   CREATININE 0.70 01/15/2020   BUN 22 01/15/2020   NA 141 01/15/2020   K 4.5 01/15/2020   CL 105 01/15/2020   CO2 23 01/15/2020   Lab Results  Component Value Date   ALT 19 01/15/2020   AST 18 01/15/2020   ALKPHOS 71 01/15/2020   BILITOT 0.3  01/15/2020   Lab Results  Component Value Date   HGBA1C 5.6 01/15/2020   HGBA1C 5.4 11/04/2018   HGBA1C 5.6 08/13/2018   Lab Results  Component Value Date   INSULIN 8.0 01/15/2020   INSULIN 10.9 11/04/2018   INSULIN 13.2 08/13/2018   Lab Results  Component  Value Date   TSH 1.670 01/15/2020   Lab Results  Component Value Date   CHOL 188 01/15/2020   HDL 52 01/15/2020   LDLCALC 124 (H) 01/15/2020   TRIG 63 01/15/2020   CHOLHDL 4 05/08/2018   Lab Results  Component Value Date   WBC 5.5 07/31/2019   HGB 12.0 07/31/2019   HCT 38.3 07/31/2019   MCV 90.8 07/31/2019   PLT 167 07/31/2019   No results found for: IRON, TIBC, FERRITIN  Obesity Behavioral Intervention Documentation for Insurance:   Approximately 15 minutes were spent on the discussion below.  ASK: We discussed the diagnosis of obesity with Vanessa Christensen today and Vanessa Christensen agreed to give [name] permission to discuss obesity behavioral modification therapy today.  ASSESS: Vanessa Christensen has the diagnosis of obesity and her BMI today is 35.7. Vanessa Christensen is in the action stage of change.   ADVISE: Vanessa Christensen was educated on the multiple health risks of obesity as well as the benefit of weight loss to improve her health. She was advised of the need for long term treatment and the importance of lifestyle modifications to improve her current health and to decrease her risk of future health problems.  AGREE: Multiple dietary modification options and treatment options were discussed and Vanessa Christensen agreed to follow the recommendations documented in the above note.  ARRANGE: Vanessa Christensen was educated on the importance of frequent visits to treat obesity as outlined per CMS and USPSTF guidelines and agreed to schedule her next follow up appointment today.  Attestation Statements:   Reviewed by clinician on day of visit: allergies, medications, problem list, medical history, surgical history, family history, social history, and previous encounter notes.  [name], am acting as Location manager for [employer], DO   I have reviewed the above documentation for accuracy and completeness, and I agree with the above. Jearld Lesch, DO

## 2020-01-22 MED ORDER — TRAMADOL HCL 50 MG PO TABS: 50.0000 mg | ORAL_TABLET | Freq: Three times a day (TID) | ORAL | 0 refills | Status: DC | PRN

## 2020-01-28 ENCOUNTER — Encounter: Payer: Self-pay | Admitting: Orthopaedic Surgery

## 2020-01-28 ENCOUNTER — Ambulatory Visit (INDEPENDENT_AMBULATORY_CARE_PROVIDER_SITE_OTHER): Payer: Medicare Other | Admitting: Orthopaedic Surgery

## 2020-01-28 DIAGNOSIS — T84012D Broken internal right knee prosthesis, subsequent encounter: Secondary | ICD-10-CM | POA: Diagnosis not present

## 2020-01-28 DIAGNOSIS — Z96651 Presence of right artificial knee joint: Secondary | ICD-10-CM | POA: Diagnosis not present

## 2020-01-28 NOTE — Progress Notes (Signed)
Office Visit Note   Patient: Vanessa Christensen           Date of Birth: 02-Dec-1950           MRN: [id number] Visit Date: 01/28/2020              Requested by: Gregor Hams, MD [place],  [place] [address] PCP: [name], MD   Assessment & Plan: Visit Diagnoses:  1. History of total right knee replacement   2. Failed total knee, right, subsequent encounter     Plan: I went over her x-rays and studies with her in detail. This does appear to be aseptic loosening of her tibial component. We are recommending though a full revision arthroplasty based on the worsening with the implants as well. I had a long thorough discussion with her about the surgery. I described in detail using a knee model what the surgery involves including a thorough assessment of the risk and benefits of surgery and what interoperative and postoperative course were involved. This is something that cannot be done as an outpatient. She understands that with the COVID-19 pandemic we will delay this at least for the next month but she says she is not in a rush but things are slowly worsening so we do need to do this in the next several months. All question concerns were answered and addressed. We will be in touch.  Follow-Up Instructions: Return for 2 weeks post-op.   Orders:  No orders of the defined types were placed in this encounter.  No orders of the defined types were placed in this encounter.     Procedures: No procedures performed   Clinical Data: No additional findings.   Subjective: Chief Complaint  Patient presents with  . 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 She has been seen by Dr. [name] who help diagnose this. She does have a history of a right total knee arthro[place]. She has done well  for a long period time but then developed pain over many months now along the medial mall lateral aspect of her knee at the tibial plateau side on the right side. She is not a diabetic. She exercises regularly. She is now having problems with weightbearing and standing and having a lot of pain along the knee in general. She said no other acute changes in her medical status and is as active as possible and certainly younger appearing for age.  HPI  Review of Systems She currently denies any headache, chest pain, shortness of breath, fever, chills, nausea, vomiting  Objective: Vital Signs: There were no vitals taken for this visit.  Physical Exam Examination of her right knee shows a mild effusion. Her incision is well-healed there is no redness. She range of motion is full and the knee is ligamentously stable. Ortho Exam  Specialty Comments:  No specialty comments available.  Imaging: No results found. X-rays independently reviewed of the right knee that were recent as well as a bone scan shows evidence of loosening of the tibial tray. Its elevatus on x-ray that we can see this as well. There is now some slight varus malalignment. There is no evidence of infection.  PMFS History: Patient Active Problem List   Diagnosis Date Noted  . History of total  right knee replacement 01/28/2020  . Vitamin D deficiency 08/29/2018  . 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 Class 3 severe obesity with serious comorbidity and body mass index (BMI) of 40.0 to 44.9 in adult ([place]) 08/14/2018  . 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 Incontinence of urine in female 11/07/2017  . Vertigo 11/07/2017  . Left knee pain 10/25/2016  . Genetic testing 08/08/2016  . Hypothyroidism 12/02/2015  . Anxiety and depression 12/02/2015  . OAB (overactive bladder) 12/02/2015  . Breast cancer of upper-outer quadrant of left female breast ([place]) 12/02/2015  . Obesity (BMI 30-39.9) 12/02/2015   Past Medical History:    Diagnosis Date  . Anemia    hx of  . Anxiety    pt denies  . Arthritis    hands and feet  . Cancer ([place]) 10/04/2006   breast  . Cataracts, both eyes   . Depression   . Depression   . Diverticulosis of colon   . Glaucoma   . Heart murmur   . Joint pain   . Knee pain   . Obesity   . OSA on CPAP   . Osteoarthritis   . Sleep apnea    no CPAP- no longer needed d/t weight loss  . Thyroid activity decreased     Family History  Problem Relation Age of Onset  . CVA Mother   . Cancer Mother 76       breast cancer   . Heart disease Mother   . Stroke Mother   . Obesity Mother   . Leukemia Father   . High blood pressure Father   . High Cholesterol Father   . Heart disease Father   . Cancer Maternal Aunt 55       breast cancer   . Cancer Maternal Aunt 55       breast cancer  . Colon cancer Maternal Aunt   . Cancer Cousin 32       breast cancer  . Esophageal cancer Neg Hx   . Rectal cancer Neg Hx   . Stomach cancer Neg Hx     Past Surgical History:  Procedure Laterality Date  . BREAST SURGERY Bilateral   . CATARACT EXTRACTION    . COLONOSCOPY    . GLAUCOMA REPAIR    . MASTECTOMY, RADICAL Bilateral   . neulasta induced sterile abscesses    . THYROIDECTOMY, PARTIAL    . TOTAL KNEE ARTHROPLASTY Right    Social History   Occupational History  . Occupation: Retired    Comment: Pharmacist, hospital  Tobacco Use  . Smoking status: Former Smoker    Quit date: 11/03/1978    Years since quitting: 41.2  . Smokeless tobacco: Never Used  Vaping Use  . Vaping Use: Never used  Substance and Sexual Activity  . Alcohol use: No  . Drug use: No  . Sexual activity: Not Currently

## 2020-01-29 DIAGNOSIS — Z961 Presence of intraocular lens: Secondary | ICD-10-CM | POA: Diagnosis not present

## 2020-01-29 DIAGNOSIS — H401131 Primary open-angle glaucoma, bilateral, mild stage: Secondary | ICD-10-CM | POA: Diagnosis not present

## 2020-02-01 ENCOUNTER — Other Ambulatory Visit: Payer: Self-pay | Admitting: Family Medicine

## 2020-02-04 ENCOUNTER — Other Ambulatory Visit: Payer: Self-pay

## 2020-02-04 ENCOUNTER — Ambulatory Visit (INDEPENDENT_AMBULATORY_CARE_PROVIDER_SITE_OTHER): Payer: Medicare Other | Admitting: Bariatrics

## 2020-02-04 ENCOUNTER — Encounter (INDEPENDENT_AMBULATORY_CARE_PROVIDER_SITE_OTHER): Payer: Self-pay | Admitting: Bariatrics

## 2020-02-04 VITALS — BP 122/76 | HR 86 | Temp 98.5°F | Ht 62.0 in | Wt 196.0 lb

## 2020-02-04 DIAGNOSIS — E8881 Metabolic syndrome: Secondary | ICD-10-CM

## 2020-02-04 DIAGNOSIS — E559 Vitamin D deficiency, unspecified: Secondary | ICD-10-CM | POA: Diagnosis not present

## 2020-02-04 DIAGNOSIS — Z6836 Body mass index (BMI) 36.0-36.9, adult: Secondary | ICD-10-CM

## 2020-02-05 ENCOUNTER — Encounter (INDEPENDENT_AMBULATORY_CARE_PROVIDER_SITE_OTHER): Payer: Self-pay | Admitting: Bariatrics

## 2020-02-05 MED ORDER — VITAMIN D (ERGOCALCIFEROL) 1.25 MG (50000 UNIT) PO CAPS: 50000.0000 [IU] | ORAL_CAPSULE | ORAL | 0 refills | Status: DC

## 2020-02-05 NOTE — Progress Notes (Signed)
Chief Complaint:   OBESITY Vanessa Christensen is here to discuss her progress with her obesity treatment plan along with follow-up of her obesity related diagnoses. Vanessa Christensen is on the Category 4 Plan and states she is following her eating plan approximately 50% of the time. Vanessa Christensen states she is exercising 0 minutes 0 times per week.  Today's visit was #: 31 Starting weight: 241 lbs Starting date: 08/13/2018 Today's weight: 196 lbs Today's date: 02/04/2020 Total lbs lost to date: 45 Total lbs lost since last in-office visit: 0  Interim History: [name] is up 1 lb, but she has been on vacation and has had right knee pain. She visited Orthopedics on 01/28/2020.  Subjective:   Insulin resistance. Vanessa Christensen has a diagnosis of insulin resistance based on her elevated fasting insulin level >5. She continues to work on diet and exercise to decrease her risk of diabetes. Insulin level is improved - previously was 13.2; now is 8.0. A1c 5.6.  Lab Results  Component Value Date   INSULIN 8.0 01/15/2020   INSULIN 10.9 11/04/2018   INSULIN 13.2 08/13/2018   Lab Results  Component Value Date   HGBA1C 5.6 01/15/2020   Vitamin D deficiency. Vanessa Christensen is not taking Vitamin D supplementation.    Ref. Range 01/15/2020 12:54  Vitamin D, 25-Hydroxy Latest Ref Range: 30.0 - 100.0 ng/mL 36.9   Assessment/Plan:   Insulin resistance. Vanessa Christensen will continue to work on weight loss, exercise, increasing healthy fats and protein, and decreasing simple carbohydrates to help decrease the risk of diabetes. Vanessa Christensen agreed to follow-up with [name] as directed to closely monitor her progress.  Vitamin D deficiency. Low Vitamin D level contributes to fatigue and are associated with obesity, breast, and colon cancer. She was given a prescription for Vitamin D, Ergocalciferol, (DRISDOL) 1.25 MG (50000 UNIT) CAPS capsule every week #4 with 0 refills and will follow-up for routine testing of Vitamin D, at least 2-3 times per year to  avoid over-replacement.   Class 2 severe obesity with serious comorbidity and body mass index (BMI) of 36.0 to 36.9 in adult, unspecified obesity type ([place]).  Carlen is currently in the action stage of change. As such, her goal is to continue with weight loss efforts. She has agreed to the Category 4 Plan.   She will work on meal planning, decrease carbohydrates, and increase protein.  We independently reviewed with the patient labs including CMP, lipids, Vitamin D, A1c, insulin, and thyroid panel.  Exercise goals: Vanessa Christensen will start chair exercises.  Behavioral modification strategies: increasing lean protein intake, decreasing simple carbohydrates, increasing vegetables, increasing water intake, decreasing eating out, no skipping meals, meal planning and cooking strategies, keeping healthy foods in the home and planning for success.  Vanessa Christensen has agreed to follow-up with our clinic in 3 weeks. She was informed of the importance of frequent follow-up visits to maximize her success with intensive lifestyle modifications for her multiple health conditions.   Objective:   Blood pressure 122/76, pulse 86, temperature 98.5 F (36.9 C), height 5\' 2"  (1.575 m), weight 196 lb (88.9 kg), SpO2 97 %. Body mass index is 35.85 kg/m.  General: Cooperative, alert, well developed, in no acute distress. HEENT: Conjunctivae and lids unremarkable. Cardiovascular: Regular rhythm.  Lungs: Normal work of breathing. Neurologic: No focal deficits.   Lab Results  Component Value Date   CREATININE 0.70 01/15/2020   BUN 22 01/15/2020   NA 141 01/15/2020   K 4.5 01/15/2020   CL 105 01/15/2020  CO2 23 01/15/2020   Lab Results  Component Value Date   ALT 19 01/15/2020   AST 18 01/15/2020   ALKPHOS 71 01/15/2020   BILITOT 0.3 01/15/2020   Lab Results  Component Value Date   HGBA1C 5.6 01/15/2020   HGBA1C 5.4 11/04/2018   HGBA1C 5.6 08/13/2018   Lab Results  Component Value Date   INSULIN 8.0  01/15/2020   INSULIN 10.9 11/04/2018   INSULIN 13.2 08/13/2018   Lab Results  Component Value Date   TSH 1.670 01/15/2020   Lab Results  Component Value Date   CHOL 188 01/15/2020   HDL 52 01/15/2020   LDLCALC 124 (H) 01/15/2020   TRIG 63 01/15/2020   CHOLHDL 4 05/08/2018   Lab Results  Component Value Date   WBC 5.5 07/31/2019   HGB 12.0 07/31/2019   HCT 38.3 07/31/2019   MCV 90.8 07/31/2019   PLT 167 07/31/2019   No results found for: IRON, TIBC, FERRITIN  Obesity Behavioral Intervention Documentation for Insurance:   Approximately 15 minutes were spent on the discussion below.  ASK: We discussed the diagnosis of obesity with Vanessa Christensen today and Vanessa Christensen agreed to give [name] permission to discuss obesity behavioral modification therapy today.  ASSESS: Vanessa Christensen has the diagnosis of obesity and her BMI today is 36.0. Vanessa Christensen is in the action stage of change.   ADVISE: Vanessa Christensen was educated on the multiple health risks of obesity as well as the benefit of weight loss to improve her health. She was advised of the need for long term treatment and the importance of lifestyle modifications to improve her current health and to decrease her risk of future health problems.  AGREE: Multiple dietary modification options and treatment options were discussed and Vanessa Christensen agreed to follow the recommendations documented in the above note.  ARRANGE: Vanessa Christensen was educated on the importance of frequent visits to treat obesity as outlined per CMS and USPSTF guidelines and agreed to schedule her next follow up appointment today.  Attestation Statements:   Reviewed by clinician on day of visit: allergies, medications, problem list, medical history, surgical history, family history, social history, and previous encounter notes.  Vanessa Christensen, am acting as Location manager for [employer], DO   I have reviewed the above documentation for accuracy and completeness, and I agree with the above. Vanessa Lesch, DO

## 2020-02-11 DIAGNOSIS — M25561 Pain in right knee: Secondary | ICD-10-CM | POA: Diagnosis not present

## 2020-02-11 DIAGNOSIS — M7651 Patellar tendinitis, right knee: Secondary | ICD-10-CM | POA: Diagnosis not present

## 2020-02-16 DIAGNOSIS — M25561 Pain in right knee: Secondary | ICD-10-CM | POA: Diagnosis not present

## 2020-02-16 DIAGNOSIS — M7651 Patellar tendinitis, right knee: Secondary | ICD-10-CM | POA: Diagnosis not present

## 2020-02-24 ENCOUNTER — Ambulatory Visit (INDEPENDENT_AMBULATORY_CARE_PROVIDER_SITE_OTHER): Payer: Medicare Other | Admitting: Bariatrics

## 2020-02-24 ENCOUNTER — Other Ambulatory Visit: Payer: Self-pay

## 2020-02-24 ENCOUNTER — Encounter (INDEPENDENT_AMBULATORY_CARE_PROVIDER_SITE_OTHER): Payer: Self-pay | Admitting: Bariatrics

## 2020-02-24 VITALS — BP 140/67 | HR 79 | Temp 98.3°F | Ht 62.0 in | Wt 197.0 lb

## 2020-02-24 DIAGNOSIS — E559 Vitamin D deficiency, unspecified: Secondary | ICD-10-CM | POA: Diagnosis not present

## 2020-02-24 DIAGNOSIS — Z6836 Body mass index (BMI) 36.0-36.9, adult: Secondary | ICD-10-CM | POA: Diagnosis not present

## 2020-02-24 DIAGNOSIS — E038 Other specified hypothyroidism: Secondary | ICD-10-CM

## 2020-02-24 DIAGNOSIS — E66812 Obesity, class 2: Secondary | ICD-10-CM

## 2020-02-24 MED ORDER — VITAMIN D (ERGOCALCIFEROL) 1.25 MG (50000 UNIT) PO CAPS: 50000.0000 [IU] | ORAL_CAPSULE | ORAL | 0 refills | Status: DC

## 2020-02-26 ENCOUNTER — Encounter: Payer: Self-pay | Admitting: Family Medicine

## 2020-02-26 ENCOUNTER — Encounter (INDEPENDENT_AMBULATORY_CARE_PROVIDER_SITE_OTHER): Payer: Self-pay | Admitting: Bariatrics

## 2020-02-26 NOTE — Progress Notes (Signed)
Chief Complaint:   OBESITY Vanessa Christensen is here to discuss her progress with her obesity treatment plan along with follow-up of her obesity related diagnoses. Vanessa Christensen is on the Category 4 Plan and states she is following her eating plan approximately 30-35% of the time. Vanessa Christensen states she is going to the gym 60 minutes 3 times per week.  Today's visit was #: 6 Starting weight: 241 lbs Starting date: 08/13/2018 Today's weight: 197 lbs Today's date: 02/24/2020 Total lbs lost to date: 44 Total lbs lost since last in-office visit: 0  Interim History: Vanessa Christensen is up 1 lb, but has done well overall. She is having quite a bit of right knee pain and has started PT.  Subjective:   Vitamin D deficiency. No nausea, vomiting, or muscle weakness.    Ref. Range 01/15/2020 12:54  Vitamin D, 25-Hydroxy Latest Ref Range: 30.0 - 100.0 ng/mL 36.9   Other specified hypothyroidism. Vanessa Christensen is taking Synthroid.  Lab Results  Component Value Date   TSH 1.670 01/15/2020   Assessment/Plan:   Vitamin D deficiency. Low Vitamin D level contributes to fatigue and are associated with obesity, breast, and colon cancer. She was given a prescription for Vitamin D, Ergocalciferol, (DRISDOL) 1.25 MG (50000 UNIT) CAPS capsule every week #4 with 0 refills and will follow-up for routine testing of Vitamin D, at least 2-3 times per year to avoid over-replacement.   Other specified hypothyroidism. Patient with long-standing hypothyroidism, on levothyroxine therapy. She appears euthyroid. Orders and follow up as documented in patient record. [name] will continue Synthroid as directed.   Counseling . Good thyroid control is important for overall health. Supratherapeutic thyroid levels are dangerous and will not improve weight loss results. . The correct way to take levothyroxine is fasting, with water, separated by at least 30 minutes from breakfast, and separated by more than 4 hours from calcium, iron, multivitamins,  acid reflux medications (PPIs).   Class 2 severe obesity with serious comorbidity and body mass index (BMI) of 36.0 to 36.9 in adult, unspecified obesity type ([place]).  [name] is currently in the action stage of change. As such, her goal is to continue with weight loss efforts. She has agreed to the Category 4 Plan.   She will work on meal planning and intentional eating.   Exercise goals: Vanessa Christensen will continue PT and using her stationary bike for exercise.  Behavioral modification strategies: increasing lean protein intake, decreasing simple carbohydrates, increasing vegetables and increasing water intake.  Vanessa Christensen has agreed to follow-up with our clinic in 3 weeks. She was informed of the importance of frequent follow-up visits to maximize her success with intensive lifestyle modifications for her multiple health conditions.   Objective:   Blood pressure 140/67, pulse 79, temperature 98.3 F (36.8 C), height 5\' 2"  (1.575 m), SpO2 96 %. Body mass index is 35.85 kg/m.  General: Cooperative, alert, well developed, in no acute distress. HEENT: Conjunctivae and lids unremarkable. Cardiovascular: Regular rhythm.  Lungs: Normal work of breathing. Neurologic: No focal deficits. Pt is using a cane for ambulation.  Lab Results  Component Value Date   CREATININE 0.70 01/15/2020   BUN 22 01/15/2020   NA 141 01/15/2020   K 4.5 01/15/2020   CL 105 01/15/2020   CO2 23 01/15/2020   Lab Results  Component Value Date   ALT 19 01/15/2020   AST 18 01/15/2020   ALKPHOS 71 01/15/2020   BILITOT 0.3 01/15/2020   Lab Results  Component Value Date  HGBA1C 5.6 01/15/2020   HGBA1C 5.4 11/04/2018   HGBA1C 5.6 08/13/2018   Lab Results  Component Value Date   INSULIN 8.0 01/15/2020   INSULIN 10.9 11/04/2018   INSULIN 13.2 08/13/2018   Lab Results  Component Value Date   TSH 1.670 01/15/2020   Lab Results  Component Value Date   CHOL 188 01/15/2020   HDL 52 01/15/2020   LDLCALC 124 (H)  01/15/2020   TRIG 63 01/15/2020   CHOLHDL 4 05/08/2018   Lab Results  Component Value Date   WBC 5.5 07/31/2019   HGB 12.0 07/31/2019   HCT 38.3 07/31/2019   MCV 90.8 07/31/2019   PLT 167 07/31/2019   No results found for: IRON, TIBC, FERRITIN  Obesity Behavioral Intervention:   Approximately 15 minutes were spent on the discussion below.  ASK: We discussed the diagnosis of obesity with Vanessa Christensen today and Vanessa Christensen agreed to give [name] permission to discuss obesity behavioral modification therapy today.  ASSESS: Vanessa Christensen has the diagnosis of obesity and her BMI today is 36.2. [name] is in the action stage of change.   ADVISE: Sharelle was educated on the multiple health risks of obesity as well as the benefit of weight loss to improve her health. She was advised of the need for long term treatment and the importance of lifestyle modifications to improve her current health and to decrease her risk of future health problems.  AGREE: Multiple dietary modification options and treatment options were discussed and Vanessa Christensen agreed to follow the recommendations documented in the above note.  ARRANGE: Vanessa Christensen was educated on the importance of frequent visits to treat obesity as outlined per CMS and USPSTF guidelines and agreed to schedule her next follow up appointment today.  Attestation Statements:   Reviewed by clinician on day of visit: allergies, medications, problem list, medical history, surgical history, family history, social history, and previous encounter notes.  Vanessa Christensen, am acting as Location manager for [employer], DO   I have reviewed the above documentation for accuracy and completeness, and I agree with the above. Vanessa Lesch, DO

## 2020-03-02 DIAGNOSIS — M25561 Pain in right knee: Secondary | ICD-10-CM | POA: Diagnosis not present

## 2020-03-02 DIAGNOSIS — M7651 Patellar tendinitis, right knee: Secondary | ICD-10-CM | POA: Diagnosis not present

## 2020-03-04 DIAGNOSIS — M25561 Pain in right knee: Secondary | ICD-10-CM | POA: Diagnosis not present

## 2020-03-04 DIAGNOSIS — M7651 Patellar tendinitis, right knee: Secondary | ICD-10-CM | POA: Diagnosis not present

## 2020-03-08 DIAGNOSIS — M7651 Patellar tendinitis, right knee: Secondary | ICD-10-CM | POA: Diagnosis not present

## 2020-03-08 DIAGNOSIS — M25561 Pain in right knee: Secondary | ICD-10-CM | POA: Diagnosis not present

## 2020-03-11 DIAGNOSIS — M25561 Pain in right knee: Secondary | ICD-10-CM | POA: Diagnosis not present

## 2020-03-11 DIAGNOSIS — M7651 Patellar tendinitis, right knee: Secondary | ICD-10-CM | POA: Diagnosis not present

## 2020-03-23 ENCOUNTER — Other Ambulatory Visit (INDEPENDENT_AMBULATORY_CARE_PROVIDER_SITE_OTHER): Payer: Self-pay | Admitting: Bariatrics

## 2020-03-23 ENCOUNTER — Other Ambulatory Visit: Payer: Self-pay

## 2020-03-23 ENCOUNTER — Ambulatory Visit (INDEPENDENT_AMBULATORY_CARE_PROVIDER_SITE_OTHER): Payer: Medicare Other | Admitting: Bariatrics

## 2020-03-23 ENCOUNTER — Encounter (INDEPENDENT_AMBULATORY_CARE_PROVIDER_SITE_OTHER): Payer: Self-pay | Admitting: Bariatrics

## 2020-03-23 VITALS — BP 150/81 | HR 80 | Temp 98.2°F | Ht 62.0 in | Wt 200.0 lb

## 2020-03-23 DIAGNOSIS — E559 Vitamin D deficiency, unspecified: Secondary | ICD-10-CM

## 2020-03-23 DIAGNOSIS — M7651 Patellar tendinitis, right knee: Secondary | ICD-10-CM | POA: Diagnosis not present

## 2020-03-23 DIAGNOSIS — E038 Other specified hypothyroidism: Secondary | ICD-10-CM

## 2020-03-23 DIAGNOSIS — Z6836 Body mass index (BMI) 36.0-36.9, adult: Secondary | ICD-10-CM | POA: Diagnosis not present

## 2020-03-23 DIAGNOSIS — M25561 Pain in right knee: Secondary | ICD-10-CM | POA: Diagnosis not present

## 2020-03-23 MED ORDER — LEVOTHYROXINE SODIUM 75 MCG PO TABS: 75.0000 ug | ORAL_TABLET | Freq: Every day | ORAL | 0 refills | Status: DC

## 2020-03-23 MED ORDER — VITAMIN D (ERGOCALCIFEROL) 1.25 MG (50000 UNIT) PO CAPS: 50000.0000 [IU] | ORAL_CAPSULE | ORAL | 0 refills | Status: DC

## 2020-03-24 ENCOUNTER — Encounter (INDEPENDENT_AMBULATORY_CARE_PROVIDER_SITE_OTHER): Payer: Self-pay | Admitting: Bariatrics

## 2020-03-24 NOTE — Progress Notes (Signed)
Chief Complaint:   OBESITY Vanessa Christensen is here to discuss her progress with her obesity treatment plan along with follow-up of her obesity related diagnoses. Suzy is on the Category 4 Plan and states she is following her eating plan approximately 25-30% of the time. Rosaland states she is doing upper body exercises 45 minutes 1-2 times per week.  Today's visit was #: 60 Starting weight: 241 lbs Starting date: 08/13/2018 Today's weight: 200 lbs Today's date: 03/23/2020 Total lbs lost to date: 41 Total lbs lost since last in-office visit: 0  Interim History: Chirstina is up 3 lbs. She is having pain in her right knee and sees PT today. She has called her Psychologist, sport and exercise. She reports not getting in enough water.  Subjective:   Vitamin D deficiency. No nausea, vomiting, or muscle weakness.    Ref. Range 01/15/2020 12:54  Vitamin D, 25-Hydroxy Latest Ref Range: 30.0 - 100.0 ng/mL 36.9   Other specified hypothyroidism. Symptoms are controlled.   Lab Results  Component Value Date   TSH 1.670 01/15/2020   Assessment/Plan:   Vitamin D deficiency. Low Vitamin D level contributes to fatigue and are associated with obesity, breast, and colon cancer. She was given a prescription for Vitamin D, Ergocalciferol, (DRISDOL) 1.25 MG (50000 UNIT) CAPS capsule every week #4 with 0 refills and will follow-up for routine testing of Vitamin D, at least 2-3 times per year to avoid over-replacement.   Other specified hypothyroidism. Patient with long-standing hypothyroidism, on levothyroxine therapy. She appears euthyroid. Orders and follow up as documented in patient record. Prescription was given for levothyroxine (SYNTHROID) 75 MCG tablet 1 PO daily #30 with 0 refills.  Counseling . Good thyroid control is important for overall health. Supratherapeutic thyroid levels are dangerous and will not improve weight loss results. . The correct way to take levothyroxine is fasting, with water, separated by at  least 30 minutes from breakfast, and separated by more than 4 hours from calcium, iron, multivitamins, acid reflux medications (PPIs).   Class 2 severe obesity with serious comorbidity and body mass index (BMI) of 36.0 to 36.9 in adult, unspecified obesity type ([place]).  Janell is currently in the action stage of change. As such, her goal is to continue with weight loss efforts. She has agreed to the Category 4 Plan.    She will work on meal planning, intentional eating, increasing her water intake, and making better choices.  Exercise goals: Zameria will continue to do upper body exercises at this time.  Behavioral modification strategies: increasing lean protein intake, decreasing simple carbohydrates, increasing vegetables, increasing water intake, decreasing eating out, no skipping meals, meal planning and cooking strategies, keeping healthy foods in the home and planning for success.  Lanyla has agreed to follow-up with our clinic in 2-3 weeks. She was informed of the importance of frequent follow-up visits to maximize her success with intensive lifestyle modifications for her multiple health conditions.   Objective:   Blood pressure (!) 150/81, pulse 80, temperature 98.2 F (36.8 C), height 5\' 2"  (1.575 m), weight 200 lb (90.7 kg), SpO2 98 %. Body mass index is 36.58 kg/m.  General: Cooperative, alert, well developed, in no acute distress. HEENT: Conjunctivae and lids unremarkable. Cardiovascular: Regular rhythm.  Lungs: Normal work of breathing. Neurologic: No focal deficits. Pt is using a cane for ambulation.  Lab Results  Component Value Date   CREATININE 0.70 01/15/2020   BUN 22 01/15/2020   NA 141 01/15/2020   K 4.5 01/15/2020  CL 105 01/15/2020   CO2 23 01/15/2020   Lab Results  Component Value Date   ALT 19 01/15/2020   AST 18 01/15/2020   ALKPHOS 71 01/15/2020   BILITOT 0.3 01/15/2020   Lab Results  Component Value Date   HGBA1C 5.6 01/15/2020   HGBA1C 5.4  11/04/2018   HGBA1C 5.6 08/13/2018   Lab Results  Component Value Date   INSULIN 8.0 01/15/2020   INSULIN 10.9 11/04/2018   INSULIN 13.2 08/13/2018   Lab Results  Component Value Date   TSH 1.670 01/15/2020   Lab Results  Component Value Date   CHOL 188 01/15/2020   HDL 52 01/15/2020   LDLCALC 124 (H) 01/15/2020   TRIG 63 01/15/2020   CHOLHDL 4 05/08/2018   Lab Results  Component Value Date   WBC 5.5 07/31/2019   HGB 12.0 07/31/2019   HCT 38.3 07/31/2019   MCV 90.8 07/31/2019   PLT 167 07/31/2019   No results found for: IRON, TIBC, FERRITIN  Obesity Behavioral Intervention:   Approximately 15 minutes were spent on the discussion below.  ASK: We discussed the diagnosis of obesity with [name] today and Damiah agreed to give [name] permission to discuss obesity behavioral modification therapy today.  ASSESS: Abri has the diagnosis of obesity and her BMI today is 36.7. Bianco is in the action stage of change.   ADVISE: Reise was educated on the multiple health risks of obesity as well as the benefit of weight loss to improve her health. She was advised of the need for long term treatment and the importance of lifestyle modifications to improve her current health and to decrease her risk of future health problems.  AGREE: Multiple dietary modification options and treatment options were discussed and Azusena agreed to follow the recommendations documented in the above note.  ARRANGE: Metta was educated on the importance of frequent visits to treat obesity as outlined per CMS and USPSTF guidelines and agreed to schedule her next follow up appointment today.  Attestation Statements:   Reviewed by clinician on day of visit: allergies, medications, problem list, medical history, surgical history, family history, social history, and previous encounter notes.  [name], am acting as Location manager for [employer], DO   I have reviewed the above documentation for  accuracy and completeness, and I agree with the above. Jearld Lesch, DO

## 2020-03-30 DIAGNOSIS — M25561 Pain in right knee: Secondary | ICD-10-CM | POA: Diagnosis not present

## 2020-03-30 DIAGNOSIS — M7651 Patellar tendinitis, right knee: Secondary | ICD-10-CM | POA: Diagnosis not present

## 2020-04-12 ENCOUNTER — Other Ambulatory Visit (INDEPENDENT_AMBULATORY_CARE_PROVIDER_SITE_OTHER): Payer: Self-pay | Admitting: Bariatrics

## 2020-04-12 ENCOUNTER — Other Ambulatory Visit: Payer: Self-pay

## 2020-04-12 DIAGNOSIS — E559 Vitamin D deficiency, unspecified: Secondary | ICD-10-CM

## 2020-04-12 NOTE — Telephone Encounter (Signed)
Dr [name] pt

## 2020-04-13 ENCOUNTER — Ambulatory Visit (INDEPENDENT_AMBULATORY_CARE_PROVIDER_SITE_OTHER): Payer: Medicare Other | Admitting: Bariatrics

## 2020-04-13 ENCOUNTER — Other Ambulatory Visit: Payer: Self-pay

## 2020-04-13 ENCOUNTER — Encounter (INDEPENDENT_AMBULATORY_CARE_PROVIDER_SITE_OTHER): Payer: Self-pay | Admitting: Bariatrics

## 2020-04-13 VITALS — BP 149/86 | HR 80 | Temp 97.5°F | Ht 62.0 in | Wt 194.0 lb

## 2020-04-13 DIAGNOSIS — Z6835 Body mass index (BMI) 35.0-35.9, adult: Secondary | ICD-10-CM | POA: Diagnosis not present

## 2020-04-13 DIAGNOSIS — E038 Other specified hypothyroidism: Secondary | ICD-10-CM

## 2020-04-13 DIAGNOSIS — E8881 Metabolic syndrome: Secondary | ICD-10-CM

## 2020-04-13 NOTE — Progress Notes (Signed)
Chief Complaint:   OBESITY Vanessa Christensen is here to discuss her progress with her obesity treatment plan along with follow-up of her obesity related diagnoses. Vanessa Christensen is on the Category 4 Plan and states she is following her eating plan approximately 30% of the time. Vanessa Christensen states she is exercising 0 minutes 0 times per week.  Today's visit was #: 45 Starting weight: 241 lbs Starting date: 08/13/2018 Today's weight: 194 lbs Today's date: 04/13/2020 Total lbs lost to date: 47 Total lbs lost since last in-office visit: 6  Interim History: Vanessa Christensen is down an additional 6 lbs since her last visit. She has been challenged due to pain in her right knee as she had been working out consistently. She states her appetite is decreased.  Subjective:   Other specified hypothyroidism. [name] is taking Synthroid.  Lab Results  Component Value Date   TSH 1.670 01/15/2020   Insulin resistance. Vanessa Christensen has a diagnosis of insulin resistance based on her elevated fasting insulin level >5. She continues to work on diet and exercise to decrease her risk of diabetes. Vanessa Christensen is on no medication. She endorses decreased appetite.  Lab Results  Component Value Date   INSULIN 8.0 01/15/2020   INSULIN 10.9 11/04/2018   INSULIN 13.2 08/13/2018   Lab Results  Component Value Date   HGBA1C 5.6 01/15/2020   Assessment/Plan:   Other specified hypothyroidism. Patient with long-standing hypothyroidism, on levothyroxine therapy. She appears euthyroid. Orders and follow up as documented in patient record. Chloe will continue Synthroid as directed.   Counseling . Good thyroid control is important for overall health. Supratherapeutic thyroid levels are dangerous and will not improve weight loss results. . The correct way to take levothyroxine is fasting, with water, separated by at least 30 minutes from breakfast, and separated by more than 4 hours from calcium, iron, multivitamins, acid reflux medications  (PPIs).   Insulin resistance. Vanessa Christensen will continue to work on weight loss, exercise, increasing healthy fats and protein, and decreasing simple carbohydrates to help decrease the risk of diabetes. Vanessa Christensen agreed to follow-up with [name] as directed to closely monitor her progress.  Class 2 severe obesity with serious comorbidity and body mass index (BMI) of 35.0 to 35.9 in adult, unspecified obesity type ([place]).  Vanessa Christensen is currently in the action stage of change. As such, her goal is to continue with weight loss efforts. She has agreed to the Category 4 Plan.   She will work on meal planning, being closely adherent to the plan, increasing fruits and vegetables, and being careful with choices while eating out.  Exercise goals: Vanessa Christensen will have surgery on December 3rd on her right knee and will have PT at home.  Behavioral modification strategies: increasing lean protein intake, decreasing simple carbohydrates, increasing vegetables, increasing water intake, decreasing eating out, no skipping meals, meal planning and cooking strategies, keeping healthy foods in the home and planning for success.  Vanessa Christensen has agreed to follow-up with our clinic in 4 weeks. She was informed of the importance of frequent follow-up visits to maximize her success with intensive lifestyle modifications for her multiple health conditions.   Objective:   Blood pressure (!) 149/86, pulse 80, temperature (!) 97.5 F (36.4 C), temperature source Oral, height 5\' 2"  (1.575 m), weight 194 lb (88 kg), SpO2 97 %. Body mass index is 35.48 kg/m.  General: Cooperative, alert, well developed, in no acute distress. HEENT: Conjunctivae and lids unremarkable. Cardiovascular: Regular rhythm.  Lungs: Normal work of breathing. Neurologic:  No focal deficits. Pt is using a cane for ambulation.  Lab Results  Component Value Date   CREATININE 0.70 01/15/2020   BUN 22 01/15/2020   NA 141 01/15/2020   K 4.5 01/15/2020   CL 105 01/15/2020     CO2 23 01/15/2020   Lab Results  Component Value Date   ALT 19 01/15/2020   AST 18 01/15/2020   ALKPHOS 71 01/15/2020   BILITOT 0.3 01/15/2020   Lab Results  Component Value Date   HGBA1C 5.6 01/15/2020   HGBA1C 5.4 11/04/2018   HGBA1C 5.6 08/13/2018   Lab Results  Component Value Date   INSULIN 8.0 01/15/2020   INSULIN 10.9 11/04/2018   INSULIN 13.2 08/13/2018   Lab Results  Component Value Date   TSH 1.670 01/15/2020   Lab Results  Component Value Date   CHOL 188 01/15/2020   HDL 52 01/15/2020   LDLCALC 124 (H) 01/15/2020   TRIG 63 01/15/2020   CHOLHDL 4 05/08/2018   Lab Results  Component Value Date   WBC 5.5 07/31/2019   HGB 12.0 07/31/2019   HCT 38.3 07/31/2019   MCV 90.8 07/31/2019   PLT 167 07/31/2019   No results found for: IRON, TIBC, FERRITIN  Obesity Behavioral Intervention:   Approximately 15 minutes were spent on the discussion below.  ASK: We discussed the diagnosis of obesity with [name] today and Amaliya agreed to give [name] permission to discuss obesity behavioral modification therapy today.  ASSESS: Vanessa Christensen has the diagnosis of obesity and her BMI today is 35.5. Vanessa Christensen is in the action stage of change.   ADVISE: Vanessa Christensen was educated on the multiple health risks of obesity as well as the benefit of weight loss to improve her health. She was advised of the need for long term treatment and the importance of lifestyle modifications to improve her current health and to decrease her risk of future health problems.  AGREE: Multiple dietary modification options and treatment options were discussed and Vanessa Christensen agreed to follow the recommendations documented in the above note.  ARRANGE: Vanessa Christensen was educated on the importance of frequent visits to treat obesity as outlined per CMS and USPSTF guidelines and agreed to schedule her next follow up appointment today.  Attestation Statements:   Reviewed by clinician on day of visit: allergies, medications,  problem list, medical history, surgical history, family history, social history, and previous encounter notes.  [name], am acting as Location manager for [employer], DO   I have reviewed the above documentation for accuracy and completeness, and I agree with the above. Jearld Lesch, DO

## 2020-04-14 ENCOUNTER — Encounter (INDEPENDENT_AMBULATORY_CARE_PROVIDER_SITE_OTHER): Payer: Self-pay | Admitting: Bariatrics

## 2020-04-27 ENCOUNTER — Other Ambulatory Visit: Payer: Self-pay | Admitting: Physician Assistant

## 2020-05-04 ENCOUNTER — Other Ambulatory Visit (HOSPITAL_COMMUNITY)
Admission: RE | Admit: 2020-05-04 | Discharge: 2020-05-04 | Disposition: A | Discharge status: Home / Self Care | Payer: Medicare Other | Source: Ambulatory Visit | Attending: Orthopaedic Surgery | Admitting: Orthopaedic Surgery

## 2020-05-04 DIAGNOSIS — Z20822 Contact with and (suspected) exposure to covid-19: Secondary | ICD-10-CM | POA: Insufficient documentation

## 2020-05-04 DIAGNOSIS — Z01812 Encounter for preprocedural laboratory examination: Secondary | ICD-10-CM | POA: Insufficient documentation

## 2020-05-04 LAB — SARS CORONAVIRUS 2 (TAT 6-24 HRS): SARS Coronavirus 2: NEGATIVE

## 2020-05-04 NOTE — Patient Instructions (Signed)
DUE TO COVID-19 ONLY ONE VISITOR IS ALLOWED TO COME WITH YOU AND STAY IN THE WAITING ROOM ONLY DURING PRE OP AND PROCEDURE.   IF YOU WILL BE ADMITTED INTO THE HOSPITAL YOU ARE ALLOWED ONE SUPPORT PERSON DURING VISITATION HOURS ONLY (10AM -8PM)   . The support person may change daily. . The support person must pass our screening, gel in and out, and wear a mask at all times, including in the patient's room. . Patients must also wear a mask when staff or their support person are in the room.         Your procedure is scheduled on:  Friday, 05-07-20   Report to [facility] Main  Entrance   Report to admitting at 9:45 AM   Call this number if you have problems the morning of surgery [phone]   Do not eat food :After Midnight.   May have liquids until 9:15 AM day of surgery  CLEAR LIQUID DIET  Foods Allowed                                                                     Foods Excluded  Water, Black Coffee and tea, regular and decaf             liquids that you cannot  Plain Jell-O in any flavor  (No red)                                    see through such as: Fruit ices (not with fruit pulp)                                      milk, soups, orange juice              Iced Popsicles (No red)                                      All solid food                                   Apple juices Sports drinks like Gatorade (No red) Lightly seasoned clear broth or consume(fat free) Sugar, honey syrup   Complete one Ensure drink the morning of surgery at 9:15 AM  the day of surgery.     Oral Hygiene is also important to reduce your risk of infection.                                    Remember - BRUSH YOUR TEETH THE MORNING OF SURGERY WITH YOUR REGULAR TOOTHPASTE   Do NOT smoke after Midnight   Take these medicines the morning of surgery with A SIP OF WATER: Prozac, Synthroid  You may not have any metal on your body including hair pins,  jewelry, and body piercings             Do not wear make-up, lotions, powders, perfumes/cologne, or deodorant             Do not wear nail polish.  Do not shave  48 hours prior to surgery.                Do not bring valuables to the hospital. [place].   Contacts, dentures or bridgework may not be worn into surgery.   Bring small overnight bag day of surgery.    Special Instructions: Bring a copy of your healthcare power of attorney and living will documents         the day of surgery if you haven't scanned them in before.              Please read over the following fact sheets you were given: IF YOU HAVE QUESTIONS ABOUT YOUR PRE OP INSTRUCTIONS PLEASE CALL [phone]   [place] - Preparing for Surgery Before surgery, you can play an important role.  Because skin is not sterile, your skin needs to be as free of germs as possible.  You can reduce the number of germs on your skin by washing with CHG (chlorahexidine gluconate) soap before surgery.  CHG is an antiseptic cleaner which kills germs and bonds with the skin to continue killing germs even after washing. Please DO NOT use if you have an allergy to CHG or antibacterial soaps.  If your skin becomes reddened/irritated stop using the CHG and inform your nurse when you arrive at Short Stay. Do not shave (including legs and underarms) for at least 48 hours prior to the first CHG shower.  You may shave your face/neck.  Please follow these instructions carefully:  1.  Shower with CHG Soap the night before surgery and the  morning of surgery.  2.  If you choose to wash your hair, wash your hair first as usual with your normal  shampoo.  3.  After you shampoo, rinse your hair and body thoroughly to remove the shampoo.                             4.  Use CHG as you would any other liquid soap.  You can apply chg directly to the skin and wash.  Gently with a scrungie or clean washcloth.  5.  Apply the  CHG Soap to your body ONLY FROM THE NECK DOWN.   Do   not use on face/ open                           Wound or open sores. Avoid contact with eyes, ears mouth and   genitals (private parts).                       Wash face,  Genitals (private parts) with your normal soap.             6.  Wash thoroughly, paying special attention to the area where your    surgery  will be performed.  7.  Thoroughly rinse your body with warm water from the neck down.  8.  DO NOT shower/wash with your normal soap after using and rinsing off  the CHG Soap.                9.  Pat yourself dry with a clean towel.            10.  Wear clean pajamas.            11.  Place clean sheets on your bed the night of your first shower and do not  sleep with pets. Day of Surgery : Do not apply any lotions/deodorants the morning of surgery.  Please wear clean clothes to the hospital/surgery center.  FAILURE TO FOLLOW THESE INSTRUCTIONS MAY RESULT IN THE CANCELLATION OF YOUR SURGERY  PATIENT SIGNATURE_________________________________  NURSE SIGNATURE__________________________________  ________________________________________________________________________   Vanessa Christensen  An incentive spirometer is a tool that can help keep your lungs clear and active. This tool measures how well you are filling your lungs with each breath. Taking long deep breaths may help reverse or decrease the chance of developing breathing (pulmonary) problems (especially infection) following:  A long period of time when you are unable to move or be active. BEFORE THE PROCEDURE   If the spirometer includes an indicator to show your best effort, your nurse or respiratory therapist will set it to a desired goal.  If possible, sit up straight or lean slightly forward. Try not to slouch.  Hold the incentive spirometer in an upright position. INSTRUCTIONS FOR USE  1. Sit on the edge of your bed if possible, or sit up as far as you can in bed or  on a chair. 2. Hold the incentive spirometer in an upright position. 3. Breathe out normally. 4. Place the mouthpiece in your mouth and seal your lips tightly around it. 5. Breathe in slowly and as deeply as possible, raising the piston or the ball toward the top of the column. 6. Hold your breath for 3-5 seconds or for as long as possible. Allow the piston or ball to fall to the bottom of the column. 7. Remove the mouthpiece from your mouth and breathe out normally. 8. Rest for a few seconds and repeat Steps 1 through 7 at least 10 times every 1-2 hours when you are awake. Take your time and take a few normal breaths between deep breaths. 9. The spirometer may include an indicator to show your best effort. Use the indicator as a goal to work toward during each repetition. 10. After each set of 10 deep breaths, practice coughing to be sure your lungs are clear. If you have an incision (the cut made at the time of surgery), support your incision when coughing by placing a pillow or rolled up towels firmly against it. Once you are able to get out of bed, walk around indoors and cough well. You may stop using the incentive spirometer when instructed by your caregiver.  RISKS AND COMPLICATIONS  Take your time so you do not get dizzy or light-headed.  If you are in pain, you may need to take or ask for pain medication before doing incentive spirometry. It is harder to take a deep breath if you are having pain. AFTER USE  Rest and breathe slowly and easily.  It can be helpful to keep track of a log of your progress. Your caregiver can provide you with a simple table to help with this. If you are using the spirometer at home, follow these instructions: [place] IF:   You are having difficultly using the spirometer.  You have trouble using the spirometer as  often as instructed.  Your pain medication is not giving enough relief while using the spirometer.  You develop fever of 100.5 F  (38.1 C) or higher. SEEK IMMEDIATE MEDICAL CARE IF:   You cough up bloody sputum that had not been present before.  You develop fever of 102 F (38.9 C) or greater.  You develop worsening pain at or near the incision site. MAKE SURE YOU:   Understand these instructions.  Will watch your condition.  Will get help right away if you are not doing well or get worse. Document Released: 10/02/2006 Document Revised: 08/14/2011 Document Reviewed: 12/03/2006 Exit[employer] Patient Information 2014 [employer], [place].   ________________________________________________________________________  WHAT IS A BLOOD TRANSFUSION? Blood Transfusion Information  A transfusion is the replacement of blood or some of its parts. Blood is made up of multiple cells which provide different functions.  Red blood cells carry oxygen and are used for blood loss replacement.  White blood cells fight against infection.  Platelets control bleeding.  Plasma helps clot blood.  Other blood products are available for specialized needs, such as hemophilia or other clotting disorders. BEFORE THE TRANSFUSION  Who gives blood for transfusions?   Healthy volunteers who are fully evaluated to make sure their blood is safe. This is blood bank blood. Transfusion therapy is the safest it has ever been in the practice of medicine. Before blood is taken from a donor, a complete history is taken to make sure that person has no history of diseases nor engages in risky social behavior (examples are intravenous drug use or sexual activity with multiple partners). The donor's travel history is screened to minimize risk of transmitting infections, such as malaria. The donated blood is tested for signs of infectious diseases, such as HIV and hepatitis. The blood is then tested to be sure it is compatible with you in order to minimize the chance of a transfusion reaction. If you or a relative donates blood, this is often done in anticipation  of surgery and is not appropriate for emergency situations. It takes many days to process the donated blood. RISKS AND COMPLICATIONS Although transfusion therapy is very safe and saves many lives, the main dangers of transfusion include:   Getting an infectious disease.  Developing a transfusion reaction. This is an allergic reaction to something in the blood you were given. Every precaution is taken to prevent this. The decision to have a blood transfusion has been considered carefully by your caregiver before blood is given. Blood is not given unless the benefits outweigh the risks. AFTER THE TRANSFUSION  Right after receiving a blood transfusion, you will usually feel much better and more energetic. This is especially true if your red blood cells have gotten low (anemic). The transfusion raises the level of the red blood cells which carry oxygen, and this usually causes an energy increase.  The nurse administering the transfusion will monitor you carefully for complications. HOME CARE INSTRUCTIONS  No special instructions are needed after a transfusion. You may find your energy is better. Speak with your caregiver about any limitations on activity for underlying diseases you may have. SEEK MEDICAL CARE IF:   Your condition is not improving after your transfusion.  You develop redness or irritation at the intravenous (IV) site. SEEK IMMEDIATE MEDICAL CARE IF:  Any of the following symptoms occur over the next 12 hours:  Shaking chills.  You have a temperature by mouth above 102 F (38.9 C), not controlled by medicine.  Chest, back, or  muscle pain.  People around you feel you are not acting correctly or are confused.  Shortness of breath or difficulty breathing.  Dizziness and fainting.  You get a rash or develop hives.  You have a decrease in urine output.  Your urine turns a dark color or changes to pink, red, or brown. Any of the following symptoms occur over the next 10  days:  You have a temperature by mouth above 102 F (38.9 C), not controlled by medicine.  Shortness of breath.  Weakness after normal activity.  The white part of the eye turns yellow (jaundice).  You have a decrease in the amount of urine or are urinating less often.  Your urine turns a dark color or changes to pink, red, or brown. Document Released: 05/19/2000 Document Revised: 08/14/2011 Document Reviewed: 01/06/2008 [facility] Patient Information 2014 [place], [place].  _______________________________________________________________________

## 2020-05-04 NOTE — Progress Notes (Addendum)
COVID Vaccine Completed: x3 Date COVID Vaccine completed:  07-19-19, 08-11-19, Booster 03-08-20 COVID vaccine manufacturer: [employer]   PCP - Annye Asa, MD Cardiologist - N/A  Chest x-ray -  EKG -  Stress Test -  ECHO -  Cardiac Cath -  Pacemaker/ICD device last checked:  Sleep Study -10+ years ago, +sleep apnea CPAP - No  Fasting Blood Sugar -  Checks Blood Sugar _____ times a day  Blood Thinner Instructions: Aspirin Instructions: Last Dose:  Anesthesia review:  Pt states PCP said she had a murmur but no workup was done.  Pt evaluated by Janett Billow at PAT and no appreciable murmur heard.  Patient denies shortness of breath, fever, cough and chest pain at PAT appointment.  Patient unable to climb stairs due to knee pain, was able to prior.  Performs ADL's without assistance.   Patient verbalized understanding of instructions that were given to them at the PAT appointment. Patient was also instructed that they will need to review over the PAT instructions again at home before surgery.

## 2020-05-05 ENCOUNTER — Encounter (HOSPITAL_COMMUNITY)
Admission: RE | Admit: 2020-05-05 | Discharge: 2020-05-05 | Disposition: A | Discharge status: Home / Self Care | Payer: Medicare Other | Source: Ambulatory Visit | Attending: Orthopaedic Surgery | Admitting: Orthopaedic Surgery

## 2020-05-05 ENCOUNTER — Other Ambulatory Visit: Payer: Self-pay

## 2020-05-05 ENCOUNTER — Encounter (HOSPITAL_COMMUNITY): Payer: Self-pay

## 2020-05-05 DIAGNOSIS — Z01812 Encounter for preprocedural laboratory examination: Secondary | ICD-10-CM | POA: Insufficient documentation

## 2020-05-05 LAB — CBC
HCT: 36.7 % (ref 36.0–46.0)
Hemoglobin: 11.7 g/dL — ABNORMAL LOW (ref 12.0–15.0)
MCH: 29.7 pg (ref 26.0–34.0)
MCHC: 31.9 g/dL (ref 30.0–36.0)
MCV: 93.1 fL (ref 80.0–100.0)
Platelets: 179 10*3/uL (ref 150–400)
RBC: 3.94 MIL/uL (ref 3.87–5.11)
RDW: 13.3 % (ref 11.5–15.5)
WBC: 4 10*3/uL (ref 4.0–10.5)
nRBC: 0 % (ref 0.0–0.2)

## 2020-05-05 LAB — SURGICAL PCR SCREEN
MRSA, PCR: NEGATIVE
Staphylococcus aureus: NEGATIVE

## 2020-05-06 DIAGNOSIS — T84012D Broken internal right knee prosthesis, subsequent encounter: Secondary | ICD-10-CM

## 2020-05-06 NOTE — H&P (Signed)
TOTAL KNEE REVISION ADMISSION H&P  Patient is being admitted for right revision total knee arthroplasty.  Subjective:  Chief Complaint:right knee pain.  HPI: Vanessa Christensen, 69 y.o. female, has a history of pain and functional disability in the right knee(s) due to failed previous arthroplasty and patient has failed non-surgical conservative treatments for greater than 12 weeks to include NSAID's and/or analgesics, flexibility and strengthening excercises, use of assistive devices, weight reduction as appropriate and activity modification. The indications for the revision of the total knee arthroplasty are loosening of one or more components. Onset of symptoms was gradual starting 1 years ago with gradually worsening course since that time.  Prior procedures on the right knee(s) include arthroplasty.  Patient currently rates pain in the right knee(s) at 10 out of 10 with activity. There is worsening of pain with activity and weight bearing, pain that interferes with activities of daily living, pain with passive range of motion and joint swelling.  Patient has evidence of prosthetic loosening by imaging studies. This condition presents safety issues increasing the risk of falls.  There is no current active infection.  Patient Active Problem List   Diagnosis Date Noted  . Failed total knee, right, 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/2020  . History of total right knee replacement 01/28/2020  . Vitamin D deficiency 08/29/2018  . 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 Class 3 severe obesity with serious comorbidity and body mass index (BMI) of 40.0 to 44.9 in adult ([place]) 08/14/2018  . 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 Incontinence of urine in female 11/07/2017  . Vertigo 11/07/2017  . Left knee pain 10/25/2016  . Genetic testing 08/08/2016  . Hypothyroidism 12/02/2015  . Anxiety and depression 12/02/2015  . OAB (overactive bladder) 12/02/2015  . Breast cancer of upper-outer quadrant of left female breast  ([place]) 12/02/2015  . Obesity (BMI 30-39.9) 12/02/2015   Past Medical History:  Diagnosis Date  . Anemia    hx of  . Anxiety    pt denies  . Arthritis    hands and feet  . Cancer ([place]) 10/04/2006   breast  . Cataracts, both eyes   . Depression   . Depression   . Diverticulosis of colon   . Glaucoma   . Heart murmur    No work up done 5 years ago  . Joint pain   . Knee pain   . Obesity   . OSA on CPAP   . Osteoarthritis   . Sleep apnea    no CPAP- no longer needed d/t weight loss  . Thyroid activity decreased     Past Surgical History:  Procedure Laterality Date  . BREAST SURGERY Bilateral   . CATARACT EXTRACTION    . COLONOSCOPY    . GLAUCOMA REPAIR    . MASTECTOMY, RADICAL Bilateral   . neulasta induced sterile abscesses    . THYROIDECTOMY, PARTIAL    . TONSILLECTOMY    . TOTAL KNEE ARTHROPLASTY Right     No current facility-administered medications for this encounter.   Current Outpatient Medications  Medication Sig Dispense Refill Last Dose  . acetaminophen (TYLENOL) 500 MG tablet Take 1,000 mg by mouth every 6 (six) hours as needed (pain).     . AZOPT 1 % ophthalmic suspension Place 1 drop into both eyes in the morning and at bedtime.   12   . FLUoxetine (PROZAC) 20 MG capsule TAKE 1 CAPSULE BY MOUTH EVERY DAY (Patient taking differently: Take 20 mg by mouth daily. ) 90 capsule 2   .  ibuprofen (ADVIL) 200 MG tablet Take 600 mg by mouth every 6 (six) hours as needed (PAIN).     Marland Kitchen levothyroxine (SYNTHROID) 75 MCG tablet Take 1 tablet (75 mcg total) by mouth daily. 30 tablet 0   . meclizine (ANTIVERT) 25 MG tablet Take 25 mg by mouth 3 (three) times daily as needed for dizziness.     . psyllium (METAMUCIL) 58.6 % powder Take 1 packet by mouth daily.      Marland Kitchen tolterodine (DETROL LA) 4 MG 24 hr capsule TAKE 1 CAPSULE BY MOUTH EVERY DAY (Patient taking differently: Take 4 mg by mouth daily. ) 90 capsule 2   . traMADol (ULTRAM) 50 MG tablet Take 1 tablet (50 mg total) by  mouth every 8 (eight) hours as needed for severe pain. (Patient taking differently: Take 50 mg by mouth daily as needed for severe pain. ) 15 tablet 0   . Vitamin D, Ergocalciferol, (DRISDOL) 1.25 MG (50000 UNIT) CAPS capsule Take 1 capsule (50,000 Units total) by mouth every 7 (seven) days. (Patient taking differently: Take 50,000 Units by mouth every Sunday. In the morning) 4 capsule 0   . Apoaequorin (PREVAGEN PO) Take 1 tablet by mouth daily.      . Ascorbic Acid (VITAMIN C PO) Take 1 tablet by mouth daily.     Marland Kitchen aspirin EC 81 MG tablet Take 81 mg by mouth daily. Swallow whole.     . B Complex-C (SUPER B COMPLEX PO) Take 1 tablet by mouth daily after supper.     . meloxicam (MOBIC) 15 MG tablet TAKE 1 TABLET BY MOUTH EVERY DAY (Patient not taking: Reported on 04/27/2020) 30 tablet 0 Not Taking at Unknown time  . Omega-3 Fatty Acids (FISH OIL ADULT GUMMIES) 113.5 MG CHEW Chew 2 tablets by mouth daily at 2 PM.     . TURMERIC PO Take 1,500 mg by mouth daily after supper.     Marland Kitchen VITAMIN D, CHOLECALCIFEROL, PO Take 25 mcg by mouth daily.       Allergies  Allergen Reactions  . Neulasta [Pegfilgrastim] Other (See Comments)    Raised lumps on legs, arms - had to be excised =  Happened every time pt received Neulasta.    Social History   Tobacco Use  . Smoking status: Former Smoker    Quit date: 11/03/1978    Years since quitting: 41.5  . Smokeless tobacco: Never Used  Substance Use Topics  . Alcohol use: No    Family History  Problem Relation Age of Onset  . CVA Mother   . Cancer Mother 4       breast cancer   . Heart disease Mother   . Stroke Mother   . Obesity Mother   . Leukemia Father   . High blood pressure Father   . High Cholesterol Father   . Heart disease Father   . Cancer Maternal Aunt 55       breast cancer   . Cancer Maternal Aunt 55       breast cancer  . Colon cancer Maternal Aunt   . Cancer Cousin 32       breast cancer  . Esophageal cancer Neg Hx   . Rectal  cancer Neg Hx   . Stomach cancer Neg Hx       Review of Systems  Musculoskeletal: Positive for gait problem and joint swelling.  All other systems reviewed and are negative.    Objective:  Physical Exam Vitals reviewed.  Constitutional:      Appearance: Normal appearance.  HENT:     Head: Normocephalic and atraumatic.  Eyes:     Extraocular Movements: Extraocular movements intact.     Pupils: Pupils are equal, round, and reactive to light.  Cardiovascular:     Rate and Rhythm: Normal rate.     Pulses: Normal pulses.  Pulmonary:     Effort: Pulmonary effort is normal.  Abdominal:     Palpations: Abdomen is soft.  Musculoskeletal:     Cervical back: Normal range of motion.     Right knee: Effusion and bony tenderness present. Tenderness present over the medial joint line and lateral joint line. Abnormal alignment.  Neurological:     Mental Status: She is alert and oriented to person, place, and time.  Psychiatric:        Behavior: Behavior normal.     Vital signs in last 24 hours:    Labs:  Estimated body mass index is 37.02 kg/m as calculated from the following:   Height as of 05/05/20: 5\' 2"  (1.575 m).   Weight as of 05/05/20: 91.8 kg.  Imaging Review Plain radiographs demonstrate loosening of the tibial component of a right total knee arthroplasty.   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  Obviously if there is loosening of the femoral component we will address that interoperatively as well.  A long and thorough discussion was had with the patient about the surgery and what to expect from an intraoperative and postoperative standpoint.  The risk and benefits of surgery were discussed in detail.

## 2020-05-07 ENCOUNTER — Encounter (HOSPITAL_COMMUNITY)
Admission: RE | Disposition: A | Discharge status: Home / Self Care | Payer: Self-pay | Source: Home / Self Care | Attending: Orthopaedic Surgery

## 2020-05-07 ENCOUNTER — Inpatient Hospital Stay (HOSPITAL_COMMUNITY): Payer: Medicare Other | Admitting: Certified Registered"

## 2020-05-07 ENCOUNTER — Inpatient Hospital Stay (HOSPITAL_COMMUNITY): Payer: Medicare Other

## 2020-05-07 ENCOUNTER — Other Ambulatory Visit: Payer: Self-pay

## 2020-05-07 ENCOUNTER — Inpatient Hospital Stay (HOSPITAL_COMMUNITY): Payer: Medicare Other | Admitting: Physician Assistant

## 2020-05-07 ENCOUNTER — Inpatient Hospital Stay (HOSPITAL_COMMUNITY)
Admission: RE | Admit: 2020-05-07 | Discharge: 2020-05-09 | DRG: 467 | Disposition: A | Discharge status: Home / Self Care | Payer: Medicare Other | Attending: Orthopaedic Surgery | Admitting: Orthopaedic Surgery

## 2020-05-07 DIAGNOSIS — T84032A Mechanical loosening of internal right knee prosthetic joint, initial encounter: Principal | ICD-10-CM | POA: Diagnosis present

## 2020-05-07 DIAGNOSIS — T84092A Other mechanical complication of internal right knee prosthesis, initial encounter: Secondary | ICD-10-CM | POA: Diagnosis not present

## 2020-05-07 DIAGNOSIS — Z7982 Long term (current) use of aspirin: Secondary | ICD-10-CM | POA: Diagnosis not present

## 2020-05-07 DIAGNOSIS — Z79891 Long term (current) use of opiate analgesic: Secondary | ICD-10-CM | POA: Diagnosis not present

## 2020-05-07 DIAGNOSIS — Z471 Aftercare following joint replacement surgery: Secondary | ICD-10-CM | POA: Diagnosis not present

## 2020-05-07 DIAGNOSIS — T84012D Broken internal right knee prosthesis, subsequent encounter: Secondary | ICD-10-CM

## 2020-05-07 DIAGNOSIS — F32A Depression, unspecified: Secondary | ICD-10-CM | POA: Diagnosis present

## 2020-05-07 DIAGNOSIS — Z853 Personal history of malignant neoplasm of breast: Secondary | ICD-10-CM

## 2020-05-07 DIAGNOSIS — E8881 Metabolic syndrome: Secondary | ICD-10-CM | POA: Diagnosis present

## 2020-05-07 DIAGNOSIS — T8484XA Pain due to internal orthopedic prosthetic devices, implants and grafts, initial encounter: Secondary | ICD-10-CM | POA: Diagnosis not present

## 2020-05-07 DIAGNOSIS — Z6841 Body Mass Index (BMI) 40.0 and over, adult: Secondary | ICD-10-CM

## 2020-05-07 DIAGNOSIS — Z806 Family history of leukemia: Secondary | ICD-10-CM | POA: Diagnosis not present

## 2020-05-07 DIAGNOSIS — N3281 Overactive bladder: Secondary | ICD-10-CM | POA: Diagnosis present

## 2020-05-07 DIAGNOSIS — G4733 Obstructive sleep apnea (adult) (pediatric): Secondary | ICD-10-CM | POA: Diagnosis not present

## 2020-05-07 DIAGNOSIS — E039 Hypothyroidism, unspecified: Secondary | ICD-10-CM | POA: Diagnosis not present

## 2020-05-07 DIAGNOSIS — E559 Vitamin D deficiency, unspecified: Secondary | ICD-10-CM | POA: Diagnosis not present

## 2020-05-07 DIAGNOSIS — Z20822 Contact with and (suspected) exposure to covid-19: Secondary | ICD-10-CM | POA: Diagnosis not present

## 2020-05-07 DIAGNOSIS — Z79899 Other long term (current) drug therapy: Secondary | ICD-10-CM

## 2020-05-07 DIAGNOSIS — Z96651 Presence of right artificial knee joint: Secondary | ICD-10-CM

## 2020-05-07 DIAGNOSIS — Z803 Family history of malignant neoplasm of breast: Secondary | ICD-10-CM | POA: Diagnosis not present

## 2020-05-07 DIAGNOSIS — F419 Anxiety disorder, unspecified: Secondary | ICD-10-CM | POA: Diagnosis present

## 2020-05-07 DIAGNOSIS — Z823 Family history of stroke: Secondary | ICD-10-CM

## 2020-05-07 DIAGNOSIS — Z888 Allergy status to other drugs, medicaments and biological substances status: Secondary | ICD-10-CM | POA: Diagnosis not present

## 2020-05-07 DIAGNOSIS — Z79890 Hormone replacement therapy (postmenopausal): Secondary | ICD-10-CM | POA: Diagnosis not present

## 2020-05-07 DIAGNOSIS — Z8249 Family history of ischemic heart disease and other diseases of the circulatory system: Secondary | ICD-10-CM

## 2020-05-07 DIAGNOSIS — Z83438 Family history of other disorder of lipoprotein metabolism and other lipidemia: Secondary | ICD-10-CM

## 2020-05-07 DIAGNOSIS — Z87891 Personal history of nicotine dependence: Secondary | ICD-10-CM

## 2020-05-07 DIAGNOSIS — Z800 Family history of malignant neoplasm of digestive organs: Secondary | ICD-10-CM | POA: Diagnosis not present

## 2020-05-07 DIAGNOSIS — Z791 Long term (current) use of non-steroidal anti-inflammatories (NSAID): Secondary | ICD-10-CM

## 2020-05-07 DIAGNOSIS — Y792 Prosthetic and other implants, materials and accessory orthopedic devices associated with adverse incidents: Secondary | ICD-10-CM | POA: Diagnosis present

## 2020-05-07 DIAGNOSIS — Z9889 Other specified postprocedural states: Secondary | ICD-10-CM | POA: Diagnosis not present

## 2020-05-07 DIAGNOSIS — D638 Anemia in other chronic diseases classified elsewhere: Secondary | ICD-10-CM | POA: Diagnosis not present

## 2020-05-07 HISTORY — PX: TOTAL KNEE REVISION: SHX996

## 2020-05-07 LAB — TYPE AND SCREEN
ABO/RH(D): O NEG
Antibody Screen: NEGATIVE

## 2020-05-07 LAB — ABO/RH: ABO/RH(D): O NEG

## 2020-05-07 SURGERY — TOTAL KNEE REVISION
Anesthesia: Spinal | Site: Knee | Laterality: Right

## 2020-05-07 MED ORDER — SODIUM CHLORIDE 0.9 % IR SOLN
Status: DC | PRN
  Administered 2020-05-07: 1000 mL

## 2020-05-07 MED ORDER — BUPIVACAINE IN DEXTROSE 0.75-8.25 % IT SOLN
INTRATHECAL | Status: DC | PRN
  Administered 2020-05-07: 1.7 mL via INTRATHECAL

## 2020-05-07 MED ORDER — CHLORHEXIDINE GLUCONATE 0.12 % MT SOLN
15.0000 mL | Freq: Once | OROMUCOSAL | Status: AC
  Administered 2020-05-07: 15 mL via OROMUCOSAL

## 2020-05-07 MED ORDER — PHENYLEPHRINE HCL-NACL 10-0.9 MG/250ML-% IV SOLN
INTRAVENOUS | Status: DC | PRN
  Administered 2020-05-07: 35 ug/min via INTRAVENOUS

## 2020-05-07 MED ORDER — OXYCODONE HCL 5 MG PO TABS
10.0000 mg | ORAL_TABLET | ORAL | Status: DC | PRN
  Administered 2020-05-07 – 2020-05-08 (×5): 15 mg via ORAL
  Administered 2020-05-08: 10 mg via ORAL
  Administered 2020-05-09: 15 mg via ORAL
  Filled 2020-05-07 (×6): qty 3

## 2020-05-07 MED ORDER — BRINZOLAMIDE 1 % OP SUSP
1.0000 [drp] | Freq: Two times a day (BID) | OPHTHALMIC | Status: DC
  Administered 2020-05-07 – 2020-05-09 (×4): 1 [drp] via OPHTHALMIC
  Filled 2020-05-07: qty 10

## 2020-05-07 MED ORDER — ONDANSETRON HCL 4 MG/2ML IJ SOLN: 4.0000 mg | Freq: Once | INTRAMUSCULAR | Status: DC | PRN

## 2020-05-07 MED ORDER — PHENYLEPHRINE HCL (PRESSORS) 10 MG/ML IV SOLN
INTRAVENOUS | Status: AC
  Filled 2020-05-07: qty 1

## 2020-05-07 MED ORDER — OXYCODONE HCL 5 MG PO TABS: 5.0000 mg | ORAL_TABLET | Freq: Once | ORAL | Status: DC | PRN

## 2020-05-07 MED ORDER — BUPIVACAINE-EPINEPHRINE (PF) 0.25% -1:200000 IJ SOLN
INTRAMUSCULAR | Status: DC | PRN
  Administered 2020-05-07: 30 mL

## 2020-05-07 MED ORDER — METOCLOPRAMIDE HCL 5 MG/ML IJ SOLN: 5.0000 mg | Freq: Three times a day (TID) | INTRAMUSCULAR | Status: DC | PRN

## 2020-05-07 MED ORDER — MIDAZOLAM HCL 2 MG/2ML IJ SOLN
1.0000 mg | Freq: Once | INTRAMUSCULAR | Status: AC
  Administered 2020-05-07: 1 mg via INTRAVENOUS
  Filled 2020-05-07: qty 2

## 2020-05-07 MED ORDER — MIDAZOLAM HCL 2 MG/2ML IJ SOLN: 1.0000 mg | Freq: Once | INTRAMUSCULAR | Status: AC

## 2020-05-07 MED ORDER — FLUOXETINE HCL 20 MG PO CAPS
20.0000 mg | ORAL_CAPSULE | Freq: Every day | ORAL | Status: DC
  Administered 2020-05-08 – 2020-05-09 (×2): 20 mg via ORAL
  Filled 2020-05-07 (×2): qty 1

## 2020-05-07 MED ORDER — ONDANSETRON HCL 4 MG/2ML IJ SOLN
INTRAMUSCULAR | Status: DC | PRN
  Administered 2020-05-07: 4 mg via INTRAVENOUS

## 2020-05-07 MED ORDER — OXYCODONE HCL 5 MG/5ML PO SOLN: 5.0000 mg | Freq: Once | ORAL | Status: DC | PRN

## 2020-05-07 MED ORDER — MIDAZOLAM HCL 2 MG/2ML IJ SOLN
INTRAMUSCULAR | Status: AC
  Administered 2020-05-07: 1 mg via INTRAVENOUS
  Filled 2020-05-07: qty 2

## 2020-05-07 MED ORDER — POVIDONE-IODINE 10 % EX SWAB
2.0000 "application " | Freq: Once | CUTANEOUS | Status: AC
  Administered 2020-05-07: 2 via TOPICAL

## 2020-05-07 MED ORDER — ASPIRIN EC 325 MG PO TBEC
325.0000 mg | DELAYED_RELEASE_TABLET | Freq: Two times a day (BID) | ORAL | Status: DC
  Administered 2020-05-08 – 2020-05-09 (×3): 325 mg via ORAL
  Filled 2020-05-07 (×3): qty 1

## 2020-05-07 MED ORDER — METOCLOPRAMIDE HCL 5 MG PO TABS: 5.0000 mg | ORAL_TABLET | Freq: Three times a day (TID) | ORAL | Status: DC | PRN

## 2020-05-07 MED ORDER — PANTOPRAZOLE SODIUM 40 MG PO TBEC
40.0000 mg | DELAYED_RELEASE_TABLET | Freq: Every day | ORAL | Status: DC
  Administered 2020-05-07 – 2020-05-09 (×3): 40 mg via ORAL
  Filled 2020-05-07 (×3): qty 1

## 2020-05-07 MED ORDER — LEVOTHYROXINE SODIUM 75 MCG PO TABS
75.0000 ug | ORAL_TABLET | Freq: Every day | ORAL | Status: DC
  Administered 2020-05-08 – 2020-05-09 (×2): 75 ug via ORAL
  Filled 2020-05-07 (×2): qty 1

## 2020-05-07 MED ORDER — ALUM & MAG HYDROXIDE-SIMETH 200-200-20 MG/5ML PO SUSP: 30.0000 mL | ORAL | Status: DC | PRN

## 2020-05-07 MED ORDER — CEFAZOLIN SODIUM-DEXTROSE 1-4 GM/50ML-% IV SOLN
1.0000 g | Freq: Four times a day (QID) | INTRAVENOUS | Status: AC
  Administered 2020-05-07 – 2020-05-08 (×2): 1 g via INTRAVENOUS
  Filled 2020-05-07 (×2): qty 50

## 2020-05-07 MED ORDER — SENNA 8.6 MG PO TABS
1.0000 | ORAL_TABLET | Freq: Two times a day (BID) | ORAL | Status: DC
  Administered 2020-05-07 – 2020-05-09 (×4): 8.6 mg via ORAL
  Filled 2020-05-07 (×4): qty 1

## 2020-05-07 MED ORDER — 0.9 % SODIUM CHLORIDE (POUR BTL) OPTIME
TOPICAL | Status: DC | PRN
  Administered 2020-05-07: 1000 mL

## 2020-05-07 MED ORDER — SODIUM CHLORIDE 0.9 % IV SOLN: INTRAVENOUS | Status: DC

## 2020-05-07 MED ORDER — OXYCODONE HCL 5 MG PO TABS
5.0000 mg | ORAL_TABLET | ORAL | Status: DC | PRN
  Filled 2020-05-07: qty 2

## 2020-05-07 MED ORDER — PROPOFOL 500 MG/50ML IV EMUL
INTRAVENOUS | Status: DC | PRN
  Administered 2020-05-07: 75 ug/kg/min via INTRAVENOUS

## 2020-05-07 MED ORDER — LACTATED RINGERS IV SOLN: INTRAVENOUS | Status: DC

## 2020-05-07 MED ORDER — ACETAMINOPHEN 325 MG PO TABS
325.0000 mg | ORAL_TABLET | Freq: Four times a day (QID) | ORAL | Status: DC | PRN
  Administered 2020-05-08 (×3): 650 mg via ORAL
  Filled 2020-05-07 (×3): qty 2

## 2020-05-07 MED ORDER — BISACODYL 10 MG RE SUPP: 10.0000 mg | Freq: Every day | RECTAL | Status: DC | PRN

## 2020-05-07 MED ORDER — TRANEXAMIC ACID-NACL 1000-0.7 MG/100ML-% IV SOLN
1000.0000 mg | INTRAVENOUS | Status: AC
  Administered 2020-05-07: 1000 mg via INTRAVENOUS
  Filled 2020-05-07: qty 100

## 2020-05-07 MED ORDER — FENTANYL CITRATE (PF) 100 MCG/2ML IJ SOLN
INTRAMUSCULAR | Status: AC
  Administered 2020-05-07: 25 ug via INTRAVENOUS
  Filled 2020-05-07: qty 2

## 2020-05-07 MED ORDER — CHLORHEXIDINE GLUCONATE CLOTH 2 % EX PADS
6.0000 | MEDICATED_PAD | Freq: Every day | CUTANEOUS | Status: DC
  Administered 2020-05-09: 6 via TOPICAL

## 2020-05-07 MED ORDER — PROPOFOL 500 MG/50ML IV EMUL
INTRAVENOUS | Status: AC
  Filled 2020-05-07: qty 50

## 2020-05-07 MED ORDER — BUPIVACAINE-EPINEPHRINE (PF) 0.25% -1:200000 IJ SOLN
INTRAMUSCULAR | Status: AC
  Filled 2020-05-07: qty 30

## 2020-05-07 MED ORDER — MAGNESIUM HYDROXIDE 400 MG/5ML PO SUSP: 30.0000 mL | Freq: Every day | ORAL | Status: DC | PRN

## 2020-05-07 MED ORDER — CEFAZOLIN SODIUM-DEXTROSE 2-4 GM/100ML-% IV SOLN
INTRAVENOUS | Status: AC
  Filled 2020-05-07: qty 100

## 2020-05-07 MED ORDER — ORAL CARE MOUTH RINSE: 15.0000 mL | Freq: Once | OROMUCOSAL | Status: AC

## 2020-05-07 MED ORDER — PROPOFOL 10 MG/ML IV BOLUS
INTRAVENOUS | Status: DC | PRN
  Administered 2020-05-07: 10 mg via INTRAVENOUS

## 2020-05-07 MED ORDER — FENTANYL CITRATE (PF) 100 MCG/2ML IJ SOLN
50.0000 ug | Freq: Once | INTRAMUSCULAR | Status: AC
  Administered 2020-05-07: 50 ug via INTRAVENOUS
  Filled 2020-05-07: qty 2

## 2020-05-07 MED ORDER — STERILE WATER FOR IRRIGATION IR SOLN
Status: DC | PRN
  Administered 2020-05-07: 1000 mL

## 2020-05-07 MED ORDER — ONDANSETRON HCL 4 MG/2ML IJ SOLN
4.0000 mg | Freq: Four times a day (QID) | INTRAMUSCULAR | Status: DC | PRN
  Administered 2020-05-08: 4 mg via INTRAVENOUS
  Filled 2020-05-07: qty 2

## 2020-05-07 MED ORDER — ASCORBIC ACID 500 MG PO TABS
500.0000 mg | ORAL_TABLET | Freq: Every day | ORAL | Status: DC
  Administered 2020-05-08 – 2020-05-09 (×2): 500 mg via ORAL
  Filled 2020-05-07 (×2): qty 1

## 2020-05-07 MED ORDER — HYDROMORPHONE HCL 1 MG/ML IJ SOLN
0.5000 mg | INTRAMUSCULAR | Status: DC | PRN
  Administered 2020-05-07 (×2): 1 mg via INTRAVENOUS
  Filled 2020-05-07 (×2): qty 1

## 2020-05-07 MED ORDER — PROPOFOL 1000 MG/100ML IV EMUL
INTRAVENOUS | Status: AC
  Filled 2020-05-07: qty 100

## 2020-05-07 MED ORDER — FENTANYL CITRATE (PF) 100 MCG/2ML IJ SOLN
INTRAMUSCULAR | Status: AC
  Administered 2020-05-07: 50 ug via INTRAVENOUS
  Filled 2020-05-07: qty 2

## 2020-05-07 MED ORDER — CEFAZOLIN SODIUM-DEXTROSE 2-3 GM-%(50ML) IV SOLR
INTRAVENOUS | Status: DC | PRN
  Administered 2020-05-07: 2 g via INTRAVENOUS

## 2020-05-07 MED ORDER — FESOTERODINE FUMARATE ER 8 MG PO TB24
8.0000 mg | ORAL_TABLET | Freq: Every day | ORAL | Status: DC
  Administered 2020-05-07 – 2020-05-09 (×3): 8 mg via ORAL
  Filled 2020-05-07 (×4): qty 1

## 2020-05-07 MED ORDER — ONDANSETRON HCL 4 MG PO TABS
4.0000 mg | ORAL_TABLET | Freq: Four times a day (QID) | ORAL | Status: DC | PRN
  Administered 2020-05-08: 4 mg via ORAL
  Filled 2020-05-07: qty 1

## 2020-05-07 MED ORDER — FENTANYL CITRATE (PF) 100 MCG/2ML IJ SOLN
25.0000 ug | INTRAMUSCULAR | Status: DC | PRN
  Administered 2020-05-07: 25 ug via INTRAVENOUS
  Administered 2020-05-07: 50 ug via INTRAVENOUS

## 2020-05-07 SURGICAL SUPPLY — 64 items
AUG TIB ATTUNE LM/RL SZ3/4X15 (Joint) ×2 IMPLANT
AUG TIB ATTUNE RM/LL SZ3/4X15 (Joint) ×2 IMPLANT
AUGMENT TIB ATT LM/RL SZ3/4X15 (Joint) ×1 IMPLANT
AUGMENT TIB ATT RM/LL SZ3/4X15 (Joint) ×1 IMPLANT
BAG ZIPLOCK 12X15 (MISCELLANEOUS) IMPLANT
BENZOIN TINCTURE PRP APPL 2/3 (GAUZE/BANDAGES/DRESSINGS) IMPLANT
BLADE SAG 18X100X1.27 (BLADE) ×2 IMPLANT
BLADE SURG SZ10 CARB STEEL (BLADE) ×4 IMPLANT
BNDG ELASTIC 6X10 VLCR STRL LF (GAUZE/BANDAGES/DRESSINGS) ×4 IMPLANT
BNDG ELASTIC 6X5.8 VLCR STR LF (GAUZE/BANDAGES/DRESSINGS) ×2 IMPLANT
BONE CEMENT GENTAMICIN (Cement) ×4 IMPLANT
CEMENT BONE GENTAMICIN 40 (Cement) ×2 IMPLANT
CLOTH BEACON ORANGE TIMEOUT ST (SAFETY) ×2 IMPLANT
COVER SURGICAL LIGHT HANDLE (MISCELLANEOUS) ×2 IMPLANT
COVER WAND RF STERILE (DRAPES) IMPLANT
CUFF TOURN SGL QUICK 34 (TOURNIQUET CUFF) ×1
CUFF TRNQT CYL 34X4.125X (TOURNIQUET CUFF) ×1 IMPLANT
DECANTER SPIKE VIAL GLASS SM (MISCELLANEOUS) IMPLANT
DRAPE U-SHAPE 47X51 STRL (DRAPES) ×2 IMPLANT
DRSG PAD ABDOMINAL 8X10 ST (GAUZE/BANDAGES/DRESSINGS) ×4 IMPLANT
DURAPREP 26ML APPLICATOR (WOUND CARE) ×2 IMPLANT
ELECT REM PT RETURN 15FT ADLT (MISCELLANEOUS) ×2 IMPLANT
EVACUATOR 1/8 PVC DRAIN (DRAIN) IMPLANT
GAUZE SPONGE 4X4 12PLY STRL (GAUZE/BANDAGES/DRESSINGS) ×2 IMPLANT
GAUZE XEROFORM 5X9 LF (GAUZE/BANDAGES/DRESSINGS) ×2 IMPLANT
GLOVE BIO SURGEON STRL SZ7.5 (GLOVE) ×2 IMPLANT
GLOVE BIOGEL PI IND STRL 8 (GLOVE) ×2 IMPLANT
GLOVE BIOGEL PI INDICATOR 8 (GLOVE) ×2
GLOVE ECLIPSE 8.0 STRL XLNG CF (GLOVE) ×2 IMPLANT
GOWN STRL REUS W/TWL XL LVL3 (GOWN DISPOSABLE) ×4 IMPLANT
HANDPIECE INTERPULSE COAX TIP (DISPOSABLE) ×1
HOLDER FOLEY CATH W/STRAP (MISCELLANEOUS) IMPLANT
IMMOBILIZER KNEE 20 (SOFTGOODS) ×2
IMMOBILIZER KNEE 20 THIGH 36 (SOFTGOODS) ×1 IMPLANT
INSERT TIB CMT ATTUNE RP SZ3 (Knees) ×2 IMPLANT
INSERT TIBIAL KNEE SZ 5 12MM (Insert) ×1 IMPLANT
KIT TURNOVER KIT A (KITS) IMPLANT
NDL SAFETY ECLIPSE 18X1.5 (NEEDLE) IMPLANT
NEEDLE HYPO 18GX1.5 SHARP (NEEDLE)
PACK TOTAL KNEE CUSTOM (KITS) ×2 IMPLANT
PADDING CAST COTTON 6X4 STRL (CAST SUPPLIES) ×4 IMPLANT
PENCIL SMOKE EVACUATOR (MISCELLANEOUS) IMPLANT
PIN FIX SIGMA LCS THRD HI (PIN) ×2 IMPLANT
PROTECTOR NERVE ULNAR (MISCELLANEOUS) ×2 IMPLANT
SET HNDPC FAN SPRY TIP SCT (DISPOSABLE) ×1 IMPLANT
SET PAD KNEE POSITIONER (MISCELLANEOUS) ×2 IMPLANT
SPONGE LAP 18X18 RF (DISPOSABLE) IMPLANT
STAPLER VISISTAT 35W (STAPLE) IMPLANT
STEM REV CEMENTED 14X50MM (Stem) ×2 IMPLANT
STRIP CLOSURE SKIN 1/2X4 (GAUZE/BANDAGES/DRESSINGS) IMPLANT
SUT MNCRL AB 4-0 PS2 18 (SUTURE) IMPLANT
SUT VIC AB 0 CT1 36 (SUTURE) ×2 IMPLANT
SUT VIC AB 1 CT1 36 (SUTURE) ×4 IMPLANT
SUT VIC AB 2-0 CT1 27 (SUTURE) ×2
SUT VIC AB 2-0 CT1 TAPERPNT 27 (SUTURE) ×2 IMPLANT
SWAB COLLECTION DEVICE MRSA (MISCELLANEOUS) IMPLANT
SWAB CULTURE ESWAB REG 1ML (MISCELLANEOUS) IMPLANT
SYR 3ML LL SCALE MARK (SYRINGE) IMPLANT
TIBIAL INSERT KNEE SZ 5 12MM (Insert) ×2 IMPLANT
TOWER CARTRIDGE SMART MIX (DISPOSABLE) IMPLANT
TRAY FOLEY MTR SLVR 16FR STAT (SET/KITS/TRAYS/PACK) ×2 IMPLANT
TUBE KAMVAC SUCTION (TUBING) IMPLANT
WATER STERILE IRR 1000ML POUR (IV SOLUTION) ×2 IMPLANT
WRAP KNEE MAXI GEL POST OP (GAUZE/BANDAGES/DRESSINGS) ×2 IMPLANT

## 2020-05-07 NOTE — Anesthesia Postprocedure Evaluation (Signed)
Anesthesia Post Note  Patient: Vanessa Christensen  Procedure(s) Performed: RIGHT TOTAL KNEE REVISION ARTHROPLASTY (Right Knee)     Patient location during evaluation: PACU Anesthesia Type: Spinal Level of consciousness: oriented and awake and alert Pain management: pain level controlled Vital Signs Assessment: post-procedure vital signs reviewed and stable Respiratory status: spontaneous breathing, respiratory function stable and patient connected to nasal cannula oxygen Cardiovascular status: blood pressure returned to baseline and stable Postop Assessment: no headache, no backache and no apparent nausea or vomiting Anesthetic complications: no   No complications documented.  Last Vitals:  Vitals:   05/07/20 1800 05/07/20 1820  BP: (!) 143/67 (!) 145/75  Pulse: 68 74  Resp: 16 16  Temp:  37 C  SpO2: 97% 99%    Last Pain:  Vitals:   05/07/20 1907  TempSrc:   PainSc: 7                  Blakeley Margraf COKER

## 2020-05-07 NOTE — Anesthesia Procedure Notes (Signed)
Procedure Name: MAC Date/Time: 05/07/2020 1:33 PM Performed by: Maxwell Caul, CRNA Pre-anesthesia Checklist: Patient identified, Emergency Drugs available, Suction available and Patient being monitored Oxygen Delivery Method: Simple face mask

## 2020-05-07 NOTE — Interval H&P Note (Signed)
History and Physical Interval Note: The patient understands she is here today due to a failed right total knee arthroplasty.  Our plan is to revise the components as necessary depending on her interoperative findings.  There has been no acute change in her medical status.  See recent H&P.  The risk and benefits of surgery have been explained in detail.  05/07/2020 12:09 PM  Vanessa Christensen  has presented today for surgery, with the diagnosis of Failed Right Total Knee.  The various methods of treatment have been discussed with the patient and family. After consideration of risks, benefits and other options for treatment, the patient has consented to  Procedure(s): RIGHT TOTAL KNEE REVISION ARTHROPLASTY (Right) as a surgical intervention.  The patient's history has been reviewed, patient examined, no change in status, stable for surgery.  I have reviewed the patient's chart and labs.  Questions were answered to the patient's satisfaction.     Mcarthur Rossetti

## 2020-05-07 NOTE — Anesthesia Preprocedure Evaluation (Signed)
Anesthesia Evaluation  Patient identified by MRN, date of birth, ID band Patient awake    Reviewed: Allergy & Precautions, NPO status , Patient's Chart, lab work & pertinent test results  Airway Mallampati: II  TM Distance: >3 FB Neck ROM: Full    Dental  (+) Teeth Intact, Dental Advisory Given   Pulmonary former smoker,    breath sounds clear to auscultation       Cardiovascular  Rhythm:Regular Rate:Normal     Neuro/Psych    GI/Hepatic   Endo/Other    Renal/GU      Musculoskeletal   Abdominal   Peds  Hematology   Anesthesia Other Findings   Reproductive/Obstetrics                             Anesthesia Physical Anesthesia Plan  ASA: III  Anesthesia Plan: Spinal and Regional   Post-op Pain Management:    Induction: Intravenous  PONV Risk Score and Plan: Ondansetron and Dexamethasone  Airway Management Planned: Natural Airway and Simple Face Mask  Additional Equipment:   Intra-op Plan:   Post-operative Plan:   Informed Consent: I have reviewed the patients History and Physical, chart, labs and discussed the procedure including the risks, benefits and alternatives for the proposed anesthesia with the patient or authorized representative who has indicated his/her understanding and acceptance.     Dental advisory given  Plan Discussed with: CRNA and Anesthesiologist  Anesthesia Plan Comments:         Anesthesia Quick Evaluation

## 2020-05-07 NOTE — Anesthesia Procedure Notes (Signed)
Spinal  Patient location during procedure: OR End time: 05/07/2020 1:40 PM Staffing Performed: resident/CRNA  Resident/CRNA: Maxwell Caul, CRNA Preanesthetic Checklist Completed: patient identified, IV checked, site marked, risks and benefits discussed, surgical consent, monitors and equipment checked, pre-op evaluation and timeout performed Spinal Block Patient position: sitting Prep: DuraPrep Patient monitoring: heart rate, cardiac monitor, continuous pulse ox and blood pressure Approach: midline Location: L3-4 Injection technique: single-shot Needle Needle type: Pencan  Needle gauge: 24 G Needle length: 10 cm Assessment Sensory level: T4 Additional Notes IV functioning, monitors applied to pt. Expiration date of kit checked and confirmed to be in date. Sterile prep and drape, hand hygiene and sterile gloved used. Pt was positioned and spine was prepped in sterile fashion. Skin was anesthetized with lidocaine. Free flow of clear CSF obtained prior to injecting local anesthetic into CSF x 1 attempt. Spinal needle aspirated freely following injection. Needle was carefully withdrawn, and pt tolerated procedure well. Loss of motor and sensory on exam post injection. Dr Valma Cava at bedside during entire placement.

## 2020-05-07 NOTE — Progress Notes (Signed)
AssistedDr. Linna Caprice with right, ultrasound guided, adductor canal block. Side rails up, monitors on throughout procedure. See vital signs in flow sheet. Tolerated Procedure well.

## 2020-05-07 NOTE — Brief Op Note (Signed)
05/07/2020  3:48 PM  PATIENT:  [name]  69 y.o. female  PRE-OPERATIVE DIAGNOSIS:  Failed Right Total Knee  POST-OPERATIVE DIAGNOSIS:  Failed Right Total Knee  PROCEDURE:  Procedure(s): RIGHT TOTAL KNEE REVISION ARTHROPLASTY (Right)  SURGEON:  Surgeon(s) and Role:    Mcarthur Rossetti, MD - Primary  PHYSICIAN ASSISTANT:  Benita Stabile, PA-C  ANESTHESIA:   local, regional and spinal  EBL:  50 mL   COUNTS:  YES  TOURNIQUET:   Total Tourniquet Time Documented: Thigh (Right) - 80 minutes Total: Thigh (Right) - 80 minutes   DICTATION: .Other Dictation: Dictation Number [id number]  PLAN OF CARE: Admit for overnight observation  PATIENT DISPOSITION:  PACU - hemodynamically stable.   Delay start of Pharmacological VTE agent (>24hrs) due to surgical blood loss or risk of bleeding: no

## 2020-05-07 NOTE — Transfer of Care (Signed)
Immediate Anesthesia Transfer of Care Note  Patient: Vanessa Christensen  Procedure(s) Performed: RIGHT TOTAL KNEE REVISION ARTHROPLASTY (Right Knee)  Patient Location: PACU  Anesthesia Type:General  Level of Consciousness: awake, alert  and oriented  Airway & Oxygen Therapy: Patient Spontanous Breathing and Patient connected to face mask oxygen  Post-op Assessment: Report given to RN, Post -op Vital signs reviewed and stable and Patient moving all extremities X 4  Post vital signs: Reviewed and stable  Last Vitals:  Vitals Value Taken Time  BP    Temp    Pulse    Resp    SpO2      Last Pain:  Vitals:   05/07/20 1241  TempSrc:   PainSc: 0-No pain      Patients Stated Pain Goal: 4 (44/83/01 5996)  Complications: No complications documented.

## 2020-05-08 ENCOUNTER — Encounter (HOSPITAL_COMMUNITY): Payer: Self-pay | Admitting: Orthopaedic Surgery

## 2020-05-08 LAB — CBC
HCT: 35.8 % — ABNORMAL LOW (ref 36.0–46.0)
Hemoglobin: 11.3 g/dL — ABNORMAL LOW (ref 12.0–15.0)
MCH: 29.5 pg (ref 26.0–34.0)
MCHC: 31.6 g/dL (ref 30.0–36.0)
MCV: 93.5 fL (ref 80.0–100.0)
Platelets: 161 10*3/uL (ref 150–400)
RBC: 3.83 MIL/uL — ABNORMAL LOW (ref 3.87–5.11)
RDW: 13.5 % (ref 11.5–15.5)
WBC: 11.4 10*3/uL — ABNORMAL HIGH (ref 4.0–10.5)
nRBC: 0 % (ref 0.0–0.2)

## 2020-05-08 LAB — BASIC METABOLIC PANEL
Anion gap: 12 (ref 5–15)
BUN: 12 mg/dL (ref 8–23)
CO2: 21 mmol/L — ABNORMAL LOW (ref 22–32)
Calcium: 8.9 mg/dL (ref 8.9–10.3)
Chloride: 101 mmol/L (ref 98–111)
Creatinine, Ser: 0.51 mg/dL (ref 0.44–1.00)
GFR, Estimated: >60 mL/min (ref >60)
Glucose, Bld: 138 mg/dL — ABNORMAL HIGH (ref 70–99)
Potassium: 3.5 mmol/L (ref 3.5–5.1)
Sodium: 134 mmol/L — ABNORMAL LOW (ref 135–145)

## 2020-05-08 NOTE — Progress Notes (Signed)
Physical Therapy Treatment Patient Details Name: Vanessa Christensen MRN: [id number] DOB: 1951/04/15 Today's Date: 05/08/2020    History of Present Illness 69 yo female s/p R TK revision 05/07/20. Hx of R TKA, obesity, vertigo    PT Comments    Progressing slowly with mobility. Pt reports feeling "loopy." Also noted pt to be sweating quite a bit during session. Once back in room and seated, assessed vitals: BP 133/65, HR 125 bpm, O2 96% on RA. Will continue to progress activity as tolerated.    Follow Up Recommendations  Follow surgeon's recommendation for DC plan and follow-up therapies;Supervision/Assistance - 24 hour (plan is for HHPT)     Equipment Recommendations  None recommended by PT    Recommendations for Other Services       Precautions / Restrictions Precautions Precautions: Fall Required Braces or Orthoses: Knee Immobilizer - Right Knee Immobilizer - Right: Discontinue once straight leg raise with < 10 degree lag Restrictions Weight Bearing Restrictions: No RLE Weight Bearing: Weight bearing as tolerated    Mobility  Bed Mobility Overal bed mobility: Needs Assistance Bed Mobility: Supine to Sit     Supine to sit: Min assist;HOB elevated     General bed mobility comments: Assist for R LE and trunk. Increased time and effort for pt. Cues for safety, technique. Pt relied moderately on bedrail.  Transfers Overall transfer level: Needs assistance Equipment used: Rolling walker (2 wheeled) Transfers: Sit to/from Stand Sit to Stand: Min assist;From elevated surface         General transfer comment: Assist to power up, stabilize, control descent. Cues for safety, technique, hand/LE placement.  Ambulation/Gait Ambulation/Gait assistance: Min assist Gait Distance (Feet): 30 Feet Assistive device: Rolling walker (2 wheeled) Gait Pattern/deviations: Step-to pattern;Trunk flexed;Antalgic     General Gait Details: Cues for safety, technique, sequence, step length,  RW proximity. Assist to stabilize throughout distance. Distance limited by pain, fatigue.   Stairs             Wheelchair Mobility    Modified Rankin (Stroke Patients Only)       Balance Overall balance assessment: Needs assistance         Standing balance support: Bilateral upper extremity supported Standing balance-Leahy Scale: Poor                              Cognition Arousal/Alertness: Awake/alert Behavior During Therapy: WFL for tasks assessed/performed Overall Cognitive Status: Within Functional Limits for tasks assessed                                        Exercises Total Joint Exercises Ankle Circles/Pumps: AROM;Both;10 reps Quad Sets: AROM;Both;10 reps Heel Slides: AAROM;Right;10 reps Hip ABduction/ADduction: AAROM;Right;10 reps Straight Leg Raises: AAROM;Right;10 reps Goniometric ROM: ~10-40 degrees (limited by pain)    General Comments        Pertinent Vitals/Pain Pain Assessment: 0-10 Pain Score: 5  Pain Location: R knee Pain Descriptors / Indicators: Aching;Sore Pain Intervention(s): Monitored during session;Limited activity within patient's tolerance;Repositioned    Home Living Family/patient expects to be discharged to:: Private residence Living Arrangements: Spouse/significant other Available Help at Discharge: Family Type of Home: House Home Access: Stairs to enter Entrance Stairs-Rails: None Home Layout: One level Home Equipment: Environmental consultant - 2 wheels;Cane - single point;Bedside commode      Prior Function Level of Independence: Independent  with assistive device(s)      Comments: using cane   PT Goals (current goals can now be found in the care plan section) Acute Rehab PT Goals Patient Stated Goal: less pain. regain independence PT Goal Formulation: With patient Time For Goal Achievement: 05/22/20 Potential to Achieve Goals: Good Progress towards PT goals: Progressing toward goals     Frequency    7X/week      PT Plan Current plan remains appropriate    Co-evaluation              AM-PAC PT "6 Clicks" Mobility   Outcome Measure  Help needed turning from your back to your side while in a flat bed without using bedrails?: A Little Help needed moving from lying on your back to sitting on the side of a flat bed without using bedrails?: A Little Help needed moving to and from a bed to a chair (including a wheelchair)?: A Little Help needed standing up from a chair using your arms (e.g., wheelchair or bedside chair)?: A Little Help needed to walk in hospital room?: A Little Help needed climbing 3-5 steps with a railing? : A Lot 6 Click Score: 17    End of Session Equipment Utilized During Treatment: Gait belt;Right knee immobilizer Activity Tolerance: Patient limited by fatigue;Patient limited by pain Patient left: in chair;with call bell/phone within reach;with chair alarm set   PT Visit Diagnosis: Other abnormalities of gait and mobility (R26.89);Pain Pain - Right/Left: Right Pain - part of body: Knee     Time: 1610-9604 PT Time Calculation (min) (ACUTE ONLY): 27 min  Charges:  $Gait Training: 23-37 mins                         Doreatha Massed, PT Acute Rehabilitation  Office: [phone] Pager: [phone]

## 2020-05-08 NOTE — Plan of Care (Signed)
Plan of care discussed.   

## 2020-05-08 NOTE — Progress Notes (Signed)
     Subjective: 1 Day Post-Op Procedure(s) (LRB): RIGHT TOTAL KNEE REVISION ARTHROPLASTY (Right) Awake, alert and oriented x 4, severe pain, receiving narcotics at this time. Right leg Nv normal. Dressing intact.  Patient reports pain as marked.    Objective:   VITALS:  Temp:  [98 F (36.7 C)-101.2 F (38.4 C)] 100.1 F (37.8 C) (12/04 0412) Pulse Rate:  [65-98] 98 (12/04 0412) Resp:  [10-19] 19 (12/04 0412) BP: (125-182)/(60-86) 182/85 (12/04 0412) SpO2:  [97 %-100 %] 97 % (12/04 0412) Weight:  [91.8 kg] 91.8 kg (12/03 1059)  Neurologically intact ABD soft Neurovascular intact Sensation intact distally Intact pulses distally Dorsiflexion/Plantar flexion intact Incision: dressing C/D/I and no drainage Compartment soft   LABS Recent Labs    05/05/20 1442 05/08/20 0430  HGB 11.7* 11.3*  WBC 4.0 11.4*  PLT 179 161   Recent Labs    05/08/20 0430  NA 134*  K 3.5  CL 101  CO2 21*  BUN 12  CREATININE 0.51  GLUCOSE 138*   No results for input(s): LABPT, INR in the last 72 hours.   Assessment/Plan: 1 Day Post-Op Procedure(s) (LRB): RIGHT TOTAL KNEE REVISION ARTHROPLASTY (Right)  Advance diet Up with therapy  Pain control, progressive PT, unable to stand and walk as yet.   Basil Dess 05/08/2020, 9:54 AMPatient ID: Vanessa Christensen, female   DOB: 04-25-1951, 69 y.o.   MRN: [id number]

## 2020-05-08 NOTE — Evaluation (Signed)
Physical Therapy Evaluation Patient Details Name: Vanessa Christensen MRN: [id number] DOB: [birth year] Today's Date: 05/08/2020   History of Present Illness  69 yo female s/p R TK revision 05/07/20. Hx of R TKA, obesity, vertigo  Clinical Impression  On eval, pt was Min assist for mobility. She walked ~12 feet with a RW. Distance limited by pain, fatigue. Will continue to follow and progress activity as tolerated.     Follow Up Recommendations Follow surgeon's recommendation for DC plan and follow-up therapies;Supervision/Assistance - 24 hour (plan is for HHPT)    Equipment Recommendations  None recommended by PT    Recommendations for Other Services       Precautions / Restrictions Precautions Precautions: Fall Required Braces or Orthoses: Knee Immobilizer - Right Knee Immobilizer - Right: Discontinue once straight leg raise with < 10 degree lag Restrictions Weight Bearing Restrictions: No RLE Weight Bearing: Weight bearing as tolerated      Mobility  Bed Mobility Overal bed mobility: Needs Assistance Bed Mobility: Supine to Sit     Supine to sit: Min assist;HOB elevated     General bed mobility comments: Assist for R LE and trunk. Increased time and effort for pt. Cues for safety, technique. Pt relied moderately on bedrail.    Transfers Overall transfer level: Needs assistance Equipment used: Rolling walker (2 wheeled) Transfers: Sit to/from Stand Sit to Stand: Min assist;From elevated surface         General transfer comment: Assist to power up, stabilize, control descent. Cues for safety, technique, hand/LE placement.  Ambulation/Gait Ambulation/Gait assistance: Min assist Gait Distance (Feet): 12 Feet Assistive device: Rolling walker (2 wheeled) Gait Pattern/deviations: Step-to pattern     General Gait Details: Cues for safety, technique, sequence, step length, RW proximity. Assist to stabilize throughout distance. Followed with recliner. Distance limited by  pain, fatigue.  Stairs            Wheelchair Mobility    Modified Rankin (Stroke Patients Only)       Balance Overall balance assessment: Needs assistance         Standing balance support: Bilateral upper extremity supported Standing balance-Leahy Scale: Poor                               Pertinent Vitals/Pain Pain Assessment: 0-10 Pain Score: 7  Pain Location: R knee Pain Descriptors / Indicators: Aching;Sore Pain Intervention(s): Monitored during session;Limited activity within patient's tolerance;Repositioned;Ice applied    Home Living Family/patient expects to be discharged to:: Private residence Living Arrangements: Spouse/significant other Available Help at Discharge: Family Type of Home: House Home Access: Stairs to enter Entrance Stairs-Rails: None Entrance Stairs-Number of Steps: 1 or 2 Home Layout: One level Home Equipment: Environmental consultant - 2 wheels;Cane - single point;Bedside commode      Prior Function Level of Independence: Independent with assistive device(s)         Comments: using cane     Hand Dominance        Extremity/Trunk Assessment   Upper Extremity Assessment Upper Extremity Assessment: Generalized weakness    Lower Extremity Assessment Lower Extremity Assessment: Generalized weakness    Cervical / Trunk Assessment Cervical / Trunk Assessment: Normal  Communication   Communication: No difficulties  Cognition Arousal/Alertness: Awake/alert Behavior During Therapy: WFL for tasks assessed/performed Overall Cognitive Status: Within Functional Limits for tasks assessed  General Comments      Exercises Total Joint Exercises Ankle Circles/Pumps: AROM;Both;10 reps Quad Sets: AROM;Both;10 reps Heel Slides: AAROM;Right;10 reps Hip ABduction/ADduction: AAROM;Right;10 reps Straight Leg Raises: AAROM;Right;10 reps Goniometric ROM: ~10-40 degrees (limited by  pain)   Assessment/Plan    PT Assessment Patient needs continued PT services  PT Problem List Decreased strength;Decreased range of motion;Decreased mobility;Decreased activity tolerance;Decreased knowledge of use of DME;Decreased balance;Pain       PT Treatment Interventions DME instruction;Gait training;Therapeutic activities;Therapeutic exercise;Patient/family education;Balance training;Stair training;Functional mobility training    PT Goals (Current goals can be found in the Care Plan section)  Acute Rehab PT Goals Patient Stated Goal: less pain. regain independence PT Goal Formulation: With patient Time For Goal Achievement: 05/22/20 Potential to Achieve Goals: Good    Frequency 7X/week   Barriers to discharge        Co-evaluation               AM-PAC PT "6 Clicks" Mobility  Outcome Measure Help needed turning from your back to your side while in a flat bed without using bedrails?: A Little Help needed moving from lying on your back to sitting on the side of a flat bed without using bedrails?: A Little Help needed moving to and from a bed to a chair (including a wheelchair)?: A Little Help needed standing up from a chair using your arms (e.g., wheelchair or bedside chair)?: A Little Help needed to walk in hospital room?: A Little Help needed climbing 3-5 steps with a railing? : A Lot 6 Click Score: 17    End of Session Equipment Utilized During Treatment: Gait belt;Right knee immobilizer Activity Tolerance: Patient limited by fatigue;Patient limited by pain Patient left: in chair;with call bell/phone within reach;with chair alarm set   PT Visit Diagnosis: Other abnormalities of gait and mobility (R26.89);Pain Pain - Right/Left: Right Pain - part of body: Knee    Time: 1100-1133 PT Time Calculation (min) (ACUTE ONLY): 33 min   Charges:   PT Evaluation $PT Eval Low Complexity: 1 Low PT Treatments $Gait Training: 8-22 mins           Doreatha Massed,  PT Acute Rehabilitation  Office: [phone] Pager: [phone]

## 2020-05-08 NOTE — Plan of Care (Signed)
  Problem: Education: Goal: Knowledge of General Education information will improve Description: Including pain rating scale, medication(s)/side effects and non-pharmacologic comfort measures 05/08/2020 1809 by Keturah Shavers, RN Outcome: Progressing Problem: Clinical Measurements: Goal: Will remain free from infection 05/08/2020 1809 by Keturah Shavers, RN Outcome: Progressing

## 2020-05-08 NOTE — Op Note (Signed)
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  PROCEDURE: 1.  Revision of tibial component and polyethylene liner, right knee. 2.  Synovectomy with irrigation and debridement of right knee joint.  IMPLANTS:  DePuy Attune revision tibial tray size 3 with a 14 x 50 cemented stem with 15 mm medial and lateral wedge inserts and a 12 mm RP posterior-stabilized mobile bearing polyethylene insert.  SURGEON:  Lind Guest. Ninfa Linden, MD  ASSISTANT:  Erskine Emery, PA-C  ANESTHESIA: 1.  Right lower extremity adductor canal block. 2.  Spinal. 3.  Local with 0.25% Marcaine with epinephrine.  ANTIBIOTICS:  Two grams IV Ancef.  BLOOD LOSS:  100 mL.  TOURNIQUET TIME:  Less than an hour and a half.  COMPLICATIONS:  None.  INDICATIONS:  The patient is a 69 year old female who [place].  Over the last year, she has had worsening right knee pain and swelling.  She now lives in [place].  She was seen  and evaluated and found to have aseptic loosening of her right prosthetic knee tibial component, with a right knee arthroplasty tibial component.  This was confirmed on bone scan and plain films.  Her preoperative workup for infection was negative.  We  still did obtain intraoperative cultures.  Based on her continued pain and the evidence of loosening of the tibial tray, we recommended revision of her right total knee arthroplasty, with that a minimum revising the tibia and potentially revising the  whole knee depending on intraoperative  findings.  Also, she understands that this will be an excision arthroplasty with placement of an antibiotic spacer if there is evidence of infection.  We had a long and thorough discussion about the risks and  benefits of the surgery and she does wish to proceed.  DESCRIPTION OF PROCEDURE:  After informed consent was obtained, appropriate right knee was marked.  Anesthesia obtained an adductor canal block of the right lower extremity in the holding room.  She was then brought to the operating room and sat up on  the operating table.  Spinal anesthesia was then obtained.  She was then laid in supine position on the operating table.  Foley catheter was placed and nonsterile tourniquet was placed around her upper right thigh.  Her right thigh, knee, leg, ankle and  foot were prepped and draped with DuraPrep and sterile drapes including a sterile stockinette.  A timeout was called and she was identified correct patient, correct right knee.  We then used an Esmarch to wrap that leg and tourniquet was inflated to 300  mm of pressure.  We then made a direct midline incision over the patella and carried this proximally and distally.  We dissected down the knee joint and carried out a medial parapatellar arthrotomy and found significant synovitis and inflamed knee.   There was no gross purulence, but we still sent off cultures.  The findings of the synovium of her knee were to me indicative of aseptic loosening of the tibial tray.  We performed a significant synovectomy around the medial, lateral and superior aspects  of the knee, in the prepatellar area.  We then  irrigated the knee with normal saline solution using pulsatile lavage.  With the knee in a flexed position, I was able to easily remove her old polyethylene liner.  I then removed the tibial tray without  any difficulty at all.  It was obviously loose.  We then removed all cement mantle that was remaining over the tibia.  The patellar component was  assessed and found to be intact with no loosening and the femoral component was intact with no loosening.   We then irrigated the knee again with normal saline solution using pulsatile lavage.  At that point, we did give her antibiotics and decided to proceed with revision of the tibial component.  Using the DePuy Attune revision system, we were able to choose  a size 3 tibial tray for coverage.  We made a freshening cut of the tibia and then drilled for a 14 x 50 cemented stem.  We brought the knee joint down pretty low with our tibia cut, even though which is our freshening cut, we had to put 15 mm wedges  medially and laterally on our trial component of the tibia with the 14 x 50 stem.  We put the trial together and then we trialed up to a 12 mm polyethylene insert and we were pleased with stability of the knee.  Of note, preoperative on my exam, her knee  was also grossly unstable.  There was only a 5 mm thickness insert preoperative.  We then prepared our final revision tibial tray with size 3, which was DePuy Attune and we placed two 15 mm wedges medially and laterally and a 14 x 50 stem.  We mixed our  cement and then cemented the tibial component.  We also placed our 12 mm rotating platform posterior-stabilized insert.  With the tibial component insert in place, we removed cement debris from the knee and then held the knee compressed in an extended  position until the cement hardened.  Once it had hardened, we then irrigated the knee again with normal saline solution using pulsatile lavage.  We then let our tourniquet down.  Hemostasis obtained with electrocautery.  We then placed our Marcaine and  epinephrine mixture around the arthrotomy.  We then closed the arthrotomy with interrupted #1 Vicryl suture followed by 0 Vicryl to close the deep tissue and 2-0 Vicryl to close the subcutaneous tissue.  The skin was reapproximated with staples.  The  patient was taken to recovery room in stable condition  with all final counts being correct.  There were no complications noted.  Of note, Benita Stabile, PA-C, did assist during the entire case and assistance was crucial for facilitating all aspects of this  case.  HN/NUANCE  D:05/07/2020 T:05/08/2020 JOB:[id number]

## 2020-05-08 NOTE — Progress Notes (Signed)
Dr Ninfa Linden is notified of Pt's temperature. Pt is using IS. Monitoring closely.

## 2020-05-09 MED ORDER — OXYCODONE HCL 5 MG PO TABS
5.0000 mg | ORAL_TABLET | ORAL | 0 refills | Status: DC | PRN
Start: 2020-05-09 — End: 2020-06-22

## 2020-05-09 MED ORDER — ASPIRIN 325 MG PO TBEC
325.0000 mg | DELAYED_RELEASE_TABLET | Freq: Two times a day (BID) | ORAL | 0 refills | Status: DC
Start: 2020-05-09 — End: 2020-05-25

## 2020-05-09 MED ORDER — SENNA 8.6 MG PO TABS
1.0000 | ORAL_TABLET | Freq: Two times a day (BID) | ORAL | 0 refills | Status: DC
Start: 2020-05-09 — End: 2020-05-25

## 2020-05-09 NOTE — Progress Notes (Signed)
Physical Therapy Treatment Patient Details Name: Vanessa Christensen MRN: [id number] DOB: December 08, 1950 Today's Date: 05/09/2020    History of Present Illness 69 yo female s/p R TK revision 05/07/20. Hx of R TKA, obesity, vertigo    PT Comments    Progressing with mobility. Reviewed exercises, gait training, and stair training with pt and husband. No further questions/concerns from pt/husband. All education completed. Okay to d/c from PT standpoint.  Follow Up Recommendations  Follow surgeon's recommendation for DC plan and follow-up therapies;Supervision/Assistance - 24 hour (plan is for HHPT)     Equipment Recommendations  None recommended by PT    Recommendations for Other Services       Precautions / Restrictions Precautions Precautions: Fall Required Braces or Orthoses: Knee Immobilizer - Right Knee Immobilizer - Right: Discontinue once straight leg raise with < 10 degree lag Restrictions Weight Bearing Restrictions: No RLE Weight Bearing: Weight bearing as tolerated    Mobility  Bed Mobility Overal bed mobility: Needs Assistance Bed Mobility: Supine to Sit     Supine to sit: Min assist;HOB elevated     General bed mobility comments: Assist for R LE and trunk. Increased time and effort for pt. Cues for safety, technique. Pt relied moderately on bedrail.  Transfers Overall transfer level: Needs assistance Equipment used: Rolling walker (2 wheeled) Transfers: Sit to/from Stand Sit to Stand: Min assist;From elevated surface         General transfer comment: Assist to power up, stabilize, control descent. Cues for safety, technique, hand/LE placement.  Ambulation/Gait Ambulation/Gait assistance: Min assist Gait Distance (Feet): 75 Feet Assistive device: Rolling walker (2 wheeled) Gait Pattern/deviations: Step-to pattern;Trunk flexed;Antalgic     General Gait Details: Cues for safety, technique, sequence, step length, RW proximity. Assist to stabilize throughout  distance. Distance limited by pain, fatigue.   Stairs Stairs: Yes Stairs assistance: Min assist Stair Management: Step to pattern;Backwards;With walker Number of Stairs: 2 General stair comments: VCs safety, technique, sequence. Husband present to observe and practice stabilizing walker while ascending and descending.   Wheelchair Mobility    Modified Rankin (Stroke Patients Only)       Balance Overall balance assessment: Needs assistance         Standing balance support: Bilateral upper extremity supported Standing balance-Leahy Scale: Poor                              Cognition Arousal/Alertness: Awake/alert Behavior During Therapy: WFL for tasks assessed/performed Overall Cognitive Status: Within Functional Limits for tasks assessed                                        Exercises Total Joint Exercises Ankle Circles/Pumps: AROM;Both;10 reps Quad Sets: AROM;Both;10 reps Heel Slides: AAROM;Right;10 reps Hip ABduction/ADduction: AAROM;Right;10 reps Straight Leg Raises: AAROM;Right;10 reps Goniometric ROM: ~10-50 degrees (limited by pain)    General Comments        Pertinent Vitals/Pain Pain Assessment: 0-10 Pain Score: 5  Pain Location: R knee Pain Descriptors / Indicators: Aching;Sore Pain Intervention(s): Monitored during session    Home Living                      Prior Function            PT Goals (current goals can now be found in the care plan section) Progress towards PT  goals: Progressing toward goals    Frequency    7X/week      PT Plan Current plan remains appropriate    Co-evaluation              AM-PAC PT "6 Clicks" Mobility   Outcome Measure  Help needed turning from your back to your side while in a flat bed without using bedrails?: A Little Help needed moving from lying on your back to sitting on the side of a flat bed without using bedrails?: A Little Help needed moving to and  from a bed to a chair (including a wheelchair)?: A Little Help needed standing up from a chair using your arms (e.g., wheelchair or bedside chair)?: A Little Help needed to walk in hospital room?: A Little Help needed climbing 3-5 steps with a railing? : A Little 6 Click Score: 18    End of Session Equipment Utilized During Treatment: Gait belt;Right knee immobilizer Activity Tolerance: Patient limited by fatigue;Patient limited by pain Patient left: in chair;with call bell/phone within reach;with chair alarm set   PT Visit Diagnosis: Other abnormalities of gait and mobility (R26.89);Pain Pain - Right/Left: Right Pain - part of body: Knee     Time: 9432-7614 PT Time Calculation (min) (ACUTE ONLY): 43 min  Charges:  $Gait Training: 23-37 mins $Therapeutic Exercise: 8-22 mins                         Doreatha Massed, PT Acute Rehabilitation  Office: [phone] Pager: [phone]

## 2020-05-09 NOTE — Discharge Instructions (Signed)

## 2020-05-09 NOTE — Progress Notes (Signed)
     Subjective: 2 Days Post-Op Procedure(s) (LRB): RIGHT TOTAL KNEE REVISION ARTHROPLASTY (Right) Awake, alert and oriented x 4, dressing changed today. PT today. Less pain. Wants to go home today.  Patient reports pain as moderate.    Objective:   VITALS:  Temp:  [98 F (36.7 C)-101.1 F (38.4 C)] 99.2 F (37.3 C) (12/05 0951) Pulse Rate:  [92-109] 104 (12/05 0951) Resp:  [16-18] 18 (12/05 0951) BP: (126-166)/(63-77) 159/75 (12/05 0951) SpO2:  [94 %-97 %] 97 % (12/05 0951)  Neurologically intact ABD soft Neurovascular intact Sensation intact distally Intact pulses distally Dorsiflexion/Plantar flexion intact Incision: scant drainage and New Ag cell dressing applied. painted with betadien. No cellulitis present Compartment soft   LABS Recent Labs    05/08/20 0430  HGB 11.3*  WBC 11.4*  PLT 161   Recent Labs    05/08/20 0430  NA 134*  K 3.5  CL 101  CO2 21*  BUN 12  CREATININE 0.51  GLUCOSE 138*   No results for input(s): LABPT, INR in the last 72 hours.   Assessment/Plan: 2 Days Post-Op Procedure(s) (LRB): RIGHT TOTAL KNEE REVISION ARTHROPLASTY (Right)  Advance diet Up with therapy D/C IV fluids Discharge home with home health  Wants to see Mr. Romona Curls with [facility] after a couple of [facility] PT visits, I told her this is fine, no one with be upset if she does out patient PT, it is less costly.   Basil Dess 05/09/2020, 10:18 AMPatient ID: Vanessa Christensen, female   DOB: 16-Jan-1951, 69 y.o.   MRN: [id number]

## 2020-05-10 ENCOUNTER — Encounter (HOSPITAL_COMMUNITY): Payer: Self-pay | Admitting: Orthopaedic Surgery

## 2020-05-11 ENCOUNTER — Telehealth: Payer: Self-pay

## 2020-05-11 DIAGNOSIS — Z96651 Presence of right artificial knee joint: Secondary | ICD-10-CM | POA: Diagnosis not present

## 2020-05-11 DIAGNOSIS — R42 Dizziness and giddiness: Secondary | ICD-10-CM | POA: Diagnosis not present

## 2020-05-11 DIAGNOSIS — Z6837 Body mass index (BMI) 37.0-37.9, adult: Secondary | ICD-10-CM | POA: Diagnosis not present

## 2020-05-11 DIAGNOSIS — Z853 Personal history of malignant neoplasm of breast: Secondary | ICD-10-CM | POA: Diagnosis not present

## 2020-05-11 DIAGNOSIS — E039 Hypothyroidism, unspecified: Secondary | ICD-10-CM | POA: Diagnosis not present

## 2020-05-11 DIAGNOSIS — M19079 Primary osteoarthritis, unspecified ankle and foot: Secondary | ICD-10-CM | POA: Diagnosis not present

## 2020-05-11 DIAGNOSIS — E559 Vitamin D deficiency, unspecified: Secondary | ICD-10-CM | POA: Diagnosis not present

## 2020-05-11 DIAGNOSIS — T84032D Mechanical loosening of internal right knee prosthetic joint, subsequent encounter: Secondary | ICD-10-CM | POA: Diagnosis not present

## 2020-05-11 DIAGNOSIS — F419 Anxiety disorder, unspecified: Secondary | ICD-10-CM | POA: Diagnosis not present

## 2020-05-11 DIAGNOSIS — M19049 Primary osteoarthritis, unspecified hand: Secondary | ICD-10-CM | POA: Diagnosis not present

## 2020-05-11 DIAGNOSIS — G4733 Obstructive sleep apnea (adult) (pediatric): Secondary | ICD-10-CM | POA: Diagnosis not present

## 2020-05-11 DIAGNOSIS — F32A Depression, unspecified: Secondary | ICD-10-CM | POA: Diagnosis not present

## 2020-05-11 DIAGNOSIS — N3281 Overactive bladder: Secondary | ICD-10-CM | POA: Diagnosis not present

## 2020-05-11 DIAGNOSIS — K5730 Diverticulosis of large intestine without perforation or abscess without bleeding: Secondary | ICD-10-CM | POA: Diagnosis not present

## 2020-05-11 DIAGNOSIS — Z87891 Personal history of nicotine dependence: Secondary | ICD-10-CM | POA: Diagnosis not present

## 2020-05-11 NOTE — Telephone Encounter (Signed)
Ok to give verbal ok?

## 2020-05-11 NOTE — Telephone Encounter (Signed)
Yes that is fine

## 2020-05-11 NOTE — Discharge Summary (Signed)
Patient ID: Vanessa Christensen MRN: [id number] DOB/AGE: 05-Oct-1950 69 y.o.  Admit date: 05/07/2020 Discharge date: 05/11/2020  Admission Diagnoses:  Principal Problem:   Failed total knee, right, subsequent encounter Active Problems:   Status post revision of total replacement of right knee   Discharge Diagnoses:  Status post right total knee revision arthroplasty   Past Medical History:  Diagnosis Date  . Anemia    hx of  . Anxiety    pt denies  . Arthritis    hands and feet  . Cancer ([place]) 10/04/2006   breast  . Cataracts, both eyes   . Depression   . Depression   . Diverticulosis of colon   . Glaucoma   . Heart murmur    No work up done 5 years ago  . Joint pain   . Knee pain   . Obesity   . OSA on CPAP   . Osteoarthritis   . Sleep apnea    no CPAP- no longer needed d/t weight loss  . Thyroid activity decreased     Surgeries: Procedure(s): RIGHT TOTAL KNEE REVISION ARTHROPLASTY on 05/07/2020   Consultants:   Discharged Condition: Improved  Hospital Course: Vanessa Christensen is an 69 y.o. female who was admitted 05/07/2020 for operative treatment ofFailed total knee, right, subsequent encounter. Patient has severe unremitting pain that affects sleep, daily activities, and work/hobbies. After pre-op clearance the patient was taken to the operating room on 05/07/2020 and underwent  Procedure(s): RIGHT TOTAL KNEE REVISION ARTHROPLASTY.    Patient was given perioperative antibiotics:  Anti-infectives (From admission, onward)   Start     Dose/Rate Route Frequency Ordered Stop   05/07/20 2000  ceFAZolin (ANCEF) IVPB 1 g/50 mL premix        1 g 100 mL/hr over 30 Minutes Intravenous Every 6 hours 05/07/20 1825 05/08/20 0220   05/07/20 1348  ceFAZolin (ANCEF) 2-4 GM/100ML-% IVPB       Note to Pharmacy: Randa Evens  : cabinet override      05/07/20 1348 05/08/20 0159       Patient was given sequential compression devices, early ambulation, and chemoprophylaxis to  prevent DVT.  Patient benefited maximally from hospital stay and there were no complications.    Recent vital signs: No data found.   Recent laboratory studies: No results for input(s): WBC, HGB, HCT, PLT, NA, K, CL, CO2, BUN, CREATININE, GLUCOSE, INR, CALCIUM in the last 72 hours.  Invalid input(s): PT, 2   Discharge Medications:   Allergies as of 05/09/2020      Reactions   Neulasta [pegfilgrastim] Other (See Comments)   Raised lumps on legs, arms - had to be excised =  Happened every time pt received Neulasta.      Medication List    STOP taking these medications   ibuprofen 200 MG tablet Commonly known as: ADVIL   meloxicam 15 MG tablet Commonly known as: MOBIC   traMADol 50 MG tablet Commonly known as: ULTRAM     TAKE these medications   acetaminophen 500 MG tablet Commonly known as: TYLENOL Take 1,000 mg by mouth every 6 (six) hours as needed (pain).   aspirin 325 MG EC tablet Take 1 tablet (325 mg total) by mouth 2 (two) times daily after a meal. What changed:   medication strength  how much to take  when to take this  additional instructions   Azopt 1 % ophthalmic suspension Generic drug: brinzolamide Place 1 drop into both eyes in the morning and  at bedtime.   Fish Oil Adult Gummies 113.5 MG Chew Chew 2 tablets by mouth daily at 2 PM.   FLUoxetine 20 MG capsule Commonly known as: PROZAC TAKE 1 CAPSULE BY MOUTH EVERY DAY What changed: how much to take   levothyroxine 75 MCG tablet Commonly known as: SYNTHROID Take 1 tablet (75 mcg total) by mouth daily.   meclizine 25 MG tablet Commonly known as: ANTIVERT Take 25 mg by mouth 3 (three) times daily as needed for dizziness.   oxyCODONE 5 MG immediate release tablet Commonly known as: Oxy IR/ROXICODONE Take 1-2 tablets (5-10 mg total) by mouth every 4 (four) hours as needed for moderate pain (pain score 4-6).   PREVAGEN PO Take 1 tablet by mouth daily.   psyllium 58.6 % powder Commonly  known as: METAMUCIL Take 1 packet by mouth daily.   senna 8.6 MG Tabs tablet Commonly known as: SENOKOT Take 1 tablet (8.6 mg total) by mouth 2 (two) times daily.   SUPER B COMPLEX PO Take 1 tablet by mouth daily after supper.   tolterodine 4 MG 24 hr capsule Commonly known as: DETROL LA TAKE 1 CAPSULE BY MOUTH EVERY DAY What changed: how much to take   TURMERIC PO Take 1,500 mg by mouth daily after supper.   VITAMIN C PO Take 1 tablet by mouth daily.   VITAMIN D (CHOLECALCIFEROL) PO Take 25 mcg by mouth daily.   Vitamin D (Ergocalciferol) 1.25 MG (50000 UNIT) Caps capsule Commonly known as: DRISDOL Take 1 capsule (50,000 Units total) by mouth every 7 (seven) days. What changed:   when to take this  additional instructions       Diagnostic Studies: DG Knee Right Port  Result Date: 05/07/2020 CLINICAL DATA:  Right knee arthroplasty revision EXAM: PORTABLE RIGHT KNEE - 1-2 VIEW COMPARISON:  11/27/2019 FINDINGS: Frontal and lateral views of the right knee demonstrate revision of the tibial component of the right knee arthroplasty, in the expected position without signs of acute complication. Femoral and patellar components are stable. Postsurgical changes are seen in the soft tissues. IMPRESSION: 1. Revision of right knee arthroplasty as above. No evidence of complication. Electronically Signed   By: Randa Ngo M.D.   On: 05/07/2020 20:15    Disposition: Discharge disposition: 01-Home or Self Care       Discharge Instructions    Call MD / Call 911   Complete by: As directed    If you experience chest pain or shortness of breath, CALL 911 and be transported to the hospital emergency room.  If you develope a fever above 101 F, pus (white drainage) or increased drainage or redness at the wound, or calf pain, call your surgeon's office.   Constipation Prevention   Complete by: As directed    Drink plenty of fluids.  Prune juice may be helpful.  You may use a stool  softener, such as Colace (over the counter) 100 mg twice a day.  Use MiraLax (over the counter) for constipation as needed.   Diet - low sodium heart healthy   Complete by: As directed    Discharge instructions   Complete by: As directed    INSTRUCTIONS AFTER JOINT REPLACEMENT   Remove items at home which could result in a fall. This includes throw rugs or furniture in walking pathways ICE to the affected joint every three hours while awake for 30 minutes at a time, for at least the first 3-5 days, and then as needed for pain and  swelling.  Continue to use ice for pain and swelling. You may notice swelling that will progress down to the foot and ankle.  This is normal after surgery.  Elevate your leg when you are not up walking on it.   Continue to use the breathing machine you got in the hospital (incentive spirometer) which will help keep your temperature down.  It is common for your temperature to cycle up and down following surgery, especially at night when you are not up moving around and exerting yourself.  The breathing machine keeps your lungs expanded and your temperature down.   DIET:  As you were doing prior to hospitalization, we recommend a well-balanced diet.  DRESSING / WOUND CARE / SHOWERING  Keep the surgical dressing until follow up.  The dressing is water proof, so you can shower without any extra covering.  IF THE DRESSING FALLS OFF or the wound gets wet inside, change the dressing with sterile gauze.  Please use good hand washing techniques before changing the dressing.  Do not use any lotions or creams on the incision until instructed by your surgeon.    ACTIVITY  Increase activity slowly as tolerated, but follow the weight bearing instructions below.   No driving for 6 weeks or until further direction given by your physician.  You cannot drive while taking narcotics.  No lifting or carrying greater than 10 lbs. until further directed by your surgeon. Avoid periods of  inactivity such as sitting longer than an hour when not asleep. This helps prevent blood clots.  You may return to work once you are authorized by your doctor.     WEIGHT BEARING   Weight bearing as tolerated with assist device (walker, cane, etc) as directed, use it as long as suggested by your surgeon or therapist, typically at least 4-6 weeks.   EXERCISES  Results after joint replacement surgery are often greatly improved when you follow the exercise, range of motion and muscle strengthening exercises prescribed by your doctor. Safety measures are also important to protect the joint from further injury. Any time any of these exercises cause you to have increased pain or swelling, decrease what you are doing until you are comfortable again and then slowly increase them. If you have problems or questions, call your caregiver or physical therapist for advice.   Rehabilitation is important following a joint replacement. After just a few days of immobilization, the muscles of the leg can become weakened and shrink (atrophy).  These exercises are designed to build up the tone and strength of the thigh and leg muscles and to improve motion. Often times heat used for twenty to thirty minutes before working out will loosen up your tissues and help with improving the range of motion but do not use heat for the first two weeks following surgery (sometimes heat can increase post-operative swelling).   These exercises can be done on a training (exercise) mat, on the floor, on a table or on a bed. Use whatever works the best and is most comfortable for you.    Use music or television while you are exercising so that the exercises are a pleasant break in your day. This will make your life better with the exercises acting as a break in your routine that you can look forward to.   Perform all exercises about fifteen times, three times per day or as directed.  You should exercise both the operative leg and the  other leg as well.  Exercises  include:   Quad Sets - Tighten up the muscle on the front of the thigh (Quad) and hold for 5-10 seconds.   Straight Leg Raises - With your knee straight (if you were given a brace, keep it on), lift the leg to 60 degrees, hold for 3 seconds, and slowly lower the leg.  Perform this exercise against resistance later as your leg gets stronger.  Leg Slides: Lying on your back, slowly slide your foot toward your buttocks, bending your knee up off the floor (only go as far as is comfortable). Then slowly slide your foot back down until your leg is flat on the floor again.  Angel Wings: Lying on your back spread your legs to the side as far apart as you can without causing discomfort.  Hamstring Strength:  Lying on your back, push your heel against the floor with your leg straight by tightening up the muscles of your buttocks.  Repeat, but this time bend your knee to a comfortable angle, and push your heel against the floor.  You may put a pillow under the heel to make it more comfortable if necessary.   A rehabilitation program following joint replacement surgery can speed recovery and prevent re-injury in the future due to weakened muscles. Contact your doctor or a physical therapist for more information on knee rehabilitation.    CONSTIPATION  Constipation is defined medically as fewer than three stools per week and severe constipation as less than one stool per week.  Even if you have a regular bowel pattern at home, your normal regimen is likely to be disrupted due to multiple reasons following surgery.  Combination of anesthesia, postoperative narcotics, change in appetite and fluid intake all can affect your bowels.   YOU MUST use at least one of the following options; they are listed in order of increasing strength to get the job done.  They are all available over the counter, and you may need to use some, POSSIBLY even all of these options:    Drink plenty of fluids  (prune juice may be helpful) and high fiber foods Colace 100 mg by mouth twice a day  Senokot for constipation as directed and as needed Dulcolax (bisacodyl), take with full glass of water  Miralax (polyethylene glycol) once or twice a day as needed.  If you have tried all these things and are unable to have a bowel movement in the first 3-4 days after surgery call either your surgeon or your primary doctor.    If you experience loose stools or diarrhea, hold the medications until you stool forms back up.  If your symptoms do not get better within 1 week or if they get worse, check with your doctor.  If you experience "the worst abdominal pain ever" or develop nausea or vomiting, please contact the office immediately for further recommendations for treatment.   ITCHING:  If you experience itching with your medications, try taking only a single pain pill, or even half a pain pill at a time.  You can also use Benadryl over the counter for itching or also to help with sleep.   TED HOSE STOCKINGS:  Use stockings on both legs until for at least 2 weeks or as directed by physician office. They may be removed at night for sleeping.  MEDICATIONS:  See your medication summary on the "After Visit Summary" that nursing will review with you.  You may have some home medications which will be placed on hold until you complete  the course of blood thinner medication.  It is important for you to complete the blood thinner medication as prescribed.  PRECAUTIONS:  If you experience chest pain or shortness of breath - call 911 immediately for transfer to the hospital emergency department.   If you develop a fever greater that 101 F, purulent drainage from wound, increased redness or drainage from wound, foul odor from the wound/dressing, or calf pain - CONTACT YOUR SURGEON.                                                   FOLLOW-UP APPOINTMENTS:  If you do not already have a post-op appointment, please call the  office for an appointment to be seen by your surgeon.  Guidelines for how soon to be seen are listed in your "After Visit Summary", but are typically between 1-4 weeks after surgery.  OTHER INSTRUCTIONS:   Knee Replacement:  Do not place pillow under knee, focus on keeping the knee straight while resting. CPM instructions: 0-90 degrees, 2 hours in the morning, 2 hours in the afternoon, and 2 hours in the evening. Place foam block, curve side up under heel at all times except when in CPM or when walking.  DO NOT modify, tear, cut, or change the foam block in any way.   DENTAL ANTIBIOTICS:  In most cases prophylactic antibiotics for Dental procdeures after total joint surgery are not necessary.  Exceptions are as follows:  1. History of prior total joint infection  2. Severely immunocompromised (Organ Transplant, cancer chemotherapy, Rheumatoid biologic meds such as [place])  3. Poorly controlled diabetes (A1C &gt; 8.0, blood glucose over 200)  If you have one of these conditions, contact your surgeon for an antibiotic prescription, prior to your dental procedure.   MAKE SURE YOU:  Understand these instructions.  Get help right away if you are not doing well or get worse.    Thank you for letting us be a part of your medical care team.  It is a privilege we respect greatly.  We hope these instructions will help you stay on track for a fast and full recovery!  Dental Antibiotics:  In most cases prophylactic antibiotics for Dental procdeures after total joint surgery are not necessary.  Exceptions are as follows:  1. History of prior total joint infection  2. Severely immunocompromised (Organ Transplant, cancer chemotherapy, Rheumatoid biologic meds such as [place])  3. Poorly controlled diabetes (A1C &gt; 8.0, blood glucose over 200)  If you have one of these conditions, contact your surgeon for an antibiotic prescription, prior to your dental procedure.   Driving restrictions    Complete by: As directed    No driving for 6 weeks   Increase activity slowly as tolerated   Complete by: As directed    Lifting restrictions   Complete by: As directed    No lifting for 8 weeks       Follow-up Information    Mcarthur Rossetti, MD Follow up in 2 week(s).   Specialty: Orthopedic Surgery Contact information: [address] [place] [address] [phone]                Signed: Erskine Emery 05/11/2020, 2:35 PM

## 2020-05-11 NOTE — Telephone Encounter (Signed)
Called and verbal ok was given

## 2020-05-11 NOTE — Telephone Encounter (Signed)
[name], PT with [facility] would like verbal orders for PT treatment, 3 x week for 1 week and 2 x week for 1 week.  Cb# [phone].  Please advise.  Thank you.

## 2020-05-12 LAB — AEROBIC/ANAEROBIC CULTURE W GRAM STAIN (SURGICAL/DEEP WOUND): Culture: NO GROWTH

## 2020-05-13 DIAGNOSIS — N3281 Overactive bladder: Secondary | ICD-10-CM | POA: Diagnosis not present

## 2020-05-13 DIAGNOSIS — T84032D Mechanical loosening of internal right knee prosthetic joint, subsequent encounter: Secondary | ICD-10-CM | POA: Diagnosis not present

## 2020-05-13 DIAGNOSIS — F419 Anxiety disorder, unspecified: Secondary | ICD-10-CM | POA: Diagnosis not present

## 2020-05-13 DIAGNOSIS — F32A Depression, unspecified: Secondary | ICD-10-CM | POA: Diagnosis not present

## 2020-05-13 DIAGNOSIS — E039 Hypothyroidism, unspecified: Secondary | ICD-10-CM | POA: Diagnosis not present

## 2020-05-13 DIAGNOSIS — E559 Vitamin D deficiency, unspecified: Secondary | ICD-10-CM | POA: Diagnosis not present

## 2020-05-14 ENCOUNTER — Other Ambulatory Visit: Payer: Self-pay | Admitting: Family Medicine

## 2020-05-15 DIAGNOSIS — E039 Hypothyroidism, unspecified: Secondary | ICD-10-CM | POA: Diagnosis not present

## 2020-05-15 DIAGNOSIS — F419 Anxiety disorder, unspecified: Secondary | ICD-10-CM | POA: Diagnosis not present

## 2020-05-15 DIAGNOSIS — E559 Vitamin D deficiency, unspecified: Secondary | ICD-10-CM | POA: Diagnosis not present

## 2020-05-15 DIAGNOSIS — F32A Depression, unspecified: Secondary | ICD-10-CM | POA: Diagnosis not present

## 2020-05-15 DIAGNOSIS — T84032D Mechanical loosening of internal right knee prosthetic joint, subsequent encounter: Secondary | ICD-10-CM | POA: Diagnosis not present

## 2020-05-15 DIAGNOSIS — N3281 Overactive bladder: Secondary | ICD-10-CM | POA: Diagnosis not present

## 2020-05-17 ENCOUNTER — Other Ambulatory Visit: Payer: Self-pay

## 2020-05-17 ENCOUNTER — Telehealth: Payer: Self-pay | Admitting: Orthopaedic Surgery

## 2020-05-17 DIAGNOSIS — F419 Anxiety disorder, unspecified: Secondary | ICD-10-CM | POA: Diagnosis not present

## 2020-05-17 DIAGNOSIS — F32A Depression, unspecified: Secondary | ICD-10-CM | POA: Diagnosis not present

## 2020-05-17 DIAGNOSIS — E559 Vitamin D deficiency, unspecified: Secondary | ICD-10-CM | POA: Diagnosis not present

## 2020-05-17 DIAGNOSIS — T84032D Mechanical loosening of internal right knee prosthetic joint, subsequent encounter: Secondary | ICD-10-CM | POA: Diagnosis not present

## 2020-05-17 DIAGNOSIS — E039 Hypothyroidism, unspecified: Secondary | ICD-10-CM | POA: Diagnosis not present

## 2020-05-17 DIAGNOSIS — N3281 Overactive bladder: Secondary | ICD-10-CM | POA: Diagnosis not present

## 2020-05-17 NOTE — Telephone Encounter (Signed)
Patient called. She would like to have her outpatient PT done at [facility]. Her call back number is [phone]

## 2020-05-17 NOTE — Patient Outreach (Signed)
[facility]) Care Management  05/17/2020  Vanessa Christensen 1950/06/20 [id number]   Red emmi:  Incoming call from patient after I left a message.  Explained to patient reason for call.  Patient reports that she is not having any depression but states she is anxious when in pain.  Reports gets up at night to go to the bathroom and then has pain and gets anxious.  Reports she has a follow up with Dr. Rush Farmer in 3 days and plans to talk to him at that time.  Reviewed option of OTC Melatonin.   Reviewed other emmi alert of " additional questions"   Patient denies additional questions at this time.  PLAN: close case as all questions answered.  No additional needs.  Tomasa Rand, RN, BSN, CEN [facility] [employer] [phone]

## 2020-05-17 NOTE — Patient Outreach (Signed)
[facility]) Care Management  05/17/2020  Cabria Micalizzi [birth year] [id number]   Red emmi: Date of call:    05/14/2020 Reason for alert:  Sad/hopeless/anxious/empty----yes                              Other questions/problems-----yes   Placed call to patient to review red emmi. No answer. Left a message requesting a call back.  PLAN: will mail unsuccessful outreach letter and call back in 3 days.  Tomasa Rand, RN, BSN, CEN [facility] [employer] [phone]

## 2020-05-18 ENCOUNTER — Other Ambulatory Visit: Payer: Self-pay

## 2020-05-18 DIAGNOSIS — Z96651 Presence of right artificial knee joint: Secondary | ICD-10-CM

## 2020-05-18 NOTE — Telephone Encounter (Signed)
Sent order.

## 2020-05-19 DIAGNOSIS — E559 Vitamin D deficiency, unspecified: Secondary | ICD-10-CM | POA: Diagnosis not present

## 2020-05-19 DIAGNOSIS — T84032D Mechanical loosening of internal right knee prosthetic joint, subsequent encounter: Secondary | ICD-10-CM | POA: Diagnosis not present

## 2020-05-19 DIAGNOSIS — F32A Depression, unspecified: Secondary | ICD-10-CM | POA: Diagnosis not present

## 2020-05-19 DIAGNOSIS — N3281 Overactive bladder: Secondary | ICD-10-CM | POA: Diagnosis not present

## 2020-05-19 DIAGNOSIS — E039 Hypothyroidism, unspecified: Secondary | ICD-10-CM | POA: Diagnosis not present

## 2020-05-19 DIAGNOSIS — F419 Anxiety disorder, unspecified: Secondary | ICD-10-CM | POA: Diagnosis not present

## 2020-05-20 ENCOUNTER — Ambulatory Visit: Payer: Medicare Other

## 2020-05-20 ENCOUNTER — Encounter: Payer: Self-pay | Admitting: Orthopaedic Surgery

## 2020-05-20 ENCOUNTER — Ambulatory Visit (INDEPENDENT_AMBULATORY_CARE_PROVIDER_SITE_OTHER): Payer: Medicare Other | Admitting: Orthopaedic Surgery

## 2020-05-20 DIAGNOSIS — T84012D Broken internal right knee prosthesis, subsequent encounter: Secondary | ICD-10-CM

## 2020-05-20 DIAGNOSIS — Z96651 Presence of right artificial knee joint: Secondary | ICD-10-CM

## 2020-05-20 MED ORDER — ZOLPIDEM TARTRATE 5 MG PO TABS: 5.0000 mg | ORAL_TABLET | Freq: Every evening | ORAL | 0 refills | Status: DC | PRN

## 2020-05-20 NOTE — Progress Notes (Signed)
The patient comes in today in follow-up after having a revision arthroplasty of a loose right tibial tray from a total knee arthroplasty.  This was aseptic loosening.  There was no evidence of infection.  She is doing well overall.  Originally her right knee was replaced elsewhere about 7 years ago.  She does report increased range of motion and strength but she is having problems sleeping at night.  She has been on a 325 mg aspirin twice a day.  She was on 81 mg prior to this and we will have her go back to the 81 mg daily.  On exam her staples have been removed and Steri-Strips applied.  Her calf is soft.  She actually has pretty good range of motion of her right knee today.  She can flex and extend at the ankle and toes.  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  We will set her up for outpatient physical therapy to work on her balance and coordination as well as range of motion and strengthening of the right knee.  She is transitioned already from a walker to a cane.  I will see her back in 4 weeks to see how she is doing overall but no x-rays are needed unless there are issues.  I will send in a sleep medication for

## 2020-05-21 ENCOUNTER — Other Ambulatory Visit: Payer: Self-pay

## 2020-05-21 DIAGNOSIS — Z96651 Presence of right artificial knee joint: Secondary | ICD-10-CM

## 2020-05-24 ENCOUNTER — Other Ambulatory Visit: Payer: Self-pay

## 2020-05-24 ENCOUNTER — Encounter: Payer: Self-pay | Admitting: Physical Therapy

## 2020-05-24 ENCOUNTER — Ambulatory Visit (INDEPENDENT_AMBULATORY_CARE_PROVIDER_SITE_OTHER): Payer: Medicare Other | Admitting: Physical Therapy

## 2020-05-24 DIAGNOSIS — M25561 Pain in right knee: Secondary | ICD-10-CM | POA: Diagnosis not present

## 2020-05-24 DIAGNOSIS — R2689 Other abnormalities of gait and mobility: Secondary | ICD-10-CM | POA: Diagnosis not present

## 2020-05-24 DIAGNOSIS — R600 Localized edema: Secondary | ICD-10-CM | POA: Diagnosis not present

## 2020-05-24 DIAGNOSIS — M6281 Muscle weakness (generalized): Secondary | ICD-10-CM

## 2020-05-24 NOTE — Patient Instructions (Signed)
Access Code: [email or web address] URL: [email or web address] Date: 05/24/2020 Prepared by: Elsie Ra  Exercises Seated Hamstring Stretch - 2 x daily - 6 x weekly - 1 sets - 3 reps - 30 hold Seated Ankle Dorsiflexion Stretch - 2 x daily - 6 x weekly - 1 sets - 10 reps - 10 sec hold Seated Small Alternating Straight Leg Lifts with Heel Touch - 2 x daily - 6 x weekly - 2-3 sets - 10 reps Sit to Stand with Hands on Knees - 2 x daily - 6 x weekly - 1-2 sets - 10 reps Standing Single Leg Stance with Counter Support - 2 x daily - 6 x weekly - 1 sets - 3 reps - 30 sec hold

## 2020-05-25 ENCOUNTER — Ambulatory Visit (INDEPENDENT_AMBULATORY_CARE_PROVIDER_SITE_OTHER): Payer: Medicare Other | Admitting: Bariatrics

## 2020-05-25 ENCOUNTER — Encounter (INDEPENDENT_AMBULATORY_CARE_PROVIDER_SITE_OTHER): Payer: Self-pay | Admitting: Bariatrics

## 2020-05-25 VITALS — BP 106/72 | HR 103 | Temp 98.0°F | Ht 62.0 in | Wt 192.0 lb

## 2020-05-25 DIAGNOSIS — E559 Vitamin D deficiency, unspecified: Secondary | ICD-10-CM | POA: Diagnosis not present

## 2020-05-25 DIAGNOSIS — E038 Other specified hypothyroidism: Secondary | ICD-10-CM

## 2020-05-25 DIAGNOSIS — Z6835 Body mass index (BMI) 35.0-35.9, adult: Secondary | ICD-10-CM

## 2020-05-25 MED ORDER — VITAMIN D (ERGOCALCIFEROL) 1.25 MG (50000 UNIT) PO CAPS: 50000.0000 [IU] | ORAL_CAPSULE | ORAL | 0 refills | Status: DC

## 2020-05-25 NOTE — Progress Notes (Signed)
Chief Complaint:   OBESITY Vanessa Christensen is here to discuss her progress with her obesity treatment plan along with follow-up of her obesity related diagnoses. Vanessa Christensen is on the Category 4 Plan and states she is following her eating plan approximately 0% of the time. Vanessa Christensen states she is doing PT.   Today's visit was #: 17 Starting weight: 241 lbs Starting date: 08/13/2018 Today's weight: 192 lbs Today's date: 05/25/2020 Total lbs lost to date: 49 Total lbs lost since last in-office visit: 2  Interim History: Vanessa Christensen is down 2 lbs. She had her revision total right knee replacement and started PT yesterday.  Subjective:   Other specified hypothyroidism. Vanessa Christensen reports taking her medication as prescribed. She does report decreased energy, but this is related to surgery and PT.   Lab Results  Component Value Date   TSH 1.670 01/15/2020   Vitamin D deficiency. No nausea, vomiting, or muscle weakness.    Ref. Range 01/15/2020 12:54  Vitamin D, 25-Hydroxy Latest Ref Range: 30.0 - 100.0 ng/mL 36.9   Assessment/Plan:   Other specified hypothyroidism. Patient with long-standing hypothyroidism, on levothyroxine therapy. She appears euthyroid. Orders and follow up as documented in patient record. Vanessa Christensen will continue her medication as directed.   Counseling . Good thyroid control is important for overall health. Supratherapeutic thyroid levels are dangerous and will not improve weight loss results.  . The correct way to take levothyroxine is fasting, with water, separated by at least 30 minutes from breakfast, and separated by more than 4 hours from calcium, iron, multivitamins, acid reflux medications (PPIs).   Vitamin D deficiency. Low Vitamin D level contributes to fatigue and are associated with obesity, breast, and colon cancer. She was given a prescription for Vitamin D, Ergocalciferol, (DRISDOL) 1.25 MG (50000 UNIT) CAPS capsule every week #4 with 0 refills and will follow-up for  routine testing of Vitamin D, at least 2-3 times per year to avoid over-replacement.   Class 2 severe obesity with serious comorbidity and body mass index (BMI) of 35.0 to 35.9 in adult, unspecified obesity type (Vanessa Christensen).  Vanessa Christensen is currently in the action stage of change. As such, her goal is to continue with weight loss efforts. She has agreed to the Category 4 Plan.   She will increase her protein and water intake and will work on mindful eating.  Exercise goals: Vanessa Christensen will continue PT and will consider getting back to the gym (1 month).  Behavioral modification strategies: increasing lean protein intake, decreasing simple carbohydrates, increasing vegetables, increasing water intake, decreasing eating out, no skipping meals, meal planning and cooking strategies, keeping healthy foods in the home, dealing with family or coworker sabotage, travel eating strategies, holiday eating strategies  and avoiding temptations.  Vanessa Christensen has agreed to follow-up with our clinic in 3-4 weeks. She was informed of the importance of frequent follow-up visits to maximize her success with intensive lifestyle modifications for her multiple health conditions.   Objective:   Blood pressure 106/72, pulse (!) 103, temperature 98 F (36.7 C), temperature source Oral, height 5\' 2"  (1.575 m), weight 192 lb (87.1 kg), SpO2 98 %. Body mass index is 35.12 kg/m.  General: Cooperative, alert, well developed, in no acute distress. HEENT: Conjunctivae and lids unremarkable. Cardiovascular: Regular rhythm.  Lungs: Normal work of breathing. Neurologic: No focal deficits.   Lab Results  Component Value Date   CREATININE 0.51 05/08/2020   BUN 12 05/08/2020   NA 134 (L) 05/08/2020   K 3.5 05/08/2020  CL 101 05/08/2020   CO2 21 (L) 05/08/2020   Lab Results  Component Value Date   ALT 19 01/15/2020   AST 18 01/15/2020   ALKPHOS 71 01/15/2020   BILITOT 0.3 01/15/2020   Lab Results  Component Value Date   HGBA1C  5.6 01/15/2020   HGBA1C 5.4 11/04/2018   HGBA1C 5.6 08/13/2018   Lab Results  Component Value Date   INSULIN 8.0 01/15/2020   INSULIN 10.9 11/04/2018   INSULIN 13.2 08/13/2018   Lab Results  Component Value Date   TSH 1.670 01/15/2020   Lab Results  Component Value Date   CHOL 188 01/15/2020   HDL 52 01/15/2020   LDLCALC 124 (H) 01/15/2020   TRIG 63 01/15/2020   CHOLHDL 4 05/08/2018   Lab Results  Component Value Date   WBC 11.4 (H) 05/08/2020   HGB 11.3 (L) 05/08/2020   HCT 35.8 (L) 05/08/2020   MCV 93.5 05/08/2020   PLT 161 05/08/2020   No results found for: IRON, TIBC, FERRITIN  Obesity Behavioral Intervention:   Approximately 15 minutes were spent on the discussion below.  ASK: We discussed the diagnosis of obesity with Vanessa Christensen today and Vanessa Christensen agreed to give [name] permission to discuss obesity behavioral modification therapy today.  ASSESS: Vanessa Christensen has the diagnosis of obesity and her BMI today is 35.2. Vanessa Christensen is in the action stage of change.   ADVISE: Vanessa Christensen was educated on the multiple health risks of obesity as well as the benefit of weight loss to improve her health. She was advised of the need for long term treatment and the importance of lifestyle modifications to improve her current health and to decrease her risk of future health problems.  AGREE: Multiple dietary modification options and treatment options were discussed and Vanessa Christensen agreed to follow the recommendations documented in the above note.  ARRANGE: Vanessa Christensen was educated on the importance of frequent visits to treat obesity as outlined per CMS and USPSTF guidelines and agreed to schedule her next follow up appointment today.  Attestation Statements:   Reviewed by clinician on day of visit: allergies, medications, problem list, medical history, surgical history, family history, social history, and previous encounter notes.  [name], am acting as Location manager for [employer], DO   I  have reviewed the above documentation for accuracy and completeness, and I agree with the above. Jearld Lesch, DO

## 2020-05-25 NOTE — Therapy (Signed)
[facility] Physical Therapy [address] [place], [place], [address] Phone: [phone]   Fax:  [phone]  Physical Therapy Evaluation  Patient Details  Name: Vanessa Christensen MRN: [id number] Date of Birth: 10-24-1950 Referring Provider (PT): Mcarthur Rossetti, MD   Encounter Date: 05/24/2020   PT End of Session - 05/24/20 2354    Visit Number 1    Number of Visits 12    Date for PT Re-Evaluation 07/19/20    Progress Note Due on Visit 10    PT Start Time 1600    PT Stop Time 5809    PT Time Calculation (min) 45 min           Past Medical History:  Diagnosis Date   Anemia    hx of   Anxiety    pt denies   Arthritis    hands and feet   Cancer ([place]) 10/04/2006   breast   Cataracts, both eyes    Depression    Depression    Diverticulosis of colon    Glaucoma    Heart murmur    No work up done 5 years ago   Joint pain    Knee pain    Obesity    OSA on CPAP    Osteoarthritis    Sleep apnea    no CPAP- no longer needed d/t weight loss   Thyroid activity decreased     Past Surgical History:  Procedure Laterality Date   BREAST SURGERY Bilateral    CATARACT EXTRACTION     COLONOSCOPY     GLAUCOMA REPAIR     MASTECTOMY, RADICAL Bilateral    neulasta induced sterile abscesses     THYROIDECTOMY, PARTIAL     TONSILLECTOMY     TOTAL KNEE ARTHROPLASTY Right    TOTAL KNEE REVISION Right 05/07/2020   Procedure: RIGHT TOTAL KNEE REVISION ARTHROPLASTY;  Surgeon: Mcarthur Rossetti, MD;  Location: [facility];  Service: Orthopedics;  Laterality: Right;    There were no vitals filed for this visit.    Subjective Assessment - 05/24/20 1557    Subjective s/p right knee replacement revsision on 05/07/20, TKA was 7 years ago. 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 She did not use AD PLOF, she is having trouble sleeping and wants to be able to go back to the gym    Pertinent History s/p right knee replacement  revsision on 05/07/20, TKA was 7 years ago    Limitations Standing;Walking;House hold activities    How long can you stand comfortably? hour    How long can you walk comfortably? one hour    Patient Stated Goals get back to the gym, sleep through the night    Currently in Pain? Yes    Pain Score 4     Pain Location Knee    Pain Orientation Right    Pain Descriptors / Indicators Aching;Throbbing    Pain Type Surgical pain    Pain Radiating Towards down her shin    Pain Onset More than a month ago    Pain Frequency Intermittent    Aggravating Factors  up on her feet    Pain Relieving Factors extra strength tyleonol, ice              [facility] PT Assessment - 05/24/20 0001      Assessment   Medical Diagnosis s/p right knee replacement revsision on 05/07/20, TKA was 7 years ago    Referring Provider (PT) Mcarthur Rossetti, MD  Onset Date/Surgical Date 05/07/20    Next MD Visit 06/18/19      Balance Screen   Has the patient fallen in the past 6 months No    Has the patient had a decrease in activity level because of a fear of falling?  No    Is the patient reluctant to leave their home because of a fear of falling?  No      Home Ecologist residence    Additional Comments 2 steps in the garage      Prior Function   Level of Independence Independent    Leisure gym      Cognition   Overall Cognitive Status Within Functional Limits for tasks assessed      Observation/Other Assessments   Focus on Therapeutic Outcomes (FOTO)  2nd visit, front office did not capture on eval      Functional Tests   Functional tests Single leg stance      Single Leg Stance   Comments unable to on Rt without UE support      ROM / Strength   AROM / PROM / Strength AROM;PROM;Strength      AROM   AROM Assessment Site Knee    Right/Left Knee Right    Right Knee Extension -3    Right Knee Flexion 105      PROM   PROM Assessment Site Knee    Right/Left Knee  Right    Right Knee Extension 0    Right Knee Flexion 110      Strength   Overall Strength Comments Rt knee 4/5 MMT      Ambulation/Gait   Gait Comments can ambulate short distances without AD but needs AD for limited community distances and above, slower velocity, decreased hip/knee flexion, and decreased step length with gait      Standardized Balance Assessment   Standardized Balance Assessment Five Times Sit to Stand    Five times sit to stand comments  17.5 sec with UE support                      Objective measurements completed on examination: See above findings.       [facility] Adult PT Treatment/Exercise - 05/24/20 0001      Exercises   Exercises Knee/Hip      Knee/Hip Exercises: Stretches   Knee: Self-Stretch Limitations tailgat stretch 10 sec X 5 reps    Gastroc Stretch Both;3 reps;30 seconds      Knee/Hip Exercises: Aerobic   Nustep L4 X 6 min      Modalities   Modalities Vasopneumatic      Vasopneumatic   Number Minutes Vasopneumatic  10 minutes    Vasopnuematic Location  Knee    Vasopneumatic Pressure Medium    Vasopneumatic Temperature  34      Manual Therapy   Manual therapy comments Rt knee PROM                  PT Education - 05/24/20 2353    Education Details HEP, POC, exam findings    Person(s) Educated Patient    Methods Explanation;Demonstration;Handout    Comprehension Verbalized understanding;Need further instruction               PT Long Term Goals - 05/25/20 0003      PT LONG TERM GOAL #1   Title Pt will improve FOTO score to predicted value    Time 8  Period Weeks    Status New    Target Date 07/20/20      PT LONG TERM GOAL #2   Title Pt will improve Rt knee ROM to 0-120 deg    Time 8    Period Weeks    Status New    Target Date 07/20/20      PT LONG TERM GOAL #3   Title Pt will improve 5TSTS test to 13 seconds and SLS to 5 sec to show improved balance    Time 8    Period Weeks    Status New       PT LONG TERM GOAL #4   Title Pt will be able to ambulate community distances without AD    Time 8    Period Weeks    Status New      PT LONG TERM GOAL #5   Title Pt will improve Rt knee strength to 5/5 and feel ready to return to gym.    Time 8    Period Weeks    Status New                  Plan - 05/24/20 2358    Clinical Impression Statement Pt presents with Rt knee pain, stiffness, weakness, edema, difficulty with prolonged standing and walking, and decreased balance S/P Rt TKA revision 05/07/20. She will benefit from skilled PT to address her functional deficits.    Examination-Activity Limitations Bend;Stairs;Sleep;Squat;Lift;Stand    Examination-Participation Restrictions Cleaning;Community Activity;Driving    Stability/Clinical Decision Making Stable/Uncomplicated    Clinical Decision Making Moderate    Rehab Potential Good    PT Frequency 2x / week    PT Duration 8 weeks    PT Treatment/Interventions ADLs/Self Care Home Management;Cryotherapy;Presenter, broadcasting;Therapeutic exercise;Iontophoresis 4mg /ml Dexamethasone;Therapeutic activities;Neuromuscular re-education;Vestibular;Taping;Vasopneumatic Device;Dry needling;Passive range of motion    PT Next Visit Plan FOTO and set goal, review and update HEP, needs balance, gait, strength, ROM    PT Home Exercise Plan Access Code: [email or web address]    Consulted and Agree with Plan of Care Patient           Patient will benefit from skilled therapeutic intervention in order to improve the following deficits and impairments:  Decreased activity tolerance,Decreased balance,Decreased endurance,Decreased mobility,Decreased range of motion,Decreased strength,Difficulty walking  Visit Diagnosis: Acute pain of right knee  Muscle weakness (generalized)  Other abnormalities of gait and mobility  Localized edema     Problem List Patient Active Problem List   Diagnosis Date Noted    Status post revision of total replacement of right knee 05/07/2020   Failed total knee, right, 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/2020   History of total right knee replacement 01/28/2020   Vitamin D deficiency 08/29/2018   Insulin resistance 08/29/2018   Class 3 severe obesity with serious comorbidity and body mass index (BMI) of 40.0 to 44.9 in adult [facility]) 08/14/2018   Other specified glaucoma 08/14/2018   Incontinence of urine in female 11/07/2017   Vertigo 11/07/2017   Left knee pain 10/25/2016   Genetic testing 08/08/2016   Hypothyroidism 12/02/2015   Anxiety and depression 12/02/2015   OAB (overactive bladder) 12/02/2015   Breast cancer of upper-outer quadrant of left female breast ([place]) 12/02/2015   Obesity (BMI 30-39.9) 12/02/2015    Silvestre Mesi 05/25/2020, 12:07 AM  [facility] Physical Therapy [address] [place], [place], [address] Phone: [phone]   Fax:  [phone]  Name: Vanessa Christensen MRN: [id number] Date of Birth: [birth year]

## 2020-05-26 ENCOUNTER — Other Ambulatory Visit: Payer: Self-pay

## 2020-05-26 ENCOUNTER — Encounter (INDEPENDENT_AMBULATORY_CARE_PROVIDER_SITE_OTHER): Payer: Self-pay | Admitting: Bariatrics

## 2020-05-26 ENCOUNTER — Encounter: Payer: Self-pay | Admitting: Rehabilitative and Restorative Service Providers"

## 2020-05-26 ENCOUNTER — Ambulatory Visit (INDEPENDENT_AMBULATORY_CARE_PROVIDER_SITE_OTHER): Payer: Medicare Other | Admitting: Rehabilitative and Restorative Service Providers"

## 2020-05-26 DIAGNOSIS — M6281 Muscle weakness (generalized): Secondary | ICD-10-CM

## 2020-05-26 DIAGNOSIS — M25661 Stiffness of right knee, not elsewhere classified: Secondary | ICD-10-CM | POA: Diagnosis not present

## 2020-05-26 DIAGNOSIS — R600 Localized edema: Secondary | ICD-10-CM | POA: Diagnosis not present

## 2020-05-26 DIAGNOSIS — R262 Difficulty in walking, not elsewhere classified: Secondary | ICD-10-CM | POA: Diagnosis not present

## 2020-05-26 NOTE — Therapy (Signed)
[facility] Physical Therapy [address] [place], [place], [address] Phone: [phone]   Fax:  [phone]  Physical Therapy Treatment  Patient Details  Name: Vanessa Christensen MRN: [id number] Date of Birth: Jun 29, 1950 Referring Provider (PT): Mcarthur Rossetti, MD   Encounter Date: 05/26/2020   PT End of Session - 05/26/20 1155    Visit Number 2    Number of Visits 12    Date for PT Re-Evaluation 07/19/20    Progress Note Due on Visit 10    PT Start Time 1102    PT Stop Time 1150    PT Time Calculation (min) 48 min    Activity Tolerance Patient tolerated treatment well;No increased pain    Behavior During Therapy WFL for tasks assessed/performed           Past Medical History:  Diagnosis Date  . Anemia    hx of  . Anxiety    pt denies  . Arthritis    hands and feet  . Cancer ([place]) 10/04/2006   breast  . Cataracts, both eyes   . Depression   . Depression   . Diverticulosis of colon   . Glaucoma   . Heart murmur    No work up done 5 years ago  . Joint pain   . Knee pain   . Obesity   . OSA on CPAP   . Osteoarthritis   . Sleep apnea    no CPAP- no longer needed d/t weight loss  . Thyroid activity decreased     Past Surgical History:  Procedure Laterality Date  . BREAST SURGERY Bilateral   . CATARACT EXTRACTION    . COLONOSCOPY    . GLAUCOMA REPAIR    . MASTECTOMY, RADICAL Bilateral   . neulasta induced sterile abscesses    . THYROIDECTOMY, PARTIAL    . TONSILLECTOMY    . TOTAL KNEE ARTHROPLASTY Right   . TOTAL KNEE REVISION Right 05/07/2020   Procedure: RIGHT TOTAL KNEE REVISION ARTHROPLASTY;  Surgeon: Mcarthur Rossetti, MD;  Location: [facility];  Service: Orthopedics;  Laterality: Right;    There were no vitals filed for this visit.   Subjective Assessment - 05/26/20 1152    Subjective Percy reports that she is off Ambien now.  She was able to get a full night's sleep.  Good early HEP compliance.    Pertinent History  s/p right knee replacement revsision on 05/07/20, TKA was 7 years ago    Limitations Standing;Walking;House hold activities    How long can you stand comfortably? hour    How long can you walk comfortably? one hour    Patient Stated Goals get back to the gym, sleep through the night    Currently in Pain? Yes    Pain Score 4     Pain Location Knee    Pain Orientation Right    Pain Descriptors / Indicators Aching;Tightness;Sore    Pain Type Chronic pain;Surgical pain    Pain Onset More than a month ago    Pain Frequency Intermittent    Aggravating Factors  Prolonged sitting and WB    Pain Relieving Factors Tylenol and ice    Effect of Pain on Daily Activities Uses a cane.  Is slower with activities than she would like.  Limited WB endurance.              [facility] PT Assessment - 05/26/20 0001      AROM   AROM Assessment Site Knee  Right/Left Knee Right    Right Knee Extension 0    Right Knee Flexion 109                         [facility] Adult PT Treatment/Exercise - 05/26/20 0001      Therapeutic Activites    Therapeutic Activities ADL's    ADL's Step-up and over 8 minutes (slow eccentrics)      Neuro Re-ed    Neuro Re-ed Details  Tandem balance eyes open/closed 4X each  20 seconds      Exercises   Exercises Knee/Hip      Knee/Hip Exercises: Stretches   Knee: Self-Stretch Limitations Tailgate knee flexion AROM 1 minutes      Knee/Hip Exercises: Aerobic   Recumbent Bike Seat 4 Level 3 8 minutes      Knee/Hip Exercises: Seated   Long Arc Quad Strengthening;Both;3 sets;5 reps    Long Arc Quad Weight 0 lbs.    Long Arc Quad Limitations slow eccentrics    Sit to [employer] 2 sets;5 reps;without UE support   slow eccentrics     Knee/Hip Exercises: Supine   Quad Sets Strengthening;Both;1 set;10 reps   5 seconds                 PT Education - 05/26/20 1154    Education Details Reviewed and updated HEP.    Person(s) Educated Patient    Methods  Explanation;Demonstration;Tactile cues;Verbal cues;Handout    Comprehension Verbalized understanding;Tactile cues required;Need further instruction;Returned demonstration;Verbal cues required               PT Long Term Goals - 05/26/20 1154      PT LONG TERM GOAL #1   Title Pt will improve FOTO score to predicted value    Time 8    Period Weeks    Status On-going      PT LONG TERM GOAL #2   Title Pt will improve Rt knee ROM to 0-120 deg    Time 8    Period Weeks    Status Partially Met      PT LONG TERM GOAL #3   Title Pt will improve 5TSTS test to 13 seconds and SLS to 5 sec to show improved balance    Time 8    Period Weeks    Status On-going      PT LONG TERM GOAL #4   Title Pt will be able to ambulate community distances without AD    Time 8    Period Weeks    Status On-going      PT LONG TERM GOAL #5   Title Pt will improve Rt knee strength to 5/5 and feel ready to return to gym.    Time 8    Period Weeks    Status On-going                 Plan - 05/26/20 1155    Clinical Impression Statement Danely has excellent AROM (0-109 degrees).  Strength, balance, gait quality (cane) and function will benefit from the recommended course of PT.    Examination-Activity Limitations Bend;Stairs;Sleep;Squat;Lift;Stand    Examination-Participation Restrictions Cleaning;Community Activity;Driving    Stability/Clinical Decision Making Stable/Uncomplicated    Rehab Potential Good    PT Frequency 2x / week    PT Duration 8 weeks    PT Treatment/Interventions ADLs/Self Care Home Management;Cryotherapy;Presenter, broadcasting;Therapeutic exercise;Iontophoresis 79m/ml Dexamethasone;Therapeutic activities;Neuromuscular re-education;Vestibular;Taping;Vasopneumatic Device;Dry needling;Passive range of motion  PT Next Visit Plan FOTO and set goal, review and update HEP, needs balance, gait, strength, ROM    PT Home Exercise Plan Access  Code: [email or web address]    Consulted and Agree with Plan of Care Patient           Patient will benefit from skilled therapeutic intervention in order to improve the following deficits and impairments:  Decreased activity tolerance,Decreased balance,Decreased endurance,Decreased mobility,Decreased range of motion,Decreased strength,Difficulty walking  Visit Diagnosis: Difficulty walking  Muscle weakness (generalized)  Localized edema  Stiffness of right knee, not elsewhere classified     Problem List Patient Active Problem List   Diagnosis Date Noted  . Status post revision of total replacement of right knee 05/07/2020  . Failed total knee, right, 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/2020  . History of total right knee replacement 01/28/2020  . Vitamin D deficiency 08/29/2018  . 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 Class 3 severe obesity with serious comorbidity and body mass index (BMI) of 40.0 to 44.9 in adult ([place]) 08/14/2018  . 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 Incontinence of urine in female 11/07/2017  . Vertigo 11/07/2017  . Left knee pain 10/25/2016  . Genetic testing 08/08/2016  . Hypothyroidism 12/02/2015  . Anxiety and depression 12/02/2015  . OAB (overactive bladder) 12/02/2015  . Breast cancer of upper-outer quadrant of left female breast ([place]) 12/02/2015  . Obesity (BMI 30-39.9) 12/02/2015    Farley Ly PT, MPT 05/26/2020, 5:51 PM  [facility] Physical Therapy [address] [place], [place], [address] Phone: [phone]   Fax:  [phone]  Name: Kavitha Lansdale MRN: [id number] Date of Birth: January 09, 1951

## 2020-05-26 NOTE — Patient Instructions (Signed)
Access Code: [email or web address] URL: [email or web address] Date: 05/26/2020 Prepared by: Vista Mink  Exercises Seated Hamstring Stretch - 2 x daily - 6 x weekly - 1 sets - 3 reps - 30 hold Seated Ankle Dorsiflexion Stretch - 2 x daily - 6 x weekly - 1 sets - 10 reps - 10 sec hold Seated Small Alternating Straight Leg Lifts with Heel Touch - 2 x daily - 6 x weekly - 2-3 sets - 10 reps Sit to Stand with Hands on Knees - 2 x daily - 6 x weekly - 1-2 sets - 10 reps Standing Single Leg Stance with Counter Support - 2 x daily - 6 x weekly - 1 sets - 3 reps - 30 sec hold Supine Quadricep Sets - 2-3 x daily - 7 x weekly - 1-2 sets - 10 reps - 5 second hold Small Range Straight Leg Raise - 2 x daily - 7 x weekly - 3-5 sets - 5 reps - 3 seconds hold Tandem Stance - 1-2 x daily - 7 x weekly - 1 sets - 4-5 reps - 20 second hold

## 2020-06-02 ENCOUNTER — Encounter: Payer: Self-pay | Admitting: Family Medicine

## 2020-06-03 ENCOUNTER — Encounter: Payer: Self-pay | Admitting: Family Medicine

## 2020-06-03 ENCOUNTER — Ambulatory Visit (INDEPENDENT_AMBULATORY_CARE_PROVIDER_SITE_OTHER): Payer: Medicare Other | Admitting: Family Medicine

## 2020-06-03 ENCOUNTER — Other Ambulatory Visit: Payer: Self-pay

## 2020-06-03 VITALS — BP 122/70 | HR 94 | Temp 98.1°F | Resp 18 | Ht 62.0 in | Wt 191.4 lb

## 2020-06-03 DIAGNOSIS — F419 Anxiety disorder, unspecified: Secondary | ICD-10-CM | POA: Diagnosis not present

## 2020-06-03 DIAGNOSIS — F32A Depression, unspecified: Secondary | ICD-10-CM | POA: Diagnosis not present

## 2020-06-03 MED ORDER — FLUOXETINE HCL 40 MG PO CAPS: 40.0000 mg | ORAL_CAPSULE | Freq: Every day | ORAL | 3 refills | Status: DC

## 2020-06-03 MED ORDER — TRAZODONE HCL 50 MG PO TABS
25.0000 mg | ORAL_TABLET | Freq: Every evening | ORAL | 3 refills | Status: DC | PRN
Start: 2020-06-03 — End: 2020-06-25

## 2020-06-03 NOTE — Assessment & Plan Note (Signed)
Deteriorated since knee surgery at the beginning of the month.  Low energy, low motivation, lack of enjoyment, increased irritability.  Poor sleep, poor appetite.  Will increase Prozac to 40mg  daily and start Trazodone nightly for sleep.  Will follow closely.  Pt expressed understanding and is in agreement w/ plan.

## 2020-06-03 NOTE — Patient Instructions (Signed)
Follow up in 1 month to recheck mood Increase the Prozac to 2 capsules daily for a total of 40mg  When you pick up the new prescription, it will be 1 capsule daily (40mg ) START the Trazodone nightly for sleep.  Start w/ 1/2 tab and increase to full tab if needed GIVE YOURSELF SOME TIME!!!! Call with any questions or concerns Stay Safe!  Stay Healthy! Happy New Year!!!

## 2020-06-03 NOTE — Progress Notes (Signed)
   Subjective:    Patient ID: Vanessa Christensen, female    DOB: 01/06/1951, 69 y.o.   MRN: [id number]  HPI Anxiety/Depression- deteriorated since recent knee surgery.  On Prozac 20mg  daily.  Decreased appetite.  'i'm just wiped out'.  Decreased interest, 'i'm not interested in anything'.  Difficulty sleeping.  Was started on Ambien but 'that made me looney tunes'.  Decreased patience.  Increased irritability.  Her priest told her that it was obvious she was 'in a deep depression'.   Review of Systems For ROS see HPI   This visit occurred during the SARS-CoV-2 public health emergency.  Safety protocols were in place, including screening questions prior to the visit, additional usage of staff PPE, and extensive cleaning of exam room while observing appropriate contact time as indicated for disinfecting solutions.       Objective:   Physical Exam Vitals reviewed.  Constitutional:      General: She is not in acute distress.    Appearance: Normal appearance. She is not ill-appearing.  HENT:     Head: Normocephalic and atraumatic.  Skin:    General: Skin is warm and dry.  Neurological:     General: No focal deficit present.     Mental Status: She is alert and oriented to person, place, and time.  Psychiatric:        Mood and Affect: Mood normal.        Behavior: Behavior normal.        Thought Content: Thought content normal.           Assessment & Plan:

## 2020-06-07 ENCOUNTER — Other Ambulatory Visit: Payer: Self-pay

## 2020-06-07 ENCOUNTER — Ambulatory Visit (INDEPENDENT_AMBULATORY_CARE_PROVIDER_SITE_OTHER): Payer: Medicare Other | Admitting: Rehabilitative and Restorative Service Providers"

## 2020-06-07 ENCOUNTER — Encounter: Payer: Self-pay | Admitting: Rehabilitative and Restorative Service Providers"

## 2020-06-07 DIAGNOSIS — M25561 Pain in right knee: Secondary | ICD-10-CM

## 2020-06-07 DIAGNOSIS — R600 Localized edema: Secondary | ICD-10-CM

## 2020-06-07 DIAGNOSIS — G8929 Other chronic pain: Secondary | ICD-10-CM

## 2020-06-07 DIAGNOSIS — M25661 Stiffness of right knee, not elsewhere classified: Secondary | ICD-10-CM

## 2020-06-07 DIAGNOSIS — R262 Difficulty in walking, not elsewhere classified: Secondary | ICD-10-CM

## 2020-06-07 DIAGNOSIS — M6281 Muscle weakness (generalized): Secondary | ICD-10-CM | POA: Diagnosis not present

## 2020-06-07 NOTE — Therapy (Signed)
[facility] Physical Therapy [address] [place], [place], [address] Phone: [phone]   Fax:  [phone]  Physical Therapy Treatment  Patient Details  Name: Vanessa Christensen MRN: [id number] Date of Birth: Jul 15, 1950 Referring Provider (PT): Mcarthur Rossetti, MD   Encounter Date: 06/07/2020   PT End of Session - 06/07/20 1144    Visit Number 3    Number of Visits 12    Date for PT Re-Evaluation 07/19/20    Progress Note Due on Visit 10    PT Start Time 1100    PT Stop Time 1154    PT Time Calculation (min) 54 min    Activity Tolerance Patient tolerated treatment well;No increased pain    Behavior During Therapy WFL for tasks assessed/performed           Past Medical History:  Diagnosis Date  . Anemia    hx of  . Anxiety    pt denies  . Arthritis    hands and feet  . Cancer ([place]) 10/04/2006   breast  . Cataracts, both eyes   . Depression   . Depression   . Diverticulosis of colon   . Glaucoma   . Heart murmur    No work up done 5 years ago  . Joint pain   . Knee pain   . Obesity   . OSA on CPAP   . Osteoarthritis   . Sleep apnea    no CPAP- no longer needed d/t weight loss  . Thyroid activity decreased     Past Surgical History:  Procedure Laterality Date  . BREAST SURGERY Bilateral   . CATARACT EXTRACTION    . COLONOSCOPY    . GLAUCOMA REPAIR    . MASTECTOMY, RADICAL Bilateral   . neulasta induced sterile abscesses    . THYROIDECTOMY, PARTIAL    . TONSILLECTOMY    . TOTAL KNEE ARTHROPLASTY Right   . TOTAL KNEE REVISION Right 05/07/2020   Procedure: RIGHT TOTAL KNEE REVISION ARTHROPLASTY;  Surgeon: Mcarthur Rossetti, MD;  Location: [facility];  Service: Orthopedics;  Laterality: Right;    There were no vitals filed for this visit.   Subjective Assessment - 06/07/20 1140    Subjective Sleeping is still going well.  She notes soreness and increased strength with new activities last visit.    Pertinent History s/p right  knee replacement revsision on 05/07/20, TKA was 7 years ago    Limitations Standing;Walking;House hold activities    How long can you stand comfortably? hour    How long can you walk comfortably? one hour    Patient Stated Goals get back to the gym, sleep through the night    Currently in Pain? Yes    Pain Score 4     Pain Location Knee    Pain Orientation Right    Pain Descriptors / Indicators Aching;Tightness;Sore    Pain Type Chronic pain;Surgical pain    Pain Onset More than a month ago    Pain Frequency Intermittent    Aggravating Factors  Prolonged postures and too much WB    Pain Relieving Factors Tylenol and ice    Effect of Pain on Daily Activities Needs a cane outside the home.  Slower with activities than she would like.  Limited WB endurance and stairs are difficult.                             [facility] Adult PT Treatment/Exercise -  06/07/20 0001      Therapeutic Activites    Therapeutic Activities ADL's    ADL's Step-up and over 8 minutes (slow eccentrics)      Neuro Re-ed    Neuro Re-ed Details  Tandem balance eyes open/closed 4X each  20 seconds      Exercises   Exercises Knee/Hip      Knee/Hip Exercises: Aerobic   Recumbent Bike Seat 4 Level 4 8 minutes      Knee/Hip Exercises: Seated   Long Arc Quad Strengthening;Both;3 sets;5 reps    Long Arc Quad Weight 0 lbs.    Long Arc Quad Limitations slow eccentrics    Sit to [employer] 2 sets;5 reps;without UE support   slow eccentrics     Knee/Hip Exercises: Supine   Quad Sets Strengthening;Both;1 set;10 reps   5 seconds     Modalities   Modalities Vasopneumatic      Vasopneumatic   Number Minutes Vasopneumatic  10 minutes    Vasopnuematic Location  Knee    Vasopneumatic Pressure Medium    Vasopneumatic Temperature  34                  PT Education - 06/07/20 1143    Education Details Reviewed HEP.    Person(s) Educated Patient    Methods Explanation;Demonstration;Tactile cues;Verbal  cues    Comprehension Verbalized understanding;Tactile cues required;Need further instruction;Returned demonstration;Verbal cues required               PT Long Term Goals - 06/07/20 1143      PT LONG TERM GOAL #1   Title Pt will improve FOTO score to predicted value    Time 8    Period Weeks    Status On-going      PT LONG TERM GOAL #2   Title Pt will improve Rt knee ROM to 0-120 deg    Time 8    Period Weeks    Status Partially Met      PT LONG TERM GOAL #3   Title Pt will improve 5TSTS test to 13 seconds and SLS to 5 sec to show improved balance    Time 8    Period Weeks    Status On-going      PT LONG TERM GOAL #4   Title Pt will be able to ambulate community distances without AD    Time 8    Period Weeks    Status On-going      PT LONG TERM GOAL #5   Title Pt will improve Rt knee strength to 5/5 and feel ready to return to gym.    Time 8    Period Weeks    Status On-going                 Plan - 06/07/20 1145    Clinical Impression Statement Aseret has excellent compliance with her early HEP.  She reports early progress with her subjective strength.  Fatigue with weight-bearing, late day edema and difficulty with stairs still limit function and require use of a cane outside the home.  Continue quadriceps strength emphasis.    Examination-Activity Limitations Bend;Stairs;Sleep;Squat;Lift;Stand    Examination-Participation Restrictions Cleaning;Community Activity;Driving    Stability/Clinical Decision Making Stable/Uncomplicated    Rehab Potential Good    PT Frequency 2x / week    PT Duration 8 weeks    PT Treatment/Interventions ADLs/Self Care Home Management;Cryotherapy;Presenter, broadcasting;Therapeutic exercise;Iontophoresis 38m/ml Dexamethasone;Therapeutic activities;Neuromuscular re-education;Vestibular;Taping;Vasopneumatic Device;Dry needling;Passive range of motion  PT Next Visit Plan Balance, gait,  quadriceps strength, ROM, stairs and prepare for gait without an AD    PT Home Exercise Plan Access Code: [email or web address]    Consulted and Agree with Plan of Care Patient           Patient will benefit from skilled therapeutic intervention in order to improve the following deficits and impairments:  Decreased activity tolerance,Decreased balance,Decreased endurance,Decreased mobility,Decreased range of motion,Decreased strength,Difficulty walking  Visit Diagnosis: Difficulty walking  Muscle weakness (generalized)  Localized edema  Stiffness of right knee, not elsewhere classified  Chronic pain of right knee     Problem List Patient Active Problem List   Diagnosis Date Noted  . Status post revision of total replacement of right knee 05/07/2020  . Failed total knee, right, 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/2020  . History of total right knee replacement 01/28/2020  . Vitamin D deficiency 08/29/2018  . 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 Class 3 severe obesity with serious comorbidity and body mass index (BMI) of 40.0 to 44.9 in adult ([place]) 08/14/2018  . 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 Incontinence of urine in female 11/07/2017  . Vertigo 11/07/2017  . Left knee pain 10/25/2016  . Genetic testing 08/08/2016  . Hypothyroidism 12/02/2015  . Anxiety and depression 12/02/2015  . OAB (overactive bladder) 12/02/2015  . Breast cancer of upper-outer quadrant of left female breast ([place]) 12/02/2015  . Obesity (BMI 30-39.9) 12/02/2015    Farley Ly 06/07/2020, 11:55 AM  [facility] Physical Therapy [address] [place], [place], [address] Phone: [phone]   Fax:  [phone]  Name: Vanessa Christensen MRN: [id number] Date of Birth: 03/16/1951

## 2020-06-08 ENCOUNTER — Telehealth: Payer: Self-pay | Admitting: Family Medicine

## 2020-06-08 NOTE — Telephone Encounter (Signed)
Left message for patient to schedule Annual Wellness Visit.  Please schedule with Nurse Health Advisor Martha Stanley, RN at [place]  

## 2020-06-09 ENCOUNTER — Ambulatory Visit (INDEPENDENT_AMBULATORY_CARE_PROVIDER_SITE_OTHER): Payer: Medicare Other | Admitting: Rehabilitative and Restorative Service Providers"

## 2020-06-09 ENCOUNTER — Other Ambulatory Visit: Payer: Self-pay

## 2020-06-09 ENCOUNTER — Encounter: Payer: Self-pay | Admitting: Rehabilitative and Restorative Service Providers"

## 2020-06-09 DIAGNOSIS — M25561 Pain in right knee: Secondary | ICD-10-CM

## 2020-06-09 DIAGNOSIS — M6281 Muscle weakness (generalized): Secondary | ICD-10-CM

## 2020-06-09 DIAGNOSIS — M25661 Stiffness of right knee, not elsewhere classified: Secondary | ICD-10-CM | POA: Diagnosis not present

## 2020-06-09 DIAGNOSIS — G8929 Other chronic pain: Secondary | ICD-10-CM

## 2020-06-09 DIAGNOSIS — R262 Difficulty in walking, not elsewhere classified: Secondary | ICD-10-CM | POA: Diagnosis not present

## 2020-06-09 DIAGNOSIS — R600 Localized edema: Secondary | ICD-10-CM

## 2020-06-09 NOTE — Therapy (Signed)
[facility] Physical Therapy [address] [place], [place], [address] Phone: [phone]   Fax:  [phone]  Physical Therapy Treatment  Patient Details  Name: Vanessa Christensen MRN: [id number] Date of Birth: [birth year] Referring Provider (PT): Vanessa Rossetti, MD   Encounter Date: 06/09/2020   PT End of Session - 06/09/20 1141    Visit Number 4    Number of Visits 12    Date for PT Re-Evaluation 07/19/20    Progress Note Due on Visit 10    PT Start Time 1100    PT Stop Time 1149    PT Time Calculation (min) 49 min    Activity Tolerance Patient tolerated treatment well;No increased pain    Behavior During Therapy WFL for tasks assessed/performed           Past Medical History:  Diagnosis Date  . Anemia    hx of  . Anxiety    pt denies  . Arthritis    hands and feet  . Cancer ([place]) 10/04/2006   breast  . Cataracts, both eyes   . Depression   . Depression   . Diverticulosis of colon   . Glaucoma   . Heart murmur    No work up done 5 years ago  . Joint pain   . Knee pain   . Obesity   . OSA on CPAP   . Osteoarthritis   . Sleep apnea    no CPAP- no longer needed d/t weight loss  . Thyroid activity decreased     Past Surgical History:  Procedure Laterality Date  . BREAST SURGERY Bilateral   . CATARACT EXTRACTION    . COLONOSCOPY    . GLAUCOMA REPAIR    . MASTECTOMY, RADICAL Bilateral   . neulasta induced sterile abscesses    . THYROIDECTOMY, PARTIAL    . TONSILLECTOMY    . TOTAL KNEE ARTHROPLASTY Right   . TOTAL KNEE REVISION Right 05/07/2020   Procedure: RIGHT TOTAL KNEE REVISION ARTHROPLASTY;  Surgeon: Vanessa Rossetti, MD;  Location: [facility];  Service: Orthopedics;  Laterality: Right;    There were no vitals filed for this visit.   Subjective Assessment - 06/09/20 1109    Subjective No problem since last visit.  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    Pertinent History s/p right knee replacement revsision on 05/07/20, TKA was 7 years ago    Limitations Standing;Walking;House hold activities    How long can you stand comfortably? hour    How long can you walk comfortably? one hour    Patient Stated Goals get back to the gym, sleep through the night    Currently in Pain? Yes    Pain Score 2     Pain Location Knee    Pain Orientation Right    Pain Descriptors / Indicators Aching;Sore;Tightness    Pain Type Chronic pain;Surgical pain    Pain Onset More than a month ago    Pain Frequency Intermittent    Aggravating Factors  Too much WB or too much sitting    Pain Relieving Factors Tylenol and ice    Effect of Pain on Daily Activities Using a cane (although less often).  Slower with activities than she would like.  Limited WB endurance and stairs are difficult.    Multiple Pain Sites No  [place] Adult PT Treatment/Exercise - 06/09/20 0001      Therapeutic Activites    Therapeutic Activities ADL's    ADL's Step-up and over 8 minutes (slow eccentrics) 4 & 6 inch step      Neuro Re-ed    Neuro Re-ed Details  Tandem balance eyes open/closed 4X each  20 seconds      Exercises   Exercises Knee/Hip      Knee/Hip Exercises: Aerobic   Recumbent Bike Seat 4 Level 4 8 minutes      Knee/Hip Exercises: Machines for Strengthening   Cybex Leg Press 75# 20X slow eccentrics      Knee/Hip Exercises: Seated   Long Arc Quad --    Long Arc [employer] --    Long Arc Quad Limitations --   HEP   Sit to [employer] Other (comment)   HEP     Knee/Hip Exercises: Supine   Quad Sets --   HEP     Modalities   Modalities Vasopneumatic      Vasopneumatic   Number Minutes Vasopneumatic  10 minutes    Vasopnuematic Location  Knee    Vasopneumatic Pressure Medium    Vasopneumatic Temperature  34                  PT Education - 06/09/20 1140    Education Details Progressed stairs and  added leg press.    Person(s) Educated Patient    Methods Explanation;Demonstration;Verbal cues    Comprehension Verbalized understanding;Returned demonstration;Need further instruction;Verbal cues required               PT Long Term Goals - 06/09/20 1140      PT LONG TERM GOAL #1   Title Pt will improve FOTO score to predicted value    Time 8    Period Weeks    Status On-going      PT LONG TERM GOAL #2   Title Pt will improve Rt knee ROM to 0-120 deg    Time 8    Period Weeks    Status Partially Met      PT LONG TERM GOAL #3   Title Pt will improve 5TSTS test to 13 seconds and SLS to 5 sec to show improved balance    Time 8    Period Weeks    Status On-going      PT LONG TERM GOAL #4   Title Pt will be able to ambulate community distances without AD    Time 8    Period Weeks    Status On-going      PT LONG TERM GOAL #5   Title Pt will improve Rt knee strength to 5/5 and feel ready to return to gym.    Time 8    Period Weeks    Status On-going                 Plan - 06/09/20 1142    Clinical Impression Statement Vanessa Christensen walked into PT today without a cane.  Quadriceps strength remains a high priority to return Vanessa Christensen to her previous level of function.    Examination-Activity Limitations Bend;Stairs;Sleep;Squat;Lift;Stand    Examination-Participation Restrictions Cleaning;Community Activity;Driving    Stability/Clinical Decision Making Stable/Uncomplicated    Rehab Potential Good    PT Frequency 2x / week    PT Duration 8 weeks    PT Treatment/Interventions ADLs/Self Care Home Management;Cryotherapy;Presenter, broadcasting;Therapeutic exercise;Iontophoresis 53m/ml Dexamethasone;Therapeutic activities;Neuromuscular re-education;Vestibular;Taping;Vasopneumatic Device;Dry needling;Passive range of motion  PT Next Visit Plan Balance, gait, quadriceps strength, ROM, stairs and prepare for gait without an AD    PT Home  Exercise Plan Access Code: [email or web address]    Consulted and Agree with Plan of Care Patient           Patient will benefit from skilled therapeutic intervention in order to improve the following deficits and impairments:  Decreased activity tolerance,Decreased balance,Decreased endurance,Decreased mobility,Decreased range of motion,Decreased strength,Difficulty walking  Visit Diagnosis: Difficulty walking  Muscle weakness (generalized)  Localized edema  Stiffness of right knee, not elsewhere classified  Chronic pain of right knee     Problem List Patient Active Problem List   Diagnosis Date Noted  . Status post revision of total replacement of right knee 05/07/2020  . Failed total knee, right, 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/2020  . History of total right knee replacement 01/28/2020  . Vitamin D deficiency 08/29/2018  . 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 Class 3 severe obesity with serious comorbidity and body mass index (BMI) of 40.0 to 44.9 in adult ([place]) 08/14/2018  . 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 Incontinence of urine in female 11/07/2017  . Vertigo 11/07/2017  . Left knee pain 10/25/2016  . Genetic testing 08/08/2016  . Hypothyroidism 12/02/2015  . Anxiety and depression 12/02/2015  . OAB (overactive bladder) 12/02/2015  . Breast cancer of upper-outer quadrant of left female breast ([place]) 12/02/2015  . Obesity (BMI 30-39.9) 12/02/2015    Farley Ly PT, MPT 06/09/2020, 11:47 AM  [facility] Physical Therapy [address] [place], [place], [address] Phone: [phone]   Fax:  [phone]  Name: Vanessa Christensen MRN: [id number] Date of Birth: [birth year]

## 2020-06-14 ENCOUNTER — Ambulatory Visit (INDEPENDENT_AMBULATORY_CARE_PROVIDER_SITE_OTHER): Payer: Medicare Other | Admitting: Physical Therapy

## 2020-06-14 ENCOUNTER — Encounter: Payer: Self-pay | Admitting: Physical Therapy

## 2020-06-14 ENCOUNTER — Other Ambulatory Visit: Payer: Self-pay

## 2020-06-14 DIAGNOSIS — G8929 Other chronic pain: Secondary | ICD-10-CM

## 2020-06-14 DIAGNOSIS — M6281 Muscle weakness (generalized): Secondary | ICD-10-CM | POA: Diagnosis not present

## 2020-06-14 DIAGNOSIS — R600 Localized edema: Secondary | ICD-10-CM

## 2020-06-14 DIAGNOSIS — R2689 Other abnormalities of gait and mobility: Secondary | ICD-10-CM

## 2020-06-14 DIAGNOSIS — M25661 Stiffness of right knee, not elsewhere classified: Secondary | ICD-10-CM

## 2020-06-14 DIAGNOSIS — R262 Difficulty in walking, not elsewhere classified: Secondary | ICD-10-CM | POA: Diagnosis not present

## 2020-06-14 DIAGNOSIS — M25561 Pain in right knee: Secondary | ICD-10-CM

## 2020-06-14 NOTE — Therapy (Signed)
[facility] Physical Therapy [address] [place], [place], [address] Phone: [phone]   Fax:  [phone]  Physical Therapy Treatment/Progress note Progress Note reporting period 05/24/20 to 06/14/20  See below for objective and subjective measurements relating to patients progress with PT.   Patient Details  Name: Vanessa Christensen MRN: [id number] Date of Birth: 12-26-1950 Referring Provider (PT): Mcarthur Rossetti, MD   Encounter Date: 06/14/2020   PT End of Session - 06/14/20 1231    Visit Number 5    Number of Visits 12    Date for PT Re-Evaluation 07/19/20    Progress Note Due on Visit 15    PT Start Time 1150    PT Stop Time 3143    PT Time Calculation (min) 45 min    Activity Tolerance Patient tolerated treatment well;No increased pain    Behavior During Therapy WFL for tasks assessed/performed           Past Medical History:  Diagnosis Date  . Anemia    hx of  . Anxiety    pt denies  . Arthritis    hands and feet  . Cancer ([place]) 10/04/2006   breast  . Cataracts, both eyes   . Depression   . Depression   . Diverticulosis of colon   . Glaucoma   . Heart murmur    No work up done 5 years ago  . Joint pain   . Knee pain   . Obesity   . OSA on CPAP   . Osteoarthritis   . Sleep apnea    no CPAP- no longer needed d/t weight loss  . Thyroid activity decreased     Past Surgical History:  Procedure Laterality Date  . BREAST SURGERY Bilateral   . CATARACT EXTRACTION    . COLONOSCOPY    . GLAUCOMA REPAIR    . MASTECTOMY, RADICAL Bilateral   . neulasta induced sterile abscesses    . THYROIDECTOMY, PARTIAL    . TONSILLECTOMY    . TOTAL KNEE ARTHROPLASTY Right   . TOTAL KNEE REVISION Right 05/07/2020   Procedure: RIGHT TOTAL KNEE REVISION ARTHROPLASTY;  Surgeon: Mcarthur Rossetti, MD;  Location: [facility];  Service: Orthopedics;  Laterality: Right;    There were no vitals filed for this visit.   Subjective Assessment -  06/14/20 1153    Subjective relays not having pain upon arrival, stiffness is her biggest complaint    Pertinent History s/p right knee replacement revsision on 05/07/20, TKA was 7 years ago    Limitations Standing;Walking;House hold activities    How long can you stand comfortably? hour    How long can you walk comfortably? one hour    Patient Stated Goals get back to the gym, sleep through the night    Pain Onset More than a month ago              [facility] PT Assessment - 06/14/20 0001      Assessment   Medical Diagnosis s/p right knee replacement revsision on 05/07/20, TKA was 7 years ago    Referring Provider (PT) Mcarthur Rossetti, MD    Onset Date/Surgical Date 05/07/20    Next MD Visit 06/18/19      AROM   Right Knee Extension -2    Right Knee Flexion 120      Strength   Overall Strength Comments Rt hip/knee 4+  [place] Adult PT Treatment/Exercise - 06/14/20 0001      Neuro Re-ed    Neuro Re-ed Details  SLS cone taps, cone 3 way steps  X5 ea      Exercises   Exercises Knee/Hip      Knee/Hip Exercises: Stretches   Active Hamstring Stretch Right;30 seconds;2 reps    Knee: Self-Stretch Limitations --      Knee/Hip Exercises: Aerobic   Recumbent Bike Seat 4 Level 4 8 minutes      Knee/Hip Exercises: Machines for Strengthening   Cybex Leg Press 75# 20X slow eccentrics      Knee/Hip Exercises: Standing   Lateral Step Up Both;10 reps;Step Height: 6"    Other Standing Knee Exercises fwd step ups and step downs, up with Rt, down with left  X10 reps 6 inch      Modalities   Modalities Vasopneumatic      Vasopneumatic   Number Minutes Vasopneumatic  10 minutes    Vasopnuematic Location  Knee    Vasopneumatic Pressure Medium    Vasopneumatic Temperature  34                       PT Long Term Goals - 06/14/20 1238      PT LONG TERM GOAL #1   Title Pt will improve FOTO score to predicted value    Baseline  next visit    Time 8    Period Weeks    Status On-going      PT LONG TERM GOAL #2   Title Pt will improve Rt knee ROM to 0-120 deg    Baseline 2-120    Time 8    Period Weeks    Status Partially Met      PT LONG TERM GOAL #3   Title Pt will improve 5TSTS test to 13 seconds and SLS to 5 sec to show improved balance    Baseline next visit    Time 8    Period Weeks    Status On-going      PT LONG TERM GOAL #4   Title Pt will be able to ambulate community distances without AD    Time 8    Period Weeks    Status Achieved      PT LONG TERM GOAL #5   Title Pt will improve Rt knee strength to 5/5 and feel ready to return to gym.    Baseline 4+    Time 8    Period Weeks    Status On-going                 Plan - 06/14/20 1234    Clinical Impression Statement She has progressed well with PT, see updated measurements. She is on track to meet all of her goals, but will continue to benefit from PT for more strength and extension.    Examination-Activity Limitations Bend;Stairs;Sleep;Squat;Lift;Stand    Examination-Participation Restrictions Cleaning;Community Activity;Driving    Stability/Clinical Decision Making Stable/Uncomplicated    Rehab Potential Good    PT Frequency 2x / week    PT Duration 8 weeks    PT Treatment/Interventions ADLs/Self Care Home Management;Cryotherapy;Presenter, broadcasting;Therapeutic exercise;Iontophoresis 63m/ml Dexamethasone;Therapeutic activities;Neuromuscular re-education;Vestibular;Taping;Vasopneumatic Device;Dry needling;Passive range of motion    PT Next Visit Plan Balance, gait, quadriceps strength, extension, what did MD say?    PT Home Exercise Plan Access Code: [email or web address]   Consulted and Agree with Plan of Care Patient  Patient will benefit from skilled therapeutic intervention in order to improve the following deficits and impairments:  Decreased activity tolerance,Decreased  balance,Decreased endurance,Decreased mobility,Decreased range of motion,Decreased strength,Difficulty walking  Visit Diagnosis: Difficulty walking  Muscle weakness (generalized)  Localized edema  Stiffness of right knee, not elsewhere classified  Chronic pain of right knee  Acute pain of right knee  Other abnormalities of gait and mobility     Problem List Patient Active Problem List   Diagnosis Date Noted  . Status post revision of total replacement of right knee 05/07/2020  . Failed total knee, right, 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/2020  . History of total right knee replacement 01/28/2020  . Vitamin D deficiency 08/29/2018  . 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 Class 3 severe obesity with serious comorbidity and body mass index (BMI) of 40.0 to 44.9 in adult ([place]) 08/14/2018  . 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 Incontinence of urine in female 11/07/2017  . Vertigo 11/07/2017  . Left knee pain 10/25/2016  . Genetic testing 08/08/2016  . Hypothyroidism 12/02/2015  . Anxiety and depression 12/02/2015  . OAB (overactive bladder) 12/02/2015  . Breast cancer of upper-outer quadrant of left female breast ([place]) 12/02/2015  . Obesity (BMI 30-39.9) 12/02/2015    Silvestre Mesi 06/14/2020, 12:40 PM  [facility] Physical Therapy [address] [place], [place], [address] Phone: [phone]   Fax:  [phone]  Name: Vanessa Christensen MRN: [id number] Date of Birth: [birth year]

## 2020-06-15 ENCOUNTER — Encounter (INDEPENDENT_AMBULATORY_CARE_PROVIDER_SITE_OTHER): Payer: Self-pay | Admitting: Bariatrics

## 2020-06-15 ENCOUNTER — Ambulatory Visit (INDEPENDENT_AMBULATORY_CARE_PROVIDER_SITE_OTHER): Payer: Medicare Other | Admitting: Bariatrics

## 2020-06-15 VITALS — BP 144/81 | HR 90 | Temp 97.9°F | Ht 62.0 in | Wt 195.0 lb

## 2020-06-15 DIAGNOSIS — G479 Sleep disorder, unspecified: Secondary | ICD-10-CM

## 2020-06-15 DIAGNOSIS — Z6835 Body mass index (BMI) 35.0-35.9, adult: Secondary | ICD-10-CM | POA: Diagnosis not present

## 2020-06-16 ENCOUNTER — Encounter: Payer: Self-pay | Admitting: Physical Therapy

## 2020-06-16 ENCOUNTER — Ambulatory Visit (INDEPENDENT_AMBULATORY_CARE_PROVIDER_SITE_OTHER): Payer: Medicare Other | Admitting: Physical Therapy

## 2020-06-16 ENCOUNTER — Other Ambulatory Visit: Payer: Self-pay

## 2020-06-16 DIAGNOSIS — R2689 Other abnormalities of gait and mobility: Secondary | ICD-10-CM

## 2020-06-16 DIAGNOSIS — M25661 Stiffness of right knee, not elsewhere classified: Secondary | ICD-10-CM

## 2020-06-16 DIAGNOSIS — M6281 Muscle weakness (generalized): Secondary | ICD-10-CM | POA: Diagnosis not present

## 2020-06-16 DIAGNOSIS — G8929 Other chronic pain: Secondary | ICD-10-CM

## 2020-06-16 DIAGNOSIS — R600 Localized edema: Secondary | ICD-10-CM

## 2020-06-16 DIAGNOSIS — M25561 Pain in right knee: Secondary | ICD-10-CM

## 2020-06-16 DIAGNOSIS — R262 Difficulty in walking, not elsewhere classified: Secondary | ICD-10-CM

## 2020-06-16 NOTE — Therapy (Signed)
[facility] Physical Therapy [address] [place], [place], [address] Phone: [phone]   Fax:  [phone]  Physical Therapy Treatment  Patient Details  Name: Vanessa Christensen MRN: [id number] Date of Birth: 11-21-1950 Referring Provider (PT): Mcarthur Rossetti, MD   Encounter Date: 06/16/2020   PT End of Session - 06/16/20 1232    Visit Number 6    Number of Visits 12    Date for PT Re-Evaluation 07/19/20    Progress Note Due on Visit 15    PT Start Time 2694    PT Stop Time 1233    PT Time Calculation (min) 48 min    Activity Tolerance Patient tolerated treatment well;No increased pain    Behavior During Therapy WFL for tasks assessed/performed           Past Medical History:  Diagnosis Date  . Anemia    hx of  . Anxiety    pt denies  . Arthritis    hands and feet  . Cancer ([place]) 10/04/2006   breast  . Cataracts, both eyes   . Depression   . Depression   . Diverticulosis of colon   . Glaucoma   . Heart murmur    No work up done 5 years ago  . Joint pain   . Knee pain   . Obesity   . OSA on CPAP   . Osteoarthritis   . Sleep apnea    no CPAP- no longer needed d/t weight loss  . Thyroid activity decreased     Past Surgical History:  Procedure Laterality Date  . BREAST SURGERY Bilateral   . CATARACT EXTRACTION    . COLONOSCOPY    . GLAUCOMA REPAIR    . MASTECTOMY, RADICAL Bilateral   . neulasta induced sterile abscesses    . THYROIDECTOMY, PARTIAL    . TONSILLECTOMY    . TOTAL KNEE ARTHROPLASTY Right   . TOTAL KNEE REVISION Right 05/07/2020   Procedure: RIGHT TOTAL KNEE REVISION ARTHROPLASTY;  Surgeon: Mcarthur Rossetti, MD;  Location: [facility];  Service: Orthopedics;  Laterality: Right;    There were no vitals filed for this visit.   Subjective Assessment - 06/16/20 1231    Subjective relays some soreness in her hips/thighs after last session but only for a day, she denies pain today upon arrival.    Pertinent History  s/p right knee replacement revsision on 05/07/20, TKA was 7 years ago    Limitations Standing;Walking;House hold activities    How long can you stand comfortably? hour    How long can you walk comfortably? one hour    Patient Stated Goals get back to the gym, sleep through the night    Pain Onset More than a month ago           [facility] Adult PT Treatment/Exercise - 06/16/20 0001      Neuro Re-ed    Neuro Re-ed Details  balance on foam feet together 30 sec progressed to mod tandem on foam 30 sec X 2 bilat, then alt marches on foam  X15 bilat      Knee/Hip Exercises: Stretches   Active Hamstring Stretch Right;30 seconds;3 reps      Knee/Hip Exercises: Aerobic   Recumbent Bike Seat 4 Level 4 10 minutes      Knee/Hip Exercises: Machines for Strengthening   Cybex Knee Extension 5 lbs 2X15    Cybex Knee Flexion 20 lbs 2 sets of 10    Cybex Leg Press 75# 2X15  slow eccentrics      Knee/Hip Exercises: Standing   Lateral Step Up Right;10 reps;Hand Hold: 1;Step Height: 6"    Forward Step Up Limitations step up with Rt and down with left 10 reps 6 inch step                       PT Long Term Goals - 06/14/20 1238      PT LONG TERM GOAL #1   Title Pt will improve FOTO score to predicted value    Baseline next visit    Time 8    Period Weeks    Status On-going      PT LONG TERM GOAL #2   Title Pt will improve Rt knee ROM to 0-120 deg    Baseline 2-120    Time 8    Period Weeks    Status Partially Met      PT LONG TERM GOAL #3   Title Pt will improve 5TSTS test to 13 seconds and SLS to 5 sec to show improved balance    Baseline next visit    Time 8    Period Weeks    Status On-going      PT LONG TERM GOAL #4   Title Pt will be able to ambulate community distances without AD    Time 8    Period Weeks    Status Achieved      PT LONG TERM GOAL #5   Title Pt will improve Rt knee strength to 5/5 and feel ready to return to gym.    Baseline 4+    Time 8    Period  Weeks    Status On-going                 Plan - 06/16/20 1233    Clinical Impression Statement Continued with strength progression and she had good overall tolerance to this. PT will continue to progress balance and strength as able.    Examination-Activity Limitations Bend;Stairs;Sleep;Squat;Lift;Stand    Examination-Participation Restrictions Cleaning;Community Activity;Driving    Stability/Clinical Decision Making Stable/Uncomplicated    Rehab Potential Good    PT Frequency 2x / week    PT Duration 8 weeks    PT Treatment/Interventions ADLs/Self Care Home Management;Cryotherapy;Presenter, broadcasting;Therapeutic exercise;Iontophoresis 68m/ml Dexamethasone;Therapeutic activities;Neuromuscular re-education;Vestibular;Taping;Vasopneumatic Device;Dry needling;Passive range of motion    PT Next Visit Plan Balance, gait, quadriceps strength    PT Home Exercise Plan Access Code: [email or web address]   Consulted and Agree with Plan of Care Patient           Patient will benefit from skilled therapeutic intervention in order to improve the following deficits and impairments:  Decreased activity tolerance,Decreased balance,Decreased endurance,Decreased mobility,Decreased range of motion,Decreased strength,Difficulty walking  Visit Diagnosis: Difficulty walking  Muscle weakness (generalized)  Localized edema  Stiffness of right knee, not elsewhere classified  Chronic pain of right knee  Acute pain of right knee  Other abnormalities of gait and mobility     Problem List Patient Active Problem List   Diagnosis Date Noted  . Status post revision of total replacement of right knee 05/07/2020  . Failed total knee, right, 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/2020  . History of total right knee replacement 01/28/2020  . Vitamin D deficiency 08/29/2018  . 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 Class 3 severe obesity with serious comorbidity and body mass index  (BMI) of 40.0 to 44.9 in adult ([place] 08/14/2018  . 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  Incontinence of urine in female 11/07/2017  . Vertigo 11/07/2017  . Left knee pain 10/25/2016  . Genetic testing 08/08/2016  . Hypothyroidism 12/02/2015  . Anxiety and depression 12/02/2015  . OAB (overactive bladder) 12/02/2015  . Breast cancer of upper-outer quadrant of left female breast ([place]) 12/02/2015  . Obesity (BMI 30-39.9) 12/02/2015    Silvestre Mesi 06/16/2020, 12:38 PM  [facility] Physical Therapy [address] [place], [place], [address] Phone: [phone]   Fax:  [phone]  Name: Lakiya Cottam MRN: [id number] Date of Birth: 1951-04-24

## 2020-06-17 ENCOUNTER — Ambulatory Visit (INDEPENDENT_AMBULATORY_CARE_PROVIDER_SITE_OTHER): Payer: Medicare Other | Admitting: Orthopaedic Surgery

## 2020-06-17 ENCOUNTER — Encounter: Payer: Self-pay | Admitting: Orthopaedic Surgery

## 2020-06-17 DIAGNOSIS — Z96651 Presence of right artificial knee joint: Secondary | ICD-10-CM

## 2020-06-17 NOTE — Progress Notes (Signed)
The patient is now 6 weeks status post a revision arthroplasty of a failed tibial component of a total knee. She is having great range of motion and strength with going to physical therapy. She has at least 2 more weeks of physical therapy and I agree with her doing this because she has made such a great progress. She is walking without assistive device. She is very pleased herself.  On examination her right knee does have still swelling to be expected. The incision looks good. Her range of motion is fairly good from flexion and extension and the knee feels stable.  From my standpoint, she will continue therapy for least 2 more weeks and that can be extended if she needs it. I do not need to see her back for 3 months. At that visit I like a standing AP and lateral of her right knee. If there is any issues before then they will let us know. All questions and concerns were answered and addressed.

## 2020-06-21 ENCOUNTER — Encounter: Payer: Medicare Other | Admitting: Physical Therapy

## 2020-06-21 NOTE — Progress Notes (Signed)
Chief Complaint:   OBESITY Vanessa Christensen is here to discuss her progress with her obesity treatment plan along with follow-up of her obesity related diagnoses. Vanessa Christensen is on the Category 4 Plan and states she is following her eating plan approximately 50% of the time. Vanessa Christensen states she is doing PT for 45 minutes 2 times per week.  Today's visit was #: 75 Starting weight: 241 lbs Starting date: 08/13/2018 Today's weight: 195 lbs Today's date: 06/15/2020 Total lbs lost to date: 46 lbs Total lbs lost since last in-office visit: 0  Interim History: Vanessa Christensen is up an additional 3 pounds since her last visit.  She is status post revision of her right knee.  Subjective:   1. Sleep disorder Improved.  She stopped taking Ambien and is now taking trazodone.  Assessment/Plan:   1. Sleep disorder Vanessa Christensen with PCP.  2. Class 2 severe obesity with serious comorbidity and body mass index (BMI) of 35.0 to 35.9 in adult, unspecified obesity type Vanessa Christensen)  [name] is currently in the action stage of change. As such, her goal is to continue with weight loss efforts. She has agreed to the Category 3 Plan and the [place].   Vanessa Christensen will be more adherent to the plan, will go back to her original routine, and will not skip meals.  Exercise goals: Still going to PT.  Improving.  Will see the orthopedist this week.  Riding recumbent bike.  Behavioral modification strategies: increasing lean protein intake, decreasing simple carbohydrates, increasing vegetables, increasing water intake, decreasing eating out, no skipping meals, meal planning and cooking strategies, keeping healthy foods in the home and planning for success.  Vanessa Christensen has agreed to follow-up with our clinic in 2-3 weeks. She was informed of the importance of frequent follow-up visits to maximize her success with intensive lifestyle modifications for her multiple health conditions.   Objective:   Blood pressure (!) 144/81, pulse 90,  temperature 97.9 F (36.6 C), height 5\' 2"  (1.575 m), weight 195 lb (88.5 kg), SpO2 96 %. Body mass index is 35.67 kg/m.  General: Cooperative, alert, well developed, in no acute distress. HEENT: Conjunctivae and lids unremarkable. Cardiovascular: Regular rhythm.  Lungs: Normal work of breathing. Neurologic: No focal deficits.   Lab Results  Component Value Date   CREATININE 0.51 05/08/2020   BUN 12 05/08/2020   NA 134 (L) 05/08/2020   K 3.5 05/08/2020   CL 101 05/08/2020   CO2 21 (L) 05/08/2020   Lab Results  Component Value Date   ALT 19 01/15/2020   AST 18 01/15/2020   ALKPHOS 71 01/15/2020   BILITOT 0.3 01/15/2020   Lab Results  Component Value Date   HGBA1C 5.6 01/15/2020   HGBA1C 5.4 11/04/2018   HGBA1C 5.6 08/13/2018   Lab Results  Component Value Date   INSULIN 8.0 01/15/2020   INSULIN 10.9 11/04/2018   INSULIN 13.2 08/13/2018   Lab Results  Component Value Date   TSH 1.670 01/15/2020   Lab Results  Component Value Date   CHOL 188 01/15/2020   HDL 52 01/15/2020   LDLCALC 124 (H) 01/15/2020   TRIG 63 01/15/2020   CHOLHDL 4 05/08/2018   Lab Results  Component Value Date   WBC 11.4 (H) 05/08/2020   HGB 11.3 (L) 05/08/2020   HCT 35.8 (L) 05/08/2020   MCV 93.5 05/08/2020   PLT 161 05/08/2020   Obesity Behavioral Intervention:   Approximately 15 minutes were spent on the discussion below.  ASK: We discussed  the diagnosis of obesity with Vanessa Christensen today and Vanessa Christensen agreed to give [name] permission to discuss obesity behavioral modification therapy today.  ASSESS: Vanessa Christensen has the diagnosis of obesity and her BMI today is 35.8. Vanessa Christensen is in the action stage of change.   ADVISE: Vanessa Christensen was educated on the multiple health risks of obesity as well as the benefit of weight loss to improve her health. She was advised of the need for long term treatment and the importance of lifestyle modifications to improve her current health and to decrease her risk of future  health problems.  AGREE: Multiple dietary modification options and treatment options were discussed and Vanessa Christensen agreed to follow the recommendations documented in the above note.  ARRANGE: Vanessa Christensen was educated on the importance of frequent visits to treat obesity as outlined per CMS and USPSTF guidelines and agreed to schedule her next follow up appointment today.  Attestation Statements:   Reviewed by clinician on day of visit: allergies, medications, problem list, medical history, surgical history, family history, social history, and previous encounter notes.  I, Water quality scientist, CMA, am acting as Location manager for [employer], DO  I have reviewed the above documentation for accuracy and completeness, and I agree with the above. Jearld Lesch, DO

## 2020-06-22 ENCOUNTER — Encounter (INDEPENDENT_AMBULATORY_CARE_PROVIDER_SITE_OTHER): Payer: Self-pay | Admitting: Bariatrics

## 2020-06-23 ENCOUNTER — Encounter: Payer: Self-pay | Admitting: Physical Therapy

## 2020-06-23 ENCOUNTER — Other Ambulatory Visit: Payer: Self-pay

## 2020-06-23 ENCOUNTER — Ambulatory Visit (INDEPENDENT_AMBULATORY_CARE_PROVIDER_SITE_OTHER): Payer: Medicare Other | Admitting: Physical Therapy

## 2020-06-23 DIAGNOSIS — R2689 Other abnormalities of gait and mobility: Secondary | ICD-10-CM | POA: Diagnosis not present

## 2020-06-23 DIAGNOSIS — R600 Localized edema: Secondary | ICD-10-CM | POA: Diagnosis not present

## 2020-06-23 DIAGNOSIS — M25661 Stiffness of right knee, not elsewhere classified: Secondary | ICD-10-CM

## 2020-06-23 DIAGNOSIS — G8929 Other chronic pain: Secondary | ICD-10-CM

## 2020-06-23 DIAGNOSIS — R262 Difficulty in walking, not elsewhere classified: Secondary | ICD-10-CM

## 2020-06-23 DIAGNOSIS — M6281 Muscle weakness (generalized): Secondary | ICD-10-CM | POA: Diagnosis not present

## 2020-06-23 DIAGNOSIS — M25561 Pain in right knee: Secondary | ICD-10-CM

## 2020-06-23 NOTE — Therapy (Signed)
Physical Therapy Treatment  [facility] [address] [place], [place], [address] Phone: [phone]   Fax:  [phone]      Patient Details  Name: Vanessa Christensen MRN: [id number] Date of Birth: 1950-12-31 Referring Provider (PT): Mcarthur Rossetti, MD   Encounter Date: 06/23/2020   PT End of Session - 06/23/20 1120    Visit Number 7    Number of Visits 12    Date for PT Re-Evaluation 07/19/20    Progress Note Due on Visit 15    PT Start Time 1120    PT Stop Time 1200    PT Time Calculation (min) 40 min    Activity Tolerance Patient tolerated treatment well;No increased pain    Behavior During Therapy WFL for tasks assessed/performed           Past Medical History:  Diagnosis Date   Anemia    hx of   Anxiety    pt denies   Arthritis    hands and feet   Cancer ([place]) 10/04/2006   breast   Cataracts, both eyes    Depression    Depression    Diverticulosis of colon    Glaucoma    Heart murmur    No work up done 5 years ago   Joint pain    Knee pain    Obesity    OSA on CPAP    Osteoarthritis    Sleep apnea    no CPAP- no longer needed d/t weight loss   Thyroid activity decreased     Past Surgical History:  Procedure Laterality Date   BREAST SURGERY Bilateral    CATARACT EXTRACTION     COLONOSCOPY     GLAUCOMA REPAIR     MASTECTOMY, RADICAL Bilateral    neulasta induced sterile abscesses     THYROIDECTOMY, PARTIAL     TONSILLECTOMY     TOTAL KNEE ARTHROPLASTY Right    TOTAL KNEE REVISION Right 05/07/2020   Procedure: RIGHT TOTAL KNEE REVISION ARTHROPLASTY;  Surgeon: Mcarthur Rossetti, MD;  Location: [facility];  Service: Orthopedics;  Laterality: Right;    There were no vitals filed for this visit.   Subjective Assessment - 06/23/20 1120    Subjective She saw Dr. Ninfa Linden who is pleased with her progress.    Pertinent History s/p right knee replacement revsision on 05/07/20, TKA  was 7 years ago    Limitations Standing;Walking;House hold activities    How long can you stand comfortably? hour    How long can you walk comfortably? one hour    Patient Stated Goals get back to the gym, sleep through the night    Currently in Pain? Yes    Pain Score 2     Pain Location Knee    Pain Orientation Right    Pain Descriptors / Indicators Aching;Sore    Pain Type Acute pain    Pain Onset More than a month ago    Pain Frequency Intermittent    Aggravating Factors  increasing activity level outside in cold,    Pain Relieving Factors tylenol            [facility] Adult PT Treatment/Exercise - 06/23/20 1120      Neuro Re-ed    Neuro Re-ed Details  balance on foam feet together 30 sec progressed to mod tandem on foam 30 sec X 2 bilat, then alt marches on foam  X15 bilat      Knee/Hip Exercises: Stretches   Active  Hamstring Stretch Right;30 seconds;3 reps    Active Hamstring Stretch Limitations supine SLR with strap      Knee/Hip Exercises: Aerobic   Recumbent Bike Seat 4 Level 4 10 minutes      Knee/Hip Exercises: Machines for Strengthening   Cybex Knee Extension 5 lbs 2X15    Cybex Knee Flexion 20 lbs 2 sets of 15    Cybex Leg Press 75# 2X15 slow eccentrics      Knee/Hip Exercises: Standing   Lateral Step Up Right;Hand Hold: 1;Step Height: 6";15 reps    Forward Step Up Limitations step up with Rt and down with left 15 reps 6 inch step, intermittent UE support    Other Standing Knee Exercises mini squats and mini lunges X 5 ea             PT Long Term Goals - 06/14/20 1238      PT LONG TERM GOAL #1   Title Pt will improve FOTO score to predicted value    Baseline next visit    Time 8    Period Weeks    Status On-going      PT LONG TERM GOAL #2   Title Pt will improve Rt knee ROM to 0-120 deg    Baseline 2-120    Time 8    Period Weeks    Status Partially Met      PT LONG TERM GOAL #3   Title Pt will improve 5TSTS test to 13 seconds and SLS to 5 sec to  show improved balance    Baseline next visit    Time 8    Period Weeks    Status On-going      PT LONG TERM GOAL #4   Title Pt will be able to ambulate community distances without AD    Time 8    Period Weeks    Status Achieved      PT LONG TERM GOAL #5   Title Pt will improve Rt knee strength to 5/5 and feel ready to return to gym.    Baseline 4+    Time 8    Period Weeks    Status On-going                 Plan - 06/23/20 1126    Clinical Impression Statement She is progressing well and no complaints with strength progression. She wants to resume workouts with her exercise group so reviewed exercises that are appropriate and which ones to be cautious with and the need to modify into mini ROM with lunges or squats and limit reps to her tolerance with this.    Examination-Activity Limitations Bend;Stairs;Sleep;Squat;Lift;Stand    Examination-Participation Restrictions Cleaning;Community Activity;Driving    Stability/Clinical Decision Making Stable/Uncomplicated    Rehab Potential Good    PT Frequency 2x / week    PT Duration 8 weeks    PT Treatment/Interventions ADLs/Self Care Home Management;Cryotherapy;Presenter, broadcasting;Therapeutic exercise;Iontophoresis 81m/ml Dexamethasone;Therapeutic activities;Neuromuscular re-education;Vestibular;Taping;Vasopneumatic Device;Dry needling;Passive range of motion    PT Next Visit Plan Balance, gait, quadriceps strength    PT Home Exercise Plan Access Code: [email or web address]   Consulted and Agree with Plan of Care Patient           Patient will benefit from skilled therapeutic intervention in order to improve the following deficits and impairments:  Decreased activity tolerance,Decreased balance,Decreased endurance,Decreased mobility,Decreased range of motion,Decreased strength,Difficulty walking  Visit Diagnosis: Difficulty walking  Muscle weakness (generalized)  Localized edema  Stiffness  of  right knee, not elsewhere classified  Chronic pain of right knee  Acute pain of right knee  Other abnormalities of gait and mobility     Problem List Patient Active Problem List   Diagnosis Date Noted   Status post revision of total replacement of right knee 05/07/2020   Failed total knee, right, 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/2020   History of total right knee replacement 01/28/2020   Vitamin D deficiency 08/29/2018   Insulin resistance 08/29/2018   Class 3 severe obesity with serious comorbidity and body mass index (BMI) of 40.0 to 44.9 in adult [facility]) 08/14/2018   Other specified glaucoma 08/14/2018   Incontinence of urine in female 11/07/2017   Vertigo 11/07/2017   Left knee pain 10/25/2016   Genetic testing 08/08/2016   Hypothyroidism 12/02/2015   Anxiety and depression 12/02/2015   OAB (overactive bladder) 12/02/2015   Breast cancer of upper-outer quadrant of left female breast ([place]) 12/02/2015   Obesity (BMI 30-39.9) 12/02/2015    Silvestre Mesi 06/23/2020, 12:23 PM  [facility] [address] [place], [place], [address] Phone: [phone]   Fax:  [phone]  Name: Vanessa Christensen MRN: [id number] Date of Birth: 1951-04-04

## 2020-06-25 ENCOUNTER — Other Ambulatory Visit: Payer: Self-pay | Admitting: Family Medicine

## 2020-06-28 ENCOUNTER — Ambulatory Visit (INDEPENDENT_AMBULATORY_CARE_PROVIDER_SITE_OTHER): Payer: Medicare Other | Admitting: Physical Therapy

## 2020-06-28 ENCOUNTER — Other Ambulatory Visit: Payer: Self-pay

## 2020-06-28 DIAGNOSIS — M25561 Pain in right knee: Secondary | ICD-10-CM

## 2020-06-28 DIAGNOSIS — R600 Localized edema: Secondary | ICD-10-CM

## 2020-06-28 DIAGNOSIS — M6281 Muscle weakness (generalized): Secondary | ICD-10-CM | POA: Diagnosis not present

## 2020-06-28 DIAGNOSIS — R2689 Other abnormalities of gait and mobility: Secondary | ICD-10-CM | POA: Diagnosis not present

## 2020-06-28 DIAGNOSIS — G8929 Other chronic pain: Secondary | ICD-10-CM

## 2020-06-28 DIAGNOSIS — M25661 Stiffness of right knee, not elsewhere classified: Secondary | ICD-10-CM | POA: Diagnosis not present

## 2020-06-28 DIAGNOSIS — R262 Difficulty in walking, not elsewhere classified: Secondary | ICD-10-CM

## 2020-06-28 NOTE — Therapy (Signed)
[facility] Physical Therapy [address] [place], [place], [address] Phone: [phone]   Fax:  [phone]  Physical Therapy Treatment  Patient Details  Name: Vanessa Christensen MRN: [id number] Date of Birth: 27-Oct-1950 Referring Provider (PT): Mcarthur Rossetti, MD   Encounter Date: 06/28/2020   PT End of Session - 06/28/20 1233    Visit Number 8    Number of Visits 12    Date for PT Re-Evaluation 07/19/20    Progress Note Due on Visit 15    PT Start Time 1146    PT Stop Time 1230    PT Time Calculation (min) 44 min    Activity Tolerance Patient tolerated treatment well;No increased pain    Behavior During Therapy WFL for tasks assessed/performed           Past Medical History:  Diagnosis Date  . Anemia    hx of  . Anxiety    pt denies  . Arthritis    hands and feet  . Cancer ([place]) 10/04/2006   breast  . Cataracts, both eyes   . Depression   . Depression   . Diverticulosis of colon   . Glaucoma   . Heart murmur    No work up done 5 years ago  . Joint pain   . Knee pain   . Obesity   . OSA on CPAP   . Osteoarthritis   . Sleep apnea    no CPAP- no longer needed d/t weight loss  . Thyroid activity decreased     Past Surgical History:  Procedure Laterality Date  . BREAST SURGERY Bilateral   . CATARACT EXTRACTION    . COLONOSCOPY    . GLAUCOMA REPAIR    . MASTECTOMY, RADICAL Bilateral   . neulasta induced sterile abscesses    . THYROIDECTOMY, PARTIAL    . TONSILLECTOMY    . TOTAL KNEE ARTHROPLASTY Right   . TOTAL KNEE REVISION Right 05/07/2020   Procedure: RIGHT TOTAL KNEE REVISION ARTHROPLASTY;  Surgeon: Mcarthur Rossetti, MD;  Location: [facility];  Service: Orthopedics;  Laterality: Right;    There were no vitals filed for this visit.   Subjective Assessment - 06/28/20 1155    Subjective She states she is having more pain in her lateral tibia/knee today since she was in last. She feels she may have overdid it on the leg  extension machine. She says she will try her exercise class tonight for the first time so she was cautioned to modify and take things easy with her class being that her knee is already flared up some. She also reports she did trip over her dog this week but no pain resulting from this.    Pertinent History s/p right knee replacement revsision on 05/07/20, TKA was 7 years ago    Limitations Standing;Walking;House hold activities    How long can you stand comfortably? hour    How long can you walk comfortably? one hour    Patient Stated Goals get back to the gym, sleep through the night    Currently in Pain? Yes    Pain Score 4    no pain at rest, 4 with standing   Pain Location Knee    Pain Orientation Right    Pain Descriptors / Indicators Aching    Pain Onset More than a month ago  [facility] Adult PT Treatment/Exercise - 06/28/20 0001      Knee/Hip Exercises: Stretches   Active Hamstring Stretch Right;30 seconds;3 reps    Other Knee/Hip Stretches supine IT band stretch 2x20sec      Knee/Hip Exercises: Aerobic   Recumbent Bike Seat 4 Level 4 10 minutes      Knee/Hip Exercises: Machines for Strengthening   Cybex Knee Flexion 20lbs 3x10    Cybex Leg Press 75# 3x10 slow eccentrics      Knee/Hip Exercises: Standing   Lateral Step Up Both;10 reps;Hand Hold: 2;Step Height: 4"    Forward Step Up Limitations step up 4'' L UE with green t band puling laterally to increase vastus medialis bias      Knee/Hip Exercises: Seated   Long Arc Quad 2 sets;10 reps;Both   3 lb ankle weight                      PT Long Term Goals - 06/14/20 1238      PT LONG TERM GOAL #1   Title Pt will improve FOTO score to predicted value    Baseline next visit    Time 8    Period Weeks    Status On-going      PT LONG TERM GOAL #2   Title Pt will improve Rt knee ROM to 0-120 deg    Baseline 2-120    Time 8    Period Weeks    Status Partially Met       PT LONG TERM GOAL #3   Title Pt will improve 5TSTS test to 13 seconds and SLS to 5 sec to show improved balance    Baseline next visit    Time 8    Period Weeks    Status On-going      PT LONG TERM GOAL #4   Title Pt will be able to ambulate community distances without AD    Time 8    Period Weeks    Status Achieved      PT LONG TERM GOAL #5   Title Pt will improve Rt knee strength to 5/5 and feel ready to return to gym.    Baseline 4+    Time 8    Period Weeks    Status On-going                 Plan - 06/28/20 1233    Clinical Impression Statement Patient is doing well despite flare up of lateral knee pain. Upon assessment of patellar mobility, determined R side had more mobility than the L side especially medial-laterally. Patient is eager to increase her strength and believes she will benefit from continued therapy to build up her strength. She is attempting to go back to her exercise class tonight- check in with this at next visit for ny modifications.  Backed off knee extension and regressed to LAQ for decreased lateral knee pain and patient tolerated this well. She can also benefit from continued PT to address her balance deficits.    Examination-Activity Limitations Bend;Stairs;Sleep;Squat;Lift;Stand    Examination-Participation Restrictions Cleaning;Community Activity;Driving    Stability/Clinical Decision Making Stable/Uncomplicated    Rehab Potential Good    PT Frequency 2x / week    PT Duration 8 weeks    PT Treatment/Interventions ADLs/Self Care Home Management;Cryotherapy;Presenter, broadcasting;Therapeutic exercise;Iontophoresis 55m/ml Dexamethasone;Therapeutic activities;Neuromuscular re-education;Vestibular;Taping;Vasopneumatic Device;Dry needling;Passive range of motion    PT Next Visit Plan work on balance, progress quad strength- check in with lateral  knee pain and how workout class went    PT Home Exercise Plan Access  Code: [email or web address]    Consulted and Agree with Plan of Care Patient           Patient will benefit from skilled therapeutic intervention in order to improve the following deficits and impairments:  Decreased activity tolerance,Decreased balance,Decreased endurance,Decreased mobility,Decreased range of motion,Decreased strength,Difficulty walking  Visit Diagnosis: Difficulty walking  Muscle weakness (generalized)  Localized edema  Stiffness of right knee, not elsewhere classified  Chronic pain of right knee  Acute pain of right knee  Other abnormalities of gait and mobility     Problem List Patient Active Problem List   Diagnosis Date Noted  . Status post revision of total replacement of right knee 05/07/2020  . Failed total knee, right, 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/2020  . History of total right knee replacement 01/28/2020  . Vitamin D deficiency 08/29/2018  . 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 Class 3 severe obesity with serious comorbidity and body mass index (BMI) of 40.0 to 44.9 in adult ([place]) 08/14/2018  . 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 Incontinence of urine in female 11/07/2017  . Vertigo 11/07/2017  . Left knee pain 10/25/2016  . Genetic testing 08/08/2016  . Hypothyroidism 12/02/2015  . Anxiety and depression 12/02/2015  . OAB (overactive bladder) 12/02/2015  . Breast cancer of upper-outer quadrant of left female breast ([place]) 12/02/2015  . Obesity (BMI 30-39.9) 12/02/2015    Glenetta Hew, SPT 06/28/2020, 12:39 PM   During this treatment session, this physical therapist was present, participating in and directing the treatment.   This note has been reviewed and this clinician agrees with the information provided.  Elsie Ra, PT, DPT 06/28/20 2:46 PM   [facility] Physical Therapy [address] [place], [place], [address] Phone: [phone]   Fax:  [phone]  Name: Vanessa Christensen MRN: [id number] Date of Birth:  1951/02/20

## 2020-06-29 ENCOUNTER — Ambulatory Visit (INDEPENDENT_AMBULATORY_CARE_PROVIDER_SITE_OTHER): Payer: Medicare Other | Admitting: Bariatrics

## 2020-06-30 ENCOUNTER — Other Ambulatory Visit: Payer: Self-pay

## 2020-06-30 ENCOUNTER — Ambulatory Visit (INDEPENDENT_AMBULATORY_CARE_PROVIDER_SITE_OTHER): Payer: Medicare Other | Admitting: Physical Therapy

## 2020-06-30 DIAGNOSIS — R600 Localized edema: Secondary | ICD-10-CM

## 2020-06-30 DIAGNOSIS — M25661 Stiffness of right knee, not elsewhere classified: Secondary | ICD-10-CM | POA: Diagnosis not present

## 2020-06-30 DIAGNOSIS — G8929 Other chronic pain: Secondary | ICD-10-CM

## 2020-06-30 DIAGNOSIS — M25561 Pain in right knee: Secondary | ICD-10-CM

## 2020-06-30 DIAGNOSIS — R262 Difficulty in walking, not elsewhere classified: Secondary | ICD-10-CM | POA: Diagnosis not present

## 2020-06-30 DIAGNOSIS — M6281 Muscle weakness (generalized): Secondary | ICD-10-CM | POA: Diagnosis not present

## 2020-06-30 NOTE — Therapy (Signed)
[facility] Therapy [address] [place], [place], [address] Phone: [phone]   Fax:  [phone]  Physical Therapy Treatment  Patient Details  Name: Vanessa Christensen MRN: [id number] Date of Birth: 1951/06/03 Referring Provider (PT): Mcarthur Rossetti, MD   Encounter Date: 06/30/2020   PT End of Session - 06/30/20 1233    Visit Number 9    Number of Visits 12    Date for PT Re-Evaluation 07/19/20    Progress Note Due on Visit 15    PT Start Time 8916    PT Stop Time 1230    PT Time Calculation (min) 46 min    Activity Tolerance Patient tolerated treatment well;No increased pain    Behavior During Therapy WFL for tasks assessed/performed           Past Medical History:  Diagnosis Date  . Anemia    hx of  . Anxiety    pt denies  . Arthritis    hands and feet  . Cancer ([place]) 10/04/2006   breast  . Cataracts, both eyes   . Depression   . Depression   . Diverticulosis of colon   . Glaucoma   . Heart murmur    No work up done 5 years ago  . Joint pain   . Knee pain   . Obesity   . OSA on CPAP   . Osteoarthritis   . Sleep apnea    no CPAP- no longer needed d/t weight loss  . Thyroid activity decreased     Past Surgical History:  Procedure Laterality Date  . BREAST SURGERY Bilateral   . CATARACT EXTRACTION    . COLONOSCOPY    . GLAUCOMA REPAIR    . MASTECTOMY, RADICAL Bilateral   . neulasta induced sterile abscesses    . THYROIDECTOMY, PARTIAL    . TONSILLECTOMY    . TOTAL KNEE ARTHROPLASTY Right   . TOTAL KNEE REVISION Right 05/07/2020   Procedure: RIGHT TOTAL KNEE REVISION ARTHROPLASTY;  Surgeon: Mcarthur Rossetti, MD;  Location: [facility];  Service: Orthopedics;  Laterality: Right;    There were no vitals filed for this visit.   Subjective Assessment - 06/30/20 1147    Subjective She returned to her workout class and did activity modifications for kneeling activities and squatting with TRX. Patient felt no pain or  irritation while doing her workout but is feeling sore after 2 workout classes two days in a row. Denies any pain but just sore. Advised patient to ease back into her workout class routine by giving herself a day in between for rest and in a few weeks she can ease into doing 3x per week to give her body time to adapt to increased activity.    Pertinent History s/p right knee replacement revsision on 05/07/20, TKA was 7 years ago    Limitations Standing;Walking;House hold activities    How long can you stand comfortably? hour    How long can you walk comfortably? one hour    Patient Stated Goals get back to the gym, sleep through the night    Currently in Pain? No/denies    Pain Onset More than a month ago                             [facility] Adult PT Treatment/Exercise - 06/30/20 0001      Knee/Hip Exercises: Stretches   Active Hamstring Stretch Right;30 seconds;3 reps;Both;Left   seated  Knee/Hip Exercises: Aerobic   Nustep L4 x10 mins      Knee/Hip Exercises: Machines for Strengthening   Cybex Knee Flexion 20lbs 3x10    Cybex Leg Press 75# 3x10 slow eccentrics      Knee/Hip Exercises: Standing   Heel Raises 20 reps   on airex   Lateral Step Up Both;10 reps;Hand Hold: 2;Step Height: 4"    Forward Step Up Limitations step up 4'' L UE with green t band puling laterally to increase vastus medialis bias    SLS with constant UE one sided, 10 sec bilat x3    Other Standing Knee Exercises mini squats with TRX 2x10      Knee/Hip Exercises: Seated   Long Arc Quad 2 sets;10 reps;Both    Sit to Sand 10 reps;without UE support                       PT Long Term Goals - 06/14/20 1238      PT LONG TERM GOAL #1   Title Pt will improve FOTO score to predicted value    Baseline next visit    Time 8    Period Weeks    Status On-going      PT LONG TERM GOAL #2   Title Pt will improve Rt knee ROM to 0-120 deg    Baseline 2-120    Time 8    Period Weeks     Status Partially Met      PT LONG TERM GOAL #3   Title Pt will improve 5TSTS test to 13 seconds and SLS to 5 sec to show improved balance    Baseline next visit    Time 8    Period Weeks    Status On-going      PT LONG TERM GOAL #4   Title Pt will be able to ambulate community distances without AD    Time 8    Period Weeks    Status Achieved      PT LONG TERM GOAL #5   Title Pt will improve Rt knee strength to 5/5 and feel ready to return to gym.    Baseline 4+    Time 8    Period Weeks    Status On-going                 Plan - 06/30/20 1222    Clinical Impression Statement Patient is doing well with her stregthening program. We added in TRX squats and sit to stands to keep working towards her goal with STS as well as focusing more on balance to work towards SLS without UE. She continues to show inability to balance on Rt leg wihtout support for more than a second. She can benefit from added balance challenges on foam. She feels much better after session today and is eager to continue her strengthening program. Next visit focal point on adding more TRX if tolerated along with lateral work for added leg strength.    Examination-Activity Limitations Bend;Stairs;Sleep;Squat;Lift;Stand    Examination-Participation Restrictions Cleaning;Community Activity;Driving    Stability/Clinical Decision Making Stable/Uncomplicated    Rehab Potential Good    PT Frequency 2x / week    PT Duration 8 weeks    PT Treatment/Interventions ADLs/Self Care Home Management;Cryotherapy;Presenter, broadcasting;Therapeutic exercise;Iontophoresis 1m/ml Dexamethasone;Therapeutic activities;Neuromuscular re-education;Vestibular;Taping;Vasopneumatic Device;Dry needling;Passive range of motion    PT Next Visit Plan balance progression on foam, TRX lateral slides, side stepping with red band    PT  Home Exercise Plan Access Code: [email or web address]    Consulted and Agree with Plan  of Care Patient           Patient will benefit from skilled therapeutic intervention in order to improve the following deficits and impairments:  Decreased activity tolerance,Decreased balance,Decreased endurance,Decreased mobility,Decreased range of motion,Decreased strength,Difficulty walking  Visit Diagnosis: Difficulty walking  Muscle weakness (generalized)  Localized edema  Stiffness of right knee, not elsewhere classified  Chronic pain of right knee  Acute pain of right knee     Problem List Patient Active Problem List   Diagnosis Date Noted  . Status post revision of total replacement of right knee 05/07/2020  . Failed total knee, right, 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/2020  . History of total right knee replacement 01/28/2020  . Vitamin D deficiency 08/29/2018  . 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 Class 3 severe obesity with serious comorbidity and body mass index (BMI) of 40.0 to 44.9 in adult ([place]) 08/14/2018  . 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 Incontinence of urine in female 11/07/2017  . Vertigo 11/07/2017  . Left knee pain 10/25/2016  . Genetic testing 08/08/2016  . Hypothyroidism 12/02/2015  . Anxiety and depression 12/02/2015  . OAB (overactive bladder) 12/02/2015  . Breast cancer of upper-outer quadrant of left female breast ([place]) 12/02/2015  . Obesity (BMI 30-39.9) 12/02/2015    Glenetta Hew, SPT 06/30/2020, 12:35 PM   During this treatment session, this physical therapist was present, participating in and directing the treatment.   This note has been reviewed and this clinician agrees with the information provided.   Elsie Ra, PT, DPT 06/30/20 4:07 PM   [facility] Physical Therapy [address] [place], [place], [address] Phone: [phone]   Fax:  [phone]  Name: Vanessa Christensen MRN: [id number] Date of Birth: 1950-06-22

## 2020-07-05 ENCOUNTER — Other Ambulatory Visit: Payer: Self-pay

## 2020-07-05 ENCOUNTER — Encounter: Payer: Self-pay | Admitting: Family Medicine

## 2020-07-05 ENCOUNTER — Other Ambulatory Visit: Payer: Self-pay | Admitting: Family Medicine

## 2020-07-05 ENCOUNTER — Ambulatory Visit (INDEPENDENT_AMBULATORY_CARE_PROVIDER_SITE_OTHER): Payer: Medicare Other

## 2020-07-05 ENCOUNTER — Ambulatory Visit (INDEPENDENT_AMBULATORY_CARE_PROVIDER_SITE_OTHER): Payer: Medicare Other | Admitting: Family Medicine

## 2020-07-05 VITALS — BP 128/75 | HR 71 | Temp 97.9°F | Resp 20 | Ht 62.0 in | Wt 197.4 lb

## 2020-07-05 VITALS — Ht 62.0 in | Wt 195.0 lb

## 2020-07-05 DIAGNOSIS — F419 Anxiety disorder, unspecified: Secondary | ICD-10-CM | POA: Diagnosis not present

## 2020-07-05 DIAGNOSIS — Z0000 Encounter for general adult medical examination without abnormal findings: Secondary | ICD-10-CM

## 2020-07-05 DIAGNOSIS — F32A Depression, unspecified: Secondary | ICD-10-CM

## 2020-07-05 NOTE — Progress Notes (Signed)
   Subjective:    Patient ID: Vanessa Christensen, female    DOB: 29-Mar-1951, 70 y.o.   MRN: [id number]  HPI Anxiety and depression- pt's Prozac was increased to 40mg  daily at last visit and Trazodone was added for sleep.  'it's amazing how sleep improves things.  It's great'.  'i'm just so happy.  I feel like I'm me again'.  Denies palpitations, increased irritability, HAs.  Friends and husband have commented on improvement.  Anxiety is better as well.  Feels better able to handle things.  No longer getting overwhelmed w/ tasks.   Review of Systems For ROS see HPI   This visit occurred during the SARS-CoV-2 public health emergency.  Safety protocols were in place, including screening questions prior to the visit, additional usage of staff PPE, and extensive cleaning of exam room while observing appropriate contact time as indicated for disinfecting solutions.       Objective:   Physical Exam Vitals reviewed.  Constitutional:      General: She is not in acute distress.    Appearance: Normal appearance.  HENT:     Head: Normocephalic and atraumatic.  Skin:    General: Skin is warm and dry.  Neurological:     General: No focal deficit present.     Mental Status: She is alert and oriented to person, place, and time.  Psychiatric:        Mood and Affect: Mood normal.        Behavior: Behavior normal.        Thought Content: Thought content normal.           Assessment & Plan:

## 2020-07-05 NOTE — Patient Instructions (Signed)
Ms. Bales , Thank you for taking time to complete your Medicare Wellness Visit. I appreciate your ongoing commitment to your health goals. Please review the following plan we discussed and let me know if I can assist you in the future.   Screening recommendations/referrals: Colonoscopy: Completed 09/23/2019-Due-09/23/2022 Mammogram: Bilateral Mastectomy Bone Density: Completed 02/07/2019-Due 02/06/2021 Recommended yearly ophthalmology/optometry visit for glaucoma screening and checkup Recommended yearly dental visit for hygiene and checkup  Vaccinations: Influenza vaccine: Up to date Pneumococcal vaccine: Discuss with pharmacy Tdap vaccine: Up to date-Due-12/02/2023 Shingles vaccine: Completed vaccines  Covid-19:Completed vaccines  Advanced directives: Copy in chart  Conditions/risks identified: See problem list  Next appointment: Follow up in one year for your annual wellness visit    Preventive Care 72 Years and Older, Female Preventive care refers to lifestyle choices and visits with your health care provider that can promote health and wellness. What does preventive care include?  A yearly physical exam. This is also called an annual well check.  Dental exams once or twice a year.  Routine eye exams. Ask your health care provider how often you should have your eyes checked.  Personal lifestyle choices, including:  Daily care of your teeth and gums.  Regular physical activity.  Eating a healthy diet.  Avoiding tobacco and drug use.  Limiting alcohol use.  Practicing safe sex.  Taking low-dose aspirin every day.  Taking vitamin and mineral supplements as recommended by your health care provider. What happens during an annual well check? The services and screenings done by your health care provider during your annual well check will depend on your age, overall health, lifestyle risk factors, and family history of disease. Counseling  Your health care provider may ask  you questions about your:  Alcohol use.  Tobacco use.  Drug use.  Emotional well-being.  Home and relationship well-being.  Sexual activity.  Eating habits.  History of falls.  Memory and ability to understand (cognition).  Work and work Statistician.  Reproductive health. Screening  You may have the following tests or measurements:  Height, weight, and BMI.  Blood pressure.  Lipid and cholesterol levels. These may be checked every 5 years, or more frequently if you are over 54 years old.  Skin check.  Lung cancer screening. You may have this screening every year starting at age 72 if you have a 30-pack-year history of smoking and currently smoke or have quit within the past 15 years.  Fecal occult blood test (FOBT) of the stool. You may have this test every year starting at age 72.  Flexible sigmoidoscopy or colonoscopy. You may have a sigmoidoscopy every 5 years or a colonoscopy every 10 years starting at age 55.  Hepatitis C blood test.  Hepatitis B blood test.  Sexually transmitted disease (STD) testing.  Diabetes screening. This is done by checking your blood sugar (glucose) after you have not eaten for a while (fasting). You may have this done every 1-3 years.  Bone density scan. This is done to screen for osteoporosis. You may have this done starting at age 72.  Mammogram. This may be done every 1-2 years. Talk to your health care provider about how often you should have regular mammograms. Talk with your health care provider about your test results, treatment options, and if necessary, the need for more tests. Vaccines  Your health care provider may recommend certain vaccines, such as:  Influenza vaccine. This is recommended every year.  Tetanus, diphtheria, and acellular pertussis (Tdap, Td) vaccine. You  may need a Td booster every 10 years.  Zoster vaccine. You may need this after age 67.  Pneumococcal 13-valent conjugate (PCV13) vaccine. One dose  is recommended after age 107.  Pneumococcal polysaccharide (PPSV23) vaccine. One dose is recommended after age 68. Talk to your health care provider about which screenings and vaccines you need and how often you need them. This information is not intended to replace advice given to you by your health care provider. Make sure you discuss any questions you have with your health care provider. Document Released: 06/18/2015 Document Revised: 02/09/2016 Document Reviewed: 03/23/2015 Elsevier Interactive Patient Education  2017 [place] Prevention in the Home Falls can cause injuries. They can happen to people of all ages. There are many things you can do to make your home safe and to help prevent falls. What can I do on the outside of my home?  Regularly fix the edges of walkways and driveways and fix any cracks.  Remove anything that might make you trip as you walk through a door, such as a raised step or threshold.  Trim any bushes or trees on the path to your home.  Use bright outdoor lighting.  Clear any walking paths of anything that might make someone trip, such as rocks or tools.  Regularly check to see if handrails are loose or broken. Make sure that both sides of any steps have handrails.  Any raised decks and porches should have guardrails on the edges.  Have any leaves, snow, or ice cleared regularly.  Use sand or salt on walking paths during winter.  Clean up any spills in your garage right away. This includes oil or grease spills. What can I do in the bathroom?  Use night lights.  Install grab bars by the toilet and in the tub and shower. Do not use towel bars as grab bars.  Use non-skid mats or decals in the tub or shower.  If you need to sit down in the shower, use a plastic, non-slip stool.  Keep the floor dry. Clean up any water that spills on the floor as soon as it happens.  Remove soap buildup in the tub or shower regularly.  Attach bath mats  securely with double-sided non-slip rug tape.  Do not have throw rugs and other things on the floor that can make you trip. What can I do in the bedroom?  Use night lights.  Make sure that you have a light by your bed that is easy to reach.  Do not use any sheets or blankets that are too big for your bed. They should not hang down onto the floor.  Have a firm chair that has side arms. You can use this for support while you get dressed.  Do not have throw rugs and other things on the floor that can make you trip. What can I do in the kitchen?  Clean up any spills right away.  Avoid walking on wet floors.  Keep items that you use a lot in easy-to-reach places.  If you need to reach something above you, use a strong step stool that has a grab bar.  Keep electrical cords out of the way.  Do not use floor polish or wax that makes floors slippery. If you must use wax, use non-skid floor wax.  Do not have throw rugs and other things on the floor that can make you trip. What can I do with my stairs?  Do not leave any items  on the stairs.  Make sure that there are handrails on both sides of the stairs and use them. Fix handrails that are broken or loose. Make sure that handrails are as long as the stairways.  Check any carpeting to make sure that it is firmly attached to the stairs. Fix any carpet that is loose or worn.  Avoid having throw rugs at the top or bottom of the stairs. If you do have throw rugs, attach them to the floor with carpet tape.  Make sure that you have a light switch at the top of the stairs and the bottom of the stairs. If you do not have them, ask someone to add them for you. What else can I do to help prevent falls?  Wear shoes that:  Do not have high heels.  Have rubber bottoms.  Are comfortable and fit you well.  Are closed at the toe. Do not wear sandals.  If you use a stepladder:  Make sure that it is fully opened. Do not climb a closed  stepladder.  Make sure that both sides of the stepladder are locked into place.  Ask someone to hold it for you, if possible.  Clearly mark and make sure that you can see:  Any grab bars or handrails.  First and last steps.  Where the edge of each step is.  Use tools that help you move around (mobility aids) if they are needed. These include:  Canes.  Walkers.  Scooters.  Crutches.  Turn on the lights when you go into a dark area. Replace any light bulbs as soon as they burn out.  Set up your furniture so you have a clear path. Avoid moving your furniture around.  If any of your floors are uneven, fix them.  If there are any pets around you, be aware of where they are.  Review your medicines with your doctor. Some medicines can make you feel dizzy. This can increase your chance of falling. Ask your doctor what other things that you can do to help prevent falls. This information is not intended to replace advice given to you by your health care provider. Make sure you discuss any questions you have with your health care provider. Document Released: 03/18/2009 Document Revised: 10/28/2015 Document Reviewed: 06/26/2014 Elsevier Interactive Patient Education  2017 [employer].

## 2020-07-05 NOTE — Patient Instructions (Signed)
Follow up in 6 months to recheck thyroid No need for labs today!  No med changes! Keep up the good work! Call with any questions or concerns Stay Safe!  Stay Healthy!!

## 2020-07-05 NOTE — Progress Notes (Signed)
Subjective:   Vanessa Christensen is a 70 y.o. female who presents for Medicare Annual (Subsequent) preventive examination.  I connected with Raphaela today by telephone and verified that I am speaking with the correct person using two identifiers. Location patient: home Location provider: work Persons participating in the virtual visit: patient, Marine scientist.    I discussed the limitations, risks, security and privacy concerns of performing an evaluation and management service by telephone and the availability of in person appointments. I also discussed with the patient that there may be a patient responsible charge related to this service. The patient expressed understanding and verbally consented to this telephonic visit.    Interactive audio and video telecommunications were attempted between this provider and patient, however failed, due to patient having technical difficulties OR patient did not have access to video capability.  We continued and completed visit with audio only.  Some vital signs may be absent or patient reported.   Time Spent with patient on telephone encounter: 25 minutes   Review of Systems     Cardiac Risk Factors include: advanced age (>63men, >43 women);obesity (BMI >30kg/m2)     Objective:    Today's Vitals   07/05/20 1116  Weight: 195 lb (88.5 kg)  Height: 5\' 2"  (1.575 m)   Body mass index is 35.67 kg/m.  Advanced Directives 07/05/2020 05/07/2020 05/05/2020 07/02/2019 05/09/2017 07/13/2016 07/06/2016  Does Patient Have a Medical Advance Directive? Yes Yes Yes Yes Yes Yes Yes  Type of Paramedic of [place];Living will [place];Living will Living will;Healthcare Power of [place];Living will [place];Living will Living will  Does patient want to make changes to medical advance directive? - No - Patient declined - No - Patient declined - - -  Copy of  [place] in Chart? Yes - validated most recent copy scanned in chart (See row information) No - copy requested No - copy requested Yes - validated most recent copy scanned in chart (See row information) Yes Yes -    Current Medications (verified) Outpatient Encounter Medications as of 07/05/2020  Medication Sig  . acetaminophen (TYLENOL) 500 MG tablet Take 1,000 mg by mouth every 6 (six) hours as needed (pain).  . Apoaequorin (PREVAGEN PO) Take 1 tablet by mouth daily.   . Ascorbic Acid (VITAMIN C PO) Take 1 tablet by mouth daily.  Marland Kitchen aspirin EC 81 MG tablet Take 81 mg by mouth daily. Swallow whole.  . AZOPT 1 % ophthalmic suspension Place 1 drop into both eyes in the morning and at bedtime.   . B Complex-C (SUPER B COMPLEX PO) Take 1 tablet by mouth daily after supper.  Marland Kitchen FLUoxetine (PROZAC) 40 MG capsule Take 1 capsule (40 mg total) by mouth daily.  Marland Kitchen levothyroxine (SYNTHROID) 75 MCG tablet Take 1 tablet (75 mcg total) by mouth daily.  . meclizine (ANTIVERT) 25 MG tablet Take 25 mg by mouth 3 (three) times daily as needed for dizziness.  . Omega-3 Fatty Acids (FISH OIL ADULT GUMMIES) 113.5 MG CHEW Chew 2 tablets by mouth daily at 2 PM.  . psyllium (METAMUCIL) 58.6 % powder Take 1 packet by mouth daily.   . traZODone (DESYREL) 50 MG tablet TAKE 0.5-1 TABLETS BY MOUTH AT BEDTIME AS NEEDED FOR SLEEP.  Marland Kitchen TURMERIC PO Take 1,500 mg by mouth daily after supper.  . Vitamin D, Ergocalciferol, (DRISDOL) 1.25 MG (50000 UNIT) CAPS capsule Take 1 capsule (50,000  Units total) by mouth every 7 (seven) days.   No facility-administered encounter medications on file as of 07/05/2020.    Allergies (verified) Neulasta [pegfilgrastim]   History: Past Medical History:  Diagnosis Date  . Anemia    hx of  . Anxiety    pt denies  . Arthritis    hands and feet  . Cancer ([place]) 10/04/2006   breast  . Cataracts, both eyes   . Depression   . Depression   . Diverticulosis of colon   .  Glaucoma   . Heart murmur    No work up done 5 years ago  . Joint pain   . Knee pain   . Obesity   . OSA on CPAP   . Osteoarthritis   . Sleep apnea    no CPAP- no longer needed d/t weight loss  . Thyroid activity decreased    Past Surgical History:  Procedure Laterality Date  . BREAST SURGERY Bilateral   . CATARACT EXTRACTION    . COLONOSCOPY    . GLAUCOMA REPAIR    . MASTECTOMY, RADICAL Bilateral   . neulasta induced sterile abscesses    . THYROIDECTOMY, PARTIAL    . TONSILLECTOMY    . TOTAL KNEE ARTHROPLASTY Right   . TOTAL KNEE REVISION Right 05/07/2020   Procedure: RIGHT TOTAL KNEE REVISION ARTHROPLASTY;  Surgeon: Mcarthur Rossetti, MD;  Location: [facility];  Service: Orthopedics;  Laterality: Right;   Family History  Problem Relation Age of Onset  . CVA Mother   . Cancer Mother 30       breast cancer   . Heart disease Mother   . Stroke Mother   . Obesity Mother   . Leukemia Father   . High blood pressure Father   . High Cholesterol Father   . Heart disease Father   . Cancer Maternal Aunt 55       breast cancer   . Cancer Maternal Aunt 55       breast cancer  . Colon cancer Maternal Aunt   . Cancer Cousin 32       breast cancer  . Esophageal cancer Neg Hx   . Rectal cancer Neg Hx   . Stomach cancer Neg Hx    Social History   Socioeconomic History  . Marital status: Married    Spouse name: Dail Meece  . Number of children: 2  . Years of education: Not on file  . Highest education level: Not on file  Occupational History  . Occupation: Retired    Comment: Pharmacist, hospital  Tobacco Use  . Smoking status: Former Smoker    Quit date: 11/03/1978    Years since quitting: 41.6  . Smokeless tobacco: Never Used  Vaping Use  . Vaping Use: Never used  Substance and Sexual Activity  . Alcohol use: No  . Drug use: No  . Sexual activity: Not Currently    Birth control/protection: Post-menopausal  Other Topics Concern  . Not on file  Social History Narrative    Enjoys taking care of grandchildren    Social Determinants of Health   Financial Resource Strain: Low Risk   . Difficulty of Paying Living Expenses: Not hard at all  Food Insecurity: No Food Insecurity  . Worried About Charity fundraiser in the Last Year: Never true  . Ran Out of Food in the Last Year: Never true  Transportation Needs: No Transportation Needs  . Lack of Transportation (Medical): No  . Lack of  Transportation (Non-Medical): No  Physical Activity: Insufficiently Active  . Days of Exercise per Week: 2 days  . Minutes of Exercise per Session: 40 min  Stress: No Stress Concern Present  . Feeling of Stress : Not at all  Social Connections: Socially Integrated  . Frequency of Communication with Friends and Family: More than three times a week  . Frequency of Social Gatherings with Friends and Family: More than three times a week  . Attends Religious Services: More than 4 times per year  . Active Member of Clubs or Organizations: Yes  . Attends Archivist Meetings: More than 4 times per year  . Marital Status: Married    Tobacco Counseling Counseling given: Not Answered   Clinical Intake:  Pre-visit preparation completed: Yes  Pain : No/denies pain     Nutritional Status: BMI > 30  Obese Nutritional Risks: None Diabetes: No  How often do you need to have someone help you when you read instructions, pamphlets, or other written materials from your doctor or pharmacy?: 1 - Never  Diabetic?No  Interpreter Needed?: No  Information entered by :: Caroleen Hamman LPN   Activities of Daily Living In your present state of health, do you have any difficulty performing the following activities: 07/05/2020 05/07/2020  Hearing? N N  Vision? N N  Difficulty concentrating or making decisions? N N  Walking or climbing stairs? N Y  Comment - -  Dressing or bathing? N Y  Doing errands, shopping? N N  Preparing Food and eating ? N -  Using the Toilet? N -   In the past six months, have you accidently leaked urine? Y -  Comment occasionally -wears a pad -  Do you have problems with loss of bowel control? Y -  Comment only once -  Managing your Medications? N -  Managing your Finances? N -  Housekeeping or managing your Housekeeping? N -  Some recent data might be hidden    Patient Care Team: [name], MD as PCP - General (Family Medicine) Katy Apo, MD as Consulting Physician (Ophthalmology) Truitt Merle, MD as Consulting Physician (Hematology) [place] (Dentistry) Alla Feeling, NP as Nurse Practitioner (Oncology) Gregor Hams, MD as Consulting Physician (Family Medicine) Ardis Hughs, MD as Consulting Physician (Urology) Georgia Lopes, DO as Consulting Physician ([place]) Madelin Rear, [facility] as Pharmacist (Pharmacist)  Indicate any recent Medical Services you may have received from other than [facility] providers in the past year (date may be approximate).     Assessment:   This is a routine wellness examination for [place].  Hearing/Vision screen  Hearing Screening   125Hz  250Hz  500Hz  1000Hz  2000Hz  3000Hz  4000Hz  6000Hz  8000Hz   Right ear:           Left ear:           Comments: No issues  Vision Screening Comments: Reading glasses Last eye exam-04/2020-Dr. Lyles  Dietary issues and exercise activities discussed: Current Exercise Habits: Home exercise routine, Type of exercise: strength training/weights;stretching, Time (Minutes): 45, Frequency (Times/Week): 2, Weekly Exercise (Minutes/Week): 90, Intensity: Mild, Exercise limited by: None identified  Goals    . Anxiety/Depression: Minimize recurring symptoms     CARE PLAN ENTRY Current Barriers:  . Chronic Disease Management support, education, and care coordination needs related to anxiety/depression. o Fluoxetine 40 mg once daily o Clonazepam 0.5 mg once daily   Pharmacist Clinical Goal(s):  Marland Kitchen Over next 90 days: minimize recurring anxiety/depression  symptoms.  Interventions: .  Comprehensive medication review performed.  Patient Self Care Activities:  . Patient verbalizes understanding of plan to continue medication as prescribed over next 90 days. . Continue current medication and follow up as directed for counseling and/or medication changes.  . Please contact office with any worsening symptoms. Initial goal documentation    . Overactive Bladder: Minimize recurring symptoms     CARE PLAN ENTRY Current Barriers:  . Chronic Disease Management support, education, and care coordination needs related to overactive bladder o Currently taking tolterodine ER 4 mg daily.  Pharmacist Clinical Goal(s):  Marland Kitchen Over next 90 days: Minimize recurring symptoms of overactive bladder  Interventions: . Comprehensive medication review performed. . 1 week telephone f/u with pharmacist to further discuss over active bladder and review potential financial concerns  Patient Self Care Activities:  Marland Kitchen Maintain log for overactive bladder . Patient verbalizes understanding of plan to take medication as prescribed over next 90 days unless directed otherwise. Please call with any questions!  Initial goal documentation.    . Patient Stated     Increase activity     . TSH 0.35-4.5     CARE PLAN ENTRY Current Barriers:  . Chronic Disease Management support, education, and care coordination needs related to  hypothyroidism. o Currently taking levothyroxine 75 mcg daily.  Pharmacist Clinical Goal(s):  Marland Kitchen Over next 90 days: Maintain thyroid stimulating hormone (TSH) 0.35-4.5 .  Interventions: . Comprehensive medication review performed.  Patient Self Care Activities:  . Patient verbalizes understanding of plan to continue medication as prescribed over next 90 days. Please call with any questions! Initial goal documentation    . Weight (lb) < 210 lb (95.3 kg)     Lose weight by monitoring portion control.       Depression Screen PHQ 2/9 Scores  07/05/2020 06/03/2020 12/25/2019 11/06/2019 10/02/2019 07/02/2019 06/04/2019  PHQ - 2 Score 0 6 1 0 1 0 0  PHQ- 9 Score - 19 - 6 4 - 0  Exception Documentation - - - - - - -    Fall Risk Fall Risk  07/05/2020 11/06/2019 10/02/2019 06/04/2019 04/25/2019  Falls in the past year? 1 0 0 0 1  Comment - - - - Emmi Telephone Survey: data to providers prior to load  Number falls in past yr: 0 0 0 0 1  Comment - - - - Emmi Telephone Survey Actual Response = 1  Injury with Fall? 0 0 0 0 0  Follow up Falls prevention discussed Falls evaluation completed Falls evaluation completed Falls evaluation completed -    FALL RISK PREVENTION PERTAINING TO THE HOME:  Any stairs in or around the home? Yes  If so, are there any without handrails? No  Home free of loose throw rugs in walkways, pet beds, electrical cords, etc? No  Adequate lighting in your home to reduce risk of falls? Yes   ASSISTIVE DEVICES UTILIZED TO PREVENT FALLS:  Life alert? No  Use of a cane, walker or w/c? No  Grab bars in the bathroom? No  Shower chair or bench in shower? No  Elevated toilet seat or a handicapped toilet? No   TIMED UP AND GO:  Was the test performed? No . Phone visit  Cognitive Function:Normal cognitive status assessed by  this Nurse Health Advisor. No abnormalities found.   MMSE - Mini Mental State Exam 05/09/2017  Orientation to time 5  Orientation to Place 5  Registration 3  Attention/ Calculation 5  Recall 3  Language- name 2 objects  2  Language- repeat 1  Language- follow 3 step command 3  Language- read & follow direction 1  Write a sentence 1  Copy design 1  Total score 30     6CIT Screen 07/02/2019  What Year? 0 points  What month? 0 points  What time? 0 points  Count back from 20 0 points  Months in reverse 0 points  Repeat phrase 0 points  Total Score 0    Immunizations Immunization History  Administered Date(s) Administered  . Influenza, High Dose Seasonal PF 02/02/2019, 04/09/2020  .  Influenza,inj,Quad PF,6+ Mos 04/16/2017, 02/01/2018  . Influenza-Unspecified 03/05/2016  . PFIZER(Purple Top)SARS-COV-2 Vaccination 07/19/2019, 08/11/2019, 03/08/2020  . Pneumococcal-Unspecified 03/05/2016  . Tdap 12/01/2013  . Zoster 06/05/2008  . Zoster Recombinat (Shingrix) 09/07/2017, 01/25/2018    TDAP status: Up to date  Flu Vaccine status: Up to date  Pneumococcal vaccine status: Up to date  Covid-19 vaccine status: Completed vaccines  Qualifies for Shingles Vaccine? No   Zostavax completed Yes   Shingrix Completed?: Yes  Screening Tests Health Maintenance  Topic Date Due  . PNA vac Low Risk Adult (2 of 2 - PCV13) 10/01/2020 (Originally 03/05/2017)  . COVID-19 Vaccine (4 - Booster for Pfizer series) 09/06/2020  . COLONOSCOPY (Pts 45-75yrs Insurance coverage will need to be confirmed)  09/23/2022  . TETANUS/TDAP  12/02/2023  . INFLUENZA VACCINE  Completed  . DEXA SCAN  Completed  . Hepatitis C Screening  Completed    Health Maintenance  There are no preventive care reminders to display for this patient.  Colorectal cancer screening: Type of screening: Colonoscopy. Completed 09/23/2019. Repeat every 3 years  Mammogram status: No longer required due to Bilateral Mastectomy.  Bone Density status: Completed 02/07/2019. Results reflect: Bone density results: NORMAL. Repeat every 2 years.  Lung Cancer Screening: (Low Dose CT Chest recommended if Age 73-80 years, 30 pack-year currently smoking OR have quit w/in 15years.) does not qualify.     Additional Screening:  Hepatitis C Screening: Completed 10/25/2016  Vision Screening: Recommended annual ophthalmology exams for early detection of glaucoma and other disorders of the eye. Is the patient up to date with their annual eye exam?  Yes  Who is the provider or what is the name of the office in which the patient attends annual eye exams? Dr. Prudencio Burly   Dental Screening: Recommended annual dental exams for proper oral  hygiene  Community Resource Referral / Chronic Care Management: CRR required this visit?  No   CCM required this visit?  No      Plan:     I have personally reviewed and noted the following in the patient's chart:   . Medical and social history . Use of alcohol, tobacco or illicit drugs  . Current medications and supplements . Functional ability and status . Nutritional status . Physical activity . Advanced directives . List of other physicians . Hospitalizations, surgeries, and ER visits in previous 12 months . Vitals . Screenings to include cognitive, depression, and falls . Referrals and appointments  In addition, I have reviewed and discussed with patient certain preventive protocols, quality metrics, and best practice recommendations. A written personalized care plan for preventive services as well as general preventive health recommendations were provided to patient.   Due to this being a telephonic visit, the after visit summary with patients personalized plan was offered to patient via mail or my-chart. Patient would like to access on my-chart.   Marta Antu, LPN   [id number]  Nurse Health Advisor  Nurse Notes: None

## 2020-07-05 NOTE — Assessment & Plan Note (Signed)
Much improved since increasing Prozac and starting Trazodone.  Pt is thrilled with the result.  She is sleeping well, no longer feels overwhelmed, is much happier.  No med changes at this time

## 2020-07-06 ENCOUNTER — Encounter (INDEPENDENT_AMBULATORY_CARE_PROVIDER_SITE_OTHER): Payer: Self-pay | Admitting: Bariatrics

## 2020-07-06 ENCOUNTER — Ambulatory Visit (INDEPENDENT_AMBULATORY_CARE_PROVIDER_SITE_OTHER): Payer: Medicare Other | Admitting: Bariatrics

## 2020-07-06 VITALS — BP 144/68 | HR 81 | Temp 98.4°F | Ht 62.0 in | Wt 192.0 lb

## 2020-07-06 DIAGNOSIS — E038 Other specified hypothyroidism: Secondary | ICD-10-CM

## 2020-07-06 DIAGNOSIS — E8881 Metabolic syndrome: Secondary | ICD-10-CM | POA: Diagnosis not present

## 2020-07-06 DIAGNOSIS — Z6835 Body mass index (BMI) 35.0-35.9, adult: Secondary | ICD-10-CM | POA: Diagnosis not present

## 2020-07-06 NOTE — Progress Notes (Signed)
Chief Complaint:   OBESITY Reginald is here to discuss her progress with her obesity treatment plan along with follow-up of her obesity related diagnoses. Malka is on the Category 3 Plan and states she is following her eating plan approximately 75% of the time. Sandrine states she is doing physical therapy for 45 minutes 2 times per week and cardio and strength training for 60 minutes 2 times per week.  Today's visit was #: 16 Starting weight: 241 lbs Starting date: 08/13/2018 Today's weight: 192 lbs Today's date: 07/06/2020 Total lbs lost to date: 49 lbs Total lbs lost since last in-office visit: 3 lbs  Interim History: Analeya is down an additional 3 pounds and has doing well overall.  She is doing well and going to the gym and is not snacking at night.  Subjective:   1. Insulin resistance Nadia has a diagnosis of insulin resistance based on her elevated fasting insulin level >5. She continues to work on diet and exercise to decrease her risk of diabetes.  She is on no medications.  Denies hypoglycemia.  Lab Results  Component Value Date   INSULIN 8.0 01/15/2020   INSULIN 10.9 11/04/2018   INSULIN 13.2 08/13/2018   Lab Results  Component Value Date   HGBA1C 5.6 01/15/2020   2. Other specified hypothyroidism She is taking Synthroid 75 mcg daily.  Lab Results  Component Value Date   TSH 1.670 01/15/2020   Assessment/Plan:   1. Insulin resistance Shamar will continue to work on weight loss, exercise, and decreasing simple carbohydrates to help decrease the risk of diabetes. Ronika agreed to follow-up with [name] as directed to closely monitor her progress.  Decrease carbohydrates, increase healthy fats and protein.  2. Other specified hypothyroidism Patient with long-standing hypothyroidism, on levothyroxine therapy. She appears euthyroid. Orders and follow up as documented in patient record.  Continue Synthroid.   Counseling . Good thyroid control is important for overall  health. Supratherapeutic thyroid levels are dangerous and will not improve weight loss results. . The correct way to take levothyroxine is fasting, with water, separated by at least 30 minutes from breakfast, and separated by more than 4 hours from calcium, iron, multivitamins, acid reflux medications (PPIs).   3. Class 2 severe obesity with serious comorbidity and body mass index (BMI) of 35.0 to 35.9 in adult, unspecified obesity type [facility])  Clancy is currently in the action stage of change. As such, her goal is to continue with weight loss efforts. She has agreed to the Category 3 Plan.   She will work on meal planning, intentional eating, decreasing portion sizes, and continuing to read labels.  Recipes II sheet provided today.  Exercise goals: Back in the gym and doing PT until 07/19/2020.  Behavioral modification strategies: increasing lean protein intake, decreasing simple carbohydrates, increasing vegetables, increasing water intake, decreasing eating out, no skipping meals, meal planning and cooking strategies, keeping healthy foods in the home and planning for success.  Misk has agreed to follow-up with our clinic in 2-3 weeks. She was informed of the importance of frequent follow-up visits to maximize her success with intensive lifestyle modifications for her multiple health conditions.   Objective:   Blood pressure (!) 144/68, pulse 81, temperature 98.4 F (36.9 C), height 5\' 2"  (1.575 m), weight 192 lb (87.1 kg), SpO2 97 %. Body mass index is 35.12 kg/m.  General: Cooperative, alert, well developed, in no acute distress. HEENT: Conjunctivae and lids unremarkable. Cardiovascular: Regular rhythm.  Lungs: Normal work  of breathing. Neurologic: No focal deficits.   Lab Results  Component Value Date   CREATININE 0.51 05/08/2020   BUN 12 05/08/2020   NA 134 (L) 05/08/2020   K 3.5 05/08/2020   CL 101 05/08/2020   CO2 21 (L) 05/08/2020   Lab Results  Component Value Date    ALT 19 01/15/2020   AST 18 01/15/2020   ALKPHOS 71 01/15/2020   BILITOT 0.3 01/15/2020   Lab Results  Component Value Date   HGBA1C 5.6 01/15/2020   HGBA1C 5.4 11/04/2018   HGBA1C 5.6 08/13/2018   Lab Results  Component Value Date   INSULIN 8.0 01/15/2020   INSULIN 10.9 11/04/2018   INSULIN 13.2 08/13/2018   Lab Results  Component Value Date   TSH 1.670 01/15/2020   Lab Results  Component Value Date   CHOL 188 01/15/2020   HDL 52 01/15/2020   LDLCALC 124 (H) 01/15/2020   TRIG 63 01/15/2020   CHOLHDL 4 05/08/2018   Lab Results  Component Value Date   WBC 11.4 (H) 05/08/2020   HGB 11.3 (L) 05/08/2020   HCT 35.8 (L) 05/08/2020   MCV 93.5 05/08/2020   PLT 161 05/08/2020   Obesity Behavioral Intervention:   Approximately 15 minutes were spent on the discussion below.  ASK: We discussed the diagnosis of obesity with [name] today and Frida agreed to give [name] permission to discuss obesity behavioral modification therapy today.  ASSESS: Faryal has the diagnosis of obesity and her BMI today is 35.2. Lakeithia is in the action stage of change.   ADVISE: Shakeya was educated on the multiple health risks of obesity as well as the benefit of weight loss to improve her health. She was advised of the need for long term treatment and the importance of lifestyle modifications to improve her current health and to decrease her risk of future health problems.  AGREE: Multiple dietary modification options and treatment options were discussed and Chantel agreed to follow the recommendations documented in the above note.  ARRANGE: Jadi was educated on the importance of frequent visits to treat obesity as outlined per CMS and USPSTF guidelines and agreed to schedule her next follow up appointment today.  Attestation Statements:   Reviewed by clinician on day of visit: allergies, medications, problem list, medical history, surgical history, family history, social history, and previous  encounter notes.  I, Water quality scientist, CMA, am acting as Location manager for [employer], DO  I have reviewed the above documentation for accuracy and completeness, and I agree with the above. Jearld Lesch, DO

## 2020-07-07 ENCOUNTER — Encounter: Payer: Self-pay | Admitting: Rehabilitative and Restorative Service Providers"

## 2020-07-07 ENCOUNTER — Ambulatory Visit (INDEPENDENT_AMBULATORY_CARE_PROVIDER_SITE_OTHER): Payer: Medicare Other | Admitting: Rehabilitative and Restorative Service Providers"

## 2020-07-07 ENCOUNTER — Encounter (INDEPENDENT_AMBULATORY_CARE_PROVIDER_SITE_OTHER): Payer: Self-pay | Admitting: Bariatrics

## 2020-07-07 ENCOUNTER — Other Ambulatory Visit: Payer: Self-pay

## 2020-07-07 DIAGNOSIS — G8929 Other chronic pain: Secondary | ICD-10-CM

## 2020-07-07 DIAGNOSIS — M25661 Stiffness of right knee, not elsewhere classified: Secondary | ICD-10-CM

## 2020-07-07 DIAGNOSIS — M25561 Pain in right knee: Secondary | ICD-10-CM

## 2020-07-07 DIAGNOSIS — R262 Difficulty in walking, not elsewhere classified: Secondary | ICD-10-CM

## 2020-07-07 DIAGNOSIS — R600 Localized edema: Secondary | ICD-10-CM | POA: Diagnosis not present

## 2020-07-07 DIAGNOSIS — M6281 Muscle weakness (generalized): Secondary | ICD-10-CM | POA: Diagnosis not present

## 2020-07-07 NOTE — Therapy (Signed)
[facility] [address] [place], [place], [address] Phone: [phone]   Fax:  [phone]  Physical Therapy Treatment  Patient Details  Name: Vanessa Christensen MRN: [id number] Date of Birth: 06-09-1950 Referring Provider (PT): Mcarthur Rossetti, MD   Encounter Date: 07/07/2020   PT End of Session - 07/07/20 1712    Visit Number 10    Number of Visits 12    Date for PT Re-Evaluation 07/19/20    Progress Note Due on Visit 15    PT Start Time 1275    PT Stop Time 1430    PT Time Calculation (min) 45 min    Activity Tolerance Patient tolerated treatment well;No increased pain    Behavior During Therapy WFL for tasks assessed/performed           Past Medical History:  Diagnosis Date  . Anemia    hx of  . Anxiety    pt denies  . Arthritis    hands and feet  . Cancer ([place]) 10/04/2006   breast  . Cataracts, both eyes   . Depression   . Depression   . Diverticulosis of colon   . Glaucoma   . Heart murmur    No work up done 5 years ago  . Joint pain   . Knee pain   . Obesity   . OSA on CPAP   . Osteoarthritis   . Sleep apnea    no CPAP- no longer needed d/t weight loss  . Thyroid activity decreased     Past Surgical History:  Procedure Laterality Date  . BREAST SURGERY Bilateral   . CATARACT EXTRACTION    . COLONOSCOPY    . GLAUCOMA REPAIR    . MASTECTOMY, RADICAL Bilateral   . neulasta induced sterile abscesses    . THYROIDECTOMY, PARTIAL    . TONSILLECTOMY    . TOTAL KNEE ARTHROPLASTY Right   . TOTAL KNEE REVISION Right 05/07/2020   Procedure: RIGHT TOTAL KNEE REVISION ARTHROPLASTY;  Surgeon: Mcarthur Rossetti, MD;  Location: [facility];  Service: Orthopedics;  Laterality: Right;    There were no vitals filed for this visit.   Subjective Assessment - 07/07/20 1419    Subjective Janiah reports increased knee edema with an increase in activity.    Pertinent History s/p right knee replacement revsision on 05/07/20,  TKA was 7 years ago    Limitations Standing;Walking;House hold activities    How long can you stand comfortably? hour    How long can you walk comfortably? one hour    Patient Stated Goals get back to the gym, sleep through the night    Currently in Pain? Yes    Pain Score 3     Pain Location Knee    Pain Orientation Right    Pain Descriptors / Indicators Aching;Sore    Pain Type Chronic pain    Pain Onset More than a month ago    Pain Frequency Intermittent    Aggravating Factors  WB    Pain Relieving Factors Rest    Effect of Pain on Daily Activities Increased edema with and increase in WB function (stand, walk, exercise class)    Multiple Pain Sites No                             [facility] Adult PT Treatment/Exercise - 07/07/20 0001      Therapeutic Activites    Therapeutic Activities ADL's  ADL's Step-up and over with 6 inch step slow eccentrics      Exercises   Exercises Knee/Hip      Knee/Hip Exercises: Aerobic   Recumbent Bike Seat 4 Level 4 10 minutes      Knee/Hip Exercises: Machines for Strengthening   Cybex Knee Extension 10# double leg up and slow eccentrics R only 10X    Cybex Leg Press 100# & 125# slow eccentrics DL 10X each & 50# single leg (R) 10X slow eccentrics      Knee/Hip Exercises: Seated   Long Arc Quad Strengthening;Both;3 sets;5 sets   Seated straight leg raises                 PT Education - 07/07/20 1711    Education Details Reviewed HEP with emphasis on strengthening (seated SLR and sit to stand).    Person(s) Educated Patient    Methods Explanation;Demonstration;Verbal cues;Handout    Comprehension Need further instruction;Returned demonstration;Verbalized understanding;Verbal cues required               PT Long Term Goals - 07/07/20 1711      PT LONG TERM GOAL #1   Title Pt will improve FOTO score to predicted value    Baseline next visit    Time 8    Period Weeks    Status On-going      PT LONG TERM  GOAL #2   Title Pt will improve Rt knee ROM to 0-120 deg    Baseline 2-120    Time 8    Period Weeks    Status Partially Met      PT LONG TERM GOAL #3   Title Pt will improve 5TSTS test to 13 seconds and SLS to 5 sec to show improved balance    Baseline next visit    Time 8    Period Weeks    Status On-going      PT LONG TERM GOAL #4   Title Pt will be able to ambulate community distances without AD    Time 8    Period Weeks    Status Achieved      PT LONG TERM GOAL #5   Title Pt will improve Rt knee strength to 5/5 and feel ready to return to gym.    Baseline 4+    Time 8    Period Weeks    Status On-going                 Plan - 07/07/20 1712    Clinical Impression Statement Vanessa Christensen has excellent AROM.  She notes increased edema with recent increases in her activity level.  This is normal post-TKA and indicates continued quadriceps strengthening is needed.    Examination-Activity Limitations Bend;Stairs;Sleep;Squat;Lift;Stand    Examination-Participation Restrictions Cleaning;Community Activity;Driving    Stability/Clinical Decision Making Stable/Uncomplicated    Rehab Potential Good    PT Frequency 2x / week    PT Duration 8 weeks    PT Treatment/Interventions ADLs/Self Care Home Management;Cryotherapy;Presenter, broadcasting;Therapeutic exercise;Iontophoresis 49m/ml Dexamethasone;Therapeutic activities;Neuromuscular re-education;Vestibular;Taping;Vasopneumatic Device;Dry needling;Passive range of motion    PT Next Visit Plan Quadriceps strength and endurance work    PT Home Exercise Plan Access Code: [email or web address]   Consulted and Agree with Plan of Care Patient           Patient will benefit from skilled therapeutic intervention in order to improve the following deficits and impairments:  Decreased activity tolerance,Decreased balance,Decreased endurance,Decreased mobility,Decreased range of motion,Decreased strength,Difficulty  walking  Visit Diagnosis: Difficulty walking  Muscle weakness (generalized)  Localized edema  Stiffness of right knee, not elsewhere classified  Chronic pain of right knee     Problem List Patient Active Problem List   Diagnosis Date Noted  . Status post revision of total replacement of right knee 05/07/2020  . Failed total knee, right, 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/2020  . History of total right knee replacement 01/28/2020  . Vitamin D deficiency 08/29/2018  . 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 Class 3 severe obesity with serious comorbidity and body mass index (BMI) of 40.0 to 44.9 in adult ([place]) 08/14/2018  . 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 Incontinence of urine in female 11/07/2017  . Vertigo 11/07/2017  . Left knee pain 10/25/2016  . Genetic testing 08/08/2016  . Hypothyroidism 12/02/2015  . Anxiety and depression 12/02/2015  . OAB (overactive bladder) 12/02/2015  . Breast cancer of upper-outer quadrant of left female breast ([place]) 12/02/2015  . Obesity (BMI 30-39.9) 12/02/2015    Farley Ly PT, MPT 07/07/2020, 5:14 PM  [facility] Physical Therapy [address] [place], [place], [address] Phone: [phone]   Fax:  [phone]  Name: Vanessa Christensen MRN: [id number] Date of Birth: 1950-09-20

## 2020-07-08 ENCOUNTER — Telehealth: Payer: Self-pay

## 2020-07-08 NOTE — Chronic Care Management (AMB) (Signed)
    Chronic Care Management Pharmacy Assistant   Name: Adyn Hoes  MRN: [id number] DOB: Sep 20, 1950  Patient scheduled her follow-up telephone call with Madelin Rear, CPP for Friday 08/27/2020 at 9:00 am.   April D Calhoun, [place] Pharmacist Assistant [phone]   Follow-Up:  Scheduled Follow-Up With Clinical Pharmacist

## 2020-07-09 ENCOUNTER — Ambulatory Visit (INDEPENDENT_AMBULATORY_CARE_PROVIDER_SITE_OTHER): Payer: Medicare Other | Admitting: Physical Therapy

## 2020-07-09 ENCOUNTER — Other Ambulatory Visit: Payer: Self-pay

## 2020-07-09 DIAGNOSIS — M6281 Muscle weakness (generalized): Secondary | ICD-10-CM | POA: Diagnosis not present

## 2020-07-09 DIAGNOSIS — G8929 Other chronic pain: Secondary | ICD-10-CM | POA: Diagnosis not present

## 2020-07-09 DIAGNOSIS — M25661 Stiffness of right knee, not elsewhere classified: Secondary | ICD-10-CM | POA: Diagnosis not present

## 2020-07-09 DIAGNOSIS — R600 Localized edema: Secondary | ICD-10-CM | POA: Diagnosis not present

## 2020-07-09 DIAGNOSIS — M25561 Pain in right knee: Secondary | ICD-10-CM

## 2020-07-09 DIAGNOSIS — R262 Difficulty in walking, not elsewhere classified: Secondary | ICD-10-CM | POA: Diagnosis not present

## 2020-07-09 NOTE — Therapy (Signed)
[facility] Physical Therapy [address] [place], [place], [address] Phone: [phone]   Fax:  [phone]  Physical Therapy Treatment  Patient Details  Name: Vanessa Christensen MRN: [id number] Date of Birth: [birth year] Referring Provider (PT): Mcarthur Rossetti, MD   Encounter Date: 07/09/2020   PT End of Session - 07/09/20 1312    Visit Number 11    Number of Visits 12    Date for PT Re-Evaluation 07/19/20    Progress Note Due on Visit 15    PT Start Time 1300    PT Stop Time 1345    PT Time Calculation (min) 45 min    Activity Tolerance Patient tolerated treatment well;No increased pain    Behavior During Therapy WFL for tasks assessed/performed           Past Medical History:  Diagnosis Date  . Anemia    hx of  . Anxiety    pt denies  . Arthritis    hands and feet  . Cancer ([place]) 10/04/2006   breast  . Cataracts, both eyes   . Depression   . Depression   . Diverticulosis of colon   . Glaucoma   . Heart murmur    No work up done 5 years ago  . Joint pain   . Knee pain   . Obesity   . OSA on CPAP   . Osteoarthritis   . Sleep apnea    no CPAP- no longer needed d/t weight loss  . Thyroid activity decreased     Past Surgical History:  Procedure Laterality Date  . BREAST SURGERY Bilateral   . CATARACT EXTRACTION    . COLONOSCOPY    . GLAUCOMA REPAIR    . MASTECTOMY, RADICAL Bilateral   . neulasta induced sterile abscesses    . THYROIDECTOMY, PARTIAL    . TONSILLECTOMY    . TOTAL KNEE ARTHROPLASTY Right   . TOTAL KNEE REVISION Right 05/07/2020   Procedure: RIGHT TOTAL KNEE REVISION ARTHROPLASTY;  Surgeon: Mcarthur Rossetti, MD;  Location: [facility];  Service: Orthopedics;  Laterality: Right;    There were no vitals filed for this visit.   Subjective Assessment - 07/09/20 1312    Subjective Vanessa Christensen relays more stiffness and swelling in her knee overall today but doing ok with pain    Pertinent History s/p right knee  replacement revsision on 05/07/20, TKA was 7 years ago    Limitations Standing;Walking;House hold activities    How long can you stand comfortably? hour    How long can you walk comfortably? one hour    Patient Stated Goals get back to the gym, sleep through the night    Pain Onset More than a month ago             [facility] Adult PT Treatment/Exercise - 07/09/20 0001      Knee/Hip Exercises: Stretches   Knee: Self-Stretch Limitations Tailgate knee flexion AROM 1 minutes    Gastroc Stretch Both;3 reps;30 seconds    Gastroc Stretch Limitations slantboard      Knee/Hip Exercises: Aerobic   Recumbent Bike Seat 4 Level 2 10 minutes      Knee/Hip Exercises: Machines for Strengthening   Cybex Knee Extension 10# double leg up and slow eccentrics bilat (had pain with Rt leg only today) 3X10    Cybex Leg Press 125# DL 15X each & 50# single leg (R) 15X slow eccentrics      Knee/Hip Exercises: Standing   Forward Step Up  Right;10 reps;Step Height: 6";2 sets;Hand Hold: 2    Other Standing Knee Exercises mini squats with TRX 2x10, holding 2 sec      Vasopneumatic   Number Minutes Vasopneumatic  10 minutes    Vasopnuematic Location  Knee    Vasopneumatic Pressure Medium    Vasopneumatic Temperature  34             PT Long Term Goals - 07/07/20 1711      PT LONG TERM GOAL #1   Title Pt will improve FOTO score to predicted value    Baseline next visit    Time 8    Period Weeks    Status On-going      PT LONG TERM GOAL #2   Title Pt will improve Rt knee ROM to 0-120 deg    Baseline 2-120    Time 8    Period Weeks    Status Partially Met      PT LONG TERM GOAL #3   Title Pt will improve 5TSTS test to 13 seconds and SLS to 5 sec to show improved balance    Baseline next visit    Time 8    Period Weeks    Status On-going      PT LONG TERM GOAL #4   Title Pt will be able to ambulate community distances without AD    Time 8    Period Weeks    Status Achieved      PT LONG TERM  GOAL #5   Title Pt will improve Rt knee strength to 5/5 and feel ready to return to gym.    Baseline 4+    Time 8    Period Weeks    Status On-going                 Plan - 07/09/20 1334    Clinical Impression Statement Continued with Rt quad strenght progression with good overall tolerance to this. She did have more edema noted in her Rt knee so used vaso at end of treatment today to reduce this. Continue to progress as tolerated.    Examination-Activity Limitations Bend;Stairs;Sleep;Squat;Lift;Stand    Examination-Participation Restrictions Cleaning;Community Activity;Driving    Stability/Clinical Decision Making Stable/Uncomplicated    Rehab Potential Good    PT Frequency 2x / week    PT Duration 8 weeks    PT Treatment/Interventions ADLs/Self Care Home Management;Cryotherapy;Presenter, broadcasting;Therapeutic exercise;Iontophoresis 60m/ml Dexamethasone;Therapeutic activities;Neuromuscular re-education;Vestibular;Taping;Vasopneumatic Device;Dry needling;Passive range of motion    PT Next Visit Plan Quadriceps strength and endurance work    PT Home Exercise Plan Access Code: [email or web address]   Consulted and Agree with Plan of Care Patient           Patient will benefit from skilled therapeutic intervention in order to improve the following deficits and impairments:  Decreased activity tolerance,Decreased balance,Decreased endurance,Decreased mobility,Decreased range of motion,Decreased strength,Difficulty walking  Visit Diagnosis: Difficulty walking  Muscle weakness (generalized)  Localized edema  Stiffness of right knee, not elsewhere classified  Chronic pain of right knee  Acute pain of right knee     Problem List Patient Active Problem List   Diagnosis Date Noted  . Status post revision of total replacement of right knee 05/07/2020  . Failed total knee, right, 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/2020  . History of total right knee  replacement 01/28/2020  . Vitamin D deficiency 08/29/2018  . 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 Class 3 severe obesity with serious comorbidity and body mass index (BMI) of  40.0 to 44.9 in adult [facility]) 08/14/2018  . 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 Incontinence of urine in female 11/07/2017  . Vertigo 11/07/2017  . Left knee pain 10/25/2016  . Genetic testing 08/08/2016  . Hypothyroidism 12/02/2015  . Anxiety and depression 12/02/2015  . OAB (overactive bladder) 12/02/2015  . Breast cancer of upper-outer quadrant of left female breast ([place]) 12/02/2015  . Obesity (BMI 30-39.9) 12/02/2015    Debbe Odea, PT,DPT 07/09/2020, 1:36 PM  [facility] Physical Therapy [address] [place], [place], [address] Phone: [phone]   Fax:  [phone]  Name: Vanessa Christensen MRN: [id number] Date of Birth: 10-13-1950

## 2020-07-12 ENCOUNTER — Telehealth (INDEPENDENT_AMBULATORY_CARE_PROVIDER_SITE_OTHER): Payer: Self-pay | Admitting: Bariatrics

## 2020-07-12 NOTE — Telephone Encounter (Signed)
Patient forgot to tell Dr. [name] that she needs a prescription refill on Vit D.

## 2020-07-13 ENCOUNTER — Other Ambulatory Visit (INDEPENDENT_AMBULATORY_CARE_PROVIDER_SITE_OTHER): Payer: Self-pay | Admitting: Bariatrics

## 2020-07-13 ENCOUNTER — Ambulatory Visit (INDEPENDENT_AMBULATORY_CARE_PROVIDER_SITE_OTHER): Payer: Medicare Other | Admitting: Physical Therapy

## 2020-07-13 ENCOUNTER — Other Ambulatory Visit: Payer: Self-pay

## 2020-07-13 DIAGNOSIS — R262 Difficulty in walking, not elsewhere classified: Secondary | ICD-10-CM

## 2020-07-13 DIAGNOSIS — E559 Vitamin D deficiency, unspecified: Secondary | ICD-10-CM

## 2020-07-13 DIAGNOSIS — M25661 Stiffness of right knee, not elsewhere classified: Secondary | ICD-10-CM | POA: Diagnosis not present

## 2020-07-13 DIAGNOSIS — M6281 Muscle weakness (generalized): Secondary | ICD-10-CM | POA: Diagnosis not present

## 2020-07-13 DIAGNOSIS — M25561 Pain in right knee: Secondary | ICD-10-CM

## 2020-07-13 DIAGNOSIS — R600 Localized edema: Secondary | ICD-10-CM

## 2020-07-13 DIAGNOSIS — R2689 Other abnormalities of gait and mobility: Secondary | ICD-10-CM | POA: Diagnosis not present

## 2020-07-13 DIAGNOSIS — G8929 Other chronic pain: Secondary | ICD-10-CM | POA: Diagnosis not present

## 2020-07-13 MED ORDER — VITAMIN D (ERGOCALCIFEROL) 1.25 MG (50000 UNIT) PO CAPS: 50000.0000 [IU] | ORAL_CAPSULE | ORAL | 0 refills | Status: DC

## 2020-07-13 NOTE — Telephone Encounter (Signed)
Call pt. Rx sent

## 2020-07-13 NOTE — Therapy (Signed)
[facility] Physical Therapy [address] [place], [place], [address] Phone: [phone]   Fax:  [phone]  Physical Therapy Treatment  Patient Details  Name: Vanessa Christensen MRN: [id number] Date of Birth: [birth year] Referring Provider (PT): Mcarthur Rossetti, MD   Encounter Date: 07/13/2020   PT End of Session - 07/13/20 1434    Visit Number 12    Number of Visits 12    Date for PT Re-Evaluation 07/19/20    Progress Note Due on Visit 15    PT Start Time 1351    PT Stop Time 1431    PT Time Calculation (min) 40 min    Activity Tolerance Patient tolerated treatment well;No increased pain    Behavior During Therapy WFL for tasks assessed/performed           Past Medical History:  Diagnosis Date  . Anemia    hx of  . Anxiety    pt denies  . Arthritis    hands and feet  . Cancer ([place]) 10/04/2006   breast  . Cataracts, both eyes   . Depression   . Depression   . Diverticulosis of colon   . Glaucoma   . Heart murmur    No work up done 5 years ago  . Joint pain   . Knee pain   . Obesity   . OSA on CPAP   . Osteoarthritis   . Sleep apnea    no CPAP- no longer needed d/t weight loss  . Thyroid activity decreased     Past Surgical History:  Procedure Laterality Date  . BREAST SURGERY Bilateral   . CATARACT EXTRACTION    . COLONOSCOPY    . GLAUCOMA REPAIR    . MASTECTOMY, RADICAL Bilateral   . neulasta induced sterile abscesses    . THYROIDECTOMY, PARTIAL    . TONSILLECTOMY    . TOTAL KNEE ARTHROPLASTY Right   . TOTAL KNEE REVISION Right 05/07/2020   Procedure: RIGHT TOTAL KNEE REVISION ARTHROPLASTY;  Surgeon: Mcarthur Rossetti, MD;  Location: [facility];  Service: Orthopedics;  Laterality: Right;    There were no vitals filed for this visit.   Subjective Assessment - 07/13/20 1424    Subjective Patient relays no pain or stiffness currently. She is continuing her exercise classes with no problems to report, she does notice some  swelling after her classes which she tries to reduce with icing.    Pertinent History s/p right knee replacement revsision on 05/07/20, TKA was 7 years ago    Limitations Standing;Walking;House hold activities    How long can you stand comfortably? hour    How long can you walk comfortably? one hour    Patient Stated Goals get back to the gym, sleep through the night    Currently in Pain? No/denies    Pain Onset More than a month ago                             [facility] Adult PT Treatment/Exercise - 07/13/20 0001      Knee/Hip Exercises: Aerobic   Recumbent Bike 10 mins level 4      Knee/Hip Exercises: Machines for Strengthening   Cybex Knee Extension 10# double leg up and slow eccentrics bilat (had pain with Rt leg only today) 2X10    Cybex Leg Press 125# DL 2x10  & 50# single leg (R) 15X slow eccentrics      Knee/Hip Exercises: Standing  Lateral Step Up Right;2 sets;10 reps;Hand Hold: 2;Step Height: 6"   with green tband TKE 2 second hold   Forward Step Up Right;2 sets;10 reps;Hand Hold: 1;Step Height: 6"    Step Down Right;1 set;15 reps;Step Height: 4";Hand Hold: 1   with retro step up   SLS with constant UE 20 sec bilat x4    Other Standing Knee Exercises mini squats with TRX 2x10, holding 2 sec                       PT Long Term Goals - 07/07/20 1711      PT LONG TERM GOAL #1   Title Pt will improve FOTO score to predicted value    Baseline next visit    Time 8    Period Weeks    Status On-going      PT LONG TERM GOAL #2   Title Pt will improve Rt knee ROM to 0-120 deg    Baseline 2-120    Time 8    Period Weeks    Status Partially Met      PT LONG TERM GOAL #3   Title Pt will improve 5TSTS test to 13 seconds and SLS to 5 sec to show improved balance    Baseline next visit    Time 8    Period Weeks    Status On-going      PT LONG TERM GOAL #4   Title Pt will be able to ambulate community distances without AD    Time 8    Period  Weeks    Status Achieved      PT LONG TERM GOAL #5   Title Pt will improve Rt knee strength to 5/5 and feel ready to return to gym.    Baseline 4+    Time 8    Period Weeks    Status On-going                 Plan - 07/13/20 1428    Clinical Impression Statement Patient is making good progress with new strength exercises. Recomended patient monitor swelling and any difficulties with her exercise classes and continue icing. Continue to reassess each visit and add strength progressions with stairs as tolerated.    Examination-Activity Limitations Bend;Stairs;Sleep;Squat;Lift;Stand    Examination-Participation Restrictions Cleaning;Community Activity;Driving    Stability/Clinical Decision Making Stable/Uncomplicated    Rehab Potential Good    PT Frequency 2x / week    PT Duration 8 weeks    PT Treatment/Interventions ADLs/Self Care Home Management;Cryotherapy;Presenter, broadcasting;Therapeutic exercise;Iontophoresis 48m/ml Dexamethasone;Therapeutic activities;Neuromuscular re-education;Vestibular;Taping;Vasopneumatic Device;Dry needling;Passive range of motion    PT Next Visit Plan eccentric quadriceps strength and endurance work, TRX slider exercise    PT Home Exercise Plan Access Code: [email or web address]   Consulted and Agree with Plan of Care Patient           Patient will benefit from skilled therapeutic intervention in order to improve the following deficits and impairments:  Decreased activity tolerance,Decreased balance,Decreased endurance,Decreased mobility,Decreased range of motion,Decreased strength,Difficulty walking  Visit Diagnosis: Difficulty walking  Muscle weakness (generalized)  Localized edema  Stiffness of right knee, not elsewhere classified  Chronic pain of right knee  Acute pain of right knee  Other abnormalities of gait and mobility     Problem List Patient Active Problem List   Diagnosis Date Noted  . Status  post revision of total replacement of right knee 05/07/2020  . Failed total knee, right, subsequent  encounter 05/06/2020  . History of total right knee replacement 01/28/2020  . Vitamin D deficiency 08/29/2018  . 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 Class 3 severe obesity with serious comorbidity and body mass index (BMI) of 40.0 to 44.9 in adult ([place]) 08/14/2018  . 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 Incontinence of urine in female 11/07/2017  . Vertigo 11/07/2017  . Left knee pain 10/25/2016  . Genetic testing 08/08/2016  . Hypothyroidism 12/02/2015  . Anxiety and depression 12/02/2015  . OAB (overactive bladder) 12/02/2015  . Breast cancer of upper-outer quadrant of left female breast ([place]) 12/02/2015  . Obesity (BMI 30-39.9) 12/02/2015    Glenetta Hew, SPT 07/13/2020, 2:36 PM   During this treatment session, this physical therapist was present, participating in and directing the treatment.   This note has been reviewed and this clinician agrees with the information provided.  Elsie Ra, PT, DPT 07/13/20 4:11 PM   [facility] [address] [place], [place], [address] Phone: [phone]   Fax:  [phone]  Name: Vanessa Christensen MRN: [id number] Date of Birth: [birth year]

## 2020-07-13 NOTE — Telephone Encounter (Signed)
Refill request

## 2020-07-16 ENCOUNTER — Other Ambulatory Visit: Payer: Self-pay

## 2020-07-16 ENCOUNTER — Ambulatory Visit (INDEPENDENT_AMBULATORY_CARE_PROVIDER_SITE_OTHER): Payer: Medicare Other | Admitting: Physical Therapy

## 2020-07-16 DIAGNOSIS — R262 Difficulty in walking, not elsewhere classified: Secondary | ICD-10-CM | POA: Diagnosis not present

## 2020-07-16 DIAGNOSIS — G8929 Other chronic pain: Secondary | ICD-10-CM

## 2020-07-16 DIAGNOSIS — M25661 Stiffness of right knee, not elsewhere classified: Secondary | ICD-10-CM

## 2020-07-16 DIAGNOSIS — R600 Localized edema: Secondary | ICD-10-CM | POA: Diagnosis not present

## 2020-07-16 DIAGNOSIS — M6281 Muscle weakness (generalized): Secondary | ICD-10-CM

## 2020-07-16 DIAGNOSIS — M25561 Pain in right knee: Secondary | ICD-10-CM | POA: Diagnosis not present

## 2020-07-16 NOTE — Therapy (Signed)
[facility] [address] [place], [place], [address] Phone: [phone]   Fax:  [phone]  Physical Therapy Treatment/Recert/Progress note Progress Note reporting period 05/24/2020 to 07/16/2020  See below for objective and subjective measurements relating to patients progress with PT.   Patient Details  Name: Vanessa Christensen MRN: [id number] Date of Birth: [birth year] Referring Provider (PT): Mcarthur Rossetti, MD   Encounter Date: 07/16/2020   PT End of Session - 07/16/20 1143    Visit Number 13    Number of Visits 21    Date for PT Re-Evaluation 08/13/20    Progress Note Due on Visit 22    PT Start Time 1059    PT Stop Time 1147    PT Time Calculation (min) 48 min    Activity Tolerance Patient tolerated treatment well;No increased pain    Behavior During Therapy WFL for tasks assessed/performed           Past Medical History:  Diagnosis Date  . Anemia    hx of  . Anxiety    pt denies  . Arthritis    hands and feet  . Cancer ([place]) 10/04/2006   breast  . Cataracts, both eyes   . Depression   . Depression   . Diverticulosis of colon   . Glaucoma   . Heart murmur    No work up done 5 years ago  . Joint pain   . Knee pain   . Obesity   . OSA on CPAP   . Osteoarthritis   . Sleep apnea    no CPAP- no longer needed d/t weight loss  . Thyroid activity decreased     Past Surgical History:  Procedure Laterality Date  . BREAST SURGERY Bilateral   . CATARACT EXTRACTION    . COLONOSCOPY    . GLAUCOMA REPAIR    . MASTECTOMY, RADICAL Bilateral   . neulasta induced sterile abscesses    . THYROIDECTOMY, PARTIAL    . TONSILLECTOMY    . TOTAL KNEE ARTHROPLASTY Right   . TOTAL KNEE REVISION Right 05/07/2020   Procedure: RIGHT TOTAL KNEE REVISION ARTHROPLASTY;  Surgeon: Mcarthur Rossetti, MD;  Location: [facility];  Service: Orthopedics;  Laterality: Right;    There were no vitals filed for this visit.   Subjective  Assessment - 07/16/20 1106    Subjective No reports of pain or stiffness. She has been stretching before she gets up for the day which has been helping her stiffness.    Pertinent History s/p right knee replacement revsision on 05/07/20, TKA was 7 years ago    Limitations Standing;Walking;House hold activities    How long can you stand comfortably? hour    How long can you walk comfortably? one hour    Patient Stated Goals get back to the gym, sleep through the night    Pain Onset More than a month ago              [facility] PT Assessment - 07/16/20 0001      Standardized Balance Assessment   Standardized Balance Assessment Five Times Sit to Stand    Five times sit to stand comments  11.25 without UE            [facility] Adult PT Treatment/Exercise - 07/16/20 0001      Knee/Hip Exercises: Aerobic   Recumbent Bike 10 mins level 4      Knee/Hip Exercises: Machines for Strengthening   Cybex Knee Extension 10# double leg up  and slow eccentrics bilat (had pain with Rt leg only today) 2X10    Cybex Leg Press 125# DL 3x10 & single leg (R) 15 slow eccentric lowering      Knee/Hip Exercises: Standing   Terminal Knee Extension Limitations 10 lbs TKE sit to stand 15x, 10lbs 4'' step up with 3 sec hold 15x    Rocker Board 2 minutes   balance   SLS with constant UE 20 sec bilat x4    Other Standing Knee Exercises mini squats with TRX 2x10, holding 2 sec, TRX sliders 10x anterior, 10x lateral                       PT Long Term Goals - 07/16/20 1150      PT LONG TERM GOAL #1   Title Pt will improve FOTO score to predicted value    Baseline next visit    Time 8    Period Weeks    Status On-going      PT LONG TERM GOAL #2   Title Pt will improve Rt knee ROM to 0-120 deg    Baseline 2-120    Time 8    Period Weeks    Status Partially Met      PT LONG TERM GOAL #3   Title Pt will improve 5TSTS test to 13 seconds and SLS to 5 sec to show improved balance    Baseline now  11.35 without UE, work toward SLS goal    Time 8    Period Weeks    Status Partially Met      PT LONG TERM GOAL #4   Title Pt will be able to ambulate community distances without AD    Time 8    Period Weeks    Status Achieved      PT LONG TERM GOAL #5   Title Pt will improve Rt knee strength to 5/5 and feel ready to return to gym.    Baseline 4+    Time 8    Period Weeks    Status On-going                 Plan - 07/16/20 1147    Clinical Impression Statement Updated functional balance and strength test with 5xSTS and patient has partially met one of her LTGs and is making good progress toward meeting the rest of her goals. Patient is gaining confidence with her ability to complete activities but can still benefit from additional skilled PT to address her remaining LE weakness and her balance and coordination.    Examination-Activity Limitations Bend;Stairs;Sleep;Squat;Lift;Stand    Examination-Participation Restrictions Cleaning;Community Activity;Driving    Stability/Clinical Decision Making Stable/Uncomplicated    Rehab Potential Good    PT Frequency 2x / week    PT Duration 8 weeks    PT Treatment/Interventions ADLs/Self Care Home Management;Cryotherapy;Presenter, broadcasting;Therapeutic exercise;Iontophoresis 70m/ml Dexamethasone;Therapeutic activities;Neuromuscular re-education;Vestibular;Taping;Vasopneumatic Device;Dry needling;Passive range of motion    PT Next Visit Plan capture FOTO, progress TRX and TKE progression for quad strength, update advanced HEP    PT Home Exercise Plan Access Code: [email or web address]   Consulted and Agree with Plan of Care Patient           Patient will benefit from skilled therapeutic intervention in order to improve the following deficits and impairments:  Decreased activity tolerance,Decreased balance,Decreased endurance,Decreased mobility,Decreased range of motion,Decreased strength,Difficulty  walking  Visit Diagnosis: Muscle weakness (generalized)  Difficulty walking  Localized edema  Stiffness  of right knee, not elsewhere classified  Chronic pain of right knee     Problem List Patient Active Problem List   Diagnosis Date Noted  . Status post revision of total replacement of right knee 05/07/2020  . Failed total knee, right, 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/2020  . History of total right knee replacement 01/28/2020  . Vitamin D deficiency 08/29/2018  . 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 Class 3 severe obesity with serious comorbidity and body mass index (BMI) of 40.0 to 44.9 in adult ([place]) 08/14/2018  . 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 Incontinence of urine in female 11/07/2017  . Vertigo 11/07/2017  . Left knee pain 10/25/2016  . Genetic testing 08/08/2016  . Hypothyroidism 12/02/2015  . Anxiety and depression 12/02/2015  . OAB (overactive bladder) 12/02/2015  . Breast cancer of upper-outer quadrant of left female breast ([place]) 12/02/2015  . Obesity (BMI 30-39.9) 12/02/2015    Glenetta Hew, SPT 07/16/2020, 11:54 AM   During this treatment session, this physical therapist was present, participating in and directing the treatment.   This note has been reviewed and this clinician agrees with the information provided.  Elsie Ra, PT, DPT 07/16/20 2:42 PM   [facility] [address] [place], [place], [address] Phone: [phone]   Fax:  [phone]  Name: Vanessa Christensen MRN: [id number] Date of Birth: June 11, 1950

## 2020-07-17 ENCOUNTER — Other Ambulatory Visit: Payer: Self-pay | Admitting: Family Medicine

## 2020-07-17 DIAGNOSIS — E038 Other specified hypothyroidism: Secondary | ICD-10-CM

## 2020-07-19 ENCOUNTER — Other Ambulatory Visit: Payer: Self-pay

## 2020-07-19 ENCOUNTER — Ambulatory Visit (INDEPENDENT_AMBULATORY_CARE_PROVIDER_SITE_OTHER): Payer: Medicare Other | Admitting: Physical Therapy

## 2020-07-19 DIAGNOSIS — M25661 Stiffness of right knee, not elsewhere classified: Secondary | ICD-10-CM

## 2020-07-19 DIAGNOSIS — R262 Difficulty in walking, not elsewhere classified: Secondary | ICD-10-CM

## 2020-07-19 DIAGNOSIS — R2689 Other abnormalities of gait and mobility: Secondary | ICD-10-CM

## 2020-07-19 DIAGNOSIS — M6281 Muscle weakness (generalized): Secondary | ICD-10-CM

## 2020-07-19 DIAGNOSIS — R600 Localized edema: Secondary | ICD-10-CM | POA: Diagnosis not present

## 2020-07-19 DIAGNOSIS — G8929 Other chronic pain: Secondary | ICD-10-CM

## 2020-07-19 DIAGNOSIS — M25561 Pain in right knee: Secondary | ICD-10-CM | POA: Diagnosis not present

## 2020-07-19 NOTE — Therapy (Addendum)
[facility] [address] [place], [place], [address] Phone: [phone]   Fax:  [phone]  Physical Therapy Treatment/Discharge  Patient Details  Name: Vanessa Christensen MRN: [id number] Date of Birth: May 26, 1951 Referring Provider (PT): Mcarthur Rossetti, MD   Encounter Date: 07/19/2020   PT End of Session - 07/19/20 1153    Visit Number 14    Number of Visits 21    Date for PT Re-Evaluation 08/13/20    Progress Note Due on Visit 22    PT Start Time 1100    PT Stop Time 1150    PT Time Calculation (min) 50 min    Activity Tolerance Patient tolerated treatment well;No increased pain    Behavior During Therapy WFL for tasks assessed/performed           Past Medical History:  Diagnosis Date  . Anemia    hx of  . Anxiety    pt denies  . Arthritis    hands and feet  . Cancer ([place]) 10/04/2006   breast  . Cataracts, both eyes   . Depression   . Depression   . Diverticulosis of colon   . Glaucoma   . Heart murmur    No work up done 5 years ago  . Joint pain   . Knee pain   . Obesity   . OSA on CPAP   . Osteoarthritis   . Sleep apnea    no CPAP- no longer needed d/t weight loss  . Thyroid activity decreased     Past Surgical History:  Procedure Laterality Date  . BREAST SURGERY Bilateral   . CATARACT EXTRACTION    . COLONOSCOPY    . GLAUCOMA REPAIR    . MASTECTOMY, RADICAL Bilateral   . neulasta induced sterile abscesses    . THYROIDECTOMY, PARTIAL    . TONSILLECTOMY    . TOTAL KNEE ARTHROPLASTY Right   . TOTAL KNEE REVISION Right 05/07/2020   Procedure: RIGHT TOTAL KNEE REVISION ARTHROPLASTY;  Surgeon: Mcarthur Rossetti, MD;  Location: [facility];  Service: Orthopedics;  Laterality: Right;    There were no vitals filed for this visit.   Subjective Assessment - 07/19/20 1107    Subjective Patient reports no issues over the weekend some slight quad soreness. Pain is around 1-2 today from colder weather.     Pertinent History s/p right knee replacement revsision on 05/07/20, TKA was 7 years ago    Limitations Standing;Walking;House hold activities    How long can you stand comfortably? hour    How long can you walk comfortably? one hour    Patient Stated Goals get back to the gym, sleep through the night    Currently in Pain? Yes    Pain Score 2     Pain Location Knee    Pain Orientation Right    Pain Descriptors / Indicators Aching    Pain Type Chronic pain    Pain Onset More than a month ago             [facility] Adult PT Treatment/Exercise - 07/19/20 0001      Knee/Hip Exercises: Aerobic   Recumbent Bike 10 mins L5      Knee/Hip Exercises: Machines for Strengthening   Cybex Knee Extension #10 bilat up, Rleg slow eccentric 2x10    Cybex Leg Press 125# DL 3x10 & single leg (R) #50 15 slow eccentric lowering      Knee/Hip Exercises: Standing   Lateral Step Up Right;2 sets;10  reps;Hand Hold: 2;Step Height: 6"   with green Tband   Rocker Board 5 minutes   balance both planes   SLS with constant UE 20 sec bilat x4    Other Standing Knee Exercises alt cone taps standing on airex 10x, step up and over airex with UE suport 10x    Other Standing Knee Exercises forward and lateral sliders with UE support 10x each direction      Knee/Hip Exercises: Seated   Sit to Sand 10 reps;without UE support                       PT Long Term Goals - 07/19/20 1200      PT LONG TERM GOAL #1   Title Pt will improve FOTO score to predicted value    Baseline next visit    Time 8    Period Weeks    Status On-going      PT LONG TERM GOAL #2   Title Pt will improve Rt knee ROM to 0-120 deg    Baseline 2-120    Time 8    Period Weeks    Status Partially Met      PT LONG TERM GOAL #3   Title Pt will improve 5TSTS test to 13 seconds and SLS to 5 sec to show improved balance    Baseline now 11.35 without UE, work toward SLS goal    Time 8    Period Weeks    Status Partially Met       PT LONG TERM GOAL #4   Title Pt will be able to ambulate community distances without AD    Time 8    Period Weeks    Status Achieved      PT LONG TERM GOAL #5   Title Pt will improve Rt knee strength to 5/5 and feel ready to return to gym.    Baseline 4+    Time 8    Period Weeks    Status Partially Met                 Plan - 07/19/20 1154    Clinical Impression Statement Patient has made good progress in her LE strength and balance. She wishes to put a hold on therapy for now to focus on her HEP and her workout classes. She may return in a month for advanced strengthening to work towards hopefully meeting the rest of her [place]. She demonstrates good ability to continue her HEP independently.    Examination-Activity Limitations Bend;Stairs;Sleep;Squat;Lift;Stand    Examination-Participation Restrictions Cleaning;Community Activity;Driving    Stability/Clinical Decision Making Stable/Uncomplicated    Rehab Potential Good    PT Frequency 2x / week    PT Duration 8 weeks    PT Treatment/Interventions ADLs/Self Care Home Management;Cryotherapy;Presenter, broadcasting;Therapeutic exercise;Iontophoresis 63m/ml Dexamethasone;Therapeutic activities;Neuromuscular re-education;Vestibular;Taping;Vasopneumatic Device;Dry needling;Passive range of motion    PT Next Visit Plan update HEP prn, capture FOTO, advanced strenghtening    PT Home Exercise Plan Access Code: [email or web address]   Consulted and Agree with Plan of Care Patient           Patient will benefit from skilled therapeutic intervention in order to improve the following deficits and impairments:  Decreased activity tolerance,Decreased balance,Decreased endurance,Decreased mobility,Decreased range of motion,Decreased strength,Difficulty walking  Visit Diagnosis: Muscle weakness (generalized)  Difficulty walking  Localized edema  Stiffness of right knee, not elsewhere classified  Chronic pain  of right knee  Acute pain of right  knee  Other abnormalities of gait and mobility     Problem List Patient Active Problem List   Diagnosis Date Noted  . Status post revision of total replacement of right knee 05/07/2020  . Failed total knee, right, 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/2020  . History of total right knee replacement 01/28/2020  . Vitamin D deficiency 08/29/2018  . 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 Class 3 severe obesity with serious comorbidity and body mass index (BMI) of 40.0 to 44.9 in adult ([place]) 08/14/2018  . 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 Incontinence of urine in female 11/07/2017  . Vertigo 11/07/2017  . Left knee pain 10/25/2016  . Genetic testing 08/08/2016  . Hypothyroidism 12/02/2015  . Anxiety and depression 12/02/2015  . OAB (overactive bladder) 12/02/2015  . Breast cancer of upper-outer quadrant of left female breast ([place]) 12/02/2015  . Obesity (BMI 30-39.9) 12/02/2015    Glenetta Hew, SPT 07/19/2020, 12:14 PM   During this treatment session, this physical therapist was present, participating in and directing the treatment.   This note has been reviewed and this clinician agrees with the information provided.  Elsie Ra, PT, DPT 07/19/20 1:17 PM   PHYSICAL THERAPY DISCHARGE SUMMARY  Visits from Start of Care: 14  Current functional level related to goals / functional outcomes: See note   Remaining deficits: See note   Education / Equipment: HEP Plan: Patient agrees to discharge.  Patient goals were partially met. Patient is being discharged due to not returning since the last visit.  ?????    Scot Jun, PT, DPT, OCS, ATC 10/19/20  2:15 PM      [facility] [address] [place], [place], [address] Phone: [phone]   Fax:  [phone]  Name: Vanessa Christensen MRN: [id number] Date of Birth: March 13, 1951

## 2020-07-27 ENCOUNTER — Encounter (INDEPENDENT_AMBULATORY_CARE_PROVIDER_SITE_OTHER): Payer: Self-pay | Admitting: Bariatrics

## 2020-07-27 ENCOUNTER — Other Ambulatory Visit: Payer: Self-pay

## 2020-07-27 ENCOUNTER — Ambulatory Visit (INDEPENDENT_AMBULATORY_CARE_PROVIDER_SITE_OTHER): Payer: Medicare Other | Admitting: Bariatrics

## 2020-07-27 VITALS — BP 141/79 | HR 78 | Temp 97.9°F | Ht 62.0 in | Wt 190.0 lb

## 2020-07-27 DIAGNOSIS — E038 Other specified hypothyroidism: Secondary | ICD-10-CM | POA: Diagnosis not present

## 2020-07-27 DIAGNOSIS — E559 Vitamin D deficiency, unspecified: Secondary | ICD-10-CM

## 2020-07-27 DIAGNOSIS — E6609 Other obesity due to excess calories: Secondary | ICD-10-CM

## 2020-07-27 DIAGNOSIS — Z6834 Body mass index (BMI) 34.0-34.9, adult: Secondary | ICD-10-CM

## 2020-07-27 MED ORDER — VITAMIN D (ERGOCALCIFEROL) 1.25 MG (50000 UNIT) PO CAPS: 50000.0000 [IU] | ORAL_CAPSULE | ORAL | 0 refills | Status: DC

## 2020-07-28 NOTE — Progress Notes (Signed)
[place]   [phone] [phone]   Clinic Follow up Note   Patient Care Team: [name], MD as PCP - General (Family Medicine) Katy Apo, MD as Consulting Physician (Ophthalmology) Truitt Merle, MD as Consulting Physician (Hematology) [place] (Dentistry) Alla Feeling, NP as Nurse Practitioner (Oncology) Gregor Hams, MD as Consulting Physician (Family Medicine) Ardis Hughs, MD as Consulting Physician (Urology) Georgia Lopes, DO as Consulting Physician ([place]) Madelin Rear, [facility] as Pharmacist (Pharmacist)  Date of Service:  07/30/2020  CHIEF COMPLAINT: F/u of left breast cancer  SUMMARY OF ONCOLOGIC HISTORY: Oncology History Overview Note  Breast cancer of upper-outer quadrant of left female breast [facility])   Staging form: Breast, AJCC 7th Edition   - Pathologic stage from 10/31/2006: Stage IIB (T2, N40m, cM0) - Signed by YTruitt Merle MD on 01/11/2016    Breast cancer of upper-outer quadrant of left female breast ([place]  09/25/2006 Initial Biopsy   Left breast 2:00 position mass core needle biopsy showed invasive carcinoma with overlapping features of lobular and ductal carcinoma, grade 2.   09/25/2006 Receptors her2   ER 3+ positive, PR 3+ positive, HER-2 equivocal on IHC, [facility] of [facility]   09/25/2006 Initial Diagnosis   [place]   10/01/2006 Miscellaneous   BRCA1 and BRCA2 gene sequencing was negative for mutation.   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   10/2006 - 10/2016 Anti-estrogen oral therapy   Adjuvant anastrozole 172mdaily     01/10/2007 - 08/08/2007 Chemotherapy   Adriamycin 12080mnd Cytoxan 1200m64mery 4 weeks for 4 cycles, followed by docetaxel 150mg72m 2 cycles.      09/09/2008 Imaging   PET scan showed no evidence of recurrence or metastatic breast cancer.   01/19/2016 Imaging   Bone Density Scan The BMD measured at Femur Neck Right is 0.973 g/cm2 with a T-score of -0.5.   Genetic testing  08/08/2016 Initial Diagnosis   Genetic testing was negative for pathogenic variants in the 80 genes on the Multi-Gene Panel offered by [facility], included sequencing and/or deletion duplication testing of the following genes: ALK, APC, ATM, AXIN2,BAP1,  BARD1, BLM, BMPR1A, BRCA1, BRCA2, BRIP1, CASR, CDC73, CDH1, CDK4, CDKN1B, CDKN1C, CDKN2A (p14ARF), CDKN2A (p16INK4a), CEBPA, CHEK2, DICER1, CIS3L2, EGFR (c.2369C>T, p.Thr790Met variant only), EPCAM (Deletion/duplication testing only), FH, FLCN, GATA2, GPC3, GREM1 (Promoter region deletion/duplication testing only), HOXB13 (c.251G>A, p.Gly84Glu), HRAS, KIT, MAX, MEN1, MET, MITF (c.952G>A, p.Glu318Lys variant only), MLH1, MSH2, MSH6, MUTYH, NBN, NF1, NF2, PALB2, PDGFRA, PHOX2B, PMS2, POLD1, POLE, POT1, PRKAR1A, PTCH1, PTEN, RAD50, RAD51C, RAD51D, RB1, RECQL4, RET, RUNX1, SDHAF2, SDHA (sequence changes only), SDHB, SDHC, SDHD, SMAD4, SMARCA4, SMARCB1, SMARCE1, STK11, SUFU, TERT, TERT, TMEM127, TP53, TSC1, TSC2, VHL, WRN and WT1. Date of test report is 08/04/16.  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 No medical decisions should be made based on this result at this time given its unknown clinical significance.       CURRENT THERAPY:  Surveillance   INTERVAL HISTORY:  Vanessa Dobbinsere for a follow up of left breast cancer. She presents to the clinic alone. She notes  [facility]. She notes so far she has been able to lose 53 pounds from diet and exercise. She did have left knee revision  surgery in 05/2020. She denies any new pain and energy adequate. She notes she has been stressed at times.    REVIEW OF SYSTEMS:   Constitutional: Denies fevers, chills or abnormal weight loss Eyes: Denies blurriness of vision Ears, nose, mouth, throat, and face: Denies mucositis or sore throat Respiratory: Denies cough, dyspnea or wheezes Cardiovascular: Denies palpitation, chest discomfort or lower extremity swelling Gastrointestinal:  Denies nausea, heartburn or change in bowel habits Skin: Denies abnormal skin rashes Lymphatics: Denies new lymphadenopathy or easy bruising Neurological:Denies numbness, tingling or new weaknesses Behavioral/Psych: Mood is stable, no new changes  All other systems were reviewed with the patient and are negative.  MEDICAL HISTORY:  Past Medical History:  Diagnosis Date  . Anemia    hx of  . Anxiety    pt denies  . Arthritis    hands and feet  . Cancer ([place]) 10/04/2006   breast  . Cataracts, both eyes   . Depression   . Depression   . Diverticulosis of colon   . Glaucoma   . Heart murmur    No work up done 5 years ago  . Joint pain   . Knee pain   . Obesity   . OSA on CPAP   . Osteoarthritis   . Sleep apnea    no CPAP- no longer needed d/t weight loss  . Thyroid activity decreased     SURGICAL HISTORY: Past Surgical History:  Procedure Laterality Date  . BREAST SURGERY Bilateral   . CATARACT EXTRACTION    . COLONOSCOPY    . GLAUCOMA REPAIR    . MASTECTOMY, RADICAL Bilateral   . neulasta induced sterile abscesses    . THYROIDECTOMY, PARTIAL    . TONSILLECTOMY    . TOTAL KNEE ARTHROPLASTY Right   . TOTAL KNEE REVISION Right 05/07/2020   Procedure: RIGHT TOTAL KNEE REVISION ARTHROPLASTY;  Surgeon: Mcarthur Rossetti, MD;  Location: [facility];  Service: Orthopedics;  Laterality: Right;    I have reviewed the social history and family history with the patient and they are unchanged from previous note.  ALLERGIES:  is allergic to  neulasta [pegfilgrastim].  MEDICATIONS:  Current Outpatient Medications  Medication Sig Dispense Refill  . acetaminophen (TYLENOL) 500 MG tablet Take 1,000 mg by mouth every 6 (six) hours as needed (pain).    . Apoaequorin (PREVAGEN PO) Take 1 tablet by mouth daily.     . Ascorbic Acid (VITAMIN C PO) Take 1 tablet by mouth daily.    Marland Kitchen aspirin EC 81 MG tablet Take 81 mg by mouth daily. Swallow whole.    . AZOPT 1 % ophthalmic suspension Place 1 drop into both eyes in the morning and at bedtime.   12  . B Complex-C (SUPER B COMPLEX PO) Take 1 tablet by mouth daily after supper.    Marland Kitchen FLUoxetine (PROZAC) 40 MG capsule Take 1 capsule (40 mg total) by mouth daily. 90 capsule 3  . levothyroxine (SYNTHROID) 75 MCG tablet TAKE 1 TABLET BY MOUTH EVERY DAY 90 tablet 1  . meclizine (ANTIVERT) 25 MG tablet Take 25 mg by mouth 3 (three) times daily as needed for dizziness.    . Omega-3 Fatty Acids (FISH OIL ADULT GUMMIES) 113.5 MG CHEW Chew 2 tablets by mouth daily at 2 PM.    .  psyllium (METAMUCIL) 58.6 % powder Take 1 packet by mouth daily.     . traZODone (DESYREL) 50 MG tablet TAKE 0.5-1 TABLETS BY MOUTH AT BEDTIME AS NEEDED FOR SLEEP. 90 tablet 2  . Vitamin D, Ergocalciferol, (DRISDOL) 1.25 MG (50000 UNIT) CAPS capsule Take 1 capsule (50,000 Units total) by mouth every 14 (fourteen) days. 2 capsule 0   No current facility-administered medications for this visit.    PHYSICAL EXAMINATION: ECOG PERFORMANCE STATUS: 0 - Asymptomatic  Vitals:   07/30/20 1110  BP: 128/61  Pulse: 76  Resp: 18  Temp: 98.4 F (36.9 C)  SpO2: 99%   Filed Weights   07/30/20 1110  Weight: 196 lb 9.6 oz (89.2 kg)    GENERAL:alert, no distress and comfortable SKIN: skin color, texture, turgor are normal, no rashes or significant lesions EYES: normal, Conjunctiva are pink and non-injected, sclera clear  NECK: supple, thyroid normal size, non-tender, without nodularity LYMPH:  no palpable lymphadenopathy in the  cervical, axillary  LUNGS: clear to auscultation and percussion with normal breathing effort HEART: regular rate & rhythm and no murmurs and no lower extremity edema ABDOMEN:abdomen soft, non-tender and normal bowel sounds Musculoskeletal:no cyanosis of digits and no clubbing  NEURO: alert & oriented x 3 with fluent speech, no focal motor/sensory deficits BREAST: S/p B/l mastectomy: Surgical incision healed well. No palpable mass, nodules or adenopathy bilaterally. Breast exam benign.   LABORATORY DATA:  I have reviewed the data as listed CBC Latest Ref Rng & Units 07/30/2020 05/08/2020 05/05/2020  WBC 4.0 - 10.5 K/uL 3.0(L) 11.4(H) 4.0  Hemoglobin 12.0 - 15.0 g/dL 11.6(L) 11.3(L) 11.7(L)  Hematocrit 36.0 - 46.0 % 37.1 35.8(L) 36.7  Platelets 150 - 400 K/uL 180 161 179     CMP Latest Ref Rng & Units 07/30/2020 05/08/2020 01/15/2020  Glucose 70 - 99 mg/dL 81 138(H) 81  BUN 8 - 23 mg/dL 15 12 22   Creatinine 0.44 - 1.00 mg/dL 0.79 0.51 0.70  Sodium 135 - 145 mmol/L 141 134(L) 141  Potassium 3.5 - 5.1 mmol/L 4.2 3.5 4.5  Chloride 98 - 111 mmol/L 108 101 105  CO2 22 - 32 mmol/L 26 21(L) 23  Calcium 8.9 - 10.3 mg/dL 9.5 8.9 9.7  Total Protein 6.5 - 8.1 g/dL 7.1 - 6.7  Total Bilirubin 0.3 - 1.2 mg/dL 0.4 - 0.3  Alkaline Phos 38 - 126 U/L 66 - 71  AST 15 - 41 U/L 19 - 18  ALT 0 - 44 U/L 19 - 19      RADIOGRAPHIC STUDIES: I have personally reviewed the radiological images as listed and agreed with the findings in the report. No results found.   ASSESSMENT & PLAN:  Vanessa Christensen is a 70 y.o. female with   1. 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 She is s/p b/l breast mastectomy, adjuvant chemo AC, and anti-estrogen therapy with Anastrozole for 10 years.  -She is clinically doing well. Lab reviewed, her CBC and CMP are within normal limits except WBC 3, Hg 11.6, ANC 1.5.  Her physical exam and was unremarkable. There is no clinical concern for recurrence. -Continue surveillance. No need for mammograms as she is s/p b/l mastectomy -Her last DEXA in 02/2019 was normal. She can repeat in 2023.  -We discussed small risk of breast cancer later recurrence after 10 years  -f/u yearly with [name] per pt's preference.    2.  Hypothyroidism, depression, obesity -She'll continue follow-up with her primary care physician -She started participation with [facility] in early 2020. She notes so far she has been able to lose 53 pounds from diet and exercise. I encouraged her to continue.    3. Genetic Testing was normal and did not reveal a mutation in these genes.   Plan -Lab and f/u in 1 year with NP Lacie    No problem-specific Assessment & Plan notes found for this encounter.   No orders of the defined types were placed in this encounter.  All questions were answered. The patient knows to call the clinic with any problems, questions or concerns. No barriers to learning was detected. The total time spent in the appointment was 25 minutes.     Truitt Merle, MD 07/30/2020   I, Joslyn Devon, am acting as scribe for Truitt Merle, MD.   I have reviewed the above documentation for accuracy and completeness, and I agree with the above.

## 2020-07-29 ENCOUNTER — Encounter (INDEPENDENT_AMBULATORY_CARE_PROVIDER_SITE_OTHER): Payer: Self-pay | Admitting: Bariatrics

## 2020-07-29 NOTE — Progress Notes (Signed)
Chief Complaint:   OBESITY Vanessa Christensen is here to discuss her progress with her obesity treatment plan along with follow-up of her obesity related diagnoses. Vanessa Christensen is on the Category 3 Plan and states she is following her eating plan approximately 60% of the time. Vanessa Christensen states she is doing cardio and lifting weights for 60 minutes 2-3 times per week.  Today's visit was #: 4 Starting weight: 241 lbs Starting date: 08/13/2018 Today's weight: 190 lbs Today's date: 07/27/2020 Total lbs lost to date: 51 lbs Total lbs lost since last in-office visit: 2 lbs  Interim History: Vanessa Christensen is down an additional 2 pounds.  Subjective:   1. Vitamin D deficiency Vanessa Christensen's Vitamin D level was 36.9 on 01/15/2020. She is currently taking prescription vitamin D 50,000 IU every 14 days. She denies nausea, vomiting or muscle weakness.  2. Other specified hypothyroidism Vanessa Christensen is taking Synthroid 75 mcg Vanessa Christensen.  Lab Results  Component Value Date   TSH 1.670 01/15/2020   Assessment/Plan:   1. Vitamin D deficiency Low Vitamin D level contributes to fatigue and are associated with obesity, breast, and colon cancer. She agrees to continue to take prescription Vitamin D @50 ,000 IU every 14 days and will follow-up for routine testing of Vitamin D, at least 2-3 times per year to avoid over-replacement.  - Refill Vitamin D, Ergocalciferol, (DRISDOL) 1.25 MG (50000 UNIT) CAPS capsule; Take 1 capsule (50,000 Units total) by mouth every 14 (fourteen) days.  Dispense: 2 capsule; Refill: 0  2. Other specified hypothyroidism Patient with long-standing hypothyroidism, on levothyroxine therapy. She appears euthyroid. Orders and follow up as documented in patient record.  Continue Synthroid.  Counseling . Good thyroid control is important for overall health. Supratherapeutic thyroid levels are dangerous and will not improve weight loss results. . The correct way to take levothyroxine is fasting, with water, separated  by at least 30 minutes from breakfast, and separated by more than 4 hours from calcium, iron, multivitamins, acid reflux medications (PPIs).   3. Class 1 obesity due to excess calories with serious comorbidity and body mass index (BMI) of 34.0 to 34.9 in adult  Vanessa Christensen is currently in the action stage of change. As such, her goal is to continue with weight loss efforts. She has agreed to the Category 3 Plan.   She will work on meal planning, intentional eating, and increasing her protein intake.  Exercise goals: Finished PT on 07/19/2020 and will go back to the gym.  Behavioral modification strategies: increasing lean protein intake, decreasing simple carbohydrates, increasing vegetables, increasing water intake, decreasing eating out, no skipping meals, meal planning and cooking strategies, keeping healthy foods in the home and planning for success.  Vanessa Christensen has agreed to follow-up with our clinic in 2 weeks. She was informed of the importance of frequent follow-up visits to maximize her success with intensive lifestyle modifications for her multiple health conditions.   Objective:   Blood pressure (!) 141/79, pulse 78, temperature 97.9 F (36.6 C), height 5\' 2"  (1.575 m), weight 190 lb (86.2 kg), SpO2 97 %. Body mass index is 34.75 kg/m.  General: Cooperative, alert, well developed, in no acute distress. HEENT: Conjunctivae and lids unremarkable. Cardiovascular: Regular rhythm.  Lungs: Normal work of breathing. Neurologic: No focal deficits.   Lab Results  Component Value Date   CREATININE 0.51 05/08/2020   BUN 12 05/08/2020   NA 134 (L) 05/08/2020   K 3.5 05/08/2020   CL 101 05/08/2020   CO2 21 (L) 05/08/2020  Lab Results  Component Value Date   ALT 19 01/15/2020   AST 18 01/15/2020   ALKPHOS 71 01/15/2020   BILITOT 0.3 01/15/2020   Lab Results  Component Value Date   HGBA1C 5.6 01/15/2020   HGBA1C 5.4 11/04/2018   HGBA1C 5.6 08/13/2018   Lab Results  Component  Value Date   INSULIN 8.0 01/15/2020   INSULIN 10.9 11/04/2018   INSULIN 13.2 08/13/2018   Lab Results  Component Value Date   TSH 1.670 01/15/2020   Lab Results  Component Value Date   CHOL 188 01/15/2020   HDL 52 01/15/2020   LDLCALC 124 (H) 01/15/2020   TRIG 63 01/15/2020   CHOLHDL 4 05/08/2018   Lab Results  Component Value Date   WBC 11.4 (H) 05/08/2020   HGB 11.3 (L) 05/08/2020   HCT 35.8 (L) 05/08/2020   MCV 93.5 05/08/2020   PLT 161 05/08/2020   Obesity Behavioral Intervention:   Approximately 15 minutes were spent on the discussion below.  ASK: We discussed the diagnosis of obesity with Vanessa Christensen today and Vanessa Christensen agreed to give [name] permission to discuss obesity behavioral modification therapy today.  ASSESS: Vanessa Christensen has the diagnosis of obesity and her BMI today is 34.8. Vanessa Christensen is in the action stage of change.   ADVISE: Vanessa Christensen was educated on the multiple health risks of obesity as well as the benefit of weight loss to improve her health. She was advised of the need for long term treatment and the importance of lifestyle modifications to improve her current health and to decrease her risk of future health problems.  AGREE: Multiple dietary modification options and treatment options were discussed and Vanessa Christensen agreed to follow the recommendations documented in the above note.  ARRANGE: Vanessa Christensen was educated on the importance of frequent visits to treat obesity as outlined per CMS and USPSTF guidelines and agreed to schedule her next follow up appointment today.  Attestation Statements:   Reviewed by clinician on day of visit: allergies, medications, problem list, medical history, surgical history, family history, social history, and previous encounter notes.  I, Water quality scientist, CMA, am acting as Location manager for [employer], DO  I have reviewed the above documentation for accuracy and completeness, and I agree with the above. Jearld Lesch, DO

## 2020-07-30 ENCOUNTER — Telehealth: Payer: Self-pay | Admitting: Hematology

## 2020-07-30 ENCOUNTER — Encounter: Payer: Self-pay | Admitting: Hematology

## 2020-07-30 ENCOUNTER — Inpatient Hospital Stay: Payer: Medicare Other | Attending: Hematology | Admitting: Hematology

## 2020-07-30 ENCOUNTER — Inpatient Hospital Stay: Payer: Medicare Other

## 2020-07-30 ENCOUNTER — Other Ambulatory Visit: Payer: Self-pay

## 2020-07-30 VITALS — BP 128/61 | HR 76 | Temp 98.4°F | Resp 18 | Ht 62.0 in | Wt 196.6 lb

## 2020-07-30 DIAGNOSIS — E669 Obesity, unspecified: Secondary | ICD-10-CM | POA: Insufficient documentation

## 2020-07-30 DIAGNOSIS — E039 Hypothyroidism, unspecified: Secondary | ICD-10-CM | POA: Diagnosis not present

## 2020-07-30 DIAGNOSIS — Z170 Estrogen receptor positive status [ER+]: Secondary | ICD-10-CM | POA: Insufficient documentation

## 2020-07-30 DIAGNOSIS — Z9013 Acquired absence of bilateral breasts and nipples: Secondary | ICD-10-CM | POA: Diagnosis not present

## 2020-07-30 DIAGNOSIS — Z9221 Personal history of antineoplastic chemotherapy: Secondary | ICD-10-CM | POA: Insufficient documentation

## 2020-07-30 DIAGNOSIS — C50412 Malignant neoplasm of upper-outer quadrant of left female breast: Secondary | ICD-10-CM

## 2020-07-30 DIAGNOSIS — F32A Depression, unspecified: Secondary | ICD-10-CM | POA: Insufficient documentation

## 2020-07-30 LAB — CBC WITH DIFFERENTIAL/PLATELET
Abs Immature Granulocytes: 0.01 10*3/uL (ref 0.00–0.07)
Basophils Absolute: 0 10*3/uL (ref 0.0–0.1)
Basophils Relative: 0 %
Eosinophils Absolute: 0 10*3/uL (ref 0.0–0.5)
Eosinophils Relative: 0 %
HCT: 37.1 % (ref 36.0–46.0)
Hemoglobin: 11.6 g/dL — ABNORMAL LOW (ref 12.0–15.0)
Immature Granulocytes: 0 %
Lymphocytes Relative: 37 %
Lymphs Abs: 1.1 10*3/uL (ref 0.7–4.0)
MCH: 27.5 pg (ref 26.0–34.0)
MCHC: 31.3 g/dL (ref 30.0–36.0)
MCV: 87.9 fL (ref 80.0–100.0)
Monocytes Absolute: 0.4 10*3/uL (ref 0.1–1.0)
Monocytes Relative: 13 %
Neutro Abs: 1.5 10*3/uL — ABNORMAL LOW (ref 1.7–7.7)
Neutrophils Relative %: 50 %
Platelets: 180 10*3/uL (ref 150–400)
RBC: 4.22 MIL/uL (ref 3.87–5.11)
RDW: 14.6 % (ref 11.5–15.5)
WBC: 3 10*3/uL — ABNORMAL LOW (ref 4.0–10.5)
nRBC: 0 % (ref 0.0–0.2)

## 2020-07-30 LAB — COMPREHENSIVE METABOLIC PANEL
ALT: 19 U/L (ref 0–44)
AST: 19 U/L (ref 15–41)
Albumin: 4.2 g/dL (ref 3.5–5.0)
Alkaline Phosphatase: 66 U/L (ref 38–126)
Anion gap: 7 (ref 5–15)
BUN: 15 mg/dL (ref 8–23)
CO2: 26 mmol/L (ref 22–32)
Calcium: 9.5 mg/dL (ref 8.9–10.3)
Chloride: 108 mmol/L (ref 98–111)
Creatinine, Ser: 0.79 mg/dL (ref 0.44–1.00)
GFR, Estimated: >60 mL/min (ref >60)
Glucose, Bld: 81 mg/dL (ref 70–99)
Potassium: 4.2 mmol/L (ref 3.5–5.1)
Sodium: 141 mmol/L (ref 135–145)
Total Bilirubin: 0.4 mg/dL (ref 0.3–1.2)
Total Protein: 7.1 g/dL (ref 6.5–8.1)

## 2020-07-30 NOTE — Telephone Encounter (Signed)
Left message with follow-up appointment per 2/25 los. Gave option to call back to reschedule if needed.

## 2020-08-06 DIAGNOSIS — H401131 Primary open-angle glaucoma, bilateral, mild stage: Secondary | ICD-10-CM | POA: Diagnosis not present

## 2020-08-06 DIAGNOSIS — H524 Presbyopia: Secondary | ICD-10-CM | POA: Diagnosis not present

## 2020-08-06 DIAGNOSIS — H52203 Unspecified astigmatism, bilateral: Secondary | ICD-10-CM | POA: Diagnosis not present

## 2020-08-10 ENCOUNTER — Other Ambulatory Visit: Payer: Self-pay

## 2020-08-10 ENCOUNTER — Ambulatory Visit (INDEPENDENT_AMBULATORY_CARE_PROVIDER_SITE_OTHER): Payer: Medicare Other | Admitting: Bariatrics

## 2020-08-10 ENCOUNTER — Encounter (INDEPENDENT_AMBULATORY_CARE_PROVIDER_SITE_OTHER): Payer: Self-pay | Admitting: Bariatrics

## 2020-08-10 VITALS — BP 140/82 | HR 77 | Temp 98.1°F | Ht 62.0 in | Wt 192.0 lb

## 2020-08-10 DIAGNOSIS — E162 Hypoglycemia, unspecified: Secondary | ICD-10-CM | POA: Diagnosis not present

## 2020-08-10 DIAGNOSIS — E039 Hypothyroidism, unspecified: Secondary | ICD-10-CM

## 2020-08-10 DIAGNOSIS — Z6835 Body mass index (BMI) 35.0-35.9, adult: Secondary | ICD-10-CM

## 2020-08-12 ENCOUNTER — Encounter (INDEPENDENT_AMBULATORY_CARE_PROVIDER_SITE_OTHER): Payer: Self-pay | Admitting: Bariatrics

## 2020-08-12 NOTE — Progress Notes (Signed)
Chief Complaint:   OBESITY Vanessa Christensen is here to discuss her progress with her obesity treatment plan along with follow-up of her obesity related diagnoses. Vanessa Christensen is on the Category 3 Plan and states she is following her eating plan approximately 60% of the time. Vanessa Christensen states she is doing cardio and strength training for 60 minutes 3-4 times per week.  Today's visit was #: 92 Starting weight: 241 lbs Starting date: 08/13/2018 Today's weight: 192 lbs Today's date: 08/10/2020 Total lbs lost to date: 49 lbs Total lbs lost since last in-office visit: 0  Interim History: Vanessa Christensen is up 2 pounds since her last visit.  She has not been able to exercise as much.  Subjective:   1. Hypoglycemia Occasionally shaky.  Not enough protein.  2. Hypothyroidism, unspecified type Taking Synthroid.  Feeling cold.  Lab Results  Component Value Date   TSH 1.670 01/15/2020   Assessment/Plan:   1. Hypoglycemia Sheet given on hypoglycemia.  Increase protein.  Frequent, small meals.  2. Hypothyroidism, unspecified type Patient with long-standing hypothyroidism, on levothyroxine therapy. She appears euthyroid. Orders and follow up as documented in patient record.  Continue Synthroid.  Will see PCP and have thyroid checked.  Counseling . Good thyroid control is important for overall health. Supratherapeutic thyroid levels are dangerous and will not improve weight loss results. . The correct way to take levothyroxine is fasting, with water, separated by at least 30 minutes from breakfast, and separated by more than 4 hours from calcium, iron, multivitamins, acid reflux medications (PPIs).   3. Class 2 severe obesity due to excess calories with serious comorbidity and body mass index (BMI) of 35.0 to 35.9 in adult Vanessa Christensen)  Vanessa Christensen is currently in the action stage of change. As such, her goal is to continue with weight loss efforts. She has agreed to the Category 3 Plan.   She will work on meal planning,  mindful eating, and increasing her protein intake.  Exercise goals: See above.  Continue exercise.  Behavioral modification strategies: increasing lean protein intake, decreasing simple carbohydrates, increasing vegetables, increasing water intake, decreasing eating out, no skipping meals, meal planning and cooking strategies, keeping healthy foods in the home and planning for success.  Vanessa Christensen has agreed to follow-up with our clinic in 2-3 weeks. She was informed of the importance of frequent follow-up visits to maximize her success with intensive lifestyle modifications for her multiple health conditions.   Objective:   Blood pressure 140/82, pulse 77, temperature 98.1 F (36.7 C), height 5\' 2"  (1.575 m), weight 192 lb (87.1 kg), SpO2 96 %. Body mass index is 35.12 kg/m.  General: Cooperative, alert, well developed, in no acute distress. HEENT: Conjunctivae and lids unremarkable. Cardiovascular: Regular rhythm.  Lungs: Normal work of breathing. Neurologic: No focal deficits.   Lab Results  Component Value Date   CREATININE 0.79 07/30/2020   BUN 15 07/30/2020   NA 141 07/30/2020   K 4.2 07/30/2020   CL 108 07/30/2020   CO2 26 07/30/2020   Lab Results  Component Value Date   ALT 19 07/30/2020   AST 19 07/30/2020   ALKPHOS 66 07/30/2020   BILITOT 0.4 07/30/2020   Lab Results  Component Value Date   HGBA1C 5.6 01/15/2020   HGBA1C 5.4 11/04/2018   HGBA1C 5.6 08/13/2018   Lab Results  Component Value Date   INSULIN 8.0 01/15/2020   INSULIN 10.9 11/04/2018   INSULIN 13.2 08/13/2018   Lab Results  Component Value Date  TSH 1.670 01/15/2020   Lab Results  Component Value Date   CHOL 188 01/15/2020   HDL 52 01/15/2020   LDLCALC 124 (H) 01/15/2020   TRIG 63 01/15/2020   CHOLHDL 4 05/08/2018   Lab Results  Component Value Date   WBC 3.0 (L) 07/30/2020   HGB 11.6 (L) 07/30/2020   HCT 37.1 07/30/2020   MCV 87.9 07/30/2020   PLT 180 07/30/2020   Obesity  Behavioral Intervention:   Approximately 15 minutes were spent on the discussion below.  ASK: We discussed the diagnosis of obesity with Vanessa Christensen today and Vanessa Christensen agreed to give [name] permission to discuss obesity behavioral modification therapy today.  ASSESS: Vanessa Christensen has the diagnosis of obesity and her BMI today is 35.2. Vanessa Christensen is in the action stage of change.   ADVISE: Vanessa Christensen was educated on the multiple health risks of obesity as well as the benefit of weight loss to improve her health. She was advised of the need for long term treatment and the importance of lifestyle modifications to improve her current health and to decrease her risk of future health problems.  AGREE: Multiple dietary modification options and treatment options were discussed and [place] agreed to follow the recommendations documented in the above note.  ARRANGE: Vanessa Christensen was educated on the importance of frequent visits to treat obesity as outlined per CMS and USPSTF guidelines and agreed to schedule her next follow up appointment today.  Attestation Statements:   Reviewed by clinician on day of visit: allergies, medications, problem list, medical history, surgical history, family history, social history, and previous encounter notes.  I, Water quality scientist, CMA, am acting as Location manager for [employer], DO  I have reviewed the above documentation for accuracy and completeness, and I agree with the above. Jearld Lesch, DO

## 2020-08-22 ENCOUNTER — Other Ambulatory Visit (INDEPENDENT_AMBULATORY_CARE_PROVIDER_SITE_OTHER): Payer: Self-pay | Admitting: Bariatrics

## 2020-08-22 DIAGNOSIS — E559 Vitamin D deficiency, unspecified: Secondary | ICD-10-CM

## 2020-08-23 NOTE — Telephone Encounter (Signed)
Last OV with Dr Brown 

## 2020-08-25 ENCOUNTER — Ambulatory Visit (INDEPENDENT_AMBULATORY_CARE_PROVIDER_SITE_OTHER): Payer: Medicare Other | Admitting: Bariatrics

## 2020-08-25 ENCOUNTER — Other Ambulatory Visit: Payer: Self-pay

## 2020-08-25 ENCOUNTER — Encounter (INDEPENDENT_AMBULATORY_CARE_PROVIDER_SITE_OTHER): Payer: Self-pay | Admitting: Bariatrics

## 2020-08-25 VITALS — BP 123/68 | HR 88 | Temp 99.1°F | Ht 62.0 in | Wt 189.0 lb

## 2020-08-25 DIAGNOSIS — E559 Vitamin D deficiency, unspecified: Secondary | ICD-10-CM | POA: Diagnosis not present

## 2020-08-25 DIAGNOSIS — E8881 Metabolic syndrome: Secondary | ICD-10-CM | POA: Diagnosis not present

## 2020-08-25 DIAGNOSIS — Z6836 Body mass index (BMI) 36.0-36.9, adult: Secondary | ICD-10-CM | POA: Diagnosis not present

## 2020-08-25 MED ORDER — VITAMIN D (ERGOCALCIFEROL) 1.25 MG (50000 UNIT) PO CAPS: 50000.0000 [IU] | ORAL_CAPSULE | ORAL | 0 refills | Status: DC

## 2020-08-26 ENCOUNTER — Ambulatory Visit: Payer: Medicare Other

## 2020-08-27 ENCOUNTER — Telehealth: Payer: Medicare Other

## 2020-08-31 NOTE — Progress Notes (Signed)
Chief Complaint:   OBESITY Vanessa Christensen is here to discuss her progress with her obesity treatment plan along with follow-up of her obesity related diagnoses. Vanessa Christensen is on the Category 3 Plan and states she is following her eating plan approximately 80% of the time. Vanessa Christensen states she is doing cardio and strength training for 60 minutes 2-3 times per week.  Today's visit was #: 66 Starting weight: 241 lbs Starting date: 08/13/2018 Today's weight: 189 lbs Today's date: 08/25/2020 Total lbs lost to date: 52 lbs Total lbs lost since last in-office visit: 3 lbs  Interim History: Vanessa Christensen is down an additional 3 pounds.  She is doing well with her protein and water.  Subjective:   1. Vitamin D deficiency Marc's Vitamin D level was 36.9 on 01/15/2020. She is currently taking prescription vitamin D 50,000 IU each week. She denies nausea, vomiting or muscle weakness.    2. Insulin resistance Vanessa Christensen has a diagnosis of insulin resistance based on her elevated fasting insulin level >5. She continues to work on diet and exercise to decrease her risk of diabetes.  She is on no medications for this.  Lab Results  Component Value Date   INSULIN 8.0 01/15/2020   INSULIN 10.9 11/04/2018   INSULIN 13.2 08/13/2018   Lab Results  Component Value Date   HGBA1C 5.6 01/15/2020   Assessment/Plan:   1. Vitamin D deficiency Low Vitamin D level contributes to fatigue and are associated with obesity, breast, and colon cancer. She agrees to continue to take prescription Vitamin D @50 ,000 IU every week and will follow-up for routine testing of Vitamin D, at least 2-3 times per year to avoid over-replacement.  - Refill Vitamin D, Ergocalciferol, (DRISDOL) 1.25 MG (50000 UNIT) CAPS capsule; Take 1 capsule (50,000 Units total) by mouth every 14 (fourteen) days.  Dispense: 2 capsule; Refill: 0  2. Insulin resistance Vanessa Christensen will continue to work on weight loss, exercise, and decreasing simple carbohydrates to  help decrease the risk of diabetes. Vanessa Christensen agreed to follow-up with [name] as directed to closely monitor her progress.  Continue exercise and diet.  Decrease carbohydrates.  3. Class 2 severe obesity with serious comorbidity and body mass index (BMI) of 36.0 to 36.9 in adult, unspecified obesity type Vanessa Christensen)  Vanessa Christensen is currently in the action stage of change. As such, her goal is to continue with weight loss efforts. She has agreed to the Category 3 Plan.   She will work on meal planning and intentional eating.  Exercise goals: Continue cardio and strength training.  Behavioral modification strategies: increasing lean protein intake, decreasing simple carbohydrates, increasing vegetables, increasing water intake, decreasing eating out, no skipping meals, meal planning and cooking strategies, keeping healthy foods in the home and planning for success.  Vanessa Christensen has agreed to follow-up with our clinic in 2-3 weeks. She was informed of the importance of frequent follow-up visits to maximize her success with intensive lifestyle modifications for her multiple health conditions.   Objective:   Blood pressure 123/68, pulse 88, temperature 99.1 F (37.3 C), height 5\' 2"  (1.575 m), weight 189 lb (85.7 kg), SpO2 98 %. Body mass index is 34.57 kg/m.  General: Cooperative, alert, well developed, in no acute distress. HEENT: Conjunctivae and lids unremarkable. Cardiovascular: Regular rhythm.  Lungs: Normal work of breathing. Neurologic: No focal deficits.   Lab Results  Component Value Date   CREATININE 0.79 07/30/2020   BUN 15 07/30/2020   NA 141 07/30/2020   K 4.2 07/30/2020  CL 108 07/30/2020   CO2 26 07/30/2020   Lab Results  Component Value Date   ALT 19 07/30/2020   AST 19 07/30/2020   ALKPHOS 66 07/30/2020   BILITOT 0.4 07/30/2020   Lab Results  Component Value Date   HGBA1C 5.6 01/15/2020   HGBA1C 5.4 11/04/2018   HGBA1C 5.6 08/13/2018   Lab Results  Component Value Date    INSULIN 8.0 01/15/2020   INSULIN 10.9 11/04/2018   INSULIN 13.2 08/13/2018   Lab Results  Component Value Date   TSH 1.670 01/15/2020   Lab Results  Component Value Date   CHOL 188 01/15/2020   HDL 52 01/15/2020   LDLCALC 124 (H) 01/15/2020   TRIG 63 01/15/2020   CHOLHDL 4 05/08/2018   Lab Results  Component Value Date   WBC 3.0 (L) 07/30/2020   HGB 11.6 (L) 07/30/2020   HCT 37.1 07/30/2020   MCV 87.9 07/30/2020   PLT 180 07/30/2020   Obesity Behavioral Intervention:   Approximately 15 minutes were spent on the discussion below.  ASK: We discussed the diagnosis of obesity with Vanessa Christensen today and Vanessa Christensen agreed to give [name] permission to discuss obesity behavioral modification therapy today.  ASSESS: Vanessa Christensen has the diagnosis of obesity and her BMI today is 34.7. Vanessa Christensen is in the action stage of change.   ADVISE: Vanessa Christensen was educated on the multiple health risks of obesity as well as the benefit of weight loss to improve her health. She was advised of the need for long term treatment and the importance of lifestyle modifications to improve her current health and to decrease her risk of future health problems.  AGREE: Multiple dietary modification options and treatment options were discussed and Vanessa Christensen agreed to follow the recommendations documented in the above note.  ARRANGE: Vanessa Christensen was educated on the importance of frequent visits to treat obesity as outlined per CMS and USPSTF guidelines and agreed to schedule her next follow up appointment today.  Attestation Statements:   Reviewed by clinician on day of visit: allergies, medications, problem list, medical history, surgical history, family history, social history, and previous encounter notes.  I, Water quality scientist, CMA, am acting as Location manager for [employer], DO  I have reviewed the above documentation for accuracy and completeness, and I agree with the above. Jearld Lesch, DO

## 2020-09-09 ENCOUNTER — Encounter (INDEPENDENT_AMBULATORY_CARE_PROVIDER_SITE_OTHER): Payer: Self-pay | Admitting: Bariatrics

## 2020-09-09 ENCOUNTER — Ambulatory Visit (INDEPENDENT_AMBULATORY_CARE_PROVIDER_SITE_OTHER): Payer: Medicare Other | Admitting: Bariatrics

## 2020-09-09 ENCOUNTER — Other Ambulatory Visit: Payer: Self-pay

## 2020-09-09 VITALS — BP 143/85 | HR 82 | Temp 98.3°F | Ht 62.0 in | Wt 188.0 lb

## 2020-09-09 DIAGNOSIS — Z6841 Body Mass Index (BMI) 40.0 and over, adult: Secondary | ICD-10-CM

## 2020-09-09 DIAGNOSIS — E038 Other specified hypothyroidism: Secondary | ICD-10-CM

## 2020-09-09 DIAGNOSIS — E559 Vitamin D deficiency, unspecified: Secondary | ICD-10-CM

## 2020-09-09 MED ORDER — VITAMIN D (ERGOCALCIFEROL) 1.25 MG (50000 UNIT) PO CAPS: 50000.0000 [IU] | ORAL_CAPSULE | ORAL | 0 refills | Status: DC

## 2020-09-13 NOTE — Progress Notes (Signed)
Chief Complaint:   OBESITY Vanessa Christensen is here to discuss her progress with her obesity treatment plan along with follow-up of her obesity related diagnoses. Vanessa Christensen is on the Category 3 Plan and states she is following her eating plan approximately 75% of the time. Vanessa Christensen states she is not currently exercising.  Today's visit was #: 81 Starting weight: 241 lbs Starting date: 08/13/2018 Today's weight: 188 lbs Today's date: 09/09/2020 Total lbs lost to date: 39  Total lbs lost since last in-office visit: 1  Interim History: Vanessa Christensen is down 1 lb since her last visit and doing well. She has not been exercising. She is increasing her water.  Subjective:   1. Vitamin D deficiency Vanessa Christensen's Vit D level 36.9. She reports minimal sun exposure.   Ref. Range 01/15/2020 12:54  Vitamin D, 25-Hydroxy Latest Ref Range: 30.0 - 100.0 ng/mL 36.9    2. Other specified hypothyroidism Vanessa Christensen is taking Synthroid.  Assessment/Plan:   1. Vitamin D deficiency Low Vitamin D level contributes to fatigue and are associated with obesity, breast, and colon cancer. She agrees to continue to take prescription Vitamin D @50 ,000 IU every week and will follow-up for routine testing of Vitamin D, at least 2-3 times per year to avoid over-replacement.   - Vitamin D, Ergocalciferol, (DRISDOL) 1.25 MG (50000 UNIT) CAPS capsule; Take 1 capsule (50,000 Units total) by mouth every 14 (fourteen) days.  Dispense: 2 capsule; Refill: 0  2. Other specified hypothyroidism Patient with long-standing hypothyroidism, on levothyroxine therapy. She appears euthyroid. Orders and follow up as documented in patient record. Continue current treatment plan.  Counseling . Good thyroid control is important for overall health. Supratherapeutic thyroid levels are dangerous and will not improve weight loss results. . The correct way to take levothyroxine is fasting, with water, separated by at least 30 minutes from breakfast, and separated  by more than 4 hours from calcium, iron, multivitamins, acid reflux medications (PPIs).   3. Obesity, current BMI 34 Vanessa Christensen is currently in the action stage of change. As such, her goal is to continue with weight loss efforts. She has agreed to the Category 3 Plan.   Will increase exercise.  Exercise goals: Will remain exercising  Behavioral modification strategies: increasing lean protein intake, decreasing simple carbohydrates, increasing vegetables, increasing water intake, decreasing eating out, no skipping meals, meal planning and cooking strategies, keeping healthy foods in the home and planning for success.  Vanessa Christensen has agreed to follow-up with our clinic in 2-3 weeks. She was informed of the importance of frequent follow-up visits to maximize her success with intensive lifestyle modifications for her multiple health conditions.   Objective:   Blood pressure (!) 143/85, pulse 82, temperature 98.3 F (36.8 C), height 5\' 2"  (1.575 m), weight 188 lb (85.3 kg). Body mass index is 34.39 kg/m.  General: Cooperative, alert, well developed, in no acute distress. HEENT: Conjunctivae and lids unremarkable. Cardiovascular: Regular rhythm.  Lungs: Normal work of breathing. Neurologic: No focal deficits.   Lab Results  Component Value Date   CREATININE 0.79 07/30/2020   BUN 15 07/30/2020   NA 141 07/30/2020   K 4.2 07/30/2020   CL 108 07/30/2020   CO2 26 07/30/2020   Lab Results  Component Value Date   ALT 19 07/30/2020   AST 19 07/30/2020   ALKPHOS 66 07/30/2020   BILITOT 0.4 07/30/2020   Lab Results  Component Value Date   HGBA1C 5.6 01/15/2020   HGBA1C 5.4 11/04/2018   HGBA1C 5.6  08/13/2018   Lab Results  Component Value Date   INSULIN 8.0 01/15/2020   INSULIN 10.9 11/04/2018   INSULIN 13.2 08/13/2018   Lab Results  Component Value Date   TSH 1.670 01/15/2020   Lab Results  Component Value Date   CHOL 188 01/15/2020   HDL 52 01/15/2020   LDLCALC 124 (H)  01/15/2020   TRIG 63 01/15/2020   CHOLHDL 4 05/08/2018   Lab Results  Component Value Date   WBC 3.0 (L) 07/30/2020   HGB 11.6 (L) 07/30/2020   HCT 37.1 07/30/2020   MCV 87.9 07/30/2020   PLT 180 07/30/2020   No results found for: IRON, TIBC, FERRITIN  Obesity Behavioral Intervention:   Approximately 15 minutes were spent on the discussion below.  ASK: We discussed the diagnosis of obesity with Vanessa Christensen today and Vanessa Christensen agreed to give [name] permission to discuss obesity behavioral modification therapy today.  ASSESS: Vanessa Christensen has the diagnosis of obesity and her BMI today is 34.5. Vanessa Christensen is in the action stage of change.   ADVISE: Vanessa Christensen was educated on the multiple health risks of obesity as well as the benefit of weight loss to improve her health. She was advised of the need for long term treatment and the importance of lifestyle modifications to improve her current health and to decrease her risk of future health problems.  AGREE: Multiple dietary modification options and treatment options were discussed and Vanessa Christensen agreed to follow the recommendations documented in the above note.  ARRANGE: Vanessa Christensen was educated on the importance of frequent visits to treat obesity as outlined per CMS and USPSTF guidelines and agreed to schedule her next follow up appointment today.  Attestation Statements:   Reviewed by clinician on day of visit: allergies, medications, problem list, medical history, surgical history, family history, social history, and previous encounter notes.  Coral Ceo, am acting as Location manager for [employer], DO.  I have reviewed the above documentation for accuracy and completeness, and I agree with the above. Jearld Lesch, DO

## 2020-09-14 ENCOUNTER — Encounter (INDEPENDENT_AMBULATORY_CARE_PROVIDER_SITE_OTHER): Payer: Self-pay | Admitting: Bariatrics

## 2020-09-15 ENCOUNTER — Encounter: Payer: Self-pay | Admitting: Orthopaedic Surgery

## 2020-09-15 ENCOUNTER — Other Ambulatory Visit: Payer: Self-pay

## 2020-09-15 ENCOUNTER — Ambulatory Visit (INDEPENDENT_AMBULATORY_CARE_PROVIDER_SITE_OTHER): Payer: Medicare Other | Admitting: Orthopaedic Surgery

## 2020-09-15 ENCOUNTER — Ambulatory Visit (INDEPENDENT_AMBULATORY_CARE_PROVIDER_SITE_OTHER): Payer: Medicare Other

## 2020-09-15 ENCOUNTER — Telehealth: Payer: Self-pay

## 2020-09-15 DIAGNOSIS — Z96651 Presence of right artificial knee joint: Secondary | ICD-10-CM | POA: Diagnosis not present

## 2020-09-15 NOTE — Progress Notes (Signed)
Office Visit Note   Patient: Vanessa Christensen           Date of Birth: 08/04/1950           MRN: [id number] Visit Date: 09/15/2020              Requested by: [name], MD [address] [name] [address] [place],  [place] [address] PCP: [name], MD   Assessment & Plan: Visit Diagnoses:  1. History of total right knee replacement     Plan: Since she is doing so well follow-up can be as needed.  If this will has any issues at all she knows to come see [name].  Also if she starts developing worsening left knee pain she will let us know.  Follow-Up Instructions: Return if symptoms worsen or fail to improve.   Orders:  Orders Placed This Encounter  Procedures  . XR Knee 1-2 Views Right   No orders of the defined types were placed in this encounter.     Procedures: No procedures performed   Clinical Data: No additional findings.   Subjective: Chief Complaint  Patient presents with  . Right Knee - Follow-up  The patient is a very pleasant 70 year old female who is just over 4 months out from revision arthroplasty for her right knee.  She [place] right [place]  She ended up developing loosening of the tibial component.  We trigger the operating room in early December of this past year and revise the tibial component and a new liner.  We had placed a longstem.  She states she is doing great has good range of motion strength and no discomfort.  She is walking without an assistive device.  She does have known arthritis in her left knee and is bothering her some but not enough to do anything about.  She has had no acute change in medical status.  HPI  Review of Systems There is currently listed no headache, chest pain, shortness of breath, fever, chills, nausea,  Objective: Vital Signs: There were no vitals taken for this visit.  Physical Exam She is alert and orient x3 and in no acute distress Ortho Exam Examination of her right  operative knee shows this ligamentously stable and has good range of motion.  The incision is healed.  The swelling is minimal. Specialty Comments:  No specialty comments available.  Imaging: XR Knee 1-2 Views Right  Result Date: 09/15/2020 2 views of the right knee show well-seated revision arthroplasty with no complicating features.  The AP view also shows a left knee that does have medial compartment arthritis.    PMFS History: Patient Active Problem List   Diagnosis Date Noted  . Status post revision of total replacement of right knee 05/07/2020  . Failed total knee, right, 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/2020  . History of total right knee replacement 01/28/2020  . Vitamin D deficiency 08/29/2018  . 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 Class 3 severe obesity with serious comorbidity and body mass index (BMI) of 40.0 to 44.9 in adult ([place]) 08/14/2018  . 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 Incontinence of urine in female 11/07/2017  . Vertigo 11/07/2017  . Left knee pain 10/25/2016  . Genetic testing 08/08/2016  . Hypothyroidism 12/02/2015  . Anxiety and depression 12/02/2015  . OAB (overactive bladder) 12/02/2015  . 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 Obesity (BMI 30-39.9) 12/02/2015  Past Medical History:  Diagnosis Date  . Anemia    hx of  . Anxiety    pt denies  . Arthritis    hands and feet  . Cancer ([place]) 10/04/2006   breast  . Cataracts, both eyes   . Depression   . Depression   . Diverticulosis of colon   . Glaucoma   . Heart murmur    No work up done 5 years ago  . Joint pain   . Knee pain   . Obesity   . OSA on CPAP   . Osteoarthritis   . Sleep apnea    no CPAP- no longer needed d/t weight loss  . Thyroid activity decreased     Family History  Problem Relation Age of Onset  . CVA Mother   . Cancer Mother 3       breast cancer   . Heart disease Mother   . Stroke Mother   . Obesity Mother   . Leukemia  Father   . High blood pressure Father   . High Cholesterol Father   . Heart disease Father   . Cancer Maternal Aunt 55       breast cancer   . Cancer Maternal Aunt 55       breast cancer  . Colon cancer Maternal Aunt   . Cancer Cousin 32       breast cancer  . Esophageal cancer Neg Hx   . Rectal cancer Neg Hx   . Stomach cancer Neg Hx     Past Surgical History:  Procedure Laterality Date  . BREAST SURGERY Bilateral   . CATARACT EXTRACTION    . COLONOSCOPY    . GLAUCOMA REPAIR    . MASTECTOMY, RADICAL Bilateral   . neulasta induced sterile abscesses    . THYROIDECTOMY, PARTIAL    . TONSILLECTOMY    . TOTAL KNEE ARTHROPLASTY Right   . TOTAL KNEE REVISION Right 05/07/2020   Procedure: RIGHT TOTAL KNEE REVISION ARTHROPLASTY;  Surgeon: Mcarthur Rossetti, MD;  Location: [facility];  Service: Orthopedics;  Laterality: Right;   Social History   Occupational History  . Occupation: Retired    Comment: Pharmacist, hospital  Tobacco Use  . Smoking status: Former Smoker    Quit date: 11/03/1978    Years since quitting: 41.8  . Smokeless tobacco: Never Used  Vaping Use  . Vaping Use: Never used  Substance and Sexual Activity  . Alcohol use: No  . Drug use: No  . Sexual activity: Not Currently    Birth control/protection: Post-menopausal

## 2020-09-15 NOTE — Telephone Encounter (Signed)
Pt is s/p a revision of a total knee 05/2020 Dental office called requesting premed instructions. Advised that per Dr. Trevor Mace protocol premed only required first three months post op. This was faxed to att: Olivia Mackie to [phone]

## 2020-09-20 ENCOUNTER — Other Ambulatory Visit (INDEPENDENT_AMBULATORY_CARE_PROVIDER_SITE_OTHER): Payer: Self-pay | Admitting: Bariatrics

## 2020-09-20 DIAGNOSIS — E559 Vitamin D deficiency, unspecified: Secondary | ICD-10-CM

## 2020-10-04 ENCOUNTER — Ambulatory Visit (INDEPENDENT_AMBULATORY_CARE_PROVIDER_SITE_OTHER): Payer: Medicare Other | Admitting: Bariatrics

## 2020-10-04 ENCOUNTER — Other Ambulatory Visit: Payer: Self-pay

## 2020-10-04 ENCOUNTER — Encounter (INDEPENDENT_AMBULATORY_CARE_PROVIDER_SITE_OTHER): Payer: Self-pay | Admitting: Bariatrics

## 2020-10-04 VITALS — BP 125/82 | HR 79 | Temp 98.0°F | Ht 62.0 in | Wt 189.0 lb

## 2020-10-04 DIAGNOSIS — E039 Hypothyroidism, unspecified: Secondary | ICD-10-CM

## 2020-10-04 DIAGNOSIS — Z6841 Body Mass Index (BMI) 40.0 and over, adult: Secondary | ICD-10-CM | POA: Diagnosis not present

## 2020-10-04 DIAGNOSIS — E559 Vitamin D deficiency, unspecified: Secondary | ICD-10-CM | POA: Diagnosis not present

## 2020-10-04 MED ORDER — VITAMIN D (ERGOCALCIFEROL) 1.25 MG (50000 UNIT) PO CAPS: 50000.0000 [IU] | ORAL_CAPSULE | ORAL | 0 refills | Status: DC

## 2020-10-05 NOTE — Progress Notes (Signed)
Chief Complaint:   OBESITY Vanessa Christensen is here to discuss her progress with her obesity treatment plan along with follow-up of her obesity related diagnoses. Vanessa Christensen is on the Category 3 Plan and states she is following her eating plan approximately 30% of the time. Vanessa Christensen states she is doing cardio and strength training 60 minutes 2 times per week.  Today's visit was #: 5 Starting weight: 241 lbs Starting date: 08/13/2018 Today's weight: 189 lbs Today's date: 10/04/2020 Total lbs lost to date: 52 Total lbs lost since last in-office visit: 0  Interim History: Vanessa Christensen is up 1 lb since her last visit. She states that she gained weight over Easter due to more candy. She is doing well with water.  Subjective:   1. Hypothyroidism, unspecified type Vanessa Christensen is controlled on Synthroid.   2. Vitamin D deficiency Pt denies nausea, vomiting, and muscle weakness. Pt is on prescription Vit D.  Assessment/Plan:   1. Hypothyroidism, unspecified type Patient with long-standing hypothyroidism, on levothyroxine therapy. She appears euthyroid. Orders and follow up as documented in patient record. Continue Synthroid.  Counseling . Good thyroid control is important for overall health. Supratherapeutic thyroid levels are dangerous and will not improve weight loss results. . The correct way to take levothyroxine is fasting, with water, separated by at least 30 minutes from breakfast, and separated by more than 4 hours from calcium, iron, multivitamins, acid reflux medications (PPIs).   2. Vitamin D deficiency Low Vitamin D level contributes to fatigue and are associated with obesity, breast, and colon cancer. She agrees to continue to take prescription Vitamin D @50 ,000 IU every 14 days and will follow-up for routine testing of Vitamin D, at least 2-3 times per year to avoid over-replacement.  - Vitamin D, Ergocalciferol, (DRISDOL) 1.25 MG (50000 UNIT) CAPS capsule; Take 1 capsule (50,000 Units total) by  mouth every 14 (fourteen) days.  Dispense: 2 capsule; Refill: 0  3. Obesity, current BMI 34  Vanessa Christensen is currently in the action stage of change. As such, her goal is to continue with weight loss efforts. She has agreed to the Category 3 Plan.   Meal plan Intentional eating  Exercise goals: As is  Behavioral modification strategies: increasing lean protein intake, decreasing simple carbohydrates, increasing vegetables, increasing water intake, decreasing eating out, no skipping meals, meal planning and cooking strategies, keeping healthy foods in the home and planning for success.  Vanessa Christensen has agreed to follow-up with our clinic in 3 weeks. She was informed of the importance of frequent follow-up visits to maximize her success with intensive lifestyle modifications for her multiple health conditions.   Objective:   Blood pressure 125/82, pulse 79, temperature 98 F (36.7 C), height 5\' 2"  (1.575 m), weight 189 lb (85.7 kg), SpO2 96 %. Body mass index is 34.57 kg/m.  General: Cooperative, alert, well developed, in no acute distress. HEENT: Conjunctivae and lids unremarkable. Cardiovascular: Regular rhythm.  Lungs: Normal work of breathing. Neurologic: No focal deficits.   Lab Results  Component Value Date   CREATININE 0.79 07/30/2020   BUN 15 07/30/2020   NA 141 07/30/2020   K 4.2 07/30/2020   CL 108 07/30/2020   CO2 26 07/30/2020   Lab Results  Component Value Date   ALT 19 07/30/2020   AST 19 07/30/2020   ALKPHOS 66 07/30/2020   BILITOT 0.4 07/30/2020   Lab Results  Component Value Date   HGBA1C 5.6 01/15/2020   HGBA1C 5.4 11/04/2018   HGBA1C 5.6 08/13/2018  Lab Results  Component Value Date   INSULIN 8.0 01/15/2020   INSULIN 10.9 11/04/2018   INSULIN 13.2 08/13/2018   Lab Results  Component Value Date   TSH 1.670 01/15/2020   Lab Results  Component Value Date   CHOL 188 01/15/2020   HDL 52 01/15/2020   LDLCALC 124 (H) 01/15/2020   TRIG 63 01/15/2020    CHOLHDL 4 05/08/2018   Lab Results  Component Value Date   WBC 3.0 (L) 07/30/2020   HGB 11.6 (L) 07/30/2020   HCT 37.1 07/30/2020   MCV 87.9 07/30/2020   PLT 180 07/30/2020   No results found for: IRON, TIBC, FERRITIN  Obesity Behavioral Intervention:   Approximately 15 minutes were spent on the discussion below.  ASK: We discussed the diagnosis of obesity with Vanessa Christensen today and Vanessa Christensen agreed to give [name] permission to discuss obesity behavioral modification therapy today.  ASSESS: Vanessa Christensen has the diagnosis of obesity and her BMI today is 34.7. Vanessa Christensen is in the action stage of change.   ADVISE: Vanessa Christensen was educated on the multiple health risks of obesity as well as the benefit of weight loss to improve her health. She was advised of the need for long term treatment and the importance of lifestyle modifications to improve her current health and to decrease her risk of future health problems.  AGREE: Multiple dietary modification options and treatment options were discussed and Vanessa Christensen agreed to follow the recommendations documented in the above note.  ARRANGE: Vanessa Christensen was educated on the importance of frequent visits to treat obesity as outlined per CMS and USPSTF guidelines and agreed to schedule her next follow up appointment today.  Attestation Statements:   Reviewed by clinician on day of visit: allergies, medications, problem list, medical history, surgical history, family history, social history, and previous encounter notes.   Coral Ceo, am acting as Location manager for [employer], DO.  I have reviewed the above documentation for accuracy and completeness, and I agree with the above. Jearld Lesch, DO

## 2020-10-06 ENCOUNTER — Encounter (INDEPENDENT_AMBULATORY_CARE_PROVIDER_SITE_OTHER): Payer: Self-pay | Admitting: Bariatrics

## 2020-10-21 ENCOUNTER — Ambulatory Visit (INDEPENDENT_AMBULATORY_CARE_PROVIDER_SITE_OTHER): Payer: Medicare Other | Admitting: Bariatrics

## 2020-10-21 ENCOUNTER — Other Ambulatory Visit: Payer: Self-pay

## 2020-10-21 ENCOUNTER — Encounter (INDEPENDENT_AMBULATORY_CARE_PROVIDER_SITE_OTHER): Payer: Self-pay | Admitting: Bariatrics

## 2020-10-21 VITALS — BP 130/79 | Ht 62.0 in | Wt 192.0 lb

## 2020-10-21 DIAGNOSIS — G4733 Obstructive sleep apnea (adult) (pediatric): Secondary | ICD-10-CM | POA: Diagnosis not present

## 2020-10-21 DIAGNOSIS — N393 Stress incontinence (female) (male): Secondary | ICD-10-CM

## 2020-10-21 DIAGNOSIS — E559 Vitamin D deficiency, unspecified: Secondary | ICD-10-CM | POA: Diagnosis not present

## 2020-10-21 DIAGNOSIS — E038 Other specified hypothyroidism: Secondary | ICD-10-CM

## 2020-10-21 DIAGNOSIS — Z6841 Body Mass Index (BMI) 40.0 and over, adult: Secondary | ICD-10-CM

## 2020-10-21 MED ORDER — VITAMIN D (ERGOCALCIFEROL) 1.25 MG (50000 UNIT) PO CAPS: 50000.0000 [IU] | ORAL_CAPSULE | ORAL | 0 refills | Status: DC

## 2020-10-21 MED ORDER — SOLIFENACIN SUCCINATE 5 MG PO TABS: 5.0000 mg | ORAL_TABLET | Freq: Every day | ORAL | 1 refills | Status: DC

## 2020-10-26 ENCOUNTER — Encounter (INDEPENDENT_AMBULATORY_CARE_PROVIDER_SITE_OTHER): Payer: Self-pay | Admitting: Bariatrics

## 2020-10-26 NOTE — Progress Notes (Signed)
Chief Complaint:   OBESITY Vanessa Christensen is here to discuss her progress with her obesity treatment plan along with follow-up of her obesity related diagnoses. Vanessa Christensen is on the Category 3 Plan and states she is following her eating plan approximately 40% of the time. Vanessa Christensen states she is doing cardio and strength training for 60 minutes 2 times per week.  Today's visit was #: 81 Starting weight: 241 lbs Starting date: 08/13/2018 Today's weight: 192 lbs Today's date: 10/21/2020 Total lbs lost to date: 49 lbs Total lbs lost since last in-office visit: 0  Interim History: Vanessa Christensen is up 3 pounds since her last visit.  She has been at her daughter's for 1 week.  Subjective:   1. OSA (obstructive sleep apnea) Vanessa Christensen is not using a CPAP.  She is reasonably well rested.  2. Vitamin D deficiency Vanessa Christensen's Vitamin D level was 36.9 on 01/15/2020. She is currently taking prescription vitamin D 50,000 IU each week. She denies nausea, vomiting or muscle weakness.  3. Stress incontinence She had been taking tolterodine tartrate ER.  Assessment/Plan:   1. OSA (obstructive sleep apnea) Intensive lifestyle modifications are the first line treatment for this issue. We discussed several lifestyle modifications today and she will continue to work on diet, exercise and weight loss efforts. We will continue to monitor.  Does not need a CPAP at this time, per patient.  2. Vitamin D deficiency Low Vitamin D level contributes to fatigue and are associated with obesity, breast, and colon cancer. She agrees to continue to take prescription Vitamin D @50 ,000 IU every week and will follow-up for routine testing of Vitamin D, at least 2-3 times per year to avoid over-replacement.  - Refill Vitamin D, Ergocalciferol, (DRISDOL) 1.25 MG (50000 UNIT) CAPS capsule; Take 1 capsule (50,000 Units total) by mouth every 14 (fourteen) days.  Dispense: 2 capsule; Refill: 0  3. Stress incontinence Start Vesicare 5 mg daily.   Will consider seeing a pelvic care specialist for subsequent therapy. .  - Start solifenacin (VESICARE) 5 MG tablet; Take 1 tablet (5 mg total) by mouth daily.  Dispense: 30 tablet; Refill: 1  4. Obesity, current BMI 35  Vanessa Christensen is currently in the action stage of change. As such, her goal is to continue with weight loss efforts. She has agreed to the Category 3 Plan.   She will work on meal planning, decreasing carbohydrates, will adhere more closely to the plan, and will increase her water intake.  Exercise goals: Will continue to exercise.  Behavioral modification strategies: increasing lean protein intake, decreasing simple carbohydrates, increasing vegetables, increasing water intake, decreasing liquid calories, decreasing sodium intake, increasing high fiber foods, decreasing eating out, no skipping meals, meal planning and cooking strategies, keeping healthy foods in the home and planning for success.  Vanessa Christensen has agreed to follow-up with our clinic in 2-3 weeks. She was informed of the importance of frequent follow-up visits to maximize her success with intensive lifestyle modifications for her multiple health conditions.   Objective:   Blood pressure 130/79, height 5\' 2"  (1.575 m), weight 192 lb (87.1 kg). Body mass index is 35.12 kg/m.  General: Cooperative, alert, well developed, in no acute distress. HEENT: Conjunctivae and lids unremarkable. Cardiovascular: Regular rhythm.  Lungs: Normal work of breathing. Neurologic: No focal deficits.   Lab Results  Component Value Date   CREATININE 0.79 07/30/2020   BUN 15 07/30/2020   NA 141 07/30/2020   K 4.2 07/30/2020   CL 108 07/30/2020  CO2 26 07/30/2020   Lab Results  Component Value Date   ALT 19 07/30/2020   AST 19 07/30/2020   ALKPHOS 66 07/30/2020   BILITOT 0.4 07/30/2020   Lab Results  Component Value Date   HGBA1C 5.6 01/15/2020   HGBA1C 5.4 11/04/2018   HGBA1C 5.6 08/13/2018   Lab Results  Component  Value Date   INSULIN 8.0 01/15/2020   INSULIN 10.9 11/04/2018   INSULIN 13.2 08/13/2018   Lab Results  Component Value Date   TSH 1.670 01/15/2020   Lab Results  Component Value Date   CHOL 188 01/15/2020   HDL 52 01/15/2020   LDLCALC 124 (H) 01/15/2020   TRIG 63 01/15/2020   CHOLHDL 4 05/08/2018   Lab Results  Component Value Date   WBC 3.0 (L) 07/30/2020   HGB 11.6 (L) 07/30/2020   HCT 37.1 07/30/2020   MCV 87.9 07/30/2020   PLT 180 07/30/2020   Obesity Behavioral Intervention:   Approximately 15 minutes were spent on the discussion below.  ASK: We discussed the diagnosis of obesity with Vanessa Christensen today and Vanessa Christensen agreed to give [name] permission to discuss obesity behavioral modification therapy today.  ASSESS: Vanessa Christensen has the diagnosis of obesity and her BMI today is 35.1. Vanessa Christensen is in the action stage of change.   ADVISE: Vanessa Christensen was educated on the multiple health risks of obesity as well as the benefit of weight loss to improve her health. She was advised of the need for long term treatment and the importance of lifestyle modifications to improve her current health and to decrease her risk of future health problems.  AGREE: Multiple dietary modification options and treatment options were discussed and Vanessa Christensen agreed to follow the recommendations documented in the above note.  ARRANGE: Vanessa Christensen was educated on the importance of frequent visits to treat obesity as outlined per CMS and USPSTF guidelines and agreed to schedule her next follow up appointment today.  Attestation Statements:   Reviewed by clinician on day of visit: allergies, medications, problem list, medical history, surgical history, family history, social history, and previous encounter notes.  I, Water quality scientist, CMA, am acting as Location manager for [employer], DO  I have reviewed the above documentation for accuracy and completeness, and I agree with the above. Vanessa Lesch, DO

## 2020-11-01 ENCOUNTER — Other Ambulatory Visit (INDEPENDENT_AMBULATORY_CARE_PROVIDER_SITE_OTHER): Payer: Self-pay | Admitting: Bariatrics

## 2020-11-01 DIAGNOSIS — E559 Vitamin D deficiency, unspecified: Secondary | ICD-10-CM

## 2020-11-03 ENCOUNTER — Ambulatory Visit (INDEPENDENT_AMBULATORY_CARE_PROVIDER_SITE_OTHER): Payer: Medicare Other | Admitting: Orthopaedic Surgery

## 2020-11-03 ENCOUNTER — Ambulatory Visit (INDEPENDENT_AMBULATORY_CARE_PROVIDER_SITE_OTHER): Payer: Medicare Other

## 2020-11-03 ENCOUNTER — Ambulatory Visit: Payer: Self-pay

## 2020-11-03 ENCOUNTER — Other Ambulatory Visit: Payer: Self-pay

## 2020-11-03 DIAGNOSIS — M25562 Pain in left knee: Secondary | ICD-10-CM

## 2020-11-03 DIAGNOSIS — Z96651 Presence of right artificial knee joint: Secondary | ICD-10-CM | POA: Diagnosis not present

## 2020-11-03 DIAGNOSIS — M25561 Pain in right knee: Secondary | ICD-10-CM | POA: Diagnosis not present

## 2020-11-03 MED ORDER — GABAPENTIN 100 MG PO CAPS: 100.0000 mg | ORAL_CAPSULE | Freq: Every day | ORAL | 1 refills | Status: DC

## 2020-11-03 NOTE — Progress Notes (Signed)
Office Visit Note   Patient: Vanessa Christensen           Date of Birth: March 18, 1951           MRN: [id number] Visit Date: 11/03/2020              Requested by: [name], MD [name] [place],  [place] [address] PCP: [name], MD   Assessment & Plan: Visit Diagnoses:  1. Acute pain of left knee   2. History of total right knee replacement     Plan: I gave her reassurance that both knees are doing well in terms of no fracture.  At some point she may consider a left total knee arthroplasty if the pain gets bad enough.  We will get a try some Neurontin for her peripheral neuropathy starting off with a low-dose of 100 mg at night and increasing it to twice daily and even up to 3 times daily if she is tolerating it well after a week to 2 weeks.  I will see her back in 6 weeks to see how she is doing overall especially for neuropathy. Follow-Up Instructions: Return in about 6 months (around 05/05/2021).   Orders:  Orders Placed This Encounter  Procedures  . XR KNEE 3 VIEW LEFT  . XR KNEE 3 VIEW RIGHT   Meds ordered this encounter  Medications  . gabapentin (NEURONTIN) 100 MG capsule    Sig: Take 1 capsule (100 mg total) by mouth at bedtime. Can increase to twice daily if tolerating after 7 days.  Can even increase to three times daily.    Dispense:  60 capsule    Refill:  1      Procedures: No procedures performed   Clinical Data: No additional findings.   Subjective: Chief Complaint  Patient presents with  . Left Knee - Pain  . Right Knee - Pain  The patient is a 70 year old that we performed a revision arthroplasty of an aseptic loosening right total knee about 5 to 6 months ago.  [place]  She has a long stemmed tibial prosthesis and a thicker polyliner.  She does have well-documented arthritis of the left knee.  About 3 weeks ago she was in the gym and tripped and fell onto both her knees.  She had a right knee does  feel a little more swollen and there is some grinding she feels like maybe in both knees and both knees are sore.  She does have a complaint of peripheral neuropathy mainly involving the left foot.  She is now diabetic.  She does take a multivitamin.  She is able to walk without assist device but does walk slowly.  HPI  Review of Systems There is currently no headache, chest pain, shortness of breath, fever, chills, nausea, vomiting  Objective: Vital Signs: There were no vitals taken for this visit.  Physical Exam She is alert and orient x3 and in no acute distress Ortho Exam Examination of both knees show that there is no effusion.  Both knees have some pain over the patella is but the extensor mechanisms are intact with good range of motion.  Both knees are ligamentously stable.  The left knee has varus malalignment and significant grinding of the patellofemoral joint. Specialty Comments:  No specialty comments available.  Imaging: XR KNEE 3 VIEW LEFT  Result Date: 11/03/2020 3 views the left knee show no fracture or acute findings.  There is tricompartment arthritis with varus malalignment, significant medial joint space narrowing and patellofemoral narrowing.  There are osteophytes in all 3 compartments.  XR KNEE 3 VIEW RIGHT  Result Date: 11/03/2020 3 views of the right knee show well-seated revision arthroplasty with no complicating features.  There is no acute findings in light of the patient's recent fall.    PMFS History: Patient Active Problem List   Diagnosis Date Noted  . Status post revision of total replacement of right knee 05/07/2020  . Failed total knee, right, 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/2020  . History of total right knee replacement 01/28/2020  . Vitamin D deficiency 08/29/2018  . 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 Class 3 severe obesity with serious comorbidity and body mass index (BMI) of 40.0 to 44.9 in adult ([facility]) 08/14/2018  . Other specified glaucoma  08/14/2018  . Incontinence of urine in female 11/07/2017  . Vertigo 11/07/2017  . Left knee pain 10/25/2016  . Genetic testing 08/08/2016  . Hypothyroidism 12/02/2015  . Anxiety and depression 12/02/2015  . OAB (overactive bladder) 12/02/2015  . Breast cancer of upper-outer quadrant of left female breast ([facility]) 12/02/2015  . Obesity (BMI 30-39.9) 12/02/2015   Past Medical History:  Diagnosis Date  . Anemia    hx of  . Anxiety    pt denies  . Arthritis    hands and feet  . Cancer ([facility]) 10/04/2006   breast  . Cataracts, both eyes   . Depression   . Depression   . Diverticulosis of colon   . Glaucoma   . Heart murmur    No work up done 5 years ago  . Joint pain   . Knee pain   . Obesity   . OSA on CPAP   . Osteoarthritis   . Sleep apnea    no CPAP- no longer needed d/t weight loss  . Thyroid activity decreased     Family History  Problem Relation Age of Onset  . CVA Mother   . Cancer Mother 54       breast cancer   . Heart disease Mother   . Stroke Mother   . Obesity Mother   . Leukemia Father   . High blood pressure Father   . High Cholesterol Father   . Heart disease Father   . Cancer Maternal Aunt 55       breast cancer   . Cancer Maternal Aunt 55       breast cancer  . Colon cancer Maternal Aunt   . Cancer Cousin 32       breast cancer  . Esophageal cancer Neg Hx   . Rectal cancer Neg Hx   . Stomach cancer Neg Hx     Past Surgical History:  Procedure Laterality Date  . BREAST SURGERY Bilateral   . CATARACT EXTRACTION    . COLONOSCOPY    . GLAUCOMA REPAIR    . MASTECTOMY, RADICAL Bilateral   . neulasta induced sterile abscesses    . THYROIDECTOMY, PARTIAL    . TONSILLECTOMY    . TOTAL KNEE ARTHROPLASTY Right   . TOTAL KNEE REVISION Right 05/07/2020   Procedure: RIGHT TOTAL KNEE REVISION ARTHROPLASTY;  Surgeon: Mcarthur Rossetti, MD;  Location: [facility];  Service: Orthopedics;  Laterality: Right;   Social History   Occupational History  .  Occupation: Retired    Comment: Pharmacist, hospital  Tobacco Use  . Smoking status: Former Smoker    Quit date: 11/03/1978    Years since  quitting: 42.0  . Smokeless tobacco: Never Used  Vaping Use  . Vaping Use: Never used  Substance and Sexual Activity  . Alcohol use: No  . Drug use: No  . Sexual activity: Not Currently    Birth control/protection: Post-menopausal

## 2020-11-14 ENCOUNTER — Other Ambulatory Visit (INDEPENDENT_AMBULATORY_CARE_PROVIDER_SITE_OTHER): Payer: Self-pay | Admitting: Bariatrics

## 2020-11-14 DIAGNOSIS — N393 Stress incontinence (female) (male): Secondary | ICD-10-CM

## 2020-11-15 ENCOUNTER — Other Ambulatory Visit: Payer: Self-pay

## 2020-11-15 ENCOUNTER — Encounter (INDEPENDENT_AMBULATORY_CARE_PROVIDER_SITE_OTHER): Payer: Self-pay | Admitting: Bariatrics

## 2020-11-15 ENCOUNTER — Ambulatory Visit (INDEPENDENT_AMBULATORY_CARE_PROVIDER_SITE_OTHER): Payer: Medicare Other | Admitting: Bariatrics

## 2020-11-15 VITALS — BP 143/78 | HR 72 | Temp 98.2°F | Ht 62.0 in | Wt 190.0 lb

## 2020-11-15 DIAGNOSIS — E559 Vitamin D deficiency, unspecified: Secondary | ICD-10-CM | POA: Diagnosis not present

## 2020-11-15 DIAGNOSIS — Z6841 Body Mass Index (BMI) 40.0 and over, adult: Secondary | ICD-10-CM | POA: Diagnosis not present

## 2020-11-15 DIAGNOSIS — N393 Stress incontinence (female) (male): Secondary | ICD-10-CM | POA: Diagnosis not present

## 2020-11-15 MED ORDER — VITAMIN D (ERGOCALCIFEROL) 1.25 MG (50000 UNIT) PO CAPS: 50000.0000 [IU] | ORAL_CAPSULE | ORAL | 0 refills | Status: DC

## 2020-11-15 MED ORDER — SOLIFENACIN SUCCINATE 5 MG PO TABS: 5.0000 mg | ORAL_TABLET | Freq: Every day | ORAL | 0 refills | Status: DC

## 2020-11-15 NOTE — Telephone Encounter (Signed)
Dr.Brown 

## 2020-11-18 NOTE — Progress Notes (Signed)
Chief Complaint:   OBESITY Vanessa Christensen is here to discuss her progress with her obesity treatment plan along with follow-up of her obesity related diagnoses. Vanessa Christensen is on the Category 3 Plan and states she is following her eating plan approximately 40% of the time. Vanessa Christensen states she is not currently exercising.  Today's visit was #: 54 Starting weight: 241 lbs Starting date: 08/13/2018 Today's weight: 190 lbs Today's date: 11/14/2020 Total lbs lost to date: 51 Total lbs lost since last in-office visit: 2  Interim History: Vanessa Christensen is down an additional 2 pounds and has done well overall. Vanessa Christensen will  "buckle down" when she returns for her [place] trip, as I will be hard to follow during that time.  Subjective:   1. Vitamin D deficiency .Vanessa Christensen's Vitamin D level was 36.9 on 01/15/2020. She is currently taking prescription vitamin D 50,000 IU each week. She denies nausea, vomiting or muscle weakness.   2. Stress incontinence Vesicare is helping.  Assessment/Plan:   1. Vitamin D deficiency Low Vitamin D level contributes to fatigue and are associated with obesity, breast, and colon cancer. She agrees to continue to take prescription Vitamin D @50 ,000 IU every 2 weeks and will follow-up for routine testing of Vitamin D, at least 2-3 times per year to avoid over-replacement.   - Vitamin D, Ergocalciferol, (DRISDOL) 1.25 MG (50000 UNIT) CAPS capsule; Take 1 capsule (50,000 Units total) by mouth every 14 (fourteen) days.  Dispense: 2 capsule; Refill: 0  2. Stress incontinence Will continue - solifenacin (VESICARE) 5 MG tablet; Take 1 tablet (5 mg total) by mouth daily.  Dispense: 90 tablet; Refill: 0  3. Obesity, current BMI 34 Vanessa Christensen is currently in the action stage of change. As such, her goal is to continue with weight loss efforts. She has agreed to the Category 3 Plan.  Meal planning Mindful eating Visualizing your foods  Exercise goals: As is.  Behavioral modification  strategies: increasing lean protein intake, decreasing simple carbohydrates, increasing vegetables, increasing water intake, decreasing eating out, no skipping meals, meal planning and cooking strategies, keeping healthy foods in the home, and planning for success.  Vanessa Christensen has agreed to follow-up with our clinic in 4 weeks. She was informed of the importance of frequent follow-up visits to maximize her success with intensive lifestyle modifications for her multiple health conditions.   Objective:   Blood pressure (!) 143/78, pulse 72, temperature 98.2 F (36.8 C), height 5\' 2"  (1.575 m), weight 190 lb (86.2 kg), SpO2 98 %. Body mass index is 34.75 kg/m.  General: Cooperative, alert, well developed, in no acute distress. HEENT: Conjunctivae and lids unremarkable. Cardiovascular: Regular rhythm.  Lungs: Normal work of breathing. Neurologic: No focal deficits.   Lab Results  Component Value Date   CREATININE 0.79 07/30/2020   BUN 15 07/30/2020   NA 141 07/30/2020   K 4.2 07/30/2020   CL 108 07/30/2020   CO2 26 07/30/2020   Lab Results  Component Value Date   ALT 19 07/30/2020   AST 19 07/30/2020   ALKPHOS 66 07/30/2020   BILITOT 0.4 07/30/2020   Lab Results  Component Value Date   HGBA1C 5.6 01/15/2020   HGBA1C 5.4 11/04/2018   HGBA1C 5.6 08/13/2018   Lab Results  Component Value Date   INSULIN 8.0 01/15/2020   INSULIN 10.9 11/04/2018   INSULIN 13.2 08/13/2018   Lab Results  Component Value Date   TSH 1.670 01/15/2020   Lab Results  Component Value Date  CHOL 188 01/15/2020   HDL 52 01/15/2020   LDLCALC 124 (H) 01/15/2020   TRIG 63 01/15/2020   CHOLHDL 4 05/08/2018   Lab Results  Component Value Date   WBC 3.0 (L) 07/30/2020   HGB 11.6 (L) 07/30/2020   HCT 37.1 07/30/2020   MCV 87.9 07/30/2020   PLT 180 07/30/2020   No results found for: IRON, TIBC, FERRITIN  Obesity Behavioral Intervention:   Approximately 15 minutes were spent on the discussion  below.  ASK: We discussed the diagnosis of obesity with Vanessa Christensen today and Vanessa Christensen agreed to give [name] permission to discuss obesity behavioral modification therapy today.  ASSESS: Vanessa Christensen has the diagnosis of obesity and her BMI today is 34.8. Vanessa Christensen is in the action stage of change.   ADVISE: Vanessa Christensen was educated on the multiple health risks of obesity as well as the benefit of weight loss to improve her health. She was advised of the need for long term treatment and the importance of lifestyle modifications to improve her current health and to decrease her risk of future health problems.  AGREE: Multiple dietary modification options and treatment options were discussed and Vanessa Christensen agreed to follow the recommendations documented in the above note.  ARRANGE: Vanessa Christensen was educated on the importance of frequent visits to treat obesity as outlined per CMS and USPSTF guidelines and agreed to schedule her next follow up appointment today.  Attestation Statements:   Reviewed by clinician on day of visit: allergies, medications, problem list, medical history, surgical history, family history, social history, and previous encounter notes.  I, Lennette Bihari, CMA, am acting as transcriptionist for [employer]  I have reviewed the above documentation for accuracy and completeness, and I agree with the above. Jearld Lesch, DO

## 2020-11-25 ENCOUNTER — Encounter (INDEPENDENT_AMBULATORY_CARE_PROVIDER_SITE_OTHER): Payer: Self-pay | Admitting: Bariatrics

## 2020-11-28 ENCOUNTER — Other Ambulatory Visit (INDEPENDENT_AMBULATORY_CARE_PROVIDER_SITE_OTHER): Payer: Self-pay | Admitting: Bariatrics

## 2020-11-28 DIAGNOSIS — E559 Vitamin D deficiency, unspecified: Secondary | ICD-10-CM

## 2020-12-01 ENCOUNTER — Encounter: Payer: Self-pay | Admitting: *Deleted

## 2020-12-08 ENCOUNTER — Other Ambulatory Visit: Payer: Self-pay

## 2020-12-08 ENCOUNTER — Encounter (INDEPENDENT_AMBULATORY_CARE_PROVIDER_SITE_OTHER): Payer: Self-pay | Admitting: Bariatrics

## 2020-12-08 ENCOUNTER — Ambulatory Visit (INDEPENDENT_AMBULATORY_CARE_PROVIDER_SITE_OTHER): Payer: Medicare Other | Admitting: Bariatrics

## 2020-12-08 VITALS — BP 145/84 | HR 70 | Temp 98.3°F | Ht 62.0 in | Wt 189.0 lb

## 2020-12-08 DIAGNOSIS — E559 Vitamin D deficiency, unspecified: Secondary | ICD-10-CM | POA: Diagnosis not present

## 2020-12-08 DIAGNOSIS — Z6841 Body Mass Index (BMI) 40.0 and over, adult: Secondary | ICD-10-CM | POA: Diagnosis not present

## 2020-12-08 DIAGNOSIS — E038 Other specified hypothyroidism: Secondary | ICD-10-CM

## 2020-12-08 DIAGNOSIS — E8881 Metabolic syndrome: Secondary | ICD-10-CM

## 2020-12-08 DIAGNOSIS — R7309 Other abnormal glucose: Secondary | ICD-10-CM | POA: Diagnosis not present

## 2020-12-09 LAB — LIPID PANEL WITH LDL/HDL RATIO
Cholesterol, Total: 189 mg/dL (ref 100–199)
HDL: 47 mg/dL (ref >39)
LDL Chol Calc (NIH): 127 mg/dL — ABNORMAL HIGH (ref 0–99)
LDL/HDL Ratio: 2.7 ratio (ref 0.0–3.2)
Triglycerides: 80 mg/dL (ref 0–149)
VLDL Cholesterol Cal: 15 mg/dL (ref 5–40)

## 2020-12-09 LAB — COMPREHENSIVE METABOLIC PANEL
ALT: 20 IU/L (ref 0–32)
AST: 20 IU/L (ref 0–40)
Albumin/Globulin Ratio: 2.4 — ABNORMAL HIGH (ref 1.2–2.2)
Albumin: 4.8 g/dL — ABNORMAL HIGH (ref 3.7–4.7)
Alkaline Phosphatase: 76 IU/L (ref 44–121)
BUN/Creatinine Ratio: 33 — ABNORMAL HIGH (ref 12–28)
BUN: 21 mg/dL (ref 8–27)
Bilirubin Total: 0.4 mg/dL (ref 0.0–1.2)
CO2: 24 mmol/L (ref 20–29)
Calcium: 9.4 mg/dL (ref 8.7–10.3)
Chloride: 104 mmol/L (ref 96–106)
Creatinine, Ser: 0.63 mg/dL (ref 0.57–1.00)
Globulin, Total: 2 g/dL (ref 1.5–4.5)
Glucose: 79 mg/dL (ref 65–99)
Potassium: 4.7 mmol/L (ref 3.5–5.2)
Sodium: 141 mmol/L (ref 134–144)
Total Protein: 6.8 g/dL (ref 6.0–8.5)
eGFR: 94 mL/min/{1.73_m2} (ref >59)

## 2020-12-09 LAB — TSH+T4F+T3FREE
Free T4: 1.32 ng/dL (ref 0.82–1.77)
T3, Free: 2.3 pg/mL (ref 2.0–4.4)
TSH: 1.05 u[IU]/mL (ref 0.450–4.500)

## 2020-12-09 LAB — HEMOGLOBIN A1C
Est. average glucose Bld gHb Est-mCnc: 103 mg/dL
Hgb A1c MFr Bld: 5.2 % (ref 4.8–5.6)

## 2020-12-09 LAB — INSULIN, RANDOM: INSULIN: 4.7 u[IU]/mL (ref 2.6–24.9)

## 2020-12-09 LAB — VITAMIN D 25 HYDROXY (VIT D DEFICIENCY, FRACTURES): Vit D, 25-Hydroxy: 74.7 ng/mL (ref 30.0–100.0)

## 2020-12-13 NOTE — Progress Notes (Signed)
Chief Complaint:   OBESITY Vanessa Christensen is here to discuss her progress with her obesity treatment plan along with follow-up of her obesity related diagnoses. Vanessa Christensen is on the Category 3 Plan and states she is following her eating plan approximately 50-60% of the time. Vanessa Christensen states she is walking 8 miles for 6 days.  Today's visit was #: 38 Starting weight: 241 lbs Starting date: 08/13/2018 Today's weight: 189 lbs Today's date: 12/08/2020 Total lbs lost to date: 52 lbs Total lbs lost since last in-office visit: 1 lb  Interim History: Vanessa Christensen is down 1 lb and back from her trip. She had a good time. She is doing well with her water intake.  Subjective:   1. Insulin resistance Vanessa Christensen is not medications and she is due for labs.  2. Other specified hypothyroidism Vanessa Christensen is taking Synthroid.  3. Vitamin D deficiency Vanessa Christensen is taking Vit D prescription.  4. Elevated glucose Vanessa Christensen is due for labs.  Assessment/Plan:   1. Insulin resistance Vanessa Christensen will continue to work on weight loss, exercise, and decreasing simple carbohydrates to help decrease the risk of diabetes. We will check labs today. Vanessa Christensen agreed to follow-up with [name] as directed to closely monitor her progress.  - Insulin, random - Hemoglobin A1c  2. Other specified hypothyroidism We will check labs today. Vanessa Christensen will continue to follow up as directed. Orders and follow up as documented in patient record.  Counseling Good thyroid control is important for overall health. Supratherapeutic thyroid levels are dangerous and will not improve weight loss results. Counseling: The correct way to take levothyroxine is fasting, with water, separated by at least 30 minutes from breakfast, and separated by more than 4 hours from calcium, iron, multivitamins, acid reflux medications (PPIs).    - TSH+T4F+T3Free  3. Vitamin D deficiency Low Vitamin D level contributes to fatigue and are associated with obesity, breast, and colon  cancer. We will check labs today. Vanessa Christensen agreed to continue taking prescription Vitamin D 50,000 IU every 14 days and will follow-up for routine testing of Vitamin D, at least 2-3 times per year to avoid over-replacement.  - VITAMIN D 25 Hydroxy (Vit-D Deficiency, Fractures)  4. Elevated glucose We will check labs today, and Vanessa Christensen will continue to follow up as directed.  - Insulin, random - Hemoglobin A1c - Lipid Panel With LDL/HDL Ratio - Comprehensive metabolic panel  5. Obesity, current BMI 34.6 Vanessa Christensen is currently in the action stage of change. As such, her goal is to continue with weight loss efforts. She has agreed to the Category 3 Plan.   Vanessa Christensen will continue meal planning. She will continue intentional eating.  Exercise goals: As is, and she will get back into the gym 3 times per week.  Behavioral modification strategies: increasing lean protein intake, decreasing simple carbohydrates, increasing vegetables, increasing water intake, decreasing eating out, no skipping meals, meal planning and cooking strategies, keeping healthy foods in the home, and planning for success.  Vanessa Christensen has agreed to follow-up with our clinic in 2 weeks. She was informed of the importance of frequent follow-up visits to maximize her success with intensive lifestyle modifications for her multiple health conditions.   Vanessa Christensen was informed we would discuss her lab results at her next visit unless there is a critical issue that needs to be addressed sooner. Vanessa Christensen agreed to keep her next visit at the agreed upon time to discuss these results.  Objective:   Blood pressure (!) 145/84, pulse 70, temperature 98.3 F (36.8  C), height 5\' 2"  (1.575 m), weight 189 lb (85.7 kg), SpO2 98 %. Body mass index is 34.57 kg/m.  General: Cooperative, alert, well developed, in no acute distress. HEENT: Conjunctivae and lids unremarkable. Cardiovascular: Regular rhythm.  Lungs: Normal work of breathing. Neurologic: No  focal deficits.   Lab Results  Component Value Date   CREATININE 0.63 12/08/2020   BUN 21 12/08/2020   NA 141 12/08/2020   K 4.7 12/08/2020   CL 104 12/08/2020   CO2 24 12/08/2020   Lab Results  Component Value Date   ALT 20 12/08/2020   AST 20 12/08/2020   ALKPHOS 76 12/08/2020   BILITOT 0.4 12/08/2020   Lab Results  Component Value Date   HGBA1C 5.2 12/08/2020   HGBA1C 5.6 01/15/2020   HGBA1C 5.4 11/04/2018   HGBA1C 5.6 08/13/2018   Lab Results  Component Value Date   INSULIN 4.7 12/08/2020   INSULIN 8.0 01/15/2020   INSULIN 10.9 11/04/2018   INSULIN 13.2 08/13/2018   Lab Results  Component Value Date   TSH 1.050 12/08/2020   Lab Results  Component Value Date   CHOL 189 12/08/2020   HDL 47 12/08/2020   LDLCALC 127 (H) 12/08/2020   TRIG 80 12/08/2020   CHOLHDL 4 05/08/2018   Lab Results  Component Value Date   VD25OH 74.7 12/08/2020   VD25OH 36.9 01/15/2020   VD25OH 42.4 11/04/2018   Lab Results  Component Value Date   WBC 3.0 (L) 07/30/2020   HGB 11.6 (L) 07/30/2020   HCT 37.1 07/30/2020   MCV 87.9 07/30/2020   PLT 180 07/30/2020   No results found for: IRON, TIBC, FERRITIN  Obesity Behavioral Intervention:   Approximately 15 minutes were spent on the discussion below.  ASK: We discussed the diagnosis of obesity with Vanessa Christensen today and Vanessa Christensen agreed to give [name] permission to discuss obesity behavioral modification therapy today.  ASSESS: Vanessa Christensen has the diagnosis of obesity and her BMI today is 34.6. Vanessa Christensen is in the action stage of change.   ADVISE: Vanessa Christensen was educated on the multiple health risks of obesity as well as the benefit of weight loss to improve her health. She was advised of the need for long term treatment and the importance of lifestyle modifications to improve her current health and to decrease her risk of future health problems.  AGREE: Multiple dietary modification options and treatment options were discussed and Vanessa Christensen agreed  to follow the recommendations documented in the above note.  ARRANGE: Vanessa Christensen was educated on the importance of frequent visits to treat obesity as outlined per CMS and USPSTF guidelines and agreed to schedule her next follow up appointment today.  Attestation Statements:   Reviewed by clinician on day of visit: allergies, medications, problem list, medical history, surgical history, family history, social history, and previous encounter notes.  I, Lizbeth Bark, RMA, am acting as Location manager for [employer], DO.   I have reviewed the above documentation for accuracy and completeness, and I agree with the above. Jearld Lesch, DO

## 2020-12-15 ENCOUNTER — Encounter (INDEPENDENT_AMBULATORY_CARE_PROVIDER_SITE_OTHER): Payer: Self-pay | Admitting: Bariatrics

## 2020-12-15 ENCOUNTER — Ambulatory Visit: Payer: Medicare Other | Admitting: Orthopaedic Surgery

## 2020-12-24 ENCOUNTER — Ambulatory Visit: Payer: Medicare Other | Admitting: Family Medicine

## 2020-12-27 ENCOUNTER — Ambulatory Visit (INDEPENDENT_AMBULATORY_CARE_PROVIDER_SITE_OTHER): Payer: Medicare Other | Admitting: Orthopaedic Surgery

## 2020-12-27 ENCOUNTER — Encounter: Payer: Self-pay | Admitting: Orthopaedic Surgery

## 2020-12-27 ENCOUNTER — Other Ambulatory Visit: Payer: Self-pay

## 2020-12-27 DIAGNOSIS — M1712 Unilateral primary osteoarthritis, left knee: Secondary | ICD-10-CM | POA: Insufficient documentation

## 2020-12-27 DIAGNOSIS — G8929 Other chronic pain: Secondary | ICD-10-CM | POA: Diagnosis not present

## 2020-12-27 DIAGNOSIS — M25562 Pain in left knee: Secondary | ICD-10-CM | POA: Diagnosis not present

## 2020-12-27 NOTE — Progress Notes (Signed)
Office Visit Note   Patient: Vanessa Christensen           Date of Birth: 28-Jun-1950           MRN: [id number] Visit Date: 12/27/2020              Requested by: [name], MD [address] [name] [address] [place],  [place] [address] PCP: [name], MD   Assessment & Plan: Visit Diagnoses:  1. Chronic pain of left knee   2. Unilateral primary osteoarthritis, left knee     Plan: We did talk in length in detail about knee replacement surgery for her left knee.  At this point I do feel that this is reasonable given the severity of her arthritis combined with the pain and her clinical exam findings for her left knee.  She would like this to be done in January 2023.  We can certainly try to accommodate this easily with her schedule.  All questions and concerns were answered and addressed.  Follow-Up Instructions: Return for 2 weeks post-op.   Orders:  No orders of the defined types were placed in this encounter.  No orders of the defined types were placed in this encounter.     Procedures: No procedures performed   Clinical Data: No additional findings.   Subjective: No chief complaint on file. The patient comes in today with a chief complaint of chronic left knee pain.  This is been getting worse for well over 12 months.  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  In December of this past year she underwent revision arthroplasty of the tibial component.  Her I performed a revision.  The original surgery for total knee was done elsewhere.  She has well-documented end-stage arthritis of her left knee.  We have already previously x-rayed this left knee.  At this point her pain is daily with the left knee and is detrimentally affecting her mobility, her quality of life and actives daily living.  She has tried and failed all forms of conservative treatment including weight loss, activity modification,  anti-inflammatories and injections.  She is interested in a left total knee arthroplasty but not until January 2023.  She has had no active or acute change in her medical status.  She is a very active 70 year old female and exercises regularly.  She is not a diabetic.  She does have neuropathy.  She is on gabapentin for this.  HPI  Review of Systems There is currently listed no headache, chest pain, shortness of breath, fever, chills, nausea, vomiting  Objective: Vital Signs: There were no vitals taken for this visit.  Physical Exam She is alert and oriented x3 and in no acute distress Ortho Exam Examination of her right knee shows a well-healed surgical incision with good range of motion.  The knee is stable.  There is some crepitation to be expected.  Her left painful knee has a varus malalignment that is correctable.  There is significant medial joint line tenderness with good range of motion of the knee.  There is significant patellofemoral crepitation. Specialty Comments:  No specialty comments available.  Imaging: No results found. Previous x-rays of the left knee are reviewed again with the patient.  There is tricompartment arthritis with varus malalignment, significant medial joint space and patellofemoral narrowing and osteophytes in all 3 compartments.  PMFS History: Patient Active Problem List   Diagnosis Date  Noted   Unilateral primary osteoarthritis, left knee 12/27/2020   Status post revision of total replacement of right knee 05/07/2020   Failed total knee, right, 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/2020   History of total right knee replacement 01/28/2020   Vitamin D deficiency 08/29/2018   Insulin resistance 08/29/2018   Class 3 severe obesity with serious comorbidity and body mass index (BMI) of 40.0 to 44.9 in adult ([place]) 08/14/2018   Other specified glaucoma 08/14/2018   Incontinence of urine in female 11/07/2017   Vertigo 11/07/2017   Left knee pain 10/25/2016   Genetic  testing 08/08/2016   Hypothyroidism 12/02/2015   Anxiety and depression 12/02/2015   OAB (overactive bladder) 12/02/2015   Breast cancer of upper-outer quadrant of left female breast ([place]) 12/02/2015   Obesity (BMI 30-39.9) 12/02/2015   Past Medical History:  Diagnosis Date   Anemia    hx of   Anxiety    pt denies   Arthritis    hands and feet   Cancer ([place]) 10/04/2006   breast   Cataracts, both eyes    Depression    Depression    Diverticulosis of colon    Glaucoma    Heart murmur    No work up done 5 years ago   Joint pain    Knee pain    Obesity    OSA on CPAP    Osteoarthritis    Sleep apnea    no CPAP- no longer needed d/t weight loss   Thyroid activity decreased     Family History  Problem Relation Age of Onset   CVA Mother    Cancer Mother 55       breast cancer    Heart disease Mother    Stroke Mother    Obesity Mother    Leukemia Father    High blood pressure Father    High Cholesterol Father    Heart disease Father    Cancer Maternal Aunt 25       breast cancer    Cancer Maternal Aunt 55       breast cancer   Colon cancer Maternal Aunt    Cancer Cousin 32       breast cancer   Esophageal cancer Neg Hx    Rectal cancer Neg Hx    Stomach cancer Neg Hx     Past Surgical History:  Procedure Laterality Date   BREAST SURGERY Bilateral    CATARACT EXTRACTION     COLONOSCOPY     GLAUCOMA REPAIR     MASTECTOMY, RADICAL Bilateral    neulasta induced sterile abscesses     THYROIDECTOMY, PARTIAL     TONSILLECTOMY     TOTAL KNEE ARTHROPLASTY Right    TOTAL KNEE REVISION Right 05/07/2020   Procedure: RIGHT TOTAL KNEE REVISION ARTHROPLASTY;  Surgeon: Mcarthur Rossetti, MD;  Location: [facility];  Service: Orthopedics;  Laterality: Right;   Social History   Occupational History   Occupation: Retired    Comment: Pharmacist, hospital  Tobacco Use   Smoking status: Former    Types: Cigarettes    Quit date: 11/03/1978    Years since quitting: 42.1   Smokeless  tobacco: Never  Vaping Use   Vaping Use: Never used  Substance and Sexual Activity   Alcohol use: No   Drug use: No   Sexual activity: Not Currently    Birth control/protection: Post-menopausal

## 2020-12-31 ENCOUNTER — Ambulatory Visit: Payer: Medicare Other | Admitting: Family Medicine

## 2021-01-04 ENCOUNTER — Encounter (INDEPENDENT_AMBULATORY_CARE_PROVIDER_SITE_OTHER): Payer: Self-pay | Admitting: Bariatrics

## 2021-01-04 ENCOUNTER — Ambulatory Visit (INDEPENDENT_AMBULATORY_CARE_PROVIDER_SITE_OTHER): Payer: Medicare Other | Admitting: Bariatrics

## 2021-01-04 ENCOUNTER — Other Ambulatory Visit: Payer: Self-pay

## 2021-01-04 VITALS — BP 142/82 | HR 71 | Temp 98.2°F | Ht 62.0 in | Wt 191.0 lb

## 2021-01-04 DIAGNOSIS — N393 Stress incontinence (female) (male): Secondary | ICD-10-CM | POA: Diagnosis not present

## 2021-01-04 DIAGNOSIS — Z6841 Body Mass Index (BMI) 40.0 and over, adult: Secondary | ICD-10-CM

## 2021-01-04 DIAGNOSIS — E559 Vitamin D deficiency, unspecified: Secondary | ICD-10-CM | POA: Diagnosis not present

## 2021-01-04 MED ORDER — SOLIFENACIN SUCCINATE 5 MG PO TABS: 5.0000 mg | ORAL_TABLET | Freq: Every day | ORAL | 0 refills | Status: DC

## 2021-01-04 MED ORDER — VITAMIN D (ERGOCALCIFEROL) 1.25 MG (50000 UNIT) PO CAPS: 50000.0000 [IU] | ORAL_CAPSULE | ORAL | 0 refills | Status: DC

## 2021-01-05 NOTE — Progress Notes (Signed)
Chief Complaint:   OBESITY Laqunda is here to discuss her progress with her obesity treatment plan along with follow-up of her obesity related diagnoses. Dorene is on the Category 4 Plan and states she is following her eating plan approximately 50% of the time. Aleisa states she is doing cardio and strength for 60 minutes 4 times per week.  Today's visit was #: 19 Starting weight: 241 lbs Starting date: 08/13/2018 Today's weight: 191 lbs Today's date: 01/04/2021 Total lbs lost to date: 50 lbs Total lbs lost since last in-office visit: 0  Interim History: Dorella is up 2 lbs since her last visit. She is thinking about having left knee surgery in October 2022.  Subjective:   1. Stress incontinence Ilze is taking Vesicare 5 mg.  2. Vitamin D deficiency Isobella is taking Vitamin D. Her last Vitamin D level was 74.7.   Assessment/Plan:   1. Stress incontinence We will refill Vesicare 5 mg for 90 days with no refills.   - solifenacin (VESICARE) 5 MG tablet; Take 1 tablet (5 mg total) by mouth daily.  Dispense: 90 tablet; Refill: 0  2. Vitamin D deficiency Low Vitamin D level contributes to fatigue and are associated with obesity, breast, and colon cancer. We will refill  prescription Vitamin D 50,000 IU every week and [place] will follow-up for routine testing of Vitamin D, at least 2-3 times per year to avoid over-replacement.  - Vitamin D, Ergocalciferol, (DRISDOL) 1.25 MG (50000 UNIT) CAPS capsule; Take 1 capsule (50,000 Units total) by mouth every 14 (fourteen) days.  Dispense: 2 capsule; Refill: 0  3. Obesity, current BMI 35 Liliani is currently in the action stage of change. As such, her goal is to continue with weight loss efforts. She has agreed to the Category 4 Plan.   Gesenia will continue meal planning. I reviewed labs from 12/08/2020 with [name]. She will increase H2O to goal.  Exercise goals:  As is.  Behavioral modification strategies: increasing lean protein intake,  decreasing simple carbohydrates, increasing vegetables, increasing water intake, decreasing eating out, no skipping meals, meal planning and cooking strategies, keeping healthy foods in the home, and planning for success.  Zilah has agreed to follow-up with our clinic in 4 weeks. She was informed of the importance of frequent follow-up visits to maximize her success with intensive lifestyle modifications for her multiple health conditions.   Objective:   Blood pressure (!) 142/82, pulse 71, temperature 98.2 F (36.8 C), height '5\' 2"'$  (1.575 m), weight 191 lb (86.6 kg), SpO2 97 %. Body mass index is 34.93 kg/m.  General: Cooperative, alert, well developed, in no acute distress. HEENT: Conjunctivae and lids unremarkable. Cardiovascular: Regular rhythm.  Lungs: Normal work of breathing. Neurologic: No focal deficits.   Lab Results  Component Value Date   CREATININE 0.63 12/08/2020   BUN 21 12/08/2020   NA 141 12/08/2020   K 4.7 12/08/2020   CL 104 12/08/2020   CO2 24 12/08/2020   Lab Results  Component Value Date   ALT 20 12/08/2020   AST 20 12/08/2020   ALKPHOS 76 12/08/2020   BILITOT 0.4 12/08/2020   Lab Results  Component Value Date   HGBA1C 5.2 12/08/2020   HGBA1C 5.6 01/15/2020   HGBA1C 5.4 11/04/2018   HGBA1C 5.6 08/13/2018   Lab Results  Component Value Date   INSULIN 4.7 12/08/2020   INSULIN 8.0 01/15/2020   INSULIN 10.9 11/04/2018   INSULIN 13.2 08/13/2018   Lab Results  Component Value  Date   TSH 1.050 12/08/2020   Lab Results  Component Value Date   CHOL 189 12/08/2020   HDL 47 12/08/2020   LDLCALC 127 (H) 12/08/2020   TRIG 80 12/08/2020   CHOLHDL 4 05/08/2018   Lab Results  Component Value Date   VD25OH 74.7 12/08/2020   VD25OH 36.9 01/15/2020   VD25OH 42.4 11/04/2018   Lab Results  Component Value Date   WBC 3.0 (L) 07/30/2020   HGB 11.6 (L) 07/30/2020   HCT 37.1 07/30/2020   MCV 87.9 07/30/2020   PLT 180 07/30/2020   No results found  for: IRON, TIBC, FERRITIN  Obesity Behavioral Intervention:   Approximately 15 minutes were spent on the discussion below.  ASK: We discussed the diagnosis of obesity with [name] today and Naelle agreed to give [name] permission to discuss obesity behavioral modification therapy today.  ASSESS: Musiq has the diagnosis of obesity and her BMI today is 35.0. Briasha is in the action stage of change.   ADVISE: Elsye was educated on the multiple health risks of obesity as well as the benefit of weight loss to improve her health. She was advised of the need for long term treatment and the importance of lifestyle modifications to improve her current health and to decrease her risk of future health problems.  AGREE: Multiple dietary modification options and treatment options were discussed and [place] agreed to follow the recommendations documented in the above note.  ARRANGE: Shakiya was educated on the importance of frequent visits to treat obesity as outlined per CMS and USPSTF guidelines and agreed to schedule her next follow up appointment today.  Attestation Statements:   Reviewed by clinician on day of visit: allergies, medications, problem list, medical history, surgical history, family history, social history, and previous encounter notes.  I, Lizbeth Bark, RMA, am acting as Location manager for [employer], DO.   I have reviewed the above documentation for accuracy and completeness, and I agree with the above. Jearld Lesch, DO

## 2021-01-06 ENCOUNTER — Encounter (INDEPENDENT_AMBULATORY_CARE_PROVIDER_SITE_OTHER): Payer: Self-pay | Admitting: Bariatrics

## 2021-01-17 ENCOUNTER — Other Ambulatory Visit: Payer: Self-pay | Admitting: Family Medicine

## 2021-01-17 DIAGNOSIS — E038 Other specified hypothyroidism: Secondary | ICD-10-CM

## 2021-01-24 ENCOUNTER — Other Ambulatory Visit (INDEPENDENT_AMBULATORY_CARE_PROVIDER_SITE_OTHER): Payer: Self-pay | Admitting: Bariatrics

## 2021-01-24 ENCOUNTER — Telehealth: Payer: Self-pay

## 2021-01-24 DIAGNOSIS — E559 Vitamin D deficiency, unspecified: Secondary | ICD-10-CM

## 2021-01-24 NOTE — Progress Notes (Signed)
Chronic Care Management Pharmacy Assistant   Name: Vanessa Christensen  MRN: [id number] DOB: July 10, 1950   Reason for Encounter: Disease State - General Adherence Call    Recent office visits:  01/04/21 Jearld Lesch, DO - Family Medicine [facility]) - Obesity - No medication changes. Follow up in 4 weeks.  12/08/20 Jearld Lesch, DO - Family Medicine ([place]) - Insulin resistance - Labs were ordered. No medication changes. Follow up in 2 weeks.   11/15/20 Jearld Lesch, DO - Family Medicine - Acute pain of left knee - No medication changes. Follow up in 4 weeks.   10/21/20 Jearld Lesch, DO - Family Medicine (Bariat[facility]) - OSA - No medication changes. Follow yp in 2 weeks.  10/04/20 Jearld Lesch, DO - Family Medicine [facility]) - Hypothyroidism - No medication changes. Follow up in 3 weeks.   09/09/20 Jearld Lesch, DO - Family Medicine (Bariatric) - Vitamin D Deficiency -    Recent consult visits:  12/27/20 Jean Rosenthal, MD - Orthopedics [facility]) - Chronic pain of left knee - No medication changes. Follow up in 2 weeks.  11/03/20 Jean Rosenthal, [facility] [place] ordered. No medication changes. Follow up in 6 months.   09/15/20 Jean Rosenthal, [place] ordered. Follow up not indicated. No medications. Follow up in 4 weeks.   Hospital visits:  None in previous 6 months  Medications: Outpatient Encounter Medications as of 01/24/2021  Medication Sig Note   acetaminophen (TYLENOL) 500 MG tablet Take 1,000 mg by mouth every 6 (six) hours as needed (pain).    Apoaequorin (PREVAGEN PO) Take 1 tablet by mouth daily.     Ascorbic Acid (VITAMIN C PO) Take 1 tablet by mouth daily.    aspirin EC 81 MG tablet Take 81 mg by mouth daily. Swallow whole.    AZOPT 1 % ophthalmic suspension Place 1 drop into both eyes in the morning and at bedtime.     B Complex-C (SUPER B COMPLEX PO) Take 1 tablet by mouth daily after supper.    FLUoxetine (PROZAC) 40 MG capsule  Take 1 capsule (40 mg total) by mouth daily.    gabapentin (NEURONTIN) 100 MG capsule Take 1 capsule (100 mg total) by mouth at bedtime. Can increase to twice daily if tolerating after 7 days.  Can even increase to three times daily.    levothyroxine (SYNTHROID) 75 MCG tablet TAKE 1 TABLET BY MOUTH EVERY DAY    meclizine (ANTIVERT) 25 MG tablet Take 25 mg by mouth 3 (three) times daily as needed for dizziness. 04/27/2020: rarely   Omega-3 Fatty Acids (FISH OIL ADULT GUMMIES) 113.5 MG CHEW Chew 2 tablets by mouth daily at 2 PM.    psyllium (METAMUCIL) 58.6 % powder Take 1 packet by mouth daily.     solifenacin (VESICARE) 5 MG tablet Take 1 tablet (5 mg total) by mouth daily.    traZODone (DESYREL) 50 MG tablet TAKE 0.5-1 TABLETS BY MOUTH AT BEDTIME AS NEEDED FOR SLEEP.    Vitamin D, Ergocalciferol, (DRISDOL) 1.25 MG (50000 UNIT) CAPS capsule Take 1 capsule (50,000 Units total) by mouth every 14 (fourteen) days.    No facility-administered encounter medications on file as of 01/24/2021.    Have you had any problems recently with your health? Patient denied having any problems with her health recently. States she is actually being more active and exercising more.   Have you had any problems with your pharmacy? Patient denied having any problems with her current pharmacy.  What issues or side effects are you having with your medications? Patient denied any issues or side effects with her current medications.  What would you like me to pass along to Madelin Rear, CPP for them to help you with?  Patient did not have anything she would like to pass along to CPP at this time.  What can we do to take care of you better? Patient did not have any suggestions at this time. She states she is very happy with her current level of care and thanked me for the call.  Star Rating Drugs: No Star Rating Drugs noted.   Future Appointments  Date Time Provider [place]  02/01/2021 11:00 AM Jearld Lesch A, DO [facility] None  02/18/2021 11:30 AM [name], MD [id number]  07/28/2021  9:00 AM [facility] None  07/28/2021  9:30 AM Alla Feeling, NP [facility] None     Jobe Gibbon, [facility] Clinical Pharmacist Assistant  [phone]  Time Spent: 40 minutes

## 2021-02-01 ENCOUNTER — Ambulatory Visit (INDEPENDENT_AMBULATORY_CARE_PROVIDER_SITE_OTHER): Payer: Medicare Other | Admitting: Bariatrics

## 2021-02-01 ENCOUNTER — Other Ambulatory Visit: Payer: Self-pay

## 2021-02-01 ENCOUNTER — Encounter (INDEPENDENT_AMBULATORY_CARE_PROVIDER_SITE_OTHER): Payer: Self-pay | Admitting: Bariatrics

## 2021-02-01 VITALS — BP 131/63 | HR 85 | Temp 98.6°F | Ht 62.0 in | Wt 193.0 lb

## 2021-02-01 DIAGNOSIS — E8881 Metabolic syndrome: Secondary | ICD-10-CM

## 2021-02-01 DIAGNOSIS — Z6841 Body Mass Index (BMI) 40.0 and over, adult: Secondary | ICD-10-CM

## 2021-02-01 DIAGNOSIS — E559 Vitamin D deficiency, unspecified: Secondary | ICD-10-CM | POA: Diagnosis not present

## 2021-02-01 DIAGNOSIS — E039 Hypothyroidism, unspecified: Secondary | ICD-10-CM | POA: Diagnosis not present

## 2021-02-01 NOTE — Progress Notes (Signed)
Chief Complaint:   OBESITY Vanessa Christensen is here to discuss her progress with her obesity treatment plan along with follow-up of her obesity related diagnoses. Vanessa Christensen is on the Category 4 Plan and states she is following her eating plan approximately 50% of the time. Vanessa Christensen states she is going to the gym for 60 minutes 4-5 times per week.  Today's visit was #: 27 Starting weight: 241 lbs Starting date: 08/13/2018 Today's weight: 193 lbs Today's date: 02/01/2021 Total lbs lost to date: 48 lbs Total lbs lost since last in-office visit: 0  Interim History: Vanessa Christensen is up 2 lbs since her last visit. Her muscle mass is up almost 1 lb and her water is up over 1 lb per the bioimpedance scale.  Subjective:   1. Hypothyroidism, unspecified type Vanessa Christensen is currently taking Synthroid.  2. Insulin resistance Vanessa Christensen is currently not on medications for insulin resistance.  3. Vitamin D deficiency Vanessa Christensen is currently taking Vitamin D previously prescription.  Assessment/Plan:   1. Hypothyroidism, unspecified type Vanessa Christensen will continue medications. Orders and follow up as documented in patient record.  Counseling Good thyroid control is important for overall health. Supratherapeutic thyroid levels are dangerous and will not improve weight loss results. Counseling: The correct way to take levothyroxine is fasting, with water, separated by at least 30 minutes from breakfast, and separated by more than 4 hours from calcium, iron, multivitamins, acid reflux medications (PPIs).    2. Insulin resistance Vanessa Christensen will continue to work on weight loss, exercise, and decreasing simple carbohydrates to help decrease the risk of diabetes. Vanessa Christensen agreed to follow-up with [name] as directed to closely monitor her progress.   3. Vitamin D deficiency Low Vitamin D level contributes to fatigue and are associated with obesity, breast, and colon cancer. Vanessa Christensen agrees  to take OTC Vitamin D 1,000 IU daily and she will  follow-up for routine testing of Vitamin D, at least 2-3 times per year to avoid over-replacement.   4. Obesity, current BMI 35.3 Vanessa Christensen is currently in the action stage of change. As such, her goal is to continue with weight loss efforts. She has agreed to the Category 4 Plan.   Vanessa Christensen will continue meal planning. She will adhere closely to the plan.  Exercise goals:  Vanessa Christensen will do more resistance. About 4-5 times a week at 7:15 am.   Behavioral modification strategies: increasing lean protein intake, decreasing simple carbohydrates, increasing vegetables, increasing water intake, decreasing eating out, no skipping meals, meal planning and cooking strategies, keeping healthy foods in the home, and planning for success.  Vanessa Christensen has agreed to follow-up with our clinic in 4 weeks. She was informed of the importance of frequent follow-up visits to maximize her success with intensive lifestyle modifications for her multiple health conditions.   Objective:   Blood pressure 131/63, pulse 85, temperature 98.6 F (37 C), height '5\' 2"'$  (1.575 m), weight 193 lb (87.5 kg), SpO2 97 %. Body mass index is 35.3 kg/m.  General: Cooperative, alert, well developed, in no acute distress. HEENT: Conjunctivae and lids unremarkable. Cardiovascular: Regular rhythm.  Lungs: Normal work of breathing. Neurologic: No focal deficits.   Lab Results  Component Value Date   CREATININE 0.63 12/08/2020   BUN 21 12/08/2020   NA 141 12/08/2020   K 4.7 12/08/2020   CL 104 12/08/2020   CO2 24 12/08/2020   Lab Results  Component Value Date   ALT 20 12/08/2020   AST 20 12/08/2020   ALKPHOS 76 12/08/2020  BILITOT 0.4 12/08/2020   Lab Results  Component Value Date   HGBA1C 5.2 12/08/2020   HGBA1C 5.6 01/15/2020   HGBA1C 5.4 11/04/2018   HGBA1C 5.6 08/13/2018   Lab Results  Component Value Date   INSULIN 4.7 12/08/2020   INSULIN 8.0 01/15/2020   INSULIN 10.9 11/04/2018   INSULIN 13.2 08/13/2018   Lab  Results  Component Value Date   TSH 1.050 12/08/2020   Lab Results  Component Value Date   CHOL 189 12/08/2020   HDL 47 12/08/2020   LDLCALC 127 (H) 12/08/2020   TRIG 80 12/08/2020   CHOLHDL 4 05/08/2018   Lab Results  Component Value Date   VD25OH 74.7 12/08/2020   VD25OH 36.9 01/15/2020   VD25OH 42.4 11/04/2018   Lab Results  Component Value Date   WBC 3.0 (L) 07/30/2020   HGB 11.6 (L) 07/30/2020   HCT 37.1 07/30/2020   MCV 87.9 07/30/2020   PLT 180 07/30/2020   No results found for: IRON, TIBC, FERRITIN  Obesity Behavioral Intervention:   Approximately 15 minutes were spent on the discussion below.  ASK: We discussed the diagnosis of obesity with Vanessa Christensen today and Vanessa Christensen agreed to give [name] permission to discuss obesity behavioral modification therapy today.  ASSESS: Vanessa Christensen has the diagnosis of obesity and her BMI today is 35.3. Vanessa Christensen is in the action stage of change.   ADVISE: Vanessa Christensen was educated on the multiple health risks of obesity as well as the benefit of weight loss to improve her health. She was advised of the need for long term treatment and the importance of lifestyle modifications to improve her current health and to decrease her risk of future health problems.  AGREE: Multiple dietary modification options and treatment options were discussed and Vanessa Christensen agreed to follow the recommendations documented in the above note.  ARRANGE: Vanessa Christensen was educated on the importance of frequent visits to treat obesity as outlined per CMS and USPSTF guidelines and agreed to schedule her next follow up appointment today.  Attestation Statements:   Reviewed by clinician on day of visit: allergies, medications, problem list, medical history, surgical history, family history, social history, and previous encounter notes.  I, Lizbeth Bark, RMA, am acting as Location manager for [employer], DO.   I have reviewed the above documentation for accuracy and completeness, and I  agree with the above. Jearld Lesch, DO

## 2021-02-16 DIAGNOSIS — H401131 Primary open-angle glaucoma, bilateral, mild stage: Secondary | ICD-10-CM | POA: Diagnosis not present

## 2021-02-18 ENCOUNTER — Other Ambulatory Visit: Payer: Self-pay

## 2021-02-18 ENCOUNTER — Encounter: Payer: Self-pay | Admitting: Family Medicine

## 2021-02-18 ENCOUNTER — Ambulatory Visit (INDEPENDENT_AMBULATORY_CARE_PROVIDER_SITE_OTHER): Payer: Medicare Other | Admitting: Family Medicine

## 2021-02-18 VITALS — BP 116/74 | HR 77 | Temp 99.0°F | Resp 16 | Ht 62.0 in | Wt 198.2 lb

## 2021-02-18 DIAGNOSIS — E669 Obesity, unspecified: Secondary | ICD-10-CM

## 2021-02-18 DIAGNOSIS — E038 Other specified hypothyroidism: Secondary | ICD-10-CM | POA: Diagnosis not present

## 2021-02-18 DIAGNOSIS — N393 Stress incontinence (female) (male): Secondary | ICD-10-CM | POA: Diagnosis not present

## 2021-02-18 DIAGNOSIS — R413 Other amnesia: Secondary | ICD-10-CM | POA: Diagnosis not present

## 2021-02-18 MED ORDER — SOLIFENACIN SUCCINATE 5 MG PO TABS: 5.0000 mg | ORAL_TABLET | Freq: Every day | ORAL | 0 refills | Status: DC

## 2021-02-18 NOTE — Patient Instructions (Signed)
Follow up in 2-3 months to recheck memory episodes We'll notify you of your lab results and make any changes if needed Continue to work on healthy diet and regular exercise- you're doing great! Keep track of your memory episodes- log the date, what you are doing and how you are feeling Make sure you stay hydrated and are eating regularly Call with any questions or concerns Happy Fall!!

## 2021-02-18 NOTE — Progress Notes (Signed)
   Subjective:    Patient ID: Vanessa Christensen, female    DOB: Jul 13, 1950, 70 y.o.   MRN: [id number]  HPI Hypothyroid- chronic problem, on Levothyroxine 78mg daily.  Increased cravings in the last month.  No change in energy level or skin/hair/nails  Obesity- ongoing issue for pt.  [facility]  Pt has had a harder time w/ cravings recently.  Exercising regularly- going to gym 5x/week.  Memory issues- pt is having issues w/ remembering directions to places she has been frequently.  Has to call her husband to help her.  The last time she was confused she was feeling dehydrated and possibly hypoglycemic.  On Prevagen- feels this is helping.   Review of Systems For ROS see HPI   This visit occurred during the SARS-CoV-2 public health emergency.  Safety protocols were in place, including screening questions prior to the visit, additional usage of staff PPE, and extensive cleaning of exam room while observing appropriate contact time as indicated for disinfecting solutions.      Objective:   Physical Exam Vitals reviewed.  Constitutional:      General: She is not in acute distress.    Appearance: She is well-developed. She is obese. She is not ill-appearing.  HENT:     Head: Normocephalic and atraumatic.  Eyes:     Conjunctiva/sclera: Conjunctivae normal.     Pupils: Pupils are equal, round, and reactive to light.  Neck:     Thyroid: No thyromegaly.  Cardiovascular:     Rate and Rhythm: Normal rate and regular rhythm.     Pulses: Normal pulses.     Heart sounds: Normal heart sounds. No murmur heard. Pulmonary:     Effort: Pulmonary effort is normal. No respiratory distress.     Breath sounds: Normal breath sounds.  Abdominal:     General: There is no distension.     Palpations: Abdomen is soft.     Tenderness: There is no abdominal tenderness.  Musculoskeletal:     Cervical back: Normal range of motion and neck supple.     Right lower leg: No edema.      Left lower leg: No edema.  Lymphadenopathy:     Cervical: No cervical adenopathy.  Skin:    General: Skin is warm and dry.  Neurological:     Mental Status: She is alert and oriented to person, place, and time.  Psychiatric:        Behavior: Behavior normal.          Assessment & Plan:  Memory loss- pt feels that the episodes in which she has gotten confused and needed help w/ driving directions she was either dehydrated, hypoglycemic, or both.  She is on Prevagen which she feels is helping.  She will monitor her sxs and if she has any additional episodes she will let me know and we will pursue neuropsych testing.  Pt expressed understanding and is in agreement w/ plan.

## 2021-02-21 NOTE — Assessment & Plan Note (Signed)
Ongoing issue.  Continues to follow w/ [facility] but she has gained 5 lbs since last visit.  She continues to go to the gym 5x/week but admits to increased cravings recently and not following her diet plan.  Encouraged her to get back on track.  Will follow.

## 2021-02-21 NOTE — Assessment & Plan Note (Signed)
Chronic problem.  Pt reports increased cravings recently but no changes in energy level or changes to skin/hair/nails.  Check labs.  Adjust meds prn

## 2021-03-01 ENCOUNTER — Encounter (INDEPENDENT_AMBULATORY_CARE_PROVIDER_SITE_OTHER): Payer: Self-pay | Admitting: Bariatrics

## 2021-03-01 ENCOUNTER — Ambulatory Visit (INDEPENDENT_AMBULATORY_CARE_PROVIDER_SITE_OTHER): Payer: Medicare Other | Admitting: Bariatrics

## 2021-03-01 ENCOUNTER — Other Ambulatory Visit: Payer: Self-pay

## 2021-03-01 VITALS — BP 140/80 | HR 57 | Temp 97.5°F | Ht 62.0 in | Wt 194.0 lb

## 2021-03-01 DIAGNOSIS — E039 Hypothyroidism, unspecified: Secondary | ICD-10-CM | POA: Diagnosis not present

## 2021-03-01 DIAGNOSIS — Z6841 Body Mass Index (BMI) 40.0 and over, adult: Secondary | ICD-10-CM | POA: Diagnosis not present

## 2021-03-01 DIAGNOSIS — E8881 Metabolic syndrome: Secondary | ICD-10-CM | POA: Diagnosis not present

## 2021-03-01 NOTE — Progress Notes (Signed)
Chief Complaint:   OBESITY Vanessa Christensen is here to discuss her progress with her obesity treatment plan along with follow-up of her obesity related diagnoses. Vanessa Christensen is on the Category 4 Plan and states she is following her eating plan approximately 40% of the time. Vanessa Christensen states she is doing cardio and strength for 45-60 minutes 5 times per week.  Today's visit was #: 23 Starting weight: 241 lbs Starting date: 08/13/2018 Today's weight: 194 lbs Today's date: 03/01/2021 Total lbs lost to date: 47 lbs Total lbs lost since last in-office visit: 0  Interim History: Vanessa Christensen is up 1 lb since her last visit. She was working in [place] last week. Her muscle mass is up 1 1/2 lbs per the bioimpedance scale.   Subjective:   1. Insulin resistance Vanessa Christensen is currently not on medication.  2. Hypothyroidism, unspecified type Vanessa Christensen is taking Synthroid currently.  Assessment/Plan:   1. Insulin resistance Vanessa Christensen will continue to work on weight loss, exercise, and decreasing simple carbohydrates to help decrease the risk of diabetes. Vanessa Christensen agreed to follow-up with [name] as directed to closely monitor her progress.   2. Hypothyroidism, unspecified type Vanessa Christensen will continue Synthroid. Orders and follow up as documented in patient record.  Counseling Good thyroid control is important for overall health. Supratherapeutic thyroid levels are dangerous and will not improve weight loss results. Counseling: The correct way to take levothyroxine is fasting, with water, separated by at least 30 minutes from breakfast, and separated by more than 4 hours from calcium, iron, multivitamins, acid reflux medications (PPIs).    3. Obesity, current BMI 35.5 Vanessa Christensen is currently in the action stage of change. As such, her goal is to continue with weight loss efforts. She has agreed to the Category 4 Plan.   Vanessa Christensen will continue to meal plan and intentional eating.  Exercise goals:  Vanessa Christensen will continue to work  out.  Behavioral modification strategies: increasing lean protein intake, decreasing simple carbohydrates, increasing vegetables, increasing water intake, decreasing eating out, no skipping meals, meal planning and cooking strategies, keeping healthy foods in the home, and planning for success.  Vanessa Christensen has agreed to follow-up with our clinic in 4 weeks. She was informed of the importance of frequent follow-up visits to maximize her success with intensive lifestyle modifications for her multiple health conditions.   Objective:   Blood pressure 140/80, pulse (!) 57, temperature (!) 97.5 F (36.4 C), height 5\' 2"  (1.575 m), weight 194 lb (88 kg), SpO2 100 %. Body mass index is 35.48 kg/m.  General: Cooperative, alert, well developed, in no acute distress. HEENT: Conjunctivae and lids unremarkable. Cardiovascular: Regular rhythm.  Lungs: Normal work of breathing. Neurologic: No focal deficits.   Lab Results  Component Value Date   CREATININE 0.63 12/08/2020   BUN 21 12/08/2020   NA 141 12/08/2020   K 4.7 12/08/2020   CL 104 12/08/2020   CO2 24 12/08/2020   Lab Results  Component Value Date   ALT 20 12/08/2020   AST 20 12/08/2020   ALKPHOS 76 12/08/2020   BILITOT 0.4 12/08/2020   Lab Results  Component Value Date   HGBA1C 5.2 12/08/2020   HGBA1C 5.6 01/15/2020   HGBA1C 5.4 11/04/2018   HGBA1C 5.6 08/13/2018   Lab Results  Component Value Date   INSULIN 4.7 12/08/2020   INSULIN 8.0 01/15/2020   INSULIN 10.9 11/04/2018   INSULIN 13.2 08/13/2018   Lab Results  Component Value Date   TSH 1.050 12/08/2020   Lab  Results  Component Value Date   CHOL 189 12/08/2020   HDL 47 12/08/2020   LDLCALC 127 (H) 12/08/2020   TRIG 80 12/08/2020   CHOLHDL 4 05/08/2018   Lab Results  Component Value Date   VD25OH 74.7 12/08/2020   VD25OH 36.9 01/15/2020   VD25OH 42.4 11/04/2018   Lab Results  Component Value Date   WBC 3.0 (L) 07/30/2020   HGB 11.6 (L) 07/30/2020   HCT  37.1 07/30/2020   MCV 87.9 07/30/2020   PLT 180 07/30/2020   No results found for: IRON, TIBC, FERRITIN  Obesity Behavioral Intervention:   Approximately 15 minutes were spent on the discussion below.  ASK: We discussed the diagnosis of obesity with [name] today and Gwenda agreed to give [name] permission to discuss obesity behavioral modification therapy today.  ASSESS: Michel has the diagnosis of obesity and her BMI today is 35.5. Ahlaya is in the action stage of change.   ADVISE: Brendolyn was educated on the multiple health risks of obesity as well as the benefit of weight loss to improve her health. She was advised of the need for long term treatment and the importance of lifestyle modifications to improve her current health and to decrease her risk of future health problems.  AGREE: Multiple dietary modification options and treatment options were discussed and Kristalynn agreed to follow the recommendations documented in the above note.  ARRANGE: Millisa was educated on the importance of frequent visits to treat obesity as outlined per CMS and USPSTF guidelines and agreed to schedule her next follow up appointment today.  Attestation Statements:   Reviewed by clinician on day of visit: allergies, medications, problem list, medical history, surgical history, family history, social history, and previous encounter notes.   I, Vanessa Christensen, RMA, am acting as Location manager for [employer], DO.   I have reviewed the above documentation for accuracy and completeness, and I agree with the above. Jearld Lesch, DO

## 2021-03-02 ENCOUNTER — Encounter (INDEPENDENT_AMBULATORY_CARE_PROVIDER_SITE_OTHER): Payer: Self-pay | Admitting: Bariatrics

## 2021-03-16 ENCOUNTER — Telehealth: Payer: Self-pay

## 2021-03-16 NOTE — Progress Notes (Signed)
    Chronic Care Management Pharmacy Assistant   Name: Vanessa Christensen  MRN: [id number] DOB: 06-Jul-1950   Reason for Encounter: Disease State - General Adherence Call    Recent office visits:  02/18/21 Annye Asa, MD (PCP) - Family Medicine - Labs were ordered. Follow up in 2-3 month.   Recent consult visits:  03/01/21 Jearld Lesch, MD - Bariatrics - Insulin Resistance - dietary counseling provided. Follow up in 4 weeks.   02/01/21 Jearld Lesch, MD - Family Medicine -  No medication changes. Follow up in 4 weeks.    Hospital visits:  None in previous 6 months  Medications: Outpatient Encounter Medications as of 03/16/2021  Medication Sig Note   acetaminophen (TYLENOL) 500 MG tablet Take 1,000 mg by mouth every 6 (six) hours as needed (pain).    Apoaequorin (PREVAGEN PO) Take 1 tablet by mouth daily.     Ascorbic Acid (VITAMIN C PO) Take 1 tablet by mouth daily.    aspirin EC 81 MG tablet Take 81 mg by mouth daily. Swallow whole.    AZOPT 1 % ophthalmic suspension Place 1 drop into both eyes in the morning and at bedtime.     B Complex-C (SUPER B COMPLEX PO) Take 1 tablet by mouth daily after supper.    FLUoxetine (PROZAC) 40 MG capsule Take 1 capsule (40 mg total) by mouth daily.    levothyroxine (SYNTHROID) 75 MCG tablet TAKE 1 TABLET BY MOUTH EVERY DAY    meclizine (ANTIVERT) 25 MG tablet Take 25 mg by mouth 3 (three) times daily as needed for dizziness. 04/27/2020: rarely   Omega-3 Fatty Acids (FISH OIL ADULT GUMMIES) 113.5 MG CHEW Chew 2 tablets by mouth daily at 2 PM.    psyllium (METAMUCIL) 58.6 % powder Take 1 packet by mouth daily.     solifenacin (VESICARE) 5 MG tablet Take 1 tablet (5 mg total) by mouth daily.    traZODone (DESYREL) 50 MG tablet TAKE 0.5-1 TABLETS BY MOUTH AT BEDTIME AS NEEDED FOR SLEEP.    No facility-administered encounter medications on file as of 03/16/2021.    Have you had any problems recently with your health? Patient denied any recent  problems with her health.   Have you had any problems with your pharmacy? Patient denied any problems with her current pharmacy.  What issues or side effects are you having with your medications? Patient denied any issues or side effects with her current medications.   What would you like me to pass along to Madelin Rear, CPP for them to help you with?  Patient did not have anything to pass along to CPP at this time.   What can we do to take care of you better? Patient did not have any suggestions at this time. Stated she is satisfied with her current level of care.   Care Gaps  AWV: done 07/05/20 Colonoscopy:  due 09/23/22 DM Eye Exam: N/A DM Foot Exam: N/A Microalbumin: N/A HbgAIC: 12/08/20 (5.2) DEXA: done 02/07/20 Mammogram:     Star Rating Drugs: No Star Rating Drugs Noted.   Future Appointments  Date Time Provider [place]  03/29/2021 11:20 AM Jearld Lesch A, DO [facility] None  07/28/2021  9:00 AM [facility] None  07/28/2021  9:30 AM Alla Feeling, NP [facility] None    Jobe Gibbon, [facility] Clinical Pharmacist Assistant  [phone]  Time Spent: 38 minutes

## 2021-03-29 ENCOUNTER — Ambulatory Visit (INDEPENDENT_AMBULATORY_CARE_PROVIDER_SITE_OTHER): Payer: Medicare Other | Admitting: Bariatrics

## 2021-03-29 ENCOUNTER — Other Ambulatory Visit: Payer: Self-pay

## 2021-03-29 ENCOUNTER — Encounter (INDEPENDENT_AMBULATORY_CARE_PROVIDER_SITE_OTHER): Payer: Self-pay | Admitting: Bariatrics

## 2021-03-29 VITALS — BP 146/70 | HR 75 | Temp 98.3°F | Ht 62.0 in | Wt 198.0 lb

## 2021-03-29 DIAGNOSIS — E038 Other specified hypothyroidism: Secondary | ICD-10-CM | POA: Diagnosis not present

## 2021-03-29 DIAGNOSIS — Z6841 Body Mass Index (BMI) 40.0 and over, adult: Secondary | ICD-10-CM

## 2021-03-29 DIAGNOSIS — E8881 Metabolic syndrome: Secondary | ICD-10-CM | POA: Diagnosis not present

## 2021-03-29 NOTE — Progress Notes (Signed)
Chief Complaint:   OBESITY Markella is here to discuss her progress with her obesity treatment plan along with follow-up of her obesity related diagnoses. Haneefah is on the Category 3 Plan and states she is following her eating plan approximately 30% of the time. Johnanna states she is doing cardio for 60 minutes 5 times per week.  Today's visit was #: 29 Starting weight: 241 lbs Starting date: 08/12/2020 Today's weight: 198 lbs Today's date: 03/29/2021 Total lbs lost to date: 43 Total lbs lost since last in-office visit: 0  Interim History: Mykira is up 4 lbs since her last visit, as she has been traveling since her last visit.  Subjective:   1. Other specified hypothyroidism Dan is taking Synthroid currently.  2. Insulin resistance Shandiin is not on medications currently.  Assessment/Plan:   1. Other specified hypothyroidism Alivea will continue Synthroid. Orders and follow up as documented in patient record.  Counseling Good thyroid control is important for overall health. Supratherapeutic thyroid levels are dangerous and will not improve weight loss results. Counseling: The correct way to take levothyroxine is fasting, with water, separated by at least 30 minutes from breakfast, and separated by more than 4 hours from calcium, iron, multivitamins, acid reflux medications (PPIs).   2. Insulin resistance Marilea will keep all carbohydrates low except non-starchy vegetable. Torsha agreed to follow-up with [name] as directed to closely monitor her progress.  3. Obesity, current BMI 36.2 Donnalynn is currently in the action stage of change. As such, her goal is to continue with weight loss efforts. She has agreed to the Category 3 Plan.   We discussed tips for when traveling. Caitlyn will adhere closely to the plan.  Exercise goals: As is.  Behavioral modification strategies: increasing lean protein intake, decreasing simple carbohydrates, increasing vegetables, increasing water  intake, decreasing eating out, no skipping meals, meal planning and cooking strategies, and planning for success.  Gracey has agreed to follow-up with our clinic in 4 weeks. She was informed of the importance of frequent follow-up visits to maximize her success with intensive lifestyle modifications for her multiple health conditions.   Objective:   Blood pressure (!) 146/70, pulse 75, temperature 98.3 F (36.8 C), height 5\' 2"  (1.575 m), weight 198 lb (89.8 kg), SpO2 97 %. Body mass index is 36.21 kg/m.  General: Cooperative, alert, well developed, in no acute distress. HEENT: Conjunctivae and lids unremarkable. Cardiovascular: Regular rhythm.  Lungs: Normal work of breathing. Neurologic: No focal deficits.   Lab Results  Component Value Date   CREATININE 0.63 12/08/2020   BUN 21 12/08/2020   NA 141 12/08/2020   K 4.7 12/08/2020   CL 104 12/08/2020   CO2 24 12/08/2020   Lab Results  Component Value Date   ALT 20 12/08/2020   AST 20 12/08/2020   ALKPHOS 76 12/08/2020   BILITOT 0.4 12/08/2020   Lab Results  Component Value Date   HGBA1C 5.2 12/08/2020   HGBA1C 5.6 01/15/2020   HGBA1C 5.4 11/04/2018   HGBA1C 5.6 08/13/2018   Lab Results  Component Value Date   INSULIN 4.7 12/08/2020   INSULIN 8.0 01/15/2020   INSULIN 10.9 11/04/2018   INSULIN 13.2 08/13/2018   Lab Results  Component Value Date   TSH 1.050 12/08/2020   Lab Results  Component Value Date   CHOL 189 12/08/2020   HDL 47 12/08/2020   LDLCALC 127 (H) 12/08/2020   TRIG 80 12/08/2020   CHOLHDL 4 05/08/2018   Lab Results  Component Value Date   VD25OH 74.7 12/08/2020   VD25OH 36.9 01/15/2020   VD25OH 42.4 11/04/2018   Lab Results  Component Value Date   WBC 3.0 (L) 07/30/2020   HGB 11.6 (L) 07/30/2020   HCT 37.1 07/30/2020   MCV 87.9 07/30/2020   PLT 180 07/30/2020   No results found for: IRON, TIBC, FERRITIN  Obesity Behavioral Intervention:   Approximately 15 minutes were spent on  the discussion below.  ASK: We discussed the diagnosis of obesity with [name] today and Aven agreed to give [name] permission to discuss obesity behavioral modification therapy today.  ASSESS: Michell has the diagnosis of obesity and her BMI today is 36.2. Kassy is in the action stage of change.   ADVISE: Eriyana was educated on the multiple health risks of obesity as well as the benefit of weight loss to improve her health. She was advised of the need for long term treatment and the importance of lifestyle modifications to improve her current health and to decrease her risk of future health problems.  AGREE: Multiple dietary modification options and treatment options were discussed and Vianna agreed to follow the recommendations documented in the above note.  ARRANGE: Makaylia was educated on the importance of frequent visits to treat obesity as outlined per CMS and USPSTF guidelines and agreed to schedule her next follow up appointment today.  Attestation Statements:   Reviewed by clinician on day of visit: allergies, medications, problem list, medical history, surgical history, family history, social history, and previous encounter notes.   Wilhemena Durie, am acting as Location manager for [employer], DO.  I have reviewed the above documentation for accuracy and completeness, and I agree with the above. Jearld Lesch, DO

## 2021-03-30 DIAGNOSIS — Z23 Encounter for immunization: Secondary | ICD-10-CM | POA: Diagnosis not present

## 2021-03-31 ENCOUNTER — Other Ambulatory Visit: Payer: Self-pay | Admitting: Family Medicine

## 2021-03-31 ENCOUNTER — Encounter (INDEPENDENT_AMBULATORY_CARE_PROVIDER_SITE_OTHER): Payer: Self-pay | Admitting: Bariatrics

## 2021-03-31 NOTE — Telephone Encounter (Signed)
Per patient's chart she is also seeing another Fam med Dr. Tally Due. Just wanted to check on that with you as far as refills

## 2021-04-20 DIAGNOSIS — H401131 Primary open-angle glaucoma, bilateral, mild stage: Secondary | ICD-10-CM | POA: Diagnosis not present

## 2021-04-25 ENCOUNTER — Other Ambulatory Visit: Payer: Self-pay

## 2021-04-25 ENCOUNTER — Encounter (INDEPENDENT_AMBULATORY_CARE_PROVIDER_SITE_OTHER): Payer: Self-pay | Admitting: Bariatrics

## 2021-04-25 ENCOUNTER — Ambulatory Visit (INDEPENDENT_AMBULATORY_CARE_PROVIDER_SITE_OTHER): Payer: Medicare Other | Admitting: Bariatrics

## 2021-04-25 VITALS — BP 146/84 | HR 73 | Temp 97.7°F | Ht 62.0 in | Wt 198.0 lb

## 2021-04-25 DIAGNOSIS — E8881 Metabolic syndrome: Secondary | ICD-10-CM

## 2021-04-25 DIAGNOSIS — E559 Vitamin D deficiency, unspecified: Secondary | ICD-10-CM

## 2021-04-25 DIAGNOSIS — Z6841 Body Mass Index (BMI) 40.0 and over, adult: Secondary | ICD-10-CM

## 2021-04-25 NOTE — Progress Notes (Signed)
Chief Complaint:   OBESITY Vanessa Christensen is here to discuss her progress with her obesity treatment plan along with follow-up of her obesity related diagnoses. Vanessa Christensen is on the Category 4 Plan and states she is following her eating plan approximately 50% of the time. Vanessa Christensen states she is doing cardio and weights for 60 minutes 4-5 times per week.  Today's visit was #: 30 Starting weight: 241 lbs Starting date: 08/12/2020 Today's weight: 198 lbs Today's date: 04/25/2021 Total lbs lost to date: 43 lbs Total lbs lost since last in-office visit: 0  Interim History: Vanessa Christensen's weight remains stable. She is going back for seconds.  Subjective:   1. Vitamin D deficiency Vanessa Christensen is currently taking Vitamin D.  2. Insulin resistance Vanessa Christensen is taking Synthroid currently.  Assessment/Plan:   1. Vitamin D deficiency Low Vitamin D level contributes to fatigue and are associated with obesity, breast, and colon cancer. Vanessa Christensen agrees to continue to take prescription Vitamin D 50,000 IU every week and she will follow-up for routine testing of Vitamin D, at least 2-3 times per year to avoid over-replacement.  2. Insulin resistance Vanessa Christensen will continue taking Synthroid, She will continue to work on weight loss, exercise, and decreasing simple carbohydrates to help decrease the risk of diabetes. Vanessa Christensen agreed to follow-up with [name] as directed to closely monitor her progress.  3. Obesity, current BMI 36.2 Vanessa Christensen is currently in the action stage of change. As such, her goal is to continue with weight loss efforts. She has agreed to the Category 4 Plan.   Vanessa Christensen will continue meal planning and intentional eating. She will adhere closely to the plan. She will get candy out of the house.  Exercise goals:  As is.  Behavioral modification strategies: increasing lean protein intake, decreasing simple carbohydrates, increasing vegetables, increasing water intake, decreasing eating out, no skipping meals, meal  planning and cooking strategies, keeping healthy foods in the home, and planning for success.  Vanessa Christensen has agreed to follow-up with our clinic in 4-6 weeks. She was informed of the importance of frequent follow-up visits to maximize her success with intensive lifestyle modifications for her multiple health conditions.   Objective:   Blood pressure (!) 146/84, pulse 73, temperature 97.7 F (36.5 C), height 5\' 2"  (1.575 m), weight 198 lb (89.8 kg), SpO2 99 %. Body mass index is 36.21 kg/m.  General: Cooperative, alert, well developed, in no acute distress. HEENT: Conjunctivae and lids unremarkable. Cardiovascular: Regular rhythm.  Lungs: Normal work of breathing. Neurologic: No focal deficits.   Lab Results  Component Value Date   CREATININE 0.63 12/08/2020   BUN 21 12/08/2020   NA 141 12/08/2020   K 4.7 12/08/2020   CL 104 12/08/2020   CO2 24 12/08/2020   Lab Results  Component Value Date   ALT 20 12/08/2020   AST 20 12/08/2020   ALKPHOS 76 12/08/2020   BILITOT 0.4 12/08/2020   Lab Results  Component Value Date   HGBA1C 5.2 12/08/2020   HGBA1C 5.6 01/15/2020   HGBA1C 5.4 11/04/2018   HGBA1C 5.6 08/13/2018   Lab Results  Component Value Date   INSULIN 4.7 12/08/2020   INSULIN 8.0 01/15/2020   INSULIN 10.9 11/04/2018   INSULIN 13.2 08/13/2018   Lab Results  Component Value Date   TSH 1.050 12/08/2020   Lab Results  Component Value Date   CHOL 189 12/08/2020   HDL 47 12/08/2020   LDLCALC 127 (H) 12/08/2020   TRIG 80 12/08/2020   CHOLHDL  4 05/08/2018   Lab Results  Component Value Date   VD25OH 74.7 12/08/2020   VD25OH 36.9 01/15/2020   VD25OH 42.4 11/04/2018   Lab Results  Component Value Date   WBC 3.0 (L) 07/30/2020   HGB 11.6 (L) 07/30/2020   HCT 37.1 07/30/2020   MCV 87.9 07/30/2020   PLT 180 07/30/2020   No results found for: IRON, TIBC, FERRITIN  Obesity Behavioral Intervention:   Approximately 15 minutes were spent on the discussion  below.  ASK: We discussed the diagnosis of obesity with Vanessa Christensen today and Vanessa Christensen agreed to give [name] permission to discuss obesity behavioral modification therapy today.  ASSESS: Vanessa Christensen has the diagnosis of obesity and her BMI today is 36.2. Vanessa Christensen is in the action stage of change.   ADVISE: Vanessa Christensen was educated on the multiple health risks of obesity as well as the benefit of weight loss to improve her health. She was advised of the need for long term treatment and the importance of lifestyle modifications to improve her current health and to decrease her risk of future health problems.  AGREE: Multiple dietary modification options and treatment options were discussed and Vanessa Christensen agreed to follow the recommendations documented in the above note.  ARRANGE: Vanessa Christensen was educated on the importance of frequent visits to treat obesity as outlined per CMS and USPSTF guidelines and agreed to schedule her next follow up appointment today.  Attestation Statements:   Reviewed by clinician on day of visit: allergies, medications, problem list, medical history, surgical history, family history, social history, and previous encounter notes.  I, Vanessa Christensen, RMA, am acting as Location manager for [employer], DO.   I have reviewed the above documentation for accuracy and completeness, and I agree with the above. Vanessa Lesch, DO

## 2021-04-26 ENCOUNTER — Encounter (INDEPENDENT_AMBULATORY_CARE_PROVIDER_SITE_OTHER): Payer: Self-pay | Admitting: Bariatrics

## 2021-06-08 ENCOUNTER — Other Ambulatory Visit: Payer: Self-pay

## 2021-06-08 ENCOUNTER — Ambulatory Visit (INDEPENDENT_AMBULATORY_CARE_PROVIDER_SITE_OTHER): Payer: Medicare Other | Admitting: Bariatrics

## 2021-06-08 ENCOUNTER — Encounter (INDEPENDENT_AMBULATORY_CARE_PROVIDER_SITE_OTHER): Payer: Self-pay | Admitting: Bariatrics

## 2021-06-08 VITALS — BP 146/84 | HR 65 | Temp 97.8°F | Ht 62.0 in | Wt 195.0 lb

## 2021-06-08 DIAGNOSIS — E559 Vitamin D deficiency, unspecified: Secondary | ICD-10-CM

## 2021-06-08 DIAGNOSIS — E038 Other specified hypothyroidism: Secondary | ICD-10-CM

## 2021-06-08 DIAGNOSIS — N393 Stress incontinence (female) (male): Secondary | ICD-10-CM | POA: Diagnosis not present

## 2021-06-08 DIAGNOSIS — Z6841 Body Mass Index (BMI) 40.0 and over, adult: Secondary | ICD-10-CM | POA: Diagnosis not present

## 2021-06-08 MED ORDER — SOLIFENACIN SUCCINATE 5 MG PO TABS: 5.0000 mg | ORAL_TABLET | Freq: Every day | ORAL | 0 refills | Status: DC

## 2021-06-09 ENCOUNTER — Encounter (INDEPENDENT_AMBULATORY_CARE_PROVIDER_SITE_OTHER): Payer: Self-pay | Admitting: Bariatrics

## 2021-06-09 NOTE — Progress Notes (Signed)
Chief Complaint:   OBESITY Vanessa Christensen is here to discuss her progress with her obesity treatment plan along with follow-up of her obesity related diagnoses. Vanessa Christensen is on the Category 3 Plan and states she is following her eating plan approximately 35% of the time. [name] states she is doing cardio and weights for 60 minutes 5 times per week.  Today's visit was #: 59 Starting weight: 241 lbs Starting date: 08/12/2020 Today's weight: 195 lbs Today's date: 06/08/2021 Total lbs lost to date: 46 lbs Total lbs lost since last in-office visit: 3 lbs  Interim History: [name] is down 3 lbs since her last visit.   Subjective:   1. Other specified hypothyroidism Vanessa Christensen notes energy. She is currently taking Synthroid.  2. Vitamin D deficiency [name] is taking OTC Vitamin D currently.   3. Stress incontinence [name] is currently taking Vesicare and she needs a prescription. She states that the medication is effective.   Assessment/Plan:   1. Other specified hypothyroidism Dustee will continue taking Synthroid. Orders and follow up as documented in patient record.  Counseling Good thyroid control is important for overall health. Supratherapeutic thyroid levels are dangerous and will not improve weight loss results. Counseling: The correct way to take levothyroxine is fasting, with water, separated by at least 30 minutes from breakfast, and separated by more than 4 hours from calcium, iron, multivitamins, acid reflux medications (PPIs).    2. Vitamin D deficiency Low Vitamin D level contributes to fatigue and are associated with obesity, breast, and colon cancer. Vanessa Christensen agrees to continue to take OTC Vitamin D and she will follow-up for routine testing of Vitamin D, at least 2-3 times per year to avoid over-replacement.  3. Stress incontinence We will refill Vesicare 5 mg daily for 3 months with no refills.   - solifenacin (VESICARE) 5 MG tablet; Take 1 tablet (5 mg total) by mouth daily.   Dispense: 90 tablet; Refill: 0  4. Obesity, current BMI 35.7 Vanessa Christensen is currently in the action stage of change. As such, her goal is to continue with weight loss efforts. She has agreed to the Category 3 Plan.   [name] will continue meal planning and she will continue intentional eating. Homemade seasoning for variety was provided today.   Exercise goals:  As is.   Behavioral modification strategies: increasing lean protein intake, decreasing simple carbohydrates, increasing vegetables, increasing water intake, decreasing eating out, no skipping meals, meal planning and cooking strategies, keeping healthy foods in the home, and planning for success.  Vanessa Christensen has agreed to follow-up with our clinic in 4 weeks. She was informed of the importance of frequent follow-up visits to maximize her success with intensive lifestyle modifications for her multiple health conditions.   Objective:   Blood pressure (!) 146/84, pulse 65, temperature 97.8 F (36.6 C), height 5\' 2"  (1.575 m), weight 195 lb (88.5 kg), SpO2 100 %. Body mass index is 35.67 kg/m.  General: Cooperative, alert, well developed, in no acute distress. HEENT: Conjunctivae and lids unremarkable. Cardiovascular: Regular rhythm.  Lungs: Normal work of breathing. Neurologic: No focal deficits.   Lab Results  Component Value Date   CREATININE 0.63 12/08/2020   BUN 21 12/08/2020   NA 141 12/08/2020   K 4.7 12/08/2020   CL 104 12/08/2020   CO2 24 12/08/2020   Lab Results  Component Value Date   ALT 20 12/08/2020   AST 20 12/08/2020   ALKPHOS 76 12/08/2020   BILITOT 0.4 12/08/2020   Lab Results  Component Value Date   HGBA1C 5.2 12/08/2020   HGBA1C 5.6 01/15/2020   HGBA1C 5.4 11/04/2018   HGBA1C 5.6 08/13/2018   Lab Results  Component Value Date   INSULIN 4.7 12/08/2020   INSULIN 8.0 01/15/2020   INSULIN 10.9 11/04/2018   INSULIN 13.2 08/13/2018   Lab Results  Component Value Date   TSH 1.050 12/08/2020   Lab  Results  Component Value Date   CHOL 189 12/08/2020   HDL 47 12/08/2020   LDLCALC 127 (H) 12/08/2020   TRIG 80 12/08/2020   CHOLHDL 4 05/08/2018   Lab Results  Component Value Date   VD25OH 74.7 12/08/2020   VD25OH 36.9 01/15/2020   VD25OH 42.4 11/04/2018   Lab Results  Component Value Date   WBC 3.0 (L) 07/30/2020   HGB 11.6 (L) 07/30/2020   HCT 37.1 07/30/2020   MCV 87.9 07/30/2020   PLT 180 07/30/2020   No results found for: IRON, TIBC, FERRITIN  Attestation Statements:   Reviewed by clinician on day of visit: allergies, medications, problem list, medical history, surgical history, family history, social history, and previous encounter notes.  I, Lizbeth Bark, RMA, am acting as Location manager for [employer], DO.  I have reviewed the above documentation for accuracy and completeness, and I agree with the above. Jearld Lesch, DO

## 2021-06-10 ENCOUNTER — Other Ambulatory Visit: Payer: Self-pay | Admitting: Family Medicine

## 2021-07-06 ENCOUNTER — Ambulatory Visit (INDEPENDENT_AMBULATORY_CARE_PROVIDER_SITE_OTHER): Payer: Medicare Other | Admitting: Bariatrics

## 2021-07-06 ENCOUNTER — Encounter (INDEPENDENT_AMBULATORY_CARE_PROVIDER_SITE_OTHER): Payer: Self-pay | Admitting: Bariatrics

## 2021-07-06 ENCOUNTER — Other Ambulatory Visit: Payer: Self-pay

## 2021-07-06 VITALS — BP 148/86 | HR 71 | Temp 98.4°F | Ht 62.0 in | Wt 197.0 lb

## 2021-07-06 DIAGNOSIS — R7303 Prediabetes: Secondary | ICD-10-CM | POA: Diagnosis not present

## 2021-07-06 DIAGNOSIS — E038 Other specified hypothyroidism: Secondary | ICD-10-CM

## 2021-07-06 DIAGNOSIS — Z6836 Body mass index (BMI) 36.0-36.9, adult: Secondary | ICD-10-CM

## 2021-07-06 DIAGNOSIS — E669 Obesity, unspecified: Secondary | ICD-10-CM

## 2021-07-06 NOTE — Progress Notes (Signed)
Chief Complaint:   OBESITY Vanessa Christensen is here to discuss her progress with her obesity treatment plan along with follow-up of her obesity related diagnoses. Vanessa Christensen is on the Category 4 Plan and states she is following her eating plan approximately 35% of the time. Vanessa Christensen states she is doing cardio and weights for 60 minutes 5 times per week.  Today's visit was #: 19 Starting weight: 241 lbs Starting date: 08/12/2020 Today's weight: 197 lbs Today's date: 07/06/2021 Total lbs lost to date: 44 lbs Total lbs lost since last in-office visit: 0  Interim History: Damita is up 2 lbs since her last visit. She has been stressed as her spouse had an MI, but doing better.   We reviewed labs from 06/15/2021 C-Reactive protein, A1C and TSH.  Subjective:   1. Other specified hypothyroidism Vanessa Christensen is taking Synthroid currently.  2. Pre-diabetes Vanessa Christensen is currently not on medications. Her last A1C was 5.7.  Assessment/Plan:   1. Other specified hypothyroidism Vanessa Christensen will continue taking Synthroid. Orders and follow up as documented in patient record.  Counseling Good thyroid control is important for overall health. Supratherapeutic thyroid levels are dangerous and will not improve weight loss results. Counseling: The correct way to take levothyroxine is fasting, with water, separated by at least 30 minutes from breakfast, and separated by more than 4 hours from calcium, iron, multivitamins, acid reflux medications (PPIs).    2. Pre-diabetes Vanessa Christensen will continue to work on keeping carbohydrates low and exercise high.  3. Obesity, current BMI 36.0 Vanessa Christensen is currently in the action stage of change. As such, her goal is to continue with weight loss efforts. She has agreed to the Category 4 Plan.   Vanessa Christensen will continue meal planning. She will continue activity (exercise). We reviewed labs from 06/15/2021 C-Reactive protein, A1C and TSH.  Exercise goals:  As is.  Behavioral modification  strategies: increasing lean protein intake, decreasing simple carbohydrates, increasing vegetables, increasing water intake, decreasing eating out, no skipping meals, meal planning and cooking strategies, keeping healthy foods in the home, and planning for success.  Vanessa Christensen has agreed to follow-up with our clinic in 4 weeks. She was informed of the importance of frequent follow-up visits to maximize her success with intensive lifestyle modifications for her multiple health conditions.   Objective:   Blood pressure (!) 148/86, pulse 71, temperature 98.4 F (36.9 C), height 5\' 2"  (1.575 m), weight 197 lb (89.4 kg), SpO2 99 %. Body mass index is 36.03 kg/m.  General: Cooperative, alert, well developed, in no acute distress. HEENT: Conjunctivae and lids unremarkable. Cardiovascular: Regular rhythm.  Lungs: Normal work of breathing. Neurologic: No focal deficits.   Lab Results  Component Value Date   CREATININE 0.63 12/08/2020   BUN 21 12/08/2020   NA 141 12/08/2020   K 4.7 12/08/2020   CL 104 12/08/2020   CO2 24 12/08/2020   Lab Results  Component Value Date   ALT 20 12/08/2020   AST 20 12/08/2020   ALKPHOS 76 12/08/2020   BILITOT 0.4 12/08/2020   Lab Results  Component Value Date   HGBA1C 5.2 12/08/2020   HGBA1C 5.6 01/15/2020   HGBA1C 5.4 11/04/2018   HGBA1C 5.6 08/13/2018   Lab Results  Component Value Date   INSULIN 4.7 12/08/2020   INSULIN 8.0 01/15/2020   INSULIN 10.9 11/04/2018   INSULIN 13.2 08/13/2018   Lab Results  Component Value Date   TSH 1.050 12/08/2020   Lab Results  Component Value Date  CHOL 189 12/08/2020   HDL 47 12/08/2020   LDLCALC 127 (H) 12/08/2020   TRIG 80 12/08/2020   CHOLHDL 4 05/08/2018   Lab Results  Component Value Date   VD25OH 74.7 12/08/2020   VD25OH 36.9 01/15/2020   VD25OH 42.4 11/04/2018   Lab Results  Component Value Date   WBC 3.0 (L) 07/30/2020   HGB 11.6 (L) 07/30/2020   HCT 37.1 07/30/2020   MCV 87.9  07/30/2020   PLT 180 07/30/2020   No results found for: IRON, TIBC, FERRITIN  Attestation Statements:   Reviewed by clinician on day of visit: allergies, medications, problem list, medical history, surgical history, family history, social history, and previous encounter notes.  I, Lizbeth Bark, RMA, am acting as Location manager for [employer], DO.  I have reviewed the above documentation for accuracy and completeness, and I agree with the above. Jearld Lesch, DO

## 2021-07-07 ENCOUNTER — Encounter: Payer: Self-pay | Admitting: Physician Assistant

## 2021-07-07 ENCOUNTER — Ambulatory Visit (INDEPENDENT_AMBULATORY_CARE_PROVIDER_SITE_OTHER): Payer: Medicare Other | Admitting: Physician Assistant

## 2021-07-07 ENCOUNTER — Ambulatory Visit (INDEPENDENT_AMBULATORY_CARE_PROVIDER_SITE_OTHER): Payer: Medicare Other

## 2021-07-07 DIAGNOSIS — M25562 Pain in left knee: Secondary | ICD-10-CM

## 2021-07-07 DIAGNOSIS — G8929 Other chronic pain: Secondary | ICD-10-CM | POA: Diagnosis not present

## 2021-07-07 MED ORDER — METHYLPREDNISOLONE ACETATE 40 MG/ML IJ SUSP
40.0000 mg | INTRAMUSCULAR | Status: AC | PRN
  Administered 2021-07-07: 40 mg via INTRA_ARTICULAR

## 2021-07-07 MED ORDER — LIDOCAINE HCL 1 % IJ SOLN
3.0000 mL | INTRAMUSCULAR | Status: AC | PRN
  Administered 2021-07-07: 3 mL

## 2021-07-07 NOTE — Progress Notes (Signed)
Office Visit Note   Patient: Vanessa Christensen           Date of Birth: Jul 11, 1950           MRN: [id number] Visit Date: 07/07/2021              Requested by: [name], MD [address] [name] [address] [place],  [place] [address] PCP: [name], MD   Assessment & Plan: Visit Diagnoses:  1. Chronic pain of left knee     Plan: We will plan on seeing her back in mid April as she wants to schedule left total knee arthroplasty in late May.  Questions were encouraged and answered at length today.  She tolerated left knee injection well today.  Follow-Up Instructions: Return in about 11 weeks (around 09/22/2021).   Orders:  Orders Placed This Encounter  Procedures   Large Joint Inj   XR Knee 1-2 Views Left   No orders of the defined types were placed in this encounter.     Procedures: Large Joint Inj on 07/07/2021 4:45 PM Indications: pain Details: 22 G 1.5 in needle, superolateral approach  Arthrogram: No  Medications: 3 mL lidocaine 1 %; 40 mg methylPREDNISolone acetate 40 MG/ML Aspirate: 2 mL blood-tinged Outcome: tolerated well, no immediate complications Procedure, treatment alternatives, risks and benefits explained, specific risks discussed. Consent was given by the patient. Immediately prior to procedure a time out was called to verify the correct patient, procedure, equipment, support staff and site/side marked as required. Patient was prepped and draped in the usual sterile fashion.      Clinical Data: No additional findings.   Subjective: Chief Complaint  Patient presents with   Left Knee - Pain    HPI Vanessa Christensen is well-known to Dr. Ninfa Linden service comes in today due to a knot on the inside of her left knee.  She has no end-stage arthritis of her left knee.  She is planning on having surgery in January on her knee but not scheduled.  She is also recently been dealing with her husband who had a reported MI and required a cardiac stent.  She states she  is having increased pain and swelling in the anterior medial aspect left knee.  States it appeared about 2 weeks ago.  No injury.  She states she is having trouble working out at the gym due to the pain in the knee.  No new injury.  Negative for fevers chills.  She is nondiabetic.  Review of Systems See HPI  Objective: Vital Signs: There were no vitals taken for this visit.  Physical Exam General: Well-developed well-nourished female no acute distress Ortho Exam Left knee good range of motion of the left knee with tenderness along medial joint line no instability valgus varus stressing.  No abnormal warmth.  Plus minus effusion.  No ecchymosis about the knee.  No  rashes or impending ulcerations.  Specialty Comments:  No specialty comments available.  Imaging: XR Knee 1-2 Views Left  Result Date: 07/07/2021 Left knee 2 views: No acute fractures.  Tricompartmental arthritic changes with varus deformity.  No significant arthritic advancement compared to films taken in June 2022.    PMFS History: Patient Active Problem List   Diagnosis Date Noted   Unilateral primary osteoarthritis, left knee 12/27/2020   Status post revision of total replacement of right knee 05/07/2020   Failed total knee, right, 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/2020   History of total right knee replacement 01/28/2020  Vitamin D deficiency 08/29/2018   Insulin resistance 08/29/2018   Class 3 severe obesity with serious comorbidity and body mass index (BMI) of 40.0 to 44.9 in adult ([place]) 08/14/2018   Other specified glaucoma 08/14/2018   Incontinence of urine in female 11/07/2017   Vertigo 11/07/2017   Left knee pain 10/25/2016   Genetic testing 08/08/2016   Hypothyroidism 12/02/2015   Anxiety and depression 12/02/2015   OAB (overactive bladder) 12/02/2015   Breast cancer of upper-outer quadrant of left female breast ([place]) 12/02/2015   Obesity (BMI 30-39.9) 12/02/2015   Past Medical History:  Diagnosis Date    Anemia    hx of   Anxiety    pt denies   Arthritis    hands and feet   Cancer ([place]) 10/04/2006   breast   Cataracts, both eyes    Depression    Depression    Diverticulosis of colon    Glaucoma    Heart murmur    No work up done 5 years ago   Joint pain    Knee pain    Obesity    OSA on CPAP    Osteoarthritis    Sleep apnea    no CPAP- no longer needed d/t weight loss   Thyroid activity decreased     Family History  Problem Relation Age of Onset   CVA Mother    Cancer Mother 74       breast cancer    Heart disease Mother    Stroke Mother    Obesity Mother    Leukemia Father    High blood pressure Father    High Cholesterol Father    Heart disease Father    Cancer Maternal Aunt 59       breast cancer    Cancer Maternal Aunt 55       breast cancer   Colon cancer Maternal Aunt    Cancer Cousin 32       breast cancer   Esophageal cancer Neg Hx    Rectal cancer Neg Hx    Stomach cancer Neg Hx     Past Surgical History:  Procedure Laterality Date   BREAST SURGERY Bilateral    CATARACT EXTRACTION     COLONOSCOPY     GLAUCOMA REPAIR     MASTECTOMY, RADICAL Bilateral    neulasta induced sterile abscesses     THYROIDECTOMY, PARTIAL     TONSILLECTOMY     TOTAL KNEE ARTHROPLASTY Right    TOTAL KNEE REVISION Right 05/07/2020   Procedure: RIGHT TOTAL KNEE REVISION ARTHROPLASTY;  Surgeon: Mcarthur Rossetti, MD;  Location: [facility];  Service: Orthopedics;  Laterality: Right;   Social History   Occupational History   Occupation: Retired    Comment: Pharmacist, hospital  Tobacco Use   Smoking status: Former    Types: Cigarettes    Quit date: 11/03/1978    Years since quitting: 42.7   Smokeless tobacco: Never  Vaping Use   Vaping Use: Never used  Substance and Sexual Activity   Alcohol use: No   Drug use: No   Sexual activity: Not Currently    Birth control/protection: Post-menopausal

## 2021-07-09 DIAGNOSIS — U071 COVID-19: Secondary | ICD-10-CM | POA: Diagnosis not present

## 2021-07-21 ENCOUNTER — Other Ambulatory Visit: Payer: Self-pay | Admitting: Family Medicine

## 2021-07-21 DIAGNOSIS — E038 Other specified hypothyroidism: Secondary | ICD-10-CM

## 2021-07-26 ENCOUNTER — Other Ambulatory Visit: Payer: Self-pay | Admitting: Nurse Practitioner

## 2021-07-26 DIAGNOSIS — Z170 Estrogen receptor positive status [ER+]: Secondary | ICD-10-CM

## 2021-07-26 DIAGNOSIS — C50412 Malignant neoplasm of upper-outer quadrant of left female breast: Secondary | ICD-10-CM

## 2021-07-26 NOTE — Progress Notes (Signed)
[place]   [phone] Fax:(336) [phone]   Clinic Follow up Note   Patient Care Team: Vanessa Minium, MD as PCP - General (Family Medicine) Vanessa Apo, MD as Consulting Physician (Ophthalmology) Vanessa Merle, MD as Consulting Physician (Hematology) [place] (Dentistry) Vanessa Feeling, NP as Nurse Practitioner (Oncology) Vanessa Hams, MD as Consulting Physician (Family Medicine) Vanessa Hughs, MD as Consulting Physician (Urology) Vanessa Lopes, DO as Consulting Physician ([place]) Vanessa Christensen, [facility] as Pharmacist (Pharmacist) 07/28/2021  CHIEF COMPLAINT: Follow-up left breast cancer  SUMMARY OF ONCOLOGIC HISTORY: Oncology History Overview Note  Breast cancer of upper-outer quadrant of left female breast [facility])   Staging form: Breast, AJCC 7th Edition   - Pathologic stage from 10/31/2006: Stage IIB (T2, N42m, cM0) - Signed by YTruitt Merle MD on 01/11/2016    [place] upper[place]outer quadrant [place]  09/25/2006 Initial Biopsy   Left breast 2:00 position mass core needle biopsy showed invasive carcinoma with overlapping features of lobular and ductal carcinoma, grade 2.   09/25/2006 Receptors her2   ER 3+ positive, PR 3+ positive, HER-2 equivocal on IHC, [facility] of [facility].   09/25/2006 Initial Diagnosis   [place]   10/01/2006 Miscellaneous   BRCA1 and BRCA2 gene sequencing was negative for mutation.   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   10/2006 - 10/2016 Anti-estrogen oral therapy   Adjuvant anastrozole 15mdaily    01/10/2007 -  08/08/2007 Chemotherapy   Adriamycin 120101mnd Cytoxan 1200m39mery 4 weeks for 4 cycles, followed by docetaxel 150mg45m 2 cycles.      09/09/2008 Imaging   PET scan showed no evidence of recurrence or metastatic breast cancer.   01/19/2016 Imaging   Bone Density Scan The BMD measured at Femur Neck Right is 0.973 g/cm2 with a T-score of -0.5.   Genetic testing  08/08/2016 Initial Diagnosis   Genetic testing was negative for pathogenic variants in the 80 genes on the Multi-Gene Panel offered by [facility], included sequencing and/or deletion duplication testing of the following genes: ALK, APC, ATM, AXIN2,BAP1,  BARD1, BLM, BMPR1A, BRCA1, BRCA2, BRIP1, CASR, CDC73, CDH1, CDK4, CDKN1B, CDKN1C, CDKN2A (p14ARF), CDKN2A (p16INK4a), CEBPA, CHEK2, DICER1, CIS3L2, EGFR (c.2369C>T, p.Thr790Met variant only), EPCAM (Deletion/duplication testing only), FH, FLCN, GATA2, GPC3, GREM1 (Promoter region deletion/duplication testing only), HOXB13 (c.251G>A, p.Gly84Glu), HRAS, KIT, MAX, MEN1, MET, MITF (c.952G>A, p.Glu318Lys variant only), MLH1, MSH2, MSH6, MUTYH, NBN, NF1, NF2, PALB2, PDGFRA, PHOX2B, PMS2, POLD1, POLE, POT1, PRKAR1A, PTCH1, PTEN, RAD50, RAD51C, RAD51D, RB1, RECQL4, RET, RUNX1, SDHAF2, SDHA (sequence changes only), SDHB, SDHC, SDHD, SMAD4, SMARCA4, SMARCB1, SMARCE1, STK11, SUFU, TERT, TERT, TMEM127, TP53, TSC1, TSC2, VHL, WRN and WT1. Date of test report is 08/04/16.  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 No medical decisions should be made based on this result at this time given its unknown clinical significance.      CURRENT THERAPY: Surveillance  INTERVAL HISTORY: [name] for follow-up as scheduled, last seen by Dr. Feng Burr Medico/2022.  She feels well in general without major changes in her health.  She has lost weight  intentionally.  She had COVID in June, only symptom was loss of taste and smell.  She recovered well.  She is planning to have a left knee  replacement this year.  She has low energy but continues going to the gym 4-5 times per week, pulled a muscle in her neck last Thursday.  Denies any other bone pain.  A month or so ago she developed pain in the left chest tissue at the mastectomy scar which starts at the side and radiates inward.  Pain is worse when leaning forward she feels a pulsating sensation. Does not recall having postop pain before. Denies new lump or mass, redness, warmth, fever, or chills.  Denies any cough, chest pain, dyspnea.  Saw PCP yesterday, notes she is "always a little anemic."   MEDICAL HISTORY:  Past Medical History:  Diagnosis Date   Anemia    hx of   Anxiety    pt denies   Arthritis    hands and feet   Cancer ([place]) 10/04/2006   breast   Cataracts, both eyes    Depression    Depression    Diverticulosis of colon    Glaucoma    Heart murmur    No work up done 5 years ago   Joint pain    Knee pain    Obesity    OSA on CPAP    Osteoarthritis    Sleep apnea    no CPAP- no longer needed d/t weight loss   Thyroid activity decreased     SURGICAL HISTORY: Past Surgical History:  Procedure Laterality Date   BREAST SURGERY Bilateral    CATARACT EXTRACTION     COLONOSCOPY     GLAUCOMA REPAIR     MASTECTOMY, RADICAL Bilateral    neulasta induced sterile abscesses     THYROIDECTOMY, PARTIAL     TONSILLECTOMY     TOTAL KNEE ARTHROPLASTY Right    TOTAL KNEE REVISION Right 05/07/2020   Procedure: RIGHT TOTAL KNEE REVISION ARTHROPLASTY;  Surgeon: Mcarthur Rossetti, MD;  Location: [facility];  Service: Orthopedics;  Laterality: Right;    I have reviewed the social history and family history with the patient and they are unchanged from previous note.  ALLERGIES:  is allergic to neulasta [pegfilgrastim].  MEDICATIONS:  Current Outpatient Medications  Medication Sig Dispense Refill   acetaminophen (TYLENOL) 500 MG tablet Take 1,000 mg by mouth every 6 (six) hours as needed (pain).     Apoaequorin  (PREVAGEN PO) Take 1 tablet by mouth daily.      Ascorbic Acid (VITAMIN C PO) Take 1 tablet by mouth daily.     aspirin EC 81 MG tablet Take 81 mg by mouth daily. Swallow whole.     B Complex-C (SUPER B COMPLEX PO) Take 1 tablet by mouth daily after supper.     FLUoxetine (PROZAC) 40 MG capsule TAKE 1 CAPSULE (40 MG TOTAL) BY MOUTH DAILY. 90 capsule 3   levothyroxine (SYNTHROID) 75 MCG tablet TAKE 1 TABLET BY MOUTH EVERY DAY 90 tablet 1   meclizine (ANTIVERT) 25 MG tablet Take 25 mg by mouth 3 (three) times daily as needed for dizziness.     meloxicam (MOBIC) 15 MG tablet Take 1 tablet (15 mg total) by mouth daily. 30 tablet 0   Omega-3 Fatty Acids (FISH OIL ADULT GUMMIES) 113.5 MG CHEW Chew 2 tablets by mouth daily at 2 PM.     psyllium (METAMUCIL) 58.6 % powder Take 1 packet by mouth daily.  solifenacin (VESICARE) 5 MG tablet Take 1 tablet (5 mg total) by mouth daily. 90 tablet 0   tiZANidine (ZANAFLEX) 4 MG tablet Take 1 tablet (4 mg total) by mouth every 8 (eight) hours as needed for muscle spasms. 45 tablet 0   traZODone (DESYREL) 50 MG tablet TAKE 1/2 TO 1 TABLET BY MOUTH AT BEDTIME AS NEEDED FOR SLEEP 90 tablet 2   No current facility-administered medications for this visit.    PHYSICAL EXAMINATION: ECOG PERFORMANCE STATUS: 0 - Asymptomatic  Vitals:   07/28/21 0939  BP: (!) 175/65  Pulse: 68  Resp: 19  Temp: 97.9 F (36.6 C)  SpO2: 98%   Filed Weights   07/28/21 0939  Weight: 200 lb 3.2 oz (90.8 kg)    GENERAL:alert, no distress and comfortable SKIN: no rash  EYES: sclera clear NECK: without mass LYMPH:  no palpable cervical or supraclavicular lymphadenopathy  LUNGS: clear with normal breathing effort HEART: regular rate & rhythm, no lower extremity edema ABDOMEN:abdomen soft, non-tender and normal bowel sounds Musculoskeletal: focal tenderness at the low C spine NEURO: alert & oriented x 3 with fluent speech, no focal motor/sensory deficits Breast exam: s/p  bilateral mastectomy, incisions completely healed with minimal scar tissue. Prominent soft tissue bilaterally. No erythema, or warmth. Mild edema and TTP at the left medial prominence. No discrete mass or nodularity along the incisions/chest wall or either axilla that I could appreciate.   LABORATORY DATA:  I have reviewed the data as listed CBC Latest Ref Rng & Units 07/28/2021 07/27/2021 07/30/2020  WBC 4.0 - 10.5 K/uL 2.6(L) 3.7(L) 3.0(L)  Hemoglobin 12.0 - 15.0 g/dL 11.6(L) 12.1 11.6(L)  Hematocrit 36.0 - 46.0 % 36.4 36.1 37.1  Platelets 150 - 400 K/uL 152 172.0 180     CMP Latest Ref Rng & Units 07/28/2021 07/27/2021 12/08/2020  Glucose 70 - 99 mg/dL 87 73 79  BUN 8 - 23 mg/dL 18 25(H) 21  Creatinine 0.44 - 1.00 mg/dL 0.67 0.73 0.63  Sodium 135 - 145 mmol/L 139 139 141  Potassium 3.5 - 5.1 mmol/L 4.1 4.2 4.7  Chloride 98 - 111 mmol/L 107 104 104  CO2 22 - 32 mmol/L 26 25 24   Calcium 8.9 - 10.3 mg/dL 9.6 9.6 9.4  Total Protein 6.5 - 8.1 g/dL 7.0 6.7 6.8  Total Bilirubin 0.3 - 1.2 mg/dL 0.5 0.6 0.4  Alkaline Phos 38 - 126 U/L 61 63 76  AST 15 - 41 U/L 21 20 20   ALT 0 - 44 U/L 22 21 20       RADIOGRAPHIC STUDIES: I have personally reviewed the radiological images as listed and agreed with the findings in the report. No results found.   ASSESSMENT & PLAN: Dynver Clemson is a 71 y.o. female with    1. 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 S/p b/l breast mastectomy, adjuvant chemo AC, and anti-estrogen therapy with Anastrozole for 10 years. she prepped for bilateral reconstruction but ultimately decided not to proceed, she has prominent chest wall soft tissue from that.  -no role for mammograms given b/l mastectomy -Ms. Croslin is clinically doing well. She has developed pain at the left chest soft tissue. Exam shows mild edema and is tender to palpation, no mass or signs of  cellulitis. I suspect this is post-operative change, but patient is concerned and would like to pursue work up. I have referred her to [name] of soft tissue.  I will call her with results.  -she is 15 years from initial diagnosis, the recurrence risk is minimal. She prefers to continue surveillance with [name]  2. Mild anemia and neutropenia  -she had mild anemia x1 in 05/2017, Hgb 11.8 which normalized to 12 range. She developed recurrent mild anemia in 12/21 lowest Hgb 11.3.  -in 2/22 she was found to have mild leukopenia WBC 3.0 and ANC 1.5 -I did not see that PCP ordered labs 07/27/21, with normal Hgb (12.1) and ANC (1.8); she also had normal TSH, R67, and folic acid -today's CBC at [facility] shows hgb 11.6 and ANC 1.3. This may reflect lab variation.  -Ferritin 29 (normal) and TIBC slightly high at 456. I recommend to start multivitamin with iron -Ms. Tash is clinically doing well, this is likely benign lab change. However, given her history of breast cancer and myeloablative chemo, I think it should be monitored -repeat CBC in 2-3 months   3. Bone health  -normal DEXA on 02/07/19 -repeat in 2023 per PCP   4. Hypothyroidism, depression, obesity -She started participation with [facility] in early 2020, she lost 53 lbs intentionally -continue f/up and healthy lifestyle     5. Genetic Testing was normal and did not reveal a mutation in these genes.    PLAN: -labs reviewed -begin multivitamin with iron -repeat labs in 2-3 months  -[name] left chest soft tissue, attn: left side medial mastectomy scar, I will call with results  -continue surveillance. If [name] is negative, f/up in 1 year  Orders Placed This Encounter  Procedures   Ferritin    Standing Status:   Standing    Number of Occurrences:   1    Standing Expiration Date:   07/28/2022   Iron and Iron Binding Capacity ([facility] only)    Standing Status:   Standing    Number of Occurrences:   1    Standing Expiration  Date:   07/28/2022   All questions were answered. The patient knows to call the clinic with any problems, questions or concerns. No barriers to learning was detected. I spent 20 minutes counseling the patient face to face. The total time spent in the appointment was 30 minutes and more than 50% was on counseling and review of test results     Vanessa Feeling, NP 07/28/21

## 2021-07-27 ENCOUNTER — Encounter: Payer: Self-pay | Admitting: Family Medicine

## 2021-07-27 ENCOUNTER — Ambulatory Visit (INDEPENDENT_AMBULATORY_CARE_PROVIDER_SITE_OTHER): Payer: Medicare Other | Admitting: Family Medicine

## 2021-07-27 VITALS — BP 122/72 | HR 74 | Temp 99.0°F | Resp 16 | Wt 201.4 lb

## 2021-07-27 DIAGNOSIS — R52 Pain, unspecified: Secondary | ICD-10-CM

## 2021-07-27 DIAGNOSIS — E559 Vitamin D deficiency, unspecified: Secondary | ICD-10-CM

## 2021-07-27 DIAGNOSIS — R5383 Other fatigue: Secondary | ICD-10-CM

## 2021-07-27 DIAGNOSIS — R202 Paresthesia of skin: Secondary | ICD-10-CM | POA: Diagnosis not present

## 2021-07-27 MED ORDER — TIZANIDINE HCL 4 MG PO TABS: 4.0000 mg | ORAL_TABLET | Freq: Three times a day (TID) | ORAL | 0 refills | Status: DC | PRN

## 2021-07-27 MED ORDER — MELOXICAM 15 MG PO TABS: 15.0000 mg | ORAL_TABLET | Freq: Every day | ORAL | 0 refills | Status: DC

## 2021-07-27 NOTE — Patient Instructions (Signed)
Follow up as needed or as scheduled We'll notify you of your lab results and make any changes if needed START the Meloxicam once daily for 7-10 days and then as needed for pain/inflammation USE the Tizanidine (muscle relaxer) as needed every 8 hrs HEAT the neck and back Call with any questions or concerns Hang in there!!!

## 2021-07-27 NOTE — Progress Notes (Signed)
Subjective:    Patient ID: Vanessa Christensen, female    DOB: 1950/07/10, 71 y.o.   MRN: [id number]  HPI Body aches- sxs started Thursday (6 days ago).  Initially thought she was sore due to gym exercises.  Initially had severe muscle spasm at base of her neck.  That has improved but now she's having pain that radiates down her neck bilaterally and into shoulders.  Some difficulty moving neck side to side.  Some back pain.  + Fatigue.  Negative COVID test.  Pt reports feeling 'tingly' all over.  No fevers.  Denies cough or congestion.  Denies CP, SOB.   Review of Systems For ROS see HPI   This visit occurred during the SARS-CoV-2 public health emergency.  Safety protocols were in place, including screening questions prior to the visit, additional usage of staff PPE, and extensive cleaning of exam room while observing appropriate contact time as indicated for disinfecting solutions.      Objective:   Physical Exam Vitals reviewed.  Constitutional:      General: She is not in acute distress.    Appearance: Normal appearance. She is obese. She is not ill-appearing.  HENT:     Head: Normocephalic and atraumatic.     Right Ear: Tympanic membrane and external ear normal.     Left Ear: Tympanic membrane and external ear normal.     Nose: No congestion.  Eyes:     Extraocular Movements: Extraocular movements intact.     Conjunctiva/sclera: Conjunctivae normal.     Pupils: Pupils are equal, round, and reactive to light.  Cardiovascular:     Rate and Rhythm: Normal rate and regular rhythm.     Pulses: Normal pulses.     Heart sounds: Normal heart sounds.  Pulmonary:     Effort: Pulmonary effort is normal. No respiratory distress.     Breath sounds: Normal breath sounds. No wheezing or rhonchi.  Abdominal:     General: There is no distension.     Palpations: Abdomen is soft.     Tenderness: There is no abdominal tenderness. There is no guarding or rebound.  Musculoskeletal:        General:  Tenderness (TTP over traps bilaterally) present.     Cervical back: Neck supple.  Lymphadenopathy:     Cervical: No cervical adenopathy.  Skin:    General: Skin is warm and dry.     Findings: No rash.  Neurological:     General: No focal deficit present.     Mental Status: She is alert and oriented to person, place, and time.  Psychiatric:        Mood and Affect: Mood normal.        Behavior: Behavior normal.        Thought Content: Thought content normal.          Assessment & Plan:   Body aches- new.  Pt w/o other sxs of URI or flu.  Unclear if this is due to exertion at the gym or an underlying viral process.  Check labs.  Start daily Meloxicam and use Tizanidine prn.  Pt expressed understanding and is in agreement w/ plan.   Fatigue- new.  Again, unclear if this is due to over-exertion at the gym, stress of her husband's recent MI, or a viral/metabolic process.  Check labs and address any abnormalities if present.  Tingling in extremities- discussed that this could be due to Trap spasm and irritation of nerves that run thru those  muscles.  Can also be anxiety related given husband's recent MI.  Check labs to r/o metabolic causes.  Start Meloxicam and Tizanidine.  Pt expressed understanding and is in agreement w/ plan.   Vit D deficiency- check labs and replete prn.

## 2021-07-28 ENCOUNTER — Inpatient Hospital Stay: Payer: Medicare Other | Attending: Nurse Practitioner | Admitting: Nurse Practitioner

## 2021-07-28 ENCOUNTER — Other Ambulatory Visit: Payer: Self-pay

## 2021-07-28 ENCOUNTER — Inpatient Hospital Stay: Payer: Medicare Other

## 2021-07-28 ENCOUNTER — Encounter: Payer: Self-pay | Admitting: Nurse Practitioner

## 2021-07-28 VITALS — BP 175/65 | HR 68 | Temp 97.9°F | Resp 19 | Ht 62.0 in | Wt 200.2 lb

## 2021-07-28 DIAGNOSIS — E669 Obesity, unspecified: Secondary | ICD-10-CM | POA: Insufficient documentation

## 2021-07-28 DIAGNOSIS — C50412 Malignant neoplasm of upper-outer quadrant of left female breast: Secondary | ICD-10-CM | POA: Diagnosis not present

## 2021-07-28 DIAGNOSIS — Z7982 Long term (current) use of aspirin: Secondary | ICD-10-CM | POA: Diagnosis not present

## 2021-07-28 DIAGNOSIS — Z170 Estrogen receptor positive status [ER+]: Secondary | ICD-10-CM

## 2021-07-28 DIAGNOSIS — Z79899 Other long term (current) drug therapy: Secondary | ICD-10-CM | POA: Insufficient documentation

## 2021-07-28 DIAGNOSIS — D709 Neutropenia, unspecified: Secondary | ICD-10-CM | POA: Insufficient documentation

## 2021-07-28 DIAGNOSIS — N644 Mastodynia: Secondary | ICD-10-CM | POA: Insufficient documentation

## 2021-07-28 DIAGNOSIS — Z9013 Acquired absence of bilateral breasts and nipples: Secondary | ICD-10-CM | POA: Insufficient documentation

## 2021-07-28 DIAGNOSIS — D649 Anemia, unspecified: Secondary | ICD-10-CM | POA: Diagnosis not present

## 2021-07-28 DIAGNOSIS — E039 Hypothyroidism, unspecified: Secondary | ICD-10-CM | POA: Diagnosis not present

## 2021-07-28 DIAGNOSIS — Z853 Personal history of malignant neoplasm of breast: Secondary | ICD-10-CM | POA: Insufficient documentation

## 2021-07-28 DIAGNOSIS — F32A Depression, unspecified: Secondary | ICD-10-CM | POA: Insufficient documentation

## 2021-07-28 LAB — CMP (CANCER CENTER ONLY)
ALT: 22 U/L (ref 0–44)
AST: 21 U/L (ref 15–41)
Albumin: 4.3 g/dL (ref 3.5–5.0)
Alkaline Phosphatase: 61 U/L (ref 38–126)
Anion gap: 6 (ref 5–15)
BUN: 18 mg/dL (ref 8–23)
CO2: 26 mmol/L (ref 22–32)
Calcium: 9.6 mg/dL (ref 8.9–10.3)
Chloride: 107 mmol/L (ref 98–111)
Creatinine: 0.67 mg/dL (ref 0.44–1.00)
GFR, Estimated: >60 mL/min (ref >60)
Glucose, Bld: 87 mg/dL (ref 70–99)
Potassium: 4.1 mmol/L (ref 3.5–5.1)
Sodium: 139 mmol/L (ref 135–145)
Total Bilirubin: 0.5 mg/dL (ref 0.3–1.2)
Total Protein: 7 g/dL (ref 6.5–8.1)

## 2021-07-28 LAB — IRON AND IRON BINDING CAPACITY (CC-WL,HP ONLY)
Iron: 56 ug/dL (ref 28–170)
Saturation Ratios: 12 % (ref 10.4–31.8)
TIBC: 456 ug/dL — ABNORMAL HIGH (ref 250–450)
UIBC: 400 ug/dL (ref 148–442)

## 2021-07-28 LAB — CBC WITH DIFFERENTIAL/PLATELET
Basophils Absolute: 0 10*3/uL (ref 0.0–0.1)
Basophils Relative: 0.6 % (ref 0.0–3.0)
Eosinophils Absolute: 0 10*3/uL (ref 0.0–0.7)
Eosinophils Relative: 0.2 % (ref 0.0–5.0)
HCT: 36.1 % (ref 36.0–46.0)
Hemoglobin: 12.1 g/dL (ref 12.0–15.0)
Lymphocytes Relative: 33.7 % (ref 12.0–46.0)
Lymphs Abs: 1.2 10*3/uL (ref 0.7–4.0)
MCHC: 33.7 g/dL (ref 30.0–36.0)
MCV: 85 fl (ref 78.0–100.0)
Monocytes Absolute: 0.6 10*3/uL (ref 0.1–1.0)
Monocytes Relative: 17.2 % — ABNORMAL HIGH (ref 3.0–12.0)
Neutro Abs: 1.8 10*3/uL (ref 1.4–7.7)
Neutrophils Relative %: 48.3 % (ref 43.0–77.0)
Platelets: 172 10*3/uL (ref 150.0–400.0)
RBC: 4.24 Mil/uL (ref 3.87–5.11)
RDW: 14.3 % (ref 11.5–15.5)
WBC: 3.7 10*3/uL — ABNORMAL LOW (ref 4.0–10.5)

## 2021-07-28 LAB — CBC WITH DIFFERENTIAL (CANCER CENTER ONLY)
Abs Immature Granulocytes: 0.01 10*3/uL (ref 0.00–0.07)
Basophils Absolute: 0 10*3/uL (ref 0.0–0.1)
Basophils Relative: 0 %
Eosinophils Absolute: 0 10*3/uL (ref 0.0–0.5)
Eosinophils Relative: 0 %
HCT: 36.4 % (ref 36.0–46.0)
Hemoglobin: 11.6 g/dL — ABNORMAL LOW (ref 12.0–15.0)
Immature Granulocytes: 0 %
Lymphocytes Relative: 33 %
Lymphs Abs: 0.9 10*3/uL (ref 0.7–4.0)
MCH: 28.3 pg (ref 26.0–34.0)
MCHC: 31.9 g/dL (ref 30.0–36.0)
MCV: 88.8 fL (ref 80.0–100.0)
Monocytes Absolute: 0.5 10*3/uL (ref 0.1–1.0)
Monocytes Relative: 18 %
Neutro Abs: 1.3 10*3/uL — ABNORMAL LOW (ref 1.7–7.7)
Neutrophils Relative %: 49 %
Platelet Count: 152 10*3/uL (ref 150–400)
RBC: 4.1 MIL/uL (ref 3.87–5.11)
RDW: 13.9 % (ref 11.5–15.5)
WBC Count: 2.6 10*3/uL — ABNORMAL LOW (ref 4.0–10.5)
nRBC: 0 % (ref 0.0–0.2)

## 2021-07-28 LAB — BASIC METABOLIC PANEL
BUN: 25 mg/dL — ABNORMAL HIGH (ref 6–23)
CO2: 25 mEq/L (ref 19–32)
Calcium: 9.6 mg/dL (ref 8.4–10.5)
Chloride: 104 mEq/L (ref 96–112)
Creatinine, Ser: 0.73 mg/dL (ref 0.40–1.20)
GFR: 82.01 mL/min (ref >60.00)
Glucose, Bld: 73 mg/dL (ref 70–99)
Potassium: 4.2 mEq/L (ref 3.5–5.1)
Sodium: 139 mEq/L (ref 135–145)

## 2021-07-28 LAB — HEPATIC FUNCTION PANEL
ALT: 21 U/L (ref 0–35)
AST: 20 U/L (ref 0–37)
Albumin: 4.5 g/dL (ref 3.5–5.2)
Alkaline Phosphatase: 63 U/L (ref 39–117)
Bilirubin, Direct: 0.1 mg/dL (ref 0.0–0.3)
Total Bilirubin: 0.6 mg/dL (ref 0.2–1.2)
Total Protein: 6.7 g/dL (ref 6.0–8.3)

## 2021-07-28 LAB — FERRITIN: Ferritin: 29 ng/mL (ref 11–307)

## 2021-07-28 LAB — VITAMIN D 25 HYDROXY (VIT D DEFICIENCY, FRACTURES): VITD: 32.84 ng/mL (ref 30.00–100.00)

## 2021-07-28 LAB — B12 AND FOLATE PANEL
Folate: >24.2 ng/mL (ref >5.9)
Vitamin B-12: 804 pg/mL (ref 211–911)

## 2021-07-28 LAB — CK: Total CK: 51 U/L (ref 7–177)

## 2021-07-28 LAB — TSH: TSH: 0.79 u[IU]/mL (ref 0.35–5.50)

## 2021-07-29 ENCOUNTER — Telehealth: Payer: Self-pay

## 2021-07-29 NOTE — Telephone Encounter (Signed)
Pt aware, she also states that she is feeling better

## 2021-07-29 NOTE — Telephone Encounter (Signed)
-----   Message from [name], MD sent at 07/28/2021 11:02 AM EST ----- Labs all look good!  The mildly suppressed WBC (white blood cell count) and increased monocyte count are consistent w/ viral illness.  Everything else looks great!  Please keep me updated on how you are feeling

## 2021-08-01 ENCOUNTER — Ambulatory Visit (INDEPENDENT_AMBULATORY_CARE_PROVIDER_SITE_OTHER): Payer: Medicare Other | Admitting: Bariatrics

## 2021-08-01 ENCOUNTER — Encounter (INDEPENDENT_AMBULATORY_CARE_PROVIDER_SITE_OTHER): Payer: Self-pay | Admitting: Bariatrics

## 2021-08-01 ENCOUNTER — Other Ambulatory Visit: Payer: Self-pay

## 2021-08-01 VITALS — BP 148/77 | HR 69 | Temp 98.1°F | Ht 62.0 in | Wt 196.0 lb

## 2021-08-01 DIAGNOSIS — E038 Other specified hypothyroidism: Secondary | ICD-10-CM

## 2021-08-01 DIAGNOSIS — Z6841 Body Mass Index (BMI) 40.0 and over, adult: Secondary | ICD-10-CM

## 2021-08-01 DIAGNOSIS — Z6836 Body mass index (BMI) 36.0-36.9, adult: Secondary | ICD-10-CM

## 2021-08-01 DIAGNOSIS — E559 Vitamin D deficiency, unspecified: Secondary | ICD-10-CM | POA: Diagnosis not present

## 2021-08-01 DIAGNOSIS — E669 Obesity, unspecified: Secondary | ICD-10-CM

## 2021-08-01 NOTE — Progress Notes (Signed)
Chief Complaint:   OBESITY Vanessa Christensen is here to discuss her progress with her obesity treatment plan along with follow-up of her obesity related diagnoses. Vanessa Christensen is on the Category 4 Plan and states she is following her eating plan approximately 50% of the time. Vanessa Christensen states she is doing cardio and weights for 60 minutes 2-4 times per week.  Today's visit was #: 77 Starting weight: 241 lbs Starting date: 08/12/2020 Today's weight: 196 lbs Today's date: 08/01/2021 Total lbs lost to date: 45 lbs Total lbs lost since last in-office visit: 1 lb  Interim History: Vanessa Christensen is down 1 additional pound.   Subjective:   1. Other specified hypothyroidism Vanessa Christensen is currently taking Synthroid.   2. Vitamin D deficiency Vanessa Christensen is taking Vitamin D currently.   Assessment/Plan:   1. Other specified hypothyroidism Vanessa Christensen will continue taking Synthroid. Orders and follow up as documented in patient record.  Counseling Good thyroid control is important for overall health. Supratherapeutic thyroid levels are dangerous and will not improve weight loss results. Counseling: The correct way to take levothyroxine is fasting, with water, separated by at least 30 minutes from breakfast, and separated by more than 4 hours from calcium, iron, multivitamins, acid reflux medications (PPIs).    2. Vitamin D deficiency Low Vitamin D level contributes to fatigue and are associated with obesity, breast, and colon cancer. Vanessa Christensen agrees to continue to take prescription Vitamin D  and she will follow-up for routine testing of Vitamin D, at least 2-3 times per year to avoid over-replacement.  3. Obesity, current BMI 36.0 Vanessa Christensen is currently in the action stage of change. As such, her goal is to continue with weight loss efforts. She has agreed to the Category 4 Plan and keeping a food journal and adhering to recommended goals of 1600-1700 calories and 80-90 grams of protein.   Vanessa Christensen will adhere to the plan  80-90%/ She will count calories and protein.   Exercise goals:  Vanessa Christensen notes pain in her neck and she is exercising less.   Behavioral modification strategies: increasing lean protein intake, decreasing simple carbohydrates, increasing vegetables, increasing water intake, decreasing eating out, no skipping meals, meal planning and cooking strategies, keeping healthy foods in the home, and planning for success.  Vanessa Christensen has agreed to follow-up with our clinic in 4 weeks. She was informed of the importance of frequent follow-up visits to maximize her success with intensive lifestyle modifications for her multiple health conditions.   Objective:   Blood pressure (!) 148/77, pulse 69, temperature 98.1 F (36.7 C), height 5\' 2"  (1.575 m), weight 196 lb (88.9 kg), SpO2 (!) 86 %. Body mass index is 35.85 kg/m.  General: Cooperative, alert, well developed, in no acute distress. HEENT: Conjunctivae and lids unremarkable. Cardiovascular: Regular rhythm.  Lungs: Normal work of breathing. Neurologic: No focal deficits.   Lab Results  Component Value Date   CREATININE 0.67 07/28/2021   BUN 18 07/28/2021   NA 139 07/28/2021   K 4.1 07/28/2021   CL 107 07/28/2021   CO2 26 07/28/2021   Lab Results  Component Value Date   ALT 22 07/28/2021   AST 21 07/28/2021   ALKPHOS 61 07/28/2021   BILITOT 0.5 07/28/2021   Lab Results  Component Value Date   HGBA1C 5.2 12/08/2020   HGBA1C 5.6 01/15/2020   HGBA1C 5.4 11/04/2018   HGBA1C 5.6 08/13/2018   Lab Results  Component Value Date   INSULIN 4.7 12/08/2020   INSULIN 8.0 01/15/2020  INSULIN 10.9 11/04/2018   INSULIN 13.2 08/13/2018   Lab Results  Component Value Date   TSH 0.79 07/27/2021   Lab Results  Component Value Date   CHOL 189 12/08/2020   HDL 47 12/08/2020   LDLCALC 127 (H) 12/08/2020   TRIG 80 12/08/2020   CHOLHDL 4 05/08/2018   Lab Results  Component Value Date   VD25OH 32.84 07/27/2021   VD25OH 74.7 12/08/2020    VD25OH 36.9 01/15/2020   Lab Results  Component Value Date   WBC 2.6 (L) 07/28/2021   HGB 11.6 (L) 07/28/2021   HCT 36.4 07/28/2021   MCV 88.8 07/28/2021   PLT 152 07/28/2021   Lab Results  Component Value Date   IRON 56 07/28/2021   TIBC 456 (H) 07/28/2021   FERRITIN 29 07/28/2021   Attestation Statements:   Reviewed by clinician on day of visit: allergies, medications, problem list, medical history, surgical history, family history, social history, and previous encounter notes.  I, Lizbeth Bark, RMA, am acting as Location manager for [employer], DO.  I have reviewed the above documentation for accuracy and completeness, and I agree with the above. Jearld Lesch, DO

## 2021-08-02 ENCOUNTER — Telehealth: Payer: Self-pay | Admitting: Hematology

## 2021-08-02 NOTE — Telephone Encounter (Signed)
Left message with follow-up appointments per 2/23 los.

## 2021-08-04 ENCOUNTER — Other Ambulatory Visit: Payer: Self-pay | Admitting: Family Medicine

## 2021-08-18 ENCOUNTER — Other Ambulatory Visit: Payer: Self-pay | Admitting: Nurse Practitioner

## 2021-08-29 ENCOUNTER — Other Ambulatory Visit: Payer: Self-pay

## 2021-08-29 ENCOUNTER — Encounter (INDEPENDENT_AMBULATORY_CARE_PROVIDER_SITE_OTHER): Payer: Self-pay | Admitting: Bariatrics

## 2021-08-29 ENCOUNTER — Ambulatory Visit (INDEPENDENT_AMBULATORY_CARE_PROVIDER_SITE_OTHER): Payer: Medicare Other | Admitting: Bariatrics

## 2021-08-29 VITALS — BP 146/80 | HR 70 | Temp 98.2°F | Ht 62.0 in | Wt 194.0 lb

## 2021-08-29 DIAGNOSIS — E669 Obesity, unspecified: Secondary | ICD-10-CM

## 2021-08-29 DIAGNOSIS — E039 Hypothyroidism, unspecified: Secondary | ICD-10-CM

## 2021-08-29 DIAGNOSIS — E66813 Obesity, class 3: Secondary | ICD-10-CM

## 2021-08-29 DIAGNOSIS — N393 Stress incontinence (female) (male): Secondary | ICD-10-CM | POA: Diagnosis not present

## 2021-08-29 DIAGNOSIS — Z6835 Body mass index (BMI) 35.0-35.9, adult: Secondary | ICD-10-CM | POA: Diagnosis not present

## 2021-08-29 MED ORDER — SOLIFENACIN SUCCINATE 5 MG PO TABS: 5.0000 mg | ORAL_TABLET | Freq: Every day | ORAL | 0 refills | Status: DC

## 2021-08-30 ENCOUNTER — Encounter (INDEPENDENT_AMBULATORY_CARE_PROVIDER_SITE_OTHER): Payer: Self-pay | Admitting: Bariatrics

## 2021-08-30 NOTE — Progress Notes (Signed)
? ? ? ?Chief Complaint:  ? ?OBESITY ?Vanessa Christensen is here to discuss her progress with her obesity treatment plan along with follow-up of her obesity related diagnoses. Vanessa Christensen is on the Category 4 Plan and states she is following her eating plan approximately 50% of the time. Vanessa Christensen states she is doing cardio and strength training for 60 minutes 4-5 times per week. ? ?Today's visit was #: 25 ?Starting weight: 241 lbs ?Starting date: 08/12/2020 ?Today's weight: 194 lbs ?Today's date: 08/29/2021 ?Total lbs lost to date: 47 lbs ?Total lbs lost since last in-office visit: 2 lbs ? ?Interim History: Vanessa Christensen is down an additional 2 lbs since her last visit.  ? ?Subjective:  ? ?1. Stress incontinence ?Vanessa Christensen is currently taking Vesicare. Her stress incontinence is improving.  ? ?2. Hypothyroidism, unspecified type ?Vanessa Christensen is taking Synthroid currently.  ? ?Assessment/Plan:  ? ?1. Stress incontinence ?We will refill Vesicare 5 mg for 3 months with no refills . ? ?- solifenacin (VESICARE) 5 MG tablet; Take 1 tablet (5 mg total) by mouth daily.  Dispense: 90 tablet; Refill: 0 ? ?2. Hypothyroidism, unspecified type ?Vanessa Christensen will continue taking Synthroid. Orders and follow up as documented in patient record. ? ?Counseling ?Good thyroid control is important for overall health. Supratherapeutic thyroid levels are dangerous and will not improve weight loss results. ?Counseling: The correct way to take levothyroxine is fasting, with water, separated by at least 30 minutes from breakfast, and separated by more than 4 hours from calcium, iron, multivitamins, acid reflux medications (PPIs).   ? ?3. Obesity, current BMI 35.6 ?Vanessa Christensen is currently in the action stage of change. As such, her goal is to continue with weight loss efforts. She has agreed to the Category 4 Plan and keeping a food journal and adhering to recommended goals of 1700-1800 calories and 100 grams of protein.  ? ?Vanessa Christensen will adhere closely to the plan 80-90%. ? ?Exercise  goals:  Vanessa Christensen is back to regular exercise.  ? ?Behavioral modification strategies: increasing lean protein intake, decreasing simple carbohydrates, increasing vegetables, increasing water intake, decreasing eating out, no skipping meals, meal planning and cooking strategies, keeping healthy foods in the home, and planning for success. ? ?Vanessa Christensen has agreed to follow-up with our clinic in 4 weeks. She was informed of the importance of frequent follow-up visits to maximize her success with intensive lifestyle modifications for her multiple health conditions.  ? ?Objective:  ? ?Blood pressure (!) 146/80, pulse 70, temperature 98.2 ?F (36.8 ?C), height '5\' 2"'$  (1.575 m), weight 194 lb (88 kg), SpO2 99 %. ?Body mass index is 35.48 kg/m?. ? ?General: Cooperative, alert, well developed, in no acute distress. ?HEENT: Conjunctivae and lids unremarkable. ?Cardiovascular: Regular rhythm.  ?Lungs: Normal work of breathing. ?Neurologic: No focal deficits.  ? ?Lab Results  ?Component Value Date  ? CREATININE 0.67 07/28/2021  ? BUN 18 07/28/2021  ? NA 139 07/28/2021  ? K 4.1 07/28/2021  ? CL 107 07/28/2021  ? CO2 26 07/28/2021  ? ?Lab Results  ?Component Value Date  ? ALT 22 07/28/2021  ? AST 21 07/28/2021  ? ALKPHOS 61 07/28/2021  ? BILITOT 0.5 07/28/2021  ? ?Lab Results  ?Component Value Date  ? HGBA1C 5.2 12/08/2020  ? HGBA1C 5.6 01/15/2020  ? HGBA1C 5.4 11/04/2018  ? HGBA1C 5.6 08/13/2018  ? ?Lab Results  ?Component Value Date  ? INSULIN 4.7 12/08/2020  ? INSULIN 8.0 01/15/2020  ? INSULIN 10.9 11/04/2018  ? INSULIN 13.2 08/13/2018  ? ?Lab Results  ?Component  Value Date  ? TSH 0.79 07/27/2021  ? ?Lab Results  ?Component Value Date  ? CHOL 189 12/08/2020  ? HDL 47 12/08/2020  ? LDLCALC 127 (H) 12/08/2020  ? TRIG 80 12/08/2020  ? CHOLHDL 4 05/08/2018  ? ?Lab Results  ?Component Value Date  ? VD25OH 32.84 07/27/2021  ? VD25OH 74.7 12/08/2020  ? VD25OH 36.9 01/15/2020  ? ?Lab Results  ?Component Value Date  ? WBC 2.6 (L) 07/28/2021   ? HGB 11.6 (L) 07/28/2021  ? HCT 36.4 07/28/2021  ? MCV 88.8 07/28/2021  ? PLT 152 07/28/2021  ? ?Lab Results  ?Component Value Date  ? IRON 56 07/28/2021  ? TIBC 456 (H) 07/28/2021  ? FERRITIN 29 07/28/2021  ? ?Attestation Statements:  ? ?Reviewed by clinician on day of visit: allergies, medications, problem list, medical history, surgical history, family history, social history, and previous encounter notes. ? ?I, Lizbeth Bark, RMA, am acting as transcriptionist for [employer], DO. ? ?I have reviewed the above documentation for accuracy and completeness, and I agree with the above. Jearld Lesch, DO ? ?

## 2021-09-06 ENCOUNTER — Other Ambulatory Visit: Payer: Self-pay | Admitting: Nurse Practitioner

## 2021-09-06 ENCOUNTER — Ambulatory Visit
Admission: RE | Admit: 2021-09-06 | Discharge: 2021-09-06 | Disposition: A | Discharge status: Home / Self Care | Payer: Medicare Other | Source: Ambulatory Visit | Attending: Nurse Practitioner | Admitting: Nurse Practitioner

## 2021-09-06 DIAGNOSIS — C50412 Malignant neoplasm of upper-outer quadrant of left female breast: Secondary | ICD-10-CM

## 2021-09-06 DIAGNOSIS — N6002 Solitary cyst of left breast: Secondary | ICD-10-CM | POA: Diagnosis not present

## 2021-09-06 DIAGNOSIS — N6489 Other specified disorders of breast: Secondary | ICD-10-CM | POA: Diagnosis not present

## 2021-09-06 DIAGNOSIS — R928 Other abnormal and inconclusive findings on diagnostic imaging of breast: Secondary | ICD-10-CM | POA: Diagnosis not present

## 2021-09-08 ENCOUNTER — Other Ambulatory Visit: Payer: Medicare Other

## 2021-09-19 ENCOUNTER — Encounter: Payer: Self-pay | Admitting: Physician Assistant

## 2021-09-19 ENCOUNTER — Ambulatory Visit (INDEPENDENT_AMBULATORY_CARE_PROVIDER_SITE_OTHER): Payer: Medicare Other | Admitting: Physician Assistant

## 2021-09-19 VITALS — Ht 62.0 in | Wt 199.8 lb

## 2021-09-19 DIAGNOSIS — M1712 Unilateral primary osteoarthritis, left knee: Secondary | ICD-10-CM | POA: Diagnosis not present

## 2021-09-19 NOTE — Progress Notes (Signed)
HPI: Vanessa Christensen comes in today to discuss her left knee.  She has known tricompartmental osteoarthritis of the left knee.  She was thinking about having knee replacement later next month.  However she has been dealing with her husband and her group ranging from an MI and also is dealing with a 73 year old dog that is declining in health.  She is asking if it would be advisable to wait to have her left knee replaced until later on in the fall.  She is going to the gym working out states overall knee bothers her some but is not to the point that she just feels that she has to have knee replacement immediately. ? ?Review of systems see HPI otherwise negative ? ?Physical exam: General well-developed well-nourished female no acute distress.  Ambulates without any assistive device. ? ?Left knee she has full extension full flexion. ? ?Impression: Left knee tricompartmental osteoarthritis ? ?Plan: Discussed with her today that there would be no harm in waiting for ultimately is up to her when she would like to undergo surgery for her knee.  We will see her back on an as-needed basis.  Questions were encouraged and answered patient and her husband who is present throughout the exam today ?

## 2021-09-20 IMAGING — NM NM BONE 3 PHASE
7 series · 17 of 17 positions shown · non-contrast
Comparison: None.

CLINICAL DATA: Pain. Right knee replacement. Left knee pain for
months.

EXAM:
NUCLEAR MEDICINE 3-PHASE BONE SCAN
TECHNIQUE: Radionuclide angiographic images, immediate static blood pool
images, and 3-hour delayed static images were obtained of the knees
after intravenous injection of radiopharmaceutical.
RADIOPHARMACEUTICALS:  20.3 mCi Mc-PPm MDP IV

[Series 1: flow · 2.07mm/px · 6 of 48 frames shown (1 of 2)]
[frame 5/48]
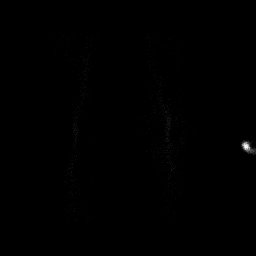
[frame 13/48]
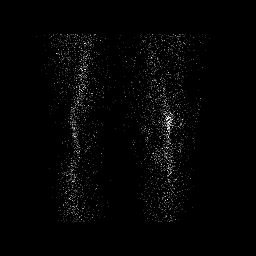
[frame 21/48]
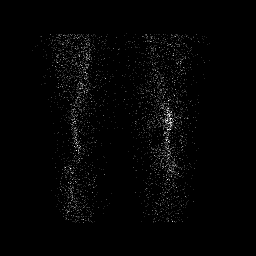
[frame 29/48]
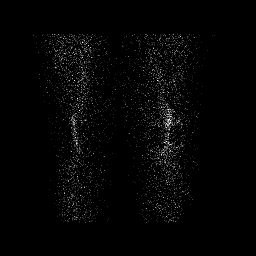
[frame 37/48]
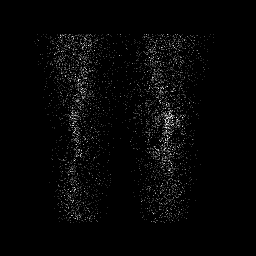
[frame 45/48]
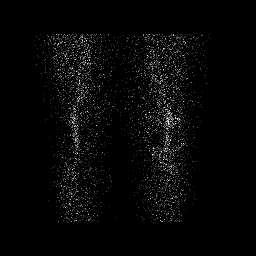

[Series 1: flow · 2.07mm/px · 6 of 48 frames shown (2 of 2)]
[frame 5/48]
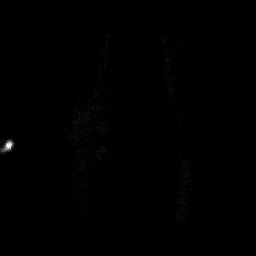
[frame 13/48]
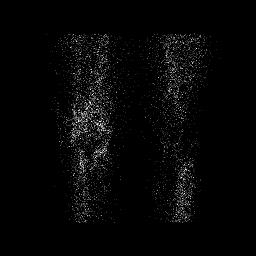
[frame 21/48]
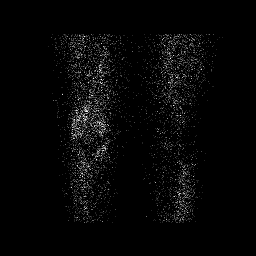
[frame 29/48]
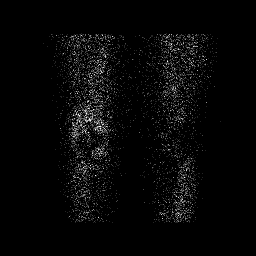
[frame 37/48]
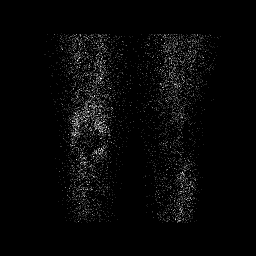
[frame 45/48]
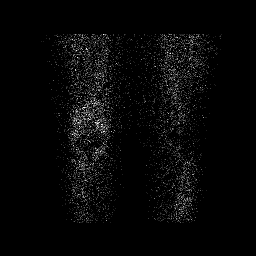

[Series 2: blood pool · 2.07mm/px · 1 of 1 slices shown (1 of 2)]
[im 1/1]
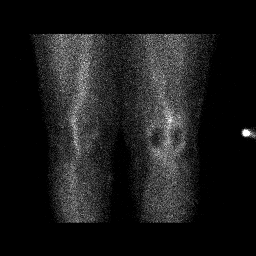

[Series 2: blood pool · 2.07mm/px · 1 of 1 slices shown (2 of 2)]
[im 1/1]
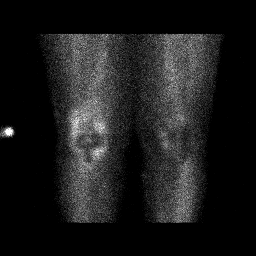

[Series 3: lat bp · 2.07mm/px · 1 of 1 slices shown (1 of 2)]
[im 1/1]
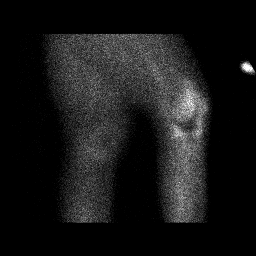

[Series 3: lat bp · 2.07mm/px · 1 of 1 slices shown (2 of 2)]
[im 1/1  full-range]
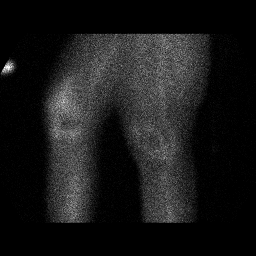

[Series 5: delay · delayed · 2.07mm/px · 1 of 1 slices shown]
[im 1/1]
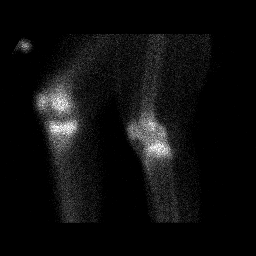

[17 of 17 positions shown; findings below may reference images not displayed]

FINDINGS: Vascular phase: Normal

Blood pool phase: Mild increased uptake around the right knee
replacement. The uptake is most prominent adjacent to the femoral
component in the medial aspect of the tibial component. Mild
increased uptake in the medial compartment of the left knee.

Delayed phase: Focal uptake adjacent to the medial aspect of the
tibial component on the right. Remainder of uptake around the right
knee replacement is within normal limits. Focal uptake in the medial
left knee is likely degenerative.
IMPRESSION: 1. The focal uptake on delayed imaging adjacent to the medial aspect
of the right tibial component is almost certainly associated with
the lucency seen on x-ray imaging. Osteomyelitis or loosening are
not excluded given the x-ray findings and the focal uptake in this
region on today's imaging. However, osteomyelitis would typically
demonstrate focal blood pool and vascular phase uptake which is not
seen on this study.
2. Degenerative changes in the medial compartment of the left knee.

## 2021-10-03 ENCOUNTER — Ambulatory Visit (INDEPENDENT_AMBULATORY_CARE_PROVIDER_SITE_OTHER): Payer: Medicare Other | Admitting: Bariatrics

## 2021-10-06 ENCOUNTER — Ambulatory Visit (INDEPENDENT_AMBULATORY_CARE_PROVIDER_SITE_OTHER): Payer: Medicare Other

## 2021-10-06 ENCOUNTER — Ambulatory Visit: Payer: Medicare Other

## 2021-10-06 DIAGNOSIS — Z0000 Encounter for general adult medical examination without abnormal findings: Secondary | ICD-10-CM

## 2021-10-06 NOTE — Progress Notes (Signed)
? ?Subjective:  ? Vanessa Christensen is a 71 y.o. female who presents for Medicare Annual (Subsequent) preventive examination. ? ? ?I connected with [name] today by telephone and verified that I am speaking with the correct person using two identifiers. ?Location patient: home ?Location provider: work ?Persons participating in the virtual visit: patient, provider. ?  ?I discussed the limitations, risks, security and privacy concerns of performing an evaluation and management service by telephone and the availability of in person appointments. I also discussed with the patient that there may be a patient responsible charge related to this service. The patient expressed understanding and verbally consented to this telephonic visit.  ?  ?Interactive audio and video telecommunications were attempted between this provider and patient, however failed, due to patient having technical difficulties OR patient did not have access to video capability.  We continued and completed visit with audio only. ? ?  ?Review of Systems    ? ?Cardiac Risk Factors include: advanced age (>27mn, >>73women) ? ?   ?Objective:  ?  ?Today's Vitals  ? ?There is no height or weight on file to calculate BMI. ? ? ?  10/06/2021  ?  2:10 PM 07/05/2020  ? 11:20 AM 05/07/2020  ?  7:30 PM 05/05/2020  ?  2:16 PM 07/02/2019  ?  3:18 PM 05/09/2017  ? 10:22 AM 07/13/2016  ? 10:53 AM  ?Advanced Directives  ?Does Patient Have a Medical Advance Directive? Yes Yes Yes Yes Yes Yes Yes  ?Type of [place]amed[place]of [place] will Healthcare Power of [place] will [place] will Living will;Healthcare Power of [place] will  ?Does patient want to make changes to medical advance directive?   No - Patient declined  No - Patient declined    ?Copy of H[place] Chart? No - copy requested Yes -  validated most recent copy scanned in chart (See row information) No - copy requested No - copy requested Yes - validated most recent copy scanned in chart (See row information) Yes Yes  ? ? ?Current Medications (verified) ?Outpatient Encounter Medications as of 10/06/2021  ?Medication Sig  ? acetaminophen (TYLENOL) 500 MG tablet Take 1,000 mg by mouth every 6 (six) hours as needed (pain).  ? Apoaequorin (PREVAGEN PO) Take 1 tablet by mouth daily.   ? Ascorbic Acid (VITAMIN C PO) Take 1 tablet by mouth daily.  ? aspirin EC 81 MG tablet Take 81 mg by mouth daily. Swallow whole.  ? B Complex-C (SUPER B COMPLEX PO) Take 1 tablet by mouth daily after supper.  ? FLUoxetine (PROZAC) 40 MG capsule TAKE 1 CAPSULE (40 MG TOTAL) BY MOUTH DAILY.  ? levothyroxine (SYNTHROID) 75 MCG tablet TAKE 1 TABLET BY MOUTH EVERY DAY  ? meclizine (ANTIVERT) 25 MG tablet Take 25 mg by mouth 3 (three) times daily as needed for dizziness.  ? meloxicam (MOBIC) 15 MG tablet Take 1 tablet (15 mg total) by mouth daily.  ? Omega-3 Fatty Acids (FISH OIL ADULT GUMMIES) 113.5 MG CHEW Chew 2 tablets by mouth daily at 2 PM.  ? psyllium (METAMUCIL) 58.6 % powder Take 1 packet by mouth daily.   ? solifenacin (VESICARE) 5 MG tablet Take 1 tablet (5 mg total) by mouth daily.  ? tiZANidine (ZANAFLEX) 4 MG tablet Take 1 tablet (4 mg total) by mouth every 8 (eight) hours as needed for muscle spasms.  ?  traZODone (DESYREL) 50 MG tablet TAKE 1/2 TO 1 TABLET BY MOUTH AT BEDTIME AS NEEDED FOR SLEEP  ? ?No facility-administered encounter medications on file as of 10/06/2021.  ? ? ?Allergies (verified) ?Neulasta [pegfilgrastim]  ? ?History: ?Past Medical History:  ?Diagnosis Date  ? Anemia   ? hx of  ? Anxiety   ? pt denies  ? Arthritis   ? hands and feet  ? Cancer (Needles) 10/04/2006  ? breast  ? Cataracts, both eyes   ? Depression   ? Depression   ? Diverticulosis of colon   ? Glaucoma   ? Heart murmur   ? No work up done 5 years ago  ? Joint pain   ? Knee pain   ? Obesity    ? OSA on CPAP   ? Osteoarthritis   ? Sleep apnea   ? no CPAP- no longer needed d/t weight loss  ? Thyroid activity decreased   ? ?Past Surgical History:  ?Procedure Laterality Date  ? BREAST SURGERY Bilateral   ? CATARACT EXTRACTION    ? COLONOSCOPY    ? GLAUCOMA REPAIR    ? MASTECTOMY, RADICAL Bilateral   ? neulasta induced sterile abscesses    ? THYROIDECTOMY, PARTIAL    ? TONSILLECTOMY    ? TOTAL KNEE ARTHROPLASTY Right   ? TOTAL KNEE REVISION Right 05/07/2020  ? Procedure: RIGHT TOTAL KNEE REVISION ARTHROPLASTY;  Surgeon: Mcarthur Rossetti, MD;  Location: [facility];  Service: Orthopedics;  Laterality: Right;  ? ?Family History  ?Problem Relation Age of Onset  ? CVA Mother   ? Cancer Mother 66  ?     breast cancer   ? Heart disease Mother   ? Stroke Mother   ? Obesity Mother   ? Leukemia Father   ? High blood pressure Father   ? High Cholesterol Father   ? Heart disease Father   ? Cancer Maternal Aunt 69  ?     breast cancer   ? Cancer Maternal Aunt 60  ?     breast cancer  ? Colon cancer Maternal Aunt   ? Cancer Cousin 96  ?     breast cancer  ? Esophageal cancer Neg Hx   ? Rectal cancer Neg Hx   ? Stomach cancer Neg Hx   ? ?Social History  ? ?Socioeconomic History  ? Marital status: Married  ?  Spouse name: Shanzay Hepworth  ? Number of children: 2  ? Years of education: Not on file  ? Highest education level: Not on file  ?Occupational History  ? 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 Smoking status: Former  ?  Types: Cigarettes  ?  Quit date: 11/03/1978  ?  Years since quitting: 42.9  ? Smokeless tobacco: Never  ?Vaping Use  ? Vaping Use: Never used  ?Substance and Sexual Activity  ? Alcohol use: No  ? Drug use: No  ? Sexual activity: Not Currently  ?  Birth control/protection: Post-menopausal  ?Other Topics Concern  ? Not on file  ?Social History Narrative  ? Enjoys taking care of grandchildren   ? ?Social Determinants of Health  ? ?Financial Resource Strain: Low Risk   ? Difficulty of Paying  Living Expenses: Not hard at all  ?Food Insecurity: No Food Insecurity  ? Worried About Charity fundraiser in the Last Year: Never true  ? Ran Out of Food in the Last Year: Never true  ?Transportation Needs: No  Transportation Needs  ? Lack of Transportation (Medical): No  ? Lack of Transportation (Non-Medical): No  ?Physical Activity: Sufficiently Active  ? Days of Exercise per Week: 4 days  ? Minutes of Exercise per Session: 60 min  ?Stress: No Stress Concern Present  ? Feeling of Stress : Not at all  ?Social Connections: Socially Integrated  ? Frequency of Communication with Friends and Family: Three times a week  ? Frequency of Social Gatherings with Friends and Family: Three times a week  ? Attends Religious Services: More than 4 times per year  ? Active Member of Clubs or Organizations: Yes  ? Attends Archivist Meetings: More than 4 times per year  ? Marital Status: Married  ? ? ?Tobacco Counseling ?Counseling given: Not Answered ? ? ?Clinical Intake: ? ?Pre-visit preparation completed: Yes ? ?Pain : No/denies pain ? ?  ? ?Nutritional Risks: None ?Diabetes: No ? ?How often do you need to have someone help you when you read instructions, pamphlets, or other written materials from your doctor or pharmacy?: 1 - Never ?What is the last grade level you completed in school?: college ? ?Diabetic?no  ? ?Interpreter Needed?: No ? ?Information entered by :: L.Bobbie Valletta,LPn ? ? ?Activities of Daily Living ? ?  10/06/2021  ?  2:14 PM  ?In your present state of health, do you have any difficulty performing the following activities:  ?Hearing? 0  ?Vision? 0  ?Difficulty concentrating or making decisions? 0  ?Walking or climbing stairs? 0  ?Dressing or bathing? 0  ?Doing errands, shopping? 0  ?Preparing Food and eating ? N  ?Using the Toilet? N  ?In the past six months, have you accidently leaked urine? N  ?Do you have problems with loss of bowel control? N  ?Managing your Medications? N  ?Managing your Finances? N   ?Housekeeping or managing your Housekeeping? N  ? ? ?Patient Care Team: ?[name], MD as PCP - General (Family Medicine) ?Katy Apo, MD as Consulting Physician (Ophthalmology) ?Georgana Curio

## 2021-10-06 NOTE — Patient Instructions (Signed)
Vanessa Christensen , ?Thank you for taking time to come for your Medicare Wellness Visit. I appreciate your ongoing commitment to your health goals. Please review the following plan we discussed and let me know if I can assist you in the future.  ? ?Screening recommendations/referrals: ?Colonoscopy: 09/23/2019 ?Mammogram: 09/2021 ?Bone Density: 02/07/2019 ?Recommended yearly ophthalmology/optometry visit for glaucoma screening and checkup ?Recommended yearly dental visit for hygiene and checkup ? ?Vaccinations: ?Influenza vaccine: completed  ?Pneumococcal vaccine: completed  ?Tdap vaccine: 12/01/2013 ?Shingles vaccine: completed    ? ?Advanced directives: yes  ? ?Conditions/risks identified: none  ? ?Next appointment: none  ? ? ?Preventive Care 12 Years and Older, Female ?Preventive care refers to lifestyle choices and visits with your health care provider that can promote health and wellness. ?What does preventive care include? ?A yearly physical exam. This is also called an annual well check. ?Dental exams once or twice a year. ?Routine eye exams. Ask your health care provider how often you should have your eyes checked. ?Personal lifestyle choices, including: ?Daily care of your teeth and gums. ?Regular physical activity. ?Eating a healthy diet. ?Avoiding tobacco and drug use. ?Limiting alcohol use. ?Practicing safe sex. ?Taking low-dose aspirin every day. ?Taking vitamin and mineral supplements as recommended by your health care provider. ?What happens during an annual well check? ?The services and screenings done by your health care provider during your annual well check will depend on your age, overall health, lifestyle risk factors, and family history of disease. ?Counseling  ?Your health care provider may ask you questions about your: ?Alcohol use. ?Tobacco use. ?Drug use. ?Emotional well-being. ?Home and relationship well-being. ?Sexual activity. ?Eating habits. ?History of falls. ?Memory and ability to understand  (cognition). ?Work and work Statistician. ?Reproductive health. ?Screening  ?You may have the following tests or measurements: ?Height, weight, and BMI. ?Blood pressure. ?Lipid and cholesterol levels. These may be checked every 5 years, or more frequently if you are over 73 years old. ?Skin check. ?Lung cancer screening. You may have this screening every year starting at age 42 if you have a 30-pack-year history of smoking and currently smoke or have quit within the past 15 years. ?Fecal occult blood test (FOBT) of the stool. You may have this test every year starting at age 29. ?Flexible sigmoidoscopy or colonoscopy. You may have a sigmoidoscopy every 5 years or a colonoscopy every 10 years starting at age 39. ?Hepatitis C blood test. ?Hepatitis B blood test. ?Sexually transmitted disease (STD) testing. ?Diabetes screening. This is done by checking your blood sugar (glucose) after you have not eaten for a while (fasting). You may have this done every 1-3 years. ?Bone density scan. This is done to screen for osteoporosis. You may have this done starting at age 47. ?Mammogram. This may be done every 1-2 years. Talk to your health care provider about how often you should have regular mammograms. ?Talk with your health care provider about your test results, treatment options, and if necessary, the need for more tests. ?Vaccines  ?Your health care provider may recommend certain vaccines, such as: ?Influenza vaccine. This is recommended every year. ?Tetanus, diphtheria, and acellular pertussis (Tdap, Td) vaccine. You may need a Td booster every 10 years. ?Zoster vaccine. You may need this after age 71. ?Pneumococcal 13-valent conjugate (PCV13) vaccine. One dose is recommended after age 29. ?Pneumococcal polysaccharide (PPSV23) vaccine. One dose is recommended after age 58. ?Talk to your health care provider about which screenings and vaccines you need and how often you  need them. ?This information is not intended to  replace advice given to you by your health care provider. Make sure you discuss any questions you have with your health care provider. ?Document Released: 06/18/2015 Document Revised: 02/09/2016 Document Reviewed: 03/23/2015 ?Elsevier Interactive Patient Education ? 2017 [place]. ? ?Fall Prevention in the Home ?Falls can cause injuries. They can happen to people of all ages. There are many things you can do to make your home safe and to help prevent falls. ?What can I do on the outside of my home? ?Regularly fix the edges of walkways and driveways and fix any cracks. ?Remove anything that might make you trip as you walk through a door, such as a raised step or threshold. ?Trim any bushes or trees on the path to your home. ?Use bright outdoor lighting. ?Clear any walking paths of anything that might make someone trip, such as rocks or tools. ?Regularly check to see if handrails are loose or broken. Make sure that both sides of any steps have handrails. ?Any raised decks and porches should have guardrails on the edges. ?Have any leaves, snow, or ice cleared regularly. ?Use sand or salt on walking paths during winter. ?Clean up any spills in your garage right away. This includes oil or grease spills. ?What can I do in the bathroom? ?Use night lights. ?Install grab bars by the toilet and in the tub and shower. Do not use towel bars as grab bars. ?Use non-skid mats or decals in the tub or shower. ?If you need to sit down in the shower, use a plastic, non-slip stool. ?Keep the floor dry. Clean up any water that spills on the floor as soon as it happens. ?Remove soap buildup in the tub or shower regularly. ?Attach bath mats securely with double-sided non-slip rug tape. ?Do not have throw rugs and other things on the floor that can make you trip. ?What can I do in the bedroom? ?Use night lights. ?Make sure that you have a light by your bed that is easy to reach. ?Do not use any sheets or blankets that are too big for  your bed. They should not hang down onto the floor. ?Have a firm chair that has side arms. You can use this for support while you get dressed. ?Do not have throw rugs and other things on the floor that can make you trip. ?What can I do in the kitchen? ?Clean up any spills right away. ?Avoid walking on wet floors. ?Keep items that you use a lot in easy-to-reach places. ?If you need to reach something above you, use a strong step stool that has a grab bar. ?Keep electrical cords out of the way. ?Do not use floor polish or wax that makes floors slippery. If you must use wax, use non-skid floor wax. ?Do not have throw rugs and other things on the floor that can make you trip. ?What can I do with my stairs? ?Do not leave any items on the stairs. ?Make sure that there are handrails on both sides of the stairs and use them. Fix handrails that are broken or loose. Make sure that handrails are as long as the stairways. ?Check any carpeting to make sure that it is firmly attached to the stairs. Fix any carpet that is loose or worn. ?Avoid having throw rugs at the top or bottom of the stairs. If you do have throw rugs, attach them to the floor with carpet tape. ?Make sure that you have a light switch  at the top of the stairs and the bottom of the stairs. If you do not have them, ask someone to add them for you. ?What else can I do to help prevent falls? ?Wear shoes that: ?Do not have high heels. ?Have rubber bottoms. ?Are comfortable and fit you well. ?Are closed at the toe. Do not wear sandals. ?If you use a stepladder: ?Make sure that it is fully opened. Do not climb a closed stepladder. ?Make sure that both sides of the stepladder are locked into place. ?Ask someone to hold it for you, if possible. ?Clearly mark and make sure that you can see: ?Any grab bars or handrails. ?First and last steps. ?Where the edge of each step is. ?Use tools that help you move around (mobility aids) if they are needed. These  include: ?Canes. ?Walkers. ?Scooters. ?Crutches. ?Turn on the lights when you go into a dark area. Replace any light bulbs as soon as they burn out. ?Set up your furniture so you have a clear path. Avoid moving your furniture

## 2021-10-20 DIAGNOSIS — H401131 Primary open-angle glaucoma, bilateral, mild stage: Secondary | ICD-10-CM | POA: Diagnosis not present

## 2021-10-25 ENCOUNTER — Inpatient Hospital Stay: Payer: Medicare Other | Attending: Nurse Practitioner

## 2021-10-25 DIAGNOSIS — E538 Deficiency of other specified B group vitamins: Secondary | ICD-10-CM | POA: Insufficient documentation

## 2021-10-25 DIAGNOSIS — Z853 Personal history of malignant neoplasm of breast: Secondary | ICD-10-CM | POA: Insufficient documentation

## 2021-10-25 DIAGNOSIS — Z79899 Other long term (current) drug therapy: Secondary | ICD-10-CM | POA: Insufficient documentation

## 2021-10-25 DIAGNOSIS — Z170 Estrogen receptor positive status [ER+]: Secondary | ICD-10-CM

## 2021-10-25 DIAGNOSIS — Z9221 Personal history of antineoplastic chemotherapy: Secondary | ICD-10-CM | POA: Diagnosis not present

## 2021-10-25 DIAGNOSIS — Z923 Personal history of irradiation: Secondary | ICD-10-CM | POA: Insufficient documentation

## 2021-10-25 DIAGNOSIS — F32A Depression, unspecified: Secondary | ICD-10-CM | POA: Diagnosis not present

## 2021-10-25 DIAGNOSIS — D709 Neutropenia, unspecified: Secondary | ICD-10-CM | POA: Insufficient documentation

## 2021-10-25 DIAGNOSIS — D649 Anemia, unspecified: Secondary | ICD-10-CM | POA: Diagnosis not present

## 2021-10-25 LAB — CBC WITH DIFFERENTIAL (CANCER CENTER ONLY)
Abs Immature Granulocytes: 0.01 10*3/uL (ref 0.00–0.07)
Basophils Absolute: 0 10*3/uL (ref 0.0–0.1)
Basophils Relative: 0 %
Eosinophils Absolute: 0 10*3/uL (ref 0.0–0.5)
Eosinophils Relative: 0 %
HCT: 37.2 % (ref 36.0–46.0)
Hemoglobin: 12.2 g/dL (ref 12.0–15.0)
Immature Granulocytes: 0 %
Lymphocytes Relative: 35 %
Lymphs Abs: 1.7 10*3/uL (ref 0.7–4.0)
MCH: 28.9 pg (ref 26.0–34.0)
MCHC: 32.8 g/dL (ref 30.0–36.0)
MCV: 88.2 fL (ref 80.0–100.0)
Monocytes Absolute: 0.8 10*3/uL (ref 0.1–1.0)
Monocytes Relative: 16 %
Neutro Abs: 2.4 10*3/uL (ref 1.7–7.7)
Neutrophils Relative %: 49 %
Platelet Count: 164 10*3/uL (ref 150–400)
RBC: 4.22 MIL/uL (ref 3.87–5.11)
RDW: 14.1 % (ref 11.5–15.5)
WBC Count: 4.9 10*3/uL (ref 4.0–10.5)
nRBC: 0 % (ref 0.0–0.2)

## 2021-10-26 LAB — IRON AND IRON BINDING CAPACITY (CC-WL,HP ONLY)
Iron: 51 ug/dL (ref 28–170)
Saturation Ratios: 12 % (ref 10.4–31.8)
TIBC: 423 ug/dL (ref 250–450)
UIBC: 372 ug/dL

## 2021-10-26 LAB — FERRITIN: Ferritin: 34 ng/mL (ref 11–307)

## 2021-11-08 ENCOUNTER — Ambulatory Visit (INDEPENDENT_AMBULATORY_CARE_PROVIDER_SITE_OTHER): Payer: Medicare Other | Admitting: Bariatrics

## 2021-11-08 ENCOUNTER — Encounter (INDEPENDENT_AMBULATORY_CARE_PROVIDER_SITE_OTHER): Payer: Self-pay | Admitting: Bariatrics

## 2021-11-08 VITALS — BP 142/75 | HR 69 | Temp 97.8°F | Ht 62.0 in | Wt 201.0 lb

## 2021-11-08 DIAGNOSIS — N393 Stress incontinence (female) (male): Secondary | ICD-10-CM

## 2021-11-08 DIAGNOSIS — E8881 Metabolic syndrome: Secondary | ICD-10-CM

## 2021-11-08 DIAGNOSIS — Z6841 Body Mass Index (BMI) 40.0 and over, adult: Secondary | ICD-10-CM

## 2021-11-08 DIAGNOSIS — Z6836 Body mass index (BMI) 36.0-36.9, adult: Secondary | ICD-10-CM

## 2021-11-08 DIAGNOSIS — E669 Obesity, unspecified: Secondary | ICD-10-CM | POA: Diagnosis not present

## 2021-11-08 MED ORDER — SOLIFENACIN SUCCINATE 5 MG PO TABS: 5.0000 mg | ORAL_TABLET | Freq: Every day | ORAL | 0 refills | Status: DC

## 2021-11-09 NOTE — Progress Notes (Signed)
Chief Complaint:   OBESITY Vanessa Christensen is here to discuss her progress with her obesity treatment plan along with follow-up of her obesity related diagnoses. Vanessa Christensen is on the Category 3 Plan and keeping a food journal and adhering to recommended goals of 1700-1800 calories and 100 grams of protein and states she is following her eating plan approximately 30% of the time. Vanessa Christensen states she is going to the gym for 60 minutes 3-4 times per week.  Today's visit was #: 49 Starting weight: 241 lbs Starting date: 08/12/2020 Today's weight: 201 lbs Today's date: 11/08/2021 Total lbs lost to date: 40 lbs Total lbs lost since last in-office visit: 0  Interim History: Vanessa Christensen is up 7 lbs since her last visit. She has not been on the plan. She has been having more social time and eating out more.   Subjective:   1. Stress incontinence Vanessa Christensen is taking Vesicare. She notes it is effective.   2. Insulin resistance Vanessa Christensen is not on medications currently.   Assessment/Plan:   1. Stress incontinence We will refill Vesicare 5 mg for 3 months with no refills.   - solifenacin (VESICARE) 5 MG tablet; Take 1 tablet (5 mg total) by mouth daily.  Dispense: 90 tablet; Refill: 0  2. Insulin resistance Vanessa Christensen will refrain from sweets and starches as much as possible. She will continue to work on weight loss, exercise, and decreasing simple carbohydrates to help decrease the risk of diabetes. Vanessa Christensen agreed to follow-up with [name] as directed to closely monitor her progress.  3. Obesity, current BMI 36.8 Vanessa Christensen is currently in the action stage of change. As such, her goal is to continue with weight loss efforts. She has agreed to keeping a food journal and adhering to recommended goals of 1700-1800 calories and 100 grams of protein.   Vanessa Christensen will continue meal planning and she will continue intentional eating. Eating Out Sheet and Better Snacking Options was provided today.   Exercise goals:  Vanessa Christensen fell down  steps and fractured her toes. She will make adjustments at the gym.   Behavioral modification strategies: increasing lean protein intake, decreasing simple carbohydrates, increasing vegetables, increasing water intake, decreasing eating out, no skipping meals, meal planning and cooking strategies, keeping healthy foods in the home, and planning for success.  Vanessa Christensen has agreed to follow-up with our clinic in 4 weeks. She was informed of the importance of frequent follow-up visits to maximize her success with intensive lifestyle modifications for her multiple health conditions.   Objective:   Blood pressure (!) 142/75, pulse 69, temperature 97.8 F (36.6 C), height '5\' 2"'$  (1.575 m), weight 201 lb (91.2 kg), SpO2 97 %. Body mass index is 36.76 kg/m.  General: Cooperative, alert, well developed, in no acute distress. HEENT: Conjunctivae and lids unremarkable. Cardiovascular: Regular rhythm.  Lungs: Normal work of breathing. Neurologic: No focal deficits.   Lab Results  Component Value Date   CREATININE 0.67 07/28/2021   BUN 18 07/28/2021   NA 139 07/28/2021   K 4.1 07/28/2021   CL 107 07/28/2021   CO2 26 07/28/2021   Lab Results  Component Value Date   ALT 22 07/28/2021   AST 21 07/28/2021   ALKPHOS 61 07/28/2021   BILITOT 0.5 07/28/2021   Lab Results  Component Value Date   HGBA1C 5.2 12/08/2020   HGBA1C 5.6 01/15/2020   HGBA1C 5.4 11/04/2018   HGBA1C 5.6 08/13/2018   Lab Results  Component Value Date   INSULIN 4.7 12/08/2020  INSULIN 8.0 01/15/2020   INSULIN 10.9 11/04/2018   INSULIN 13.2 08/13/2018   Lab Results  Component Value Date   TSH 0.79 07/27/2021   Lab Results  Component Value Date   CHOL 189 12/08/2020   HDL 47 12/08/2020   LDLCALC 127 (H) 12/08/2020   TRIG 80 12/08/2020   CHOLHDL 4 05/08/2018   Lab Results  Component Value Date   VD25OH 32.84 07/27/2021   VD25OH 74.7 12/08/2020   VD25OH 36.9 01/15/2020   Lab Results  Component Value Date    WBC 4.9 10/25/2021   HGB 12.2 10/25/2021   HCT 37.2 10/25/2021   MCV 88.2 10/25/2021   PLT 164 10/25/2021   Lab Results  Component Value Date   IRON 51 10/25/2021   TIBC 423 10/25/2021   FERRITIN 34 10/25/2021   Attestation Statements:   Reviewed by clinician on day of visit: allergies, medications, problem list, medical history, surgical history, family history, social history, and previous encounter notes.  I, Lizbeth Bark, RMA, am acting as Location manager for [employer], DO.  I have reviewed the above documentation for accuracy and completeness, and I agree with the above. Jearld Lesch, DO

## 2021-11-14 ENCOUNTER — Encounter (INDEPENDENT_AMBULATORY_CARE_PROVIDER_SITE_OTHER): Payer: Self-pay | Admitting: Bariatrics

## 2021-12-05 ENCOUNTER — Encounter (INDEPENDENT_AMBULATORY_CARE_PROVIDER_SITE_OTHER): Payer: Self-pay | Admitting: Bariatrics

## 2021-12-05 ENCOUNTER — Ambulatory Visit (INDEPENDENT_AMBULATORY_CARE_PROVIDER_SITE_OTHER): Payer: Medicare Other | Admitting: Bariatrics

## 2021-12-05 VITALS — BP 132/73 | HR 72 | Temp 98.0°F | Ht 62.0 in | Wt 200.0 lb

## 2021-12-05 DIAGNOSIS — Z6836 Body mass index (BMI) 36.0-36.9, adult: Secondary | ICD-10-CM | POA: Diagnosis not present

## 2021-12-05 DIAGNOSIS — E038 Other specified hypothyroidism: Secondary | ICD-10-CM

## 2021-12-05 DIAGNOSIS — E8881 Metabolic syndrome: Secondary | ICD-10-CM

## 2021-12-05 DIAGNOSIS — E669 Obesity, unspecified: Secondary | ICD-10-CM | POA: Diagnosis not present

## 2021-12-06 NOTE — Progress Notes (Unsigned)
Chief Complaint:   OBESITY Vanessa Christensen is here to discuss her progress with her obesity treatment plan along with follow-up of her obesity related diagnoses. Vanessa Christensen is on keeping a food journal and adhering to recommended goals of 1700-1800 calories and 100 grams of protein daily and states she is following her eating plan approximately 30% of the time. Vanessa Christensen states she is at the gym for 60 minutes 4-5 times per week.  Today's visit was #: 60 Starting weight: 241 lbs Starting date: 08/12/2020 Today's weight: 200 lbs Today's date: 12/05/2021 Total lbs lost to date: 41 Total lbs lost since last in-office visit: 1  Interim History: Vanessa Christensen is down an additional pound since her last visit.  She is careful when she goes out to eat.  Subjective:   1. Other specified hypothyroidism Vanessa Christensen is taking Synthroid.  2. Insulin resistance Vanessa Christensen is not on medications currently.  Assessment/Plan:   1. Other specified hypothyroidism Vanessa Christensen will continue taking Synthroid as directed.  2. Insulin resistance Vanessa Christensen will keep all starches and sugars low.  3. Obesity, current BMI 36.6 Vanessa Christensen is currently in the action stage of change. As such, her goal is to continue with weight loss efforts. She has agreed to the Category 3 Plan.   Meal planning and intentional eating were discussed.  She will make good choices and decisions, and she will make better selections for new refrigerator.  She will portion control.  And she will do fruit mix and lites soup before eating.  Exercise goals: As is.   Behavioral modification strategies: increasing lean protein intake, decreasing simple carbohydrates, increasing vegetables, increasing water intake, decreasing eating out, no skipping meals, meal planning and cooking strategies, keeping healthy foods in the home, and planning for success.  Vanessa Christensen has agreed to follow-up with our clinic in 4 weeks. She was informed of the importance of frequent follow-up visits  to maximize her success with intensive lifestyle modifications for her multiple health conditions.   Objective:   Blood pressure 132/73, pulse 72, temperature 98 F (36.7 C), height '5\' 2"'$  (1.575 m), weight 200 lb (90.7 kg), SpO2 97 %. Body mass index is 36.58 kg/m.  General: Cooperative, alert, well developed, in no acute distress. HEENT: Conjunctivae and lids unremarkable. Cardiovascular: Regular rhythm.  Lungs: Normal work of breathing. Neurologic: No focal deficits.   Lab Results  Component Value Date   CREATININE 0.67 07/28/2021   BUN 18 07/28/2021   NA 139 07/28/2021   K 4.1 07/28/2021   CL 107 07/28/2021   CO2 26 07/28/2021   Lab Results  Component Value Date   ALT 22 07/28/2021   AST 21 07/28/2021   ALKPHOS 61 07/28/2021   BILITOT 0.5 07/28/2021   Lab Results  Component Value Date   HGBA1C 5.2 12/08/2020   HGBA1C 5.6 01/15/2020   HGBA1C 5.4 11/04/2018   HGBA1C 5.6 08/13/2018   Lab Results  Component Value Date   INSULIN 4.7 12/08/2020   INSULIN 8.0 01/15/2020   INSULIN 10.9 11/04/2018   INSULIN 13.2 08/13/2018   Lab Results  Component Value Date   TSH 0.79 07/27/2021   Lab Results  Component Value Date   CHOL 189 12/08/2020   HDL 47 12/08/2020   LDLCALC 127 (H) 12/08/2020   TRIG 80 12/08/2020   CHOLHDL 4 05/08/2018   Lab Results  Component Value Date   VD25OH 32.84 07/27/2021   VD25OH 74.7 12/08/2020   VD25OH 36.9 01/15/2020   Lab Results  Component Value Date  WBC 4.9 10/25/2021   HGB 12.2 10/25/2021   HCT 37.2 10/25/2021   MCV 88.2 10/25/2021   PLT 164 10/25/2021   Lab Results  Component Value Date   IRON 51 10/25/2021   TIBC 423 10/25/2021   FERRITIN 34 10/25/2021   Attestation Statements:   Reviewed by clinician on day of visit: allergies, medications, problem list, medical history, surgical history, family history, social history, and previous encounter notes.   Wilhemena Durie, am acting as Location manager for [employer], DO.  I have reviewed the above documentation for accuracy and completeness, and I agree with the above. Jearld Lesch, DO

## 2021-12-07 ENCOUNTER — Encounter (INDEPENDENT_AMBULATORY_CARE_PROVIDER_SITE_OTHER): Payer: Self-pay | Admitting: Bariatrics

## 2021-12-26 ENCOUNTER — Other Ambulatory Visit: Payer: Self-pay | Admitting: Family Medicine

## 2022-01-09 ENCOUNTER — Encounter (INDEPENDENT_AMBULATORY_CARE_PROVIDER_SITE_OTHER): Payer: Self-pay | Admitting: Bariatrics

## 2022-01-09 ENCOUNTER — Ambulatory Visit (INDEPENDENT_AMBULATORY_CARE_PROVIDER_SITE_OTHER): Payer: Medicare Other | Admitting: Bariatrics

## 2022-01-09 VITALS — BP 121/76 | HR 72 | Temp 98.0°F | Ht 62.0 in | Wt 202.0 lb

## 2022-01-09 DIAGNOSIS — E8881 Metabolic syndrome: Secondary | ICD-10-CM

## 2022-01-09 DIAGNOSIS — Z6837 Body mass index (BMI) 37.0-37.9, adult: Secondary | ICD-10-CM | POA: Diagnosis not present

## 2022-01-09 DIAGNOSIS — E038 Other specified hypothyroidism: Secondary | ICD-10-CM | POA: Diagnosis not present

## 2022-01-09 DIAGNOSIS — E669 Obesity, unspecified: Secondary | ICD-10-CM | POA: Diagnosis not present

## 2022-01-11 ENCOUNTER — Encounter (INDEPENDENT_AMBULATORY_CARE_PROVIDER_SITE_OTHER): Payer: Self-pay

## 2022-01-17 ENCOUNTER — Encounter (INDEPENDENT_AMBULATORY_CARE_PROVIDER_SITE_OTHER): Payer: Self-pay | Admitting: Bariatrics

## 2022-01-17 NOTE — Progress Notes (Signed)
Chief Complaint:   OBESITY Vanessa Christensen is here to discuss her progress with her obesity treatment plan along with follow-up of her obesity related diagnoses. Vanessa Christensen is on the Category 3 Plan and states she is following her eating plan approximately 30% of the time. Vanessa Christensen states she is doing 0 minutes 0 times per week.  Today's visit was #: 39 Starting weight: 241 lbs Starting date: 08/12/2020 Today's weight: 202 lbs Today's date: 01/09/2022 Total lbs lost to date: 39 Total lbs lost since last in-office visit: 0  Interim History: Vanessa Christensen is up about 2 lbs since her last visit. She has been at the beach.   Subjective:   1. Other specified hypothyroidism Allegra is taking Synthroid.  2. Insulin resistance Vanessa Christensen is not on medications currently.   Assessment/Plan:   1. Other specified hypothyroidism Vanessa Christensen will continue Synthroid as directed.   2. Insulin resistance Debar will continue to decrease carbohydrates (sweets and starches).   3. Obesity, current BMI 37.1 Vanessa Christensen is currently in the action stage of change. As such, her goal is to continue with weight loss efforts. She has agreed to the Category 3 Plan.   She will adhere closely to the plan.  Intentional eating was discussed.  Exercise goals: She will get back to the gym.  Behavioral modification strategies: increasing lean protein intake, decreasing simple carbohydrates, increasing vegetables, increasing water intake, decreasing eating out, no skipping meals, meal planning and cooking strategies, keeping healthy foods in the home, and planning for success.  Vanessa Christensen has agreed to follow-up with our clinic in 4 weeks. She was informed of the importance of frequent follow-up visits to maximize her success with intensive lifestyle modifications for her multiple health conditions.   Objective:   Blood pressure 121/76, pulse 72, temperature 98 F (36.7 C), height '5\' 2"'$  (1.575 m), weight 202 lb (91.6 kg), SpO2 96 %. Body  mass index is 36.95 kg/m.  General: Cooperative, alert, well developed, in no acute distress. HEENT: Conjunctivae and lids unremarkable. Cardiovascular: Regular rhythm.  Lungs: Normal work of breathing. Neurologic: No focal deficits.   Lab Results  Component Value Date   CREATININE 0.67 07/28/2021   BUN 18 07/28/2021   NA 139 07/28/2021   K 4.1 07/28/2021   CL 107 07/28/2021   CO2 26 07/28/2021   Lab Results  Component Value Date   ALT 22 07/28/2021   AST 21 07/28/2021   ALKPHOS 61 07/28/2021   BILITOT 0.5 07/28/2021   Lab Results  Component Value Date   HGBA1C 5.2 12/08/2020   HGBA1C 5.6 01/15/2020   HGBA1C 5.4 11/04/2018   HGBA1C 5.6 08/13/2018   Lab Results  Component Value Date   INSULIN 4.7 12/08/2020   INSULIN 8.0 01/15/2020   INSULIN 10.9 11/04/2018   INSULIN 13.2 08/13/2018   Lab Results  Component Value Date   TSH 0.79 07/27/2021   Lab Results  Component Value Date   CHOL 189 12/08/2020   HDL 47 12/08/2020   LDLCALC 127 (H) 12/08/2020   TRIG 80 12/08/2020   CHOLHDL 4 05/08/2018   Lab Results  Component Value Date   VD25OH 32.84 07/27/2021   VD25OH 74.7 12/08/2020   VD25OH 36.9 01/15/2020   Lab Results  Component Value Date   WBC 4.9 10/25/2021   HGB 12.2 10/25/2021   HCT 37.2 10/25/2021   MCV 88.2 10/25/2021   PLT 164 10/25/2021   Lab Results  Component Value Date   IRON 51 10/25/2021   TIBC 423  10/25/2021   FERRITIN 34 10/25/2021   Attestation Statements:   Reviewed by clinician on day of visit: allergies, medications, problem list, medical history, surgical history, family history, social history, and previous encounter notes.   Wilhemena Durie , am acting as Location manager for [employer], DO.  I have reviewed the above documentation for accuracy and completeness, and I agree with the above. Jearld Lesch, DO

## 2022-01-20 ENCOUNTER — Other Ambulatory Visit: Payer: Self-pay | Admitting: Family Medicine

## 2022-01-20 DIAGNOSIS — E038 Other specified hypothyroidism: Secondary | ICD-10-CM

## 2022-01-25 ENCOUNTER — Ambulatory Visit (INDEPENDENT_AMBULATORY_CARE_PROVIDER_SITE_OTHER): Payer: Medicare Other | Admitting: Family Medicine

## 2022-01-25 ENCOUNTER — Ambulatory Visit: Payer: Self-pay

## 2022-01-25 VITALS — BP 158/88 | HR 69 | Ht 62.0 in | Wt 208.0 lb

## 2022-01-25 DIAGNOSIS — M25572 Pain in left ankle and joints of left foot: Secondary | ICD-10-CM | POA: Diagnosis not present

## 2022-01-25 NOTE — Progress Notes (Unsigned)
I, Peterson Lombard, LAT, ATC acting as a scribe for [name], MD.  Subjective:    CC: L ankle pain  HPI: Pt is a 71 y/o female c/o L ankle pain. Pt was previously seen by Dr. [name] in 2021 for her R knee. Today, pt c/o L ankle pain ongoing for about 1 week w/ no direct MOI. Pt noticed that her L ankle started feeling weak and then her ankle gave out on her on Sunday when exiting her car at the grocery store. Pt locates pain to the distal Achilles tending on medial calcaneous. Pt notes not pain at rest. 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   L ankle swelling: yes Aggravates: walking (toe off),  Treatments tried: ankle brace, ice, voltaren gel  Pertinent review of Systems: No fevers or chills  Relevant historical information: Breast cancer.  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   Objective:    Vitals:   01/25/22 1536  BP: (!) 158/88  Pulse: 69  SpO2: 99%   General: Well Developed, well nourished, and in no acute distress.   MSK: Left ankle: Pes planus with some ankle pronation.  Otherwise normal-appearing Normal ankle motion. Mild tender palpation along the course of the posterior tibialis tendon and mildly tender at Achilles tendon. Intact strength. Some pain with resisted foot eversion. Pulses capillary refill and sensation are intact distally.  L-spine: Normal lumbar motion.  Negative slump test.  Lab and Radiology Results  Diagnostic Limited MSK Ultrasound of: Left ankle Achilles tendon normal-appearing without visible tear or significant retrocalcaneal bursitis. Posterior tibialis tendon minimal hypoechoic fluid tracks around tendon and tendon sheath at medial malleolus.  No visible tear.  Largely benign appearing posterior tibialis tendon. Medial ankle degenerative appearing. 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     Impression and  Recommendations:    Assessment and Plan: 71 y.o. female with left ankle pain thought to be due to exacerbation of DJD and posterior tibialis tendinitis.  Plan for good arch support and eccentric exercises focused on posterior tibialis tendinitis.  Additionally refer to physical therapy.  We talked about medication management.  Topical Voltaren gel and Tylenol are safe.  High-dose oral NSAIDs are generally not as safe as I would like it should be avoided for long-term use. Recheck in 6 weeks.  If she is struggling before the mission trip a cam walker boot would allow her to be little more comfortable.Marland Kitchen  PDMP not reviewed this encounter. Orders Placed This Encounter  Procedures   [name] LIMITED JOINT SPACE STRUCTURES LOW RIGHT(NO LINKED CHARGES)    Order Specific Question:   Reason for Exam (SYMPTOM  OR DIAGNOSIS REQUIRED)    Answer:   right ankle pain    Order Specific Question:   Preferred imaging location?    Answer:   [place]   Ambulatory referral to Physical Therapy    Referral Priority:   Routine    Referral Type:   Physical Medicine    Referral Reason:   Specialty Services Required    Requested Specialty:   Physical Therapy    Number of Visits Requested:   1   No orders of the defined types were placed in this encounter.   Discussed warning signs or symptoms. Please see discharge instructions. Patient expresses understanding.   The above documentation has been reviewed and is accurate and complete [name],  M.D.  

## 2022-01-25 NOTE — Patient Instructions (Addendum)
Thank you for coming in today.   I've referred you to Physical Therapy.  Let us know if you don't hear from them in one week.   Please use Voltaren gel (Generic Diclofenac Gel) up to 4x daily for pain as needed.  This is available over-the-counter as both the name brand Voltaren gel and the generic diclofenac gel.   You can use a cam walker boot if needed.   Use arch support. [employer] will sell you some arch support insoles to go in your shoes.   Recheck in about 6 weeks or sooner if needed.

## 2022-02-07 NOTE — Therapy (Addendum)
OUTPATIENT PHYSICAL THERAPY LOWER EXTREMITY EVALUATION   Patient Name: Vanessa Christensen MRN: [id number] DOB:[birth year], 71 y.o., female Today's Date: 02/10/2022   PT End of Session - 02/10/22 1154     Visit Number 1    Number of Visits 6    Date for PT Re-Evaluation 04/06/22    PT Start Time 1100    PT Stop Time 1145    PT Time Calculation (min) 45 min    Activity Tolerance Patient tolerated treatment well    Behavior During Therapy [facility] for tasks assessed/performed             Past Medical History:  Diagnosis Date   Anemia    hx of   Anxiety    pt denies   Arthritis    hands and feet   Cancer ([place]) 10/04/2006   breast   Cataracts, both eyes    Depression    Depression    Diverticulosis of colon    Glaucoma    Heart murmur    No work up done 5 years ago   Joint pain    Knee pain    Obesity    OSA on CPAP    Osteoarthritis    Sleep apnea    no CPAP- no longer needed d/t weight loss   Thyroid activity decreased    Past Surgical History:  Procedure Laterality Date   BREAST SURGERY Bilateral    CATARACT EXTRACTION     COLONOSCOPY     GLAUCOMA REPAIR     MASTECTOMY, RADICAL Bilateral    neulasta induced sterile abscesses     THYROIDECTOMY, PARTIAL     TONSILLECTOMY     TOTAL KNEE ARTHROPLASTY Right    TOTAL KNEE REVISION Right 05/07/2020   Procedure: RIGHT TOTAL KNEE REVISION ARTHROPLASTY;  Surgeon: Mcarthur Rossetti, MD;  Location: [facility];  Service: Orthopedics;  Laterality: Right;   Patient Active Problem List   Diagnosis Date Noted   Stress incontinence 11/08/2021   Unilateral primary osteoarthritis, left knee 12/27/2020   Status post revision of total replacement of right knee 05/07/2020   Failed total knee, right, 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/2020   History of total right knee replacement 01/28/2020   Vitamin D deficiency 08/29/2018   Insulin resistance 08/29/2018   Other specified glaucoma 08/14/2018   Incontinence of urine in female 11/07/2017    Vertigo 11/07/2017   Left knee pain 10/25/2016   Genetic testing 08/08/2016   Hypothyroidism 12/02/2015   Anxiety and depression 12/02/2015   OAB (overactive bladder) 12/02/2015   Breast cancer of upper-outer quadrant of left female breast ([place]) 12/02/2015   Obesity (BMI 30-39.9) 12/02/2015    PCP: [name], MD  REFERRING PROVIDER: Gregor Hams, MD  REFERRING DIAG: Acute left ankle pain  THERAPY DIAG:  Difficulty walking  Other abnormalities of gait and mobility  Pain in left ankle and joints of left foot  Rationale for Evaluation and Treatment Rehabilitation  ONSET DATE: 3 weeks.   SUBJECTIVE:   SUBJECTIVE STATEMENT: Pt is a 71 y/o female c/o L ankle pain. She reports that it stated about 3 weeks ago when she was exiting [employer]. Pt reports falling forward due to pain in her L ankle. Pt locates pain to the distal Achilles tendon on medial calcaneous. Pt reports no pain at rest, but notes pain when she pushes off her foot. She is going on a mission trip to [place] on 02/27/2022 where she will be walking/ standing a lot for 4  days. She denies N/T into L foot.   PERTINENT HISTORY: Anxiety, anemia, arthritis in hands/ feet, depression, obesity, hypothyroidism.   PAIN:  Are you having pain? Yes: NPRS scale: 4/10 at worst, currently 0/10 Pain location: Achillis tendon,  Pain description: Sharp pain Aggravating factors: Walking, stairs, squatting.  Relieving factors: Rest, Voltaren   PRECAUTIONS: None  WEIGHT BEARING RESTRICTIONS No  FALLS:  Has patient fallen in last 6 months? Yes. Number of falls 1  LIVING ENVIRONMENT: Lives with: lives with their spouse Lives in: House/apartment Stairs: Yes: Internal: 12 steps; on right going up and External: 3 steps; bilateral but cannot reach both Has following equipment at home: None  OCCUPATION: Retired.   PLOF: Independent  PATIENT GOALS Pt would like to be able to walk without pain.     OBJECTIVE:   DIAGNOSTIC FINDINGS: None    COGNITION:  Overall cognitive status: Within functional limits for tasks assessed     SENSATION: WFL  POSTURE: No Significant postural limitations  PALPATION: No tenderness to palpation.   LOWER EXTREMITY ROM:  Active ROM Right eval Left eval  Ankle dorsiflexion 13 10  Ankle plantarflexion 45 45  Ankle inversion 30 30  Ankle eversion 18 15   (Blank rows = not tested)  LOWER EXTREMITY MMT:  MMT Right eval Left eval  Hip flexion 5/5 5/5  Hip abduction    Knee flexion 5/5 5/5  Knee extension 5/5 5/5   (Blank rows = not tested)   FUNCTIONAL TESTS:  Timed up and go (TUG): 18.40 sec   GAIT: Distance walked: 50 ft  Assistive device utilized: None Level of assistance: Complete Independence Comments: Pt walks with antalgic gait with decreased stance time on the L LE and IV.  TODAY'S TREATMENT: Creating, reviewing, and completing below HEP   PATIENT EDUCATION:  Education details: Educated pt on anatomy and physiology of current symptoms, FOTO, diagnosis, prognosis, HEP,  and POC. Person educated: Patient Education method: Customer service manager Education comprehension: verbalized understanding and returned demonstration   HOME EXERCISE PROGRAM: Access Code: [email or web address] URL: [email or web address] Date: 02/10/2022 Prepared by: Rudi Heap  Exercises - Seated Ankle Circles  - 2 x daily - 7 x weekly - 2 sets - 10 reps - Seated Ankle Pumps  - 2 x daily - 7 x weekly - 2 sets - 10 reps - Gastroc Stretch on Wall  - 2 x daily - 7 x weekly - 2 sets - 2 reps - 30 hold   ASSESSMENT:  CLINICAL IMPRESSION: Patient referred to PT for Acute left ankle pain. Patient will benefit from skilled PT to address below impairments, limitations and improve overall function.  OBJECTIVE IMPAIRMENTS: decreased activity tolerance, difficulty walking, decreased balance, decreased endurance, decreased mobility,  decreased ROM, decreased strength, impaired flexibility, impaired LE use, postural dysfunction, and pain.  ACTIVITY LIMITATIONS: bending, lifting, carry, locomotion, cleaning, community activity, driving, and or occupation  PERSONAL FACTORS: Anxiety, anemia, arthritis in hands/ feet, depression, obesity, hypothyroidism.  are also affecting patient's functional outcome.  REHAB POTENTIAL: Good  CLINICAL DECISION MAKING: Stable/uncomplicated  EVALUATION COMPLEXITY: Low    GOALS: Short term PT Goals Target date: 03/10/2022 Pt will be I and compliant with HEP. Baseline:  Goal status: New Pt will decrease pain by 25% overall Baseline: Goal status: New  Long term PT goals Target date: 04/07/2022 Pt will improve TUG by 3.4 seconds or Minimal clinically important difference.   Baseline  Goal status: New Pt will reduce pain to overall less than 1-2/10 with  usual activity and work activity. Baseline: Goal status: New Pt will be able to ambulate community distances at least 1000 ft WNL gait pattern without complaints Baseline: Goal status: New  PLAN: PT FREQUENCY: 1-2 times per week   PT DURATION: 6-8 weeks  PLANNED INTERVENTIONS (unless contraindicated): aquatic PT, Canalith repositioning, cryotherapy, Electrical stimulation, Iontophoresis with 4 mg/ml dexamethasome, Moist heat, traction, Ultrasound, gait training, Therapeutic exercise, balance training, neuromuscular re-education, patient/family education, prosthetic training, manual techniques, passive ROM, dry needling, taping, vasopnuematic device, vestibular, spinal manipulations, joint manipulations   PLAN FOR NEXT SESSION: Assess HEP/update PRN, continue to progress functional mobility, strengthen foot intrinsics muscles. Decrease patients pain and help minimize functional deficits. Balance, gait.     Lynden Ang, PT 02/10/2022, 11:55 AM

## 2022-02-08 ENCOUNTER — Ambulatory Visit (INDEPENDENT_AMBULATORY_CARE_PROVIDER_SITE_OTHER): Payer: Medicare Other | Admitting: Family Medicine

## 2022-02-08 ENCOUNTER — Encounter: Payer: Self-pay | Admitting: Family Medicine

## 2022-02-08 VITALS — BP 128/82 | HR 78 | Temp 97.9°F | Resp 16 | Ht 62.0 in | Wt 204.2 lb

## 2022-02-08 DIAGNOSIS — R413 Other amnesia: Secondary | ICD-10-CM

## 2022-02-08 DIAGNOSIS — Z23 Encounter for immunization: Secondary | ICD-10-CM

## 2022-02-08 LAB — CBC WITH DIFFERENTIAL/PLATELET
Basophils Absolute: 0 10*3/uL (ref 0.0–0.1)
Basophils Relative: 0.7 % (ref 0.0–3.0)
Eosinophils Absolute: 0 10*3/uL (ref 0.0–0.7)
Eosinophils Relative: 0.2 % (ref 0.0–5.0)
HCT: 39.9 % (ref 36.0–46.0)
Hemoglobin: 13.5 g/dL (ref 12.0–15.0)
Lymphocytes Relative: 39.2 % (ref 12.0–46.0)
Lymphs Abs: 1.3 10*3/uL (ref 0.7–4.0)
MCHC: 33.8 g/dL (ref 30.0–36.0)
MCV: 85.4 fl (ref 78.0–100.0)
Monocytes Absolute: 0.5 10*3/uL (ref 0.1–1.0)
Monocytes Relative: 14.2 % — ABNORMAL HIGH (ref 3.0–12.0)
Neutro Abs: 1.5 10*3/uL (ref 1.4–7.7)
Neutrophils Relative %: 45.7 % (ref 43.0–77.0)
Platelets: 139 10*3/uL — ABNORMAL LOW (ref 150.0–400.0)
RBC: 4.67 Mil/uL (ref 3.87–5.11)
RDW: 14.1 % (ref 11.5–15.5)
WBC: 3.3 10*3/uL — ABNORMAL LOW (ref 4.0–10.5)

## 2022-02-08 MED ORDER — BUPROPION HCL ER (XL) 150 MG PO TB24: 150.0000 mg | ORAL_TABLET | Freq: Every day | ORAL | 3 refills | Status: DC

## 2022-02-08 NOTE — Progress Notes (Signed)
   Subjective:    Patient ID: Vanessa Christensen, female    DOB: May 06, 1951, 71 y.o.   MRN: [id number]  HPI Memory issues- husband reports sxs started 'several months' ago.  Will have word finding issues which is causing her high anxiety.  Some difficulty getting to and from but has always been somewhat directionally challenged.  Having a hard time remembering names of people that she spends considerable time with.  No issue knowing how to use objects or their functionality.  Pt reports she is 'going through a lot of depression'.  Has increased 'lack of self worth'.  Has thoughts that it would be 'better if I weren't here so Gershon Mussel could have a better life'.  Pt reports she would have word finding issues as far back as 10 yrs ago.  Mom had dementia.   Review of Systems For ROS see HPI     Objective:   Physical Exam Vitals reviewed.  Constitutional:      General: She is not in acute distress.    Appearance: Normal appearance. She is obese. She is not ill-appearing.  HENT:     Head: Normocephalic and atraumatic.  Eyes:     Extraocular Movements: Extraocular movements intact.     Conjunctiva/sclera: Conjunctivae normal.     Pupils: Pupils are equal, round, and reactive to light.  Cardiovascular:     Rate and Rhythm: Normal rate and regular rhythm.  Pulmonary:     Effort: Pulmonary effort is normal. No respiratory distress.     Breath sounds: Normal breath sounds.  Skin:    General: Skin is warm and dry.  Neurological:     General: No focal deficit present.     Mental Status: She is alert and oriented to person, place, and time.     Cranial Nerves: No cranial nerve deficit.     Motor: No weakness.     Gait: Gait normal.  Psychiatric:     Comments: Flat affect, withdrawn, intermittently tearful           Assessment & Plan:  Memory loss- new.  Pt and husband note word finding issues, difficulty w/ directions, and some issues remembering names.  Pt is very concerned as mom had dementia.   She has had thoughts of death and that husband would be better off without her.  Talked about depression and pseudodementia.  Will start Wellbutrin.  Will check labs to rule out metabolic cause.  Will refer for neuropsych evaluation.  Will follow mood closely and monitor for symptom improvement.  Pt expressed understanding and is in agreement w/ plan.

## 2022-02-08 NOTE — Patient Instructions (Signed)
Follow up in 4-6 weeks to recheck mood We'll notify you of your lab results and make any changes if needed CONTINUE the Fluoxetine daily ADD the Bupropion (Wellbutrin) daily We will call you to schedule the Neuropsych testing Call with any questions or concerns Hang in there!!!

## 2022-02-09 LAB — BASIC METABOLIC PANEL
BUN: 17 mg/dL (ref 6–23)
CO2: 26 mEq/L (ref 19–32)
Calcium: 9.7 mg/dL (ref 8.4–10.5)
Chloride: 103 mEq/L (ref 96–112)
Creatinine, Ser: 0.71 mg/dL (ref 0.40–1.20)
GFR: 84.48 mL/min (ref >60.00)
Glucose, Bld: 78 mg/dL (ref 70–99)
Potassium: 4.6 mEq/L (ref 3.5–5.1)
Sodium: 138 mEq/L (ref 135–145)

## 2022-02-09 LAB — HEPATIC FUNCTION PANEL
ALT: 32 U/L (ref 0–35)
AST: 31 U/L (ref 0–37)
Albumin: 4.4 g/dL (ref 3.5–5.2)
Alkaline Phosphatase: 63 U/L (ref 39–117)
Bilirubin, Direct: 0.1 mg/dL (ref 0.0–0.3)
Total Bilirubin: 0.6 mg/dL (ref 0.2–1.2)
Total Protein: 7.3 g/dL (ref 6.0–8.3)

## 2022-02-09 LAB — TSH: TSH: 1.17 u[IU]/mL (ref 0.35–5.50)

## 2022-02-09 NOTE — Progress Notes (Signed)
Left pt a vm stating lab results  

## 2022-02-10 ENCOUNTER — Other Ambulatory Visit: Payer: Self-pay

## 2022-02-10 ENCOUNTER — Encounter: Payer: Self-pay | Admitting: Physical Therapy

## 2022-02-10 ENCOUNTER — Ambulatory Visit (INDEPENDENT_AMBULATORY_CARE_PROVIDER_SITE_OTHER): Payer: Medicare Other | Admitting: Physical Therapy

## 2022-02-10 DIAGNOSIS — R2689 Other abnormalities of gait and mobility: Secondary | ICD-10-CM | POA: Diagnosis not present

## 2022-02-10 DIAGNOSIS — R262 Difficulty in walking, not elsewhere classified: Secondary | ICD-10-CM | POA: Diagnosis not present

## 2022-02-10 DIAGNOSIS — M25572 Pain in left ankle and joints of left foot: Secondary | ICD-10-CM | POA: Diagnosis not present

## 2022-02-10 NOTE — Addendum Note (Signed)
Addended by: Lynden Ang on: 02/10/2022 12:05 PM   Modules accepted: Orders

## 2022-02-13 DIAGNOSIS — Z961 Presence of intraocular lens: Secondary | ICD-10-CM | POA: Diagnosis not present

## 2022-02-13 DIAGNOSIS — H401131 Primary open-angle glaucoma, bilateral, mild stage: Secondary | ICD-10-CM | POA: Diagnosis not present

## 2022-02-13 DIAGNOSIS — H524 Presbyopia: Secondary | ICD-10-CM | POA: Diagnosis not present

## 2022-02-13 DIAGNOSIS — H52203 Unspecified astigmatism, bilateral: Secondary | ICD-10-CM | POA: Diagnosis not present

## 2022-02-13 NOTE — Therapy (Unsigned)
OUTPATIENT PHYSICAL THERAPY LOWER EXTREMITY EVALUATION   Patient Name: Vanessa Christensen MRN: [id number] DOB:12-30-1950, 71 y.o., female Today's Date: 02/14/2022   PT End of Session - 02/14/22 1053     Visit Number 2    Number of Visits 6    Date for PT Re-Evaluation 04/06/22    PT Start Time 5929    PT Stop Time 1058    PT Time Calculation (min) 35 min    Activity Tolerance Patient tolerated treatment well    Behavior During Therapy [facility] for tasks assessed/performed              Past Medical History:  Diagnosis Date   Anemia    hx of   Anxiety    pt denies   Arthritis    hands and feet   Cancer ([place]) 10/04/2006   breast   Cataracts, both eyes    Depression    Depression    Diverticulosis of colon    Glaucoma    Heart murmur    No work up done 5 years ago   Joint pain    Knee pain    Obesity    OSA on CPAP    Osteoarthritis    Sleep apnea    no CPAP- no longer needed d/t weight loss   Thyroid activity decreased    Past Surgical History:  Procedure Laterality Date   BREAST SURGERY Bilateral    CATARACT EXTRACTION     COLONOSCOPY     GLAUCOMA REPAIR     MASTECTOMY, RADICAL Bilateral    neulasta induced sterile abscesses     THYROIDECTOMY, PARTIAL     TONSILLECTOMY     TOTAL KNEE ARTHROPLASTY Right    TOTAL KNEE REVISION Right 05/07/2020   Procedure: RIGHT TOTAL KNEE REVISION ARTHROPLASTY;  Surgeon: Mcarthur Rossetti, MD;  Location: [facility];  Service: Orthopedics;  Laterality: Right;   Patient Active Problem List   Diagnosis Date Noted   Stress incontinence 11/08/2021   Unilateral primary osteoarthritis, left knee 12/27/2020   Status post revision of total replacement of right knee 05/07/2020   Failed total knee, right, 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/2020   History of total right knee replacement 01/28/2020   Vitamin D deficiency 08/29/2018   Insulin resistance 08/29/2018   Other specified glaucoma 08/14/2018   Incontinence of urine in female  11/07/2017   Vertigo 11/07/2017   Left knee pain 10/25/2016   Genetic testing 08/08/2016   Hypothyroidism 12/02/2015   Anxiety and depression 12/02/2015   OAB (overactive bladder) 12/02/2015   Breast cancer of upper-outer quadrant of left female breast ([place]) 12/02/2015   Obesity (BMI 30-39.9) 12/02/2015    PCP: [name], MD  REFERRING PROVIDER: Gregor Hams, MD  REFERRING DIAG: Acute left ankle pain  THERAPY DIAG:  Difficulty walking  Other abnormalities of gait and mobility  Pain in left ankle and joints of left foot  Rationale for Evaluation and Treatment Rehabilitation  ONSET DATE: 3 weeks.   SUBJECTIVE:   SUBJECTIVE STATEMENT:  02/14/2022:  Pt states that she has not noted any changes in her ankle since the eval. She continues to go to the gym to perform UE exercises.   Pt is a 71 y/o female c/o L ankle pain. She reports that it stated about 3 weeks ago when she was exiting [employer]. Pt reports falling forward due to pain in her L ankle. Pt locates pain to the distal Achilles tendon on medial calcaneous. Pt reports no pain  at rest, but notes pain when she pushes off her foot. She is going on a mission trip to [place] on 02/27/2022 where she will be walking/ standing a lot for 4 days. She denies N/T into L foot.   PERTINENT HISTORY: Anxiety, anemia, arthritis in hands/ feet, depression, obesity, hypothyroidism.   PAIN:  Are you having pain? Yes: NPRS scale: 4/10 at worst, currently 0/10 Pain location: Achillis tendon,  Pain description: Sharp pain Aggravating factors: Walking, stairs, squatting.  Relieving factors: Rest, Voltaren   PRECAUTIONS: None  WEIGHT BEARING RESTRICTIONS No  FALLS:  Has patient fallen in last 6 months? Yes. Number of falls 1  LIVING ENVIRONMENT: Lives with: lives with their spouse Lives in: House/apartment Stairs: Yes: Internal: 12 steps; on right going up and External: 3 steps; bilateral but cannot reach  both Has following equipment at home: None  OCCUPATION: Retired.   PLOF: Independent  PATIENT GOALS Pt would like to be able to walk without pain.    OBJECTIVE:   DIAGNOSTIC FINDINGS: None    COGNITION:  Overall cognitive status: Within functional limits for tasks assessed     SENSATION: WFL  POSTURE: No Significant postural limitations  PALPATION: No tenderness to palpation.   LOWER EXTREMITY ROM:  Active ROM Right eval Left eval  Ankle dorsiflexion 13 10  Ankle plantarflexion 45 45  Ankle inversion 30 30  Ankle eversion 18 15   (Blank rows = not tested)  LOWER EXTREMITY MMT:  MMT Right eval Left eval  Hip flexion 5/5 5/5  Hip abduction    Knee flexion 5/5 5/5  Knee extension 5/5 5/5   (Blank rows = not tested)   FUNCTIONAL TESTS:  Timed up and go (TUG): 18.40 sec   GAIT: Distance walked: 50 ft  Assistive device utilized: None Level of assistance: Complete Independence Comments: Pt walks with antalgic gait with decreased stance time on the L LE and IV.  TODAY'S TREATMENT: [facility] Adult PT Treatment:                                                DATE: 02/14/2022 Therapeutic Exercise: Towel scrunches x30  Toe yoga x20  Ankle circles x20 each direction Ankle pumps x20  Plantar fascia stretch 2x 30 sec Gastroc stretch 2x 30 sec  Add 4 way ankles next session.   PATIENT EDUCATION:  Education details: Educated pt on anatomy and physiology of current symptoms, FOTO, diagnosis, prognosis, HEP,  and POC. Person educated: Patient Education method: Customer service manager Education comprehension: verbalized understanding and returned demonstration   HOME EXERCISE PROGRAM: Access Code: [email or web address] URL: [email or web address] Date: 02/10/2022 Prepared by: Rudi Heap  Exercises - Seated Ankle Circles  - 2 x daily - 7 x weekly - 2 sets - 10 reps - Seated Ankle Pumps  - 2 x daily - 7 x weekly - 2 sets - 10 reps - Gastroc Stretch on  Wall  - 2 x daily - 7 x weekly - 2 sets - 2 reps - 30 hold  02/14/2022: - Toe Yoga - Alternating Great Toe and Lesser Toe Extension  - 1 x daily - 7 x weekly - 3 sets - 10 reps - Seated Toe Towel Scrunches  - 2 x daily - 7 x weekly - 2 sets - 10 reps - Toe Spreading  - 2 x daily - 7 x  weekly - 2 sets - 10 reps - Seated Arch Lifts  - 2 x daily - 7 x weekly - 2 sets - 10 reps   ASSESSMENT:  CLINICAL IMPRESSION: Appt truncated due to patient arriving late. Pt tolerated all exercises well today with focus on ankle mobility and foot intrinsic strengthening. Pt reports no adverse effects to exercises today. Plan to increase ankle strengthening next session. Pt will continue to benefit from skilled PT to address continued deficits.    OBJECTIVE IMPAIRMENTS: decreased activity tolerance, difficulty walking, decreased balance, decreased endurance, decreased mobility, decreased ROM, decreased strength, impaired flexibility, impaired LE use, postural dysfunction, and pain.  ACTIVITY LIMITATIONS: bending, lifting, carry, locomotion, cleaning, community activity, driving, and or occupation  PERSONAL FACTORS: Anxiety, anemia, arthritis in hands/ feet, depression, obesity, hypothyroidism.  are also affecting patient's functional outcome.  REHAB POTENTIAL: Good  CLINICAL DECISION MAKING: Stable/uncomplicated  EVALUATION COMPLEXITY: Low    GOALS: Short term PT Goals Target date: 03/14/2022 Pt will be I and compliant with HEP. Baseline:  Goal status: New Pt will decrease pain by 25% overall Baseline: Goal status: New  Long term PT goals Target date: 04/11/2022 Pt will improve TUG by 3.4 seconds or Minimal clinically important difference.   Baseline  Goal status: New Pt will reduce pain to overall less than 1-2/10 with usual activity and work activity. Baseline: Goal status: New Pt will be able to ambulate community distances at least 1000 ft WNL gait pattern without  complaints Baseline: Goal status: New  PLAN: PT FREQUENCY: 1-2 times per week   PT DURATION: 6-8 weeks  PLANNED INTERVENTIONS (unless contraindicated): aquatic PT, Canalith repositioning, cryotherapy, Electrical stimulation, Iontophoresis with 4 mg/ml dexamethasome, Moist heat, traction, Ultrasound, gait training, Therapeutic exercise, balance training, neuromuscular re-education, patient/family education, prosthetic training, manual techniques, passive ROM, dry needling, taping, vasopnuematic device, vestibular, spinal manipulations, joint manipulations   PLAN FOR NEXT SESSION: Assess HEP/update PRN, continue to progress functional mobility, strengthen foot intrinsics muscles. Decrease patients pain and help minimize functional deficits. Balance, gait.     Lynden Ang, PT 02/14/2022, 11:01 AM

## 2022-02-14 ENCOUNTER — Ambulatory Visit (INDEPENDENT_AMBULATORY_CARE_PROVIDER_SITE_OTHER): Payer: Medicare Other | Admitting: Physical Therapy

## 2022-02-14 ENCOUNTER — Encounter: Payer: Self-pay | Admitting: Physical Therapy

## 2022-02-14 DIAGNOSIS — R2689 Other abnormalities of gait and mobility: Secondary | ICD-10-CM

## 2022-02-14 DIAGNOSIS — M25572 Pain in left ankle and joints of left foot: Secondary | ICD-10-CM | POA: Diagnosis not present

## 2022-02-14 DIAGNOSIS — R262 Difficulty in walking, not elsewhere classified: Secondary | ICD-10-CM | POA: Diagnosis not present

## 2022-02-14 IMAGING — DX DG KNEE 1-2V PORT*R*
2 series · 2 of 2 positions shown · non-contrast
Comparison: 11/27/2019

CLINICAL DATA: Right knee arthroplasty revision

EXAM:
PORTABLE RIGHT KNEE - 1-2 VIEW

[knee ap]
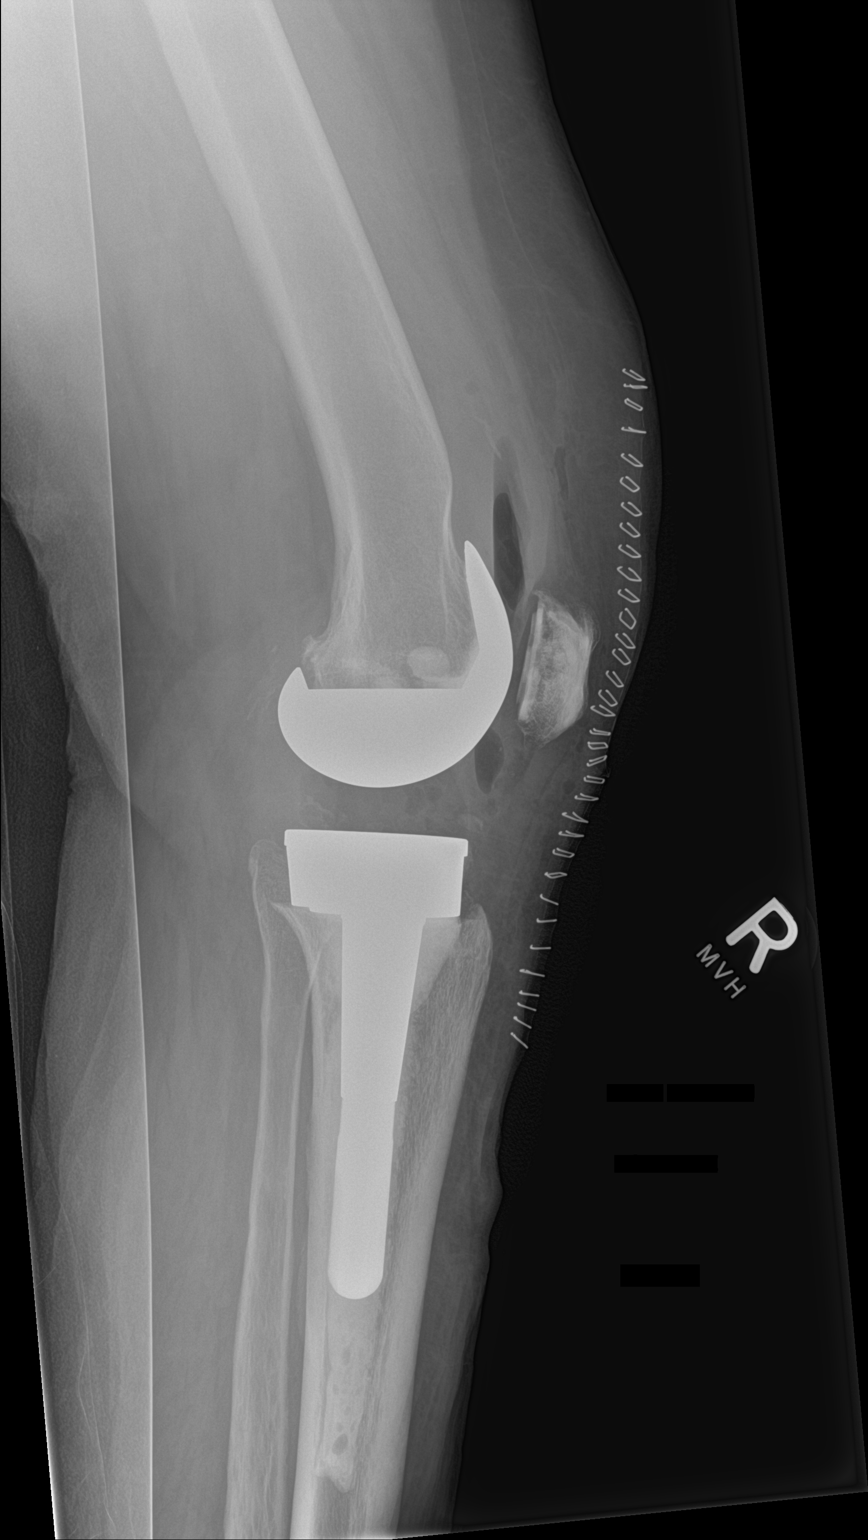

[knee lat]
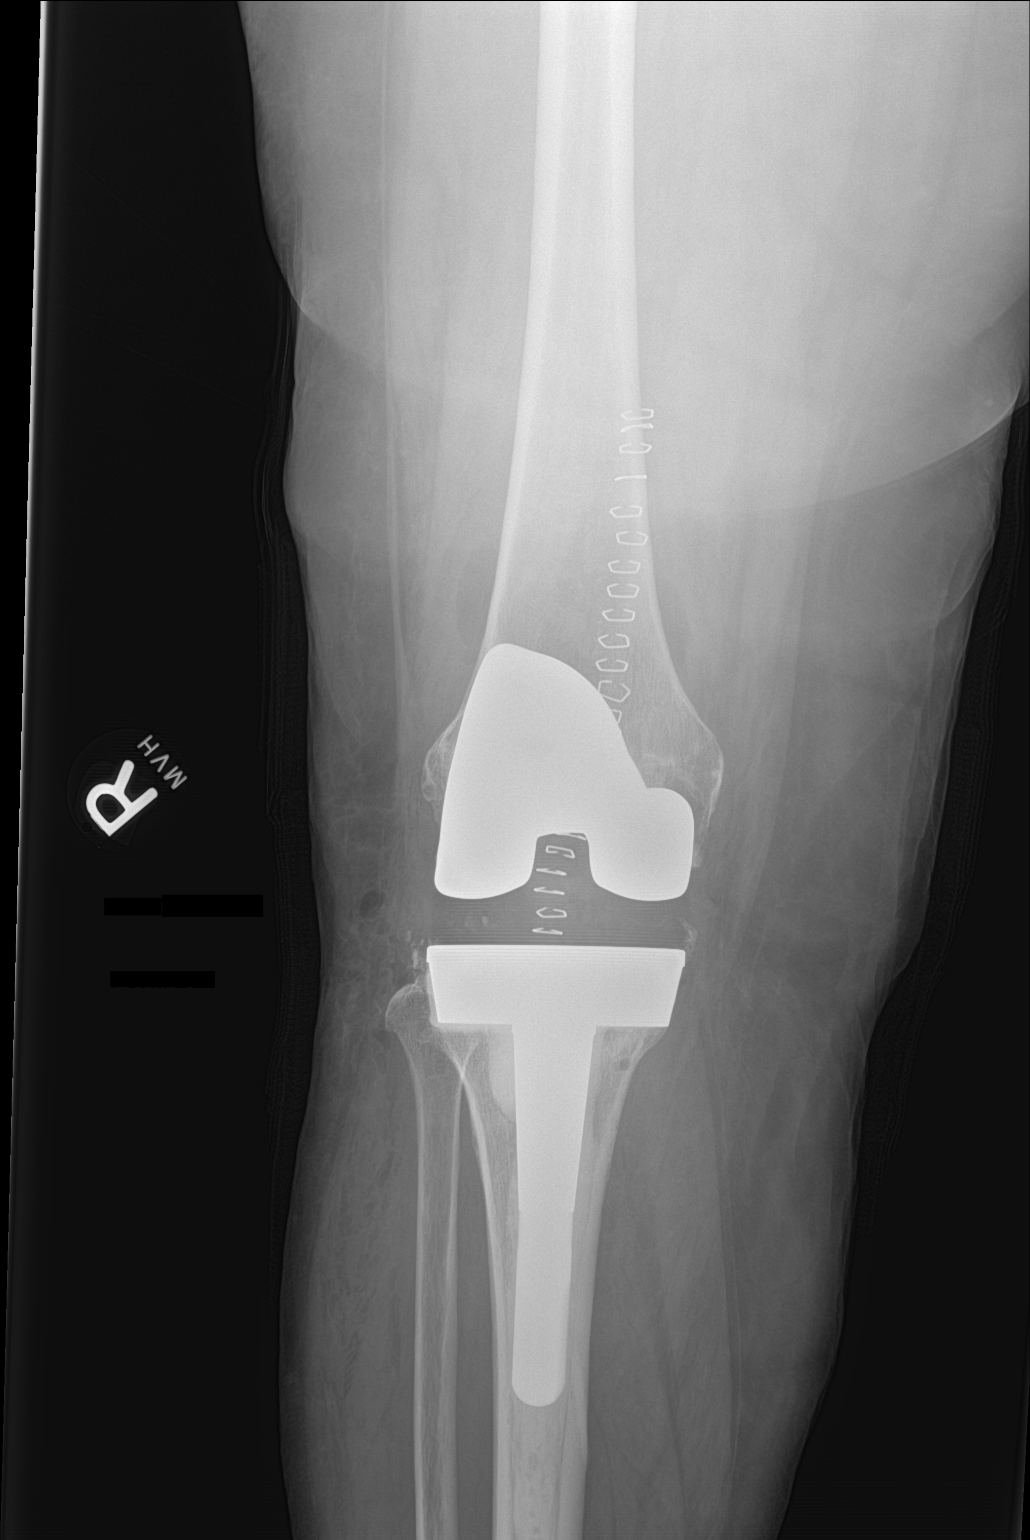

[2 of 2 positions shown; findings below may reference images not displayed]

FINDINGS: Frontal and lateral views of the right knee demonstrate revision of
the tibial component of the right knee arthroplasty, in the expected
position without signs of acute complication. Femoral and patellar
components are stable. Postsurgical changes are seen in the soft
tissues.
IMPRESSION: 1. Revision of right knee arthroplasty as above. No evidence of
complication.

## 2022-02-15 ENCOUNTER — Ambulatory Visit (INDEPENDENT_AMBULATORY_CARE_PROVIDER_SITE_OTHER): Payer: Medicare Other | Admitting: Bariatrics

## 2022-02-15 ENCOUNTER — Encounter: Payer: Self-pay | Admitting: Bariatrics

## 2022-02-15 VITALS — BP 149/88 | HR 66 | Temp 98.3°F | Ht 62.0 in | Wt 197.0 lb

## 2022-02-15 DIAGNOSIS — Z6836 Body mass index (BMI) 36.0-36.9, adult: Secondary | ICD-10-CM

## 2022-02-15 DIAGNOSIS — I10 Essential (primary) hypertension: Secondary | ICD-10-CM | POA: Diagnosis not present

## 2022-02-15 DIAGNOSIS — E668 Other obesity: Secondary | ICD-10-CM | POA: Diagnosis not present

## 2022-02-15 DIAGNOSIS — E65 Localized adiposity: Secondary | ICD-10-CM

## 2022-02-16 NOTE — Therapy (Addendum)
OUTPATIENT PHYSICAL THERAPY LOWER EXTREMITY EVALUATION   Patient Name: Vanessa Christensen MRN: [id number] DOB:02-01-1951, 71 y.o., female Today's Date: 02/16/2022      Past Medical History:  Diagnosis Date   Anemia    hx of   Anxiety    pt denies   Arthritis    hands and feet   Cancer ([place]) 10/04/2006   breast   Cataracts, both eyes    Depression    Depression    Diverticulosis of colon    Glaucoma    Heart murmur    No work up done 5 years ago   Joint pain    Knee pain    Obesity    OSA on CPAP    Osteoarthritis    Sleep apnea    no CPAP- no longer needed d/t weight loss   Thyroid activity decreased    Past Surgical History:  Procedure Laterality Date   BREAST SURGERY Bilateral    CATARACT EXTRACTION     COLONOSCOPY     GLAUCOMA REPAIR     MASTECTOMY, RADICAL Bilateral    neulasta induced sterile abscesses     THYROIDECTOMY, PARTIAL     TONSILLECTOMY     TOTAL KNEE ARTHROPLASTY Right    TOTAL KNEE REVISION Right 05/07/2020   Procedure: RIGHT TOTAL KNEE REVISION ARTHROPLASTY;  Surgeon: Mcarthur Rossetti, MD;  Location: [facility];  Service: Orthopedics;  Laterality: Right;   Patient Active Problem List   Diagnosis Date Noted   Stress incontinence 11/08/2021   Unilateral primary osteoarthritis, left knee 12/27/2020   Status post revision of total replacement of right knee 05/07/2020   Failed total knee, right, 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/2020   History of total right knee replacement 01/28/2020   Vitamin D deficiency 08/29/2018   Insulin resistance 08/29/2018   Other specified glaucoma 08/14/2018   Incontinence of urine in female 11/07/2017   Vertigo 11/07/2017   Left knee pain 10/25/2016   Genetic testing 08/08/2016   Hypothyroidism 12/02/2015   Anxiety and depression 12/02/2015   OAB (overactive bladder) 12/02/2015   Breast cancer of upper-outer quadrant of left female breast ([place]) 12/02/2015   Obesity (BMI 30-39.9) 12/02/2015    PCP: [name], MD  REFERRING PROVIDER: Gregor Hams, MD  REFERRING DIAG: Acute left ankle pain  THERAPY DIAG:  Difficulty walking Other abnormalities of gait and mobility  Pain in left ankle and joint of left foot Localized edema  Rationale for Evaluation and Treatment Rehabilitation  ONSET DATE: 3 weeks.   SUBJECTIVE:   SUBJECTIVE STATEMENT:  02/16/2022:  Pt states that she tripped over a pug on Wednesday night. She denies completely falling, but states that she twisted her ankle.  Pt is a 71 y/o female c/o L ankle pain. She reports that it stated about 3 weeks ago when she was exiting [employer]. Pt reports falling forward due to pain in her L ankle. Pt locates pain to the distal Achilles tendon on medial calcaneous. Pt reports no pain at rest, but notes pain when she pushes off her foot. She is going on a mission trip to [place] on 02/27/2022 where she will be walking/ standing a lot for 4 days. She denies N/T into L foot.   PERTINENT HISTORY: Anxiety, anemia, arthritis in hands/ feet, depression, obesity, hypothyroidism.   PAIN:  Are you having pain? Yes: NPRS scale: 4/10 at worst, currently 0/10 Pain location: Achillis tendon,  Pain description: Sharp pain Aggravating factors: Walking, stairs, squatting.  Relieving factors:  Rest, Voltaren   PRECAUTIONS: None  WEIGHT BEARING RESTRICTIONS No  FALLS:  Has patient fallen in last 6 months? Yes. Number of falls 1  LIVING ENVIRONMENT: Lives with: lives with their spouse Lives in: House/apartment Stairs: Yes: Internal: 12 steps; on right going up and External: 3 steps; bilateral but cannot reach both Has following equipment at home: None  OCCUPATION: Retired.   PLOF: Independent  PATIENT GOALS Pt would like to be able to walk without pain.    OBJECTIVE:   DIAGNOSTIC FINDINGS: None    COGNITION:  Overall cognitive status: Within functional limits for tasks assessed     SENSATION: WFL  POSTURE: No  Significant postural limitations  PALPATION: No tenderness to palpation.   LOWER EXTREMITY ROM:  Active ROM Right eval Left eval  Ankle dorsiflexion 13 10  Ankle plantarflexion 45 45  Ankle inversion 30 30  Ankle eversion 18 15   (Blank rows = not tested)  LOWER EXTREMITY MMT:  MMT Right eval Left eval  Hip flexion 5/5 5/5  Hip abduction    Knee flexion 5/5 5/5  Knee extension 5/5 5/5   (Blank rows = not tested)   FUNCTIONAL TESTS:  Timed up and go (TUG): 18.40 sec   GAIT: Distance walked: 50 ft  Assistive device utilized: None Level of assistance: Complete Independence Comments: Pt walks with antalgic gait with decreased stance time on the L LE and IV.  TODAY'S TREATMENT: [facility] Adult PT Treatment:                                                DATE: 02/17/2022 Therapeutic Exercise: Ankle pumps x30 elevated  Quad set with SLR x10  Glute sets  Vaso compression due to significant swelling and bruising in L ankle. Manual:    Effleurage to L ankle to reduce swelling  [facility] Adult PT Treatment:                                                DATE: 02/14/2022 Therapeutic Exercise: Towel scrunches x30  Toe yoga x20  Ankle circles x20 each direction Ankle pumps x20  Plantar fascia stretch 2x 30 sec Gastroc stretch 2x 30 sec  Add 4 way ankles next session.   PATIENT EDUCATION:  Education details: Educated pt on anatomy and physiology of current symptoms, FOTO, diagnosis, prognosis, HEP,  and POC. Person educated: Patient Education method: Customer service manager Education comprehension: verbalized understanding and returned demonstration   HOME EXERCISE PROGRAM: Access Code: [email or web address] URL: [email or web address] Date: 02/10/2022 Prepared by: Rudi Heap  Exercises - Seated Ankle Circles  - 2 x daily - 7 x weekly - 2 sets - 10 reps - Seated Ankle Pumps  - 2 x daily - 7 x weekly - 2 sets - 10 reps - Gastroc Stretch on Wall  - 2 x daily - 7 x  weekly - 2 sets - 2 reps - 30 hold  02/14/2022: - Toe Yoga - Alternating Great Toe and Lesser Toe Extension  - 1 x daily - 7 x weekly - 3 sets - 10 reps - Seated Toe Towel Scrunches  - 2 x daily - 7 x weekly - 2 sets - 10 reps - Toe Spreading  -  2 x daily - 7 x weekly - 2 sets - 10 reps - Seated Arch Lifts  - 2 x daily - 7 x weekly - 2 sets - 10 reps   ASSESSMENT:  CLINICAL IMPRESSION: Pt arrives to PT with significant pain and swelling in her L ankle. She states that she tripped over a pug on Wednesday and twisted her ankle. Session initiated with ankle mobility with pt only able to tolerate ankle pumps. Vasocompression applied to L foot at 38 degrees for 12 min. Effleurage performed to L ankle with tenderness at medial side. Remainder of session with focus on glute and quad activation to maintain L LE activation. Pt departs with surgical boot and reduced swelling.Pt will continue to benefit from skilled PT to address continued deficits.   OBJECTIVE IMPAIRMENTS: decreased activity tolerance, difficulty walking, decreased balance, decreased endurance, decreased mobility, decreased ROM, decreased strength, impaired flexibility, impaired LE use, postural dysfunction, and pain.  ACTIVITY LIMITATIONS: bending, lifting, carry, locomotion, cleaning, community activity, driving, and or occupation  PERSONAL FACTORS: Anxiety, anemia, arthritis in hands/ feet, depression, obesity, hypothyroidism.  are also affecting patient's functional outcome.  REHAB POTENTIAL: Good  CLINICAL DECISION MAKING: Stable/uncomplicated  EVALUATION COMPLEXITY: Low    GOALS: Short term PT Goals Target date: 03/16/2022 Pt will be I and compliant with HEP. Baseline:  Goal status: New Pt will decrease pain by 25% overall Baseline: Goal status: New  Long term PT goals Target date: 04/13/2022 Pt will improve TUG by 3.4 seconds or Minimal clinically important difference.   Baseline  Goal status: New Pt will reduce  pain to overall less than 1-2/10 with usual activity and work activity. Baseline: Goal status: New Pt will be able to ambulate community distances at least 1000 ft WNL gait pattern without complaints Baseline: Goal status: New  PLAN: PT FREQUENCY: 1-2 times per week   PT DURATION: 6-8 weeks  PLANNED INTERVENTIONS (unless contraindicated): aquatic PT, Canalith repositioning, cryotherapy, Electrical stimulation, Iontophoresis with 4 mg/ml dexamethasome, Moist heat, traction, Ultrasound, gait training, Therapeutic exercise, balance training, neuromuscular re-education, patient/family education, prosthetic training, manual techniques, passive ROM, dry needling, taping, vasopnuematic device, vestibular, spinal manipulations, joint manipulations   PLAN FOR NEXT SESSION: Assess HEP/update PRN, continue to progress functional mobility, strengthen foot intrinsics muscles. Decrease patients pain and help minimize functional deficits. Balance, gait.     Lynden Ang, PT 02/16/2022, 9:42 AM

## 2022-02-17 ENCOUNTER — Encounter: Payer: Self-pay | Admitting: Physical Therapy

## 2022-02-17 ENCOUNTER — Ambulatory Visit (INDEPENDENT_AMBULATORY_CARE_PROVIDER_SITE_OTHER): Payer: Medicare Other | Admitting: Physical Therapy

## 2022-02-17 DIAGNOSIS — R262 Difficulty in walking, not elsewhere classified: Secondary | ICD-10-CM | POA: Diagnosis not present

## 2022-02-17 DIAGNOSIS — R2689 Other abnormalities of gait and mobility: Secondary | ICD-10-CM | POA: Diagnosis not present

## 2022-02-17 DIAGNOSIS — R600 Localized edema: Secondary | ICD-10-CM

## 2022-02-17 DIAGNOSIS — M25572 Pain in left ankle and joints of left foot: Secondary | ICD-10-CM | POA: Diagnosis not present

## 2022-02-22 NOTE — Progress Notes (Signed)
Chief Complaint:   OBESITY Vanessa Christensen is here to discuss her progress with her obesity treatment plan along with follow-up of her obesity related diagnoses. Vanessa Christensen is on the Category 3 Plan and states she is following her eating plan approximately 40% of the time. Vanessa Christensen states she is doing 0 minutes 0 times per week.  Today's visit was #: 18 Starting weight: 241 lbs Starting date: 08/12/2020 Today's weight: 197 lbs Today's date: 02/15/2022 Total lbs lost to date: 44 Total lbs lost since last in-office visit: 5  Interim History: Vanessa Christensen is down an additional 5 lbs since her last visit. She is on Wellbutrin and she is dehydrated.   Subjective:   1. Essential hypertension Vanessa Christensen's blood pressure is slightly elevated today.   2. Visceral obesity Vanessa Christensen's visceral fat rating is 15, and normal goal is below 13.  Assessment/Plan:   1. Essential hypertension Vanessa Christensen will continue her medications.   2. Visceral obesity Vanessa Christensen will continue on her meal plan and exercise.   3. Obesity, current BMI 36.0 Vanessa Christensen is currently in the action stage of change. As such, her goal is to continue with weight loss efforts. She has agreed to the Category 3 Plan.   Meal planning and intentional eating were discussed. She will eat soup appropriately and prepaid breakfast options.   Exercise goals: Chair exercises, and soft cardio.   Behavioral modification strategies: increasing lean protein intake, decreasing simple carbohydrates, increasing vegetables, increasing water intake, decreasing eating out, no skipping meals, meal planning and cooking strategies, keeping healthy foods in the home, and planning for success.  Vanessa Christensen has agreed to follow-up with our clinic in 4 weeks. She was informed of the importance of frequent follow-up visits to maximize her success with intensive lifestyle modifications for her multiple health conditions.   Objective:   Blood pressure (!) 149/88, pulse 66, temperature  98.3 F (36.8 C), height '5\' 2"'$  (1.575 m), weight 197 lb (89.4 kg), SpO2 98 %. Body mass index is 36.03 kg/m.  Wearing a brace on her left ankle and using voltain.  Alternating between Tylenol and NSAIDs.  General: Cooperative, alert, well developed, in no acute distress. HEENT: Conjunctivae and lids unremarkable. Cardiovascular: Regular rhythm.  Lungs: Normal work of breathing. Neurologic: No focal deficits.   Lab Results  Component Value Date   CREATININE 0.71 02/08/2022   BUN 17 02/08/2022   NA 138 02/08/2022   K 4.6 02/08/2022   CL 103 02/08/2022   CO2 26 02/08/2022   Lab Results  Component Value Date   ALT 32 02/08/2022   AST 31 02/08/2022   ALKPHOS 63 02/08/2022   BILITOT 0.6 02/08/2022   Lab Results  Component Value Date   HGBA1C 5.2 12/08/2020   HGBA1C 5.6 01/15/2020   HGBA1C 5.4 11/04/2018   HGBA1C 5.6 08/13/2018   Lab Results  Component Value Date   INSULIN 4.7 12/08/2020   INSULIN 8.0 01/15/2020   INSULIN 10.9 11/04/2018   INSULIN 13.2 08/13/2018   Lab Results  Component Value Date   TSH 1.17 02/08/2022   Lab Results  Component Value Date   CHOL 189 12/08/2020   HDL 47 12/08/2020   LDLCALC 127 (H) 12/08/2020   TRIG 80 12/08/2020   CHOLHDL 4 05/08/2018   Lab Results  Component Value Date   VD25OH 32.84 07/27/2021   VD25OH 74.7 12/08/2020   VD25OH 36.9 01/15/2020   Lab Results  Component Value Date   WBC 3.3 (L) 02/08/2022   HGB 13.5 02/08/2022  HCT 39.9 02/08/2022   MCV 85.4 02/08/2022   PLT 139.0 (L) 02/08/2022   Lab Results  Component Value Date   IRON 51 10/25/2021   TIBC 423 10/25/2021   FERRITIN 34 10/25/2021   Attestation Statements:   Reviewed by clinician on day of visit: allergies, medications, problem list, medical history, surgical history, family history, social history, and previous encounter notes.   Wilhemena Durie, am acting as Location manager for [employer], DO.  I have reviewed the above documentation for  accuracy and completeness, and I agree with the above. Jearld Lesch, DO

## 2022-02-23 NOTE — Therapy (Signed)
OUTPATIENT PHYSICAL THERAPY LOWER EXTREMITY EVALUATION   Patient Name: Vanessa Christensen MRN: [id number] DOB:Feb 22, 1951, 71 y.o., female Today's Date: 02/27/2022   PT End of Session - 02/27/22 1209     Visit Number 4    Number of Visits 6    Date for PT Re-Evaluation 04/06/22    PT Start Time 1020    PT Stop Time 1100    PT Time Calculation (min) 40 min               Past Medical History:  Diagnosis Date   Anemia    hx of   Anxiety    pt denies   Arthritis    hands and feet   Cancer ([place]) 10/04/2006   breast   Cataracts, both eyes    Depression    Depression    Diverticulosis of colon    Glaucoma    Heart murmur    No work up done 5 years ago   Joint pain    Knee pain    Obesity    OSA on CPAP    Osteoarthritis    Sleep apnea    no CPAP- no longer needed d/t weight loss   Thyroid activity decreased    Past Surgical History:  Procedure Laterality Date   BREAST SURGERY Bilateral    CATARACT EXTRACTION     COLONOSCOPY     GLAUCOMA REPAIR     MASTECTOMY, RADICAL Bilateral    neulasta induced sterile abscesses     THYROIDECTOMY, PARTIAL     TONSILLECTOMY     TOTAL KNEE ARTHROPLASTY Right    TOTAL KNEE REVISION Right 05/07/2020   Procedure: RIGHT TOTAL KNEE REVISION ARTHROPLASTY;  Surgeon: Mcarthur Rossetti, MD;  Location: [facility];  Service: Orthopedics;  Laterality: Right;   Patient Active Problem List   Diagnosis Date Noted   Stress incontinence 11/08/2021   Unilateral primary osteoarthritis, left knee 12/27/2020   Status post revision of total replacement of right knee 05/07/2020   Failed total knee, right, 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/2020   History of total right knee replacement 01/28/2020   Vitamin D deficiency 08/29/2018   Insulin resistance 08/29/2018   Other specified glaucoma 08/14/2018   Incontinence of urine in female 11/07/2017   Vertigo 11/07/2017   Left knee pain 10/25/2016   Genetic testing 08/08/2016   Hypothyroidism 12/02/2015    Anxiety and depression 12/02/2015   OAB (overactive bladder) 12/02/2015   Breast cancer of upper-outer quadrant of left female breast ([place]) 12/02/2015   Obesity (BMI 30-39.9) 12/02/2015    PCP: [name], MD  REFERRING PROVIDER: Gregor Hams, MD  REFERRING DIAG: Acute left ankle pain  THERAPY DIAG:  Difficulty walking  Other abnormalities of gait and mobility  Pain in left ankle and joints of left foot  Localized edema  Rationale for Evaluation and Treatment Rehabilitation  ONSET DATE: 3 weeks.   SUBJECTIVE:   SUBJECTIVE STATEMENT:  02/27/2022:  Pt reports having a great trip. She states that she did a lot of walking and stairs. She reports using a step to gait pattern with her boot on. Pt reports isolated pain at her distal/ medial achillis insertion.   Pt is a 71 y/o female c/o L ankle pain. She reports that it stated about 3 weeks ago when she was exiting [employer]. Pt reports falling forward due to pain in her L ankle. Pt locates pain to the distal Achilles tendon on medial calcaneous. Pt reports no pain at rest,  but notes pain when she pushes off her foot. She is going on a mission trip to [place] on 02/27/2022 where she will be walking/ standing a lot for 4 days. She denies N/T into L foot.   PERTINENT HISTORY: Anxiety, anemia, arthritis in hands/ feet, depression, obesity, hypothyroidism.   PAIN:  Are you having pain? Yes: NPRS scale: 4/10 at worst, currently 0/10 Pain location: Achillis tendon,  Pain description: Sharp pain Aggravating factors: Walking, stairs, squatting.  Relieving factors: Rest, Voltaren   PRECAUTIONS: None  WEIGHT BEARING RESTRICTIONS No  FALLS:  Has patient fallen in last 6 months? Yes. Number of falls 1  LIVING ENVIRONMENT: Lives with: lives with their spouse Lives in: House/apartment Stairs: Yes: Internal: 12 steps; on right going up and External: 3 steps; bilateral but cannot reach both Has following equipment at  home: None  OCCUPATION: Retired.   PLOF: Independent  PATIENT GOALS Pt would like to be able to walk without pain.   OBJECTIVE:   DIAGNOSTIC FINDINGS: None    COGNITION:  Overall cognitive status: Within functional limits for tasks assessed     SENSATION: WFL  POSTURE: No Significant postural limitations  PALPATION: No tenderness to palpation.   LOWER EXTREMITY ROM:  Active ROM Right eval Left eval  Ankle dorsiflexion 13 10  Ankle plantarflexion 45 45  Ankle inversion 30 30  Ankle eversion 18 15   (Blank rows = not tested)  LOWER EXTREMITY MMT:  MMT Right eval Left eval  Hip flexion 5/5 5/5  Hip abduction    Knee flexion 5/5 5/5  Knee extension 5/5 5/5   (Blank rows = not tested)   FUNCTIONAL TESTS:  Timed up and go (TUG): 18.40 sec   GAIT: Distance walked: 50 ft  Assistive device utilized: None Level of assistance: Complete Independence Comments: Pt walks with antalgic gait with decreased stance time on the L LE and IV.  TODAY'S TREATMENT: [facility] Adult PT Treatment:                                                DATE: 02/27/2022 Therapeutic Exercise: Ankle pumps x30 elevated  Quad set with SLR 2x10  BAPS DF/ DF x20  BASP IV/EV x20  Towel scrunches x30  Toe yoga x20  Add 4 way ankles next session. Manual Therapy: Apjt mobs grave IV Effleurage  Plantar fascia stretch Modalities: Vaso compression 34 degrees, 10 minutes   [facility] Adult PT Treatment:                                                DATE: 02/17/2022 Therapeutic Exercise: Ankle pumps x30 elevated  Quad set with SLR x10  Glute sets  Vaso compression due to significant swelling and bruising in L ankle. Manual:    Effleurage to L ankle to reduce swelling  [facility] Adult PT Treatment:                                                DATE: 02/14/2022 Therapeutic Exercise: Towel scrunches x30  Toe yoga x20  Ankle circles x20 each direction Ankle  pumps x20  Plantar fascia stretch 2x 30  sec Gastroc stretch 2x 30 sec  Add 4 way ankles next session.   PATIENT EDUCATION:  Education details: Educated pt on anatomy and physiology of current symptoms, FOTO, diagnosis, prognosis, HEP,  and POC. Person educated: Patient Education method: Customer service manager Education comprehension: verbalized understanding and returned demonstration   HOME EXERCISE PROGRAM: Access Code: [email or web address] URL: [email or web address] Date: 02/10/2022 Prepared by: Rudi Heap  Exercises - Seated Ankle Circles  - 2 x daily - 7 x weekly - 2 sets - 10 reps - Seated Ankle Pumps  - 2 x daily - 7 x weekly - 2 sets - 10 reps - Gastroc Stretch on Wall  - 2 x daily - 7 x weekly - 2 sets - 2 reps - 30 hold  02/14/2022: - Toe Yoga - Alternating Great Toe and Lesser Toe Extension  - 1 x daily - 7 x weekly - 3 sets - 10 reps - Seated Toe Towel Scrunches  - 2 x daily - 7 x weekly - 2 sets - 10 reps - Toe Spreading  - 2 x daily - 7 x weekly - 2 sets - 10 reps - Seated Arch Lifts  - 2 x daily - 7 x weekly - 2 sets - 10 reps   ASSESSMENT:  CLINICAL IMPRESSION: Pt arrives to PT with decreased swelling in L ankle. She continues to walk with slow antalgic gait pattern and decreased step length. Session with continued focus on ankle mobility and foot intrinsic strengthening. Pt tolerated all prescribed exercises well today with no reported pain. Pt reports mild discomfort with end range DF with ankle pumps. Plan to incorporate 4 way ankles next session. Pt will continue to benefit from skilled PT to address continued deficits.     OBJECTIVE IMPAIRMENTS: decreased activity tolerance, difficulty walking, decreased balance, decreased endurance, decreased mobility, decreased ROM, decreased strength, impaired flexibility, impaired LE use, postural dysfunction, and pain.  ACTIVITY LIMITATIONS: bending, lifting, carry, locomotion, cleaning, community activity, driving, and or occupation  PERSONAL  FACTORS: Anxiety, anemia, arthritis in hands/ feet, depression, obesity, hypothyroidism.  are also affecting patient's functional outcome.  REHAB POTENTIAL: Good  CLINICAL DECISION MAKING: Stable/uncomplicated  EVALUATION COMPLEXITY: Low    GOALS: Short term PT Goals Target date: 03/27/2022 Pt will be I and compliant with HEP. Baseline:  Goal status: New Pt will decrease pain by 25% overall Baseline: Goal status: New  Long term PT goals Target date: 04/24/2022 Pt will improve TUG by 3.4 seconds or Minimal clinically important difference.   Baseline  Goal status: New Pt will reduce pain to overall less than 1-2/10 with usual activity and work activity. Baseline: Goal status: New Pt will be able to ambulate community distances at least 1000 ft WNL gait pattern without complaints Baseline: Goal status: New  PLAN: PT FREQUENCY: 1-2 times per week   PT DURATION: 6-8 weeks  PLANNED INTERVENTIONS (unless contraindicated): aquatic PT, Canalith repositioning, cryotherapy, Electrical stimulation, Iontophoresis with 4 mg/ml dexamethasome, Moist heat, traction, Ultrasound, gait training, Therapeutic exercise, balance training, neuromuscular re-education, patient/family education, prosthetic training, manual techniques, passive ROM, dry needling, taping, vasopnuematic device, vestibular, spinal manipulations, joint manipulations   PLAN FOR NEXT SESSION: Assess HEP/update PRN, continue to progress functional mobility, strengthen foot intrinsics muscles. Decrease patients pain and help minimize functional deficits. Balance, gait.     Lynden Ang, PT 02/27/2022, 12:10 PM

## 2022-02-27 ENCOUNTER — Encounter: Payer: Medicare Other | Admitting: Physical Therapy

## 2022-02-27 ENCOUNTER — Ambulatory Visit (INDEPENDENT_AMBULATORY_CARE_PROVIDER_SITE_OTHER): Payer: Medicare Other | Admitting: Physical Therapy

## 2022-02-27 DIAGNOSIS — R262 Difficulty in walking, not elsewhere classified: Secondary | ICD-10-CM

## 2022-02-27 DIAGNOSIS — R600 Localized edema: Secondary | ICD-10-CM

## 2022-02-27 DIAGNOSIS — M25572 Pain in left ankle and joints of left foot: Secondary | ICD-10-CM | POA: Diagnosis not present

## 2022-02-27 DIAGNOSIS — R2689 Other abnormalities of gait and mobility: Secondary | ICD-10-CM

## 2022-03-01 ENCOUNTER — Ambulatory Visit (INDEPENDENT_AMBULATORY_CARE_PROVIDER_SITE_OTHER): Payer: Medicare Other | Admitting: Physical Therapy

## 2022-03-01 ENCOUNTER — Encounter: Payer: Self-pay | Admitting: Physical Therapy

## 2022-03-01 DIAGNOSIS — R262 Difficulty in walking, not elsewhere classified: Secondary | ICD-10-CM

## 2022-03-01 DIAGNOSIS — M25572 Pain in left ankle and joints of left foot: Secondary | ICD-10-CM

## 2022-03-01 DIAGNOSIS — R2689 Other abnormalities of gait and mobility: Secondary | ICD-10-CM

## 2022-03-01 NOTE — Therapy (Signed)
OUTPATIENT PHYSICAL THERAPY LOWER EXTREMITY EVALUATION   Patient Name: Vanessa Christensen MRN: [id number] DOB:Mar 27, 1951, 71 y.o., female Today's Date: 03/01/2022   PT End of Session - 03/01/22 1018     Visit Number 5    Number of Visits 13    Date for PT Re-Evaluation 04/26/22    PT Start Time 3810    PT Stop Time 1058    PT Time Calculation (min) 39 min               Past Medical History:  Diagnosis Date   Anemia    hx of   Anxiety    pt denies   Arthritis    hands and feet   Cancer ([place]) 10/04/2006   breast   Cataracts, both eyes    Depression    Depression    Diverticulosis of colon    Glaucoma    Heart murmur    No work up done 5 years ago   Joint pain    Knee pain    Obesity    OSA on CPAP    Osteoarthritis    Sleep apnea    no CPAP- no longer needed d/t weight loss   Thyroid activity decreased    Past Surgical History:  Procedure Laterality Date   BREAST SURGERY Bilateral    CATARACT EXTRACTION     COLONOSCOPY     GLAUCOMA REPAIR     MASTECTOMY, RADICAL Bilateral    neulasta induced sterile abscesses     THYROIDECTOMY, PARTIAL     TONSILLECTOMY     TOTAL KNEE ARTHROPLASTY Right    TOTAL KNEE REVISION Right 05/07/2020   Procedure: RIGHT TOTAL KNEE REVISION ARTHROPLASTY;  Surgeon: Mcarthur Rossetti, MD;  Location: [facility];  Service: Orthopedics;  Laterality: Right;   Patient Active Problem List   Diagnosis Date Noted   Stress incontinence 11/08/2021   Unilateral primary osteoarthritis, left knee 12/27/2020   Status post revision of total replacement of right knee 05/07/2020   Failed total knee, right, 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/2020   History of total right knee replacement 01/28/2020   Vitamin D deficiency 08/29/2018   Insulin resistance 08/29/2018   Other specified glaucoma 08/14/2018   Incontinence of urine in female 11/07/2017   Vertigo 11/07/2017   Left knee pain 10/25/2016   Genetic testing 08/08/2016   Hypothyroidism 12/02/2015    Anxiety and depression 12/02/2015   OAB (overactive bladder) 12/02/2015   Breast cancer of upper-outer quadrant of left female breast ([place]) 12/02/2015   Obesity (BMI 30-39.9) 12/02/2015    PCP: [name], MD  REFERRING PROVIDER: Gregor Hams, MD  REFERRING DIAG: Acute left ankle pain  THERAPY DIAG:  Difficulty walking - Plan: PT plan of care cert/re-cert  Pain in left ankle and joints of left foot - Plan: PT plan of care cert/re-cert  Other abnormalities of gait and mobility - Plan: PT plan of care cert/re-cert  Rationale for Evaluation and Treatment Rehabilitation  ONSET DATE: 3 weeks.   SUBJECTIVE:   SUBJECTIVE STATEMENT:  03/01/2022:  States her ankle does good and bad. States she still wears her brace at times. States that her ankle was really bothering her the other day and she was wearing her brace. States that when she stretches it feels good. States she uses the brace when she has to go out to church and out to the grocery store. Patient reports overall she feels about 65% better, but some days it feels great.  Pt is  a 71 y/o female c/o L ankle pain. She reports that it stated about 3 weeks ago when she was exiting [employer]. Pt reports falling forward due to pain in her L ankle. Pt locates pain to the distal Achilles tendon on medial calcaneous. Pt reports no pain at rest, but notes pain when she pushes off her foot. She is going on a mission trip to [place] on 02/27/2022 where she will be walking/ standing a lot for 4 days. She denies N/T into L foot.   PERTINENT HISTORY: Anxiety, anemia, arthritis in hands/ feet, depression, obesity, hypothyroidism.   PAIN:  Are you having pain? Yes: NPRS scale: currently 4/10 Pain location: Achillis tendon,  Pain description: Sharp pain Aggravating factors: Walking, stairs, squatting.  Relieving factors: Rest, Voltaren   PRECAUTIONS: None  WEIGHT BEARING RESTRICTIONS No  FALLS:  Has patient fallen in last 6  months? Yes. Number of falls 1  LIVING ENVIRONMENT: Lives with: lives with their spouse Lives in: House/apartment Stairs: Yes: Internal: 12 steps; on right going up and External: 3 steps; bilateral but cannot reach both Has following equipment at home: None  OCCUPATION: Retired.   PLOF: Independent  PATIENT GOALS Pt would like to be able to walk without pain.   OBJECTIVE:   DIAGNOSTIC FINDINGS: None    COGNITION:  Overall cognitive status: Within functional limits for tasks assessed     SENSATION: WFL  POSTURE: No Significant postural limitations  PALPATION: No tenderness to palpation.   LOWER EXTREMITY ROM:  Active ROM Right 9/27 Left 9/27  Ankle dorsiflexion 13 10  Ankle plantarflexion 50 45*  Ankle inversion 30 25  Ankle eversion 18 12   (Blank rows = not tested) *pain  LOWER EXTREMITY MMT:  MMT Right 9/27 Left 9/27  Hip flexion 5/5 5/5  Hip abduction    Knee flexion 5/5 5/5  Knee extension 5/5 5/5   (Blank rows = not tested)   FUNCTIONAL TESTS:  Timed up and go (TUG): 11.01 sec   GAIT: Distance walked: 50 ft  Assistive device utilized: None Level of assistance: Complete Independence Comments: Pt walks with antalgic gait with decreased stance time on the L LE and IV.  TODAY'S TREATMENT: 03/01/2022  Therapeutic Exercise:  Aerobic: seated: 4 way ankle in all directions - x20 L each Prone:  Seated: calf stretch x3 30" holds, 4 way ankle red band x20 each Left  Standing: Neuromuscular Re-education: Manual Therapy: percussion gun for vibration to left ankle/foot. PROM/mobilizations to hind foot of left leg Therapeutic Activity: Self Care: Trigger Point Dry Needling:  Modalities:    PATIENT EDUCATION:  Education details: on current presentation, on HEP, on progress and anatomy, on plan moving forward Person educated: Patient Education method: Customer service manager Education comprehension: verbalized understanding and returned  demonstration   HOME EXERCISE PROGRAM: Access Code: [email or web address] URL: [email or web address] Date: 02/10/2022 Prepared by: Rudi Heap      ASSESSMENT:  CLINICAL IMPRESSION:  Patient has met 1/2 short term goals 2/3 long term goals at this time. Overall she has progressed in function but still ambulates with a limp and has pain with motion and weakness. Extending POC to continue to work on current above limitations.     OBJECTIVE IMPAIRMENTS: decreased activity tolerance, difficulty walking, decreased balance, decreased endurance, decreased mobility, decreased ROM, decreased strength, impaired flexibility, impaired LE use, postural dysfunction, and pain.  ACTIVITY LIMITATIONS: bending, lifting, carry, locomotion, cleaning, community activity, driving, and or occupation  PERSONAL FACTORS: Anxiety, anemia, arthritis in hands/  feet, depression, obesity, hypothyroidism.  are also affecting patient's functional outcome.  REHAB POTENTIAL: Good  CLINICAL DECISION MAKING: Stable/uncomplicated  EVALUATION COMPLEXITY: Low    GOALS: Short term PT Goals Target date: 03/29/2022 Pt will be I and compliant with HEP. Baseline:  Goal status: PROGRESSING Pt will decrease pain by 25% overall Baseline: Goal status: MET  Long term PT goals Target date: 04/26/2022 Pt will improve TUG by 3.4 seconds or Minimal clinically important difference.   Baseline  Goal status: MET Pt will reduce pain to overall less than 1-2/10 with usual activity and work activity. Baseline: Goal status: MET Pt will be able to ambulate community distances at least 1000 ft WNL gait pattern without complaints Baseline: Goal status: PROGRESSING - still limping   PLAN: PT FREQUENCY: 1 time per week   PT DURATION: 8 weeks  PLANNED INTERVENTIONS (unless contraindicated): aquatic PT, Canalith repositioning, cryotherapy, Electrical stimulation, Iontophoresis with 4 mg/ml dexamethasome, Moist heat, traction,  Ultrasound, gait training, Therapeutic exercise, balance training, neuromuscular re-education, patient/family education, prosthetic training, manual techniques, passive ROM, dry needling, taping, vasopnuematic device, vestibular, spinal manipulations, joint manipulations   PLAN FOR NEXT SESSION: extending POC additional 8 weeks at 1x/week,continue with ROM strength, function and ROM     11:52 AM, 03/01/22 Jerene Pitch, DPT Physical Therapy with [facility]

## 2022-03-02 ENCOUNTER — Other Ambulatory Visit: Payer: Self-pay | Admitting: Family Medicine

## 2022-03-08 ENCOUNTER — Ambulatory Visit (INDEPENDENT_AMBULATORY_CARE_PROVIDER_SITE_OTHER): Payer: Medicare Other | Admitting: Family Medicine

## 2022-03-08 ENCOUNTER — Ambulatory Visit: Payer: Medicare Other | Admitting: Family Medicine

## 2022-03-08 VITALS — BP 168/94 | HR 76 | Ht 62.0 in | Wt 207.0 lb

## 2022-03-08 DIAGNOSIS — M25572 Pain in left ankle and joints of left foot: Secondary | ICD-10-CM | POA: Diagnosis not present

## 2022-03-08 DIAGNOSIS — M25562 Pain in left knee: Secondary | ICD-10-CM

## 2022-03-08 DIAGNOSIS — G8929 Other chronic pain: Secondary | ICD-10-CM | POA: Diagnosis not present

## 2022-03-08 NOTE — Progress Notes (Signed)
   I, Peterson Lombard, LAT, ATC acting as a scribe for [name], MD.  Vanessa Christensen is a 71 y.o. female who presents to [place] at [facility] today for f/u L ankle pain. Pt was on a mission trip to [place] 9/18 where she was on her feet working a lot. Pt was last seen by Dr. [name] on 01/25/22 and was advised to use good arch support, Voltaren gel, and eccentric exercises focused on posterior tibialis tendinitis. She was advised if struggling while on her trip to use a CAM walker boot. Pt was also referred to PT, completing 5 visits. Today, pt reports L ankle is feeling much better. Pt notes slight soreness over the distal Achilles tendon. Pt has developed some L knee pain that she's been treating w/ Voltaren gel and ice.    Pertinent review of systems: No fevers or chills  Relevant historical information: Right knee replacement   Exam:  BP (!) 168/94   Pulse 76   Ht '5\' 2"'$  (1.575 m)   Wt 207 lb (93.9 kg)   SpO2 97%   BMI 37.86 kg/m  General: Well Developed, well nourished, and in no acute distress.   MSK: Left knee normal-appearing normal motion with crepitation.  Tender palpation medial joint line.  Left ankle mild swelling posterior calcaneus.  Normal ankle motion.  Nontender stable ligamentous exam  Mild antalgic gait    Lab and Radiology Results  X-ray images left knee obtained February 2023 personally independently interpreted Moderate to severe medial compartment DJD and moderate patellofemoral DJD present.   Assessment and Plan: 71 y.o. female with left knee pain due to DJD.  Continue quad strengthening exercises with physical therapy and Voltaren gel.  Consider steroid injection whenever she is ready to have 1.  Left ankle pain thought to be due to degenerative changes and tendinitis.  Improving with physical therapy.  Continue physical therapy and home exercise program.  Check back as needed.  Cautions and check back indications  reviewed. Total encounter time 20 minutes including face-to-face time with the patient and, reviewing past medical record, and charting on the date of service.      Discussed warning signs or symptoms. Please see discharge instructions. Patient expresses understanding.   The above documentation has been reviewed and is accurate and complete [name], M.D.

## 2022-03-08 NOTE — Patient Instructions (Addendum)
Thank you for coming in today.   Recheck as needed.   Continue home exercises.

## 2022-03-10 ENCOUNTER — Ambulatory Visit (INDEPENDENT_AMBULATORY_CARE_PROVIDER_SITE_OTHER): Payer: Medicare Other | Admitting: Family Medicine

## 2022-03-10 ENCOUNTER — Encounter: Payer: Self-pay | Admitting: Family Medicine

## 2022-03-10 VITALS — BP 122/70 | HR 71 | Temp 97.6°F | Resp 17 | Ht 62.0 in | Wt 206.4 lb

## 2022-03-10 DIAGNOSIS — F32A Depression, unspecified: Secondary | ICD-10-CM

## 2022-03-10 DIAGNOSIS — F419 Anxiety disorder, unspecified: Secondary | ICD-10-CM

## 2022-03-10 NOTE — Patient Instructions (Signed)
Follow up in 3 months to recheck mood and memory No med changes at this time Call with any questions or concerns Stay Safe!  Stay Healthy! KEEP UP THE GOOD WORK!!!

## 2022-03-10 NOTE — Assessment & Plan Note (Signed)
Much improved w/ addition of Wellbutrin XL '150mg'$  daily.  No longer having dark thoughts.  No longer having panic.  Husband confirms that things are much better.  No changes at this time.  Will follow.

## 2022-03-10 NOTE — Progress Notes (Signed)
   Subjective:    Patient ID: Vanessa Christensen, female    DOB: 1951-02-08, 71 y.o.   MRN: [id number]  HPI Anxiety/Depression- at last visit we added Wellbutrin '150mg'$  dialy in addition to her Fluoxetine '40mg'$  daily.  Husband reports 'it's working'.  'it's so nice to wake up in the morning and not think bad thoughts about yourself'.  No longer having dark thoughts, no longer having panic.  Memory is unchanged.  Husband and pt both agree that she is more like herself.  Not interested in changing meds   Review of Systems For ROS see HPI     Objective:   Physical Exam Vitals reviewed.  Constitutional:      General: She is not in acute distress.    Appearance: Normal appearance. She is not ill-appearing.  HENT:     Head: Normocephalic and atraumatic.  Eyes:     Extraocular Movements: Extraocular movements intact.     Conjunctiva/sclera: Conjunctivae normal.     Pupils: Pupils are equal, round, and reactive to light.  Cardiovascular:     Rate and Rhythm: Normal rate and regular rhythm.  Pulmonary:     Effort: Pulmonary effort is normal. No respiratory distress.  Skin:    General: Skin is warm and dry.  Neurological:     General: No focal deficit present.     Mental Status: She is alert and oriented to person, place, and time.  Psychiatric:        Mood and Affect: Mood normal.        Behavior: Behavior normal.        Thought Content: Thought content normal.           Assessment & Plan:

## 2022-03-11 ENCOUNTER — Other Ambulatory Visit (INDEPENDENT_AMBULATORY_CARE_PROVIDER_SITE_OTHER): Payer: Self-pay | Admitting: Bariatrics

## 2022-03-11 DIAGNOSIS — N393 Stress incontinence (female) (male): Secondary | ICD-10-CM

## 2022-03-14 ENCOUNTER — Ambulatory Visit (INDEPENDENT_AMBULATORY_CARE_PROVIDER_SITE_OTHER): Payer: Medicare Other | Admitting: Physical Therapy

## 2022-03-14 ENCOUNTER — Encounter: Payer: Self-pay | Admitting: Physical Therapy

## 2022-03-14 DIAGNOSIS — M25572 Pain in left ankle and joints of left foot: Secondary | ICD-10-CM | POA: Diagnosis not present

## 2022-03-14 DIAGNOSIS — R2689 Other abnormalities of gait and mobility: Secondary | ICD-10-CM

## 2022-03-14 DIAGNOSIS — R262 Difficulty in walking, not elsewhere classified: Secondary | ICD-10-CM | POA: Diagnosis not present

## 2022-03-14 NOTE — Therapy (Signed)
OUTPATIENT PHYSICAL THERAPY LOWER EXTREMITY EVALUATION   Patient Name: Vanessa Christensen MRN: [id number] DOB:November 16, 1950, 71 y.o., female Today's Date: 03/14/2022   PT End of Session - 03/14/22 1018     Visit Number 6    Number of Visits 13    Date for PT Re-Evaluation 04/26/22    PT Start Time 5170    PT Stop Time 1059    PT Time Calculation (min) 38 min               Past Medical History:  Diagnosis Date   Anemia    hx of   Anxiety    pt denies   Arthritis    hands and feet   Cancer ([place]) 10/04/2006   breast   Cataracts, both eyes    Depression    Depression    Diverticulosis of colon    Glaucoma    Heart murmur    No work up done 5 years ago   Joint pain    Knee pain    Obesity    OSA on CPAP    Osteoarthritis    Sleep apnea    no CPAP- no longer needed d/t weight loss   Thyroid activity decreased    Past Surgical History:  Procedure Laterality Date   BREAST SURGERY Bilateral    CATARACT EXTRACTION     COLONOSCOPY     GLAUCOMA REPAIR     MASTECTOMY, RADICAL Bilateral    neulasta induced sterile abscesses     THYROIDECTOMY, PARTIAL     TONSILLECTOMY     TOTAL KNEE ARTHROPLASTY Right    TOTAL KNEE REVISION Right 05/07/2020   Procedure: RIGHT TOTAL KNEE REVISION ARTHROPLASTY;  Surgeon: Mcarthur Rossetti, MD;  Location: [facility];  Service: Orthopedics;  Laterality: Right;   Patient Active Problem List   Diagnosis Date Noted   Stress incontinence 11/08/2021   Unilateral primary osteoarthritis, left knee 12/27/2020   Status post revision of total replacement of right knee 05/07/2020   Failed total knee, right, 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/2020   History of total right knee replacement 01/28/2020   Vitamin D deficiency 08/29/2018   Insulin resistance 08/29/2018   Other specified glaucoma 08/14/2018   Incontinence of urine in female 11/07/2017   Vertigo 11/07/2017   Left knee pain 10/25/2016   Genetic testing 08/08/2016   Hypothyroidism 12/02/2015    Anxiety and depression 12/02/2015   OAB (overactive bladder) 12/02/2015   Breast cancer of upper-outer quadrant of left female breast ([place]) 12/02/2015   Obesity (BMI 30-39.9) 12/02/2015    PCP: [name], MD  REFERRING PROVIDER: Gregor Hams, MD  REFERRING DIAG: Acute left ankle pain  THERAPY DIAG:  Difficulty walking  Pain in left ankle and joints of left foot  Other abnormalities of gait and mobility  Rationale for Evaluation and Treatment Rehabilitation  ONSET DATE: 3 weeks.   SUBJECTIVE:   SUBJECTIVE STATEMENT:  03/14/2022:  States she is only wearing the brace when she is going to the gym or long distance walks. Overall her foot is doing well. States occasional pain and foot circles are better.   Pt is a 71 y/o female c/o L ankle pain. She reports that it stated about 3 weeks ago when she was exiting [employer]. Pt reports falling forward due to pain in her L ankle. Pt locates pain to the distal Achilles tendon on medial calcaneous. Pt reports no pain at rest, but notes pain when she pushes off her foot. She  is going on a mission trip to [place] on 02/27/2022 where she will be walking/ standing a lot for 4 days. She denies N/T into L foot.   PERTINENT HISTORY: Anxiety, anemia, arthritis in hands/ feet, depression, obesity, hypothyroidism.   PAIN:  Are you having pain? Yes: NPRS scale: currently 0/10 Pain location: Achillis tendon,  Pain description: Sharp pain Aggravating factors: Walking, stairs, squatting.  Relieving factors: Rest, Voltaren   PRECAUTIONS: None  WEIGHT BEARING RESTRICTIONS No  FALLS:  Has patient fallen in last 6 months? Yes. Number of falls 1  LIVING ENVIRONMENT: Lives with: lives with their spouse Lives in: House/apartment Stairs: Yes: Internal: 12 steps; on right going up and External: 3 steps; bilateral but cannot reach both Has following equipment at home: None  OCCUPATION: Retired.   PLOF:  Independent  PATIENT GOALS Pt would like to be able to walk without pain.   OBJECTIVE:   DIAGNOSTIC FINDINGS: None    COGNITION:  Overall cognitive status: Within functional limits for tasks assessed     SENSATION: WFL  POSTURE: No Significant postural limitations  PALPATION: No tenderness to palpation.   LOWER EXTREMITY ROM:  Active ROM Right 9/27 Left 9/27  Ankle dorsiflexion 13 10  Ankle plantarflexion 50 45*  Ankle inversion 30 25  Ankle eversion 18 12   (Blank rows = not tested) *pain  LOWER EXTREMITY MMT:  MMT Right 9/27 Left 9/27  Hip flexion 5/5 5/5  Hip abduction    Knee flexion 5/5 5/5  Knee extension 5/5 5/5   (Blank rows = not tested)   FUNCTIONAL TESTS:  Timed up and go (TUG): 11.01 sec   GAIT: Distance walked: 50 ft  Assistive device utilized: None Level of assistance: Complete Independence Comments: Pt walks with antalgic gait with decreased stance time on the L LE and IV.  TODAY'S TREATMENT: 03/14/2022 Therapeutic Exercise:  Aerobic: seated: ankle inversion and eversion x3 30 with focus on reduced knee motion. 4 way ankle in all directions - x20 L each, heel raise with ankle inversion into ball x30 B Prone:  Seated:    Standing: Neuromuscular Re-education: Manual Therapy: edema management to right ankle - tolerated well -reduced pain with ROM afterwards Therapeutic Activity: Self Care: Trigger Point Dry Needling:  Modalities:    PATIENT EDUCATION:  Education details: on HEP, on compression socks, on elevation, on self edema massage. On how to don/doff compression socks Person educated: Patient Education method: Explanation and Demonstration Education comprehension: verbalized understanding and returned demonstration   HOME EXERCISE PROGRAM: Access Code: [email or web address] URL: [email or web address] Date: 02/10/2022 Prepared by: Rudi Heap      ASSESSMENT:  CLINICAL IMPRESSION:  Overall patient is doing well.  Continued swelling in ankle joint but this reduced with edema massage and improved pain-free ROM noted afterwards. Educated patient in self massage and strategies to help with swelling including use of compression stocking and how to don/doff compression stocking. Answered all questions and will continue with current POC as tolerated.     OBJECTIVE IMPAIRMENTS: decreased activity tolerance, difficulty walking, decreased balance, decreased endurance, decreased mobility, decreased ROM, decreased strength, impaired flexibility, impaired LE use, postural dysfunction, and pain.  ACTIVITY LIMITATIONS: bending, lifting, carry, locomotion, cleaning, community activity, driving, and or occupation  PERSONAL FACTORS: Anxiety, anemia, arthritis in hands/ feet, depression, obesity, hypothyroidism.  are also affecting patient's functional outcome.  REHAB POTENTIAL: Good  CLINICAL DECISION MAKING: Stable/uncomplicated  EVALUATION COMPLEXITY: Low    GOALS: Short term PT Goals Target date: 04/11/2022 Pt will  be I and compliant with HEP. Baseline:  Goal status: PROGRESSING Pt will decrease pain by 25% overall Baseline: Goal status: MET  Long term PT goals Target date: 05/09/2022 Pt will improve TUG by 3.4 seconds or Minimal clinically important difference.   Baseline  Goal status: MET Pt will reduce pain to overall less than 1-2/10 with usual activity and work activity. Baseline: Goal status: MET Pt will be able to ambulate community distances at least 1000 ft WNL gait pattern without complaints Baseline: Goal status: PROGRESSING - still limping   PLAN: PT FREQUENCY: 1 time per week   PT DURATION: 8 weeks  PLANNED INTERVENTIONS (unless contraindicated): aquatic PT, Canalith repositioning, cryotherapy, Electrical stimulation, Iontophoresis with 4 mg/ml dexamethasome, Moist heat, traction, Ultrasound, gait training, Therapeutic exercise, balance training, neuromuscular re-education,  patient/family education, prosthetic training, manual techniques, passive ROM, dry needling, taping, vasopnuematic device, vestibular, spinal manipulations, joint manipulations   PLAN FOR NEXT SESSION: extending POC additional 8 weeks at 1x/week,continue with ROM strength, function and ROM     11:16 AM, 03/14/22 Jerene Pitch, DPT Physical Therapy with [facility] Sinner

## 2022-03-15 ENCOUNTER — Encounter (INDEPENDENT_AMBULATORY_CARE_PROVIDER_SITE_OTHER): Payer: Self-pay | Admitting: Family Medicine

## 2022-03-15 ENCOUNTER — Ambulatory Visit: Payer: Medicare Other | Admitting: Bariatrics

## 2022-03-15 ENCOUNTER — Telehealth (INDEPENDENT_AMBULATORY_CARE_PROVIDER_SITE_OTHER): Payer: Medicare Other | Admitting: Family Medicine

## 2022-03-15 DIAGNOSIS — N393 Stress incontinence (female) (male): Secondary | ICD-10-CM | POA: Diagnosis not present

## 2022-03-15 DIAGNOSIS — E88819 Insulin resistance, unspecified: Secondary | ICD-10-CM

## 2022-03-15 DIAGNOSIS — E669 Obesity, unspecified: Secondary | ICD-10-CM | POA: Diagnosis not present

## 2022-03-15 DIAGNOSIS — Z6836 Body mass index (BMI) 36.0-36.9, adult: Secondary | ICD-10-CM | POA: Diagnosis not present

## 2022-03-15 DIAGNOSIS — Z6841 Body Mass Index (BMI) 40.0 and over, adult: Secondary | ICD-10-CM

## 2022-03-15 MED ORDER — SOLIFENACIN SUCCINATE 5 MG PO TABS: 5.0000 mg | ORAL_TABLET | Freq: Every day | ORAL | 0 refills | Status: DC

## 2022-03-15 NOTE — Progress Notes (Signed)
TeleHealth Visit:  This visit was completed with telemedicine (audio/video) technology. [name] has verbally consented to this TeleHealth visit. The patient is located at home, the provider is located at home. The participants in this visit include the listed provider and patient. The visit was conducted today via MyChart video.  OBESITY Vanessa Christensen is here to discuss her progress with her obesity treatment plan along with follow-up of her obesity related diagnoses.   Today's visit was # 35 Starting weight: 241 lbs Starting date: 08/12/2020 Weight at last in office visit: 197 lbs on 02/15/22 Total weight loss: 44 lbs at last in office visit on 02/15/22. Today's reported weight: No weight reported.  Nutrition Plan: the Category 3 Plan.  Poor adherence.  Current exercise:  gym 4 times per week  Interim History: Vanessa Christensen is not feeling well today so she switched to a virtual visit. She has lost to a low weight of 189 lbs with [name] but has regained to 197 lbs.   She eats out several times per week- [employer], [employer], [employer], etc. Tries to make good choices. Usually has chicken skewers and salad at [employer]. Has low cal dressing at [employer] but no protein on salad, orders the vegetarian salad.  When she eats breakfast out she gets a veggie omelette, grits, toast.  She has questions about taking Golo.  A friend of hers has lost 40 pounds taking it.   Assessment/Plan:  1. Stress Incontinence Fairly well controlled with Vesicare 5 mg daily.  Plan:  Refill Vesicare 5 mg daily.  2. Insulin Resistance She has mild insulin resistance.  Fasting insulin at her first office visit was 13.2.  Last fasting insulin was 5.2 on 12/08/2020. Medication(s): None Lab Results  Component Value Date   HGBA1C 5.2 12/08/2020   Lab Results  Component Value Date   INSULIN 4.7 12/08/2020   INSULIN 8.0 01/15/2020   INSULIN 10.9 11/04/2018   INSULIN 13.2 08/13/2018    Plan Discussed Golo, its active  ingredient is berberine.  Told her this would be fine to take. Also discussed metformin and she will do some research about this.   3. Obesity: Current BMI 36.02 Vanessa Christensen is currently in the action stage of change. As such, her goal is to continue with weight loss efforts.  She has agreed to the Category 3 Plan.   We also discussed journaling briefly but she wants to speak with Dr. [name] about this.  Advised her calorie goal would be 15 to 1600/day and protein goal would be 90 g/day.  Told her about the lose it app.  Exercise goals:  as is  Behavioral modification strategies: increasing lean protein intake, decreasing simple carbohydrates, decreasing eating out, meal planning and cooking strategies, and planning for success.  Vanessa Christensen has agreed to follow-up with our clinic in 4 weeks.   No orders of the defined types were placed in this encounter.   Medications Discontinued During This Encounter  Medication Reason   solifenacin (VESICARE) 5 MG tablet Reorder     Meds ordered this encounter  Medications   solifenacin (VESICARE) 5 MG tablet    Sig: Take 1 tablet (5 mg total) by mouth daily.    Dispense:  90 tablet    Refill:  0    Order Specific Question:   Supervising Provider    Answer:   Dell Ponto [[id number]]      Objective:   VITALS: Per patient if applicable, see vitals. GENERAL: Alert and in no acute distress. CARDIOPULMONARY: No  increased WOB. Speaking in clear sentences.  PSYCH: Pleasant and cooperative. Speech normal rate and rhythm. Affect is appropriate. Insight and judgement are appropriate. Attention is focused, linear, and appropriate.  NEURO: Oriented as arrived to appointment on time with no prompting.   Lab Results  Component Value Date   CREATININE 0.71 02/08/2022   BUN 17 02/08/2022   NA 138 02/08/2022   K 4.6 02/08/2022   CL 103 02/08/2022   CO2 26 02/08/2022   Lab Results  Component Value Date   ALT 32 02/08/2022   AST 31 02/08/2022   ALKPHOS 63  02/08/2022   BILITOT 0.6 02/08/2022   Lab Results  Component Value Date   HGBA1C 5.2 12/08/2020   HGBA1C 5.6 01/15/2020   HGBA1C 5.4 11/04/2018   HGBA1C 5.6 08/13/2018   Lab Results  Component Value Date   INSULIN 4.7 12/08/2020   INSULIN 8.0 01/15/2020   INSULIN 10.9 11/04/2018   INSULIN 13.2 08/13/2018   Lab Results  Component Value Date   TSH 1.17 02/08/2022   Lab Results  Component Value Date   CHOL 189 12/08/2020   HDL 47 12/08/2020   LDLCALC 127 (H) 12/08/2020   TRIG 80 12/08/2020   CHOLHDL 4 05/08/2018   Lab Results  Component Value Date   WBC 3.3 (L) 02/08/2022   HGB 13.5 02/08/2022   HCT 39.9 02/08/2022   MCV 85.4 02/08/2022   PLT 139.0 (L) 02/08/2022   Lab Results  Component Value Date   IRON 51 10/25/2021   TIBC 423 10/25/2021   FERRITIN 34 10/25/2021   Lab Results  Component Value Date   VD25OH 32.84 07/27/2021   VD25OH 74.7 12/08/2020   VD25OH 36.9 01/15/2020    Attestation Statements:   Reviewed by clinician on day of visit: allergies, medications, problem list, medical history, surgical history, family history, social history, and previous encounter notes.

## 2022-03-20 ENCOUNTER — Ambulatory Visit (INDEPENDENT_AMBULATORY_CARE_PROVIDER_SITE_OTHER): Payer: Medicare Other | Admitting: Neurology

## 2022-03-20 ENCOUNTER — Encounter: Payer: Self-pay | Admitting: Neurology

## 2022-03-20 VITALS — BP 173/90 | HR 68 | Ht 62.0 in | Wt 205.5 lb

## 2022-03-20 DIAGNOSIS — R030 Elevated blood-pressure reading, without diagnosis of hypertension: Secondary | ICD-10-CM | POA: Diagnosis not present

## 2022-03-20 DIAGNOSIS — R419 Unspecified symptoms and signs involving cognitive functions and awareness: Secondary | ICD-10-CM

## 2022-03-20 NOTE — Patient Instructions (Addendum)
Continue current medications Check you blood pressure at home and provide the readings to your PCP Follow with PCP Return as needed   There are well-accepted and sensible ways to reduce risk for Alzheimers disease and other degenerative brain disorders .  Exercise Daily Walk A daily 20 minute walk should be part of your routine. Disease related apathy can be a significant roadblock to exercise and the only way to overcome this is to make it a daily routine and perhaps have a reward at the end (something your loved one loves to eat or drink perhaps) or a personal trainer coming to the home can also be very useful. Most importantly, the patient is much more likely to exercise if the caregiver / spouse does it with him/her. In general a structured, repetitive schedule is best.  General Health: Any diseases which effect your body will effect your brain such as a pneumonia, urinary infection, blood clot, heart attack or stroke. Keep contact with your primary care doctor for regular follow ups.  Sleep. A good nights sleep is healthy for the brain. Seven hours is recommended. If you have insomnia or poor sleep habits we can give you some instructions. If you have sleep apnea wear your mask.  Diet: Eating a heart healthy diet is also a good idea; fish and poultry instead of red meat, nuts (mostly non-peanuts), vegetables, fruits, olive oil or canola oil (instead of butter), minimal salt (use other spices to flavor foods), whole grain rice, bread, cereal and pasta and wine in moderation.Research is now showing that the MIND diet, which is a combination of The Mediterranean diet and the DASH diet, is beneficial for cognitive processing and longevity. Information about this diet can be found in The MIND Diet, a book by Doyne Keel, MS, RDN, and online at NotebookDistributors.si  Finances, Power of Attorney and Advance Directives: You should consider putting legal safeguards in place with  regard to financial and medical decision making. While the spouse always has power of attorney for medical and financial issues in the absence of any form, you should consider what you want in case the spouse / caregiver is no longer around or capable of making decisions.

## 2022-03-20 NOTE — Progress Notes (Signed)
[facility]  PATIENT: Vanessa Christensen DOB: 1950/08/12  REQUESTING CLINICIAN: [name], MD HISTORY FROM: Patient and Husband  REASON FOR VISIT: Memory problem/Words finding difficulty    HISTORICAL  CHIEF COMPLAINT:  Chief Complaint  Patient presents with   New Patient (Initial Visit)    Rm 12. Accompanied by spouse. NX Jessica LV 2021/internal referral for memory loss.    HISTORY OF PRESENT ILLNESS:  This is a 71 year old woman past medical history of anxiety depression, hypothyroidism and insomnia who is presenting with memory problem. Patient described the problem as word finding difficulty but this has been going on for at least more than 10 years.  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  Sometimes she will forget names of stores, and will mix that name of grandkids.  Due to the anxiety of forgetting names, she is more withdrawn, and she is afraid to say anything during social gathering.  Other than that she lives with her husband, still cook and clean, take care of herself, denies burning pots or leaving the stove on.  She reports having trouble with sense of direction and this is not new so sometimes she will call her husband for help. She does exercise 5 days a week.     TBI:   No past history of TBI Stroke:   no past history of stroke Seizures:   no past history of seizures Sleep: Previous history of sleep apnea but since losing weight no longer needed.  Mood: Has anxiety and Depression and Wellbutrin is helping  Family history of Dementia: Mother with Dementia in her early 86 s  Functional status: independent in all ADLs and IADLs Patient lives with husband. Cooking: yes  Cleaning: yes  Shopping: yes  Bathing: patient  Toileting: patient  Driving: Yes Bills: Husband  Medications: No reminder  Ever left the stove on by accident?: No  Forget how to  use items around the house?: Denies  Getting lost going to familiar places?: Yes Forgetting loved ones names?: Denies  Word finding difficulty? Yes  Sleep: Usually well, takes Trazodone    OTHER MEDICAL CONDITIONS: Sleep trouble    REVIEW OF SYSTEMS: Full 14 system review of systems performed and negative with exception of: Anxiety/depression, Sleep apnea in remission, hypothyroidism, insomnia.  ALLERGIES: Allergies  Allergen Reactions   Neulasta [Pegfilgrastim] Other (See Comments)    Raised lumps on legs, arms - had to be excised =  Happened every time pt received Neulasta.    HOME MEDICATIONS: Outpatient Medications Prior to Visit  Medication Sig Dispense Refill   acetaminophen (TYLENOL) 500 MG tablet Take 1,000 mg by mouth every 6 (six) hours as needed (pain).     Ascorbic Acid (VITAMIN C PO) Take 1 tablet by mouth daily.     aspirin EC 81 MG tablet Take 81 mg by mouth daily. Swallow whole.     B Complex-C (SUPER B COMPLEX PO) Take 1 tablet by mouth daily after supper.     buPROPion (WELLBUTRIN XL) 150 MG 24 hr tablet TAKE 1 TABLET BY MOUTH EVERY DAY 90 tablet 2   FLUoxetine (PROZAC) 40 MG capsule TAKE 1 CAPSULE (40 MG TOTAL) BY MOUTH DAILY. 90 capsule 3   levothyroxine (SYNTHROID) 75 MCG tablet TAKE 1 TABLET BY MOUTH EVERY DAY 90 tablet 1   psyllium (METAMUCIL) 58.6 % powder Take 1 packet by mouth daily.  solifenacin (VESICARE) 5 MG tablet Take 1 tablet (5 mg total) by mouth daily. 90 tablet 0   traZODone (DESYREL) 50 MG tablet TAKE 1/2 TO 1 TABLET BY MOUTH AT BEDTIME AS NEEDED FOR SLEEP 90 tablet 2   No facility-administered medications prior to visit.    PAST MEDICAL HISTORY: Past Medical History:  Diagnosis Date   Anemia    hx of   Anxiety    pt denies   Arthritis    hands and feet   Cancer ([place]) 10/04/2006   breast   Cataracts, both eyes    Depression    Depression    Diverticulosis of colon    Glaucoma    Heart murmur    No work up done 5 years ago    Joint pain    Knee pain    Obesity    OSA on CPAP    Osteoarthritis    Sleep apnea    no CPAP- no longer needed d/t weight loss   Thyroid activity decreased     PAST SURGICAL HISTORY: Past Surgical History:  Procedure Laterality Date   BREAST SURGERY Bilateral    CATARACT EXTRACTION     COLONOSCOPY     GLAUCOMA REPAIR     MASTECTOMY, RADICAL Bilateral    neulasta induced sterile abscesses     THYROIDECTOMY, PARTIAL     TONSILLECTOMY     TOTAL KNEE ARTHROPLASTY Right    TOTAL KNEE REVISION Right 05/07/2020   Procedure: RIGHT TOTAL KNEE REVISION ARTHROPLASTY;  Surgeon: Mcarthur Rossetti, MD;  Location: [facility];  Service: Orthopedics;  Laterality: Right;    FAMILY HISTORY: Family History  Problem Relation Age of Onset   CVA Mother    Cancer Mother 66       breast cancer    Heart disease Mother    Stroke Mother    Obesity Mother    Leukemia Father    High blood pressure Father    High Cholesterol Father    Heart disease Father    Cancer Maternal Aunt 64       breast cancer    Cancer Maternal Aunt 55       breast cancer   Colon cancer Maternal Aunt    Cancer Cousin 32       breast cancer   Esophageal cancer Neg Hx    Rectal cancer Neg Hx    Stomach cancer Neg Hx     SOCIAL HISTORY: Social History   Socioeconomic History   Marital status: Married    Spouse name: Zalayah Pizzuto   Number of children: 2   Years of education: Not on file   Highest education level: Not on file  Occupational History   Occupation: Retired    Comment: Pharmacist, hospital  Tobacco Use   Smoking status: Former    Types: Cigarettes    Quit date: 11/03/1978    Years since quitting: 43.4   Smokeless tobacco: Never  Vaping Use   Vaping Use: Never used  Substance and Sexual Activity   Alcohol use: No   Drug use: No   Sexual activity: Not Currently    Birth control/protection: Post-menopausal  Other Topics Concern   Not on file  Social History Narrative   Enjoys taking care of  grandchildren    Social Determinants of Health   Financial Resource Strain: Low Risk  (10/06/2021)   Overall Financial Resource Strain (CARDIA)    Difficulty of Paying Living Expenses: Not hard at all  Food  Insecurity: No Food Insecurity (10/06/2021)   Hunger Vital Sign    Worried About Running Out of Food in the Last Year: Never true    Ran Out of Food in the Last Year: Never true  Transportation Needs: No Transportation Needs (10/06/2021)   PRAPARE - Hydrologist (Medical): No    Lack of Transportation (Non-Medical): No  Physical Activity: Sufficiently Active (10/06/2021)   Exercise Vital Sign    Days of Exercise per Week: 4 days    Minutes of Exercise per Session: 60 min  Stress: No Stress Concern Present (10/06/2021)   [place]    Feeling of Stress : Not at all  Social Connections: Socially Integrated (10/06/2021)   Social Connection and Isolation Panel [NHANES]    Frequency of Communication with Friends and Family: Three times a week    Frequency of Social Gatherings with Friends and Family: Three times a week    Attends Religious Services: More than 4 times per year    Active Member of Clubs or Organizations: Yes    Attends Archivist Meetings: More than 4 times per year    Marital Status: Married  Human resources officer Violence: Not At Risk (10/06/2021)   Humiliation, Afraid, Rape, and Kick questionnaire    Fear of Current or Ex-Partner: No    Emotionally Abused: No    Physically Abused: No    Sexually Abused: No    PHYSICAL EXAM  GENERAL EXAM/CONSTITUTIONAL: Vitals:  Vitals:   03/20/22 1029 03/20/22 1041  BP: (!) 175/94 (!) 173/90  Pulse: 69 68  Weight: 205 lb 8 oz (93.2 kg)   Height: '5\' 2"'$  (1.575 m)    Body mass index is 37.59 kg/m. Wt Readings from Last 3 Encounters:  03/20/22 205 lb 8 oz (93.2 kg)  03/10/22 206 lb 6 oz (93.6 kg)  03/08/22 207 lb (93.9 kg)   Patient  is in no distress; well developed, nourished and groomed; neck is supple   EYES: Pupils round and reactive to light, Visual fields full to confrontation, Extraocular movements intacts,   MUSCULOSKELETAL: Gait, strength, tone, movements noted in Neurologic exam below  NEUROLOGIC: MENTAL STATUS:     05/09/2017   10:29 AM  MMSE - Mini Mental State Exam  Orientation to time 5  Orientation to Place 5  Registration 3  Attention/ Calculation 5  Recall 3  Language- name 2 objects 2  Language- repeat 1  Language- follow 3 step command 3  Language- read & follow direction 1  Write a sentence 1  Copy design 1  Total score 30      03/20/2022   10:36 AM  Montreal Cognitive Assessment   Visuospatial/ Executive (0/5) 4  Naming (0/3) 3  Attention: Read list of digits (0/2) 2  Attention: Read list of letters (0/1) 1  Attention: Serial 7 subtraction starting at 100 (0/3) 3  Language: Repeat phrase (0/2) 2  Language : Fluency (0/1) 1  Abstraction (0/2) 2  Delayed Recall (0/5) 4  Orientation (0/6) 6  Total 28     CRANIAL NERVE:  2nd, 3rd, 4th, 6th - pupils equal and reactive to light, visual fields full to confrontation, extraocular muscles intact, no nystagmus 5th - facial sensation symmetric 7th - facial strength symmetric 8th - hearing intact 9th - palate elevates symmetrically, uvula midline 11th - shoulder shrug symmetric 12th - tongue protrusion midline  MOTOR:  normal bulk and tone, full strength  in the BUE, BLE  SENSORY:  normal and symmetric to light touch, vibration  COORDINATION:  finger-nose-finger, fine finger movements normal  REFLEXES:  deep tendon reflexes present and symmetric  GAIT/STATION:  normal   DIAGNOSTIC DATA (LABS, IMAGING, TESTING) - I reviewed patient records, labs, notes, testing and imaging myself where available.  Lab Results  Component Value Date   WBC 3.3 (L) 02/08/2022   HGB 13.5 02/08/2022   HCT 39.9 02/08/2022   MCV 85.4  02/08/2022   PLT 139.0 (L) 02/08/2022      Component Value Date/Time   NA 138 02/08/2022 1431   NA 141 12/08/2020 1150   NA 141 07/13/2016 1015   K 4.6 02/08/2022 1431   K 4.3 07/13/2016 1015   CL 103 02/08/2022 1431   CO2 26 02/08/2022 1431   CO2 24 07/13/2016 1015   GLUCOSE 78 02/08/2022 1431   GLUCOSE 89 07/13/2016 1015   BUN 17 02/08/2022 1431   BUN 21 12/08/2020 1150   BUN 17.2 07/13/2016 1015   CREATININE 0.71 02/08/2022 1431   CREATININE 0.67 07/28/2021 0918   CREATININE 0.7 07/13/2016 1015   CALCIUM 9.7 02/08/2022 1431   CALCIUM 9.6 07/13/2016 1015   PROT 7.3 02/08/2022 1431   PROT 6.8 12/08/2020 1150   PROT 6.7 07/13/2016 1015   ALBUMIN 4.4 02/08/2022 1431   ALBUMIN 4.8 (H) 12/08/2020 1150   ALBUMIN 4.0 07/13/2016 1015   AST 31 02/08/2022 1431   AST 21 07/28/2021 0918   AST 22 07/13/2016 1015   ALT 32 02/08/2022 1431   ALT 22 07/28/2021 0918   ALT 28 07/13/2016 1015   ALKPHOS 63 02/08/2022 1431   ALKPHOS 70 07/13/2016 1015   BILITOT 0.6 02/08/2022 1431   BILITOT 0.5 07/28/2021 0918   BILITOT 0.57 07/13/2016 1015   GFRNONAA >60 07/28/2021 0918   GFRAA 101 01/15/2020 1254   Lab Results  Component Value Date   CHOL 189 12/08/2020   HDL 47 12/08/2020   LDLCALC 127 (H) 12/08/2020   TRIG 80 12/08/2020   CHOLHDL 4 05/08/2018   Lab Results  Component Value Date   HGBA1C 5.2 12/08/2020   Lab Results  Component Value Date   [id number] 737 07/27/2021   Lab Results  Component Value Date   TSH 1.17 02/08/2022     ASSESSMENT AND PLAN  71 y.o. year old female with anxiety/depression, hypothyroidism, and insomnia who is presenting with memory complaint described as word finding difficulty.  Per history, this has been going on for more than 10 years.  Other than that the rest of her cognitive examination is normal.  Patient is totally independent.  She scored 28 out of 30 on the [place] and a few years ago's scored 30 out of 30 on MMSE. I informed patient that  she has normal cognition and this is most likely age-appropriate. She does exercise at least 1 hour 3 days a week, I encouraged her to continue with exercise.  Today she was noted to have elevated blood pressure reading.  I did advise her to check her blood pressure at home for total of 2 weeks and provide those readings to her primary care doctor.  She voiced understanding.  Continue to follow-up with PCP and return as needed    1. Cognitive complaints with normal exam   2. Elevated blood pressure reading      Patient Instructions  Continue current medications Check you blood pressure at home and provide the readings to your PCP Follow  with PCP Return as needed   There are well-accepted and sensible ways to reduce risk for Alzheimers disease and other degenerative brain disorders .  Exercise Daily Walk A daily 20 minute walk should be part of your routine. Disease related apathy can be a significant roadblock to exercise and the only way to overcome this is to make it a daily routine and perhaps have a reward at the end (something your loved one loves to eat or drink perhaps) or a personal trainer coming to the home can also be very useful. Most importantly, the patient is much more likely to exercise if the caregiver / spouse does it with him/her. In general a structured, repetitive schedule is best.  General Health: Any diseases which effect your body will effect your brain such as a pneumonia, urinary infection, blood clot, heart attack or stroke. Keep contact with your primary care doctor for regular follow ups.  Sleep. A good nights sleep is healthy for the brain. Seven hours is recommended. If you have insomnia or poor sleep habits we can give you some instructions. If you have sleep apnea wear your mask.  Diet: Eating a heart healthy diet is also a good idea; fish and poultry instead of red meat, nuts (mostly non-peanuts), vegetables, fruits, olive oil or canola oil (instead of  butter), minimal salt (use other spices to flavor foods), whole grain rice, bread, cereal and pasta and wine in moderation.Research is now showing that the MIND diet, which is a combination of The Mediterranean diet and the DASH diet, is beneficial for cognitive processing and longevity. Information about this diet can be found in The MIND Diet, a book by Doyne Keel, MS, RDN, and online at NotebookDistributors.si  Finances, Power of Attorney and Advance Directives: You should consider putting legal safeguards in place with regard to financial and medical decision making. While the spouse always has power of attorney for medical and financial issues in the absence of any form, you should consider what you want in case the spouse / caregiver is no longer around or capable of making decisions.   No orders of the defined types were placed in this encounter.   No orders of the defined types were placed in this encounter.   Return if symptoms worsen or fail to improve.  I have spent a total of 60 minutes dedicated to this patient today, preparing to see patient, performing a medically appropriate examination and evaluation, ordering tests and/or medications and procedures, and counseling and educating the patient/family/caregiver; independently interpreting result and communicating results to the family/patient/caregiver; and documenting clinical information in the electronic medical record.   Alric Ran, MD 03/20/2022, 12:43 PM  [facility] [address] [place], [place] [address] [phone]

## 2022-03-21 ENCOUNTER — Encounter: Payer: Self-pay | Admitting: Physical Therapy

## 2022-03-21 ENCOUNTER — Ambulatory Visit (INDEPENDENT_AMBULATORY_CARE_PROVIDER_SITE_OTHER): Payer: Medicare Other | Admitting: Physical Therapy

## 2022-03-21 DIAGNOSIS — R600 Localized edema: Secondary | ICD-10-CM

## 2022-03-21 DIAGNOSIS — M25572 Pain in left ankle and joints of left foot: Secondary | ICD-10-CM | POA: Diagnosis not present

## 2022-03-21 DIAGNOSIS — R2689 Other abnormalities of gait and mobility: Secondary | ICD-10-CM | POA: Diagnosis not present

## 2022-03-21 DIAGNOSIS — R262 Difficulty in walking, not elsewhere classified: Secondary | ICD-10-CM

## 2022-03-21 NOTE — Therapy (Signed)
OUTPATIENT PHYSICAL THERAPY LOWER EXTREMITY EVALUATION   Patient Name: Vanessa Christensen MRN: [id number] DOB:1950-10-18, 71 y.o., female Today's Date: 03/21/2022   PT End of Session - 03/21/22 1028     Visit Number 7    Number of Visits 13    Date for PT Re-Evaluation 04/26/22    PT Start Time 1030    PT Stop Time 1115    PT Time Calculation (min) 45 min               Past Medical History:  Diagnosis Date   Anemia    hx of   Anxiety    pt denies   Arthritis    hands and feet   Cancer ([place]) 10/04/2006   breast   Cataracts, both eyes    Depression    Depression    Diverticulosis of colon    Glaucoma    Heart murmur    No work up done 5 years ago   Joint pain    Knee pain    Obesity    OSA on CPAP    Osteoarthritis    Sleep apnea    no CPAP- no longer needed d/t weight loss   Thyroid activity decreased    Past Surgical History:  Procedure Laterality Date   BREAST SURGERY Bilateral    CATARACT EXTRACTION     COLONOSCOPY     GLAUCOMA REPAIR     MASTECTOMY, RADICAL Bilateral    neulasta induced sterile abscesses     THYROIDECTOMY, PARTIAL     TONSILLECTOMY     TOTAL KNEE ARTHROPLASTY Right    TOTAL KNEE REVISION Right 05/07/2020   Procedure: RIGHT TOTAL KNEE REVISION ARTHROPLASTY;  Surgeon: Mcarthur Rossetti, MD;  Location: [facility];  Service: Orthopedics;  Laterality: Right;   Patient Active Problem List   Diagnosis Date Noted   Stress incontinence 11/08/2021   Unilateral primary osteoarthritis, left knee 12/27/2020   Status post revision of total replacement of right knee 05/07/2020   Failed total knee, right, 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/2020   History of total right knee replacement 01/28/2020   Vitamin D deficiency 08/29/2018   Insulin resistance 08/29/2018   Other specified glaucoma 08/14/2018   Incontinence of urine in female 11/07/2017   Vertigo 11/07/2017   Left knee pain 10/25/2016   Genetic testing 08/08/2016   Hypothyroidism 12/02/2015    Anxiety and depression 12/02/2015   OAB (overactive bladder) 12/02/2015   Breast cancer of upper-outer quadrant of left female breast ([place]) 12/02/2015   Obesity (BMI 30-39.9) 12/02/2015    PCP: [name], MD  REFERRING PROVIDER: Gregor Hams, MD  REFERRING DIAG: Acute left ankle pain  THERAPY DIAG:  Difficulty walking  Pain in left ankle and joints of left foot  Other abnormalities of gait and mobility  Localized edema  Rationale for Evaluation and Treatment Rehabilitation  ONSET DATE: 3 weeks.   SUBJECTIVE:   SUBJECTIVE STATEMENT:  03/21/2022:  States her foot has been pretty good but her knee is bothering her. States she has been rubbing her knee as it has been swelling. States she has been busy and not been able to do her exercises.   Pt is a 71 y/o female c/o L ankle pain. She reports that it stated about 3 weeks ago when she was exiting [employer]. Pt reports falling forward due to pain in her L ankle. Pt locates pain to the distal Achilles tendon on medial calcaneous. Pt reports no pain at rest, but  notes pain when she pushes off her foot. She is going on a mission trip to [place] on 02/27/2022 where she will be walking/ standing a lot for 4 days. She denies N/T into L foot.   PERTINENT HISTORY: Anxiety, anemia, arthritis in hands/ feet, depression, obesity, hypothyroidism.   PAIN:  Are you having pain? Yes: NPRS scale: currently 3/10 Pain location: Achillis tendon,  Pain description: dull /achy pain Aggravating factors: Walking, stairs, squatting.  Relieving factors: Rest, Voltaren   PRECAUTIONS: None  WEIGHT BEARING RESTRICTIONS No  FALLS:  Has patient fallen in last 6 months? Yes. Number of falls 1  LIVING ENVIRONMENT: Lives with: lives with their spouse Lives in: House/apartment Stairs: Yes: Internal: 12 steps; on right going up and External: 3 steps; bilateral but cannot reach both Has following equipment at home:  None  OCCUPATION: Retired.   PLOF: Independent  PATIENT GOALS Pt would like to be able to walk without pain.   OBJECTIVE:   DIAGNOSTIC FINDINGS: None    COGNITION:  Overall cognitive status: Within functional limits for tasks assessed     SENSATION: WFL  POSTURE: No Significant postural limitations  PALPATION: No tenderness to palpation.   LOWER EXTREMITY ROM:  Active ROM Right 9/27 Left 9/27  Ankle dorsiflexion 13 10  Ankle plantarflexion 50 45*  Ankle inversion 30 25  Ankle eversion 18 12   (Blank rows = not tested) *pain  LOWER EXTREMITY MMT:  MMT Right 9/27 Left 9/27  Hip flexion 5/5 5/5  Hip abduction    Knee flexion 5/5 5/5  Knee extension 5/5 5/5   (Blank rows = not tested)   FUNCTIONAL TESTS:  Timed up and go (TUG): 11.01 sec   GAIT: Distance walked: 50 ft  Assistive device utilized: None Level of assistance: Complete Independence Comments: Pt walks with antalgic gait with decreased stance time on the L LE and IV.  TODAY'S TREATMENT: 03/21/2022 Therapeutic Exercise:  Aerobic: seated: ankle inversion and eversion x3 30 with focus on reduced knee motion. heel raise x30 B, DF x30 B Prone:  Seated:    Standing:4" steps x10 2 hand support Neuromuscular Re-education: Manual Therapy: edema management to right ankle - tolerated well -reduced pain with ROM afterwards Gait training:with cane - step through pattern with cane in right hand - practiced in clinic with verbal cues and prior demonstration 10 minutes Self Care: Trigger Point Dry Needling:  Modalities:    PATIENT EDUCATION:  Education details: on HEP, on compression socks, on elevation, on self edema massage.  On thigh high compression garments instead of knee high ones Person educated: Patient Education method: Explanation and Demonstration Education comprehension: verbalized understanding and returned demonstration   HOME EXERCISE PROGRAM: Access Code: [email or web address] URL:  [email or web address] Date: 02/10/2022 Prepared by: Rudi Heap      ASSESSMENT:  CLINICAL IMPRESSION:  Session focused on edema management and education on current presentation and passive interventions to help with swelling and pain.Discussed thigh high compression garments and provided patient with measurements for these. Discussed and practiced with cane which was tolerated well. Improved gait mechanics noted with cane compared to without cane. Will follow up with these strategies next session.     OBJECTIVE IMPAIRMENTS: decreased activity tolerance, difficulty walking, decreased balance, decreased endurance, decreased mobility, decreased ROM, decreased strength, impaired flexibility, impaired LE use, postural dysfunction, and pain.  ACTIVITY LIMITATIONS: bending, lifting, carry, locomotion, cleaning, community activity, driving, and or occupation  PERSONAL FACTORS: Anxiety, anemia, arthritis in hands/ feet, depression, obesity, hypothyroidism.  are also affecting patient's functional outcome.  REHAB POTENTIAL: Good  CLINICAL DECISION MAKING: Stable/uncomplicated  EVALUATION COMPLEXITY: Low    GOALS: Short term PT Goals Target date: 04/18/2022 Pt will be I and compliant with HEP. Baseline:  Goal status: PROGRESSING Pt will decrease pain by 25% overall Baseline: Goal status: MET  Long term PT goals Target date: 05/16/2022 Pt will improve TUG by 3.4 seconds or Minimal clinically important difference.   Baseline  Goal status: MET Pt will reduce pain to overall less than 1-2/10 with usual activity and work activity. Baseline: Goal status: MET Pt will be able to ambulate community distances at least 1000 ft WNL gait pattern without complaints Baseline: Goal status: PROGRESSING - still limping   PLAN: PT FREQUENCY: 1 time per week   PT DURATION: 8 weeks  PLANNED INTERVENTIONS (unless contraindicated): aquatic PT, Canalith repositioning, cryotherapy,  Electrical stimulation, Iontophoresis with 4 mg/ml dexamethasome, Moist heat, traction, Ultrasound, gait training, Therapeutic exercise, balance training, neuromuscular re-education, patient/family education, prosthetic training, manual techniques, passive ROM, dry needling, taping, vasopnuematic device, vestibular, spinal manipulations, joint manipulations   PLAN FOR NEXT SESSION: extending POC additional 8 weeks at 1x/week,continue with ROM strength, function and ROM     11:19 AM, 03/21/22 Jerene Pitch, DPT Physical Therapy with [facility] Sinner

## 2022-03-28 ENCOUNTER — Encounter: Payer: Self-pay | Admitting: Physical Therapy

## 2022-03-28 ENCOUNTER — Ambulatory Visit (INDEPENDENT_AMBULATORY_CARE_PROVIDER_SITE_OTHER): Payer: Medicare Other | Admitting: Physical Therapy

## 2022-03-28 DIAGNOSIS — M25572 Pain in left ankle and joints of left foot: Secondary | ICD-10-CM

## 2022-03-28 DIAGNOSIS — M6281 Muscle weakness (generalized): Secondary | ICD-10-CM

## 2022-03-28 DIAGNOSIS — R2689 Other abnormalities of gait and mobility: Secondary | ICD-10-CM

## 2022-03-28 DIAGNOSIS — R600 Localized edema: Secondary | ICD-10-CM | POA: Diagnosis not present

## 2022-03-28 DIAGNOSIS — R262 Difficulty in walking, not elsewhere classified: Secondary | ICD-10-CM

## 2022-03-28 NOTE — Therapy (Signed)
OUTPATIENT PHYSICAL THERAPY LOWER EXTREMITY TREATMENT   Patient Name: Vanessa Christensen MRN: [id number] DOB:10-27-1950, 71 y.o., female Today's Date: 03/28/2022   PT End of Session - 03/28/22 1020     Visit Number 8    Number of Visits 13    Date for PT Re-Evaluation 04/26/22    PT Start Time 1020    PT Stop Time 1058    PT Time Calculation (min) 38 min               Past Medical History:  Diagnosis Date   Anemia    hx of   Anxiety    pt denies   Arthritis    hands and feet   Cancer ([place]) 10/04/2006   breast   Cataracts, both eyes    Depression    Depression    Diverticulosis of colon    Glaucoma    Heart murmur    No work up done 5 years ago   Joint pain    Knee pain    Obesity    OSA on CPAP    Osteoarthritis    Sleep apnea    no CPAP- no longer needed d/t weight loss   Thyroid activity decreased    Past Surgical History:  Procedure Laterality Date   BREAST SURGERY Bilateral    CATARACT EXTRACTION     COLONOSCOPY     GLAUCOMA REPAIR     MASTECTOMY, RADICAL Bilateral    neulasta induced sterile abscesses     THYROIDECTOMY, PARTIAL     TONSILLECTOMY     TOTAL KNEE ARTHROPLASTY Right    TOTAL KNEE REVISION Right 05/07/2020   Procedure: RIGHT TOTAL KNEE REVISION ARTHROPLASTY;  Surgeon: Mcarthur Rossetti, MD;  Location: [facility];  Service: Orthopedics;  Laterality: Right;   Patient Active Problem List   Diagnosis Date Noted   Stress incontinence 11/08/2021   Unilateral primary osteoarthritis, left knee 12/27/2020   Status post revision of total replacement of right knee 05/07/2020   Failed total knee, right, 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/2020   History of total right knee replacement 01/28/2020   Vitamin D deficiency 08/29/2018   Insulin resistance 08/29/2018   Other specified glaucoma 08/14/2018   Incontinence of urine in female 11/07/2017   Vertigo 11/07/2017   Left knee pain 10/25/2016   Genetic testing 08/08/2016   Hypothyroidism 12/02/2015    Anxiety and depression 12/02/2015   OAB (overactive bladder) 12/02/2015   Breast cancer of upper-outer quadrant of left female breast ([place]) 12/02/2015   Obesity (BMI 30-39.9) 12/02/2015    PCP: [name], MD  REFERRING PROVIDER: Gregor Hams, MD  REFERRING DIAG: Acute left ankle pain  THERAPY DIAG:  Difficulty walking  Pain in left ankle and joints of left foot  Other abnormalities of gait and mobility  Localized edema  Muscle weakness (generalized)  Rationale for Evaluation and Treatment Rehabilitation  ONSET DATE: 3 weeks.   SUBJECTIVE:   SUBJECTIVE STATEMENT:  03/28/2022:  States she had a massage yesterday mainly on her legs and she had some pain afterwards but not afterwards and went to the gym today and is feeling better but her knees bothered her. States she is wearing compression socks and using her cane which helps,   Pt is a 71 y/o female c/o L ankle pain. She reports that it stated about 3 weeks ago when she was exiting [employer]. Pt reports falling forward due to pain in her L ankle. Pt locates pain to the distal  Achilles tendon on medial calcaneous. Pt reports no pain at rest, but notes pain when she pushes off her foot. She is going on a mission trip to [place] on 02/27/2022 where she will be walking/ standing a lot for 4 days. She denies N/T into L foot.   PERTINENT HISTORY: Anxiety, anemia, arthritis in hands/ feet, depression, obesity, hypothyroidism.   PAIN:  Are you having pain? Yes: NPRS scale: currently 4/10 Pain location: left anterior knee,  Pain description: dull /achy pain Aggravating factors: Walking, stairs, squatting.  Relieving factors: Rest, Voltaren   PRECAUTIONS: None  WEIGHT BEARING RESTRICTIONS No  FALLS:  Has patient fallen in last 6 months? Yes. Number of falls 1  LIVING ENVIRONMENT: Lives with: lives with their spouse Lives in: House/apartment Stairs: Yes: Internal: 12 steps; on right going up and  External: 3 steps; bilateral but cannot reach both Has following equipment at home: None  OCCUPATION: Retired.   PLOF: Independent  PATIENT GOALS Pt would like to be able to walk without pain.   OBJECTIVE:   DIAGNOSTIC FINDINGS: None    COGNITION:  Overall cognitive status: Within functional limits for tasks assessed     SENSATION: WFL  POSTURE: No Significant postural limitations  PALPATION: No tenderness to palpation.   LOWER EXTREMITY ROM:  Active ROM Right 9/27 Left 9/27  Ankle dorsiflexion 13 10  Ankle plantarflexion 50 45*  Ankle inversion 30 25  Ankle eversion 18 12   (Blank rows = not tested) *pain  LOWER EXTREMITY MMT:  MMT Right 9/27 Left 9/27  Hip flexion 5/5 5/5  Hip abduction    Knee flexion 5/5 5/5  Knee extension 5/5 5/5   (Blank rows = not tested)   FUNCTIONAL TESTS:  Timed up and go (TUG): 11.01 sec   GAIT: Distance walked: 50 ft  Assistive device utilized: None Level of assistance: Complete Independence Comments: Pt walks with antalgic gait with decreased stance time on the L LE and IV.  TODAY'S TREATMENT: 03/28/2022 Therapeutic Exercise:  Aerobic: seated: goddess pose with hip ER activation 5" x3 of 1 minutes bouts, hamstring stretch x3 30" holds B, piriformis stretch with towel x3 45" holds, ankle pumps x30  Prone:   Standing: Neuromuscular Re-education: Manual Therapy:STM to left knee - tolerated well  Gait training:w  Self Care: Trigger Point Dry Needling:  Modalities:    PATIENT EDUCATION:  Education details: on HEP, on anatomy and rationale for hip intervention, on different types of shoes. Person educated: Patient Education method: Customer service manager Education comprehension: verbalized understanding and returned demonstration   HOME EXERCISE PROGRAM: Access Code: [email or web address] URL: [email or web address] Date: 02/10/2022 Prepared by: Rudi Heap      ASSESSMENT:  CLINICAL  IMPRESSION:  Session focused on hip mobility which was tolerated well. No pain in knee noted after new exercises. Added to HEP. Discussed with patient about wearing shoes with backing so she doesn't drag her feet as much. Will continue with POC as tolerated.      OBJECTIVE IMPAIRMENTS: decreased activity tolerance, difficulty walking, decreased balance, decreased endurance, decreased mobility, decreased ROM, decreased strength, impaired flexibility, impaired LE use, postural dysfunction, and pain.  ACTIVITY LIMITATIONS: bending, lifting, carry, locomotion, cleaning, community activity, driving, and or occupation  PERSONAL FACTORS: Anxiety, anemia, arthritis in hands/ feet, depression, obesity, hypothyroidism.  are also affecting patient's functional outcome.  REHAB POTENTIAL: Good  CLINICAL DECISION MAKING: Stable/uncomplicated  EVALUATION COMPLEXITY: Low    GOALS: Short term PT Goals Target date: 04/25/2022 Pt will be I  and compliant with HEP. Baseline:  Goal status: PROGRESSING Pt will decrease pain by 25% overall Baseline: Goal status: MET  Long term PT goals Target date: 05/23/2022 Pt will improve TUG by 3.4 seconds or Minimal clinically important difference.   Baseline  Goal status: MET Pt will reduce pain to overall less than 1-2/10 with usual activity and work activity. Baseline: Goal status: MET Pt will be able to ambulate community distances at least 1000 ft WNL gait pattern without complaints Baseline: Goal status: PROGRESSING - still limping   PLAN: PT FREQUENCY: 1 time per week   PT DURATION: 8 weeks  PLANNED INTERVENTIONS (unless contraindicated): aquatic PT, Canalith repositioning, cryotherapy, Electrical stimulation, Iontophoresis with 4 mg/ml dexamethasome, Moist heat, traction, Ultrasound, gait training, Therapeutic exercise, balance training, neuromuscular re-education, patient/family education, prosthetic training, manual techniques, passive ROM, dry  needling, taping, vasopnuematic device, vestibular, spinal manipulations, joint manipulations   PLAN FOR NEXT SESSION: extending POC additional 8 weeks at 1x/week,continue with ROM strength, function and ROM     10:22 AM, 03/28/22 Jerene Pitch, DPT Physical Therapy with [facility] Sinner

## 2022-04-04 ENCOUNTER — Encounter: Payer: Self-pay | Admitting: Physical Therapy

## 2022-04-04 ENCOUNTER — Ambulatory Visit (INDEPENDENT_AMBULATORY_CARE_PROVIDER_SITE_OTHER): Payer: Medicare Other | Admitting: Physical Therapy

## 2022-04-04 DIAGNOSIS — R2689 Other abnormalities of gait and mobility: Secondary | ICD-10-CM | POA: Diagnosis not present

## 2022-04-04 DIAGNOSIS — M6281 Muscle weakness (generalized): Secondary | ICD-10-CM

## 2022-04-04 DIAGNOSIS — M25572 Pain in left ankle and joints of left foot: Secondary | ICD-10-CM | POA: Diagnosis not present

## 2022-04-04 DIAGNOSIS — R262 Difficulty in walking, not elsewhere classified: Secondary | ICD-10-CM | POA: Diagnosis not present

## 2022-04-04 DIAGNOSIS — R600 Localized edema: Secondary | ICD-10-CM | POA: Diagnosis not present

## 2022-04-04 NOTE — Therapy (Signed)
OUTPATIENT PHYSICAL THERAPY LOWER EXTREMITY TREATMENT   Patient Name: Vanessa Christensen MRN: [id number] DOB:1950/07/27, 71 y.o., female Today's Date: 04/04/2022   PT End of Session - 04/04/22 1018     Visit Number 9    Number of Visits 13    Date for PT Re-Evaluation 04/26/22    PT Start Time 6389    PT Stop Time 1102    PT Time Calculation (min) 43 min               Past Medical History:  Diagnosis Date   Anemia    hx of   Anxiety    pt denies   Arthritis    hands and feet   Cancer ([place]) 10/04/2006   breast   Cataracts, both eyes    Depression    Depression    Diverticulosis of colon    Glaucoma    Heart murmur    No work up done 5 years ago   Joint pain    Knee pain    Obesity    OSA on CPAP    Osteoarthritis    Sleep apnea    no CPAP- no longer needed d/t weight loss   Thyroid activity decreased    Past Surgical History:  Procedure Laterality Date   BREAST SURGERY Bilateral    CATARACT EXTRACTION     COLONOSCOPY     GLAUCOMA REPAIR     MASTECTOMY, RADICAL Bilateral    neulasta induced sterile abscesses     THYROIDECTOMY, PARTIAL     TONSILLECTOMY     TOTAL KNEE ARTHROPLASTY Right    TOTAL KNEE REVISION Right 05/07/2020   Procedure: RIGHT TOTAL KNEE REVISION ARTHROPLASTY;  Surgeon: Mcarthur Rossetti, MD;  Location: [facility];  Service: Orthopedics;  Laterality: Right;   Patient Active Problem List   Diagnosis Date Noted   Stress incontinence 11/08/2021   Unilateral primary osteoarthritis, left knee 12/27/2020   Status post revision of total replacement of right knee 05/07/2020   Failed total knee, right, 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/2020   History of total right knee replacement 01/28/2020   Vitamin D deficiency 08/29/2018   Insulin resistance 08/29/2018   Other specified glaucoma 08/14/2018   Incontinence of urine in female 11/07/2017   Vertigo 11/07/2017   Left knee pain 10/25/2016   Genetic testing 08/08/2016   Hypothyroidism 12/02/2015    Anxiety and depression 12/02/2015   OAB (overactive bladder) 12/02/2015   Breast cancer of upper-outer quadrant of left female breast ([place]) 12/02/2015   Obesity (BMI 30-39.9) 12/02/2015    PCP: [name], MD  REFERRING PROVIDER: Gregor Hams, MD  REFERRING DIAG: Acute left ankle pain  THERAPY DIAG:  Difficulty walking  Pain in left ankle and joints of left foot  Other abnormalities of gait and mobility  Localized edema  Muscle weakness (generalized)  Rationale for Evaluation and Treatment Rehabilitation  ONSET DATE: 3 weeks.   SUBJECTIVE:   SUBJECTIVE STATEMENT:  04/04/2022:  States she is feeling really good. States she keeps forgetting there are things that shouldn't do. 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 States her knee and foot was painful afterwards.   Pt is a 71 y/o female c/o L ankle pain. She reports that it stated about 3 weeks ago when she was exiting [employer]. Pt reports falling forward due to pain in her L ankle. Pt locates pain to the distal Achilles tendon on medial calcaneous. Pt reports no pain at rest,  but notes pain when she pushes off her foot. She is going on a mission trip to [place] on 02/27/2022 where she will be walking/ standing a lot for 4 days. She denies N/T into L foot.   PERTINENT HISTORY: Anxiety, anemia, arthritis in hands/ feet, depression, obesity, hypothyroidism.   PAIN:  Are you having pain? Yes: NPRS scale: currently 0/10 Pain location: left anterior knee,  Pain description: dull /achy pain Aggravating factors: Walking, stairs, squatting.  Relieving factors: Rest, Voltaren   PRECAUTIONS: None  WEIGHT BEARING RESTRICTIONS No  FALLS:  Has patient fallen in last 6 months? Yes. Number of falls 1  LIVING ENVIRONMENT: Lives with: lives with their spouse Lives in: House/apartment Stairs: Yes: Internal: 12 steps; on right going up and External: 3 steps; bilateral but  cannot reach both Has following equipment at home: None  OCCUPATION: Retired.   PLOF: Independent  PATIENT GOALS Pt would like to be able to walk without pain.   OBJECTIVE:   DIAGNOSTIC FINDINGS: None    COGNITION:  Overall cognitive status: Within functional limits for tasks assessed     SENSATION: WFL  POSTURE: No Significant postural limitations  PALPATION: No tenderness to palpation.   LOWER EXTREMITY ROM:  Active ROM Right 9/27 Left 9/27  Ankle dorsiflexion 13 10  Ankle plantarflexion 50 45*  Ankle inversion 30 25  Ankle eversion 18 12   (Blank rows = not tested) *pain  LOWER EXTREMITY MMT:  MMT Right 9/27 Left 9/27  Hip flexion 5/5 5/5  Hip abduction    Knee flexion 5/5 5/5  Knee extension 5/5 5/5   (Blank rows = not tested)   FUNCTIONAL TESTS:  Timed up and go (TUG): 11.01 sec   GAIT: Distance walked: 50 ft  Assistive device utilized: None Level of assistance: Complete Independence Comments: Pt walks with antalgic gait with decreased stance time on the L LE and IV.  TODAY'S TREATMENT: 04/04/2022 Therapeutic Exercise:  Aerobic: seated: back up against the wall modified piriformis stretch into IR/ER x5 15" holds B, windshield wipers with back up against the wall x20 B, shoulder IR/ER stretch x10 10" holds B Prone: cat/cow - 8 minutes with tactile cues, hip ROM in quadruped 5 minutes   Standing: Neuromuscular Re-education: Manual Therapy:  Gait training:w  Self Care: Trigger Point Dry Needling:  Modalities:    PATIENT EDUCATION:  Education details: on HEP, on anatomy and rationale for interventions Person educated: Patient Education method: Explanation and Demonstration Education comprehension: verbalized understanding and returned demonstration   HOME EXERCISE PROGRAM: Access Code: [email or web address] URL: [email or web address]      ASSESSMENT:  CLINICAL IMPRESSION:  Continued to focus on mobility and education.  Practiced exercises performed in gym class and cued patient for form to reduce stress on knee and ankle. Educated patient on importance of hip mobility to reduce stress on knee and ankle. Tolerated session well with reports of feeling good and limbered end of session. Will continue with current POC as tolerated.      OBJECTIVE IMPAIRMENTS: decreased activity tolerance, difficulty walking, decreased balance, decreased endurance, decreased mobility, decreased ROM, decreased strength, impaired flexibility, impaired LE use, postural dysfunction, and pain.  ACTIVITY LIMITATIONS: bending, lifting, carry, locomotion, cleaning, community activity, driving, and or occupation  PERSONAL FACTORS: Anxiety, anemia, arthritis in hands/ feet, depression, obesity, hypothyroidism.  are also affecting patient's functional outcome.  REHAB POTENTIAL: Good  CLINICAL DECISION MAKING: Stable/uncomplicated  EVALUATION COMPLEXITY: Low    GOALS: Short term PT Goals Target date: 05/02/2022  Pt will be I and compliant with HEP. Baseline:  Goal status: PROGRESSING Pt will decrease pain by 25% overall Baseline: Goal status: MET  Long term PT goals Target date: 05/30/2022 Pt will improve TUG by 3.4 seconds or Minimal clinically important difference.   Baseline  Goal status: MET Pt will reduce pain to overall less than 1-2/10 with usual activity and work activity. Baseline: Goal status: MET Pt will be able to ambulate community distances at least 1000 ft WNL gait pattern without complaints Baseline: Goal status: PROGRESSING - still limping   PLAN: PT FREQUENCY: 1 time per week   PT DURATION: 8 weeks  PLANNED INTERVENTIONS (unless contraindicated): aquatic PT, Canalith repositioning, cryotherapy, Electrical stimulation, Iontophoresis with 4 mg/ml dexamethasome, Moist heat, traction, Ultrasound, gait training, Therapeutic exercise, balance training, neuromuscular re-education, patient/family education,  prosthetic training, manual techniques, passive ROM, dry needling, taping, vasopnuematic device, vestibular, spinal manipulations, joint manipulations   PLAN FOR NEXT SESSION: anticipate DC - review HEP and hip exercises     11:10 AM, 04/04/22 Jerene Pitch, DPT Physical Therapy with [facility] Sinner

## 2022-04-11 ENCOUNTER — Ambulatory Visit (INDEPENDENT_AMBULATORY_CARE_PROVIDER_SITE_OTHER): Payer: Medicare Other | Admitting: Physical Therapy

## 2022-04-11 ENCOUNTER — Encounter: Payer: Self-pay | Admitting: Physical Therapy

## 2022-04-11 DIAGNOSIS — R2689 Other abnormalities of gait and mobility: Secondary | ICD-10-CM

## 2022-04-11 DIAGNOSIS — M25572 Pain in left ankle and joints of left foot: Secondary | ICD-10-CM | POA: Diagnosis not present

## 2022-04-11 DIAGNOSIS — R262 Difficulty in walking, not elsewhere classified: Secondary | ICD-10-CM | POA: Diagnosis not present

## 2022-04-11 NOTE — Therapy (Signed)
OUTPATIENT PHYSICAL THERAPY LOWER EXTREMITY TREATMENT  PHYSICAL THERAPY DISCHARGE SUMMARY  Visits from Start of Care: 9  Current functional level related to goals / functional outcomes: All met   Remaining deficits: Continued weakness   Education / Equipment: See below   Patient agrees to discharge. Patient goals were partially met. Patient is being discharged due to being pleased with the current functional level.  Patient Name: Vanessa Christensen MRN: [id number] DOB:05-03-1951, 71 y.o., female Today's Date: 04/11/2022   PT End of Session - 04/11/22 1104     Visit Number 10    Number of Visits 13    Date for PT Re-Evaluation 04/26/22    PT Start Time 1106    PT Stop Time 1144    PT Time Calculation (min) 38 min               Past Medical History:  Diagnosis Date   Anemia    hx of   Anxiety    pt denies   Arthritis    hands and feet   Cancer ([place]) 10/04/2006   breast   Cataracts, both eyes    Depression    Depression    Diverticulosis of colon    Glaucoma    Heart murmur    No work up done 5 years ago   Joint pain    Knee pain    Obesity    OSA on CPAP    Osteoarthritis    Sleep apnea    no CPAP- no longer needed d/t weight loss   Thyroid activity decreased    Past Surgical History:  Procedure Laterality Date   BREAST SURGERY Bilateral    CATARACT EXTRACTION     COLONOSCOPY     GLAUCOMA REPAIR     MASTECTOMY, RADICAL Bilateral    neulasta induced sterile abscesses     THYROIDECTOMY, PARTIAL     TONSILLECTOMY     TOTAL KNEE ARTHROPLASTY Right    TOTAL KNEE REVISION Right 05/07/2020   Procedure: RIGHT TOTAL KNEE REVISION ARTHROPLASTY;  Surgeon: Mcarthur Rossetti, MD;  Location: [facility];  Service: Orthopedics;  Laterality: Right;   Patient Active Problem List   Diagnosis Date Noted   Stress incontinence 11/08/2021   Unilateral primary osteoarthritis, left knee 12/27/2020   Status post revision of total replacement of right knee 05/07/2020    Failed total knee, right, 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/2020   History of total right knee replacement 01/28/2020   Vitamin D deficiency 08/29/2018   Insulin resistance 08/29/2018   Other specified glaucoma 08/14/2018   Incontinence of urine in female 11/07/2017   Vertigo 11/07/2017   Left knee pain 10/25/2016   Genetic testing 08/08/2016   Hypothyroidism 12/02/2015   Anxiety and depression 12/02/2015   OAB (overactive bladder) 12/02/2015   Breast cancer of upper-outer quadrant of left female breast ([place]) 12/02/2015   Obesity (BMI 30-39.9) 12/02/2015    PCP: [name], MD  REFERRING PROVIDER: Gregor Hams, MD  REFERRING DIAG: Acute left ankle pain  THERAPY DIAG:  Difficulty walking  Pain in left ankle and joints of left foot  Other abnormalities of gait and mobility  Rationale for Evaluation and Treatment Rehabilitation  ONSET DATE: 3 weeks.   SUBJECTIVE:   SUBJECTIVE STATEMENT:  04/11/2022:  States she is doing well. States she went to the gym and is feeling better. Comes in donning her ankle sleeve.  Pt is a 71 y/o female c/o L ankle pain. She reports that it stated  about 3 weeks ago when she was exiting [employer]. Pt reports falling forward due to pain in her L ankle. Pt locates pain to the distal Achilles tendon on medial calcaneous. Pt reports no pain at rest, but notes pain when she pushes off her foot. She is going on a mission trip to [place] on 02/27/2022 where she will be walking/ standing a lot for 4 days. She denies N/T into L foot.   PERTINENT HISTORY: Anxiety, anemia, arthritis in hands/ feet, depression, obesity, hypothyroidism.   PAIN:  Are you having pain? Yes: NPRS scale: currently 0/10 Pain location: left anterior knee,  Pain description: dull /achy pain Aggravating factors: Walking, stairs, squatting.  Relieving factors: Rest, Voltaren   PRECAUTIONS: None  WEIGHT BEARING RESTRICTIONS No  FALLS:  Has patient fallen in last 6  months? Yes. Number of falls 1  LIVING ENVIRONMENT: Lives with: lives with their spouse Lives in: House/apartment Stairs: Yes: Internal: 12 steps; on right going up and External: 3 steps; bilateral but cannot reach both Has following equipment at home: None  OCCUPATION: Retired.   PLOF: Independent  PATIENT GOALS Pt would like to be able to walk without pain.   OBJECTIVE:   DIAGNOSTIC FINDINGS: None    COGNITION:  Overall cognitive status: Within functional limits for tasks assessed     SENSATION: WFL  POSTURE: No Significant postural limitations  PALPATION: No tenderness to palpation.   LOWER EXTREMITY ROM:  Active ROM Right 9/27 Left 9/27  Ankle dorsiflexion 13 10  Ankle plantarflexion 50 45*  Ankle inversion 30 25  Ankle eversion 18 12   (Blank rows = not tested) *pain  LOWER EXTREMITY MMT:  MMT Right 9/27 Left 9/27  Hip flexion 5/5 5/5  Hip abduction    Knee flexion 5/5 5/5  Knee extension 5/5 5/5   (Blank rows = not tested)   FUNCTIONAL TESTS:  Timed up and go (TUG): 11.01 sec   GAIT: Distance walked: 50 ft  Assistive device utilized: None Level of assistance: Complete Independence Comments: Pt walks with antalgic gait with decreased stance time on the L LE and IV.  TODAY'S TREATMENT: 04/11/2022 Therapeutic Exercise:  Aerobic: seated: back up against the wall modified piriformis stretch into IR/ER x5 15" holds B, hamstring stretch x2 30" holds B , review of all exercises on HEP - answered all questions Prone:   Standing: Neuromuscular Re-education: Manual Therapy:  Gait training:w  Self Care: Trigger Point Dry Needling:  Modalities:    PATIENT EDUCATION:  Education details: on HEP, review of exercises, proper donning of compression sleeve.  Person educated: Patient Education method: Customer service manager Education comprehension: verbalized understanding and returned demonstration   HOME EXERCISE PROGRAM: Access Code:  [email or web address] URL: [email or web address]      ASSESSMENT:  CLINICAL IMPRESSION:  =   Patient doing very well and met all goals at this time. Answered questions and focus was review of HEP. Patient to discharge from PT to HEP secondary to progress made and independence in current HEP. Answered all questions prior to DC  OBJECTIVE IMPAIRMENTS: decreased activity tolerance, difficulty walking, decreased balance, decreased endurance, decreased mobility, decreased ROM, decreased strength, impaired flexibility, impaired LE use, postural dysfunction, and pain.  ACTIVITY LIMITATIONS: bending, lifting, carry, locomotion, cleaning, community activity, driving, and or occupation  PERSONAL FACTORS: Anxiety, anemia, arthritis in hands/ feet, depression, obesity, hypothyroidism.  are also affecting patient's functional outcome.  REHAB POTENTIAL: Good  CLINICAL DECISION MAKING: Stable/uncomplicated  EVALUATION COMPLEXITY: Low  GOALS: Short term PT Goals Target date: 05/09/2022 Pt will be I and compliant with HEP. Baseline:  Goal status: MET Pt will decrease pain by 25% overall Baseline: Goal status: MET  Long term PT goals Target date: 06/06/2022 Pt will improve TUG by 3.4 seconds or Minimal clinically important difference.   Baseline  Goal status: MET Pt will reduce pain to overall less than 1-2/10 with usual activity and work activity. Baseline: Goal status: MET Pt will be able to ambulate community distances at least 1000 ft WNL gait pattern without complaints Baseline: Goal statusMET  PLAN: PT FREQUENCY: 1 time per week   PT DURATION: 8 weeks  PLANNED INTERVENTIONS (unless contraindicated): aquatic PT, Canalith repositioning, cryotherapy, Electrical stimulation, Iontophoresis with 4 mg/ml dexamethasome, Moist heat, traction, Ultrasound, gait training, Therapeutic exercise, balance training, neuromuscular re-education, patient/family education, prosthetic training,  manual techniques, passive ROM, dry needling, taping, vasopnuematic device, vestibular, spinal manipulations, joint manipulations   PLAN FOR NEXT SESSION: DC to HEP     12:54 PM, 04/11/22 Jerene Pitch, DPT Physical Therapy with [facility]

## 2022-04-12 ENCOUNTER — Ambulatory Visit (INDEPENDENT_AMBULATORY_CARE_PROVIDER_SITE_OTHER): Payer: Medicare Other | Admitting: Bariatrics

## 2022-04-12 ENCOUNTER — Ambulatory Visit: Payer: Medicare Other | Admitting: Bariatrics

## 2022-04-12 ENCOUNTER — Encounter: Payer: Self-pay | Admitting: Bariatrics

## 2022-04-12 VITALS — BP 129/80 | HR 78 | Temp 98.4°F | Ht 62.0 in | Wt 205.0 lb

## 2022-04-12 DIAGNOSIS — E669 Obesity, unspecified: Secondary | ICD-10-CM

## 2022-04-12 DIAGNOSIS — I10 Essential (primary) hypertension: Secondary | ICD-10-CM

## 2022-04-12 DIAGNOSIS — N393 Stress incontinence (female) (male): Secondary | ICD-10-CM | POA: Diagnosis not present

## 2022-04-12 DIAGNOSIS — Z6837 Body mass index (BMI) 37.0-37.9, adult: Secondary | ICD-10-CM | POA: Diagnosis not present

## 2022-04-12 MED ORDER — SOLIFENACIN SUCCINATE 10 MG PO TABS: 10.0000 mg | ORAL_TABLET | Freq: Every day | ORAL | 0 refills | Status: DC

## 2022-04-12 MED ORDER — SOLIFENACIN SUCCINATE 5 MG PO TABS: 5.0000 mg | ORAL_TABLET | Freq: Every day | ORAL | 0 refills | Status: DC

## 2022-04-13 DIAGNOSIS — E669 Obesity, unspecified: Secondary | ICD-10-CM | POA: Insufficient documentation

## 2022-04-13 DIAGNOSIS — I10 Essential (primary) hypertension: Secondary | ICD-10-CM | POA: Insufficient documentation

## 2022-04-19 DIAGNOSIS — Z23 Encounter for immunization: Secondary | ICD-10-CM | POA: Diagnosis not present

## 2022-04-24 NOTE — Progress Notes (Signed)
Chief Complaint:   OBESITY Vanessa Christensen is here to discuss her progress with her obesity treatment plan along with follow-up of her obesity related diagnoses. Vanessa Christensen is on the Category 3 Plan and states she is following her eating plan approximately 30% of the time. Vanessa Christensen states she is at the gym for 60 minutes 2-3 times per week.  Today's visit was #: 34 Starting weight: 241 lbs Starting date: 08/13/2018 Today's weight: 205 lbs Today's date: 04/12/2022 Total lbs lost to date: 36 Total lbs lost since last in-office visit: 0  Interim History: Vanessa Christensen is up 8 lbs since her last visit. She did some snacking at [employer].   Subjective:   1. Stress incontinence Vanessa Christensen notes Vanessa Christensen is helping with her urination.   2. Essential hypertension Vanessa Christensen is not on medications, and her blood pressure is well controlled.   Assessment/Plan:   1. Stress incontinence Vanessa Christensen agreed to increase Vesicare from 5 mg to 10 mg once daily, and we will refill for 90 days.  - solifenacin (VESICARE) 10 MG tablet; Take 1 tablet (10 mg total) by mouth daily.  Dispense: 90 tablet; Refill: 0  2. Essential hypertension Vanessa Christensen will work on her diet and exercise, and will eliminate added salt.   3. Obesity, current BMI 37.6 Vanessa Christensen is currently in the action stage of change. As such, her goal is to continue with weight loss efforts. She has agreed to the Category 3 Plan.   She will adhere closely to the plan 80-90%. Thanksgiving strategies were given.   Exercise goals: As is.   Behavioral modification strategies: increasing lean protein intake, decreasing simple carbohydrates, increasing vegetables, increasing water intake, decreasing eating out, no skipping meals, meal planning and cooking strategies, keeping healthy foods in the home, and planning for success.  Vanessa Christensen has agreed to follow-up with our clinic in 4 weeks. She was informed of the importance of frequent follow-up visits to maximize her success  with intensive lifestyle modifications for her multiple health conditions.   Objective:   Blood pressure 129/80, pulse 78, temperature 98.4 F (36.9 C), height '5\' 2"'$  (1.575 m), weight 205 lb (93 kg), SpO2 99 %. Body mass index is 37.49 kg/m.  General: Cooperative, alert, well developed, in no acute distress. HEENT: Conjunctivae and lids unremarkable. Cardiovascular: Regular rhythm.  Lungs: Normal work of breathing. Neurologic: No focal deficits.   Lab Results  Component Value Date   CREATININE 0.71 02/08/2022   BUN 17 02/08/2022   NA 138 02/08/2022   K 4.6 02/08/2022   CL 103 02/08/2022   CO2 26 02/08/2022   Lab Results  Component Value Date   ALT 32 02/08/2022   AST 31 02/08/2022   ALKPHOS 63 02/08/2022   BILITOT 0.6 02/08/2022   Lab Results  Component Value Date   HGBA1C 5.2 12/08/2020   HGBA1C 5.6 01/15/2020   HGBA1C 5.4 11/04/2018   HGBA1C 5.6 08/13/2018   Lab Results  Component Value Date   INSULIN 4.7 12/08/2020   INSULIN 8.0 01/15/2020   INSULIN 10.9 11/04/2018   INSULIN 13.2 08/13/2018   Lab Results  Component Value Date   TSH 1.17 02/08/2022   Lab Results  Component Value Date   CHOL 189 12/08/2020   HDL 47 12/08/2020   LDLCALC 127 (H) 12/08/2020   TRIG 80 12/08/2020   CHOLHDL 4 05/08/2018   Lab Results  Component Value Date   VD25OH 32.84 07/27/2021   VD25OH 74.7 12/08/2020   VD25OH 36.9 01/15/2020   Lab  Results  Component Value Date   WBC 3.3 (L) 02/08/2022   HGB 13.5 02/08/2022   HCT 39.9 02/08/2022   MCV 85.4 02/08/2022   PLT 139.0 (L) 02/08/2022   Lab Results  Component Value Date   IRON 51 10/25/2021   TIBC 423 10/25/2021   FERRITIN 34 10/25/2021   Attestation Statements:   Reviewed by clinician on day of visit: allergies, medications, problem list, medical history, surgical history, family history, social history, and previous encounter notes.   Wilhemena Durie, am acting as Location manager for [employer], DO.  I  have reviewed the above documentation for accuracy and completeness, and agree with the above. Jearld Lesch, DO'

## 2022-05-11 ENCOUNTER — Ambulatory Visit (INDEPENDENT_AMBULATORY_CARE_PROVIDER_SITE_OTHER): Payer: Medicare Other | Admitting: Bariatrics

## 2022-05-11 ENCOUNTER — Encounter: Payer: Self-pay | Admitting: Bariatrics

## 2022-05-11 VITALS — BP 155/83 | HR 79 | Temp 98.0°F | Ht 62.0 in | Wt 205.0 lb

## 2022-05-11 DIAGNOSIS — E88819 Insulin resistance, unspecified: Secondary | ICD-10-CM | POA: Diagnosis not present

## 2022-05-11 DIAGNOSIS — E669 Obesity, unspecified: Secondary | ICD-10-CM

## 2022-05-11 DIAGNOSIS — E038 Other specified hypothyroidism: Secondary | ICD-10-CM | POA: Diagnosis not present

## 2022-05-11 DIAGNOSIS — Z6837 Body mass index (BMI) 37.0-37.9, adult: Secondary | ICD-10-CM

## 2022-05-16 ENCOUNTER — Encounter: Payer: Self-pay | Admitting: Bariatrics

## 2022-05-24 ENCOUNTER — Encounter: Payer: Self-pay | Admitting: Bariatrics

## 2022-05-24 NOTE — Progress Notes (Signed)
Chief Complaint:   OBESITY Vanessa Christensen is here to discuss her progress with her obesity treatment plan along with follow-up of her obesity related diagnoses. Vanessa Christensen is on the Category 3 Plan and states she is following her eating plan approximately 50-60% of the time. Vanessa Christensen states she is going to the gym for 60 minutes 4 times per week.  Today's visit was #: 68 Starting weight: 241 lbs Starting date: 08/13/2018 Today's weight: 205 lbs Today's date: 05/11/2022 Total lbs lost to date: 36 Total lbs lost since last in-office visit: 0  Interim History: Vanessa Christensen's weight is the same as her previous visit. She states that her weight goes up and down.   Subjective:   1. Insulin resistance Vanessa Christensen is not on medications currently.   2. Other specified hypothyroidism Vanessa Christensen is taking Synthroid.   Assessment/Plan:   1. Insulin resistance Vanessa Christensen will keep all carbohydrates low (sugar and starches).   2. Other specified hypothyroidism Vanessa Christensen will continue her medications as directed.   3. Obesity, current BMI 37.6 Vanessa Christensen is currently in the action stage of change. As such, her goal is to continue with weight loss efforts. She has agreed to the Category 3 Plan.   Meal planning and intentional eating were discussed. Increase protein, protein shake handout was given. She will work on portion size.  Exercise goals: As is.   Behavioral modification strategies: increasing lean protein intake, decreasing simple carbohydrates, increasing vegetables, increasing water intake, decreasing eating out, no skipping meals, meal planning and cooking strategies, and keeping healthy foods in the home.  Vanessa Christensen has agreed to follow-up with our clinic in 4 weeks. She was informed of the importance of frequent follow-up visits to maximize her success with intensive lifestyle modifications for her multiple health conditions.   Objective:   Blood pressure (!) 155/83, pulse 79, temperature 98 F (36.7 C), height  '5\' 2"'$  (1.575 m), weight 205 lb (93 kg), SpO2 98 %. Body mass index is 37.49 kg/m.  General: Cooperative, alert, well developed, in no acute distress. HEENT: Conjunctivae and lids unremarkable. Cardiovascular: Regular rhythm.  Lungs: Normal work of breathing. Neurologic: No focal deficits.   Lab Results  Component Value Date   CREATININE 0.71 02/08/2022   BUN 17 02/08/2022   NA 138 02/08/2022   K 4.6 02/08/2022   CL 103 02/08/2022   CO2 26 02/08/2022   Lab Results  Component Value Date   ALT 32 02/08/2022   AST 31 02/08/2022   ALKPHOS 63 02/08/2022   BILITOT 0.6 02/08/2022   Lab Results  Component Value Date   HGBA1C 5.2 12/08/2020   HGBA1C 5.6 01/15/2020   HGBA1C 5.4 11/04/2018   HGBA1C 5.6 08/13/2018   Lab Results  Component Value Date   INSULIN 4.7 12/08/2020   INSULIN 8.0 01/15/2020   INSULIN 10.9 11/04/2018   INSULIN 13.2 08/13/2018   Lab Results  Component Value Date   TSH 1.17 02/08/2022   Lab Results  Component Value Date   CHOL 189 12/08/2020   HDL 47 12/08/2020   LDLCALC 127 (H) 12/08/2020   TRIG 80 12/08/2020   CHOLHDL 4 05/08/2018   Lab Results  Component Value Date   VD25OH 32.84 07/27/2021   VD25OH 74.7 12/08/2020   VD25OH 36.9 01/15/2020   Lab Results  Component Value Date   WBC 3.3 (L) 02/08/2022   HGB 13.5 02/08/2022   HCT 39.9 02/08/2022   MCV 85.4 02/08/2022   PLT 139.0 (L) 02/08/2022   Lab Results  Component Value Date   IRON 51 10/25/2021   TIBC 423 10/25/2021   FERRITIN 34 10/25/2021   Attestation Statements:   Reviewed by clinician on day of visit: allergies, medications, problem list, medical history, surgical history, family history, social history, and previous encounter notes.  Wilhemena Durie, am acting as Location manager for [employer], DO.  I have reviewed the above documentation for accuracy and completeness, and I agree with the above. Jearld Lesch, DO

## 2022-06-08 ENCOUNTER — Ambulatory Visit: Payer: Medicare Other | Admitting: Bariatrics

## 2022-06-12 ENCOUNTER — Ambulatory Visit (INDEPENDENT_AMBULATORY_CARE_PROVIDER_SITE_OTHER): Payer: Medicare Other | Admitting: Family Medicine

## 2022-06-12 ENCOUNTER — Encounter: Payer: Self-pay | Admitting: Family Medicine

## 2022-06-12 VITALS — BP 132/80 | HR 76 | Temp 98.9°F | Resp 17 | Ht 62.0 in | Wt 207.5 lb

## 2022-06-12 DIAGNOSIS — R413 Other amnesia: Secondary | ICD-10-CM | POA: Diagnosis not present

## 2022-06-12 DIAGNOSIS — F419 Anxiety disorder, unspecified: Secondary | ICD-10-CM | POA: Diagnosis not present

## 2022-06-12 DIAGNOSIS — F32A Depression, unspecified: Secondary | ICD-10-CM | POA: Diagnosis not present

## 2022-06-12 MED ORDER — BUPROPION HCL ER (XL) 300 MG PO TB24: 300.0000 mg | ORAL_TABLET | Freq: Every day | ORAL | 3 refills | Status: DC

## 2022-06-12 NOTE — Assessment & Plan Note (Signed)
Deteriorated.  Pt was evaluated by Neuro in October and felt she had normal, age appropriate cognition.  Pt disagrees.  She feels that her memory is worse and she has had issues w/ word finding, naming, and directions.  She feels that her memory issues are dramatically impacting her depression and she is becoming a burden to her husband.  I suspect it's actually the other way around and her depression is dramatically impacting her memory.  Discussed dx of pseudo-dementia w/ pt.  Will work on treating depression to see if memory improves- particularly w/ recent normal neuro eval.  Will also stop OAB medication as this can cause issues w/ memory.  Pt expressed understanding and is in agreement w/ plan.

## 2022-06-12 NOTE — Patient Instructions (Signed)
Follow up in 3-4 weeks to recheck mood STOP the Vesicare (bladder medication) INCREASE the Wellbutrin (Bupropion) to '300mg'$  daily- 2 of what you have at home and 1 of the new prescription CONTINUE the Fluoxetine daily Do word and number puzzles/games to keep your brain sharp Call with any questions or concerns Stay Safe!  Stay Healthy! Hang in there!!

## 2022-06-12 NOTE — Progress Notes (Signed)
   Subjective:    Patient ID: Vanessa Christensen, female    DOB: 09-05-1950, 72 y.o.   MRN: [id number]  HPI Memory loss- pt was evaluated by Neuro in October and was told she was having normal, age related cognition.  Pt feels that memory is worsening.  Husband wants her to use GPS whenever she's driving but she hasn't felt the need to use it with familiar locations.  Pt's memory issues are greatly impact her depression.  She is wishing to die in her sleep so that her husband can find someone that isn't a burden to him.  Denies suicidal thoughts or plan.  'i'm not happy any more'.  'i see myself as a not very worthy person'.   Review of Systems For ROS see HPI     Objective:   Physical Exam Vitals reviewed.  Constitutional:      General: She is not in acute distress.    Appearance: Normal appearance. She is obese. She is not ill-appearing.  HENT:     Head: Normocephalic and atraumatic.  Skin:    General: Skin is warm and dry.  Neurological:     General: No focal deficit present.     Mental Status: She is alert and oriented to person, place, and time.  Psychiatric:        Behavior: Behavior normal.     Comments: Flat affect, tangential thought process           Assessment & Plan:

## 2022-06-12 NOTE — Assessment & Plan Note (Signed)
Deteriorated.  Pt reports wishing she would die so her husband could move on and find someone else who wouldn't be such a burden.  She has a very strong faith and denies suicidal thoughts or plan, but doesn't feel she is 'worthy'.  Will increase Wellbutrin to '300mg'$  daily.  Encouraged her to consider counseling but pt doesn't feel she is ready for that right now.  Will continue to follow closely.

## 2022-06-13 ENCOUNTER — Encounter: Payer: Self-pay | Admitting: Bariatrics

## 2022-06-13 ENCOUNTER — Ambulatory Visit (INDEPENDENT_AMBULATORY_CARE_PROVIDER_SITE_OTHER): Payer: Medicare Other | Admitting: Bariatrics

## 2022-06-13 VITALS — BP 151/74 | HR 73 | Temp 98.1°F | Ht 62.0 in | Wt 206.0 lb

## 2022-06-13 DIAGNOSIS — Z6837 Body mass index (BMI) 37.0-37.9, adult: Secondary | ICD-10-CM

## 2022-06-13 DIAGNOSIS — I10 Essential (primary) hypertension: Secondary | ICD-10-CM | POA: Diagnosis not present

## 2022-06-13 DIAGNOSIS — E669 Obesity, unspecified: Secondary | ICD-10-CM | POA: Diagnosis not present

## 2022-06-13 DIAGNOSIS — E88819 Insulin resistance, unspecified: Secondary | ICD-10-CM | POA: Diagnosis not present

## 2022-06-20 ENCOUNTER — Other Ambulatory Visit: Payer: Self-pay | Admitting: Family Medicine

## 2022-06-24 NOTE — Progress Notes (Signed)
Chief Complaint:   OBESITY Vanessa Christensen is here to discuss her progress with her obesity treatment plan along with follow-up of her obesity related diagnoses. Vanessa Christensen is on the Category 3 Plan and states she is following her eating plan approximately 30% of the time. Vanessa Christensen states she is going to the gym 60 minutes 3 times per week.  Today's visit was #: 24 Starting weight: 241 lbs Starting date: 08/13/2018 Today's weight: 206 lbs Today's date: 06/13/2022 Total lbs lost to date: 35 Total lbs lost since last in-office visit: +1  Interim History: Vanessa Christensen is up 1 lb since her last visit over the holidays.  Subjective:   1. Essential hypertension BP slightly elevated today. Vanessa Christensen is not on medication.  2. Insulin resistance Vanessa Christensen is not on medication.  Assessment/Plan:   1. Essential hypertension Increase water intake. No added salt. Will check BP at home.  2. Insulin resistance Vanessa Christensen will curb all carbohydrates (sweets and studies).  3. Obesity, current BMI 37.7 Vanessa Christensen is currently in the action stage of change. As such, her goal is to continue with weight loss efforts. She has agreed to the Category 3 Plan.   Meal planning Intentional eating  Exercise goals:  As is.  Behavioral modification strategies: increasing lean protein intake, decreasing simple carbohydrates, increasing vegetables, and increasing water intake.  Vanessa Christensen has agreed to follow-up with our clinic in 4 weeks. She was informed of the importance of frequent follow-up visits to maximize her success with intensive lifestyle modifications for her multiple health conditions.   Objective:   Blood pressure (!) 151/74, pulse 73, temperature 98.1 F (36.7 C), height '5\' 2"'$  (1.575 m), weight 206 lb (93.4 kg), SpO2 98 %. Body mass index is 37.68 kg/m.  General: Cooperative, alert, well developed, in no acute distress. HEENT: Conjunctivae and lids unremarkable. Cardiovascular: Regular rhythm.  Lungs: Normal  work of breathing. Neurologic: No focal deficits.   Lab Results  Component Value Date   CREATININE 0.71 02/08/2022   BUN 17 02/08/2022   NA 138 02/08/2022   K 4.6 02/08/2022   CL 103 02/08/2022   CO2 26 02/08/2022   Lab Results  Component Value Date   ALT 32 02/08/2022   AST 31 02/08/2022   ALKPHOS 63 02/08/2022   BILITOT 0.6 02/08/2022   Lab Results  Component Value Date   HGBA1C 5.2 12/08/2020   HGBA1C 5.6 01/15/2020   HGBA1C 5.4 11/04/2018   HGBA1C 5.6 08/13/2018   Lab Results  Component Value Date   INSULIN 4.7 12/08/2020   INSULIN 8.0 01/15/2020   INSULIN 10.9 11/04/2018   INSULIN 13.2 08/13/2018   Lab Results  Component Value Date   TSH 1.17 02/08/2022   Lab Results  Component Value Date   CHOL 189 12/08/2020   HDL 47 12/08/2020   LDLCALC 127 (H) 12/08/2020   TRIG 80 12/08/2020   CHOLHDL 4 05/08/2018   Lab Results  Component Value Date   VD25OH 32.84 07/27/2021   VD25OH 74.7 12/08/2020   VD25OH 36.9 01/15/2020   Lab Results  Component Value Date   WBC 3.3 (L) 02/08/2022   HGB 13.5 02/08/2022   HCT 39.9 02/08/2022   MCV 85.4 02/08/2022   PLT 139.0 (L) 02/08/2022   Lab Results  Component Value Date   IRON 51 10/25/2021   TIBC 423 10/25/2021   FERRITIN 34 10/25/2021   Attestation Statements:   Reviewed by clinician on day of visit: allergies, medications, problem list, medical history, surgical history, family  history, social history, and previous encounter notes.  I, Kathlene November, BS, CMA, am acting as transcriptionist for [employer], DO.  I have reviewed the above documentation for accuracy and completeness, and I agree with the above. Jearld Lesch, DO

## 2022-06-26 ENCOUNTER — Encounter: Payer: Self-pay | Admitting: Bariatrics

## 2022-07-04 ENCOUNTER — Other Ambulatory Visit: Payer: Self-pay | Admitting: Family Medicine

## 2022-07-10 ENCOUNTER — Encounter: Payer: Self-pay | Admitting: Family Medicine

## 2022-07-10 ENCOUNTER — Ambulatory Visit (INDEPENDENT_AMBULATORY_CARE_PROVIDER_SITE_OTHER): Payer: Medicare Other | Admitting: Family Medicine

## 2022-07-10 VITALS — BP 128/82 | HR 60 | Temp 97.0°F | Resp 17 | Ht 62.0 in | Wt 207.4 lb

## 2022-07-10 DIAGNOSIS — F32A Depression, unspecified: Secondary | ICD-10-CM | POA: Diagnosis not present

## 2022-07-10 DIAGNOSIS — F419 Anxiety disorder, unspecified: Secondary | ICD-10-CM | POA: Diagnosis not present

## 2022-07-10 MED ORDER — BUPROPION HCL ER (XL) 150 MG PO TB24: 300.0000 mg | ORAL_TABLET | Freq: Every day | ORAL | 1 refills | Status: DC

## 2022-07-10 NOTE — Progress Notes (Signed)
   Subjective:    Patient ID: Vanessa Christensen, female    DOB: 12-25-1950, 72 y.o.   MRN: [id number]  HPI Anxiety/Depression- at last visit we increased Wellbutrin XL to '300mg'$  daily.  Also on Fluoxetine '40mg'$  daily.  'this is absolutely amazing'.  No longer taking naps, energy level has dramatically improved.  Pt is again cooking and cleaning.  No longer feeling that she is holding her husband back.  Wants to change to 2 of the '150mg'$  Wellbutrin pills b/c the '300mg'$  is too large for swallowing   Review of Systems For ROS see HPI     Objective:   Physical Exam Vitals reviewed.  Constitutional:      General: She is not in acute distress.    Appearance: Normal appearance. She is not ill-appearing.  HENT:     Head: Normocephalic and atraumatic.  Eyes:     Extraocular Movements: Extraocular movements intact.     Conjunctiva/sclera: Conjunctivae normal.  Skin:    General: Skin is warm and dry.  Neurological:     General: No focal deficit present.     Mental Status: She is alert and oriented to person, place, and time.  Psychiatric:        Mood and Affect: Mood normal.        Behavior: Behavior normal.        Thought Content: Thought content normal.           Assessment & Plan:

## 2022-07-10 NOTE — Patient Instructions (Signed)
Follow up in 6 months to recheck weight loss progress No med changes at this time but we will change back to 2 of the '150mg'$  Wellbutrin rather than the big '300mg'$  pill Keep up the good work on healthy diet and regular exercise- you're doing great!!! Call with any questions or concerns Stay Safe!  Stay Healthy!! WELCOME BACK!!!

## 2022-07-10 NOTE — Assessment & Plan Note (Signed)
Much improved w/ increased dose of Wellbutrin.  She wants to remain on '300mg'$  daily but finds that it is too large to swallow comfortably.  Will do 2 of the '150mg'$ s daily.  Continue Fluoxetine.  Pt expressed understanding and is in agreement w/ plan.

## 2022-07-12 ENCOUNTER — Encounter: Payer: Self-pay | Admitting: Bariatrics

## 2022-07-12 ENCOUNTER — Ambulatory Visit (INDEPENDENT_AMBULATORY_CARE_PROVIDER_SITE_OTHER): Payer: Medicare Other | Admitting: Bariatrics

## 2022-07-12 ENCOUNTER — Other Ambulatory Visit: Payer: Self-pay | Admitting: Bariatrics

## 2022-07-12 VITALS — BP 163/80 | HR 77 | Temp 97.8°F | Ht 62.0 in | Wt 203.0 lb

## 2022-07-12 DIAGNOSIS — Z6837 Body mass index (BMI) 37.0-37.9, adult: Secondary | ICD-10-CM | POA: Diagnosis not present

## 2022-07-12 DIAGNOSIS — E038 Other specified hypothyroidism: Secondary | ICD-10-CM | POA: Diagnosis not present

## 2022-07-12 DIAGNOSIS — N393 Stress incontinence (female) (male): Secondary | ICD-10-CM

## 2022-07-12 DIAGNOSIS — I10 Essential (primary) hypertension: Secondary | ICD-10-CM | POA: Diagnosis not present

## 2022-07-19 ENCOUNTER — Other Ambulatory Visit: Payer: Self-pay | Admitting: Family Medicine

## 2022-07-19 DIAGNOSIS — E038 Other specified hypothyroidism: Secondary | ICD-10-CM

## 2022-07-21 ENCOUNTER — Telehealth: Payer: Self-pay

## 2022-07-21 NOTE — Telephone Encounter (Signed)
Patient left voice mail for return call about scheduling Left TKA.  I called patient back and left voice mail for return call.

## 2022-07-26 NOTE — Progress Notes (Unsigned)
Chief Complaint:   OBESITY Vanessa Christensen is here to discuss her progress with her obesity treatment plan along with follow-up of her obesity related diagnoses. Vanessa Christensen is on the Category 3 plan and states she is following her eating plan approximately 35-40% of the time. Vanessa Christensen states she exercising at the gym 3-4 times per week.  Today's visit was #: 87 Starting weight: 241 lbs Starting date: 30/10/20 Today's weight: 203 lbs Today's date: 07/12/22 Total lbs lost to date: 38 Total lbs lost since last in-office visit: -3  Interim History: She is down another 3 pounds since her last visit.  She is eating at home more frequently.  She is she is using recipes from her Weight Watcher cookbook.  Subjective:   1. Essential hypertension Taking no medications.  Blood pressure 163/80 today.  2. Other specified hypothyroidism Taking levothyroxine.  Assessment/Plan:   1. Essential hypertension 1.  Will check blood pressure at home and bring in. 2.  No added salt.  2. Other specified hypothyroidism Continue levothyroxine.  3. Morbid obesity, BMI 37.0-37.9, adult 1.  Meal planning 2.  Will adhere closely to the plan.  Vanessa Christensen is currently in the action stage of change. As such, her goal is to continue with weight loss efforts. She has agreed to the Category 3 plan.  Exercise goals: as is  Behavioral modification strategies: increasing lean protein intake, decreasing simple carbohydrates, increasing vegetables, increasing water intake, decreasing eating out, no skipping meals, meal planning and cooking strategies, keeping healthy foods in the home, and planning for success.  Vanessa Christensen has agreed to follow-up with our clinic in 4 weeks. She was informed of the importance of frequent follow-up visits to maximize her success with intensive lifestyle modifications for her multiple health conditions.   Objective:   Blood pressure (!) 163/80, pulse 77, temperature 97.8 F (36.6 C), height 5' 2"$   (1.575 m), weight 203 lb (92.1 kg), SpO2 98 %. Body mass index is 37.13 kg/m.  General: Cooperative, alert, well developed, in no acute distress. HEENT: Conjunctivae and lids unremarkable. Cardiovascular: Regular rhythm.  Lungs: Normal work of breathing. Neurologic: No focal deficits.   Lab Results  Component Value Date   CREATININE 0.71 02/08/2022   BUN 17 02/08/2022   NA 138 02/08/2022   K 4.6 02/08/2022   CL 103 02/08/2022   CO2 26 02/08/2022   Lab Results  Component Value Date   ALT 32 02/08/2022   AST 31 02/08/2022   ALKPHOS 63 02/08/2022   BILITOT 0.6 02/08/2022   Lab Results  Component Value Date   HGBA1C 5.2 12/08/2020   HGBA1C 5.6 01/15/2020   HGBA1C 5.4 11/04/2018   HGBA1C 5.6 08/13/2018   Lab Results  Component Value Date   INSULIN 4.7 12/08/2020   INSULIN 8.0 01/15/2020   INSULIN 10.9 11/04/2018   INSULIN 13.2 08/13/2018   Lab Results  Component Value Date   TSH 1.17 02/08/2022   Lab Results  Component Value Date   CHOL 189 12/08/2020   HDL 47 12/08/2020   LDLCALC 127 (H) 12/08/2020   TRIG 80 12/08/2020   CHOLHDL 4 05/08/2018   Lab Results  Component Value Date   VD25OH 32.84 07/27/2021   VD25OH 74.7 12/08/2020   VD25OH 36.9 01/15/2020   Lab Results  Component Value Date   WBC 3.3 (L) 02/08/2022   HGB 13.5 02/08/2022   HCT 39.9 02/08/2022   MCV 85.4 02/08/2022   PLT 139.0 (L) 02/08/2022   Lab Results  Component  Value Date   IRON 51 10/25/2021   TIBC 423 10/25/2021   FERRITIN 34 10/25/2021    Attestation Statements:   Reviewed by clinician on day of visit: allergies, medications, problem list, medical history, surgical history, family history, social history, and previous encounter notes.  I, Dawn Whitmire, FNP-C, am acting as transcriptionist for Dr. Jearld Lesch.  I have reviewed the above documentation for accuracy and completeness, and I agree with the above. Jearld Lesch, DO

## 2022-07-27 ENCOUNTER — Other Ambulatory Visit: Payer: Self-pay

## 2022-07-27 DIAGNOSIS — Z170 Estrogen receptor positive status [ER+]: Secondary | ICD-10-CM

## 2022-07-30 NOTE — Progress Notes (Unsigned)
Patient Care Team: Vanessa Minium, MD as PCP - General (Family Medicine) Vanessa Apo, MD as Consulting Physician (Ophthalmology) Vanessa Merle, MD as Consulting Physician (Hematology) [place] (Dentistry) Vanessa Feeling, NP as Nurse Practitioner (Oncology) Vanessa Hams, MD as Consulting Physician (Family Medicine) Vanessa Hughs, MD as Consulting Physician (Urology) Vanessa Lopes, DO as Consulting Physician ([place]) Vanessa Christensen, [facility] (Inactive) as Pharmacist (Pharmacist)   CHIEF COMPLAINT: Follow up left breast cancer   Oncology History Overview Note  Breast cancer of upper-outer quadrant of left female breast [facility])   Staging form: Breast, AJCC 7th Edition   - Pathologic stage from 10/31/2006: Stage IIB (T2, N14m, cM0) - Signed by YTruitt Merle MD on 01/11/2016    Breast cancer of upper-outer quadrant of left female breast ([place]  09/25/2006 Initial Biopsy   Left breast 2:00 position mass core needle biopsy showed invasive carcinoma with overlapping features of lobular and ductal carcinoma, grade 2.   09/25/2006 Receptors her2   ER 3+ positive, PR 3+ positive, HER-2 equivocal on IHC, [facility] of [facility].   09/25/2006 Initial Diagnosis   [place] of upper[place]outer quadrant [place]   10/01/2006 Miscellaneous   BRCA1 and BRCA2 gene sequencing was negative for mutation.   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   10/2006 - 10/2016 Anti-estrogen oral therapy   Adjuvant anastrozole '1mg'$  daily    01/10/2007 - 08/08/2007 Chemotherapy   Adriamycin '120mg'$  and Cytoxan '1200mg'$  every 4 weeks for 4 cycles, followed by docetaxel '150mg'$  for 2 cycles.       09/09/2008 Imaging   PET scan showed no evidence of recurrence or metastatic breast cancer.   01/19/2016 Imaging   Bone Density Scan The BMD measured at Femur Neck Right is 0.973 g/cm2 with a T-score of -0.5.   Genetic testing  08/08/2016 Initial Diagnosis   Genetic testing was negative for pathogenic variants in the 80 genes on the Multi-Gene Panel offered by [facility], included sequencing and/or deletion duplication testing of the following genes: ALK, APC, ATM, AXIN2,BAP1,  BARD1, BLM, BMPR1A, BRCA1, BRCA2, BRIP1, CASR, CDC73, CDH1, CDK4, CDKN1B, CDKN1C, CDKN2A (p14ARF), CDKN2A (p16INK4a), CEBPA, CHEK2, DICER1, CIS3L2, EGFR (c.2369C>T, p.Thr790Met variant only), EPCAM (Deletion/duplication testing only), FH, FLCN, GATA2, GPC3, GREM1 (Promoter region deletion/duplication testing only), HOXB13 (c.251G>A, p.Gly84Glu), HRAS, KIT, MAX, MEN1, MET, MITF (c.952G>A, p.Glu318Lys variant only), MLH1, MSH2, MSH6, MUTYH, NBN, NF1, NF2, PALB2, PDGFRA, PHOX2B, PMS2, POLD1, POLE, POT1, PRKAR1A, PTCH1, PTEN, RAD50, RAD51C, RAD51D, RB1, RECQL4, RET, RUNX1, SDHAF2, SDHA (sequence changes only), SDHB, SDHC, SDHD, SMAD4, SMARCA4, SMARCB1, SMARCE1, STK11, SUFU, TERT, TERT, TMEM127, TP53, TSC1, TSC2, VHL, WRN and WT1. Date of test report is 08/04/16.  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 No medical decisions should be made based on this result at this time given its unknown clinical significance.       CURRENT THERAPY: Surveillance   INTERVAL HISTORY [name] for follow up as scheduled. Last seen by me 07/28/21. Imaging 09/06/21 showed an oil cyst in the left chest wall/breast  ROS   Past Medical History:  Diagnosis Date   Anemia    hx of   Anxiety  pt denies   Arthritis    hands and feet   Cancer ([place]) 10/04/2006   breast   Cataracts, both eyes    Depression    Depression    Diverticulosis of colon    Glaucoma    Heart murmur    No work up done 5  years ago   Joint pain    Knee pain    Obesity    OSA on CPAP    Osteoarthritis    Sleep apnea    no CPAP- no longer needed d/t weight loss   Thyroid activity decreased      Past Surgical History:  Procedure Laterality Date   BREAST SURGERY Bilateral    CATARACT EXTRACTION     COLONOSCOPY     GLAUCOMA REPAIR     MASTECTOMY, RADICAL Bilateral    neulasta induced sterile abscesses     THYROIDECTOMY, PARTIAL     TONSILLECTOMY     TOTAL KNEE ARTHROPLASTY Right    TOTAL KNEE REVISION Right 05/07/2020   Procedure: RIGHT TOTAL KNEE REVISION ARTHROPLASTY;  Surgeon: Mcarthur Rossetti, MD;  Location: [facility];  Service: Orthopedics;  Laterality: Right;     Outpatient Encounter Medications as of 07/31/2022  Medication Sig   acetaminophen (TYLENOL) 500 MG tablet Take 1,000 mg by mouth every 6 (six) hours as needed (pain).   Ascorbic Acid (VITAMIN C PO) Take 1 tablet by mouth daily.   aspirin EC 81 MG tablet Take 81 mg by mouth daily. Swallow whole.   B Complex-C (SUPER B COMPLEX PO) Take 1 tablet by mouth daily after supper.   buPROPion (WELLBUTRIN XL) 150 MG 24 hr tablet Take 2 tablets (300 mg total) by mouth daily.   dorzolamide-timolol (COSOPT) 2-0.5 % ophthalmic solution 1 drop 2 (two) times daily.   FLUoxetine (PROZAC) 40 MG capsule TAKE 1 CAPSULE (40 MG TOTAL) BY MOUTH DAILY.   levothyroxine (SYNTHROID) 75 MCG tablet TAKE 1 TABLET BY MOUTH EVERY DAY   psyllium (METAMUCIL) 58.6 % powder Take 1 packet by mouth daily.    traZODone (DESYREL) 50 MG tablet TAKE 1/2 TO 1 TABLET BY MOUTH AT BEDTIME AS NEEDED FOR SLEEP   No facility-administered encounter medications on file as of 07/31/2022.     There were no vitals filed for this visit. There is no height or weight on file to calculate BMI.   PHYSICAL EXAM GENERAL:alert, no distress and comfortable SKIN: no rash  EYES: sclera clear NECK: without mass LYMPH:  no palpable cervical or supraclavicular lymphadenopathy  LUNGS: clear  with normal breathing effort HEART: regular rate & rhythm, no lower extremity edema ABDOMEN: abdomen soft, non-tender and normal bowel sounds NEURO: alert & oriented x 3 with fluent speech, no focal motor/sensory deficits Breast exam:  PAC without erythema    CBC    Component Value Date/Time   WBC 3.3 (L) 02/08/2022 1431   RBC 4.67 02/08/2022 1431   HGB 13.5 02/08/2022 1431   HGB 12.2 10/25/2021 1630   HGB 12.2 07/13/2016 1015   HCT 39.9 02/08/2022 1431   HCT 36.9 07/13/2016 1015   PLT 139.0 (L) 02/08/2022 1431   PLT 164 10/25/2021 1630   PLT 183 07/13/2016 1015   MCV 85.4 02/08/2022 1431   MCV 84.3 07/13/2016 1015   MCH 28.9 10/25/2021 1630   MCHC 33.8 02/08/2022 1431   RDW 14.1 02/08/2022 1431   RDW 15.2 (H) 07/13/2016 1015   LYMPHSABS 1.3 02/08/2022 1431   LYMPHSABS 1.3 07/13/2016 1015  MONOABS 0.5 02/08/2022 1431   MONOABS 0.5 07/13/2016 1015   EOSABS 0.0 02/08/2022 1431   EOSABS 0.0 07/13/2016 1015   BASOSABS 0.0 02/08/2022 1431   BASOSABS 0.0 07/13/2016 1015     CMP     Component Value Date/Time   NA 138 02/08/2022 1431   NA 141 12/08/2020 1150   NA 141 07/13/2016 1015   K 4.6 02/08/2022 1431   K 4.3 07/13/2016 1015   CL 103 02/08/2022 1431   CO2 26 02/08/2022 1431   CO2 24 07/13/2016 1015   GLUCOSE 78 02/08/2022 1431   GLUCOSE 89 07/13/2016 1015   BUN 17 02/08/2022 1431   BUN 21 12/08/2020 1150   BUN 17.2 07/13/2016 1015   CREATININE 0.71 02/08/2022 1431   CREATININE 0.67 07/28/2021 0918   CREATININE 0.7 07/13/2016 1015   CALCIUM 9.7 02/08/2022 1431   CALCIUM 9.6 07/13/2016 1015   PROT 7.3 02/08/2022 1431   PROT 6.8 12/08/2020 1150   PROT 6.7 07/13/2016 1015   ALBUMIN 4.4 02/08/2022 1431   ALBUMIN 4.8 (H) 12/08/2020 1150   ALBUMIN 4.0 07/13/2016 1015   AST 31 02/08/2022 1431   AST 21 07/28/2021 0918   AST 22 07/13/2016 1015   ALT 32 02/08/2022 1431   ALT 22 07/28/2021 0918   ALT 28 07/13/2016 1015   ALKPHOS 63 02/08/2022 1431   ALKPHOS 70  07/13/2016 1015   BILITOT 0.6 02/08/2022 1431   BILITOT 0.5 07/28/2021 0918   BILITOT 0.57 07/13/2016 1015   GFRNONAA >60 07/28/2021 0918   GFRAA 101 01/15/2020 1254     ASSESSMENT & PLAN:  PLAN:  No orders of the defined types were placed in this encounter.     All questions were answered. The patient knows to call the clinic with any problems, questions or concerns. No barriers to learning were detected. I spent *** counseling the patient face to face. The total time spent in the appointment was *** and more than 50% was on counseling, review of test results, and coordination of care.   Vanessa Rue, NP-C '@DATE'$ @

## 2022-07-31 ENCOUNTER — Inpatient Hospital Stay: Payer: Medicare Other | Attending: Nurse Practitioner

## 2022-07-31 ENCOUNTER — Encounter: Payer: Self-pay | Admitting: Nurse Practitioner

## 2022-07-31 ENCOUNTER — Inpatient Hospital Stay (HOSPITAL_BASED_OUTPATIENT_CLINIC_OR_DEPARTMENT_OTHER): Payer: Medicare Other | Admitting: Nurse Practitioner

## 2022-07-31 VITALS — BP 116/65 | HR 82 | Temp 97.8°F | Resp 17 | Ht 62.0 in | Wt 207.4 lb

## 2022-07-31 DIAGNOSIS — Z9013 Acquired absence of bilateral breasts and nipples: Secondary | ICD-10-CM | POA: Diagnosis not present

## 2022-07-31 DIAGNOSIS — W1831XA Fall on same level due to stepping on an object, initial encounter: Secondary | ICD-10-CM | POA: Insufficient documentation

## 2022-07-31 DIAGNOSIS — Z170 Estrogen receptor positive status [ER+]: Secondary | ICD-10-CM | POA: Insufficient documentation

## 2022-07-31 DIAGNOSIS — Z9221 Personal history of antineoplastic chemotherapy: Secondary | ICD-10-CM | POA: Diagnosis not present

## 2022-07-31 DIAGNOSIS — D649 Anemia, unspecified: Secondary | ICD-10-CM | POA: Diagnosis not present

## 2022-07-31 DIAGNOSIS — S0011XA Contusion of right eyelid and periocular area, initial encounter: Secondary | ICD-10-CM | POA: Insufficient documentation

## 2022-07-31 DIAGNOSIS — Z853 Personal history of malignant neoplasm of breast: Secondary | ICD-10-CM | POA: Diagnosis not present

## 2022-07-31 DIAGNOSIS — Z9229 Personal history of other drug therapy: Secondary | ICD-10-CM | POA: Diagnosis not present

## 2022-07-31 DIAGNOSIS — Y9389 Activity, other specified: Secondary | ICD-10-CM | POA: Diagnosis not present

## 2022-07-31 DIAGNOSIS — S0501XA Injury of conjunctiva and corneal abrasion without foreign body, right eye, initial encounter: Secondary | ICD-10-CM | POA: Diagnosis not present

## 2022-07-31 DIAGNOSIS — Z08 Encounter for follow-up examination after completed treatment for malignant neoplasm: Secondary | ICD-10-CM | POA: Diagnosis not present

## 2022-07-31 DIAGNOSIS — C50412 Malignant neoplasm of upper-outer quadrant of left female breast: Secondary | ICD-10-CM

## 2022-07-31 LAB — CMP (CANCER CENTER ONLY)
ALT: 26 U/L (ref 0–44)
AST: 22 U/L (ref 15–41)
Albumin: 4.4 g/dL (ref 3.5–5.0)
Alkaline Phosphatase: 67 U/L (ref 38–126)
Anion gap: 6 (ref 5–15)
BUN: 19 mg/dL (ref 8–23)
CO2: 27 mmol/L (ref 22–32)
Calcium: 8.8 mg/dL — ABNORMAL LOW (ref 8.9–10.3)
Chloride: 106 mmol/L (ref 98–111)
Creatinine: 0.73 mg/dL (ref 0.44–1.00)
GFR, Estimated: >60 mL/min (ref >60)
Glucose, Bld: 81 mg/dL (ref 70–99)
Potassium: 4 mmol/L (ref 3.5–5.1)
Sodium: 139 mmol/L (ref 135–145)
Total Bilirubin: 0.6 mg/dL (ref 0.3–1.2)
Total Protein: 7 g/dL (ref 6.5–8.1)

## 2022-07-31 LAB — CBC WITH DIFFERENTIAL (CANCER CENTER ONLY)
Abs Immature Granulocytes: 0.01 10*3/uL (ref 0.00–0.07)
Basophils Absolute: 0 10*3/uL (ref 0.0–0.1)
Basophils Relative: 1 %
Eosinophils Absolute: 0.2 10*3/uL (ref 0.0–0.5)
Eosinophils Relative: 4 %
HCT: 37.3 % (ref 36.0–46.0)
Hemoglobin: 12.2 g/dL (ref 12.0–15.0)
Immature Granulocytes: 0 %
Lymphocytes Relative: 29 %
Lymphs Abs: 1.2 10*3/uL (ref 0.7–4.0)
MCH: 29.3 pg (ref 26.0–34.0)
MCHC: 32.7 g/dL (ref 30.0–36.0)
MCV: 89.7 fL (ref 80.0–100.0)
Monocytes Absolute: 0.7 10*3/uL (ref 0.1–1.0)
Monocytes Relative: 18 %
Neutro Abs: 2 10*3/uL (ref 1.7–7.7)
Neutrophils Relative %: 48 %
Platelet Count: 159 10*3/uL (ref 150–400)
RBC: 4.16 MIL/uL (ref 3.87–5.11)
RDW: 13.7 % (ref 11.5–15.5)
WBC Count: 4.1 10*3/uL (ref 4.0–10.5)
nRBC: 0 % (ref 0.0–0.2)

## 2022-07-31 LAB — IRON AND IRON BINDING CAPACITY (CC-WL,HP ONLY)
Iron: 57 ug/dL (ref 28–170)
Saturation Ratios: 12 % (ref 10.4–31.8)
TIBC: 484 ug/dL — ABNORMAL HIGH (ref 250–450)
UIBC: 427 ug/dL (ref 148–442)

## 2022-07-31 LAB — FERRITIN: Ferritin: 18 ng/mL (ref 11–307)

## 2022-08-01 DIAGNOSIS — S0231XA Fracture of orbital floor, right side, initial encounter for closed fracture: Secondary | ICD-10-CM | POA: Diagnosis not present

## 2022-08-09 ENCOUNTER — Ambulatory Visit: Payer: Medicare Other | Admitting: Orthopaedic Surgery

## 2022-08-09 ENCOUNTER — Ambulatory Visit (INDEPENDENT_AMBULATORY_CARE_PROVIDER_SITE_OTHER): Payer: Medicare Other | Admitting: Bariatrics

## 2022-08-09 ENCOUNTER — Encounter: Payer: Self-pay | Admitting: Bariatrics

## 2022-08-09 VITALS — BP 130/76 | HR 69 | Temp 97.6°F | Ht 62.0 in | Wt 206.0 lb

## 2022-08-09 DIAGNOSIS — E038 Other specified hypothyroidism: Secondary | ICD-10-CM

## 2022-08-09 DIAGNOSIS — I10 Essential (primary) hypertension: Secondary | ICD-10-CM | POA: Diagnosis not present

## 2022-08-09 DIAGNOSIS — Z6837 Body mass index (BMI) 37.0-37.9, adult: Secondary | ICD-10-CM | POA: Diagnosis not present

## 2022-08-10 ENCOUNTER — Encounter: Payer: Self-pay | Admitting: Radiology

## 2022-08-14 DIAGNOSIS — S0231XA Fracture of orbital floor, right side, initial encounter for closed fracture: Secondary | ICD-10-CM | POA: Diagnosis not present

## 2022-08-22 NOTE — Progress Notes (Signed)
Chief Complaint:   OBESITY Vanessa Christensen is here to discuss her progress with her obesity treatment plan along with follow-up of her obesity related diagnoses. Vanessa Christensen is on the Category 3 plan and states she is following her eating plan approximately 40% of the time. Vanessa Christensen states she has not been exercising.  Today's visit was #: 39 Starting weight: 241 lbs Starting date: 08/13/18 Today's weight: 206 lbs Today's date: 08/09/22 Total lbs lost to date: 35 Total lbs lost since last in-office visit: +3  Interim History: She is up 3 pounds since her last visit.  She has been going out to dinner.  Subjective:   1. Essential hypertension Blood pressure 169/90 today.  Repeat with large cuff-130/76. 07/31/2022-BP 116/65.  2. Other specified hypothyroidism Taking Synthroid.   Assessment/Plan:   1. Essential hypertension 1.  Continue medications. 2.  Will take blood pressure at home.  2. Other specified hypothyroidism Continue Synthroid.  3. Morbid obesity (HCC) BMI 37.0-37.9, adult 1.  Meal planning. 2.  Will eat out less and more cooking. 3.  She will take snacks.  Vanessa Christensen is currently in the action stage of change. As such, her goal is to continue with weight loss efforts. She has agreed to the Category 3 plan.  Exercise goals: Has fallen and no exercise at this time.  Behavioral modification strategies: increasing lean protein intake, decreasing simple carbohydrates, increasing vegetables, increasing water intake, decreasing eating out, no skipping meals, meal planning and cooking strategies, keeping healthy foods in the home, and planning for success.  Vanessa Christensen has agreed to follow-up with our clinic in 4 weeks. She was informed of the importance of frequent follow-up visits to maximize her success with intensive lifestyle modifications for her multiple health conditions.   Objective:   Blood pressure 130/76, pulse 69, temperature 97.6 F (36.4 C), height 5\' 2"  (1.575 m),  weight 206 lb (93.4 kg), SpO2 99 %. Body mass index is 37.68 kg/m.  General: Cooperative, alert, well developed, in no acute distress. HEENT: Conjunctivae and lids unremarkable. Cardiovascular: Regular rhythm.  Lungs: Normal work of breathing. Neurologic: No focal deficits.   Lab Results  Component Value Date   CREATININE 0.73 07/31/2022   BUN 19 07/31/2022   NA 139 07/31/2022   K 4.0 07/31/2022   CL 106 07/31/2022   CO2 27 07/31/2022   Lab Results  Component Value Date   ALT 26 07/31/2022   AST 22 07/31/2022   ALKPHOS 67 07/31/2022   BILITOT 0.6 07/31/2022   Lab Results  Component Value Date   HGBA1C 5.2 12/08/2020   HGBA1C 5.6 01/15/2020   HGBA1C 5.4 11/04/2018   HGBA1C 5.6 08/13/2018   Lab Results  Component Value Date   INSULIN 4.7 12/08/2020   INSULIN 8.0 01/15/2020   INSULIN 10.9 11/04/2018   INSULIN 13.2 08/13/2018   Lab Results  Component Value Date   TSH 1.17 02/08/2022   Lab Results  Component Value Date   CHOL 189 12/08/2020   HDL 47 12/08/2020   LDLCALC 127 (H) 12/08/2020   TRIG 80 12/08/2020   CHOLHDL 4 05/08/2018   Lab Results  Component Value Date   VD25OH 32.84 07/27/2021   VD25OH 74.7 12/08/2020   VD25OH 36.9 01/15/2020   Lab Results  Component Value Date   WBC 4.1 07/31/2022   HGB 12.2 07/31/2022   HCT 37.3 07/31/2022   MCV 89.7 07/31/2022   PLT 159 07/31/2022   Lab Results  Component Value Date   IRON 57  07/31/2022   TIBC 484 (H) 07/31/2022   FERRITIN 18 07/31/2022    Attestation Statements:   Reviewed by clinician on day of visit: allergies, medications, problem list, medical history, surgical history, family history, social history, and previous encounter notes.  I, Dawn Whitmire, FNP-C, am acting as transcriptionist for Dr. Jearld Lesch.  I have reviewed the above documentation for accuracy and completeness, and I agree with the above. Jearld Lesch, DO

## 2022-08-23 DIAGNOSIS — S0231XD Fracture of orbital floor, right side, subsequent encounter for fracture with routine healing: Secondary | ICD-10-CM | POA: Diagnosis not present

## 2022-08-23 DIAGNOSIS — H401131 Primary open-angle glaucoma, bilateral, mild stage: Secondary | ICD-10-CM | POA: Diagnosis not present

## 2022-09-06 ENCOUNTER — Ambulatory Visit (INDEPENDENT_AMBULATORY_CARE_PROVIDER_SITE_OTHER): Payer: Medicare Other | Admitting: Orthopaedic Surgery

## 2022-09-06 VITALS — Wt 207.0 lb

## 2022-09-06 DIAGNOSIS — M25562 Pain in left knee: Secondary | ICD-10-CM | POA: Diagnosis not present

## 2022-09-06 DIAGNOSIS — G8929 Other chronic pain: Secondary | ICD-10-CM | POA: Diagnosis not present

## 2022-09-06 DIAGNOSIS — M1712 Unilateral primary osteoarthritis, left knee: Secondary | ICD-10-CM

## 2022-09-06 NOTE — Progress Notes (Signed)
The patient is a 72 year old female well-known to me.  She is actually scheduled for a left knee replacement in May of this year.  This will be on May 24.  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  She has a history of a right revision knee arthroplasty.  We wanted to see her today to make sure everything was doing well with her from a health standpoint.  Today's BMI is 37.86.  She is not diabetic.  She has worked on activity modification, quad strengthening exercises, and anti-inflammatories and resting the knee when she can.  At this point her left knee pain is daily and it is detriment affecting her mobility, her quality of life and actives daily living.  I was able to review all of her medications and past medical history within epic as well.  Examination of her left knee shows varus malalignment that is correctable.  She has a mild effusion.  She has significant medial joint line tenderness and patellofemoral tenderness with crepitation throughout the arc of motion of the knee.  Previous x-rays of her left knee were reviewed again and show medial bone-on-bone wear and patellofemoral wear with varus malalignment and osteophytes in all 3 compartments.  We will proceed with a left total knee arthroplasty on May 24 of this year.  I showed her knee replacement model and again described what the surgery involves as well as discussed the risk and benefits of surgery.  All questions concerns were answered and addressed.  We will see her the day of surgery.

## 2022-09-07 ENCOUNTER — Encounter: Payer: Self-pay | Admitting: Bariatrics

## 2022-09-07 ENCOUNTER — Ambulatory Visit (INDEPENDENT_AMBULATORY_CARE_PROVIDER_SITE_OTHER): Payer: Medicare Other | Admitting: Bariatrics

## 2022-09-07 VITALS — BP 138/85 | HR 74 | Temp 97.9°F | Ht 62.0 in | Wt 204.0 lb

## 2022-09-07 DIAGNOSIS — E669 Obesity, unspecified: Secondary | ICD-10-CM

## 2022-09-07 DIAGNOSIS — Z6837 Body mass index (BMI) 37.0-37.9, adult: Secondary | ICD-10-CM

## 2022-09-07 DIAGNOSIS — E88819 Insulin resistance, unspecified: Secondary | ICD-10-CM

## 2022-09-07 NOTE — Progress Notes (Signed)
   WEIGHT SUMMARY AND BIOMETRICS  Weight Lost Since Last Visit: 2lb   Vitals Temp: 97.9 F (36.6 C) BP: 138/85 Pulse Rate: 74 SpO2: 97 %   Anthropometric Measurements Height: 5\' 2"  (1.575 m) Weight: 204 lb (92.5 kg) BMI (Calculated): 37.3 Weight at Last Visit: 206lb Weight Lost Since Last Visit: 2lb Weight Gained Since Last Visit: 0 Starting Weight: 241lb Total Weight Loss (lbs): 37 lb (16.8 kg)   Body Composition  Body Fat %: 47.6 % Fat Mass (lbs): 97.4 lbs Muscle Mass (lbs): 101.8 lbs Total Body Water (lbs): 74.2 lbs Visceral Fat Rating : 16   Other Clinical Data Fasting: no Labs: no Today's Visit #: 15 Starting Date: 08/13/18    OBESITY Vanessa Christensen is here to discuss her progress with her obesity treatment plan along with follow-up of her obesity related diagnoses.     Nutrition Plan: the Category 3 plan - 40% adherence.  Current exercise:  Gym  Interim History:  She is down another 2 lbs since her last visit.  Protein intake is less than prescribed. and Not meeting protein goals.   Hunger is well controlled.  Cravings are moderately controlled.  Assessment/Plan:   Insulin Resistance Vanessa Christensen has had elevated fasting insulin readings. Goal is HgbA1c < 5.7, fasting insulin at l0 or less, and preferably at 5.  She reports  denies polyphagia. Medication(s): none Lab Results  Component Value Date   HGBA1C 5.2 12/08/2020   Lab Results  Component Value Date   INSULIN 4.7 12/08/2020   INSULIN 8.0 01/15/2020   INSULIN 10.9 11/04/2018   INSULIN 13.2 08/13/2018    Plan Will work on the agreed upon plan. Will minimize refined carbohydrates ( sweets and starches), and focus more on complex carbohydrates.  Increase the micronutrients found in leafy greens, which include magnesium, polyphenols, and vitamin C which have been postulated to help with insulin sensitivity. Minimize "fast food" and cook more meals at home.  Increase fiber to 25 to 30  grams daily.  Information sheet on " Insulin Resistance and Prediabetes".      Generalized Obesity: Current BMI BMI (Calculated): 37.3    Vanessa Christensen is currently in the action stage of change. As such, her goal is to continue with weight loss efforts.  She has agreed to the Category 3 plan.  Exercise goals: Older adults should determine their level of effort for physical activity relative to their level of fitness.   Behavioral modification strategies: increasing lean protein intake, no meal skipping, planning for success, and increasing fiber rich foods.  Vanessa Christensen has agreed to follow-up with our clinic in 4 weeks.       Objective:   VITALS: Per patient if applicable, see vitals. GENERAL: Alert and in no acute distress. CARDIOPULMONARY: No increased WOB. Speaking in clear sentences.  PSYCH: Pleasant and cooperative. Speech normal rate and rhythm. Affect is appropriate. Insight and judgement are appropriate. Attention is focused, linear, and appropriate.  NEURO: Oriented as arrived to appointment on time with no prompting.   Attestation Statements:    This was prepared with the assistance of Presenter, broadcasting.  Occasional wrong-word or sound-a-like substitutions may have occurred due to the inherent limitations of voice recognition software.   Jearld Lesch, DO

## 2022-09-15 ENCOUNTER — Other Ambulatory Visit: Payer: Self-pay | Admitting: Family Medicine

## 2022-10-04 ENCOUNTER — Telehealth: Payer: Self-pay | Admitting: Family Medicine

## 2022-10-04 NOTE — Telephone Encounter (Signed)
Contacted Vanessa Christensen to schedule their annual wellness visit. Appointment made for 10/11/2022.  Thank you,  [facility] Support [facility] Direct dial  [phone]

## 2022-10-05 ENCOUNTER — Encounter: Payer: Self-pay | Admitting: Bariatrics

## 2022-10-05 ENCOUNTER — Ambulatory Visit (INDEPENDENT_AMBULATORY_CARE_PROVIDER_SITE_OTHER): Payer: Medicare Other | Admitting: Bariatrics

## 2022-10-05 VITALS — BP 147/80 | HR 77 | Temp 98.5°F | Ht 62.0 in | Wt 206.0 lb

## 2022-10-05 DIAGNOSIS — E88819 Insulin resistance, unspecified: Secondary | ICD-10-CM

## 2022-10-05 DIAGNOSIS — E669 Obesity, unspecified: Secondary | ICD-10-CM | POA: Diagnosis not present

## 2022-10-05 DIAGNOSIS — Z6837 Body mass index (BMI) 37.0-37.9, adult: Secondary | ICD-10-CM | POA: Diagnosis not present

## 2022-10-09 ENCOUNTER — Other Ambulatory Visit: Payer: Self-pay | Admitting: Family Medicine

## 2022-10-10 NOTE — Progress Notes (Unsigned)
Chief Complaint:   OBESITY Vanessa Christensen is here to discuss her progress with her obesity treatment plan along with follow-up of her obesity related diagnoses. Vanessa Christensen is on the Category 3 Plan and states she is following her eating plan approximately 45% of the time. Vanessa Christensen states she is at the gym for 60 minutes 2 times per week.  Today's visit was #: 67 Starting weight: 241 lbs Starting date: 08/13/2018 Today's weight: 206 lbs Today's date: 10/05/2022 Total lbs lost to date: 35 Total lbs lost since last in-office visit: 0  Interim History: Vanessa Christensen is up 2 pounds since her last visit.  She had a card game and she had a lot of good food.  Subjective:   1. Insulin resistance Vanessa Christensen is not on any specific medications.  Assessment/Plan:   1. Insulin resistance Vanessa Christensen will continue to keep all carbohydrates low (starches and sweets).  She will keep her water, protein, and fiber intake high.  2. Generalized obesity  3. BMI 37.0-37.9, adult Vanessa Christensen is currently in the action stage of change. As such, her goal is to continue with weight loss efforts. She has agreed to the Category 3 Plan.   Meal planning and intentional eating were discussed.  Journaling.  She will use her weight watchers recipes.  Exercise goals: As is.   Behavioral modification strategies: increasing lean protein intake, decreasing simple carbohydrates, increasing vegetables, increasing water intake, decreasing eating out, no skipping meals, meal planning and cooking strategies, keeping healthy foods in the home, and planning for success.  Vanessa Christensen has agreed to follow-up with our clinic in 4 weeks. She was informed of the importance of frequent follow-up visits to maximize her success with intensive lifestyle modifications for her multiple health conditions.   Objective:   Blood pressure (!) 147/80, pulse 77, temperature 98.5 F (36.9 C), height 5\' 2"  (1.575 m), weight 206 lb (93.4 kg), SpO2 98 %. Body mass index is  37.68 kg/m.  General: Cooperative, alert, well developed, in no acute distress. HEENT: Conjunctivae and lids unremarkable. Cardiovascular: Regular rhythm.  Lungs: Normal work of breathing. Neurologic: No focal deficits.   Lab Results  Component Value Date   CREATININE 0.73 07/31/2022   BUN 19 07/31/2022   NA 139 07/31/2022   K 4.0 07/31/2022   CL 106 07/31/2022   CO2 27 07/31/2022   Lab Results  Component Value Date   ALT 26 07/31/2022   AST 22 07/31/2022   ALKPHOS 67 07/31/2022   BILITOT 0.6 07/31/2022   Lab Results  Component Value Date   HGBA1C 5.2 12/08/2020   HGBA1C 5.6 01/15/2020   HGBA1C 5.4 11/04/2018   HGBA1C 5.6 08/13/2018   Lab Results  Component Value Date   INSULIN 4.7 12/08/2020   INSULIN 8.0 01/15/2020   INSULIN 10.9 11/04/2018   INSULIN 13.2 08/13/2018   Lab Results  Component Value Date   TSH 1.17 02/08/2022   Lab Results  Component Value Date   CHOL 189 12/08/2020   HDL 47 12/08/2020   LDLCALC 127 (H) 12/08/2020   TRIG 80 12/08/2020   CHOLHDL 4 05/08/2018   Lab Results  Component Value Date   VD25OH 32.84 07/27/2021   VD25OH 74.7 12/08/2020   VD25OH 36.9 01/15/2020   Lab Results  Component Value Date   WBC 4.1 07/31/2022   HGB 12.2 07/31/2022   HCT 37.3 07/31/2022   MCV 89.7 07/31/2022   PLT 159 07/31/2022   Lab Results  Component Value Date   IRON 57  07/31/2022   TIBC 484 (H) 07/31/2022   FERRITIN 18 07/31/2022   Attestation Statements:   Reviewed by clinician on day of visit: allergies, medications, problem list, medical history, surgical history, family history, social history, and previous encounter notes.   Trude Mcburney, am acting as Energy manager for [employer], DO.  I have reviewed the above documentation for accuracy and completeness, and I agree with the above. Corinna Capra, DO

## 2022-10-11 ENCOUNTER — Encounter: Payer: Self-pay | Admitting: Bariatrics

## 2022-10-11 ENCOUNTER — Ambulatory Visit (INDEPENDENT_AMBULATORY_CARE_PROVIDER_SITE_OTHER): Payer: Medicare Other | Admitting: *Deleted

## 2022-10-11 DIAGNOSIS — Z0000 Encounter for general adult medical examination without abnormal findings: Secondary | ICD-10-CM | POA: Diagnosis not present

## 2022-10-11 NOTE — Progress Notes (Signed)
Subjective:   Vanessa Christensen is a 72 y.o. female who presents for Medicare Annual (Subsequent) preventive examination.  I connected with  Hamdi Defrancis on 10/11/22 by a telephone enabled telemedicine application and verified that I am speaking with the correct person using two identifiers.   I discussed the limitations of evaluation and management by telemedicine. The patient expressed understanding and agreed to proceed.  Patient location: home  Provider location: telephone/home    Review of Systems     Cardiac Risk Factors include: advanced age (>64men, >18 women);hypertension;family history of premature cardiovascular disease;obesity (BMI >30kg/m2)     Objective:    Today's Vitals   There is no height or weight on file to calculate BMI.     10/11/2022   11:59 AM 02/10/2022   11:04 AM 10/06/2021    2:10 PM 07/05/2020   11:20 AM 05/07/2020    7:30 PM 05/05/2020    2:16 PM 07/02/2019    3:18 PM  Advanced Directives  Does Patient Have a Medical Advance Directive? Yes Yes Yes Yes Yes Yes Yes  Type of Estate agent of [employer] Power of [place];Living will Healthcare Power of [employer] of [place];Living will Healthcare Power of [place];Living will Living will;Healthcare Power of Attorney  Does patient want to make changes to medical advance directive? No - Patient declined    No - Patient declined  No - Patient declined  Copy of Healthcare Power of Attorney in Chart? No - copy requested  No - copy requested Yes - validated most recent copy scanned in chart (See row information) No - copy requested No - copy requested Yes - validated most recent copy scanned in chart (See row information)    Current Medications (verified) Outpatient Encounter Medications as of 10/11/2022  Medication Sig   acetaminophen (TYLENOL) 500 MG tablet Take 1,000 mg by mouth every 6 (six) hours as needed (pain).   Ascorbic Acid (VITAMIN C PO) Take 1 tablet by  mouth daily.   aspirin EC 81 MG tablet Take 81 mg by mouth daily. Swallow whole.   B Complex-C (SUPER B COMPLEX PO) Take 1 tablet by mouth daily after supper.   buPROPion (WELLBUTRIN XL) 150 MG 24 hr tablet Take 2 tablets (300 mg total) by mouth daily.   dorzolamide-timolol (COSOPT) 2-0.5 % ophthalmic solution 1 drop 2 (two) times daily.   FLUoxetine (PROZAC) 40 MG capsule TAKE 1 CAPSULE (40 MG TOTAL) BY MOUTH DAILY.   levothyroxine (SYNTHROID) 75 MCG tablet TAKE 1 TABLET BY MOUTH EVERY DAY   psyllium (METAMUCIL) 58.6 % powder Take 1 packet by mouth daily.    traZODone (DESYREL) 50 MG tablet TAKE 1/2 TO 1 TABLET BY MOUTH AT BEDTIME AS NEEDED FOR SLEEP   No facility-administered encounter medications on file as of 10/11/2022.    Allergies (verified) Neulasta [[email or web address]]   History: Past Medical History:  Diagnosis Date   Anemia    hx of   Anxiety    pt denies   Arthritis    hands and feet   Cancer (HCC) 10/04/2006   breast   Cataracts, both eyes    Depression    Depression    Diverticulosis of colon    Glaucoma    Heart murmur    No work up done 5 years ago   Joint pain    Knee pain    Obesity    OSA on CPAP    Osteoarthritis    Sleep apnea  no CPAP- no longer needed d/t weight loss   Thyroid activity decreased    Past Surgical History:  Procedure Laterality Date   BREAST SURGERY Bilateral    CATARACT EXTRACTION     COLONOSCOPY     GLAUCOMA REPAIR     MASTECTOMY, RADICAL Bilateral    neulasta induced sterile abscesses     THYROIDECTOMY, PARTIAL     TONSILLECTOMY     TOTAL KNEE ARTHROPLASTY Right    TOTAL KNEE REVISION Right 05/07/2020   Procedure: RIGHT TOTAL KNEE REVISION ARTHROPLASTY;  Surgeon: [name], MD;  Location: [facility];  Service: Orthopedics;  Laterality: Right;   Family History  Problem Relation Age of Onset   CVA Mother    Cancer Mother 31       breast cancer    Heart disease Mother    Stroke Mother    Obesity Mother     Leukemia Father    High blood pressure Father    High Cholesterol Father    Heart disease Father    Cancer Maternal Aunt 61       breast cancer    Cancer Maternal Aunt 55       breast cancer   Colon cancer Maternal Aunt    Cancer Cousin 32       breast cancer   Esophageal cancer Neg Hx    Rectal cancer Neg Hx    Stomach cancer Neg Hx    Social History   Socioeconomic History   Marital status: Married    Spouse name: Marquasha Duplechain   Number of children: 2   Years of education: Not on file   Highest education level: Not on file  Occupational History   Occupation: Retired    Comment: Runner, broadcasting/film/video  Tobacco Use   Smoking status: Former    Types: Cigarettes    Quit date: 11/03/1978    Years since quitting: 43.9   Smokeless tobacco: Never  Vaping Use   Vaping Use: Never used  Substance and Sexual Activity   Alcohol use: No   Drug use: No   Sexual activity: Not Currently    Birth control/protection: Post-menopausal  Other Topics Concern   Not on file  Social History Narrative   Enjoys taking care of grandchildren    Social Determinants of Health   Financial Resource Strain: Low Risk  (10/11/2022)   Overall Financial Resource Strain (CARDIA)    Difficulty of Paying Living Expenses: Not hard at all  Food Insecurity: No Food Insecurity (10/11/2022)   Hunger Vital Sign    Worried About Running Out of Food in the Last Year: Never true    Ran Out of Food in the Last Year: Never true  Transportation Needs: No Transportation Needs (10/11/2022)   PRAPARE - Administrator, Civil Service (Medical): No    Lack of Transportation (Non-Medical): No  Physical Activity: Insufficiently Active (10/11/2022)   Exercise Vital Sign    Days of Exercise per Week: 3 days    Minutes of Exercise per Session: 30 min  Stress: No Stress Concern Present (10/11/2022)   [employer] of Occupational Health - Occupational Stress Questionnaire    Feeling of Stress : Not at all  Social Connections:  Socially Integrated (10/11/2022)   Social Connection and Isolation Panel [NHANES]    Frequency of Communication with Friends and Family: Three times a week    Frequency of Social Gatherings with Friends and Family: More than three times a week  Attends Religious Services: More than 4 times per year    Active Member of Clubs or Organizations: Yes    Attends Engineer, structural: More than 4 times per year    Marital Status: Married    Tobacco Counseling Counseling given: Not Answered   Clinical Intake:  Pre-visit preparation completed: Yes  Pain : No/denies pain     Diabetes: No  How often do you need to have someone help you when you read instructions, pamphlets, or other written materials from your doctor or pharmacy?: 2 - Rarely  Diabetic?  no  Interpreter Needed?: No  Information entered by :: Remi Haggard LPN   Activities of Daily Living    10/11/2022   11:41 AM 10/09/2022   12:37 PM  In your present state of health, do you have any difficulty performing the following activities:  Hearing? 0 0  Vision? 0 0  Difficulty concentrating or making decisions? 0 0  Walking or climbing stairs? 1 1  Dressing or bathing? 0 0  Doing errands, shopping? 0 0  Preparing Food and eating ? N N  Using the Toilet? Y Y  In the past six months, have you accidently leaked urine? Y Y  Do you have problems with loss of bowel control? N N  Managing your Medications? N N  Managing your Finances? N N  Housekeeping or managing your Housekeeping? N N    Patient Care Team: Sheliah Hatch, MD as PCP - General (Family Medicine) Antony Contras, MD as Consulting Physician (Ophthalmology) Malachy Mood, MD as Consulting Physician (Hematology) [place] (Dentistry) Pollyann Samples, NP as Nurse Practitioner (Oncology) Rodolph Bong, MD as Consulting Physician (Family Medicine) Crist Fat, MD as Consulting Physician (Urology) [place] Nickel, DO as Consulting Physician  (Bariatrics) Dahlia Byes, [facility] (Inactive) as Pharmacist (Pharmacist)  Indicate any recent Medical Services you may have received from other than [facility] providers in the past year (date may be approximate).     Assessment:   This is a routine wellness examination for [place].  Hearing/Vision screen Hearing Screening - Comments:: No trouble hearing Vision Screening - Comments:: Up to date [facility]  Dietary issues and exercise activities discussed: Current Exercise Habits: Structured exercise class, Type of exercise: strength training/weights;walking;stretching, Time (Minutes): 35, Frequency (Times/Week): 2, Weekly Exercise (Minutes/Week): 70, Intensity: Mild, Exercise limited by: orthopedic condition(s)   Goals Addressed             This Visit's Progress    Increase physical activity       Is having knee replacements       Depression Screen    10/11/2022   11:43 AM 07/10/2022    9:39 AM 06/12/2022   10:37 AM 03/10/2022    9:28 AM 02/08/2022    1:59 PM 10/06/2021    2:11 PM 10/06/2021    2:09 PM  PHQ 2/9 Scores  PHQ - 2 Score 0 0 2 0 4 0 0  PHQ- 9 Score 3 2 10  0 14      Fall Risk    10/11/2022   11:41 AM 10/09/2022   12:37 PM 07/10/2022    9:39 AM 06/12/2022   10:37 AM 03/10/2022    9:28 AM  Fall Risk   Falls in the past year? 1 1 0 0 0  Number falls in past yr: 1 1 0 0   Injury with Fall? 1 1 0 0   Risk for fall due to : History of fall(s)  No Fall Risks No Fall Risks  Follow up Falls evaluation completed;Education provided;Falls prevention discussed   Falls evaluation completed Falls evaluation completed    FALL RISK PREVENTION PERTAINING TO THE HOME:  Any stairs in or around the home? No  If so, are there any without handrails? No  Home free of loose throw rugs in walkways, pet beds, electrical cords, etc? Yes  Adequate lighting in your home to reduce risk of falls? Yes   ASSISTIVE DEVICES UTILIZED TO PREVENT FALLS:  Life alert? No  Use of a cane, walker or w/c? No  Grab  bars in the bathroom? Yes  Shower chair or bench in shower? Yes  Elevated toilet seat or a handicapped toilet? No   TIMED UP AND GO:  Was the test performed? No .    Cognitive Function:    05/09/2017   10:29 AM  MMSE - Mini Mental State Exam  Orientation to time 5  Orientation to Place 5  Registration 3  Attention/ Calculation 5  Recall 3  Language- name 2 objects 2  Language- repeat 1  Language- follow 3 step command 3  Language- read & follow direction 1  Write a sentence 1  Copy design 1  Total score 30      03/20/2022   10:36 AM  Montreal Cognitive Assessment   Visuospatial/ Executive (0/5) 4  Naming (0/3) 3  Attention: Read list of digits (0/2) 2  Attention: Read list of letters (0/1) 1  Attention: Serial 7 subtraction starting at 100 (0/3) 3  Language: Repeat phrase (0/2) 2  Language : Fluency (0/1) 1  Abstraction (0/2) 2  Delayed Recall (0/5) 4  Orientation (0/6) 6  Total 28      10/11/2022   11:39 AM 07/02/2019    3:19 PM  6CIT Screen  What Year? 0 points 0 points  What month? 0 points 0 points  What time? 0 points 0 points  Count back from 20 0 points 0 points  Months in reverse 0 points 0 points  Repeat phrase 0 points 0 points  Total Score 0 points 0 points    Immunizations Immunization History  Administered Date(s) Administered   COVID-19, mRNA, vaccine(Comirnaty)12 years and older 04/19/2022   Influenza, High Dose Seasonal PF 02/02/2019, 04/09/2020, 02/08/2022   Influenza,inj,Quad PF,6+ Mos 04/16/2017, 02/01/2018   Influenza-Unspecified 03/05/2016   PFIZER Comirnaty(Gray Top)Covid-19 Tri-Sucrose Vaccine 10/16/2020   PFIZER(Purple Top)SARS-COV-2 Vaccination 07/19/2019, 08/11/2019, 03/08/2020, 04/19/2022   Pneumococcal-Unspecified 03/05/2016   Tdap 12/01/2013   Zoster Recombinat (Shingrix) 09/07/2017, 01/25/2018   Zoster, Live 06/05/2008    TDAP status: Up to date  Flu Vaccine status: Up to date  Pneumococcal vaccine status: Due,  Education has been provided regarding the importance of this vaccine. Advised may receive this vaccine at local pharmacy or Health Dept. Aware to provide a copy of the vaccination record if obtained from local pharmacy or Health Dept. Verbalized acceptance and understanding.  Covid-19 vaccine status: Information provided on how to obtain vaccines.   Qualifies for Shingles Vaccine? No   Zostavax completed Yes   Shingrix Completed?: Yes  Screening Tests Health Maintenance  Topic Date Due   COLONOSCOPY (Pts 45-57yrs Insurance coverage will need to be confirmed)  09/23/2022   INFLUENZA VACCINE  01/04/2023   Medicare Annual Wellness (AWV)  10/11/2023   DTaP/Tdap/Td (2 - Td or Tdap) 12/02/2023   DEXA SCAN  Completed   Hepatitis C Screening  Completed   Zoster Vaccines- Shingrix  Completed   HPV  VACCINES  Aged Out   Pneumonia Vaccine 89+ Years old  Discontinued   COVID-19 Vaccine  Discontinued    Health Maintenance  Health Maintenance Due  Topic Date Due   COLONOSCOPY (Pts 45-12yrs Insurance coverage will need to be confirmed)  09/23/2022    Colorectal cancer screening: Referral to GI placed  . Pt aware the office will call re: appt.  Mammogram no longer needed  Bone Density status: Completed 2019. Results reflect: Bone density results: NORMAL. Repeat every 5 years.  Lung Cancer Screening: (Low Dose CT Chest recommended if Age 46-80 years, 30 pack-year currently smoking OR have quit w/in 15years.) does not qualify.   Lung Cancer Screening Referral:   Additional Screening:  Hepatitis C Screening: does not qualify; Completed 2018  Vision Screening: Recommended annual ophthalmology exams for early detection of glaucoma and other disorders of the eye. Is the patient up to date with their annual eye exam?  Yes  Who is the provider or what is the name of the office in which the patient attends annual eye exams? [facility] If pt is not established with a provider, would they like to be  referred to a provider to establish care? No .   Dental Screening: Recommended annual dental exams for proper oral hygiene  Community Resource Referral / Chronic Care Management: CRR required this visit?  No   CCM required this visit?  No      Plan:     I have personally reviewed and noted the following in the patient's chart:   Medical and social history Use of alcohol, tobacco or illicit drugs  Current medications and supplements including opioid prescriptions. Patient is not currently taking opioid prescriptions. Functional ability and status Nutritional status Physical activity Advanced directives List of other physicians Hospitalizations, surgeries, and ER visits in previous 12 months Vitals Screenings to include cognitive, depression, and falls Referrals and appointments  In addition, I have reviewed and discussed with patient certain preventive protocols, quality metrics, and best practice recommendations. A written personalized care plan for preventive services as well as general preventive health recommendations were provided to patient.     Remi Haggard, LPN   [id number]/02/8118   Nurse Notes:

## 2022-10-11 NOTE — Patient Instructions (Signed)
Vanessa Christensen , Thank you for taking time to come for your Medicare Wellness Visit. I appreciate your ongoing commitment to your health goals. Please review the following plan we discussed and let me know if I can assist you in the future.   Screening recommendations/referrals: Colonoscopy: up to date Mammogram: no longer needed Bone Density: Education provided Recommended yearly ophthalmology/optometry visit for glaucoma screening and checkup Recommended yearly dental visit for hygiene and checkup  Vaccinations: Influenza vaccine: up to date Pneumococcal vaccine: Education provided Tdap vaccine: up to date Shingles vaccine: up to date    Advanced directives  yes       Preventive Care 74 Years and Older, Female Preventive care refers to lifestyle choices and visits with your health care provider that can promote health and wellness. What does preventive care include? A yearly physical exam. This is also called an annual well check. Dental exams once or twice a year. Routine eye exams. Ask your health care provider how often you should have your eyes checked. Personal lifestyle choices, including: Daily care of your teeth and gums. Regular physical activity. Eating a healthy diet. Avoiding tobacco and drug use. Limiting alcohol use. Practicing safe sex. Taking low-dose aspirin every day. Taking vitamin and mineral supplements as recommended by your health care provider. What happens during an annual well check? The services and screenings done by your health care provider during your annual well check will depend on your age, overall health, lifestyle risk factors, and family history of disease. Counseling  Your health care provider may ask you questions about your: Alcohol use. Tobacco use. Drug use. Emotional well-being. Home and relationship well-being. Sexual activity. Eating habits. History of falls. Memory and ability to understand (cognition). Work and work  Astronomer. Reproductive health. Screening  You may have the following tests or measurements: Height, weight, and BMI. Blood pressure. Lipid and cholesterol levels. These may be checked every 5 years, or more frequently if you are over 30 years old. Skin check. Lung cancer screening. You may have this screening every year starting at age 67 if you have a 30-pack-year history of smoking and currently smoke or have quit within the past 15 years. Fecal occult blood test (FOBT) of the stool. You may have this test every year starting at age 45. Flexible sigmoidoscopy or colonoscopy. You may have a sigmoidoscopy every 5 years or a colonoscopy every 10 years starting at age 74. Hepatitis C blood test. Hepatitis B blood test. Sexually transmitted disease (STD) testing. Diabetes screening. This is done by checking your blood sugar (glucose) after you have not eaten for a while (fasting). You may have this done every 1-3 years. Bone density scan. This is done to screen for osteoporosis. You may have this done starting at age 9. Mammogram. This may be done every 1-2 years. Talk to your health care provider about how often you should have regular mammograms. Talk with your health care provider about your test results, treatment options, and if necessary, the need for more tests. Vaccines  Your health care provider may recommend certain vaccines, such as: Influenza vaccine. This is recommended every year. Tetanus, diphtheria, and acellular pertussis (Tdap, Td) vaccine. You may need a Td booster every 10 years. Zoster vaccine. You may need this after age 77. Pneumococcal 13-valent conjugate (PCV13) vaccine. One dose is recommended after age 41. Pneumococcal polysaccharide (PPSV23) vaccine. One dose is recommended after age 53. Talk to your health care provider about which screenings and vaccines you need and  how often you need them. This information is not intended to replace advice given to you by  your health care provider. Make sure you discuss any questions you have with your health care provider. Document Released: 06/18/2015 Document Revised: 02/09/2016 Document Reviewed: 03/23/2015 Elsevier Interactive Patient Education  2017 [place] Prevention in the Home Falls can cause injuries. They can happen to people of all ages. There are many things you can do to make your home safe and to help prevent falls. What can I do on the outside of my home? Regularly fix the edges of walkways and driveways and fix any cracks. Remove anything that might make you trip as you walk through a door, such as a raised step or threshold. Trim any bushes or trees on the path to your home. Use bright outdoor lighting. Clear any walking paths of anything that might make someone trip, such as rocks or tools. Regularly check to see if handrails are loose or broken. Make sure that both sides of any steps have handrails. Any raised decks and porches should have guardrails on the edges. Have any leaves, snow, or ice cleared regularly. Use sand or salt on walking paths during winter. Clean up any spills in your garage right away. This includes oil or grease spills. What can I do in the bathroom? Use night lights. Install grab bars by the toilet and in the tub and shower. Do not use towel bars as grab bars. Use non-skid mats or decals in the tub or shower. If you need to sit down in the shower, use a plastic, non-slip stool. Keep the floor dry. Clean up any water that spills on the floor as soon as it happens. Remove soap buildup in the tub or shower regularly. Attach bath mats securely with double-sided non-slip rug tape. Do not have throw rugs and other things on the floor that can make you trip. What can I do in the bedroom? Use night lights. Make sure that you have a light by your bed that is easy to reach. Do not use any sheets or blankets that are too big for your bed. They should not hang  down onto the floor. Have a firm chair that has side arms. You can use this for support while you get dressed. Do not have throw rugs and other things on the floor that can make you trip. What can I do in the kitchen? Clean up any spills right away. Avoid walking on wet floors. Keep items that you use a lot in easy-to-reach places. If you need to reach something above you, use a strong step stool that has a grab bar. Keep electrical cords out of the way. Do not use floor polish or wax that makes floors slippery. If you must use wax, use non-skid floor wax. Do not have throw rugs and other things on the floor that can make you trip. What can I do with my stairs? Do not leave any items on the stairs. Make sure that there are handrails on both sides of the stairs and use them. Fix handrails that are broken or loose. Make sure that handrails are as long as the stairways. Check any carpeting to make sure that it is firmly attached to the stairs. Fix any carpet that is loose or worn. Avoid having throw rugs at the top or bottom of the stairs. If you do have throw rugs, attach them to the floor with carpet tape. Make sure that you have  a light switch at the top of the stairs and the bottom of the stairs. If you do not have them, ask someone to add them for you. What else can I do to help prevent falls? Wear shoes that: Do not have high heels. Have rubber bottoms. Are comfortable and fit you well. Are closed at the toe. Do not wear sandals. If you use a stepladder: Make sure that it is fully opened. Do not climb a closed stepladder. Make sure that both sides of the stepladder are locked into place. Ask someone to hold it for you, if possible. Clearly mark and make sure that you can see: Any grab bars or handrails. First and last steps. Where the edge of each step is. Use tools that help you move around (mobility aids) if they are needed. These  include: Canes. Walkers. Scooters. Crutches. Turn on the lights when you go into a dark area. Replace any light bulbs as soon as they burn out. Set up your furniture so you have a clear path. Avoid moving your furniture around. If any of your floors are uneven, fix them. If there are any pets around you, be aware of where they are. Review your medicines with your doctor. Some medicines can make you feel dizzy. This can increase your chance of falling. Ask your doctor what other things that you can do to help prevent falls. This information is not intended to replace advice given to you by your health care provider. Make sure you discuss any questions you have with your health care provider. Document Released: 03/18/2009 Document Revised: 10/28/2015 Document Reviewed: 06/26/2014 Elsevier Interactive Patient Education  2017 [employer].

## 2022-10-19 ENCOUNTER — Encounter (HOSPITAL_COMMUNITY): Payer: Self-pay

## 2022-10-19 NOTE — Patient Instructions (Addendum)
SURGICAL WAITING ROOM VISITATION  Patients having surgery or a procedure may have no more than 2 support people in the waiting area - these visitors may rotate.    Children under the age of 69 must have an adult with them who is not the patient.  If the patient needs to stay at the hospital during part of their recovery, the visitor guidelines for inpatient rooms apply.  Pre-op nurse will coordinate an appropriate time for 1 support person to accompany patient in pre-op.  This support person may not rotate.    Please refer to the [facility] website for the visitor guidelines for Inpatients (after your surgery is over and you are in a regular room).       Your procedure is scheduled on: 10-27-22   Report to [facility] Main Entrance    Report to admitting at       0900  AM   Call this number if you have problems the morning of surgery [phone]   Do not eat food :After Midnight.   After Midnight you may have the following liquids until _0830_____ AM DAY OF SURGERY  then nothing by mouth  Water Non-Citrus Juices (without pulp, NO RED-Apple, White grape, White cranberry) Black Coffee (NO MILK/CREAM OR CREAMERS, sugar ok)  Clear Tea (NO MILK/CREAM OR CREAMERS, sugar ok) regular and decaf                             Plain Jell-O (NO RED)                                           Fruit ices (not with fruit pulp, NO RED)                                     Popsicles (NO RED)                                                               Sports drinks like Gatorade (NO RED)                     The day of surgery:  Drink ONE (1) Pre-Surgery Clear Ensure or G2 at      0820 AM the morning of surgery. Drink in one sitting. Do not sip.  This drink was given to you during your hospital  pre-op appointment visit. Nothing else to drink after completing the  Pre-Surgery Clear Ensure or G2  by 0830 am .          If you have questions, please contact your surgeon's  office.   FOLLOW ANY ADDITIONAL PRE OP INSTRUCTIONS YOU RECEIVED FROM YOUR SURGEON'S OFFICE!!!     Oral Hygiene is also important to reduce your risk of infection.                                    Remember - BRUSH YOUR TEETH THE MORNING OF SURGERY WITH YOUR  REGULAR TOOTHPASTE  DENTURES WILL BE REMOVED PRIOR TO SURGERY PLEASE DO NOT APPLY "Poly grip" OR ADHESIVES!!!   Do NOT smoke after Midnight   Take these medicines the morning of surgery with A SIP OF WATER: levothyroxine, fluoxentine, eyedrops, cetrizine(zyrtec), bupropion                               You may not have any metal on your body including hair pins, jewelry, and body piercing             Do not wear make-up, lotions, powders, perfumes/cologne, or deodorant  Do not wear nail polish including gel and S&S, artificial/acrylic nails, or any other type of covering on natural nails including finger and toenails. If you have artificial nails, gel coating, etc. that needs to be removed by a nail salon please have this removed prior to surgery or surgery may need to be canceled/ delayed if the surgeon/ anesthesia feels like they are unable to be safely monitored.   Do not shave  5 days  prior to surgery.             Do not bring valuables to the hospital.  IS NOT             RESPONSIBLE   FOR VALUABLES.   Contacts, glasses, dentures or bridgework may not be worn into surgery.   Bring small overnight bag day of surgery.   DO NOT BRING YOUR HOME MEDICATIONS TO THE HOSPITAL. PHARMACY WILL DISPENSE MEDICATIONS LISTED ON YOUR MEDICATION LIST TO YOU DURING YOUR ADMISSION IN THE HOSPITAL!    Patients discharged on the day of surgery will not be allowed to drive home.  Someone NEEDS to stay with you for the first 24 hours after anesthesia.   Special Instructions:  If you have one Bring a copy of your healthcare power of attorney and living will documents the day of surgery if you haven't scanned them before.               Please read over the following fact sheets you were given: IF YOU HAVE QUESTIONS ABOUT YOUR PRE-OP INSTRUCTIONS PLEASE CALL [phone]    If you test positive for Covid or have been in contact with anyone that has tested positive in the last 10 days please notify you surgeon.      Pre-operative 5 CHG Bath Instructions   You can play a key role in reducing the risk of infection after surgery. Your skin needs to be as free of germs as possible. You can reduce the number of germs on your skin by washing with CHG (chlorhexidine gluconate) soap before surgery. CHG is an antiseptic soap that kills germs and continues to kill germs even after washing.   DO NOT use if you have an allergy to chlorhexidine/CHG or antibacterial soaps. If your skin becomes reddened or irritated, stop using the CHG and notify one of our RNs at [phone].   Please shower with the CHG soap starting 4 days before surgery using the following schedule:     Please keep in mind the following:  DO NOT shave, including legs and underarms, starting the day of your first shower.   You may shave your face at any point before/day of surgery.  Place clean sheets on your bed the day you start using CHG soap. Use a clean washcloth (not used since being washed) for each shower. DO NOT sleep  with pets once you start using the CHG.   CHG Shower Instructions:  If you choose to wash your hair and private area, wash first with your normal shampoo/soap.  After you use shampoo/soap, rinse your hair and body thoroughly to remove shampoo/soap residue.  Turn the water OFF and apply about 3 tablespoons (45 ml) of CHG soap to a CLEAN washcloth.  Apply CHG soap ONLY FROM YOUR NECK DOWN TO YOUR TOES (washing for 3-5 minutes)  DO NOT use CHG soap on face, private areas, open wounds, or sores.  Pay special attention to the area where your surgery is being performed.  If you are having back surgery, having someone wash your back for you  may be helpful. Wait 2 minutes after CHG soap is applied, then you may rinse off the CHG soap.  Pat dry with a clean towel  Put on clean clothes/pajamas   If you choose to wear lotion, please use ONLY the CHG-compatible lotions on the back of this paper.     Additional instructions for the day of surgery: DO NOT APPLY any lotions, deodorants, cologne, or perfumes.   Put on clean/comfortable clothes.  Brush your teeth.  Ask your nurse before applying any prescription medications to the skin.      CHG Compatible Lotions   Aveeno Moisturizing lotion  Cetaphil Moisturizing Cream  Cetaphil Moisturizing Lotion  Clairol Herbal Essence Moisturizing Lotion, Dry Skin  Clairol Herbal Essence Moisturizing Lotion, Extra Dry Skin  Clairol Herbal Essence Moisturizing Lotion, Normal Skin  Curel Age Defying Therapeutic Moisturizing Lotion with Alpha Hydroxy  Curel Extreme Care Body Lotion  Curel Soothing Hands Moisturizing Hand Lotion  Curel Therapeutic Moisturizing Cream, Fragrance-Free  Curel Therapeutic Moisturizing Lotion, Fragrance-Free  Curel Therapeutic Moisturizing Lotion, Original Formula  Eucerin Daily Replenishing Lotion  Eucerin Dry Skin Therapy Plus Alpha Hydroxy Crme  Eucerin Dry Skin Therapy Plus Alpha Hydroxy Lotion  Eucerin Original Crme  Eucerin Original Lotion  Eucerin Plus Crme Eucerin Plus Lotion  Eucerin TriLipid Replenishing Lotion  Keri Anti-Bacterial Hand Lotion  Keri Deep Conditioning Original Lotion Dry Skin Formula Softly Scented  Keri Deep Conditioning Original Lotion, Fragrance Free Sensitive Skin Formula  Keri Lotion Fast Absorbing Fragrance Free Sensitive Skin Formula  Keri Lotion Fast Absorbing Softly Scented Dry Skin Formula  Keri Original Lotion  Keri Skin Renewal Lotion Keri Silky Smooth Lotion  Keri Silky Smooth Sensitive Skin Lotion  Nivea Body Creamy Conditioning Oil  Nivea Body Extra Enriched Lotion  Nivea Body Original Lotion  Nivea Body  Sheer Moisturizing Lotion Nivea Crme  Nivea Skin Firming Lotion  NutraDerm 30 Skin Lotion  NutraDerm Skin Lotion  NutraDerm Therapeutic Skin Cream  NutraDerm Therapeutic Skin Lotion  ProShield Protective Hand Cream  Provon moisturizing lotion      Incentive Spirometer  An incentive spirometer is a tool that can help keep your lungs clear and active. This tool measures how well you are filling your lungs with each breath. Taking long deep breaths may help reverse or decrease the chance of developing breathing (pulmonary) problems (especially infection) following: A long period of time when you are unable to move or be active. BEFORE THE PROCEDURE  If the spirometer includes an indicator to show your best effort, your nurse or respiratory therapist will set it to a desired goal. If possible, sit up straight or lean slightly forward. Try not to slouch. Hold the incentive spirometer in an upright position. INSTRUCTIONS FOR USE  Sit on the edge of your bed  if possible, or sit up as far as you can in bed or on a chair. Hold the incentive spirometer in an upright position. Breathe out normally. Place the mouthpiece in your mouth and seal your lips tightly around it. Breathe in slowly and as deeply as possible, raising the piston or the ball toward the top of the column. Hold your breath for 3-5 seconds or for as long as possible. Allow the piston or ball to fall to the bottom of the column. Remove the mouthpiece from your mouth and breathe out normally. Rest for a few seconds and repeat Steps 1 through 7 at least 10 times every 1-2 hours when you are awake. Take your time and take a few normal breaths between deep breaths. The spirometer may include an indicator to show your best effort. Use the indicator as a goal to work toward during each repetition. After each set of 10 deep breaths, practice coughing to be sure your lungs are clear. If you have an incision (the cut made at the time of  surgery), support your incision when coughing by placing a pillow or rolled up towels firmly against it. Once you are able to get out of bed, walk around indoors and cough well. You may stop using the incentive spirometer when instructed by your caregiver.  RISKS AND COMPLICATIONS Take your time so you do not get dizzy or light-headed. If you are in pain, you may need to take or ask for pain medication before doing incentive spirometry. It is harder to take a deep breath if you are having pain. AFTER USE Rest and breathe slowly and easily. It can be helpful to keep track of a log of your progress. Your caregiver can provide you with a simple table to help with this. If you are using the spirometer at home, follow these instructions: SEEK MEDICAL CARE IF:  You are having difficultly using the spirometer. You have trouble using the spirometer as often as instructed. Your pain medication is not giving enough relief while using the spirometer. You develop fever of 100.5 F (38.1 C) or higher. SEEK IMMEDIATE MEDICAL CARE IF:  You cough up bloody sputum that had not been present before. You develop fever of 102 F (38.9 C) or greater. You develop worsening pain at or near the incision site. MAKE SURE YOU:  Understand these instructions. Will watch your condition. Will get help right away if you are not doing well or get worse. Document Released: 10/02/2006 Document Revised: 08/14/2011 Document Reviewed: 12/03/2006 [facility] Patient Information 2014 [place], [place].   ________________________________________________________________________

## 2022-10-19 NOTE — Progress Notes (Addendum)
PCP - Dr. Nadyne Coombes    LOV 07-10-22 Cardiologist -   PPM/ICD -  Device Orders -  Rep Notified -   Chest x-ray -  EKG -  Stress Test -  ECHO -  Cardiac Cath -   Sleep Study -  CPAP -   Fasting Blood Sugar -  Checks Blood Sugar _____ times a day  Blood Thinner Instructions: Aspirin Instructions:  81mg   asa  ERAS Protcol - PRE-SURGERY Ensure    COVID vaccine - x 5   Activity--Able to climb a flight of stairs without SOB or CP Anesthesia review: HTN,  OSA no cpap  Patient denies shortness of breath, fever, cough and chest pain at PAT appointment   All instructions explained to the patient, with a verbal understanding of the material. Patient agrees to go over the instructions while at home for a better understanding. Patient also instructed to self quarantine after being tested for COVID-19. The opportunity to ask questions was provided.

## 2022-10-23 ENCOUNTER — Encounter (HOSPITAL_COMMUNITY): Payer: Self-pay

## 2022-10-23 ENCOUNTER — Encounter (HOSPITAL_COMMUNITY)
Admission: RE | Admit: 2022-10-23 | Discharge: 2022-10-23 | Disposition: A | Discharge status: Home / Self Care | Payer: Medicare Other | Source: Ambulatory Visit | Attending: Orthopaedic Surgery | Admitting: Orthopaedic Surgery

## 2022-10-23 ENCOUNTER — Other Ambulatory Visit: Payer: Self-pay

## 2022-10-23 VITALS — BP 155/78 | HR 69 | Temp 98.5°F | Resp 16 | Ht 62.0 in | Wt 207.0 lb

## 2022-10-23 DIAGNOSIS — I10 Essential (primary) hypertension: Secondary | ICD-10-CM | POA: Diagnosis not present

## 2022-10-23 DIAGNOSIS — Z01818 Encounter for other preprocedural examination: Secondary | ICD-10-CM | POA: Insufficient documentation

## 2022-10-23 DIAGNOSIS — M1712 Unilateral primary osteoarthritis, left knee: Secondary | ICD-10-CM | POA: Insufficient documentation

## 2022-10-23 HISTORY — DX: Gastro-esophageal reflux disease without esophagitis: K21.9

## 2022-10-23 HISTORY — DX: Essential (primary) hypertension: I10

## 2022-10-23 LAB — COMPREHENSIVE METABOLIC PANEL
ALT: 28 U/L (ref 0–44)
AST: 25 U/L (ref 15–41)
Albumin: 4 g/dL (ref 3.5–5.0)
Alkaline Phosphatase: 63 U/L (ref 38–126)
Anion gap: 8 (ref 5–15)
BUN: 19 mg/dL (ref 8–23)
CO2: 24 mmol/L (ref 22–32)
Calcium: 9 mg/dL (ref 8.9–10.3)
Chloride: 108 mmol/L (ref 98–111)
Creatinine, Ser: 0.74 mg/dL (ref 0.44–1.00)
GFR, Estimated: >60 mL/min (ref >60)
Glucose, Bld: 87 mg/dL (ref 70–99)
Potassium: 4.5 mmol/L (ref 3.5–5.1)
Sodium: 140 mmol/L (ref 135–145)
Total Bilirubin: 0.7 mg/dL (ref 0.3–1.2)
Total Protein: 7 g/dL (ref 6.5–8.1)

## 2022-10-23 LAB — CBC
HCT: 37.9 % (ref 36.0–46.0)
Hemoglobin: 11.9 g/dL — ABNORMAL LOW (ref 12.0–15.0)
MCH: 28.7 pg (ref 26.0–34.0)
MCHC: 31.4 g/dL (ref 30.0–36.0)
MCV: 91.3 fL (ref 80.0–100.0)
Platelets: 157 10*3/uL (ref 150–400)
RBC: 4.15 MIL/uL (ref 3.87–5.11)
RDW: 13.9 % (ref 11.5–15.5)
WBC: 3 10*3/uL — ABNORMAL LOW (ref 4.0–10.5)
nRBC: 0 % (ref 0.0–0.2)

## 2022-10-23 LAB — SURGICAL PCR SCREEN
MRSA, PCR: NEGATIVE
Staphylococcus aureus: NEGATIVE

## 2022-10-26 ENCOUNTER — Telehealth: Payer: Self-pay | Admitting: *Deleted

## 2022-10-26 NOTE — Care Plan (Signed)
[facility] RNCM call to patient to review her upcoming Left total knee arthroplasty with Dr. Magnus Ivan on 10/27/22. She is an Ortho bundle patient through [facility] and is agreeable to case management. She lives with her spouse, who will be assisting her at discharge. She has a standard walker and a 3in1/BSC. Anticipate HHPT will be needed after short hospital stay. Referral made to [facility] after choice provided. Reviewed all post op care instructions. Will continue to follow for needs.

## 2022-10-26 NOTE — Telephone Encounter (Signed)
Ortho bundle pre-op call completed. 

## 2022-10-26 NOTE — H&P (Signed)
TOTAL KNEE ADMISSION H&P  Patient is being admitted for left total knee arthroplasty.  Subjective:  Chief Complaint:left knee pain.  HPI: Vanessa Christensen, 72 y.o. female, has a history of pain and functional disability in the left knee due to arthritis and has failed non-surgical conservative treatments for greater than 12 weeks to includeNSAID's and/or analgesics, corticosteriod injections, flexibility and strengthening excercises, use of assistive devices, weight reduction as appropriate, and activity modification.  Onset of symptoms was gradual, starting 3 years ago with gradually worsening course since that time. The patient noted no past surgery on the left knee(s).  Patient currently rates pain in the left knee(s) at 10 out of 10 with activity. Patient has night pain, worsening of pain with activity and weight bearing, pain that interferes with activities of daily living, pain with passive range of motion, crepitus, and joint swelling.  Patient has evidence of subchondral sclerosis, periarticular osteophytes, and joint space narrowing by imaging studies. There is no active infection.  Patient Active Problem List   Diagnosis Date Noted   Generalized obesity 10/05/2022   Memory loss 06/12/2022   Essential hypertension 04/13/2022   Morbid obesity (HCC) 04/13/2022   Stress incontinence 11/08/2021   Unilateral primary osteoarthritis, left knee 12/27/2020   Status post revision of total replacement of right knee 05/07/2020   Failed total knee, right, 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/2020   History of total right knee replacement 01/28/2020   Vitamin D deficiency 08/29/2018   Insulin resistance 08/29/2018   Other specified glaucoma 08/14/2018   Incontinence of urine in female 11/07/2017   Vertigo 11/07/2017   Left knee pain 10/25/2016   Genetic testing 08/08/2016   Hypothyroidism 12/02/2015   Anxiety and depression 12/02/2015   OAB (overactive bladder) 12/02/2015   Breast cancer of upper-outer  quadrant of left female breast (HCC) 12/02/2015   BMI 37.0-37.9, adult 12/02/2015   Past Medical History:  Diagnosis Date   Anemia    hx of  iron deficient   Anxiety    pt denies   Arthritis    hands and feet   Cancer (HCC) 10/04/2006   breast    left   Cataracts, both eyes    Depression    Depression    Diverticulosis of colon    GERD (gastroesophageal reflux disease)    Glaucoma    Hypertension    no meds   Joint pain    Knee pain    Obesity    OSA on CPAP    Osteoarthritis    Sleep apnea    no CPAP- no longer needed d/t weight loss   Thyroid activity decreased     Past Surgical History:  Procedure Laterality Date   BREAST SURGERY Bilateral    CATARACT EXTRACTION     COLONOSCOPY     GLAUCOMA REPAIR     MASTECTOMY, RADICAL Bilateral    neulasta induced sterile abscesses     THYROIDECTOMY, PARTIAL     TONSILLECTOMY     TOTAL KNEE ARTHROPLASTY Right    TOTAL KNEE REVISION Right 05/07/2020   Procedure: RIGHT TOTAL KNEE REVISION ARTHROPLASTY;  Surgeon: [name], MD;  Location: [facility];  Service: Orthopedics;  Laterality: Right;    No current facility-administered medications for this encounter.   Current Outpatient Medications  Medication Sig Dispense Refill Last Dose   Ascorbic Acid (VITAMIN C PO) Take 1 tablet by mouth daily.      aspirin EC 81 MG tablet Take 81 mg by mouth daily. Swallow whole.  B Complex-C (SUPER B COMPLEX PO) Take 1 tablet by mouth daily after supper.      buPROPion (WELLBUTRIN XL) 150 MG 24 hr tablet Take 2 tablets (300 mg total) by mouth daily. 180 tablet 1    cetirizine (ZYRTEC) 10 MG tablet Take 10 mg by mouth daily.      Cholecalciferol (VITAMIN D) 50 MCG (2000 UT) tablet Take 2,000 Units by mouth daily.      diphenhydramine-acetaminophen (TYLENOL PM) 25-500 MG TABS tablet Take 2 tablets by mouth at bedtime as needed (sleep).      dorzolamide-timolol (COSOPT) 2-0.5 % ophthalmic solution Place 1 drop into both eyes 2 (two)  times daily.      FLUoxetine (PROZAC) 40 MG capsule TAKE 1 CAPSULE (40 MG TOTAL) BY MOUTH DAILY. 90 capsule 3    ibuprofen (ADVIL) 200 MG tablet Take 400-800 mg by mouth every 6 (six) hours as needed for moderate pain.      levothyroxine (SYNTHROID) 75 MCG tablet TAKE 1 TABLET BY MOUTH EVERY DAY 90 tablet 1    psyllium (METAMUCIL) 58.6 % powder Take 1 packet by mouth every other day.      traZODone (DESYREL) 50 MG tablet TAKE 1/2 TO 1 TABLET BY MOUTH AT BEDTIME AS NEEDED FOR SLEEP (Patient taking differently: Take 50 mg by mouth at bedtime.) 90 tablet 2    Allergies  Allergen Reactions   Neulasta [Pegfilgrastim] Other (See Comments)    Raised lumps on legs, arms - had to be excised =  Happened every time pt received Neulasta.    Social History   Tobacco Use   Smoking status: Former    Types: Cigarettes    Quit date: 11/02/1977    Years since quitting: 45.0   Smokeless tobacco: Never  Substance Use Topics   Alcohol use: No    Family History  Problem Relation Age of Onset   CVA Mother    Cancer Mother 38       breast cancer    Heart disease Mother    Stroke Mother    Obesity Mother    Leukemia Father    High blood pressure Father    High Cholesterol Father    Heart disease Father    Cancer Maternal Aunt 26       breast cancer    Cancer Maternal Aunt 55       breast cancer   Colon cancer Maternal Aunt    Cancer Cousin 32       breast cancer   Esophageal cancer Neg Hx    Rectal cancer Neg Hx    Stomach cancer Neg Hx      Review of Systems  Objective:  Physical Exam Vitals reviewed.  Constitutional:      Appearance: Normal appearance. She is obese.  HENT:     Head: Normocephalic and atraumatic.  Eyes:     Extraocular Movements: Extraocular movements intact.     Pupils: Pupils are equal, round, and reactive to light.  Cardiovascular:     Rate and Rhythm: Normal rate and regular rhythm.     Pulses: Normal pulses.  Pulmonary:     Effort: Pulmonary effort is  normal.     Breath sounds: Normal breath sounds.  Abdominal:     Palpations: Abdomen is soft.  Musculoskeletal:     Cervical back: Normal range of motion and neck supple.     Left knee: Effusion, bony tenderness and crepitus present. Decreased range of motion. Tenderness present over  the medial joint line and lateral joint line. Abnormal alignment.  Neurological:     Mental Status: She is alert and oriented to person, place, and time.  Psychiatric:        Behavior: Behavior normal.     Vital signs in last 24 hours:    Labs:   Estimated body mass index is 37.86 kg/m as calculated from the following:   Height as of 10/23/22: 5\' 2"  (1.575 m).   Weight as of 10/23/22: 93.9 kg.   Imaging Review Plain radiographs demonstrate severe degenerative joint disease of the left knee(s). The overall alignment ismild varus. The bone quality appears to be good for age and reported activity level.      Assessment/Plan:  End stage arthritis, left knee   The patient history, physical examination, clinical judgment of the provider and imaging studies are consistent with end stage degenerative joint disease of the left knee(s) and total knee arthroplasty is deemed medically necessary. The treatment options including medical management, injection therapy arthroscopy and arthroplasty were discussed at length. The risks and benefits of total knee arthroplasty were presented and reviewed. The risks due to aseptic loosening, infection, stiffness, patella tracking problems, thromboembolic complications and other imponderables were discussed. The patient acknowledged the explanation, agreed to proceed with the plan and consent was signed. Patient is being admitted for inpatient treatment for surgery, pain control, PT, OT, prophylactic antibiotics, VTE prophylaxis, progressive ambulation and ADL's and discharge planning. The patient is planning to be discharged home with home health services

## 2022-10-26 NOTE — Progress Notes (Signed)
Pt aware of 0830 arrival and stopping clear liquids by 0800.

## 2022-10-27 ENCOUNTER — Inpatient Hospital Stay (HOSPITAL_COMMUNITY)
Admission: RE | Admit: 2022-10-27 | Discharge: 2022-10-29 | DRG: 470 | Disposition: A | Discharge status: Home Health | Payer: Medicare Other | Attending: Orthopaedic Surgery | Admitting: Orthopaedic Surgery

## 2022-10-27 ENCOUNTER — Encounter (HOSPITAL_COMMUNITY)
Admission: RE | Disposition: A | Discharge status: Home Health | Payer: Self-pay | Source: Home / Self Care | Attending: Orthopaedic Surgery

## 2022-10-27 ENCOUNTER — Ambulatory Visit (HOSPITAL_COMMUNITY): Payer: Medicare Other | Admitting: Certified Registered Nurse Anesthetist

## 2022-10-27 ENCOUNTER — Observation Stay (HOSPITAL_COMMUNITY): Payer: Medicare Other

## 2022-10-27 ENCOUNTER — Other Ambulatory Visit: Payer: Self-pay

## 2022-10-27 ENCOUNTER — Encounter (HOSPITAL_COMMUNITY): Payer: Self-pay | Admitting: Orthopaedic Surgery

## 2022-10-27 DIAGNOSIS — M25762 Osteophyte, left knee: Secondary | ICD-10-CM | POA: Diagnosis present

## 2022-10-27 DIAGNOSIS — Z9013 Acquired absence of bilateral breasts and nipples: Secondary | ICD-10-CM | POA: Diagnosis not present

## 2022-10-27 DIAGNOSIS — I10 Essential (primary) hypertension: Secondary | ICD-10-CM

## 2022-10-27 DIAGNOSIS — E039 Hypothyroidism, unspecified: Secondary | ICD-10-CM

## 2022-10-27 DIAGNOSIS — Z87891 Personal history of nicotine dependence: Secondary | ICD-10-CM

## 2022-10-27 DIAGNOSIS — F32A Depression, unspecified: Secondary | ICD-10-CM | POA: Diagnosis present

## 2022-10-27 DIAGNOSIS — Z806 Family history of leukemia: Secondary | ICD-10-CM

## 2022-10-27 DIAGNOSIS — Z800 Family history of malignant neoplasm of digestive organs: Secondary | ICD-10-CM | POA: Diagnosis not present

## 2022-10-27 DIAGNOSIS — N3281 Overactive bladder: Secondary | ICD-10-CM | POA: Diagnosis present

## 2022-10-27 DIAGNOSIS — M1712 Unilateral primary osteoarthritis, left knee: Principal | ICD-10-CM | POA: Diagnosis present

## 2022-10-27 DIAGNOSIS — Z79890 Hormone replacement therapy (postmenopausal): Secondary | ICD-10-CM | POA: Diagnosis not present

## 2022-10-27 DIAGNOSIS — Z79899 Other long term (current) drug therapy: Secondary | ICD-10-CM | POA: Diagnosis not present

## 2022-10-27 DIAGNOSIS — Z888 Allergy status to other drugs, medicaments and biological substances status: Secondary | ICD-10-CM

## 2022-10-27 DIAGNOSIS — F418 Other specified anxiety disorders: Secondary | ICD-10-CM | POA: Diagnosis not present

## 2022-10-27 DIAGNOSIS — Z853 Personal history of malignant neoplasm of breast: Secondary | ICD-10-CM

## 2022-10-27 DIAGNOSIS — Z823 Family history of stroke: Secondary | ICD-10-CM | POA: Diagnosis not present

## 2022-10-27 DIAGNOSIS — Z83438 Family history of other disorder of lipoprotein metabolism and other lipidemia: Secondary | ICD-10-CM | POA: Diagnosis not present

## 2022-10-27 DIAGNOSIS — Z96651 Presence of right artificial knee joint: Secondary | ICD-10-CM | POA: Diagnosis present

## 2022-10-27 DIAGNOSIS — G8918 Other acute postprocedural pain: Secondary | ICD-10-CM | POA: Diagnosis not present

## 2022-10-27 DIAGNOSIS — F419 Anxiety disorder, unspecified: Secondary | ICD-10-CM | POA: Diagnosis present

## 2022-10-27 DIAGNOSIS — Z96652 Presence of left artificial knee joint: Secondary | ICD-10-CM | POA: Diagnosis not present

## 2022-10-27 DIAGNOSIS — Z7982 Long term (current) use of aspirin: Secondary | ICD-10-CM

## 2022-10-27 DIAGNOSIS — M25562 Pain in left knee: Secondary | ICD-10-CM | POA: Diagnosis present

## 2022-10-27 DIAGNOSIS — Z803 Family history of malignant neoplasm of breast: Secondary | ICD-10-CM

## 2022-10-27 DIAGNOSIS — G4730 Sleep apnea, unspecified: Secondary | ICD-10-CM | POA: Diagnosis not present

## 2022-10-27 DIAGNOSIS — K219 Gastro-esophageal reflux disease without esophagitis: Secondary | ICD-10-CM | POA: Diagnosis present

## 2022-10-27 DIAGNOSIS — H409 Unspecified glaucoma: Secondary | ICD-10-CM | POA: Diagnosis present

## 2022-10-27 DIAGNOSIS — Z471 Aftercare following joint replacement surgery: Secondary | ICD-10-CM | POA: Diagnosis not present

## 2022-10-27 DIAGNOSIS — Z8249 Family history of ischemic heart disease and other diseases of the circulatory system: Secondary | ICD-10-CM

## 2022-10-27 HISTORY — PX: TOTAL KNEE ARTHROPLASTY: SHX125

## 2022-10-27 LAB — CBC
HCT: 35 % — ABNORMAL LOW (ref 36.0–46.0)
Hemoglobin: 10.9 g/dL — ABNORMAL LOW (ref 12.0–15.0)
MCH: 29.3 pg (ref 26.0–34.0)
MCHC: 31.1 g/dL (ref 30.0–36.0)
MCV: 94.1 fL (ref 80.0–100.0)
Platelets: 144 10*3/uL — ABNORMAL LOW (ref 150–400)
RBC: 3.72 MIL/uL — ABNORMAL LOW (ref 3.87–5.11)
RDW: 13.8 % (ref 11.5–15.5)
WBC: 3.7 10*3/uL — ABNORMAL LOW (ref 4.0–10.5)
nRBC: 0 % (ref 0.0–0.2)

## 2022-10-27 LAB — BASIC METABOLIC PANEL
Anion gap: 6 (ref 5–15)
BUN: 19 mg/dL (ref 8–23)
CO2: 24 mmol/L (ref 22–32)
Calcium: 8.2 mg/dL — ABNORMAL LOW (ref 8.9–10.3)
Chloride: 109 mmol/L (ref 98–111)
Creatinine, Ser: 0.76 mg/dL (ref 0.44–1.00)
GFR, Estimated: >60 mL/min (ref >60)
Glucose, Bld: 97 mg/dL (ref 70–99)
Potassium: 4.1 mmol/L (ref 3.5–5.1)
Sodium: 139 mmol/L (ref 135–145)

## 2022-10-27 SURGERY — ARTHROPLASTY, KNEE, TOTAL
Anesthesia: Spinal | Site: Knee | Laterality: Left

## 2022-10-27 MED ORDER — METHOCARBAMOL 500 MG PO TABS
500.0000 mg | ORAL_TABLET | Freq: Four times a day (QID) | ORAL | Status: DC | PRN
  Administered 2022-10-28 – 2022-10-29 (×3): 500 mg via ORAL
  Filled 2022-10-27 (×4): qty 1

## 2022-10-27 MED ORDER — DEXAMETHASONE SODIUM PHOSPHATE 10 MG/ML IJ SOLN
INTRAMUSCULAR | Status: AC
  Filled 2022-10-27: qty 1

## 2022-10-27 MED ORDER — LIDOCAINE HCL (PF) 2 % IJ SOLN
INTRAMUSCULAR | Status: AC
  Filled 2022-10-27: qty 5

## 2022-10-27 MED ORDER — MIDAZOLAM HCL 2 MG/2ML IJ SOLN: 0.5000 mg | Freq: Once | INTRAMUSCULAR | Status: DC | PRN

## 2022-10-27 MED ORDER — PHENOL 1.4 % MT LIQD: 1.0000 | OROMUCOSAL | Status: DC | PRN

## 2022-10-27 MED ORDER — ACETAMINOPHEN 500 MG PO TABS
1000.0000 mg | ORAL_TABLET | Freq: Once | ORAL | Status: AC
  Administered 2022-10-27: 1000 mg via ORAL
  Filled 2022-10-27: qty 2

## 2022-10-27 MED ORDER — BUPIVACAINE IN DEXTROSE 0.75-8.25 % IT SOLN
INTRATHECAL | Status: DC | PRN
  Administered 2022-10-27: 12 mg via INTRATHECAL

## 2022-10-27 MED ORDER — CEFAZOLIN SODIUM-DEXTROSE 1-4 GM/50ML-% IV SOLN
1.0000 g | Freq: Four times a day (QID) | INTRAVENOUS | Status: AC
  Administered 2022-10-27 (×2): 1 g via INTRAVENOUS
  Filled 2022-10-27 (×2): qty 50

## 2022-10-27 MED ORDER — OXYCODONE HCL 5 MG PO TABS
10.0000 mg | ORAL_TABLET | ORAL | Status: DC | PRN
  Administered 2022-10-27: 15 mg via ORAL
  Administered 2022-10-27: 10 mg via ORAL
  Administered 2022-10-27: 15 mg via ORAL
  Filled 2022-10-27: qty 2
  Filled 2022-10-27 (×2): qty 3

## 2022-10-27 MED ORDER — KETOROLAC TROMETHAMINE 15 MG/ML IJ SOLN
7.5000 mg | Freq: Four times a day (QID) | INTRAMUSCULAR | Status: AC
  Administered 2022-10-27 – 2022-10-28 (×3): 7.5 mg via INTRAVENOUS
  Filled 2022-10-27 (×3): qty 1

## 2022-10-27 MED ORDER — OXYCODONE HCL 5 MG PO TABS
5.0000 mg | ORAL_TABLET | ORAL | Status: DC | PRN
  Administered 2022-10-28: 10 mg via ORAL
  Administered 2022-10-28 – 2022-10-29 (×3): 5 mg via ORAL
  Filled 2022-10-27: qty 2
  Filled 2022-10-27 (×3): qty 1

## 2022-10-27 MED ORDER — MIDAZOLAM HCL 2 MG/2ML IJ SOLN
1.0000 mg | INTRAMUSCULAR | Status: DC
  Administered 2022-10-27: 1 mg via INTRAVENOUS
  Filled 2022-10-27: qty 2

## 2022-10-27 MED ORDER — OXYCODONE HCL 5 MG PO TABS: 5.0000 mg | ORAL_TABLET | Freq: Once | ORAL | Status: DC | PRN

## 2022-10-27 MED ORDER — CEFAZOLIN SODIUM-DEXTROSE 2-4 GM/100ML-% IV SOLN
2.0000 g | INTRAVENOUS | Status: AC
  Administered 2022-10-27: 2 g via INTRAVENOUS
  Filled 2022-10-27: qty 100

## 2022-10-27 MED ORDER — SODIUM CHLORIDE 0.9 % IV SOLN: INTRAVENOUS | Status: DC

## 2022-10-27 MED ORDER — PROPOFOL 500 MG/50ML IV EMUL
INTRAVENOUS | Status: DC | PRN
  Administered 2022-10-27: 65 ug/kg/min via INTRAVENOUS

## 2022-10-27 MED ORDER — METHOCARBAMOL 500 MG IVPB - SIMPLE MED
500.0000 mg | Freq: Four times a day (QID) | INTRAVENOUS | Status: DC | PRN
  Administered 2022-10-27 (×2): 500 mg via INTRAVENOUS
  Filled 2022-10-27: qty 55
  Filled 2022-10-27: qty 500

## 2022-10-27 MED ORDER — HYDROMORPHONE HCL 1 MG/ML IJ SOLN
0.5000 mg | INTRAMUSCULAR | Status: DC | PRN
  Administered 2022-10-27 – 2022-10-28 (×4): 1 mg via INTRAVENOUS
  Filled 2022-10-27 (×5): qty 1

## 2022-10-27 MED ORDER — ONDANSETRON HCL 4 MG/2ML IJ SOLN
INTRAMUSCULAR | Status: AC
  Filled 2022-10-27: qty 2

## 2022-10-27 MED ORDER — MENTHOL 3 MG MT LOZG: 1.0000 | LOZENGE | OROMUCOSAL | Status: DC | PRN

## 2022-10-27 MED ORDER — POVIDONE-IODINE 10 % EX SWAB: 2.0000 | Freq: Once | CUTANEOUS | Status: DC

## 2022-10-27 MED ORDER — BUPROPION HCL ER (XL) 300 MG PO TB24
300.0000 mg | ORAL_TABLET | Freq: Every day | ORAL | Status: DC
  Administered 2022-10-27 – 2022-10-29 (×3): 300 mg via ORAL
  Filled 2022-10-27 (×3): qty 1

## 2022-10-27 MED ORDER — PHENYLEPHRINE HCL-NACL 20-0.9 MG/250ML-% IV SOLN
INTRAVENOUS | Status: DC | PRN
  Administered 2022-10-27: 15 ug/min via INTRAVENOUS

## 2022-10-27 MED ORDER — PHENYLEPHRINE 80 MCG/ML (10ML) SYRINGE FOR IV PUSH (FOR BLOOD PRESSURE SUPPORT)
PREFILLED_SYRINGE | INTRAVENOUS | Status: DC | PRN
  Administered 2022-10-27 (×3): 80 ug via INTRAVENOUS

## 2022-10-27 MED ORDER — ACETAMINOPHEN 325 MG PO TABS: 325.0000 mg | ORAL_TABLET | Freq: Four times a day (QID) | ORAL | Status: DC | PRN

## 2022-10-27 MED ORDER — FENTANYL CITRATE PF 50 MCG/ML IJ SOSY
50.0000 ug | PREFILLED_SYRINGE | INTRAMUSCULAR | Status: DC
  Administered 2022-10-27: 50 ug via INTRAVENOUS
  Filled 2022-10-27: qty 2

## 2022-10-27 MED ORDER — PHENYLEPHRINE HCL (PRESSORS) 10 MG/ML IV SOLN
INTRAVENOUS | Status: AC
  Filled 2022-10-27: qty 1

## 2022-10-27 MED ORDER — ONDANSETRON HCL 4 MG PO TABS: 4.0000 mg | ORAL_TABLET | Freq: Four times a day (QID) | ORAL | Status: DC | PRN

## 2022-10-27 MED ORDER — ONDANSETRON HCL 4 MG/2ML IJ SOLN
4.0000 mg | Freq: Four times a day (QID) | INTRAMUSCULAR | Status: DC | PRN
  Administered 2022-10-27: 4 mg via INTRAVENOUS
  Filled 2022-10-27: qty 2

## 2022-10-27 MED ORDER — ORAL CARE MOUTH RINSE: 15.0000 mL | Freq: Once | OROMUCOSAL | Status: AC

## 2022-10-27 MED ORDER — DOCUSATE SODIUM 100 MG PO CAPS
100.0000 mg | ORAL_CAPSULE | Freq: Two times a day (BID) | ORAL | Status: DC
  Administered 2022-10-27 – 2022-10-29 (×5): 100 mg via ORAL
  Filled 2022-10-27 (×5): qty 1

## 2022-10-27 MED ORDER — METOCLOPRAMIDE HCL 5 MG PO TABS: 5.0000 mg | ORAL_TABLET | Freq: Three times a day (TID) | ORAL | Status: DC | PRN

## 2022-10-27 MED ORDER — TRANEXAMIC ACID-NACL 1000-0.7 MG/100ML-% IV SOLN
1000.0000 mg | INTRAVENOUS | Status: AC
  Administered 2022-10-27: 1000 mg via INTRAVENOUS
  Filled 2022-10-27: qty 100

## 2022-10-27 MED ORDER — EPINEPHRINE PF 1 MG/ML IJ SOLN
INTRAMUSCULAR | Status: AC
  Filled 2022-10-27: qty 1

## 2022-10-27 MED ORDER — ALUM & MAG HYDROXIDE-SIMETH 200-200-20 MG/5ML PO SUSP: 30.0000 mL | ORAL | Status: DC | PRN

## 2022-10-27 MED ORDER — CHLORHEXIDINE GLUCONATE 0.12 % MT SOLN
15.0000 mL | Freq: Once | OROMUCOSAL | Status: AC
  Administered 2022-10-27: 15 mL via OROMUCOSAL

## 2022-10-27 MED ORDER — ORAL CARE MOUTH RINSE: 15.0000 mL | OROMUCOSAL | Status: DC | PRN

## 2022-10-27 MED ORDER — PROMETHAZINE HCL 25 MG/ML IJ SOLN: 6.2500 mg | INTRAMUSCULAR | Status: DC | PRN

## 2022-10-27 MED ORDER — BUPIVACAINE HCL (PF) 0.25 % IJ SOLN
INTRAMUSCULAR | Status: AC
  Filled 2022-10-27: qty 30

## 2022-10-27 MED ORDER — ROPIVACAINE HCL 7.5 MG/ML IJ SOLN
INTRAMUSCULAR | Status: DC | PRN
  Administered 2022-10-27: 20 mL via PERINEURAL

## 2022-10-27 MED ORDER — FLUOXETINE HCL 20 MG PO CAPS
40.0000 mg | ORAL_CAPSULE | Freq: Every day | ORAL | Status: DC
  Administered 2022-10-27 – 2022-10-29 (×3): 40 mg via ORAL
  Filled 2022-10-27 (×3): qty 2

## 2022-10-27 MED ORDER — DORZOLAMIDE HCL-TIMOLOL MAL 2-0.5 % OP SOLN
1.0000 [drp] | Freq: Two times a day (BID) | OPHTHALMIC | Status: DC
  Administered 2022-10-27 – 2022-10-29 (×4): 1 [drp] via OPHTHALMIC
  Filled 2022-10-27: qty 10

## 2022-10-27 MED ORDER — METOCLOPRAMIDE HCL 5 MG/ML IJ SOLN
5.0000 mg | Freq: Three times a day (TID) | INTRAMUSCULAR | Status: DC | PRN
  Administered 2022-10-28: 10 mg via INTRAVENOUS
  Filled 2022-10-27: qty 2

## 2022-10-27 MED ORDER — LACTATED RINGERS IV SOLN: INTRAVENOUS | Status: DC

## 2022-10-27 MED ORDER — PROPOFOL 10 MG/ML IV BOLUS
INTRAVENOUS | Status: AC
  Filled 2022-10-27: qty 20

## 2022-10-27 MED ORDER — ONDANSETRON HCL 4 MG/2ML IJ SOLN
INTRAMUSCULAR | Status: DC | PRN
  Administered 2022-10-27: 4 mg via INTRAVENOUS

## 2022-10-27 MED ORDER — ASPIRIN 81 MG PO CHEW
81.0000 mg | CHEWABLE_TABLET | Freq: Two times a day (BID) | ORAL | Status: DC
  Administered 2022-10-27 – 2022-10-29 (×4): 81 mg via ORAL
  Filled 2022-10-27 (×4): qty 1

## 2022-10-27 MED ORDER — HYDROMORPHONE HCL 1 MG/ML IJ SOLN: 0.2500 mg | INTRAMUSCULAR | Status: DC | PRN

## 2022-10-27 MED ORDER — PROPOFOL 10 MG/ML IV BOLUS
INTRAVENOUS | Status: DC | PRN
  Administered 2022-10-27: 20 mg via INTRAVENOUS
  Administered 2022-10-27: 10 mg via INTRAVENOUS

## 2022-10-27 MED ORDER — MAGNESIUM HYDROXIDE 400 MG/5ML PO SUSP: 30.0000 mL | Freq: Every day | ORAL | Status: DC | PRN

## 2022-10-27 MED ORDER — VITAMIN D 25 MCG (1000 UNIT) PO TABS
2000.0000 [IU] | ORAL_TABLET | Freq: Every day | ORAL | Status: DC
  Administered 2022-10-27 – 2022-10-29 (×3): 2000 [IU] via ORAL
  Filled 2022-10-27 (×4): qty 2

## 2022-10-27 MED ORDER — LEVOTHYROXINE SODIUM 75 MCG PO TABS
75.0000 ug | ORAL_TABLET | Freq: Every day | ORAL | Status: DC
  Administered 2022-10-28 – 2022-10-29 (×2): 75 ug via ORAL
  Filled 2022-10-27 (×2): qty 1

## 2022-10-27 MED ORDER — MEPERIDINE HCL 50 MG/ML IJ SOLN: 6.2500 mg | INTRAMUSCULAR | Status: DC | PRN

## 2022-10-27 MED ORDER — OXYCODONE HCL 5 MG/5ML PO SOLN: 5.0000 mg | Freq: Once | ORAL | Status: DC | PRN

## 2022-10-27 MED ORDER — DIPHENHYDRAMINE HCL 12.5 MG/5ML PO ELIX: 12.5000 mg | ORAL_SOLUTION | ORAL | Status: DC | PRN

## 2022-10-27 MED ORDER — SODIUM CHLORIDE 0.9 % IR SOLN
Status: DC | PRN
  Administered 2022-10-27: 1000 mL

## 2022-10-27 MED ORDER — PROPOFOL 1000 MG/100ML IV EMUL
INTRAVENOUS | Status: AC
  Filled 2022-10-27: qty 100

## 2022-10-27 MED ORDER — BUPIVACAINE-EPINEPHRINE 0.25% -1:200000 IJ SOLN
INTRAMUSCULAR | Status: DC | PRN
  Administered 2022-10-27: 30 mL

## 2022-10-27 MED ORDER — PANTOPRAZOLE SODIUM 40 MG PO TBEC
40.0000 mg | DELAYED_RELEASE_TABLET | Freq: Every day | ORAL | Status: DC
  Administered 2022-10-27 – 2022-10-29 (×3): 40 mg via ORAL
  Filled 2022-10-27 (×3): qty 1

## 2022-10-27 SURGICAL SUPPLY — 68 items
APL SKNCLS STERI-STRIP NONHPOA (GAUZE/BANDAGES/DRESSINGS) ×1
BAG COUNTER SPONGE SURGICOUNT (BAG) IMPLANT
BAG SPEC THK2 15X12 ZIP CLS (MISCELLANEOUS) ×1
BAG SPNG CNTER NS LX DISP (BAG) ×1
BAG ZIPLOCK 12X15 (MISCELLANEOUS) ×1 IMPLANT
BENZOIN TINCTURE PRP APPL 2/3 (GAUZE/BANDAGES/DRESSINGS) IMPLANT
BLADE SAG 18X100X1.27 (BLADE) ×1 IMPLANT
BLADE SURG SZ10 CARB STEEL (BLADE) ×2 IMPLANT
BNDG CMPR 5X62 HK CLSR LF (GAUZE/BANDAGES/DRESSINGS) ×2
BNDG CMPR MED 15X6 ELC VLCR LF (GAUZE/BANDAGES/DRESSINGS) ×2
BNDG ELASTIC 6INX 5YD STR LF (GAUZE/BANDAGES/DRESSINGS) ×2 IMPLANT
BNDG ELASTIC 6X15 VLCR STRL LF (GAUZE/BANDAGES/DRESSINGS) IMPLANT
BOWL SMART MIX CTS (DISPOSABLE) IMPLANT
CEMENT BONE R 1X40 (Cement) IMPLANT
CEMENT BONE SIMPLEX SPEEDSET (Cement) IMPLANT
COMP PATELLAR 29 STD 8 THK (Orthopedic Implant) IMPLANT
COMP TIB PS KNEE D 0D LT (Joint) ×1 IMPLANT
COMPONET TIB PS KNEE D 0D LT (Joint) IMPLANT
COOLER ICEMAN CLASSIC (MISCELLANEOUS) ×1 IMPLANT
COVER SURGICAL LIGHT HANDLE (MISCELLANEOUS) ×1 IMPLANT
CUFF TOURN SGL QUICK 34 (TOURNIQUET CUFF) ×1
CUFF TRNQT CYL 34X4.125X (TOURNIQUET CUFF) ×1 IMPLANT
DRAPE INCISE IOBAN 66X45 STRL (DRAPES) ×1 IMPLANT
DRAPE U-SHAPE 47X51 STRL (DRAPES) ×1 IMPLANT
DURAPREP 26ML APPLICATOR (WOUND CARE) ×1 IMPLANT
ELECT BLADE TIP CTD 4 INCH (ELECTRODE) ×1 IMPLANT
ELECT REM PT RETURN 15FT ADLT (MISCELLANEOUS) ×1 IMPLANT
FEMORAL KNEE COMP SZ 8 STND LT (Knees) ×1 IMPLANT
FEMORAL KNEE COMP SZ 8STD LT (Knees) IMPLANT
FILTER STRAW (MISCELLANEOUS) IMPLANT
GAUZE PAD ABD 8X10 STRL (GAUZE/BANDAGES/DRESSINGS) ×2 IMPLANT
GAUZE SPONGE 4X4 12PLY STRL (GAUZE/BANDAGES/DRESSINGS) ×1 IMPLANT
GAUZE XEROFORM 1X8 LF (GAUZE/BANDAGES/DRESSINGS) IMPLANT
GLOVE BIO SURGEON STRL SZ7.5 (GLOVE) ×1 IMPLANT
GLOVE BIOGEL PI IND STRL 8 (GLOVE) ×2 IMPLANT
GLOVE ECLIPSE 8.0 STRL XLNG CF (GLOVE) ×1 IMPLANT
GOWN STRL REUS W/ TWL XL LVL3 (GOWN DISPOSABLE) ×2 IMPLANT
GOWN STRL REUS W/TWL XL LVL3 (GOWN DISPOSABLE) ×2
HANDPIECE INTERPULSE COAX TIP (DISPOSABLE) ×1
HOLDER FOLEY CATH W/STRAP (MISCELLANEOUS) IMPLANT
IMMOBILIZER KNEE 20 (SOFTGOODS) ×1
IMMOBILIZER KNEE 20 THIGH 36 (SOFTGOODS) ×1 IMPLANT
INSERT TIB KNEE CD/8-9 14 LT (Insert) IMPLANT
KIT TURNOVER KIT A (KITS) IMPLANT
NDL SIDE PORT 18GA QUICK PRESS (NEEDLE) IMPLANT
NS IRRIG 1000ML POUR BTL (IV SOLUTION) ×1 IMPLANT
PACK TOTAL KNEE CUSTOM (KITS) ×1 IMPLANT
PAD COLD SHLDR WRAP-ON (PAD) ×1 IMPLANT
PADDING CAST COTTON 6X4 STRL (CAST SUPPLIES) ×2 IMPLANT
PATELLA ZIMMER 29MM (Orthopedic Implant) ×1 IMPLANT
PIN DRILL HDLS TROCAR 75 4PK (PIN) IMPLANT
PROTECTOR NERVE ULNAR (MISCELLANEOUS) ×1 IMPLANT
SCREW FEMALE HEX FIX 25X2.5 (ORTHOPEDIC DISPOSABLE SUPPLIES) IMPLANT
SET HNDPC FAN SPRY TIP SCT (DISPOSABLE) ×1 IMPLANT
SET PAD KNEE POSITIONER (MISCELLANEOUS) ×1 IMPLANT
SPIKE FLUID TRANSFER (MISCELLANEOUS) IMPLANT
STAPLER VISISTAT 35W (STAPLE) IMPLANT
STRIP CLOSURE SKIN 1/2X4 (GAUZE/BANDAGES/DRESSINGS) IMPLANT
SUT MNCRL AB 4-0 PS2 18 (SUTURE) IMPLANT
SUT VIC AB 0 CT1 27 (SUTURE) ×1
SUT VIC AB 0 CT1 27XBRD ANTBC (SUTURE) ×1 IMPLANT
SUT VIC AB 1 CT1 36 (SUTURE) ×2 IMPLANT
SUT VIC AB 2-0 CT1 27 (SUTURE) ×2
SUT VIC AB 2-0 CT1 TAPERPNT 27 (SUTURE) ×2 IMPLANT
SYR 27GX1/2 1ML LL SAFETY (SYRINGE) IMPLANT
TRAY FOLEY MTR SLVR 14FR STAT (SET/KITS/TRAYS/PACK) IMPLANT
TRAY FOLEY MTR SLVR 16FR STAT (SET/KITS/TRAYS/PACK) IMPLANT
WATER STERILE IRR 1000ML POUR (IV SOLUTION) ×2 IMPLANT

## 2022-10-27 NOTE — Anesthesia Postprocedure Evaluation (Signed)
Anesthesia Post Note  Patient: Vanessa Christensen  Procedure(s) Performed: LEFT TOTAL KNEE ARTHROPLASTY (Left: Knee)     Patient location during evaluation: PACU Anesthesia Type: Spinal and Regional Level of consciousness: oriented, awake and alert and patient cooperative Pain management: pain level controlled Vital Signs Assessment: post-procedure vital signs reviewed and stable Respiratory status: spontaneous breathing, respiratory function stable and nonlabored ventilation Cardiovascular status: blood pressure returned to baseline and stable Postop Assessment: no headache, no backache, no apparent nausea or vomiting and spinal receding Anesthetic complications: no   No notable events documented.  Last Vitals:  Vitals:   10/27/22 1345 10/27/22 1400  BP: (!) 152/75 (!) 156/88  Pulse: 63 65  Resp: 14 11  Temp:    SpO2: 99% 96%    Last Pain:  Vitals:   10/27/22 1330  TempSrc:   PainSc: 0-No pain                 [name],E. Larine Fielding

## 2022-10-27 NOTE — Evaluation (Signed)
Physical Therapy Evaluation Patient Details Name: Vanessa Christensen MRN: [id number] DOB: [birth year] Today's Date: 10/27/2022  History of Present Illness  72 yo female presents to therapy s/p L TKA on 10/27/2022 due to failure of conservative measures. 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  Clinical Impression    Vanessa Christensen is a 72 y.o. female POD 0 s/p L TKA. Patient reports IND with mobility at baseline. Patient is now limited by functional impairments (see PT problem list below) and requires min guard for bed mobility and min guard and cues for transfers. Patient was able to ambulate 35 feet with RW and min guard level of assist. Patient instructed in exercise to facilitate ROM and circulation to manage edema. Patient will benefit from continued skilled PT interventions to address impairments and progress towards PLOF. Acute PT will follow to progress mobility and stair training in preparation for safe discharge home with family assist and [facility] services.      Recommendations for follow up therapy are one component of a multi-disciplinary discharge planning process, led by the attending physician.  Recommendations may be updated based on patient status, additional functional criteria and insurance authorization.  Follow Up Recommendations       Assistance Recommended at Discharge Intermittent Supervision/Assistance  Patient can return home with the following  A little help with walking and/or transfers;A little help with bathing/dressing/bathroom;Assistance with cooking/housework;Assist for transportation;Help with stairs or ramp for entrance    Equipment Recommendations None recommended by PT (pt reports DME in home setting)  Recommendations for Other Services       Functional Status Assessment Patient has had a recent decline in their functional status and demonstrates  the ability to make significant improvements in function in a reasonable and predictable amount of time.     Precautions / Restrictions Precautions Precautions: Knee;Fall Restrictions Weight Bearing Restrictions: No      Mobility  Bed Mobility Overal bed mobility: Needs Assistance Bed Mobility: Supine to Sit     Supine to sit: Min guard     General bed mobility comments: cues, HOB elevated    Transfers Overall transfer level: Needs assistance Equipment used: Rolling walker (2 wheels) Transfers: Sit to/from Stand Sit to Stand: Min guard           General transfer comment: min cues for proper UE placement reports of mild dizziness with immediate standing    Ambulation/Gait Ambulation/Gait assistance: Min guard Gait Distance (Feet): 35 Feet Assistive device: Rolling walker (2 wheels) Gait Pattern/deviations: Step-to pattern, Antalgic Gait velocity: decreased     General Gait Details: lateral sway, reliance on B UE support to offload LLE in stance phase.  Stairs            Wheelchair Mobility    Modified Rankin (Stroke Patients Only)       Balance Overall balance assessment: History of Falls, Needs assistance Sitting-balance support: Feet supported Sitting balance-Leahy Scale: Good     Standing balance support: Bilateral upper extremity supported, During functional activity, Reliant on assistive device for balance Standing balance-Leahy Scale: Poor                               Pertinent Vitals/Pain Pain Assessment Pain Assessment: 0-10 Pain Score: 4  Pain Location: L knee Pain Descriptors / Indicators: Constant, Discomfort, Operative  site guarding, Aching Pain Intervention(s): Limited activity within patient's tolerance, Monitored during session, Repositioned, Premedicated before session, Ice applied    Home Living Family/patient expects to be discharged to:: Private residence Living Arrangements: Spouse/significant  other Available Help at Discharge: Family Type of Home: House Home Access: Stairs to enter Entrance Stairs-Rails: Can reach both;Left;Right Entrance Stairs-Number of Steps: 2   Home Layout: Two level;Able to live on main level with bedroom/bathroom Home Equipment: Rolling Walker (2 wheels);BSC/3in1;Cane - single point      Prior Function Prior Level of Function : Independent/Modified Independent             Mobility Comments: IND with all ADLs, self care tasks, IADLs, driving       Hand Dominance        Extremity/Trunk Assessment        Lower Extremity Assessment Lower Extremity Assessment: LLE deficits/detail LLE Deficits / Details: ankle DF/PF 5/5; SLR < 10 degree lag LLE Sensation: WNL (reports R LE abn senstaion s/p TKA)    Cervical / Trunk Assessment Cervical / Trunk Assessment: Normal  Communication   Communication: No difficulties  Cognition Arousal/Alertness: Awake/alert Behavior During Therapy: WFL for tasks assessed/performed Overall Cognitive Status: Within Functional Limits for tasks assessed                                          General Comments      Exercises Total Joint Exercises Ankle Circles/Pumps: AROM, Both, 20 reps   Assessment/Plan    PT Assessment Patient needs continued PT services  PT Problem List Decreased strength;Decreased range of motion;Decreased activity tolerance;Decreased balance;Decreased mobility;Decreased coordination;Pain       PT Treatment Interventions DME instruction;Gait training;Stair training;Functional mobility training;Therapeutic activities;Therapeutic exercise;Balance training;Neuromuscular re-education;Patient/family education;Modalities    PT Goals (Current goals can be found in the Care Plan section)  Acute Rehab PT Goals Patient Stated Goal: walk and go back to the gym/silver sneakers PT Goal Formulation: With patient Time For Goal Achievement: 11/10/22 Potential to Achieve Goals:  Good    Frequency 7X/week     Co-evaluation               AM-PAC PT "6 Clicks" Mobility  Outcome Measure Help needed turning from your back to your side while in a flat bed without using bedrails?: None Help needed moving from lying on your back to sitting on the side of a flat bed without using bedrails?: A Little Help needed moving to and from a bed to a chair (including a wheelchair)?: A Little Help needed standing up from a chair using your arms (e.g., wheelchair or bedside chair)?: A Little Help needed to walk in hospital room?: A Little Help needed climbing 3-5 steps with a railing? : A Lot 6 Click Score: 18    End of Session Equipment Utilized During Treatment: Gait belt Activity Tolerance: Patient tolerated treatment well Patient left: in chair;with call bell/phone within reach;with chair alarm set;with family/visitor present Nurse Communication: Mobility status PT Visit Diagnosis: Unsteadiness on feet (R26.81);Other abnormalities of gait and mobility (R26.89);Muscle weakness (generalized) (M62.81);History of falling (Z91.81);Difficulty in walking, not elsewhere classified (R26.2);Pain Pain - Right/Left: Left Pain - part of body: Knee    Time: 1610-9604 PT Time Calculation (min) (ACUTE ONLY): 28 min   Charges:   PT Evaluation $PT Eval Low Complexity: 1 Low PT Treatments $Gait Training: 8-22 mins  Johnny Bridge, PT Acute Rehab   Jacqualyn Posey 10/27/2022, 5:11 PM

## 2022-10-27 NOTE — Transfer of Care (Signed)
Immediate Anesthesia Transfer of Care Note  Patient: Vanessa Christensen  Procedure(s) Performed: LEFT TOTAL KNEE ARTHROPLASTY (Left: Knee)  Patient Location: PACU  Anesthesia Type:Spinal and MAC combined with regional for post-op pain  Level of Consciousness: awake, alert , oriented, and patient cooperative  Airway & Oxygen Therapy: Patient Spontanous Breathing and Patient connected to nasal cannula oxygen  Post-op Assessment: Report given to RN and Post -op Vital signs reviewed and stable  Post vital signs: Reviewed and stable  Last Vitals:  Vitals Value Taken Time  BP 125/76 10/27/22 1307  Temp    Pulse 77 10/27/22 1311  Resp 13 10/27/22 1311  SpO2 99 % 10/27/22 1311  Vitals shown include unvalidated device data.  Last Pain:  Vitals:   10/27/22 1028  TempSrc:   PainSc: 0-No pain         Complications: No notable events documented.

## 2022-10-27 NOTE — Anesthesia Procedure Notes (Addendum)
Spinal  Patient location during procedure: OR End time: 10/27/2022 11:06 AM Reason for block: surgical anesthesia Staffing Performed: anesthesiologist  Anesthesiologist: Jairo Ben, MD Performed by: Jairo Ben, MD Authorized by: Jairo Ben, MD   Preanesthetic Checklist Completed: patient identified, IV checked, site marked, risks and benefits discussed, surgical consent, monitors and equipment checked, pre-op evaluation and timeout performed Spinal Block Patient position: sitting Prep: DuraPrep Patient monitoring: heart rate, cardiac monitor, continuous pulse ox and blood pressure Approach: midline Location: L3-4 Injection technique: single-shot Needle Needle type: Pencan and Introducer  Needle gauge: 24 G Needle length: 9 cm Assessment Sensory level: T4 Events: CSF return Additional Notes Pt identified in Operating room.  Monitors applied. Working IV access confirmed. Sterile prep, drape lumbar spine.  1% lido local L 3,4.  #24ga Pencan into clear CSF L 3,4.  12mg  0.75% Bupivacaine with dextrose injected with asp CSF beginning and end of injection.  Patient asymptomatic, VSS, no heme aspirated, tolerated well.  Sandford Craze, MD

## 2022-10-27 NOTE — Plan of Care (Signed)
  Problem: Safety: Goal: Ability to remain free from injury will improve Outcome: Progressing   Problem: Education: Goal: Knowledge of the prescribed therapeutic regimen will improve Outcome: Progressing   Problem: Activity: Goal: Range of joint motion will improve Outcome: Progressing   Problem: Pain Management: Goal: Pain level will decrease with appropriate interventions Outcome: Progressing

## 2022-10-27 NOTE — Interval H&P Note (Signed)
History and Physical Interval Note: The patient understands that she is here today for a left total knee replacement to treat her severe left knee arthritis.  There has been no acute or interval change in her medical status.  The risks and benefits of surgery have been discussed in detail and informed consent is obtained.  The left operative knee has been marked.  10/27/2022 9:46 AM  Vanessa Christensen  has presented today for surgery, with the diagnosis of OSTEOARTHRITIS LEFT KNEE.  The various methods of treatment have been discussed with the patient and family. After consideration of risks, benefits and other options for treatment, the patient has consented to  Procedure(s): LEFT TOTAL KNEE ARTHROPLASTY (Left) as a surgical intervention.  The patient's history has been reviewed, patient examined, no change in status, stable for surgery.  I have reviewed the patient's chart and labs.  Questions were answered to the patient's satisfaction.     [name]

## 2022-10-27 NOTE — Anesthesia Procedure Notes (Addendum)
Anesthesia Regional Block: Adductor canal block   Pre-Anesthetic Checklist: , timeout performed,  Correct Patient, Correct Site, Correct Laterality,  Correct Procedure, Correct Position, site marked,  Risks and benefits discussed,  Surgical consent,  Pre-op evaluation,  At surgeon's request and post-op pain management  Laterality: Left and Lower  Prep: chloraprep       Needles:  Injection technique: Single-shot  Needle Type: Echogenic Needle     Needle Length: 9cm  Needle Gauge: 21     Additional Needles:   Procedures:,,,, ultrasound used (permanent image in chart),,    Narrative:  Start time: 10/27/2022 10:11 AM End time: 10/27/2022 10:17 AM Injection made incrementally with aspirations every 5 mL.  Performed by: Personally  Anesthesiologist: Jairo Ben, MD  Additional Notes: Pt identified in Holding room.  Monitors applied. Working IV access confirmed. Timeout, Sterile prep L thigh.  #21ga ECHOgenic Arrow block needle into adductor canal with US guidance.  20cc 0.75% Ropivacaine injected incrementally after negative test dose.  Patient asymptomatic, VSS, no heme aspirated, tolerated well.   Sandford Craze, MD

## 2022-10-27 NOTE — Anesthesia Procedure Notes (Signed)
Procedure Name: MAC Date/Time: 10/27/2022 11:01 AM  Performed by: Wynonia Sours, CRNAPre-anesthesia Checklist: Patient identified, Emergency Drugs available, Suction available, Patient being monitored and Timeout performed Patient Re-evaluated:Patient Re-evaluated prior to induction Oxygen Delivery Method: Simple face mask Preoxygenation: Pre-oxygenation with 100% oxygen Induction Type: IV induction Placement Confirmation: positive ETCO2 Dental Injury: Teeth and Oropharynx as per pre-operative assessment

## 2022-10-27 NOTE — Anesthesia Preprocedure Evaluation (Addendum)
Anesthesia Evaluation  Patient identified by MRN, date of birth, ID band Patient awake    Reviewed: Allergy & Precautions, NPO status , Patient's Chart, lab work & pertinent test results  History of Anesthesia Complications Negative for: history of anesthetic complications  Airway Mallampati: II  TM Distance: >3 FB Neck ROM: Full    Dental  (+) Dental Advisory Given   Pulmonary sleep apnea (no longer uses CPAP) , former smoker   breath sounds clear to auscultation       Cardiovascular hypertension (no medications), (-) angina  Rhythm:Regular Rate:Normal     Neuro/Psych   Anxiety Depression    glaucoma    GI/Hepatic Neg liver ROS,GERD  Controlled,,  Endo/Other  Hypothyroidism  BMI 38  Renal/GU negative Renal ROS     Musculoskeletal  (+) Arthritis ,    Abdominal   Peds  Hematology Hb 11.9, plt 157k   Anesthesia Other Findings H/o breast cancer  Reproductive/Obstetrics                             Anesthesia Physical Anesthesia Plan  ASA: 3  Anesthesia Plan: Spinal   Post-op Pain Management: Tylenol PO (pre-op)* and Regional block*   Induction: Intravenous  PONV Risk Score and Plan: 2 and Ondansetron and Treatment may vary due to age or medical condition  Airway Management Planned: Natural Airway and Simple Face Mask  Additional Equipment: None  Intra-op Plan:   Post-operative Plan:   Informed Consent: I have reviewed the patients History and Physical, chart, labs and discussed the procedure including the risks, benefits and alternatives for the proposed anesthesia with the patient or authorized representative who has indicated his/her understanding and acceptance.     Dental advisory given  Plan Discussed with: CRNA and Surgeon  Anesthesia Plan Comments: (Plan routine monitors, SAB with adductor canal block for post op analgesia)        Anesthesia Quick  Evaluation

## 2022-10-27 NOTE — Op Note (Signed)
Operative Note  Date of operation: 10/27/2022 Preoperative diagnosis: Left knee primary osteoarthritis Postoperative diagnosis: Same  Procedure: Left cemented total knee arthroplasty  Implants: Biomet/Zimmer persona cemented knee system Implant Name Type Inv. Item Serial No. Manufacturer Lot No. LRB No. Used Action  CEMENT BONE R 1X40 - [id number] Cement CEMENT BONE R 1X40  ZIMMER RECON(ORTH,TRAU,BIO,SG) [id number] Left 2 Implanted  COMP TIB PS KNEE D 0D LT - [id number] Joint COMP TIB PS KNEE D 0D LT  ZIMMER RECON(ORTH,TRAU,BIO,SG) [id number] Left 1 Implanted  FEMORAL KNEE COMP SZ 8 STND LT - [id number] Knees FEMORAL KNEE COMP SZ 8 STND LT  ZIMMER RECON(ORTH,TRAU,BIO,SG) [id number] Left 1 Implanted  THE PERSONA KNEE SYSTEM HIGHLY CROSSLINKED POLYETHYLENE ARTICULAR SURFACE Orthopedic Implant   ZIMMER [id number] Left 1 Implanted  NEXGEN COMPLETE KNEE SOLUTION ALL POLY PATELLA Orthopedic Implant   ZIMMER [id number] Left 1 Implanted   Surgeon: Vanita Panda. Magnus Ivan, MD Assistant: Rexene Edison, PA-C  Anesthesia: #1 left lower extremity adductor canal block, #2 spinal, #3 local Antibiotics, IV Ancef Tourniquet time under 1 hour EBL: Less than 100 cc Complications: None  Indications: The patient is a 72 year old female well-known to me.  She has debilitating arthritis involving her left knee this been well-documented with x-ray findings and clinical exam findings.  She has tried and failed conservative treatment for well over a year now.  At this time her left knee pain is debilitating for her and it is detrimentally affecting her mobility, her quality of life, and her actives daily living to the point she does wish to proceed with a total knee arthroplasty.  She does have a knee replacement on her right side.  She is fully aware of the risk of acute blood loss anemia, nerve or vessel injury, fracture, infection, DVT, implant failure and wound healing issues.  She understands her goals are hopefully  decrease pain, improve mobility, and improve quality of life.  Procedure description: After informed consent was obtained and the appropriate left knee was marked, the patient had an adductor canal block of her left lower extremity performed in the holding room.  The patient was then brought to the operating room and set up on the operating table where spinal anesthesia was obtained.  She was then laid in the supine position on the operating table and a nonsterile tourniquet was placed around her upper left thigh.  Her left thigh, knee, leg, ankle and foot were prepped and draped with DuraPrep and sterile drapes including a sterile stockinette.  A timeout was called and she was identified as the correct patient and the correct left knee.  An Esmarch was then used to wrap the leg and the tourniquet inflated 300 mm of pressure.  We then made a direct midline incision over the patella and carried this proximally distally.  Dissection was carried down into the knee joint and a medial parapatellar arthrotomy was made.  A moderate joint effusion was encountered.  With the knee in a flexed position we found complete loss of cartilage of the medial compartment the knee and the patellofemoral joint.  Osteophytes removed from all 3 compartments as well as the ACL and remnants of the medial lateral meniscus.  With the knee in a flexed position we used the extramedullary cutting guide for making her proximal tibia cut correction varus and valgus and 7 degree slope.  We made this cut to take 2 mm off the low side.  Once we made the cut we did backed it down to more millimeters.  We then used a intramedullary cutting guide for our distal femoral cut setting this to the notch of the femur for a left knee at 5 degrees externally rotated and a 10 mm distal femoral cutting block.  Made this cut without difficulty and brought the knee back down to full extension and with a 10 mm extension block she slightly hyperextended.  We then  back to the femur with the knee extended and put a femoral sizing guide based off of the epicondylar axis.  Based off of this we chose a size 8 femur.  We put a 4-in-1 cutting block for size 8 femur made her anterior posterior cuts followed by her chamfer cuts.  We then back to the tibia and chose a size D left tibia for coverage over the tibial plateau setting the rotation off the tibial tubercle and femur.  We made a drill hole and keel punch over this.  We then trialed our size D left tibial tray combined with her size 8 left CR standard femur.  We placed a 12 mm fixed-bearing medial congruent left polythene insert and went up to a 14 mm insert.  We are pleased with range of motion and stability with a 14 mm insert.  We then made a patella cut and drilled 3 holes for a size 29 patella button.  With all trial instrumentation the knee we put her through several cycles of motion and we are pleased with range of motion and stability.  We then removed all instrumentation from the knee and irrigate the knee with normal saline solution.  We then placed our Marcaine with epinephrine around the arthrotomy.  The cement was mixed next and then with the knee in a flexed position we cemented our Biomet Zimmer persona tibial tray for left knee size D followed by cementing our size right CR standard left femur.  We placed a 14 mm medial congruent left fixed-bearing polythene insert and cemented our size 29 patella button.  We then held the knee fully extended and compressed while the cement hardened.  Once that it hardened the tourniquet was let down and hemostasis was obtained electrocautery.  The arthrotomy was closed with interrupted #1 Vicryl suture followed by 0 Vicryl close the deep tissue and 2-0 Vicryl to close subcutaneous tissue.  The skin was closed with staples.  Well-padded sterile dressing was applied.  The patient was taken recovery in stable condition with all final counts being correct and no complications  noted.  Rexene Edison PA-C did assist during the entire case and beginning to end and his assistance was medically necessary and crucial for soft tissue management and retraction, helping guide implant placement and a layered closure of the wound.

## 2022-10-28 DIAGNOSIS — Z853 Personal history of malignant neoplasm of breast: Secondary | ICD-10-CM | POA: Diagnosis not present

## 2022-10-28 DIAGNOSIS — Z79890 Hormone replacement therapy (postmenopausal): Secondary | ICD-10-CM | POA: Diagnosis not present

## 2022-10-28 DIAGNOSIS — I10 Essential (primary) hypertension: Secondary | ICD-10-CM | POA: Diagnosis present

## 2022-10-28 DIAGNOSIS — M25562 Pain in left knee: Secondary | ICD-10-CM | POA: Diagnosis present

## 2022-10-28 DIAGNOSIS — Z803 Family history of malignant neoplasm of breast: Secondary | ICD-10-CM | POA: Diagnosis not present

## 2022-10-28 DIAGNOSIS — N3281 Overactive bladder: Secondary | ICD-10-CM | POA: Diagnosis present

## 2022-10-28 DIAGNOSIS — M1712 Unilateral primary osteoarthritis, left knee: Secondary | ICD-10-CM | POA: Diagnosis present

## 2022-10-28 DIAGNOSIS — Z9013 Acquired absence of bilateral breasts and nipples: Secondary | ICD-10-CM | POA: Diagnosis not present

## 2022-10-28 DIAGNOSIS — H409 Unspecified glaucoma: Secondary | ICD-10-CM | POA: Diagnosis present

## 2022-10-28 DIAGNOSIS — M25762 Osteophyte, left knee: Secondary | ICD-10-CM | POA: Diagnosis present

## 2022-10-28 DIAGNOSIS — Z79899 Other long term (current) drug therapy: Secondary | ICD-10-CM | POA: Diagnosis not present

## 2022-10-28 DIAGNOSIS — Z800 Family history of malignant neoplasm of digestive organs: Secondary | ICD-10-CM | POA: Diagnosis not present

## 2022-10-28 DIAGNOSIS — Z96651 Presence of right artificial knee joint: Secondary | ICD-10-CM | POA: Diagnosis present

## 2022-10-28 DIAGNOSIS — Z83438 Family history of other disorder of lipoprotein metabolism and other lipidemia: Secondary | ICD-10-CM | POA: Diagnosis not present

## 2022-10-28 DIAGNOSIS — Z96652 Presence of left artificial knee joint: Secondary | ICD-10-CM | POA: Diagnosis present

## 2022-10-28 DIAGNOSIS — Z7982 Long term (current) use of aspirin: Secondary | ICD-10-CM | POA: Diagnosis not present

## 2022-10-28 DIAGNOSIS — Z87891 Personal history of nicotine dependence: Secondary | ICD-10-CM | POA: Diagnosis not present

## 2022-10-28 DIAGNOSIS — Z806 Family history of leukemia: Secondary | ICD-10-CM | POA: Diagnosis not present

## 2022-10-28 DIAGNOSIS — F419 Anxiety disorder, unspecified: Secondary | ICD-10-CM | POA: Diagnosis present

## 2022-10-28 DIAGNOSIS — Z823 Family history of stroke: Secondary | ICD-10-CM | POA: Diagnosis not present

## 2022-10-28 DIAGNOSIS — K219 Gastro-esophageal reflux disease without esophagitis: Secondary | ICD-10-CM | POA: Diagnosis present

## 2022-10-28 DIAGNOSIS — Z8249 Family history of ischemic heart disease and other diseases of the circulatory system: Secondary | ICD-10-CM | POA: Diagnosis not present

## 2022-10-28 DIAGNOSIS — E039 Hypothyroidism, unspecified: Secondary | ICD-10-CM | POA: Diagnosis present

## 2022-10-28 DIAGNOSIS — F32A Depression, unspecified: Secondary | ICD-10-CM | POA: Diagnosis present

## 2022-10-28 DIAGNOSIS — Z888 Allergy status to other drugs, medicaments and biological substances status: Secondary | ICD-10-CM | POA: Diagnosis not present

## 2022-10-28 LAB — BASIC METABOLIC PANEL
Anion gap: 8 (ref 5–15)
BUN: 12 mg/dL (ref 8–23)
CO2: 24 mmol/L (ref 22–32)
Calcium: 8.6 mg/dL — ABNORMAL LOW (ref 8.9–10.3)
Chloride: 99 mmol/L (ref 98–111)
Creatinine, Ser: 0.79 mg/dL (ref 0.44–1.00)
GFR, Estimated: >60 mL/min (ref >60)
Glucose, Bld: 131 mg/dL — ABNORMAL HIGH (ref 70–99)
Potassium: 3.8 mmol/L (ref 3.5–5.1)
Sodium: 131 mmol/L — ABNORMAL LOW (ref 135–145)

## 2022-10-28 LAB — CBC
HCT: 36.4 % (ref 36.0–46.0)
Hemoglobin: 12.2 g/dL (ref 12.0–15.0)
MCH: 29.7 pg (ref 26.0–34.0)
MCHC: 33.5 g/dL (ref 30.0–36.0)
MCV: 88.6 fL (ref 80.0–100.0)
Platelets: 161 10*3/uL (ref 150–400)
RBC: 4.11 MIL/uL (ref 3.87–5.11)
RDW: 13.8 % (ref 11.5–15.5)
WBC: 13.8 10*3/uL — ABNORMAL HIGH (ref 4.0–10.5)
nRBC: 0 % (ref 0.0–0.2)

## 2022-10-28 MED ORDER — GABAPENTIN 100 MG PO CAPS: 100.0000 mg | ORAL_CAPSULE | Freq: Three times a day (TID) | ORAL | 0 refills | Status: DC | PRN

## 2022-10-28 MED ORDER — SODIUM CHLORIDE 0.9% FLUSH: 10.0000 mL | Freq: Two times a day (BID) | INTRAVENOUS | Status: DC

## 2022-10-28 MED ORDER — OXYCODONE HCL 5 MG PO TABS: 5.0000 mg | ORAL_TABLET | ORAL | 0 refills | Status: DC | PRN

## 2022-10-28 MED ORDER — SODIUM CHLORIDE 0.9% FLUSH: 10.0000 mL | INTRAVENOUS | Status: DC | PRN

## 2022-10-28 MED ORDER — ASPIRIN 81 MG PO CHEW: 81.0000 mg | CHEWABLE_TABLET | Freq: Two times a day (BID) | ORAL | 0 refills | Status: DC

## 2022-10-28 MED ORDER — GABAPENTIN 100 MG PO CAPS
100.0000 mg | ORAL_CAPSULE | Freq: Three times a day (TID) | ORAL | Status: DC
  Administered 2022-10-28 – 2022-10-29 (×3): 100 mg via ORAL
  Filled 2022-10-28 (×3): qty 1

## 2022-10-28 MED ORDER — METHOCARBAMOL 500 MG PO TABS: 500.0000 mg | ORAL_TABLET | Freq: Four times a day (QID) | ORAL | 1 refills | Status: DC | PRN

## 2022-10-28 NOTE — Progress Notes (Signed)

## 2022-10-28 NOTE — Progress Notes (Signed)
Subjective: 1 Day Post-Op Procedure(s) (LRB): LEFT TOTAL KNEE ARTHROPLASTY (Left) Patient reports pain as moderate.  She is definitely struggling with pain and did struggle with therapy as well.  Her husband is now at the bedside.  Objective: Vital signs in last 24 hours: Temp:  [97.6 F (36.4 C)-99.5 F (37.5 C)] 98 F (36.7 C) (05/25 1003) Pulse Rate:  [63-108] 102 (05/25 1003) Resp:  [8-19] 19 (05/25 1003) BP: (123-171)/(73-110) 132/73 (05/25 1003) SpO2:  [94 %-99 %] 94 % (05/25 1003)  Intake/Output from previous day: 05/24 0701 - 05/25 0700 In: 2920.3 [P.O.:420; I.V.:2140.3; IV Piggyback:360] Out: 3000 [Urine:2750; Emesis/NG output:200; Blood:50] Intake/Output this shift: Total I/O In: 362.5 [P.O.:60; I.V.:302.5] Out: -   Recent Labs    10/27/22 1313 10/28/22 0336  HGB 10.9* 12.2   Recent Labs    10/27/22 1313 10/28/22 0336  WBC 3.7* 13.8*  RBC 3.72* 4.11  HCT 35.0* 36.4  PLT 144* 161   Recent Labs    10/27/22 1313 10/28/22 0336  NA 139 131*  K 4.1 3.8  CL 109 99  CO2 24 24  BUN 19 12  CREATININE 0.76 0.79  GLUCOSE 97 131*  CALCIUM 8.2* 8.6*   No results for input(s): "LABPT", "INR" in the last 72 hours.  Sensation intact distally Intact pulses distally Dorsiflexion/Plantar flexion intact Incision: dressing C/D/I No cellulitis present Compartment soft   Assessment/Plan: 1 Day Post-Op Procedure(s) (LRB): LEFT TOTAL KNEE ARTHROPLASTY (Left) Up with therapy She is definitely not safe today to go home from a mobility standpoint and also is having pain control issues.  Therapy work with her again this afternoon and we will check on her day-to-day in terms of determining when she can go home.  From utilization review standpoint, that team has my permission to transition her to an inpatient admission if that is appropriate.     [name] 10/28/2022, 11:30 AM

## 2022-10-28 NOTE — Discharge Instructions (Signed)

## 2022-10-28 NOTE — Progress Notes (Signed)
Physical Therapy Treatment Patient Details Name: Vanessa Christensen MRN: [id number] DOB: 11/28/1950 Today's Date: 10/28/2022   History of Present Illness 72 yo female presents to therapy s/p L TKA on 10/27/2022 due to failure of conservative measures. 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    PT Comments    Pt's cognition improved from this morning however spouse present and reports not yet back to her baseline.  Pt requiring at least min assist for mobility and only able to tolerate ambulating in her room. Will continue to assist pt with safely mobilizing in preparation for eventual d/c home.   Recommendations for follow up therapy are one component of a multi-disciplinary discharge planning process, led by the attending physician.  Recommendations may be updated based on patient status, additional functional criteria and insurance authorization.  Follow Up Recommendations       Assistance Recommended at Discharge Intermittent Supervision/Assistance  Patient can return home with the following A little help with walking and/or transfers;A little help with bathing/dressing/bathroom;Assistance with cooking/housework;Assist for transportation;Help with stairs or ramp for entrance   Equipment Recommendations  None recommended by PT    Recommendations for Other Services       Precautions / Restrictions Precautions Precautions: Knee;Fall Restrictions Weight Bearing Restrictions: No LLE Weight Bearing: Weight bearing as tolerated     Mobility  Bed Mobility Overal bed mobility: Needs Assistance Bed Mobility: Supine to Sit, Sit to Supine     Supine to sit: Min assist Sit to supine: Min assist   General bed mobility comments: assist for L LE    Transfers Overall transfer level: Needs assistance Equipment used: Rolling walker (2 wheels) Transfers: Sit to/from  Stand Sit to Stand: Min assist, +2 safety/equipment           General transfer comment: verbal cues for UE and LE positioning, assist to rise and steady as well as control descent    Ambulation/Gait Ambulation/Gait assistance: Min guard, Min assist Gait Distance (Feet): 12 Feet Assistive device: Rolling walker (2 wheels) Gait Pattern/deviations: Step-to pattern, Antalgic, Decreased stance time - left Gait velocity: decreased     General Gait Details: verbal cues for sequence, RW positioning, step length, posture; pt ambulated around bed but felt unable to tolerate ambulating into hallway   Stairs             Wheelchair Mobility    Modified Rankin (Stroke Patients Only)       Balance                                            Cognition Arousal/Alertness: Awake/alert Behavior During Therapy: WFL for tasks assessed/performed Overall Cognitive Status: Impaired/Different from baseline Area of Impairment: Following commands, Problem solving                       Following Commands: Follows one step commands inconsistently     Problem Solving: Slow processing, Difficulty sequencing General Comments: pt better able to follow commands but still a little slow with processing, spouse present and reports pt not quite at her baseline        Exercises     General Comments        Pertinent Vitals/Pain Pain Assessment Pain Assessment:  0-10 Pain Score: 5  Pain Location: L knee Pain Descriptors / Indicators: Discomfort, Sore Pain Intervention(s): Repositioned, Monitored during session    Home Living                          Prior Function            PT Goals (current goals can now be found in the care plan section) Progress towards PT goals: Progressing toward goals    Frequency    7X/week      PT Plan Current plan remains appropriate    Co-evaluation              AM-PAC PT "6 Clicks" Mobility    Outcome Measure  Help needed turning from your back to your side while in a flat bed without using bedrails?: A Little Help needed moving from lying on your back to sitting on the side of a flat bed without using bedrails?: A Little Help needed moving to and from a bed to a chair (including a wheelchair)?: A Little Help needed standing up from a chair using your arms (e.g., wheelchair or bedside chair)?: A Little Help needed to walk in hospital room?: A Little Help needed climbing 3-5 steps with a railing? : A Lot 6 Click Score: 17    End of Session Equipment Utilized During Treatment: Gait belt Activity Tolerance: Patient limited by fatigue Patient left: in bed;with call bell/phone within reach;with family/visitor present;with bed alarm set Nurse Communication: Mobility status PT Visit Diagnosis: Other abnormalities of gait and mobility (R26.89)     Time: 8295-6213 PT Time Calculation (min) (ACUTE ONLY): 17 min  Charges:  $Gait Training: 8-22 mins                     Paulino Door, DPT Physical Therapist Acute Rehabilitation Services Office: [phone]    Vanessa Christensen 10/28/2022, 3:55 PM

## 2022-10-28 NOTE — Plan of Care (Signed)
  Problem: Health Behavior/Discharge Planning: Goal: Ability to manage health-related needs will improve Outcome: Progressing   Problem: Activity: Goal: Risk for activity intolerance will decrease Outcome: Progressing   Problem: Nutrition: Goal: Adequate nutrition will be maintained Outcome: Progressing   Problem: Pain Managment: Goal: General experience of comfort will improve Outcome: Progressing   

## 2022-10-28 NOTE — Progress Notes (Signed)
Physical Therapy Treatment Patient Details Name: Vanessa Christensen MRN: [id number] DOB: 1951-05-08 Today's Date: 10/28/2022   History of Present Illness 72 yo female presents to therapy s/p L TKA on 10/27/2022 due to failure of conservative measures. 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    PT Comments    Pt very sleepy and groggy.  Pt performed exercises in recliner and then attempted to stand and march in place however requiring max +2 assist to stand and unable to shift weight.  Pt was OOB to recliner today with nursing and apparently mobilizing well.  Pt's decreased mobility and cognition this morning likely from pain meds provided prior to PT session.    Recommendations for follow up therapy are one component of a multi-disciplinary discharge planning process, led by the attending physician.  Recommendations may be updated based on patient status, additional functional criteria and insurance authorization.  Follow Up Recommendations       Assistance Recommended at Discharge Intermittent Supervision/Assistance  Patient can return home with the following A little help with walking and/or transfers;A little help with bathing/dressing/bathroom;Assistance with cooking/housework;Assist for transportation;Help with stairs or ramp for entrance   Equipment Recommendations  None recommended by PT    Recommendations for Other Services       Precautions / Restrictions Precautions Precautions: Knee;Fall Restrictions Weight Bearing Restrictions: No LLE Weight Bearing: Weight bearing as tolerated     Mobility  Bed Mobility               General bed mobility comments: pt in recliner    Transfers Overall transfer level: Needs assistance Equipment used: Rolling walker (2 wheels) Transfers: Sit to/from Stand Sit to Stand: Max assist, +2 physical assistance            General transfer comment: step by step cues for positioning and technique, attempted x3 and pt requiring increased assist to stand, unable to weight shift in standing so deferred ambulation    Ambulation/Gait                   Stairs             Wheelchair Mobility    Modified Rankin (Stroke Patients Only)       Balance                                            Cognition Arousal/Alertness: Lethargic, Suspect due to medications Behavior During Therapy: WFL for tasks assessed/performed Overall Cognitive Status: Impaired/Different from baseline Area of Impairment: Following commands, Problem solving                       Following Commands: Follows one step commands inconsistently     Problem Solving: Slow processing, Difficulty sequencing General Comments: suspect above from pain meds earlier        Exercises Total Joint Exercises Ankle Circles/Pumps: AROM, Both, 10 reps Quad Sets: AROM, Left, 10 reps Heel Slides: AAROM, Left, 10 reps Hip ABduction/ADduction: AAROM, Left, 10 reps Straight Leg Raises: AAROM, Left, 10 reps    General Comments        Pertinent Vitals/Pain Pain Assessment Pain Assessment: 0-10 Pain Score: 2  Pain Location: L knee Pain Descriptors / Indicators: Discomfort,  Sore Pain Intervention(s): Repositioned, Premedicated before session, Monitored during session    Home Living                          Prior Function            PT Goals (current goals can now be found in the care plan section) Progress towards PT goals: Progressing toward goals    Frequency    7X/week      PT Plan Current plan remains appropriate    Co-evaluation              AM-PAC PT "6 Clicks" Mobility   Outcome Measure  Help needed turning from your back to your side while in a flat bed without using bedrails?: A Lot Help needed moving from lying on your back to sitting on the side of a  flat bed without using bedrails?: A Lot Help needed moving to and from a bed to a chair (including a wheelchair)?: A Lot Help needed standing up from a chair using your arms (e.g., wheelchair or bedside chair)?: A Lot Help needed to walk in hospital room?: A Lot Help needed climbing 3-5 steps with a railing? : A Lot 6 Click Score: 12    End of Session Equipment Utilized During Treatment: Gait belt Activity Tolerance: Patient limited by fatigue Patient left: in chair;with call bell/phone within reach;with chair alarm set Nurse Communication: Mobility status PT Visit Diagnosis: Other abnormalities of gait and mobility (R26.89)     Time: 1610-9604 PT Time Calculation (min) (ACUTE ONLY): 23 min  Charges:  $Therapeutic Exercise: 8-22 mins $Therapeutic Activity: 8-22 mins                    Paulino Door, DPT Physical Therapist Acute Rehabilitation Services Office: [phone]    Janan Halter Payson 10/28/2022, 1:03 PM

## 2022-10-29 NOTE — Progress Notes (Signed)
Physical Therapy Treatment Patient Details Name: Vanessa Christensen MRN: [id number] DOB: 1950/11/20 Today's Date: 10/29/2022   History of Present Illness 72 yo female presents to therapy s/p L TKA on 10/27/2022 due to failure of conservative measures. 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    PT Comments    Pt ambulated into hallway and practiced safe stair technique.  Spouse present and educated as well.  Pt provided with HEP handout.  Pt and spouse feel ready for pt to d/c home today, and they had no further questions.    Recommendations for follow up therapy are one component of a multi-disciplinary discharge planning process, led by the attending physician.  Recommendations may be updated based on patient status, additional functional criteria and insurance authorization.  Follow Up Recommendations       Assistance Recommended at Discharge Intermittent Supervision/Assistance  Patient can return home with the following A little help with walking and/or transfers;A little help with bathing/dressing/bathroom;Assistance with cooking/housework;Assist for transportation;Help with stairs or ramp for entrance   Equipment Recommendations  None recommended by PT    Recommendations for Other Services       Precautions / Restrictions Precautions Precautions: Knee;Fall Restrictions LLE Weight Bearing: Weight bearing as tolerated     Mobility  Bed Mobility Overal bed mobility: Needs Assistance Bed Mobility: Supine to Sit     Supine to sit: Min assist     General bed mobility comments: assist for L LE, spouse educated on providing assist at home    Transfers Overall transfer level: Needs assistance Equipment used: Rolling walker (2 wheels) Transfers: Sit to/from Stand Sit to Stand: Min guard           General transfer comment: verbal cues for UE and LE  positioning    Ambulation/Gait Ambulation/Gait assistance: Min guard Gait Distance (Feet): 30 Feet Assistive device: Rolling walker (2 wheels) Gait Pattern/deviations: Step-to pattern, Antalgic, Decreased stance time - left Gait velocity: decreased     General Gait Details: verbal cues for sequence, RW positioning, step length, posture   Stairs Stairs: Yes Stairs assistance: Min guard Stair Management: Step to pattern, Forwards, Two rails Number of Stairs: 2 General stair comments: verbal cues for sequence and safety; spouse present and educated; both pt and spouse report understanding   Wheelchair Mobility    Modified Rankin (Stroke Patients Only)       Balance                                            Cognition Arousal/Alertness: Awake/alert Behavior During Therapy: WFL for tasks assessed/performed Overall Cognitive Status: Impaired/Different from baseline                                 General Comments: pt better able to follow commands but still a little slow with processing, spouse present and reports pt not quite at her baseline        Exercises     General Comments        Pertinent Vitals/Pain Pain Assessment Pain Assessment: 0-10 Pain Score: 4  Pain Location: L knee Pain Descriptors / Indicators: Discomfort, Sore Pain Intervention(s): Monitored during session, Repositioned  Home Living                          Prior Function            PT Goals (current goals can now be found in the care plan section) Progress towards PT goals: Progressing toward goals    Frequency    7X/week      PT Plan Current plan remains appropriate    Co-evaluation              AM-PAC PT "6 Clicks" Mobility   Outcome Measure  Help needed turning from your back to your side while in a flat bed without using bedrails?: A Little Help needed moving from lying on your back to sitting on the side of a flat  bed without using bedrails?: A Little Help needed moving to and from a bed to a chair (including a wheelchair)?: A Little Help needed standing up from a chair using your arms (e.g., wheelchair or bedside chair)?: A Little Help needed to walk in hospital room?: A Little Help needed climbing 3-5 steps with a railing? : A Little 6 Click Score: 18    End of Session Equipment Utilized During Treatment: Gait belt Activity Tolerance: Patient tolerated treatment well Patient left: with call bell/phone within reach;with family/visitor present;in bed   PT Visit Diagnosis: Other abnormalities of gait and mobility (R26.89)     Time: 1610-9604 PT Time Calculation (min) (ACUTE ONLY): 15 min  Charges:  $Gait Training: 8-22 mins                      Paulino Door, DPT Physical Therapist Acute Rehabilitation Services Office: [phone]     Vanessa Christensen 10/29/2022, 3:36 PM

## 2022-10-29 NOTE — TOC Initial Note (Signed)
Transition of Care [facility]) - Initial/Assessment Note    Patient Details  Name: Vanessa Christensen MRN: [id number] Date of Birth: 1951/03/17  Transition of Care [facility]) CM/SW Contact:    Adrian Prows, RN Phone Number: 10/29/2022, 2:11 PM  Clinical Narrative:                 TOC for d/c needs; spoke w/ pt and husband in room; they say pt is from home and plans to return at d/c; they verify [name] and HHPT has been arranged w/ [facility]; notified Katina at agency pt d/c today; agency contact info placed in follow-up provider section of d/c instructions; no TOC needs.  Expected Discharge Plan: Home w Home Health Services Barriers to Discharge: No Barriers Identified   Patient Goals and CMS Choice Patient states their goals for this hospitalization and ongoing recovery are:: home          Expected Discharge Plan and Services   Discharge Planning Services: CM Consult Post Acute Care Choice: NA Living arrangements for the past 2 months: Single Family Home Expected Discharge Date: 10/29/22               DME Arranged: N/A DME Agency: NA       HH Arranged: PT (HH previously arranged w/ [facility])          Prior Living Arrangements/Services Living arrangements for the past 2 months: Single Family Home Lives with:: Spouse Patient language and need for interpreter reviewed:: Yes Do you feel safe going back to the place where you live?: Yes      Need for Family Participation in Patient Care: Yes (Comment) Care giver support system in place?: Yes (comment) Current home services: DME (walker, 3-n-1) Criminal Activity/Legal Involvement Pertinent to Current Situation/Hospitalization: No - Comment as needed  Activities of Daily Living Home Assistive Devices/Equipment: Walker (specify type), Eyeglasses, Blood pressure cuff, Bedside commode/3-in-1, Shower chair with back ADL Screening (condition at time of admission) Patient's cognitive ability adequate to safely  complete daily activities?: Yes Is the patient deaf or have difficulty hearing?: No Does the patient have difficulty seeing, even when wearing glasses/contacts?: No Does the patient have difficulty concentrating, remembering, or making decisions?: No Patient able to express need for assistance with ADLs?: Yes Does the patient have difficulty dressing or bathing?: No Independently performs ADLs?: Yes (appropriate for developmental age) Does the patient have difficulty walking or climbing stairs?: Yes Weakness of Legs: Left Weakness of Arms/Hands: None  Permission Sought/Granted Permission sought to share information with : Case Manager Permission granted to share information with : Yes, Verbal Permission Granted  Share Information with NAME: Burnard Bunting, RN, CM     Permission granted to share info w Relationship: Steven Placeres (spouse) [phone]     Emotional Assessment Appearance:: Appears stated age Attitude/Demeanor/Rapport: Gracious Affect (typically observed): Accepting Orientation: : Oriented to Self, Oriented to Place, Oriented to  Time, Oriented to Situation Alcohol / Substance Use: Not Applicable Psych Involvement: No (comment)  Admission diagnosis:  Status post total left knee replacement [Z96.652] Patient Active Problem List   Diagnosis Date Noted   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   Generalized obesity 10/05/2022   Memory loss 06/12/2022   Essential hypertension 04/13/2022   Morbid obesity (HCC) 04/13/2022   Stress incontinence 11/08/2021   Unilateral primary osteoarthritis, left knee 12/27/2020   Status post revision of total replacement of right knee 05/07/2020   Failed total knee, right, 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/2020  History of total right knee replacement 01/28/2020   Vitamin D deficiency 08/29/2018   Insulin resistance 08/29/2018   Other specified glaucoma 08/14/2018   Incontinence of urine in female 11/07/2017   Vertigo  11/07/2017   Left knee pain 10/25/2016   Genetic testing 08/08/2016   Hypothyroidism 12/02/2015   Anxiety and depression 12/02/2015   OAB (overactive bladder) 12/02/2015   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/02/2015   BMI 37.0-37.9, adult 12/02/2015   PCP:  Sheliah Hatch, MD Pharmacy:   [phone] [place], [place] - [name] [place] [address] Phone: [phone] Fax: [phone]  [facility] [address], [place] - [address] [place] [address] [address] Phone: [phone] Fax: [phone]     Social Determinants of Health (SDOH) Social History: SDOH Screenings   Food Insecurity: No Food Insecurity (10/27/2022)  Housing: Low Risk  (10/27/2022)  Transportation Needs: No Transportation Needs (10/27/2022)  Utilities: Not At Risk (10/27/2022)  Alcohol Screen: Low Risk  (10/11/2022)  Depression (PHQ2-9): Low Risk  (10/11/2022)  Financial Resource Strain: Low Risk  (10/11/2022)  Physical Activity: Insufficiently Active (10/11/2022)  Social Connections: Socially Integrated (10/11/2022)  Stress: No Stress Concern Present (10/11/2022)  Tobacco Use: Medium Risk (10/27/2022)   SDOH Interventions:     Readmission Risk Interventions     No data to display

## 2022-10-29 NOTE — Progress Notes (Signed)
Physical Therapy Treatment Patient Details Name: Vanessa Christensen MRN: [id number] DOB: 1950/07/11 Today's Date: 10/29/2022   History of Present Illness 72 yo female presents to therapy s/p L TKA on 10/27/2022 due to failure of conservative measures. 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    PT Comments    Pt able to improve ambulation distance today and also more alert for performing exercises today.  Spouse present and observed session.  Pt anticipates practicing steps and hopeful for d/c later today.    Recommendations for follow up therapy are one component of a multi-disciplinary discharge planning process, led by the attending physician.  Recommendations may be updated based on patient status, additional functional criteria and insurance authorization.  Follow Up Recommendations       Assistance Recommended at Discharge Intermittent Supervision/Assistance  Patient can return home with the following A little help with walking and/or transfers;A little help with bathing/dressing/bathroom;Assistance with cooking/housework;Assist for transportation;Help with stairs or ramp for entrance   Equipment Recommendations  None recommended by PT    Recommendations for Other Services       Precautions / Restrictions Precautions Precautions: Knee;Fall Restrictions LLE Weight Bearing: Weight bearing as tolerated     Mobility  Bed Mobility Overal bed mobility: Needs Assistance Bed Mobility: Supine to Sit     Supine to sit: Min assist     General bed mobility comments: assist for L LE    Transfers Overall transfer level: Needs assistance Equipment used: Rolling walker (2 wheels) Transfers: Sit to/from Stand Sit to Stand: Min guard           General transfer comment: verbal cues for UE and LE positioning    Ambulation/Gait Ambulation/Gait assistance:  Min guard Gait Distance (Feet): 40 Feet Assistive device: Rolling walker (2 wheels) Gait Pattern/deviations: Step-to pattern, Antalgic, Decreased stance time - left Gait velocity: decreased     General Gait Details: verbal cues for sequence, RW positioning, step length, posture; pt ambulated to bathroom and then into hallway   Stairs             Wheelchair Mobility    Modified Rankin (Stroke Patients Only)       Balance                                            Cognition Arousal/Alertness: Awake/alert Behavior During Therapy: WFL for tasks assessed/performed Overall Cognitive Status: Impaired/Different from baseline                                 General Comments: pt better able to follow commands but still a little slow with processing, spouse present and reports pt not quite at her baseline        Exercises Total Joint Exercises Ankle Circles/Pumps: AROM, Both, 10 reps Quad Sets: AROM, Left, 10 reps Heel Slides: AAROM, Left, 10 reps, Seated Hip ABduction/ADduction: AAROM, Left, 10 reps Straight Leg Raises: AAROM, Left, 10 reps    General Comments        Pertinent Vitals/Pain Pain Assessment Pain Assessment: 0-10 Pain Score: 4  Pain Location: L knee Pain Descriptors / Indicators: Discomfort, Sore Pain Intervention(s): Repositioned, Monitored during session  Home Living                          Prior Function            PT Goals (current goals can now be found in the care plan section) Progress towards PT goals: Progressing toward goals    Frequency    7X/week      PT Plan Current plan remains appropriate    Co-evaluation              AM-PAC PT "6 Clicks" Mobility   Outcome Measure  Help needed turning from your back to your side while in a flat bed without using bedrails?: A Little Help needed moving from lying on your back to sitting on the side of a flat bed without using  bedrails?: A Little Help needed moving to and from a bed to a chair (including a wheelchair)?: A Little Help needed standing up from a chair using your arms (e.g., wheelchair or bedside chair)?: A Little Help needed to walk in hospital room?: A Little Help needed climbing 3-5 steps with a railing? : A Little 6 Click Score: 18    End of Session Equipment Utilized During Treatment: Gait belt Activity Tolerance: Patient limited by fatigue Patient left: with call bell/phone within reach;with family/visitor present;in chair;with chair alarm set   PT Visit Diagnosis: Other abnormalities of gait and mobility (R26.89)     Time: 4098-1191 PT Time Calculation (min) (ACUTE ONLY): 27 min  Charges:  $Gait Training: 8-22 mins $Therapeutic Exercise: 8-22 mins                    Paulino Door, DPT Physical Therapist Acute Rehabilitation Services Office: [phone]    Janan Halter Payson 10/29/2022, 3:22 PM

## 2022-10-29 NOTE — Progress Notes (Signed)
Subjective: 2 Days Post-Op Procedure(s) (LRB): LEFT TOTAL KNEE ARTHROPLASTY (Left) Patient reports pain as moderate.  Her pain is much better since yesterday.  She has been able to mobilize better because pain is under better control.  She wants to discharge to home today.  Objective: Vital signs in last 24 hours: Temp:  [98 F (36.7 C)-99.6 F (37.6 C)] 99.6 F (37.6 C) (05/26 0540) Pulse Rate:  [102-110] 110 (05/26 0540) Resp:  [17-19] 17 (05/26 0540) BP: (132-162)/(67-79) 162/67 (05/26 0540) SpO2:  [92 %-98 %] 97 % (05/26 0540)  Intake/Output from previous day: 05/25 0701 - 05/26 0700 In: 1813.6 [P.O.:780; I.V.:1033.6] Out: 2125 [Urine:2125] Intake/Output this shift: Total I/O In: 223.5 [P.O.:220; I.V.:3.5] Out: 400 [Urine:400]  Recent Labs    10/27/22 1313 10/28/22 0336  HGB 10.9* 12.2    Recent Labs    10/27/22 1313 10/28/22 0336  WBC 3.7* 13.8*  RBC 3.72* 4.11  HCT 35.0* 36.4  PLT 144* 161    Recent Labs    10/27/22 1313 10/28/22 0336  NA 139 131*  K 4.1 3.8  CL 109 99  CO2 24 24  BUN 19 12  CREATININE 0.76 0.79  GLUCOSE 97 131*  CALCIUM 8.2* 8.6*    No results for input(s): "LABPT", "INR" in the last 72 hours.  Sensation intact distally Intact pulses distally Dorsiflexion/Plantar flexion intact Incision: dressing saturated with blood No cellulitis present Compartment soft   Assessment/Plan: 2 Days Post-Op Procedure(s) (LRB): LEFT TOTAL KNEE ARTHROPLASTY (Left) Up with therapy Weight-bear as tolerated PT evaluate and treat Pain control Replaced aquacel this morning Anticipate discharge to home today   London Sheer 10/29/2022, 9:23 AM

## 2022-10-29 NOTE — Discharge Summary (Signed)
Patient ID: Vanessa Christensen MRN: [id number] DOB/AGE: 1951-04-24 72 y.o.  Admit date: 10/27/2022 Discharge date: 10/29/2022  Admission Diagnoses:  Principal Problem:   Status post total left knee replacement Active Problems:   Unilateral primary osteoarthritis, left knee   Discharge Diagnoses:  Same  Past Medical History:  Diagnosis Date   Anemia    hx of  iron deficient   Anxiety    pt denies   Arthritis    hands and feet   Cancer (HCC) 10/04/2006   breast    left   Cataracts, both eyes    Depression    Depression    Diverticulosis of colon    GERD (gastroesophageal reflux disease)    Glaucoma    Hypertension    no meds   Joint pain    Knee pain    Obesity    OSA on CPAP    Osteoarthritis    Sleep apnea    no CPAP- no longer needed d/t weight loss   Thyroid activity decreased     Surgeries: Procedure(s): LEFT TOTAL KNEE ARTHROPLASTY on 10/27/2022   Consultants:   Discharged Condition: Improved  Hospital Course: Vanessa Christensen is an 72 y.o. female who was admitted 10/27/2022 for operative treatment ofStatus post total left knee replacement. Patient has severe unremitting pain that affects sleep, daily activities, and work/hobbies. After pre-op clearance the patient was taken to the operating room on 10/27/2022 and underwent  Procedure(s): LEFT TOTAL KNEE ARTHROPLASTY.    Patient was given perioperative antibiotics:  Anti-infectives (From admission, onward)    Start     Dose/Rate Route Frequency Ordered Stop   10/27/22 1700  ceFAZolin (ANCEF) IVPB 1 g/50 mL premix        1 g 100 mL/hr over 30 Minutes Intravenous Every 6 hours 10/27/22 1418 10/27/22 2308   10/27/22 0845  ceFAZolin (ANCEF) IVPB 2g/100 mL premix        2 g 200 mL/hr over 30 Minutes Intravenous On call to O.R. 10/27/22 0840 10/27/22 1137        Patient was given sequential compression devices, early ambulation, and chemoprophylaxis to prevent DVT.  Patient benefited maximally from hospital stay  and there were no complications.    Recent vital signs: Patient Vitals for the past 24 hrs:  BP Temp Temp src Pulse Resp SpO2  10/29/22 1321 (!) 150/66 98.2 F (36.8 C) Oral 100 18 100 %  10/29/22 0540 (!) 162/67 99.6 F (37.6 C) Oral (!) 110 17 97 %  10/28/22 2218 (!) 150/75 99.1 F (37.3 C) -- (!) 108 19 98 %     Recent laboratory studies:  Recent Labs    10/27/22 1313 10/28/22 0336  WBC 3.7* 13.8*  HGB 10.9* 12.2  HCT 35.0* 36.4  PLT 144* 161  NA 139 131*  K 4.1 3.8  CL 109 99  CO2 24 24  BUN 19 12  CREATININE 0.76 0.79  GLUCOSE 97 131*  CALCIUM 8.2* 8.6*     Discharge Medications:   Allergies as of 10/29/2022       Reactions   Neulasta [pegfilgrastim] Other (See Comments)   Raised lumps on legs, arms - had to be excised =  Happened every time pt received Neulasta.        Medication List     STOP taking these medications    aspirin EC 81 MG tablet Replaced by: aspirin 81 MG chewable tablet       TAKE these medications    aspirin  81 MG chewable tablet Chew 1 tablet (81 mg total) by mouth 2 (two) times daily. Replaces: aspirin EC 81 MG tablet   buPROPion 150 MG 24 hr tablet Commonly known as: Wellbutrin XL Take 2 tablets (300 mg total) by mouth daily.   cetirizine 10 MG tablet Commonly known as: ZYRTEC Take 10 mg by mouth daily.   diphenhydramine-acetaminophen 25-500 MG Tabs tablet Commonly known as: TYLENOL PM Take 2 tablets by mouth at bedtime as needed (sleep).   dorzolamide-timolol 2-0.5 % ophthalmic solution Commonly known as: COSOPT Place 1 drop into both eyes 2 (two) times daily.   FLUoxetine 40 MG capsule Commonly known as: PROZAC TAKE 1 CAPSULE (40 MG TOTAL) BY MOUTH DAILY.   gabapentin 100 MG capsule Commonly known as: NEURONTIN Take 1 capsule (100 mg total) by mouth 3 (three) times daily as needed. For burning type pain   ibuprofen 200 MG tablet Commonly known as: ADVIL Take 400-800 mg by mouth every 6 (six) hours as  needed for moderate pain.   levothyroxine 75 MCG tablet Commonly known as: SYNTHROID TAKE 1 TABLET BY MOUTH EVERY DAY   methocarbamol 500 MG tablet Commonly known as: ROBAXIN Take 1 tablet (500 mg total) by mouth every 6 (six) hours as needed for muscle spasms.   oxyCODONE 5 MG immediate release tablet Commonly known as: Oxy IR/ROXICODONE Take 1-2 tablets (5-10 mg total) by mouth every 4 (four) hours as needed for moderate pain (pain score 4-6).   psyllium 58.6 % powder Commonly known as: METAMUCIL Take 1 packet by mouth every other day.   SUPER B COMPLEX PO Take 1 tablet by mouth daily after supper.   traZODone 50 MG tablet Commonly known as: DESYREL TAKE 1/2 TO 1 TABLET BY MOUTH AT BEDTIME AS NEEDED FOR SLEEP What changed:  how much to take when to take this additional instructions   VITAMIN C PO Take 1 tablet by mouth daily.   Vitamin D 50 MCG (2000 UT) tablet Take 2,000 Units by mouth daily.               Durable Medical Equipment  (From admission, onward)           Start     Ordered   10/27/22 1419  DME 3 n 1  Once        10/27/22 1418   10/27/22 1419  DME Walker rolling  Once       Question Answer Comment  Walker: With 5 Inch Wheels   Patient needs a walker to treat with the following condition Status post total left knee replacement      10/27/22 1418            Diagnostic Studies: DG Knee Left Port  Result Date: 10/27/2022 CLINICAL DATA:  Postop. EXAM: PORTABLE LEFT KNEE - 1-2 VIEW COMPARISON:  None Available. FINDINGS: Left knee arthroplasty in expected alignment. No periprosthetic lucency or fracture. There has been patellar resurfacing. Recent postsurgical change includes air and edema in the soft tissues and joint space. Anterior skin staples in place. IMPRESSION: Left knee arthroplasty without immediate postoperative complication. Electronically Signed   By: Narda Rutherford M.D.   On: 10/27/2022 14:27    Disposition: Discharge  disposition: 01-Home or Self Care          Follow-up Information     [name], MD Follow up in 2 week(s).   Specialty: Orthopedic Surgery Contact information: [address] [place] [address] [phone]  [facility] Follow up.   Specialty: [facility] Contact information: [address] [place] [address] [place] [address] [phone]                  Signed: [name] 10/29/2022, 5:40 PM

## 2022-10-30 DIAGNOSIS — G4733 Obstructive sleep apnea (adult) (pediatric): Secondary | ICD-10-CM | POA: Diagnosis not present

## 2022-10-30 DIAGNOSIS — Z471 Aftercare following joint replacement surgery: Secondary | ICD-10-CM | POA: Diagnosis not present

## 2022-10-30 DIAGNOSIS — Z96652 Presence of left artificial knee joint: Secondary | ICD-10-CM | POA: Diagnosis not present

## 2022-10-30 DIAGNOSIS — Z7982 Long term (current) use of aspirin: Secondary | ICD-10-CM | POA: Diagnosis not present

## 2022-10-30 DIAGNOSIS — Z853 Personal history of malignant neoplasm of breast: Secondary | ICD-10-CM | POA: Diagnosis not present

## 2022-10-30 DIAGNOSIS — Z6837 Body mass index (BMI) 37.0-37.9, adult: Secondary | ICD-10-CM | POA: Diagnosis not present

## 2022-10-30 DIAGNOSIS — F32A Depression, unspecified: Secondary | ICD-10-CM | POA: Diagnosis not present

## 2022-10-30 DIAGNOSIS — N393 Stress incontinence (female) (male): Secondary | ICD-10-CM | POA: Diagnosis not present

## 2022-10-30 DIAGNOSIS — I10 Essential (primary) hypertension: Secondary | ICD-10-CM | POA: Diagnosis not present

## 2022-10-30 DIAGNOSIS — M19042 Primary osteoarthritis, left hand: Secondary | ICD-10-CM | POA: Diagnosis not present

## 2022-10-30 DIAGNOSIS — E039 Hypothyroidism, unspecified: Secondary | ICD-10-CM | POA: Diagnosis not present

## 2022-10-30 DIAGNOSIS — N3281 Overactive bladder: Secondary | ICD-10-CM | POA: Diagnosis not present

## 2022-10-30 DIAGNOSIS — K219 Gastro-esophageal reflux disease without esophagitis: Secondary | ICD-10-CM | POA: Diagnosis not present

## 2022-10-30 DIAGNOSIS — E88819 Insulin resistance, unspecified: Secondary | ICD-10-CM | POA: Diagnosis not present

## 2022-10-30 DIAGNOSIS — M19079 Primary osteoarthritis, unspecified ankle and foot: Secondary | ICD-10-CM | POA: Diagnosis not present

## 2022-10-30 DIAGNOSIS — D509 Iron deficiency anemia, unspecified: Secondary | ICD-10-CM | POA: Diagnosis not present

## 2022-10-30 DIAGNOSIS — R32 Unspecified urinary incontinence: Secondary | ICD-10-CM | POA: Diagnosis not present

## 2022-10-30 DIAGNOSIS — H409 Unspecified glaucoma: Secondary | ICD-10-CM | POA: Diagnosis not present

## 2022-10-30 DIAGNOSIS — Z96651 Presence of right artificial knee joint: Secondary | ICD-10-CM | POA: Diagnosis not present

## 2022-10-30 DIAGNOSIS — K5730 Diverticulosis of large intestine without perforation or abscess without bleeding: Secondary | ICD-10-CM | POA: Diagnosis not present

## 2022-10-30 DIAGNOSIS — E559 Vitamin D deficiency, unspecified: Secondary | ICD-10-CM | POA: Diagnosis not present

## 2022-10-30 DIAGNOSIS — M19041 Primary osteoarthritis, right hand: Secondary | ICD-10-CM | POA: Diagnosis not present

## 2022-10-30 DIAGNOSIS — R413 Other amnesia: Secondary | ICD-10-CM | POA: Diagnosis not present

## 2022-10-30 DIAGNOSIS — F419 Anxiety disorder, unspecified: Secondary | ICD-10-CM | POA: Diagnosis not present

## 2022-10-31 ENCOUNTER — Telehealth: Payer: Self-pay | Admitting: *Deleted

## 2022-10-31 ENCOUNTER — Other Ambulatory Visit: Payer: Self-pay | Admitting: *Deleted

## 2022-10-31 DIAGNOSIS — M1712 Unilateral primary osteoarthritis, left knee: Secondary | ICD-10-CM

## 2022-10-31 DIAGNOSIS — Z96652 Presence of left artificial knee joint: Secondary | ICD-10-CM

## 2022-10-31 NOTE — Telephone Encounter (Signed)
Ortho bundle D/C call completed. 

## 2022-11-02 ENCOUNTER — Encounter (HOSPITAL_COMMUNITY): Payer: Self-pay | Admitting: Orthopaedic Surgery

## 2022-11-02 DIAGNOSIS — M19041 Primary osteoarthritis, right hand: Secondary | ICD-10-CM | POA: Diagnosis not present

## 2022-11-02 DIAGNOSIS — F419 Anxiety disorder, unspecified: Secondary | ICD-10-CM | POA: Diagnosis not present

## 2022-11-02 DIAGNOSIS — M19042 Primary osteoarthritis, left hand: Secondary | ICD-10-CM | POA: Diagnosis not present

## 2022-11-02 DIAGNOSIS — Z471 Aftercare following joint replacement surgery: Secondary | ICD-10-CM | POA: Diagnosis not present

## 2022-11-02 DIAGNOSIS — I10 Essential (primary) hypertension: Secondary | ICD-10-CM | POA: Diagnosis not present

## 2022-11-02 DIAGNOSIS — F32A Depression, unspecified: Secondary | ICD-10-CM | POA: Diagnosis not present

## 2022-11-03 ENCOUNTER — Encounter: Payer: Self-pay | Admitting: Internal Medicine

## 2022-11-03 DIAGNOSIS — M19041 Primary osteoarthritis, right hand: Secondary | ICD-10-CM | POA: Diagnosis not present

## 2022-11-03 DIAGNOSIS — F32A Depression, unspecified: Secondary | ICD-10-CM | POA: Diagnosis not present

## 2022-11-03 DIAGNOSIS — F419 Anxiety disorder, unspecified: Secondary | ICD-10-CM | POA: Diagnosis not present

## 2022-11-03 DIAGNOSIS — M19042 Primary osteoarthritis, left hand: Secondary | ICD-10-CM | POA: Diagnosis not present

## 2022-11-03 DIAGNOSIS — Z471 Aftercare following joint replacement surgery: Secondary | ICD-10-CM | POA: Diagnosis not present

## 2022-11-03 DIAGNOSIS — I10 Essential (primary) hypertension: Secondary | ICD-10-CM | POA: Diagnosis not present

## 2022-11-06 ENCOUNTER — Telehealth: Payer: Self-pay | Admitting: *Deleted

## 2022-11-06 DIAGNOSIS — F32A Depression, unspecified: Secondary | ICD-10-CM | POA: Diagnosis not present

## 2022-11-06 DIAGNOSIS — I10 Essential (primary) hypertension: Secondary | ICD-10-CM | POA: Diagnosis not present

## 2022-11-06 DIAGNOSIS — F419 Anxiety disorder, unspecified: Secondary | ICD-10-CM | POA: Diagnosis not present

## 2022-11-06 DIAGNOSIS — M19042 Primary osteoarthritis, left hand: Secondary | ICD-10-CM | POA: Diagnosis not present

## 2022-11-06 DIAGNOSIS — M19041 Primary osteoarthritis, right hand: Secondary | ICD-10-CM | POA: Diagnosis not present

## 2022-11-06 DIAGNOSIS — Z471 Aftercare following joint replacement surgery: Secondary | ICD-10-CM | POA: Diagnosis not present

## 2022-11-06 NOTE — Telephone Encounter (Signed)
Ortho bundle 7 day call- No new needs.

## 2022-11-08 DIAGNOSIS — F32A Depression, unspecified: Secondary | ICD-10-CM | POA: Diagnosis not present

## 2022-11-08 DIAGNOSIS — M19041 Primary osteoarthritis, right hand: Secondary | ICD-10-CM | POA: Diagnosis not present

## 2022-11-08 DIAGNOSIS — M19042 Primary osteoarthritis, left hand: Secondary | ICD-10-CM | POA: Diagnosis not present

## 2022-11-08 DIAGNOSIS — Z471 Aftercare following joint replacement surgery: Secondary | ICD-10-CM | POA: Diagnosis not present

## 2022-11-08 DIAGNOSIS — F419 Anxiety disorder, unspecified: Secondary | ICD-10-CM | POA: Diagnosis not present

## 2022-11-08 DIAGNOSIS — I10 Essential (primary) hypertension: Secondary | ICD-10-CM | POA: Diagnosis not present

## 2022-11-09 ENCOUNTER — Encounter: Payer: Self-pay | Admitting: Orthopaedic Surgery

## 2022-11-09 ENCOUNTER — Telehealth: Payer: Self-pay | Admitting: *Deleted

## 2022-11-09 ENCOUNTER — Ambulatory Visit (INDEPENDENT_AMBULATORY_CARE_PROVIDER_SITE_OTHER): Payer: Medicare Other | Admitting: Orthopaedic Surgery

## 2022-11-09 DIAGNOSIS — Z96652 Presence of left artificial knee joint: Secondary | ICD-10-CM

## 2022-11-09 MED ORDER — OXYCODONE HCL 5 MG PO TABS: 5.0000 mg | ORAL_TABLET | Freq: Four times a day (QID) | ORAL | 0 refills | Status: DC | PRN

## 2022-11-09 NOTE — Progress Notes (Signed)
Operative visit status post a left total knee arthroplasty.  Notes from physical therapy at home say that her range of motion is 0 to 120 degrees flexion.  That is excellent.  On exam her calf is soft.  Her incision is a little macerated from water getting behind the dressing.  It looks good overall but I feel that it would be better to leave the staples in for several more days.  I placed a new dressing and she will shower every other day and then can change that dressing.  Will see her back next week for staple removal and Steri-Strip placement.  She is starting outpatient physical therapy as well.  I did send in some more oxycodone.

## 2022-11-09 NOTE — Telephone Encounter (Signed)
Ortho bundle 14 day in office appointment completed. 

## 2022-11-10 ENCOUNTER — Telehealth: Payer: Self-pay | Admitting: *Deleted

## 2022-11-10 ENCOUNTER — Ambulatory Visit: Payer: Medicare Other | Admitting: Physical Therapy

## 2022-11-10 ENCOUNTER — Emergency Department (HOSPITAL_COMMUNITY): Payer: Medicare Other

## 2022-11-10 ENCOUNTER — Encounter (HOSPITAL_COMMUNITY): Payer: Self-pay | Admitting: Radiology

## 2022-11-10 ENCOUNTER — Emergency Department (HOSPITAL_COMMUNITY)
Admission: EM | Admit: 2022-11-10 | Discharge: 2022-11-10 | Disposition: A | Discharge status: Home / Self Care | Payer: Medicare Other | Attending: Emergency Medicine | Admitting: Emergency Medicine

## 2022-11-10 ENCOUNTER — Other Ambulatory Visit: Payer: Self-pay

## 2022-11-10 ENCOUNTER — Telehealth: Payer: Self-pay | Admitting: Radiology

## 2022-11-10 DIAGNOSIS — I959 Hypotension, unspecified: Secondary | ICD-10-CM | POA: Diagnosis not present

## 2022-11-10 DIAGNOSIS — R0902 Hypoxemia: Secondary | ICD-10-CM | POA: Diagnosis not present

## 2022-11-10 DIAGNOSIS — I700 Atherosclerosis of aorta: Secondary | ICD-10-CM | POA: Diagnosis not present

## 2022-11-10 DIAGNOSIS — Z7982 Long term (current) use of aspirin: Secondary | ICD-10-CM | POA: Insufficient documentation

## 2022-11-10 DIAGNOSIS — R0600 Dyspnea, unspecified: Secondary | ICD-10-CM | POA: Insufficient documentation

## 2022-11-10 DIAGNOSIS — D649 Anemia, unspecified: Secondary | ICD-10-CM | POA: Insufficient documentation

## 2022-11-10 DIAGNOSIS — R0602 Shortness of breath: Secondary | ICD-10-CM | POA: Diagnosis not present

## 2022-11-10 LAB — CBC
HCT: 26.2 % — ABNORMAL LOW (ref 36.0–46.0)
Hemoglobin: 8 g/dL — ABNORMAL LOW (ref 12.0–15.0)
MCH: 28.3 pg (ref 26.0–34.0)
MCHC: 30.5 g/dL (ref 30.0–36.0)
MCV: 92.6 fL (ref 80.0–100.0)
Platelets: 214 10*3/uL (ref 150–400)
RBC: 2.83 MIL/uL — ABNORMAL LOW (ref 3.87–5.11)
RDW: 15.1 % (ref 11.5–15.5)
WBC: 6.2 10*3/uL (ref 4.0–10.5)
nRBC: 0 % (ref 0.0–0.2)

## 2022-11-10 LAB — I-STAT CHEM 8, ED
BUN: 12 mg/dL (ref 8–23)
Calcium, Ion: 1.21 mmol/L (ref 1.15–1.40)
Chloride: 105 mmol/L (ref 98–111)
Creatinine, Ser: 0.7 mg/dL (ref 0.44–1.00)
Glucose, Bld: 95 mg/dL (ref 70–99)
HCT: 26 % — ABNORMAL LOW (ref 36.0–46.0)
Hemoglobin: 8.8 g/dL — ABNORMAL LOW (ref 12.0–15.0)
Potassium: 3.7 mmol/L (ref 3.5–5.1)
Sodium: 139 mmol/L (ref 135–145)
TCO2: 23 mmol/L (ref 22–32)

## 2022-11-10 LAB — BASIC METABOLIC PANEL
Anion gap: 10 (ref 5–15)
BUN: 14 mg/dL (ref 8–23)
CO2: 22 mmol/L (ref 22–32)
Calcium: 8.9 mg/dL (ref 8.9–10.3)
Chloride: 107 mmol/L (ref 98–111)
Creatinine, Ser: 0.73 mg/dL (ref 0.44–1.00)
GFR, Estimated: >60 mL/min (ref >60)
Glucose, Bld: 98 mg/dL (ref 70–99)
Potassium: 3.9 mmol/L (ref 3.5–5.1)
Sodium: 139 mmol/L (ref 135–145)

## 2022-11-10 MED ORDER — SODIUM CHLORIDE (PF) 0.9 % IJ SOLN
INTRAMUSCULAR | Status: AC
  Filled 2022-11-10: qty 50

## 2022-11-10 MED ORDER — IOHEXOL 350 MG/ML SOLN
100.0000 mL | Freq: Once | INTRAVENOUS | Status: AC | PRN
  Administered 2022-11-10: 100 mL via INTRAVENOUS

## 2022-11-10 NOTE — Telephone Encounter (Signed)
Call to [facility] with [facility] to check patient status after receiving message that patient is having some difficulty and SOB. No answer and left VM, so call to patient's home and Spoke to therapist, who was still there. When she arrived to discharge patient from HHPT, she was having lightheadedness, ringing in the ears, SOB, and O2 sats decreasing with no activity to as low as 79%. BP 144/105. EMS called and patient being taken to ED. Not sure if they are going to [facility] or [facility]. Just FYI.

## 2022-11-10 NOTE — ED Triage Notes (Signed)
Patient brought in from home with c/o SOB. Patient states that she has had SOB since her knee surgery 2 weeks ago, SOB increases with exertion. She seen surgeon yesterday who was concerned about surgical incision, possible infection . L arm restricted due to HX of breast cancer. 130/78 74 24

## 2022-11-10 NOTE — ED Provider Notes (Signed)
[place] EMERGENCY DEPARTMENT AT [facility] Provider Note   CSN: [id number] Arrival date & time: 11/10/22  1130     History  Chief Complaint  Patient presents with   Shortness of Breath    Vanessa Christensen is a 72 y.o. female.  HPI 72 year old female presents today complaining of dyspnea.  Patient had left knee surgery 2 weeks ago.  She reports she has had some dyspnea since then that has been worsening.  She has been up and ambulatory.  She was seen by her orthopedic surgeon yesterday and states that they left the staples in secondary to the did not think it was healing as well as it should.  There is some redness around it but no fever and no discharge and no streaking.  She states pain has been well-controlled.  She denies any significant history of lung disease or COPD.  She has a distant history of smoking.  She had some coughing that is nonproductive.  She has not noted any significant increase in swelling, lateralized swelling, history of DVT, or PE. Patient was transported via EMS they report normal oxygen saturations 97% with normal heart rate and blood pressure     Home Medications Prior to Admission medications   Medication Sig Start Date End Date Taking? Authorizing Provider  Ascorbic Acid (VITAMIN C PO) Take 1 tablet by mouth daily.    [provider]  aspirin 81 MG chewable tablet Chew 1 tablet (81 mg total) by mouth 2 (two) times daily. 10/28/22   [name], MD  B Complex-C (SUPER B COMPLEX PO) Take 1 tablet by mouth daily after supper.    [provider]  buPROPion (WELLBUTRIN XL) 150 MG 24 hr tablet Take 2 tablets (300 mg total) by mouth daily. 07/10/22   Sheliah Hatch, MD  cetirizine (ZYRTEC) 10 MG tablet Take 10 mg by mouth daily.    [provider]  Cholecalciferol (VITAMIN D) 50 MCG (2000 UT) tablet Take 2,000 Units by mouth daily.    [provider]  diphenhydramine-acetaminophen (TYLENOL PM) 25-500  MG TABS tablet Take 2 tablets by mouth at bedtime as needed (sleep).    [provider]  dorzolamide-timolol (COSOPT) 2-0.5 % ophthalmic solution Place 1 drop into both eyes 2 (two) times daily. 03/13/22   [provider]  FLUoxetine (PROZAC) 40 MG capsule TAKE 1 CAPSULE (40 MG TOTAL) BY MOUTH DAILY. 06/20/22   Sheliah Hatch, MD  gabapentin (NEURONTIN) 100 MG capsule Take 1 capsule (100 mg total) by mouth 3 (three) times daily as needed. For burning type pain 10/28/22   [name], MD  ibuprofen (ADVIL) 200 MG tablet Take 400-800 mg by mouth every 6 (six) hours as needed for moderate pain.    [provider]  levothyroxine (SYNTHROID) 75 MCG tablet TAKE 1 TABLET BY MOUTH EVERY DAY 07/19/22   Sheliah Hatch, MD  methocarbamol (ROBAXIN) 500 MG tablet Take 1 tablet (500 mg total) by mouth every 6 (six) hours as needed for muscle spasms. 10/28/22   [name], MD  oxyCODONE (OXY IR/ROXICODONE) 5 MG immediate release tablet Take 1-2 tablets (5-10 mg total) by mouth every 6 (six) hours as needed for moderate pain (pain score 4-6). 11/09/22   [name], MD  psyllium (METAMUCIL) 58.6 % powder Take 1 packet by mouth every other day.    [provider]  traZODone (DESYREL) 50 MG tablet TAKE 1/2 TO 1 TABLET BY MOUTH AT BEDTIME  AS NEEDED FOR SLEEP Patient taking differently: Take 50 mg by mouth at bedtime. 09/15/22   Sheliah Hatch, MD      Allergies    Neulasta [pegfilgrastim]    Review of Systems   Review of Systems  Physical Exam Updated Vital Signs BP 137/64 (BP Location: Right Arm)   Pulse 80   Temp 98.5 F (36.9 C) (Oral)   Resp 16   Ht 1.575 m (5\' 2" )   Wt 93 kg   SpO2 97%   BMI 37.49 kg/m  Physical Exam Vitals reviewed.  Constitutional:      Appearance: Normal appearance.  HENT:     Head: Normocephalic.     Right Ear: External ear normal.     Left Ear: External ear normal.     Nose: Nose normal.      Mouth/Throat:     Pharynx: Oropharynx is clear.  Eyes:     Pupils: Pupils are equal, round, and reactive to light.  Cardiovascular:     Rate and Rhythm: Normal rate and regular rhythm.  Pulmonary:     Effort: Pulmonary effort is normal.     Breath sounds: Normal breath sounds.  Abdominal:     General: Abdomen is flat. Bowel sounds are normal.     Palpations: Abdomen is soft.  Musculoskeletal:     Cervical back: Normal range of motion.     Comments: Left leg with dressing in place Large anterior incision with staples in place there is some erythema spreading from the wound edge but no evidence of fluctuance or discharge Pulses are intact distal Right lower extremity shows no evidence of acute DVT, swelling, or tenderness  Skin:    General: Skin is warm and dry.     Capillary Refill: Capillary refill takes less than 2 seconds.  Neurological:     Mental Status: She is alert.  Psychiatric:        Mood and Affect: Mood normal.     ED Results / Procedures / Treatments   Labs (all labs ordered are listed, but only abnormal results are displayed) Labs Reviewed  CBC - Abnormal; Notable for the following components:      Result Value   RBC 2.83 (*)    Hemoglobin 8.0 (*)    HCT 26.2 (*)    All other components within normal limits  I-STAT CHEM 8, ED - Abnormal; Notable for the following components:   Hemoglobin 8.8 (*)    HCT 26.0 (*)    All other components within normal limits  BASIC METABOLIC PANEL    EKG None  Radiology CT Angio Chest PE W and/or Wo Contrast  Result Date: 11/10/2022 CLINICAL DATA:  Pulmonary embolus EXAM: CT ANGIOGRAPHY CHEST WITH CONTRAST TECHNIQUE: Multidetector CT imaging of the chest was performed using the standard protocol during bolus administration of intravenous contrast. Multiplanar CT image reconstructions and MIPs were obtained to evaluate the vascular anatomy. RADIATION DOSE REDUCTION: This exam was performed according to the departmental  dose-optimization program which includes automated exposure control, adjustment of the mA and/or kV according to patient size and/or use of iterative reconstruction technique. CONTRAST:  OMNIPAQUE IOHEXOL 350 MG/ML SOLN COMPARISON:  None Available. FINDINGS: Cardiovascular: No evidence of pulmonary embolus. Normal heart size. Mitral annular calcifications. Normal caliber thoracic aorta with mild atherosclerotic disease. Mild aortic valve calcifications. Mediastinum/Nodes: Small hiatal hernia. Prior right thyroidectomy. Heterogeneous enlarged left thyroid lobe. No enlarged lymph nodes seen in the chest. Lungs/Pleura: Central airways are patent. Mild  mosaic attenuation, likely due to air trapping. No consolidation, pleural effusion or pneumothorax. Upper Abdomen: Indeterminate left adrenal gland nodule measuring 1.6 cm on series 4, image 120. Musculoskeletal: No chest wall abnormality. No acute or significant osseous findings. Review of the MIP images confirms the above findings. IMPRESSION: 1. No evidence of pulmonary embolus. 2. Mild mosaic attenuation of the lungs, findings can be seen in the setting of small airways disease. 3. Indeterminate left adrenal gland nodule measuring 1.6 cm. Recommend further evaluation with nonemergent adrenal protocol CT. 4. Heterogeneous, enlarged left thyroid lobe. This could be further evaluation with nonemergent thyroid ultrasound is not previously performed. 5. Aortic Atherosclerosis (ICD10-I70.0). Electronically Signed   By: Allegra Lai M.D.   On: 11/10/2022 14:30   DG Chest Port 1 View  Result Date: 11/10/2022 CLINICAL DATA:  Dyspnea EXAM: PORTABLE CHEST 1 VIEW COMPARISON:  None Available. FINDINGS: The heart size and mediastinal contours are within normal limits. No consolidation, pneumothorax or effusion. No edema. Hyperinflation. Surgical clips in the left axillary region. Degenerative changes of the shoulders. The visualized skeletal structures are  unremarkable. IMPRESSION: Hyperinflation.  No acute cardiopulmonary disease. Electronically Signed   By: Karen Kays M.D.   On: 11/10/2022 12:57    Procedures Procedures    Medications Ordered in ED Medications  iohexol (OMNIPAQUE) 350 MG/ML injection 100 mL (100 mLs Intravenous Contrast Given 11/10/22 1404)    ED Course/ Medical Decision Making/ A&P Clinical Course as of 11/10/22 1444  Fri Nov 10, 2022  1320 Chest x-Alvis Pulcini reviewed interpreted and significant for some hyperinflation otherwise no acute abnormality is noted [DR]  1320 CBC is reviewed interpreted and is significant for anemia with hemoglobin of 8 [DR]  1320 First prior hemoglobin is 12 Patient with surgery in the interim [DR]  1320 Basic metabolic panel reviewed interpreted and within normal limits [DR]  1435 CT angio of the lung is reviewed and radiologist interpretation notes no pulmonary embolism, mild mosaic attenuation of the lungs findings can be seen in setting of small airway disease, indeterminate left adrenal gland nodule, heterogenous enlarged left thyroid lobe and aortic atherosclerosis [DR]    Clinical Course User Index [DR] Margarita Grizzle, MD                             Medical Decision Making Amount and/or Complexity of Data Reviewed Labs: ordered. Radiology: ordered.  Risk Prescription drug management.   72 year old female presents today with dyspnea. Differential diagnosis includes but is not limited to lung infection, PE, anemia, other lung etiologies, electrolyte abnormalities Patient will be tested here with chest x-Jamaiyah Pyle, labs, and CTA Work up reveals hgb 8 with first prior of 12- interval surgery, this may be contributing to patient's dyspnea.  Will start iron and advised patient close follow-up No evidence of pe,  " Mild mosaic attenuation of lungs"-unclear significance Patient advised of findings of left adrenal gland nodule and enlarged left thyroid advised to have close follow-up with primary  care for further diagnostics Discussed findings with patient.  She has iron at home and will restart.  We discussed that she needs close follow-up on Monday to have her hemoglobin rechecked. She will return if she is having any worsening symptoms specially fever, cough, or increased shortness of breath or chest pain.         Final Clinical Impression(s) / ED Diagnoses Final diagnoses:  Dyspnea, unspecified type  Anemia, unspecified type    Rx /  DC Orders ED Discharge Orders     None         Margarita Grizzle, MD 11/10/22 1444

## 2022-11-10 NOTE — Discharge Instructions (Signed)
Please restart your iron Have your hemoglobin rechecked on Monday Return to the emergency department if you are worse at any time especially increased shortness of breath, chest pain, fever, or productive cough.

## 2022-11-13 ENCOUNTER — Other Ambulatory Visit (INDEPENDENT_AMBULATORY_CARE_PROVIDER_SITE_OTHER): Payer: Medicare Other

## 2022-11-13 ENCOUNTER — Other Ambulatory Visit: Payer: Self-pay | Admitting: Family Medicine

## 2022-11-13 DIAGNOSIS — D649 Anemia, unspecified: Secondary | ICD-10-CM | POA: Diagnosis not present

## 2022-11-13 LAB — CBC WITH DIFFERENTIAL/PLATELET
Basophils Absolute: 0 10*3/uL (ref 0.0–0.1)
Basophils Relative: 0.7 % (ref 0.0–3.0)
Eosinophils Absolute: 0 10*3/uL (ref 0.0–0.7)
Eosinophils Relative: 0.1 % (ref 0.0–5.0)
HCT: 27.9 % — ABNORMAL LOW (ref 36.0–46.0)
Hemoglobin: 9.1 g/dL — ABNORMAL LOW (ref 12.0–15.0)
Lymphocytes Relative: 28 % (ref 12.0–46.0)
Lymphs Abs: 1.3 10*3/uL (ref 0.7–4.0)
MCHC: 32.6 g/dL (ref 30.0–36.0)
MCV: 86.6 fl (ref 78.0–100.0)
Monocytes Absolute: 0.8 10*3/uL (ref 0.1–1.0)
Monocytes Relative: 17.6 % — ABNORMAL HIGH (ref 3.0–12.0)
Neutro Abs: 2.4 10*3/uL (ref 1.4–7.7)
Neutrophils Relative %: 53.6 % (ref 43.0–77.0)
Platelets: 251 10*3/uL (ref 150.0–400.0)
RBC: 3.22 Mil/uL — ABNORMAL LOW (ref 3.87–5.11)
RDW: 15.9 % — ABNORMAL HIGH (ref 11.5–15.5)
WBC: 4.5 10*3/uL (ref 4.0–10.5)

## 2022-11-13 NOTE — Progress Notes (Signed)
Entered CBC to f/u from ER

## 2022-11-13 NOTE — Therapy (Signed)
OUTPATIENT PHYSICAL THERAPY EVALUATION   Patient Name: Savana Spina MRN: [id number] DOB:[birth year], 72 y.o., female Today's Date: 11/15/2022  END OF SESSION:  PT End of Session - 11/15/22 1600     Visit Number 1    Number of Visits 20    Date for PT Re-Evaluation 01/24/23    Authorization Type Medicare/ BCBS Federal    Progress Note Due on Visit 10    PT Start Time 1605    PT Stop Time 1640    PT Time Calculation (min) 35 min    Activity Tolerance Patient tolerated treatment well    Behavior During Therapy WFL for tasks assessed/performed             Past Medical History:  Diagnosis Date   Anemia    hx of  iron deficient   Anxiety    pt denies   Arthritis    hands and feet   Cancer (HCC) 10/04/2006   breast    left   Cataracts, both eyes    Depression    Depression    Diverticulosis of colon    GERD (gastroesophageal reflux disease)    Glaucoma    Hypertension    no meds   Joint pain    Knee pain    Obesity    OSA on CPAP    Osteoarthritis    Sleep apnea    no CPAP- no longer needed d/t weight loss   Thyroid activity decreased    Past Surgical History:  Procedure Laterality Date   BREAST SURGERY Bilateral    CATARACT EXTRACTION     COLONOSCOPY     GLAUCOMA REPAIR     MASTECTOMY, RADICAL Bilateral    neulasta induced sterile abscesses     THYROIDECTOMY, PARTIAL     TONSILLECTOMY     TOTAL KNEE ARTHROPLASTY Right    TOTAL KNEE ARTHROPLASTY Left 10/27/2022   Procedure: LEFT TOTAL KNEE ARTHROPLASTY;  Surgeon: [name], MD;  Location: [facility];  Service: Orthopedics;  Laterality: Left;   TOTAL KNEE REVISION Right 05/07/2020   Procedure: RIGHT TOTAL KNEE REVISION ARTHROPLASTY;  Surgeon: [name], MD;  Location: [facility];  Service: Orthopedics;  Laterality: Right;   Patient Active Problem List   Diagnosis Date Noted   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   Generalized obesity 10/05/2022   Memory loss 06/12/2022    Essential hypertension 04/13/2022   Morbid obesity (HCC) 04/13/2022   Stress incontinence 11/08/2021   Status post revision of total replacement of right knee 05/07/2020   Failed total knee, right, 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/2020   History of total right knee replacement 01/28/2020   Vitamin D deficiency 08/29/2018   Insulin resistance 08/29/2018   Other specified glaucoma 08/14/2018   Incontinence of urine in female 11/07/2017   Vertigo 11/07/2017   Left knee pain 10/25/2016   Genetic testing 08/08/2016   Hypothyroidism 12/02/2015   Anxiety and depression 12/02/2015   OAB (overactive bladder) 12/02/2015   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/02/2015   BMI 37.0-37.9, adult 12/02/2015    PCP: Sheliah Hatch MD  REFERRING PROVIDER: [name], MD  REFERRING DIAG: [id number] (ICD-10-CM) - Status post total left knee replacement M17.12 (ICD-10-CM) - Unilateral primary osteoarthritis, left knee  THERAPY DIAG:  Chronic pain of left knee  Muscle weakness (generalized)  Difficulty in walking, not elsewhere classified  Localized edema  Rationale for Evaluation and Treatment: Rehabilitation  ONSET DATE: 10/27/2022  surgery  SUBJECTIVE:   SUBJECTIVE STATEMENT: Pt came to clinic s/p Lt TKA on 10/27/2022.  She indicated pain hasn't been too bad.  SPC use about 1 week after surgery.   She indicated some waking at night at times but she was able to sleep.   PERTINENT HISTORY: s/p right knee replacement revsision on 05/07/20. 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 History of Lt breast cancer, depression, GERD, HTN, obesity, OA, sleep apnea   PAIN:  NPRS scale: at worst 5-6/10, at rest 0/10.  Pain location: Lt knee Pain description: tightness, achy Aggravating factors: static postures Relieving factors: heat  PRECAUTIONS: None  WEIGHT BEARING RESTRICTIONS: No  FALLS:  Has patient fallen in last 6 months? 1 fall in last  6 months over dog.   Reported no fear of falling.   LIVING ENVIRONMENT:  Lives in: House/apartment Stairs: stairs to enter with rails on both sides .  Full flight but not to bedroom or normal use areas.  Has following equipment at home: [facility], walker  OCCUPATION: Retired  PLOF: Independent  PATIENT GOALS: Reduce pain, improve walking  OBJECTIVE:   PATIENT SURVEYS:  11/15/2022 FOTO intake: 51   predicted:  61  COGNITION: 11/15/2022 Overall cognitive status: WFL    SENSATION: 11/15/2022 No specific testing.   EDEMA:  11/15/2022 Visible edema in Lt knee, LE distally  MUSCLE LENGTH: 11/15/2022 No specific testing  POSTURE:  11/15/2022 No Significant postural limitations  PALPATION: 11/15/2022 Tenderness Lt quad, incision area.   Steristrips still on.   LOWER EXTREMITY ROM:   ROM Right 11/15/2022 Left 11/15/2022  Hip flexion    Hip extension    Hip abduction    Hip adduction    Hip internal rotation    Hip external rotation    Knee flexion  107 AROM in supine heel slide  Knee extension  -5 AROM in seated LAQ    Ankle dorsiflexion    Ankle plantarflexion    Ankle inversion    Ankle eversion     (Blank rows = not tested)  LOWER EXTREMITY MMT:  MMT Right 11/15/2022 Left 11/15/2022  Hip flexion 5/5 5/5  Hip extension    Hip abduction    Hip adduction    Hip internal rotation    Hip external rotation    Knee flexion 5/5 5/5  Knee extension 5/5 4/5  Ankle dorsiflexion 5/5 4/5  Ankle plantarflexion    Ankle inversion    Ankle eversion     (Blank rows = not tested)  SPECIAL TESTS:  11/15/2022 No specific testing  FUNCTIONAL TESTS:  11/15/2022 18 inch chair transfer: unable without UE assist  Lt SLS: unable on Lt    GAIT: 11/15/2022 SPC in Rt UE with grabbing on to clinician with Lt arm.  Step to to reduced step length patterns.  Maintained knee flexion in stance.  TODAY'S TREATMENT                                                                          DATE:11/15/2022 Therex:    HEP instruction/performance c cues for techniques, handout provided.  Trial set performed of each for comprehension and symptom assessment.  See below for exercise list  Nustep Lvl 5 UE/LE 10 mins for ROM  Vasopneumatic device 10 mins in elevation Lt knee, temp 50 deg (Per pt request), medium compression   PATIENT EDUCATION:  11/15/2022 Education details: HEP, POC Person educated: Patient Education method: Programmer, multimedia, Demonstration, Verbal cues, and Handouts Education comprehension: verbalized understanding, returned demonstration, and verbal cues required  HOME EXERCISE PROGRAM: Access Code: [email or web address] URL: [email or web address] Date: 11/15/2022 Prepared by: Chyrel Masson  Exercises - Supine Heel Slide (Mirrored)  - 3-5 x daily - 7 x weekly - 1 sets - 10 reps - 5 hold - Supine Quadricep Sets  - 3-5 x daily - 7 x weekly - 1 sets - 10 reps - 5 hold - Seated Long Arc Quad (Mirrored)  - 3-5 x daily - 7 x weekly - 1 sets - 5-10 reps - 2 hold - Seated Quad Set (Mirrored)  - 3-5 x daily - 7 x weekly - 1 sets - 10 reps - 5 hold - Sit to Stand  - 3 x daily - 7 x weekly - 1 sets - 10 reps  ASSESSMENT:  CLINICAL IMPRESSION: Patient is a 72 y.o. who comes to clinic with complaints of Lt knee pain s/p recent TKA on 10/27/2022 with mobility, strength and movement coordination deficits that impair their ability to perform usual daily and recreational functional activities without increase difficulty/symptoms at this time.  Patient to benefit from skilled PT services to address impairments and limitations to improve to previous level of function without restriction secondary to condition.   OBJECTIVE IMPAIRMENTS: Abnormal gait, decreased activity tolerance, decreased balance, decreased  coordination, decreased endurance, decreased mobility, difficulty walking, decreased ROM, decreased strength, hypomobility, increased edema, increased fascial restrictions, impaired perceived functional ability, increased muscle spasms, impaired flexibility, improper body mechanics, and pain.   ACTIVITY LIMITATIONS: carrying, lifting, bending, sitting, standing, squatting, sleeping, stairs, transfers, bed mobility, and locomotion level  PARTICIPATION LIMITATIONS: meal prep, cleaning, laundry, interpersonal relationship, driving, shopping, and community activity  PERSONAL FACTORS:  s/p right knee replacement revision on 05/07/20, TKA was 7 years ago. 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  are also affecting patient's functional outcome.   REHAB POTENTIAL: Good  CLINICAL DECISION MAKING: Stable/uncomplicated  EVALUATION COMPLEXITY: Low   GOALS: Goals reviewed with patient? Yes  SHORT TERM GOALS: (target date for Short term goals are 3 weeks 12/06/2022)   1.  Patient will demonstrate independent use of home exercise program to maintain progress from in clinic treatments.  Goal status: New  LONG TERM GOALS: (target dates for all long term goals are 10 weeks  01/24/2023 )   1. Patient will demonstrate/report pain at worst less than or equal to 2/10 to facilitate minimal limitation in daily activity secondary to pain symptoms.  Goal status: New   2. Patient will demonstrate independent use of home exercise program to facilitate ability to maintain/progress functional gains from skilled  physical therapy services.  Goal status: New   3. Patient will demonstrate FOTO outcome > or = 61 % to indicate reduced disability due to condition.  Goal status: New   4.  Patient will demonstrate Lt  LE MMT 5/5 throughout to faciltiate usual transfers, stairs, squatting at [facility] for daily life.   Goal status: New   5.  Patient will demonstrate Lt knee 0-110 AROM to facilitate usual transfers,  ambulation and other mobility of daily life.  Goal status: New   6.  Patient will demonstrate ascending/descending stairs reciprocally s UE assist for community integration.   Goal status: New   7.  Pt will demonstrate independent ambulation community distance > 500 ft s limitation due to symptoms.  Goal Status: New   PLAN:  PT FREQUENCY: 1-2x/week  PT DURATION: 10 weeks  PLANNED INTERVENTIONS: Therapeutic exercises, Therapeutic activity, Neuro Muscular re-education, Balance training, Gait training, Patient/Family education, Joint mobilization, Stair training, DME instructions, Dry Needling, Electrical stimulation, Traction, Cryotherapy, vasopneumatic deviceMoist heat, Taping, Ultrasound, Ionotophoresis 4mg /ml Dexamethasone, and aquatic therapy, Manual therapy.  All included unless contraindicated  PLAN FOR NEXT SESSION: Review HEP knowledge/results.  Progressive strengthening, static balance.    Chyrel Masson, PT, DPT, OCS, ATC 11/15/22  4:38 PM

## 2022-11-14 ENCOUNTER — Telehealth: Payer: Self-pay

## 2022-11-14 NOTE — Telephone Encounter (Signed)
I have made pt aware of lab results

## 2022-11-14 NOTE — Telephone Encounter (Signed)
-----   Message from Sheliah Hatch, MD sent at 11/13/2022  4:31 PM EDT ----- Hemoglobin is better at 9.1  This is good news!

## 2022-11-15 ENCOUNTER — Other Ambulatory Visit: Payer: Self-pay

## 2022-11-15 ENCOUNTER — Encounter: Payer: Self-pay | Admitting: Rehabilitative and Restorative Service Providers"

## 2022-11-15 ENCOUNTER — Ambulatory Visit (INDEPENDENT_AMBULATORY_CARE_PROVIDER_SITE_OTHER): Payer: Medicare Other | Admitting: Orthopaedic Surgery

## 2022-11-15 ENCOUNTER — Ambulatory Visit (INDEPENDENT_AMBULATORY_CARE_PROVIDER_SITE_OTHER): Payer: Medicare Other | Admitting: Rehabilitative and Restorative Service Providers"

## 2022-11-15 ENCOUNTER — Encounter: Payer: Self-pay | Admitting: Orthopaedic Surgery

## 2022-11-15 DIAGNOSIS — M25562 Pain in left knee: Secondary | ICD-10-CM | POA: Diagnosis not present

## 2022-11-15 DIAGNOSIS — M6281 Muscle weakness (generalized): Secondary | ICD-10-CM

## 2022-11-15 DIAGNOSIS — R600 Localized edema: Secondary | ICD-10-CM

## 2022-11-15 DIAGNOSIS — R262 Difficulty in walking, not elsewhere classified: Secondary | ICD-10-CM | POA: Diagnosis not present

## 2022-11-15 DIAGNOSIS — G8929 Other chronic pain: Secondary | ICD-10-CM | POA: Diagnosis not present

## 2022-11-15 DIAGNOSIS — Z96652 Presence of left artificial knee joint: Secondary | ICD-10-CM

## 2022-11-15 NOTE — Progress Notes (Signed)
The patient is here today for first visit 2 weeks status post a left knee replacement.  She is barely taking pain medicine now and therapy has been able to flex her to already 100 degrees with that left knee.  She did have to go to the hospital after surgery and a CT scan did not show any pulmonary embolus.  However she did have some type of nodules around her glands and is being seen for that tomorrow.  Her left knee incision looks good.  The staples have been removed and Steri-Strips applied.  Her calf is soft.  She has excellent range of motion today of the knee.  She is starting outpatient physical therapy I believe this afternoon.  We will see her back in 4 weeks but no x-rays are needed.  If there are issues before then she knows to let us know.

## 2022-11-16 ENCOUNTER — Ambulatory Visit (INDEPENDENT_AMBULATORY_CARE_PROVIDER_SITE_OTHER): Payer: Medicare Other | Admitting: Family Medicine

## 2022-11-16 ENCOUNTER — Encounter: Payer: Self-pay | Admitting: Family Medicine

## 2022-11-16 VITALS — BP 120/80 | HR 82 | Wt 200.0 lb

## 2022-11-16 DIAGNOSIS — E278 Other specified disorders of adrenal gland: Secondary | ICD-10-CM | POA: Insufficient documentation

## 2022-11-16 DIAGNOSIS — F419 Anxiety disorder, unspecified: Secondary | ICD-10-CM

## 2022-11-16 DIAGNOSIS — N3281 Overactive bladder: Secondary | ICD-10-CM | POA: Diagnosis not present

## 2022-11-16 DIAGNOSIS — E279 Disorder of adrenal gland, unspecified: Secondary | ICD-10-CM | POA: Insufficient documentation

## 2022-11-16 DIAGNOSIS — E041 Nontoxic single thyroid nodule: Secondary | ICD-10-CM | POA: Diagnosis not present

## 2022-11-16 DIAGNOSIS — F32A Depression, unspecified: Secondary | ICD-10-CM | POA: Diagnosis not present

## 2022-11-16 DIAGNOSIS — R948 Abnormal results of function studies of other organs and systems: Secondary | ICD-10-CM

## 2022-11-16 MED ORDER — MIRABEGRON ER 25 MG PO TB24: 25.0000 mg | ORAL_TABLET | Freq: Every day | ORAL | 3 refills | Status: DC

## 2022-11-16 MED ORDER — CLONAZEPAM 0.5 MG PO TABS: 0.5000 mg | ORAL_TABLET | Freq: Two times a day (BID) | ORAL | 1 refills | Status: DC | PRN

## 2022-11-16 NOTE — Progress Notes (Signed)
   Subjective:    Patient ID: Vanessa Christensen, female    DOB: 14-May-1951, 72 y.o.   MRN: [id number]  HPI Anxiety- pt feels that anxiety has been high since knee surgery.  Thinks this is contributing to her SOB.  Feels that head is 'fuzzy'.  Pt was very stressed yesterday at the thought of having stitches removed.  Currently on Fluoxetine 40mg  daily, Wellbutrin XL 300mg  daily, Trazodone nightly.  Pt reports 'my sleeping is off'.    OAB- stopped Vesicare in January due to confusion and depression.  Since then has struggled w/ incontinence.  Has decreased water intake to try and circumvent this problem.    Adrenal gland nodule- L sided found incidentally on CTA done 6/7.  Adrenal protocol CT recommended  Thyroid nodule- L sided, found incidentally on CTA.  Recommended thyroid US.   Review of Systems For ROS see HPI     Objective:   Physical Exam Constitutional:      General: She is not in acute distress.    Appearance: Normal appearance. She is well-developed. She is not ill-appearing.  HENT:     Head: Normocephalic and atraumatic.  Eyes:     Conjunctiva/sclera: Conjunctivae normal.     Pupils: Pupils are equal, round, and reactive to light.  Neck:     Thyroid: No thyromegaly.  Cardiovascular:     Rate and Rhythm: Normal rate and regular rhythm.     Heart sounds: Normal heart sounds. No murmur heard. Pulmonary:     Effort: Pulmonary effort is normal. No respiratory distress.     Breath sounds: Normal breath sounds.  Abdominal:     General: There is no distension.     Palpations: Abdomen is soft.     Tenderness: There is no abdominal tenderness.  Musculoskeletal:     Cervical back: Normal range of motion and neck supple.  Lymphadenopathy:     Cervical: No cervical adenopathy.  Skin:    General: Skin is warm and dry.  Neurological:     Mental Status: She is alert and oriented to person, place, and time.  Psychiatric:        Behavior: Behavior normal.     Comments: anxious            Assessment & Plan:

## 2022-11-16 NOTE — Patient Instructions (Signed)
Follow up as needed or as scheduled START the Clonazepam as needed for high stress/high panicked moments TAKE the Myrbetriq daily for overactive bladder We'll call you to schedule your thyroid ultrasound and adrenal CT Call with any questions or concerns Stay Safe!  Stay Healthy! Hang in there!  You can do this!!!

## 2022-11-16 NOTE — Therapy (Signed)
OUTPATIENT PHYSICAL THERAPY TREATMENT NOTE    Patient Name: Clintonia Cartaya MRN: [id number] DOB:[birth year], 72 y.o., female Today's Date: 11/17/2022  END OF SESSION:  PT End of Session - 11/17/22 1358     Visit Number 2    Number of Visits 20    Date for PT Re-Evaluation 01/24/23    Authorization Type Medicare/ BCBS Federal    Progress Note Due on Visit 10    PT Start Time 1359   late check in   PT Stop Time 1425    PT Time Calculation (min) 26 min    Activity Tolerance Patient tolerated treatment well;No increased pain    Behavior During Therapy WFL for tasks assessed/performed              Past Medical History:  Diagnosis Date   Anemia    hx of  iron deficient   Anxiety    pt denies   Arthritis    hands and feet   Cancer (HCC) 10/04/2006   breast    left   Cataracts, both eyes    Depression    Depression    Diverticulosis of colon    GERD (gastroesophageal reflux disease)    Glaucoma    Hypertension    no meds   Joint pain    Knee pain    Obesity    OSA on CPAP    Osteoarthritis    Sleep apnea    no CPAP- no longer needed d/t weight loss   Thyroid activity decreased    Past Surgical History:  Procedure Laterality Date   BREAST SURGERY Bilateral    CATARACT EXTRACTION     COLONOSCOPY     GLAUCOMA REPAIR     MASTECTOMY, RADICAL Bilateral    neulasta induced sterile abscesses     THYROIDECTOMY, PARTIAL     TONSILLECTOMY     TOTAL KNEE ARTHROPLASTY Right    TOTAL KNEE ARTHROPLASTY Left 10/27/2022   Procedure: LEFT TOTAL KNEE ARTHROPLASTY;  Surgeon: [name], MD;  Location: [facility];  Service: Orthopedics;  Laterality: Left;   TOTAL KNEE REVISION Right 05/07/2020   Procedure: RIGHT TOTAL KNEE REVISION ARTHROPLASTY;  Surgeon: [name], MD;  Location: [facility];  Service: Orthopedics;  Laterality: Right;   Patient Active Problem List   Diagnosis Date Noted   Adrenal nodule (HCC) 11/16/2022   Thyroid nodule 11/16/2022   Status  post total left knee replacement 10/27/2022   Generalized obesity 10/05/2022   Memory loss 06/12/2022   Essential hypertension 04/13/2022   Morbid obesity (HCC) 04/13/2022   Stress incontinence 11/08/2021   Status post revision of total replacement of right knee 05/07/2020   Failed total knee, right, 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/2020   History of total right knee replacement 01/28/2020   Vitamin D deficiency 08/29/2018   Insulin resistance 08/29/2018   Other specified glaucoma 08/14/2018   Incontinence of urine in female 11/07/2017   Vertigo 11/07/2017   Left knee pain 10/25/2016   Genetic testing 08/08/2016   Hypothyroidism 12/02/2015   Anxiety and depression 12/02/2015   OAB (overactive bladder) 12/02/2015   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/02/2015   BMI 37.0-37.9, adult 12/02/2015    PCP: Sheliah Hatch MD  REFERRING PROVIDER: [name], MD  REFERRING DIAG: [phone] (ICD-10-CM) - Status post total left knee replacement M17.12 (ICD-10-CM) - Unilateral primary osteoarthritis, left knee  THERAPY DIAG:  Chronic pain of left knee  Muscle weakness (generalized)  Difficulty in walking, not elsewhere classified  Localized edema  Rationale for Evaluation and Treatment: Rehabilitation  ONSET DATE: 10/27/2022 surgery  SUBJECTIVE:   SUBJECTIVE STATEMENT: Pt states she is feeling pretty good today, no pain at present. Comes into clinic with SPC. HEP going well although she states she did overdo it a bit but better today.   PERTINENT HISTORY: s/p right knee replacement revsision on 05/07/20. 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 History of Lt breast cancer, depression, GERD, HTN, obesity, OA, sleep apnea   PAIN:  NPRS scale: at worst 5-6/10, at rest 0/10.  Pain location: Lt knee Pain description: tightness, achy Aggravating factors: static postures Relieving factors: heat  PRECAUTIONS: None  WEIGHT BEARING  RESTRICTIONS: No  FALLS:  Has patient fallen in last 6 months? 1 fall in last 6 months over dog.   Reported no fear of falling.   LIVING ENVIRONMENT:  Lives in: House/apartment Stairs: stairs to enter with rails on both sides .  Full flight but not to bedroom or normal use areas.  Has following equipment at home: [facility], walker  OCCUPATION: Retired  PLOF: Independent  PATIENT GOALS: Reduce pain, improve walking  OBJECTIVE: (objective measures completed at initial evaluation unless otherwise dated)   PATIENT SURVEYS:  11/15/2022 FOTO intake: 51   predicted:  61  COGNITION: 11/15/2022 Overall cognitive status: WFL    SENSATION: 11/15/2022 No specific testing.   EDEMA:  11/15/2022 Visible edema in Lt knee, LE distally  MUSCLE LENGTH: 11/15/2022 No specific testing  POSTURE:  11/15/2022 No Significant postural limitations  PALPATION: 11/15/2022 Tenderness Lt quad, incision area.   Steristrips still on.   LOWER EXTREMITY ROM:   ROM Right 11/15/2022 Left 11/15/2022  Hip flexion    Hip extension    Hip abduction    Hip adduction    Hip internal rotation    Hip external rotation    Knee flexion  107 AROM in supine heel slide  Knee extension  -5 AROM in seated LAQ    Ankle dorsiflexion    Ankle plantarflexion    Ankle inversion    Ankle eversion     (Blank rows = not tested)  LOWER EXTREMITY MMT:  MMT Right 11/15/2022 Left 11/15/2022  Hip flexion 5/5 5/5  Hip extension    Hip abduction    Hip adduction    Hip internal rotation    Hip external rotation    Knee flexion 5/5 5/5  Knee extension 5/5 4/5  Ankle dorsiflexion 5/5 4/5  Ankle plantarflexion    Ankle inversion    Ankle eversion     (Blank rows = not tested)  SPECIAL TESTS:  11/15/2022 No specific testing  FUNCTIONAL TESTS:  11/15/2022 18 inch chair transfer: unable without UE assist  Lt SLS: unable on Lt    GAIT: 11/15/2022 SPC in Rt UE with grabbing on to clinician with Lt arm.  Step to  to reduced step length patterns.  Maintained knee flexion in stance.     TODAY'S TREATMENT                                                              [facility] Adult PT Treatment:  DATE: 11/17/22 Therapeutic Exercise: Supine heel slide 2x10 w strap and pillow  Supine quad set 2x10 with heel prop cues for reduced hip compensations LAQ x20 cues for pacing STS x10 from slightly raised mat, no UE support 6 inch step weight shift 3x8 in // bars cues for comfortable ROM and mechanics  HEP review + education    DATE:11/15/2022 Therex:    HEP instruction/performance c cues for techniques, handout provided.  Trial set performed of each for comprehension and symptom assessment.  See below for exercise list  Nustep Lvl 5 UE/LE 10 mins for ROM  Vasopneumatic device 10 mins in elevation Lt knee, temp 50 deg (Per pt request), medium compression   PATIENT EDUCATION:  11/17/2022  Education details: rationale for interventions, HEP  Person educated: Patient Education method: Programmer, multimedia, Demonstration, Verbal cues, and Handouts Education comprehension: verbalized understanding, returned demonstration, and verbal cues required  HOME EXERCISE PROGRAM: Access Code: [email or web address] URL: [email or web address] Date: 11/15/2022 Prepared by: Chyrel Masson  Exercises - Supine Heel Slide (Mirrored)  - 3-5 x daily - 7 x weekly - 1 sets - 10 reps - 5 hold - Supine Quadricep Sets  - 3-5 x daily - 7 x weekly - 1 sets - 10 reps - 5 hold - Seated Long Arc Quad (Mirrored)  - 3-5 x daily - 7 x weekly - 1 sets - 5-10 reps - 2 hold - Seated Quad Set (Mirrored)  - 3-5 x daily - 7 x weekly - 1 sets - 10 reps - 5 hold - Sit to Stand  - 3 x daily - 7 x weekly - 1 sets - 10 reps  ASSESSMENT:  CLINICAL IMPRESSION: 11/17/2022 Pt arrives w/o pain, continues to demo post surgical deficits as expected but overall doing well. Today's session is shortened due to late  arrival but pt is able to tolerate progression for closed chain mobility/strengthening without issue, report of improved stiffness as session goes on. Cues as above, no adverse events, departs without pain. Recommend continuing along current POC in order to address relevant deficits and improve functional tolerance. Pt departs today's session in no acute distress, all voiced questions/concerns addressed appropriately from PT perspective.     Per eval - Patient is a 72 y.o. who comes to clinic with complaints of Lt knee pain s/p recent TKA on 10/27/2022 with mobility, strength and movement coordination deficits that impair their ability to perform usual daily and recreational functional activities without increase difficulty/symptoms at this time.  Patient to benefit from skilled PT services to address impairments and limitations to improve to previous level of function without restriction secondary to condition.   OBJECTIVE IMPAIRMENTS: Abnormal gait, decreased activity tolerance, decreased balance, decreased coordination, decreased endurance, decreased mobility, difficulty walking, decreased ROM, decreased strength, hypomobility, increased edema, increased fascial restrictions, impaired perceived functional ability, increased muscle spasms, impaired flexibility, improper body mechanics, and pain.   ACTIVITY LIMITATIONS: carrying, lifting, bending, sitting, standing, squatting, sleeping, stairs, transfers, bed mobility, and locomotion level  PARTICIPATION LIMITATIONS: meal prep, cleaning, laundry, interpersonal relationship, driving, shopping, and community activity  PERSONAL FACTORS:  s/p right knee replacement revision on 05/07/20, TKA was 7 years ago. 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  are also affecting patient's functional outcome.   REHAB POTENTIAL: Good  CLINICAL DECISION MAKING: Stable/uncomplicated  EVALUATION COMPLEXITY: Low   GOALS: Goals reviewed with patient? Yes  SHORT  TERM GOALS: (target date for Short term goals are 3 weeks 12/06/2022)   1.  Patient will demonstrate  independent use of home exercise program to maintain progress from in clinic treatments.  Goal status: New  LONG TERM GOALS: (target dates for all long term goals are 10 weeks  01/24/2023 )   1. Patient will demonstrate/report pain at worst less than or equal to 2/10 to facilitate minimal limitation in daily activity secondary to pain symptoms.  Goal status: New   2. Patient will demonstrate independent use of home exercise program to facilitate ability to maintain/progress functional gains from skilled physical therapy services.  Goal status: New   3. Patient will demonstrate FOTO outcome > or = 61 % to indicate reduced disability due to condition.  Goal status: New   4.  Patient will demonstrate Lt  LE MMT 5/5 throughout to faciltiate usual transfers, stairs, squatting at [facility] for daily life.   Goal status: New   5.  Patient will demonstrate Lt knee 0-110 AROM to facilitate usual transfers, ambulation and other mobility of daily life.  Goal status: New   6.  Patient will demonstrate ascending/descending stairs reciprocally s UE assist for community integration.   Goal status: New   7.  Pt will demonstrate independent ambulation community distance > 500 ft s limitation due to symptoms.  Goal Status: New   PLAN:  PT FREQUENCY: 1-2x/week  PT DURATION: 10 weeks  PLANNED INTERVENTIONS: Therapeutic exercises, Therapeutic activity, Neuro Muscular re-education, Balance training, Gait training, Patient/Family education, Joint mobilization, Stair training, DME instructions, Dry Needling, Electrical stimulation, Traction, Cryotherapy, vasopneumatic deviceMoist heat, Taping, Ultrasound, Ionotophoresis 4mg /ml Dexamethasone, and aquatic therapy, Manual therapy.  All included unless contraindicated  PLAN FOR NEXT SESSION: review/update HEP PRN. Continue to work on knee mobility and  functional mobility    Ashley Murrain PT, DPT 11/17/2022 4:08 PM

## 2022-11-17 ENCOUNTER — Ambulatory Visit (INDEPENDENT_AMBULATORY_CARE_PROVIDER_SITE_OTHER): Payer: Medicare Other | Admitting: Physical Therapy

## 2022-11-17 ENCOUNTER — Encounter: Payer: Self-pay | Admitting: Physical Therapy

## 2022-11-17 DIAGNOSIS — M6281 Muscle weakness (generalized): Secondary | ICD-10-CM | POA: Diagnosis not present

## 2022-11-17 DIAGNOSIS — M25562 Pain in left knee: Secondary | ICD-10-CM

## 2022-11-17 DIAGNOSIS — G8929 Other chronic pain: Secondary | ICD-10-CM | POA: Diagnosis not present

## 2022-11-17 DIAGNOSIS — R600 Localized edema: Secondary | ICD-10-CM | POA: Diagnosis not present

## 2022-11-17 DIAGNOSIS — R262 Difficulty in walking, not elsewhere classified: Secondary | ICD-10-CM | POA: Diagnosis not present

## 2022-11-21 ENCOUNTER — Ambulatory Visit (INDEPENDENT_AMBULATORY_CARE_PROVIDER_SITE_OTHER): Payer: Medicare Other | Admitting: Physical Therapy

## 2022-11-21 ENCOUNTER — Encounter: Payer: Self-pay | Admitting: Bariatrics

## 2022-11-21 ENCOUNTER — Ambulatory Visit (INDEPENDENT_AMBULATORY_CARE_PROVIDER_SITE_OTHER): Payer: Medicare Other | Admitting: Bariatrics

## 2022-11-21 ENCOUNTER — Encounter: Payer: Self-pay | Admitting: Physical Therapy

## 2022-11-21 VITALS — BP 122/62 | HR 91 | Temp 98.0°F | Ht 62.0 in | Wt 196.0 lb

## 2022-11-21 DIAGNOSIS — E88819 Insulin resistance, unspecified: Secondary | ICD-10-CM

## 2022-11-21 DIAGNOSIS — R262 Difficulty in walking, not elsewhere classified: Secondary | ICD-10-CM

## 2022-11-21 DIAGNOSIS — G8929 Other chronic pain: Secondary | ICD-10-CM

## 2022-11-21 DIAGNOSIS — M25562 Pain in left knee: Secondary | ICD-10-CM | POA: Diagnosis not present

## 2022-11-21 DIAGNOSIS — Z6835 Body mass index (BMI) 35.0-35.9, adult: Secondary | ICD-10-CM

## 2022-11-21 DIAGNOSIS — M6281 Muscle weakness (generalized): Secondary | ICD-10-CM

## 2022-11-21 DIAGNOSIS — E669 Obesity, unspecified: Secondary | ICD-10-CM | POA: Diagnosis not present

## 2022-11-21 DIAGNOSIS — R600 Localized edema: Secondary | ICD-10-CM

## 2022-11-21 NOTE — Therapy (Signed)
OUTPATIENT PHYSICAL THERAPY TREATMENT NOTE    Patient Name: Vanessa Christensen MRN: [id number] DOB:1950-08-10, 73 y.o., female Today's Date: 11/21/2022  END OF SESSION:  PT End of Session - 11/21/22 1145     Visit Number 3    Number of Visits 20    Date for PT Re-Evaluation 01/24/23    Authorization Type Medicare/ BCBS Federal    Progress Note Due on Visit 10    PT Start Time 1140    PT Stop Time 1225    PT Time Calculation (min) 45 min    Activity Tolerance Patient tolerated treatment well;No increased pain    Behavior During Therapy WFL for tasks assessed/performed              Past Medical History:  Diagnosis Date   Anemia    hx of  iron deficient   Anxiety    pt denies   Arthritis    hands and feet   Cancer (HCC) 10/04/2006   breast    left   Cataracts, both eyes    Depression    Depression    Diverticulosis of colon    GERD (gastroesophageal reflux disease)    Glaucoma    Hypertension    no meds   Joint pain    Knee pain    Obesity    OSA on CPAP    Osteoarthritis    Sleep apnea    no CPAP- no longer needed d/t weight loss   Thyroid activity decreased    Past Surgical History:  Procedure Laterality Date   BREAST SURGERY Bilateral    CATARACT EXTRACTION     COLONOSCOPY     GLAUCOMA REPAIR     MASTECTOMY, RADICAL Bilateral    neulasta induced sterile abscesses     THYROIDECTOMY, PARTIAL     TONSILLECTOMY     TOTAL KNEE ARTHROPLASTY Right    TOTAL KNEE ARTHROPLASTY Left 10/27/2022   Procedure: LEFT TOTAL KNEE ARTHROPLASTY;  Surgeon: [name], MD;  Location: [facility];  Service: Orthopedics;  Laterality: Left;   TOTAL KNEE REVISION Right 05/07/2020   Procedure: RIGHT TOTAL KNEE REVISION ARTHROPLASTY;  Surgeon: [name], MD;  Location: [facility];  Service: Orthopedics;  Laterality: Right;   Patient Active Problem List   Diagnosis Date Noted   Adrenal nodule (HCC) 11/16/2022   Thyroid nodule 11/16/2022   Status post total left  knee replacement 10/27/2022   Generalized obesity 10/05/2022   Memory loss 06/12/2022   Essential hypertension 04/13/2022   Morbid obesity (HCC) 04/13/2022   Stress incontinence 11/08/2021   Status post revision of total replacement of right knee 05/07/2020   Failed total knee, right, 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/2020   History of total right knee replacement 01/28/2020   Vitamin D deficiency 08/29/2018   Insulin resistance 08/29/2018   Other specified glaucoma 08/14/2018   Incontinence of urine in female 11/07/2017   Vertigo 11/07/2017   Left knee pain 10/25/2016   Genetic testing 08/08/2016   Hypothyroidism 12/02/2015   Anxiety and depression 12/02/2015   OAB (overactive bladder) 12/02/2015   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/02/2015   BMI 37.0-37.9, adult 12/02/2015    PCP: Sheliah Hatch MD  REFERRING PROVIDER: [name], MD  REFERRING DIAG: [phone] (ICD-10-CM) - Status post total left knee replacement M17.12 (ICD-10-CM) - Unilateral primary osteoarthritis, left knee  THERAPY DIAG:  Chronic pain of left knee  Muscle weakness (generalized)  Difficulty in walking, not  elsewhere classified  Localized edema  Difficulty walking  Rationale for Evaluation and Treatment: Rehabilitation  ONSET DATE: 10/27/2022 surgery  SUBJECTIVE:   SUBJECTIVE STATEMENT: Pt states she is having more overall pain today in her left knee.   PERTINENT HISTORY: s/p right knee replacement revsision on 05/07/20. 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 History of Lt breast cancer, depression, GERD, HTN, obesity, OA, sleep apnea   PAIN:  NPRS scale: 6/10 upon arrival to PT   Pain location: Lt knee Pain description: tightness, achy Aggravating factors: static postures Relieving factors: heat  PRECAUTIONS: None  WEIGHT BEARING RESTRICTIONS: No  FALLS:  Has patient fallen in last 6 months? 1 fall in last 6 months over dog.    Reported no fear of falling.   LIVING ENVIRONMENT:  Lives in: House/apartment Stairs: stairs to enter with rails on both sides .  Full flight but not to bedroom or normal use areas.  Has following equipment at home: [facility], walker  OCCUPATION: Retired  PLOF: Independent  PATIENT GOALS: Reduce pain, improve walking  OBJECTIVE: (objective measures completed at initial evaluation unless otherwise dated)   PATIENT SURVEYS:  11/15/2022 FOTO intake: 51   predicted:  61  COGNITION: 11/15/2022 Overall cognitive status: WFL    SENSATION: 11/15/2022 No specific testing.   EDEMA:  11/15/2022 Visible edema in Lt knee, LE distally  MUSCLE LENGTH: 11/15/2022 No specific testing  POSTURE:  11/15/2022 No Significant postural limitations  PALPATION: 11/15/2022 Tenderness Lt quad, incision area.   Steristrips still on.   LOWER EXTREMITY ROM:   ROM Right 11/15/2022 Left 11/15/2022  Hip flexion    Hip extension    Hip abduction    Hip adduction    Hip internal rotation    Hip external rotation    Knee flexion  107 AROM in supine heel slide  Knee extension  -5 AROM in seated LAQ    Ankle dorsiflexion    Ankle plantarflexion    Ankle inversion    Ankle eversion     (Blank rows = not tested)  LOWER EXTREMITY MMT:  MMT Right 11/15/2022 Left 11/15/2022  Hip flexion 5/5 5/5  Hip extension    Hip abduction    Hip adduction    Hip internal rotation    Hip external rotation    Knee flexion 5/5 5/5  Knee extension 5/5 4/5  Ankle dorsiflexion 5/5 4/5  Ankle plantarflexion    Ankle inversion    Ankle eversion     (Blank rows = not tested)  SPECIAL TESTS:  11/15/2022 No specific testing  FUNCTIONAL TESTS:  11/15/2022 18 inch chair transfer: unable without UE assist  Lt SLS: unable on Lt    GAIT: 11/15/2022 SPC in Rt UE with grabbing on to clinician with Lt arm.  Step to to reduced step length patterns.  Maintained knee flexion in stance.     TODAY'S TREATMENT                                                               11/21/22 Scit fit bike seat # 9 X 8 min Slantboard stretch 30 sec X 3 Heel and toe raises X 15 bilat Forward step ups 6 inch step with one UE support X 10 bilat Seated Lt LAQ 2# 2X15  Seated SLR 2X10 bilat Sit to stand with airex pad in chair 2X5, no UE support Manual therapy for Left knee PROM with overpressure into flexion and extension to tolerance Leg press machine DL 78# 2N56, then SL 21# 3Y86 each leg She declined ice or vaso, says it makes her hurt    [facility] Adult PT Treatment:                                                DATE: 11/17/22 Therapeutic Exercise: Supine heel slide 2x10 w strap and pillow  Supine quad set 2x10 with heel prop cues for reduced hip compensations LAQ x20 cues for pacing STS x10 from slightly raised mat, no UE support 6 inch step weight shift 3x8 in // bars cues for comfortable ROM and mechanics  HEP review + education    DATE:11/15/2022 Therex:    HEP instruction/performance c cues for techniques, handout provided.  Trial set performed of each for comprehension and symptom assessment.  See below for exercise list  Nustep Lvl 5 UE/LE 10 mins for ROM  Vasopneumatic device 10 mins in elevation Lt knee, temp 50 deg (Per pt request), medium compression   PATIENT EDUCATION:  11/21/2022  Education details: rationale for interventions, HEP  Person educated: Patient Education method: Programmer, multimedia, Demonstration, Verbal cues, and Handouts Education comprehension: verbalized understanding, returned demonstration, and verbal cues required  HOME EXERCISE PROGRAM: Access Code: [email or web address] URL: [email or web address] Date: 11/15/2022 Prepared by: Chyrel Masson  Exercises - Supine Heel Slide (Mirrored)  - 3-5 x daily - 7 x weekly - 1 sets - 10 reps - 5 hold - Supine Quadricep Sets  - 3-5 x daily - 7 x weekly - 1 sets - 10 reps - 5 hold - Seated Long Arc Quad (Mirrored)  - 3-5 x daily - 7 x  weekly - 1 sets - 5-10 reps - 2 hold - Seated Quad Set (Mirrored)  - 3-5 x daily - 7 x weekly - 1 sets - 10 reps - 5 hold - Sit to Stand  - 3 x daily - 7 x weekly - 1 sets - 10 reps  ASSESSMENT:  CLINICAL IMPRESSION: She arrives with more overall knee pain however she had good tolerance to exercise progressions without complaints of increased pain. Overall her ROM is doing very well up to this point and will need continued strength work to improve function.   Per eval - Patient is a 72 y.o. who comes to clinic with complaints of Lt knee pain s/p recent TKA on 10/27/2022 with mobility, strength and movement coordination deficits that impair their ability to perform usual daily and recreational functional activities without increase difficulty/symptoms at this time.  Patient to benefit from skilled PT services to address impairments and limitations to improve to previous level of function without restriction secondary to condition.   OBJECTIVE IMPAIRMENTS: Abnormal gait, decreased activity tolerance, decreased balance, decreased coordination, decreased endurance, decreased mobility, difficulty walking, decreased ROM, decreased strength, hypomobility, increased edema, increased fascial restrictions, impaired perceived functional ability, increased muscle spasms, impaired flexibility, improper body mechanics, and pain.   ACTIVITY LIMITATIONS: carrying, lifting, bending, sitting, standing, squatting, sleeping, stairs, transfers, bed mobility, and locomotion level  PARTICIPATION LIMITATIONS: meal prep, cleaning, laundry, interpersonal relationship, driving, shopping, and community activity  PERSONAL FACTORS:  s/p right knee replacement revision on 05/07/20, TKA was 7 years ago. She  has chronic neuropathy in her right foot.  are also affecting patient's functional outcome.   REHAB POTENTIAL: Good  CLINICAL DECISION MAKING: Stable/uncomplicated  EVALUATION COMPLEXITY: Low   GOALS: Goals reviewed  with patient? Yes  SHORT TERM GOALS: (target date for Short term goals are 3 weeks 12/06/2022)   1.  Patient will demonstrate independent use of home exercise program to maintain progress from in clinic treatments.  Goal status: New  LONG TERM GOALS: (target dates for all long term goals are 10 weeks  01/24/2023 )   1. Patient will demonstrate/report pain at worst less than or equal to 2/10 to facilitate minimal limitation in daily activity secondary to pain symptoms.  Goal status: New   2. Patient will demonstrate independent use of home exercise program to facilitate ability to maintain/progress functional gains from skilled physical therapy services.  Goal status: New   3. Patient will demonstrate FOTO outcome > or = 61 % to indicate reduced disability due to condition.  Goal status: New   4.  Patient will demonstrate Lt  LE MMT 5/5 throughout to faciltiate usual transfers, stairs, squatting at [employer] for daily life.   Goal status: New   5.  Patient will demonstrate Lt knee 0-110 AROM to facilitate usual transfers, ambulation and other mobility of daily life.  Goal status: New   6.  Patient will demonstrate ascending/descending stairs reciprocally s UE assist for community integration.   Goal status: New   7.  Pt will demonstrate independent ambulation community distance > 500 ft s limitation due to symptoms.  Goal Status: New   PLAN:  PT FREQUENCY: 1-2x/week  PT DURATION: 10 weeks  PLANNED INTERVENTIONS: Therapeutic exercises, Therapeutic activity, Neuro Muscular re-education, Balance training, Gait training, Patient/Family education, Joint mobilization, Stair training, DME instructions, Dry Needling, Electrical stimulation, Traction, Cryotherapy, vasopneumatic deviceMoist heat, Taping, Ultrasound, Ionotophoresis 4mg /ml Dexamethasone, and aquatic therapy, Manual therapy.  All included unless contraindicated  PLAN FOR NEXT SESSION: Strength, ROM, and balance progressions as  tolerated.  Ivery Quale, PT, DPT 11/21/22 11:46 AM

## 2022-11-21 NOTE — Progress Notes (Signed)
   WEIGHT SUMMARY AND BIOMETRICS  Weight Lost Since Last Visit: 10lb   Vitals Temp: 98 F (36.7 C) BP: 122/62 Pulse Rate: 91 SpO2: 99 %   Anthropometric Measurements Height: 5\' 2"  (1.575 m) Weight: 196 lb (88.9 kg) BMI (Calculated): 35.84 Weight at Last Visit: 206lb Weight Lost Since Last Visit: 10lb Starting Weight: 241lb Total Weight Loss (lbs): 45 lb (20.4 kg)   Body Composition  Body Fat %: 46 % Fat Mass (lbs): 90.2 lbs Muscle Mass (lbs): 100.6 lbs Total Body Water (lbs): 77.6 lbs Visceral Fat Rating : 15   Other Clinical Data Fasting: no Labs: no Today's Visit #: 26 Starting Date: 08/13/18    OBESITY Rustina is here to discuss her progress with her obesity treatment plan along with follow-up of her obesity related diagnoses.     Nutrition Plan: the Category 3 plan - 50% adherence.  Current exercise: none  Interim History:  She is down 10 lbs since her last visit. She had left knee surgery and has been doing PT.  Not eating all of the food on the plan., Protein intake is as prescribed, Is skipping meals, Not meeting calorie goals., and Water intake is adequate.  Hunger is moderately controlled.  Cravings are moderately controlled.  Assessment/Plan:   Insulin Resistance Kliyah has had elevated fasting insulin readings. Goal is HgbA1c < 5.7, fasting insulin at l0 or less, and preferably at 5.  She denies polyphagia. Lab Results  Component Value Date   HGBA1C 5.2 12/08/2020   Lab Results  Component Value Date   INSULIN 4.7 12/08/2020   INSULIN 8.0 01/15/2020   INSULIN 10.9 11/04/2018   INSULIN 13.2 08/13/2018    Plan Will work on the agreed upon plan. Will minimize refined carbohydrates ( sweets and starches), and focus more on complex carbohydrates.  Increase the micronutrients found in leafy greens, which include magnesium, polyphenols, and vitamin C which have been postulated to help with insulin sensitivity. Minimize "fast food"  and cook more meals at home.  Increase fiber to 25 to 30 grams daily.  Will increase her protein.     Generalized Obesity: Current BMI BMI (Calculated): 35.84    Gabrianna is currently in the action stage of change. As such, her goal is to continue with weight loss efforts.  She has agreed to the Category 3 plan.  Exercise goals: Older adults should do exercises that maintain or improve balance if they are at risk of falling.   Behavioral modification strategies: meal planning , increase water intake, increasing fiber rich foods, and mindful eating.  Nayani has agreed to follow-up with our clinic in 4 weeks.       Objective:   VITALS: Per patient if applicable, see vitals. GENERAL: Alert and in no acute distress. CARDIOPULMONARY: No increased WOB. Speaking in clear sentences.  PSYCH: Pleasant and cooperative. Speech normal rate and rhythm. Affect is appropriate. Insight and judgement are appropriate. Attention is focused, linear, and appropriate.  NEURO: Oriented as arrived to appointment on time with no prompting.   Attestation Statements:    This was prepared with the assistance of Engineer, civil (consulting).  Occasional wrong-word or sound-a-like substitutions may have occurred due to the inherent limitations of voice recognition software.   Corinna Capra, DO

## 2022-11-24 ENCOUNTER — Encounter: Payer: Medicare Other | Admitting: Rehabilitative and Restorative Service Providers"

## 2022-11-27 ENCOUNTER — Ambulatory Visit (INDEPENDENT_AMBULATORY_CARE_PROVIDER_SITE_OTHER): Payer: Medicare Other | Admitting: Rehabilitative and Restorative Service Providers"

## 2022-11-27 ENCOUNTER — Encounter: Payer: Self-pay | Admitting: Rehabilitative and Restorative Service Providers"

## 2022-11-27 DIAGNOSIS — M6281 Muscle weakness (generalized): Secondary | ICD-10-CM | POA: Diagnosis not present

## 2022-11-27 DIAGNOSIS — R262 Difficulty in walking, not elsewhere classified: Secondary | ICD-10-CM | POA: Diagnosis not present

## 2022-11-27 DIAGNOSIS — R600 Localized edema: Secondary | ICD-10-CM | POA: Diagnosis not present

## 2022-11-27 DIAGNOSIS — G8929 Other chronic pain: Secondary | ICD-10-CM

## 2022-11-27 DIAGNOSIS — M25562 Pain in left knee: Secondary | ICD-10-CM

## 2022-11-27 NOTE — Therapy (Signed)
OUTPATIENT PHYSICAL THERAPY TREATMENT NOTE    Patient Name: Vanessa Christensen MRN: [id number] DOB:[birth year], 72 y.o., female Today's Date: 11/27/2022  END OF SESSION:  PT End of Session - 11/27/22 0938     Visit Number 4    Number of Visits 20    Date for PT Re-Evaluation 01/24/23    Authorization Type Medicare/ BCBS Federal    Progress Note Due on Visit 10    PT Start Time 0934    PT Stop Time 1023    PT Time Calculation (min) 49 min    Activity Tolerance Patient tolerated treatment well    Behavior During Therapy [facility] for tasks assessed/performed               Past Medical History:  Diagnosis Date   Anemia    hx of  iron deficient   Anxiety    pt denies   Arthritis    hands and feet   Cancer (HCC) 10/04/2006   breast    left   Cataracts, both eyes    Depression    Depression    Diverticulosis of colon    GERD (gastroesophageal reflux disease)    Glaucoma    Hypertension    no meds   Joint pain    Knee pain    Obesity    OSA on CPAP    Osteoarthritis    Sleep apnea    no CPAP- no longer needed d/t weight loss   Thyroid activity decreased    Past Surgical History:  Procedure Laterality Date   BREAST SURGERY Bilateral    CATARACT EXTRACTION     COLONOSCOPY     GLAUCOMA REPAIR     MASTECTOMY, RADICAL Bilateral    neulasta induced sterile abscesses     THYROIDECTOMY, PARTIAL     TONSILLECTOMY     TOTAL KNEE ARTHROPLASTY Right    TOTAL KNEE ARTHROPLASTY Left 10/27/2022   Procedure: LEFT TOTAL KNEE ARTHROPLASTY;  Surgeon: [name], MD;  Location: [facility];  Service: Orthopedics;  Laterality: Left;   TOTAL KNEE REVISION Right 05/07/2020   Procedure: RIGHT TOTAL KNEE REVISION ARTHROPLASTY;  Surgeon: [name], MD;  Location: [facility];  Service: Orthopedics;  Laterality: Right;   Patient Active Problem List   Diagnosis Date Noted   Adrenal nodule (HCC) 11/16/2022   Thyroid nodule 11/16/2022   Status post total left knee  replacement 10/27/2022   Generalized obesity 10/05/2022   Memory loss 06/12/2022   Essential hypertension 04/13/2022   Morbid obesity (HCC) 04/13/2022   Stress incontinence 11/08/2021   Status post revision of total replacement of right knee 05/07/2020   Failed total knee, right, 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/2020   History of total right knee replacement 01/28/2020   Vitamin D deficiency 08/29/2018   Insulin resistance 08/29/2018   Other specified glaucoma 08/14/2018   Incontinence of urine in female 11/07/2017   Vertigo 11/07/2017   Left knee pain 10/25/2016   Genetic testing 08/08/2016   Hypothyroidism 12/02/2015   Anxiety and depression 12/02/2015   OAB (overactive bladder) 12/02/2015   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/02/2015   BMI 37.0-37.9, adult 12/02/2015    PCP: Sheliah Hatch MD  REFERRING PROVIDER: [name], MD  REFERRING DIAG: [phone] (ICD-10-CM) - Status post total left knee replacement M17.12 (ICD-10-CM) - Unilateral primary osteoarthritis, left knee  THERAPY DIAG:  Chronic pain of left knee  Muscle weakness (generalized)  Difficulty in walking, not elsewhere  classified  Localized edema  Rationale for Evaluation and Treatment: Rehabilitation  ONSET DATE: 10/27/2022 surgery  SUBJECTIVE:   SUBJECTIVE STATEMENT: She indicated having to cancel Friday due to pain in front of knee like a hot poker.  She indicated walking was hard.  Hasn't had it since.     PERTINENT HISTORY: s/p right knee replacement revsision on 05/07/20. 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 History of Lt breast cancer, depression, GERD, HTN, obesity, OA, sleep apnea   PAIN:  NPRS scale: no pain upon arrival.  Pain location: Lt knee Pain description: tightness, achy Aggravating factors: static postures Relieving factors: heat  PRECAUTIONS: None  WEIGHT BEARING RESTRICTIONS: No  FALLS:  Has patient fallen in last 6  months? 1 fall in last 6 months over dog.   Reported no fear of falling.   LIVING ENVIRONMENT:  Lives in: House/apartment Stairs: stairs to enter with rails on both sides .  Full flight but not to bedroom or normal use areas.  Has following equipment at home: [facility], walker  OCCUPATION: Retired  PLOF: Independent  PATIENT GOALS: Reduce pain, improve walking  OBJECTIVE: (objective measures completed at initial evaluation unless otherwise dated)   PATIENT SURVEYS:  11/15/2022 FOTO intake: 51   predicted:  61  COGNITION: 11/15/2022 Overall cognitive status: WFL    SENSATION: 11/15/2022 No specific testing.   EDEMA:  11/15/2022 Visible edema in Lt knee, LE distally  MUSCLE LENGTH: 11/15/2022 No specific testing  POSTURE:  11/15/2022 No Significant postural limitations  PALPATION: 11/15/2022 Tenderness Lt quad, incision area.   Steristrips still on.   LOWER EXTREMITY ROM:   ROM Right 11/15/2022 Left 11/15/2022 Left 11/27/2022  Hip flexion     Hip extension     Hip abduction     Hip adduction     Hip internal rotation     Hip external rotation     Knee flexion  107 AROM in supine heel slide 115 AROM in supine heel slide   Knee extension  -5 AROM in seated LAQ     Ankle dorsiflexion     Ankle plantarflexion     Ankle inversion     Ankle eversion      (Blank rows = not tested)  LOWER EXTREMITY MMT:  MMT Right 11/15/2022 Left 11/15/2022  Hip flexion 5/5 5/5  Hip extension    Hip abduction    Hip adduction    Hip internal rotation    Hip external rotation    Knee flexion 5/5 5/5  Knee extension 5/5 4/5  Ankle dorsiflexion 5/5 4/5  Ankle plantarflexion    Ankle inversion    Ankle eversion     (Blank rows = not tested)  SPECIAL TESTS:  11/15/2022 No specific testing  FUNCTIONAL TESTS:  11/15/2022 18 inch chair transfer: unable without UE assist  Lt SLS: unable on Lt    GAIT: 11/15/2022 SPC in Rt UE with grabbing on to clinician with Lt arm.  Step to  to reduced step length patterns.  Maintained knee flexion in stance.                       TODAY'S TREATMENT  DATE:  11/27/2022 Therex; Nustep lvl 6 10 mins UE/LE Leg press double leg 75 lbs x 25, single leg Lt 31 lbs 2 x 15 - slow lowering focus Slantboard stretch 30 sec X 3 (heel on ground) Forward step up light hand assist 4 inch x 15 WB on Lt leg Lateral step up/down 4 inch step x 15 with light hand assist, performed bilterally   Neuro Re-ed Tandem stance 1 min x 1 bilateral  SLS with contralateral leg touching black mat corners x 6 each, performed bilaterally  Vasopneumatic Room temp water 10 mins Lt knee in elevation medium compression    TODAY'S TREATMENT                                                             DATE:  11/21/22 Scit fit bike seat # 9 X 8 min Slantboard stretch 30 sec X 3 Heel and toe raises X 15 bilat Forward step ups 6 inch step with one UE support X 10 bilat Seated Lt LAQ 2# 2X15 Seated SLR 2X10 bilat Sit to stand with airex pad in chair 2X5, no UE support Manual therapy for Left knee PROM with overpressure into flexion and extension to tolerance Leg press machine DL 40# 9W11, then SL 91# 4N82 each leg She declined ice or vaso, says it makes her hurt    [facility] Adult PT Treatment:                                                DATE: 11/17/22 Therapeutic Exercise: Supine heel slide 2x10 w strap and pillow  Supine quad set 2x10 with heel prop cues for reduced hip compensations LAQ x20 cues for pacing STS x10 from slightly raised mat, no UE support 6 inch step weight shift 3x8 in // bars cues for comfortable ROM and mechanics  HEP review + education    PATIENT EDUCATION:  11/27/2022  Education details: rationale for interventions, HEP  Person educated: Patient Education method: Programmer, multimedia, Demonstration, Verbal cues, and Handouts Education comprehension: verbalized understanding, returned  demonstration, and verbal cues required  HOME EXERCISE PROGRAM: Access Code: [email or web address] URL: [email or web address] Date: 11/15/2022 Prepared by: Chyrel Masson  Exercises - Supine Heel Slide (Mirrored)  - 3-5 x daily - 7 x weekly - 1 sets - 10 reps - 5 hold - Supine Quadricep Sets  - 3-5 x daily - 7 x weekly - 1 sets - 10 reps - 5 hold - Seated Long Arc Quad (Mirrored)  - 3-5 x daily - 7 x weekly - 1 sets - 5-10 reps - 2 hold - Seated Quad Set (Mirrored)  - 3-5 x daily - 7 x weekly - 1 sets - 10 reps - 5 hold - Sit to Stand  - 3 x daily - 7 x weekly - 1 sets - 10 reps  ASSESSMENT:  CLINICAL IMPRESSION: Ambulation c SPC continued in clinic.  Pt progressing overall in interventions for strength and balance but limits still noted, particularly in WB control (stairs) and dynamic balance.   OBJECTIVE IMPAIRMENTS: Abnormal gait, decreased activity tolerance, decreased balance, decreased coordination, decreased endurance, decreased mobility, difficulty walking, decreased ROM, decreased strength, hypomobility,  increased edema, increased fascial restrictions, impaired perceived functional ability, increased muscle spasms, impaired flexibility, improper body mechanics, and pain.   ACTIVITY LIMITATIONS: carrying, lifting, bending, sitting, standing, squatting, sleeping, stairs, transfers, bed mobility, and locomotion level  PARTICIPATION LIMITATIONS: meal prep, cleaning, laundry, interpersonal relationship, driving, shopping, and community activity  PERSONAL FACTORS:  s/p right knee replacement revision on 05/07/20, TKA was 7 years ago. 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  are also affecting patient's functional outcome.   REHAB POTENTIAL: Good  CLINICAL DECISION MAKING: Stable/uncomplicated  EVALUATION COMPLEXITY: Low   GOALS: Goals reviewed with patient? Yes  SHORT TERM GOALS: (target date for Short term goals are 3 weeks 12/06/2022)   1.  Patient will demonstrate  independent use of home exercise program to maintain progress from in clinic treatments.  Goal status: Met   LONG TERM GOALS: (target dates for all long term goals are 10 weeks  01/24/2023 )   1. Patient will demonstrate/report pain at worst less than or equal to 2/10 to facilitate minimal limitation in daily activity secondary to pain symptoms.  Goal status: New   2. Patient will demonstrate independent use of home exercise program to facilitate ability to maintain/progress functional gains from skilled physical therapy services.  Goal status: New   3. Patient will demonstrate FOTO outcome > or = 61 % to indicate reduced disability due to condition.  Goal status: New   4.  Patient will demonstrate Lt  LE MMT 5/5 throughout to faciltiate usual transfers, stairs, squatting at [facility] for daily life.   Goal status: New   5.  Patient will demonstrate Lt knee 0-110 AROM to facilitate usual transfers, ambulation and other mobility of daily life.  Goal status: New   6.  Patient will demonstrate ascending/descending stairs reciprocally s UE assist for community integration.   Goal status: New   7.  Pt will demonstrate independent ambulation community distance > 500 ft s limitation due to symptoms.  Goal Status: New   PLAN:  PT FREQUENCY: 1-2x/week  PT DURATION: 10 weeks  PLANNED INTERVENTIONS: Therapeutic exercises, Therapeutic activity, Neuro Muscular re-education, Balance training, Gait training, Patient/Family education, Joint mobilization, Stair training, DME instructions, Dry Needling, Electrical stimulation, Traction, Cryotherapy, vasopneumatic deviceMoist heat, Taping, Ultrasound, Ionotophoresis 4mg /ml Dexamethasone, and aquatic therapy, Manual therapy.  All included unless contraindicated  PLAN FOR NEXT SESSION: WB strengthening, balance improvements.   Vaso use with room temp water per Pt request.   Chyrel Masson, PT, DPT, OCS, ATC 11/27/22  10:19 AM

## 2022-11-30 ENCOUNTER — Encounter: Payer: Self-pay | Admitting: Rehabilitative and Restorative Service Providers"

## 2022-11-30 ENCOUNTER — Ambulatory Visit (INDEPENDENT_AMBULATORY_CARE_PROVIDER_SITE_OTHER): Payer: Medicare Other | Admitting: Rehabilitative and Restorative Service Providers"

## 2022-11-30 DIAGNOSIS — M6281 Muscle weakness (generalized): Secondary | ICD-10-CM

## 2022-11-30 DIAGNOSIS — G8929 Other chronic pain: Secondary | ICD-10-CM

## 2022-11-30 DIAGNOSIS — R262 Difficulty in walking, not elsewhere classified: Secondary | ICD-10-CM | POA: Diagnosis not present

## 2022-11-30 DIAGNOSIS — M25562 Pain in left knee: Secondary | ICD-10-CM

## 2022-11-30 DIAGNOSIS — R600 Localized edema: Secondary | ICD-10-CM

## 2022-11-30 NOTE — Assessment & Plan Note (Signed)
New.  Noted incidentally on CTA done 6/7.  Adrenal CT recommended and ordered.  Will follow

## 2022-11-30 NOTE — Assessment & Plan Note (Signed)
Stopped Vesicare in January due to confusion and depression but has since been struggling w/ incontinence.  Will try and get Myrbetriq approved to better control sxs.  Pt expressed understanding and is in agreement w/ plan.

## 2022-11-30 NOTE — Therapy (Signed)
OUTPATIENT PHYSICAL THERAPY TREATMENT NOTE    Patient Name: Vanessa Christensen MRN: [id number] DOB:03/14/1951, 72 y.o., female Today's Date: 11/30/2022  END OF SESSION:  PT End of Session - 11/30/22 1052     Visit Number 5    Number of Visits 20    Date for PT Re-Evaluation 01/24/23    Authorization Type Medicare/ BCBS Federal    Progress Note Due on Visit 10    PT Start Time 1053    PT Stop Time 1143    PT Time Calculation (min) 50 min    Activity Tolerance Patient tolerated treatment well    Behavior During Therapy WFL for tasks assessed/performed                Past Medical History:  Diagnosis Date   Anemia    hx of  iron deficient   Anxiety    pt denies   Arthritis    hands and feet   Cancer (HCC) 10/04/2006   breast    left   Cataracts, both eyes    Depression    Depression    Diverticulosis of colon    GERD (gastroesophageal reflux disease)    Glaucoma    Hypertension    no meds   Joint pain    Knee pain    Obesity    OSA on CPAP    Osteoarthritis    Sleep apnea    no CPAP- no longer needed d/t weight loss   Thyroid activity decreased    Past Surgical History:  Procedure Laterality Date   BREAST SURGERY Bilateral    CATARACT EXTRACTION     COLONOSCOPY     GLAUCOMA REPAIR     MASTECTOMY, RADICAL Bilateral    neulasta induced sterile abscesses     THYROIDECTOMY, PARTIAL     TONSILLECTOMY     TOTAL KNEE ARTHROPLASTY Right    TOTAL KNEE ARTHROPLASTY Left 10/27/2022   Procedure: LEFT TOTAL KNEE ARTHROPLASTY;  Surgeon: [name], MD;  Location: [facility];  Service: Orthopedics;  Laterality: Left;   TOTAL KNEE REVISION Right 05/07/2020   Procedure: RIGHT TOTAL KNEE REVISION ARTHROPLASTY;  Surgeon: [name], MD;  Location: [facility];  Service: Orthopedics;  Laterality: Right;   Patient Active Problem List   Diagnosis Date Noted   Adrenal nodule (HCC) 11/16/2022   Thyroid nodule 11/16/2022   Status post total left knee  replacement 10/27/2022   Generalized obesity 10/05/2022   Memory loss 06/12/2022   Essential hypertension 04/13/2022   Morbid obesity (HCC) 04/13/2022   Stress incontinence 11/08/2021   Status post revision of total replacement of right knee 05/07/2020   Failed total knee, right, 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/2020   History of total right knee replacement 01/28/2020   Vitamin D deficiency 08/29/2018   Insulin resistance 08/29/2018   Other specified glaucoma 08/14/2018   Incontinence of urine in female 11/07/2017   Vertigo 11/07/2017   Left knee pain 10/25/2016   Genetic testing 08/08/2016   Hypothyroidism 12/02/2015   Anxiety and depression 12/02/2015   OAB (overactive bladder) 12/02/2015   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/02/2015   BMI 37.0-37.9, adult 12/02/2015    PCP: Sheliah Hatch MD  REFERRING PROVIDER: [name], MD  REFERRING DIAG: [phone] (ICD-10-CM) - Status post total left knee replacement M17.12 (ICD-10-CM) - Unilateral primary osteoarthritis, left knee  THERAPY DIAG:  Chronic pain of left knee  Muscle weakness (generalized)  Difficulty in walking, not  elsewhere classified  Localized edema  Rationale for Evaluation and Treatment: Rehabilitation  ONSET DATE: 10/27/2022 surgery  SUBJECTIVE:   SUBJECTIVE STATEMENT: Reported no pain today.  Reported having bike at home but has to do stairs to get to it.    PERTINENT HISTORY: s/p right knee replacement revsision on 05/07/20. 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 History of Lt breast cancer, depression, GERD, HTN, obesity, OA, sleep apnea   PAIN:  NPRS scale: no pain upon arrival.  Pain location: Lt knee Pain description: tightness, achy Aggravating factors: static postures Relieving factors: heat  PRECAUTIONS: None  WEIGHT BEARING RESTRICTIONS: No  FALLS:  Has patient fallen in last 6 months? 1 fall in last 6 months over dog.    Reported no fear of falling.   LIVING ENVIRONMENT:  Lives in: House/apartment Stairs: stairs to enter with rails on both sides .  Full flight but not to bedroom or normal use areas.  Has following equipment at home: [facility], walker  OCCUPATION: Retired  PLOF: Independent  PATIENT GOALS: Reduce pain, improve walking  OBJECTIVE: (objective measures completed at initial evaluation unless otherwise dated)   PATIENT SURVEYS:  11/15/2022 FOTO intake: 51   predicted:  61  COGNITION: 11/15/2022 Overall cognitive status: WFL    SENSATION: 11/15/2022 No specific testing.   EDEMA:  11/15/2022 Visible edema in Lt knee, LE distally  MUSCLE LENGTH: 11/15/2022 No specific testing  POSTURE:  11/15/2022 No Significant postural limitations  PALPATION: 11/15/2022 Tenderness Lt quad, incision area.   Steristrips still on.   LOWER EXTREMITY ROM:   ROM Right 11/15/2022 Left 11/15/2022 Left 11/27/2022  Hip flexion     Hip extension     Hip abduction     Hip adduction     Hip internal rotation     Hip external rotation     Knee flexion  107 AROM in supine heel slide 115 AROM in supine heel slide   Knee extension  -5 AROM in seated LAQ     Ankle dorsiflexion     Ankle plantarflexion     Ankle inversion     Ankle eversion      (Blank rows = not tested)  LOWER EXTREMITY MMT:  MMT Right 11/15/2022 Left 11/15/2022  Hip flexion 5/5 5/5  Hip extension    Hip abduction    Hip adduction    Hip internal rotation    Hip external rotation    Knee flexion 5/5 5/5  Knee extension 5/5 4/5  Ankle dorsiflexion 5/5 4/5  Ankle plantarflexion    Ankle inversion    Ankle eversion     (Blank rows = not tested)  SPECIAL TESTS:  11/15/2022 No specific testing  FUNCTIONAL TESTS:  11/15/2022 18 inch chair transfer: unable without UE assist  Lt SLS: unable on Lt    GAIT: 11/15/2022 SPC in Rt UE with grabbing on to clinician with Lt arm.  Step to to reduced step length patterns.   Maintained knee flexion in stance.                       TODAY'S TREATMENT                                                             DATE:  11/30/2022 Therex; UBE LE only ROM lvl 2.0 8 mins, seat 9 Leg press double leg 87 lbs x 20, single leg 37 lbs 2 x 15 performed bilaterally- slow lowering focus Slantboard stretch 30 sec X 3 (heel on ground) Forward step up 6 inch with Lt hand on bar (like at home going up stairs) x 15 bilateral Lateral step up/down 4 inch step x 15 with light hand assist, performed bilterally Knee extension machine double leg up, Lt leg lowering slowly 10 lbs 2 x 10   Neuro Re-ed Tandem stance 1 min x 1 bilateral  SLS c contralateral leg clear over 6 inch hurdle x 15 bilateral   Vasopneumatic Room temp water 10 mins Lt knee in elevation medium compression   TODAY'S TREATMENT                                                             DATE:  11/27/2022 Therex; Nustep lvl 6 10 mins UE/LE Leg press double leg 75 lbs x 25, single leg Lt 31 lbs 2 x 15 - slow lowering focus Slantboard stretch 30 sec X 3 (heel on ground) Forward step up light hand assist 4 inch x 15 WB on Lt leg Lateral step up/down 4 inch step x 15 with light hand assist, performed bilterally   Neuro Re-ed Tandem stance 1 min x 1 bilateral  SLS with contralateral leg touching black mat corners x 6 each, performed bilaterally  Vasopneumatic Room temp water 10 mins Lt knee in elevation medium compression    TODAY'S TREATMENT                                                             DATE:  11/21/22 Scit fit bike seat # 9 X 8 min Slantboard stretch 30 sec X 3 Heel and toe raises X 15 bilat Forward step ups 6 inch step with one UE support X 10 bilat Seated Lt LAQ 2# 2X15 Seated SLR 2X10 bilat Sit to stand with airex pad in chair 2X5, no UE support Manual therapy for Left knee PROM with overpressure into flexion and extension to tolerance Leg press machine DL 57# [id number], then SL 62# [id number] each  leg She declined ice or vaso, says it makes her hurt    [facility] Adult PT Treatment:                                                DATE: 11/17/22 Therapeutic Exercise: Supine heel slide 2x10 w strap and pillow  Supine quad set 2x10 with heel prop cues for reduced hip compensations LAQ x20 cues for pacing STS x10 from slightly raised mat, no UE support 6 inch step weight shift 3x8 in // bars cues for comfortable ROM and mechanics  HEP review + education    PATIENT EDUCATION:  11/30/2022  Education details: rationale for interventions, HEP  Person educated: Patient Education method: Explanation, Demonstration, Verbal cues, and Handouts Education comprehension: verbalized understanding,  returned demonstration, and verbal cues required  HOME EXERCISE PROGRAM: Access Code: [email or web address] URL: [email or web address] Date: 11/15/2022 Prepared by: Chyrel Masson  Exercises - Supine Heel Slide (Mirrored)  - 3-5 x daily - 7 x weekly - 1 sets - 10 reps - 5 hold - Supine Quadricep Sets  - 3-5 x daily - 7 x weekly - 1 sets - 10 reps - 5 hold - Seated Long Arc Quad (Mirrored)  - 3-5 x daily - 7 x weekly - 1 sets - 5-10 reps - 2 hold - Seated Quad Set (Mirrored)  - 3-5 x daily - 7 x weekly - 1 sets - 10 reps - 5 hold - Sit to Stand  - 3 x daily - 7 x weekly - 1 sets - 10 reps  ASSESSMENT:  CLINICAL IMPRESSION: Step at 6 inch required hand assist.  Mimicked home stairs with use of rail on Lt side going up. Recommended continued skilled PT services to progress strength and control to continue progression towards independent ambulation at PLOF.   OBJECTIVE IMPAIRMENTS: Abnormal gait, decreased activity tolerance, decreased balance, decreased coordination, decreased endurance, decreased mobility, difficulty walking, decreased ROM, decreased strength, hypomobility, increased edema, increased fascial restrictions, impaired perceived functional ability, increased muscle spasms, impaired  flexibility, improper body mechanics, and pain.   ACTIVITY LIMITATIONS: carrying, lifting, bending, sitting, standing, squatting, sleeping, stairs, transfers, bed mobility, and locomotion level  PARTICIPATION LIMITATIONS: meal prep, cleaning, laundry, interpersonal relationship, driving, shopping, and community activity  PERSONAL FACTORS:  s/p right knee replacement revision on 05/07/20, TKA was 7 years ago. 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  are also affecting patient's functional outcome.   REHAB POTENTIAL: Good  CLINICAL DECISION MAKING: Stable/uncomplicated  EVALUATION COMPLEXITY: Low   GOALS: Goals reviewed with patient? Yes  SHORT TERM GOALS: (target date for Short term goals are 3 weeks 12/06/2022)   1.  Patient will demonstrate independent use of home exercise program to maintain progress from in clinic treatments.  Goal status: Met   LONG TERM GOALS: (target dates for all long term goals are 10 weeks  01/24/2023 )   1. Patient will demonstrate/report pain at worst less than or equal to 2/10 to facilitate minimal limitation in daily activity secondary to pain symptoms.  Goal status: New   2. Patient will demonstrate independent use of home exercise program to facilitate ability to maintain/progress functional gains from skilled physical therapy services.  Goal status: New   3. Patient will demonstrate FOTO outcome > or = 61 % to indicate reduced disability due to condition.  Goal status: New   4.  Patient will demonstrate Lt  LE MMT 5/5 throughout to faciltiate usual transfers, stairs, squatting at [facility] for daily life.   Goal status: New   5.  Patient will demonstrate Lt knee 0-110 AROM to facilitate usual transfers, ambulation and other mobility of daily life.  Goal status: New   6.  Patient will demonstrate ascending/descending stairs reciprocally s UE assist for community integration.   Goal status: New   7.  Pt will demonstrate independent  ambulation community distance > 500 ft s limitation due to symptoms.  Goal Status: New   PLAN:  PT FREQUENCY: 1-2x/week  PT DURATION: 10 weeks  PLANNED INTERVENTIONS: Therapeutic exercises, Therapeutic activity, Neuro Muscular re-education, Balance training, Gait training, Patient/Family education, Joint mobilization, Stair training, DME instructions, Dry Needling, Electrical stimulation, Traction, Cryotherapy, vasopneumatic deviceMoist heat, Taping, Ultrasound, Ionotophoresis 4mg /ml Dexamethasone, and aquatic therapy, Manual therapy.  All included  unless contraindicated  PLAN FOR NEXT SESSION: Continue quad strengthening, stair navigation improvements.  Independent ambulation trials in clinic.   Vaso use with room temp water per Pt request.   Chyrel Masson, PT, DPT, OCS, ATC 11/30/22  11:35 AM

## 2022-11-30 NOTE — Assessment & Plan Note (Signed)
Deteriorated since knee surgery.  She is panicking about things such as having stitches removed yesterday.  Currently on Wellbutrin XL 300mg  daiy and Fluoxetine 40mg  daily w/ Trazodone nightly.  She has not been sleeping well since surgery which is likely impacting her mood.  Will add low dose Clonazepam for panicked moments and follow closely.  Pt expressed understanding and is in agreement w/ plan.

## 2022-11-30 NOTE — Assessment & Plan Note (Signed)
New.  Found incidentally on CTA.  Will get thyroid US to better assess.  Pt expressed understanding and is in agreement w/ plan.

## 2022-12-04 ENCOUNTER — Encounter: Payer: Self-pay | Admitting: Rehabilitative and Restorative Service Providers"

## 2022-12-04 ENCOUNTER — Ambulatory Visit (INDEPENDENT_AMBULATORY_CARE_PROVIDER_SITE_OTHER): Payer: Medicare Other | Admitting: Rehabilitative and Restorative Service Providers"

## 2022-12-04 DIAGNOSIS — R600 Localized edema: Secondary | ICD-10-CM | POA: Diagnosis not present

## 2022-12-04 DIAGNOSIS — R262 Difficulty in walking, not elsewhere classified: Secondary | ICD-10-CM | POA: Diagnosis not present

## 2022-12-04 DIAGNOSIS — M25562 Pain in left knee: Secondary | ICD-10-CM

## 2022-12-04 DIAGNOSIS — G8929 Other chronic pain: Secondary | ICD-10-CM | POA: Diagnosis not present

## 2022-12-04 DIAGNOSIS — M6281 Muscle weakness (generalized): Secondary | ICD-10-CM

## 2022-12-04 NOTE — Therapy (Signed)
OUTPATIENT PHYSICAL THERAPY TREATMENT NOTE / PROGRESS NOTE   Patient Name: Vanessa Christensen MRN: [id number] DOB:1950-11-12, 72 y.o., female Today's Date: 12/04/2022  Progress Note Reporting Period 11/15/2022 to 12/04/2022  See note below for Objective Data and Assessment of Progress/Goals.      END OF SESSION:  PT End of Session - 12/04/22 1104     Visit Number 6    Number of Visits 20    Date for PT Re-Evaluation 01/24/23    Authorization Type Medicare/ BCBS Federal    Progress Note Due on Visit 16    PT Start Time 1059    PT Stop Time 1148    PT Time Calculation (min) 49 min    Activity Tolerance Patient tolerated treatment well    Behavior During Therapy WFL for tasks assessed/performed                 Past Medical History:  Diagnosis Date   Anemia    hx of  iron deficient   Anxiety    pt denies   Arthritis    hands and feet   Cancer (HCC) 10/04/2006   breast    left   Cataracts, both eyes    Depression    Depression    Diverticulosis of colon    GERD (gastroesophageal reflux disease)    Glaucoma    Hypertension    no meds   Joint pain    Knee pain    Obesity    OSA on CPAP    Osteoarthritis    Sleep apnea    no CPAP- no longer needed d/t weight loss   Thyroid activity decreased    Past Surgical History:  Procedure Laterality Date   BREAST SURGERY Bilateral    CATARACT EXTRACTION     COLONOSCOPY     GLAUCOMA REPAIR     MASTECTOMY, RADICAL Bilateral    neulasta induced sterile abscesses     THYROIDECTOMY, PARTIAL     TONSILLECTOMY     TOTAL KNEE ARTHROPLASTY Right    TOTAL KNEE ARTHROPLASTY Left 10/27/2022   Procedure: LEFT TOTAL KNEE ARTHROPLASTY;  Surgeon: [name], MD;  Location: [facility];  Service: Orthopedics;  Laterality: Left;   TOTAL KNEE REVISION Right 05/07/2020   Procedure: RIGHT TOTAL KNEE REVISION ARTHROPLASTY;  Surgeon: [name], MD;  Location: [facility];  Service: Orthopedics;  Laterality: Right;    Patient Active Problem List   Diagnosis Date Noted   Adrenal nodule (HCC) 11/16/2022   Thyroid nodule 11/16/2022   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   Generalized obesity 10/05/2022   Memory loss 06/12/2022   Essential hypertension 04/13/2022   Morbid obesity (HCC) 04/13/2022   Stress incontinence 11/08/2021   Status post revision of total replacement of right knee 05/07/2020   Failed total knee, right, 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/2020   History of total right knee replacement 01/28/2020   Vitamin D deficiency 08/29/2018   Insulin resistance 08/29/2018   Other specified glaucoma 08/14/2018   Incontinence of urine in female 11/07/2017   Vertigo 11/07/2017   Left knee pain 10/25/2016   Genetic testing 08/08/2016   Hypothyroidism 12/02/2015   Anxiety and depression 12/02/2015   OAB (overactive bladder) 12/02/2015   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/02/2015   BMI 37.0-37.9, adult 12/02/2015    PCP: Sheliah Hatch MD  REFERRING PROVIDER: [name], MD  REFERRING DIAG: [phone] (ICD-10-CM) - Status post total left knee replacement  M17.12 (ICD-10-CM) - Unilateral primary osteoarthritis, left knee  THERAPY DIAG:  Chronic pain of left knee  Muscle weakness (generalized)  Difficulty in walking, not elsewhere classified  Localized edema  Rationale for Evaluation and Treatment: Rehabilitation  ONSET DATE: 10/27/2022 surgery  SUBJECTIVE:   SUBJECTIVE STATEMENT: She reported insidious pains noted, no specific to activity.     PERTINENT HISTORY: s/p right knee replacement revsision on 05/07/20. 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 History of Lt breast cancer, depression, GERD, HTN, obesity, OA, sleep apnea   PAIN:  NPRS scale: at worst 3-4/10 Pain location: Lt knee Pain description: grabs/spikes Aggravating factors: random  Relieving factors: heat  PRECAUTIONS: None  WEIGHT BEARING  RESTRICTIONS: No  FALLS:  Has patient fallen in last 6 months? 1 fall in last 6 months over dog.   Reported no fear of falling.   LIVING ENVIRONMENT:  Lives in: House/apartment Stairs: stairs to enter with rails on both sides .  Full flight but not to bedroom or normal use areas.  Has following equipment at home: [facility], walker  OCCUPATION: Retired  PLOF: Independent  PATIENT GOALS: Reduce pain, improve walking  OBJECTIVE: (objective measures completed at initial evaluation unless otherwise dated)   PATIENT SURVEYS:  11/15/2022 FOTO intake: 51   predicted:  61  COGNITION: 11/15/2022 Overall cognitive status: WFL    SENSATION: 11/15/2022 No specific testing.   EDEMA:  11/15/2022 Visible edema in Lt knee, LE distally  MUSCLE LENGTH: 11/15/2022 No specific testing  POSTURE:  11/15/2022 No Significant postural limitations  PALPATION: 11/15/2022 Tenderness Lt quad, incision area.   Steristrips still on.   LOWER EXTREMITY ROM:   ROM Right 11/15/2022 Left 11/15/2022 Left 11/27/2022 Left 12/04/2022  Hip flexion      Hip extension      Hip abduction      Hip adduction      Hip internal rotation      Hip external rotation      Knee flexion  107 AROM in supine heel slide 115 AROM in supine heel slide  120 AROM in supine heel slide  Knee extension  -5 AROM in seated LAQ    -2 in seated LAQ AROM  Ankle dorsiflexion      Ankle plantarflexion      Ankle inversion      Ankle eversion       (Blank rows = not tested)  LOWER EXTREMITY MMT:  MMT Right 11/15/2022 Left 11/15/2022 Right 12/04/2022 Left 12/04/2022  Hip flexion 5/5 5/5    Hip extension      Hip abduction      Hip adduction      Hip internal rotation      Hip external rotation      Knee flexion 5/5 5/5    Knee extension 5/5 4/5 5/5 40, 38.1 lbs 5/5 37.9, 35 lbs  Ankle dorsiflexion 5/5 4/5    Ankle plantarflexion      Ankle inversion      Ankle eversion       (Blank rows = not tested)  SPECIAL TESTS:   11/15/2022 No specific testing  FUNCTIONAL TESTS:  12/04/2022:    11/15/2022 18 inch chair transfer: unable without UE assist  Lt SLS: unable on Lt    GAIT: 12/04/2022:  Arrived with [facility] but able to walk independently.   11/15/2022 SPC in Rt UE with grabbing on to clinician with Lt arm.  Step to to reduced step length patterns.  Maintained knee flexion in  stance.                       TODAY'S TREATMENT                                                             DATE:  12/04/2022 Therex; UBE LE only ROM lvl 3.0 8 mins, seat 8 Knee extension machine double leg up, Lt leg lowering slowly 5 lbs 2 x 15  Slantboard stretch 30 sec X 3 (heel on ground) Forward step up 6 inch with Lt hand on bar (like at home going up stairs) x 15 Lateral step up/down 4 inch step x 15 with light hand assist, performed bilterally Supine Lt leg LAQ with 2 sec pause in end ranges in 90 deg hip flexion x 15 Supine hamstring stretch c strap Lt 15 sec x 3  Vasopneumatic Room temp water 10 mins Lt knee in elevation medium compression   TODAY'S TREATMENT                                                             DATE:  11/30/2022 Therex; UBE LE only ROM lvl 2.0 8 mins, seat 9 Leg press double leg 87 lbs x 20, single leg 37 lbs 2 x 15 performed bilaterally- slow lowering focus Slantboard stretch 30 sec X 3 (heel on ground) Forward step up 6 inch with Lt hand on bar (like at home going up stairs) x 15 bilateral Lateral step up/down 4 inch step x 15 with light hand assist, performed bilterally Knee extension machine double leg up, Lt leg lowering slowly 10 lbs 2 x 10   Neuro Re-ed Tandem stance 1 min x 1 bilateral  SLS c contralateral leg clear over 6 inch hurdle x 15 bilateral   Vasopneumatic Room temp water 10 mins Lt knee in elevation medium compression   TODAY'S TREATMENT                                                             DATE:  11/27/2022 Therex; Nustep lvl 6 10 mins UE/LE Leg press double leg  75 lbs x 25, single leg Lt 31 lbs 2 x 15 - slow lowering focus Slantboard stretch 30 sec X 3 (heel on ground) Forward step up light hand assist 4 inch x 15 WB on Lt leg Lateral step up/down 4 inch step x 15 with light hand assist, performed bilterally   Neuro Re-ed Tandem stance 1 min x 1 bilateral  SLS with contralateral leg touching black mat corners x 6 each, performed bilaterally  Vasopneumatic Room temp water 10 mins Lt knee in elevation medium compression    PATIENT EDUCATION:  12/04/2022  Education details: rationale for interventions, HEP  Person educated: Patient Education method: Programmer, multimedia, Facilities manager, Verbal cues, and Handouts Education comprehension: verbalized understanding, returned demonstration, and verbal cues required  HOME EXERCISE PROGRAM:  Access Code: [email or web address] URL: [email or web address] Date: 11/15/2022 Prepared by: Chyrel Masson  Exercises - Supine Heel Slide (Mirrored)  - 3-5 x daily - 7 x weekly - 1 sets - 10 reps - 5 hold - Supine Quadricep Sets  - 3-5 x daily - 7 x weekly - 1 sets - 10 reps - 5 hold - Seated Long Arc Quad (Mirrored)  - 3-5 x daily - 7 x weekly - 1 sets - 5-10 reps - 2 hold - Seated Quad Set (Mirrored)  - 3-5 x daily - 7 x weekly - 1 sets - 10 reps - 5 hold - Sit to Stand  - 3 x daily - 7 x weekly - 1 sets - 10 reps  ASSESSMENT:  CLINICAL IMPRESSION: Pt has attended 6 visits overall during course of treatment.  Gains noted in objective data listed above.  Continued skilled PT services warranted at this time to continue to progress towards goals.   OBJECTIVE IMPAIRMENTS: Abnormal gait, decreased activity tolerance, decreased balance, decreased coordination, decreased endurance, decreased mobility, difficulty walking, decreased ROM, decreased strength, hypomobility, increased edema, increased fascial restrictions, impaired perceived functional ability, increased muscle spasms, impaired flexibility, improper body  mechanics, and pain.   ACTIVITY LIMITATIONS: carrying, lifting, bending, sitting, standing, squatting, sleeping, stairs, transfers, bed mobility, and locomotion level  PARTICIPATION LIMITATIONS: meal prep, cleaning, laundry, interpersonal relationship, driving, shopping, and community activity  PERSONAL FACTORS:  s/p right knee replacement revision on 05/07/20, TKA was 7 years ago. 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  are also affecting patient's functional outcome.   REHAB POTENTIAL: Good  CLINICAL DECISION MAKING: Stable/uncomplicated  EVALUATION COMPLEXITY: Low   GOALS: Goals reviewed with patient? Yes  SHORT TERM GOALS: (target date for Short term goals are 3 weeks 12/06/2022)   1.  Patient will demonstrate independent use of home exercise program to maintain progress from in clinic treatments.  Goal status: Met   LONG TERM GOALS: (target dates for all long term goals are 10 weeks  01/24/2023 )   1. Patient will demonstrate/report pain at worst less than or equal to 2/10 to facilitate minimal limitation in daily activity secondary to pain symptoms.  Goal status: on going 12/04/2022   2. Patient will demonstrate independent use of home exercise program to facilitate ability to maintain/progress functional gains from skilled physical therapy services.  Goal status: on going 12/04/2022   3. Patient will demonstrate FOTO outcome > or = 61 % to indicate reduced disability due to condition.  Goal status: on going 12/04/2022   4.  Patient will demonstrate Lt  LE MMT 5/5 throughout to faciltiate usual transfers, stairs, squatting at [facility] for daily life.   Goal status: Met 12/04/2022   5.  Patient will demonstrate Lt knee 0-110 AROM to facilitate usual transfers, ambulation and other mobility of daily life.  Goal status: Mostly met 12/04/2022   6.  Patient will demonstrate ascending/descending stairs reciprocally s UE assist for community integration.   Goal status: on going  12/04/2022   7.  Pt will demonstrate independent ambulation community distance > 500 ft s limitation due to symptoms.  Goal Status: on going 12/04/2022   PLAN:  PT FREQUENCY: 1-2x/week  PT DURATION: 10 weeks  PLANNED INTERVENTIONS: Therapeutic exercises, Therapeutic activity, Neuro Muscular re-education, Balance training, Gait training, Patient/Family education, Joint mobilization, Stair training, DME instructions, Dry Needling, Electrical stimulation, Traction, Cryotherapy, vasopneumatic deviceMoist heat, Taping, Ultrasound, Ionotophoresis 4mg /ml Dexamethasone, and aquatic therapy, Manual therapy.  All included unless contraindicated  PLAN FOR NEXT SESSION: Progressive strengthening, stair improvements, dynamic balance control.   Vaso use with room temp water per Pt request.   Chyrel Masson, PT, DPT, OCS, ATC 12/04/22  11:34 AM

## 2022-12-06 ENCOUNTER — Encounter: Payer: Medicare Other | Admitting: Rehabilitative and Restorative Service Providers"

## 2022-12-11 ENCOUNTER — Ambulatory Visit (INDEPENDENT_AMBULATORY_CARE_PROVIDER_SITE_OTHER): Payer: Medicare Other | Admitting: Physician Assistant

## 2022-12-11 ENCOUNTER — Ambulatory Visit (INDEPENDENT_AMBULATORY_CARE_PROVIDER_SITE_OTHER): Payer: Medicare Other | Admitting: Rehabilitative and Restorative Service Providers"

## 2022-12-11 ENCOUNTER — Other Ambulatory Visit: Payer: Self-pay | Admitting: Family Medicine

## 2022-12-11 ENCOUNTER — Telehealth: Payer: Self-pay

## 2022-12-11 ENCOUNTER — Encounter: Payer: Self-pay | Admitting: Physician Assistant

## 2022-12-11 ENCOUNTER — Encounter: Payer: Self-pay | Admitting: Rehabilitative and Restorative Service Providers"

## 2022-12-11 DIAGNOSIS — G8929 Other chronic pain: Secondary | ICD-10-CM

## 2022-12-11 DIAGNOSIS — M25562 Pain in left knee: Secondary | ICD-10-CM | POA: Diagnosis not present

## 2022-12-11 DIAGNOSIS — E278 Other specified disorders of adrenal gland: Secondary | ICD-10-CM

## 2022-12-11 DIAGNOSIS — R600 Localized edema: Secondary | ICD-10-CM | POA: Diagnosis not present

## 2022-12-11 DIAGNOSIS — M6281 Muscle weakness (generalized): Secondary | ICD-10-CM

## 2022-12-11 DIAGNOSIS — R262 Difficulty in walking, not elsewhere classified: Secondary | ICD-10-CM | POA: Diagnosis not present

## 2022-12-11 DIAGNOSIS — R9389 Abnormal findings on diagnostic imaging of other specified body structures: Secondary | ICD-10-CM

## 2022-12-11 DIAGNOSIS — Z96652 Presence of left artificial knee joint: Secondary | ICD-10-CM

## 2022-12-11 NOTE — Progress Notes (Signed)
HPI: [name] returns today 6 weeks status post left total knee arthroplasty.  She states overall she is doing well.  Ports her pain to be 4 out of 10 pain at worst.  Notes mostly pain at night.  She is ambulating with a cane outside the home.  She is only taking ibuprofen for pain.  Review of systems see HPI otherwise negative  Physical exam: General well-developed well-nourished female no acute distress. Left knee full extension flexion full.  No instability valgus varus stressing.  Incisions healing well.  Calf supple nontender.  Impression: Status post left total knee arthroplasty 10/27/2022  Plan: She will continue work on range of motion strengthening the knee.  Continue work on scar tissue mobilization.  Will see her back at 6 months postop and obtain AP and lateral view of her left knee at that time.  She will follow-up with [name] sooner if there is any questions or concerns.

## 2022-12-11 NOTE — Telephone Encounter (Signed)
Radiology has called and needing a corrected order can someone please help me ? This is the message below from them   Hello Vanessa Christensen, Dr Beverely Low placed an order for this patient for: CT adrenal abdomonal without, the correct order would actually be CT abd with and with out. Could you get this updated for [name] please?

## 2022-12-11 NOTE — Therapy (Signed)
OUTPATIENT PHYSICAL THERAPY TREATMENT NOTE   Patient Name: Vanessa Christensen MRN: [id number] DOB:06/11/1950, 72 y.o., female Today's Date: 12/11/2022  END OF SESSION:  PT End of Session - 12/11/22 1022     Visit Number 7    Number of Visits 20    Date for PT Re-Evaluation 01/24/23    Authorization Type Medicare/ BCBS Federal    Progress Note Due on Visit 16    PT Start Time 1011    PT Stop Time 1052    PT Time Calculation (min) 41 min    Activity Tolerance Patient tolerated treatment well    Behavior During Therapy WFL for tasks assessed/performed                  Past Medical History:  Diagnosis Date   Anemia    hx of  iron deficient   Anxiety    pt denies   Arthritis    hands and feet   Cancer (HCC) 10/04/2006   breast    left   Cataracts, both eyes    Depression    Depression    Diverticulosis of colon    GERD (gastroesophageal reflux disease)    Glaucoma    Hypertension    no meds   Joint pain    Knee pain    Obesity    OSA on CPAP    Osteoarthritis    Sleep apnea    no CPAP- no longer needed d/t weight loss   Thyroid activity decreased    Past Surgical History:  Procedure Laterality Date   BREAST SURGERY Bilateral    CATARACT EXTRACTION     COLONOSCOPY     GLAUCOMA REPAIR     MASTECTOMY, RADICAL Bilateral    neulasta induced sterile abscesses     THYROIDECTOMY, PARTIAL     TONSILLECTOMY     TOTAL KNEE ARTHROPLASTY Right    TOTAL KNEE ARTHROPLASTY Left 10/27/2022   Procedure: LEFT TOTAL KNEE ARTHROPLASTY;  Surgeon: [name], MD;  Location: [facility];  Service: Orthopedics;  Laterality: Left;   TOTAL KNEE REVISION Right 05/07/2020   Procedure: RIGHT TOTAL KNEE REVISION ARTHROPLASTY;  Surgeon: [name], MD;  Location: [facility];  Service: Orthopedics;  Laterality: Right;   Patient Active Problem List   Diagnosis Date Noted   Adrenal nodule (HCC) 11/16/2022   Thyroid nodule 11/16/2022   Status post total left knee  replacement 10/27/2022   Generalized obesity 10/05/2022   Memory loss 06/12/2022   Essential hypertension 04/13/2022   Morbid obesity (HCC) 04/13/2022   Stress incontinence 11/08/2021   Status post revision of total replacement of right knee 05/07/2020   Failed total knee, right, 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/2020   History of total right knee replacement 01/28/2020   Vitamin D deficiency 08/29/2018   Insulin resistance 08/29/2018   Other specified glaucoma 08/14/2018   Incontinence of urine in female 11/07/2017   Vertigo 11/07/2017   Left knee pain 10/25/2016   Genetic testing 08/08/2016   Hypothyroidism 12/02/2015   Anxiety and depression 12/02/2015   OAB (overactive bladder) 12/02/2015   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/02/2015   BMI 37.0-37.9, adult 12/02/2015    PCP: Sheliah Hatch MD  REFERRING PROVIDER: [name], MD  REFERRING DIAG: [phone] (ICD-10-CM) - Status post total left knee replacement M17.12 (ICD-10-CM) - Unilateral primary osteoarthritis, left knee  THERAPY DIAG:  Chronic pain of left knee  Muscle weakness (generalized)  Difficulty in walking,  not elsewhere classified  Localized edema  Rationale for Evaluation and Treatment: Rehabilitation  ONSET DATE: 10/27/2022 surgery  SUBJECTIVE:   SUBJECTIVE STATEMENT: She reported no knee pain.  Having some tightness in back of knee but movement and stretching does help.  Will return to MD in approx. 4 months due to good progress.    PERTINENT HISTORY: s/p right knee replacement revsision on 05/07/20. 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 History of Lt breast cancer, depression, GERD, HTN, obesity, OA, sleep apnea   PAIN:  NPRS scale:no pain upon arrival.  Pain location: Lt knee Pain description: grabs/spikes Aggravating factors: random  Relieving factors: heat  PRECAUTIONS: None  WEIGHT BEARING RESTRICTIONS: No  FALLS:  Has patient  fallen in last 6 months? 1 fall in last 6 months over dog.   Reported no fear of falling.   LIVING ENVIRONMENT:  Lives in: House/apartment Stairs: stairs to enter with rails on both sides .  Full flight but not to bedroom or normal use areas.  Has following equipment at home: [facility], walker  OCCUPATION: Retired  PLOF: Independent  PATIENT GOALS: Reduce pain, improve walking  OBJECTIVE: (objective measures completed at initial evaluation unless otherwise dated)   PATIENT SURVEYS:  12/11/2022:  FOTO update:  69  11/15/2022 FOTO intake: 51   predicted:  61  COGNITION: 11/15/2022 Overall cognitive status: WFL    SENSATION: 11/15/2022 No specific testing.   EDEMA:  11/15/2022 Visible edema in Lt knee, LE distally  MUSCLE LENGTH: 11/15/2022 No specific testing  POSTURE:  11/15/2022 No Significant postural limitations  PALPATION: 11/15/2022 Tenderness Lt quad, incision area.   Steristrips still on.   LOWER EXTREMITY ROM:   ROM Right 11/15/2022 Left 11/15/2022 Left 11/27/2022 Left 12/04/2022  Hip flexion      Hip extension      Hip abduction      Hip adduction      Hip internal rotation      Hip external rotation      Knee flexion  107 AROM in supine heel slide 115 AROM in supine heel slide  120 AROM in supine heel slide  Knee extension  -5 AROM in seated LAQ    -2 in seated LAQ AROM  Ankle dorsiflexion      Ankle plantarflexion      Ankle inversion      Ankle eversion       (Blank rows = not tested)  LOWER EXTREMITY MMT:  MMT Right 11/15/2022 Left 11/15/2022 Right 12/04/2022 Left 12/04/2022  Hip flexion 5/5 5/5    Hip extension      Hip abduction      Hip adduction      Hip internal rotation      Hip external rotation      Knee flexion 5/5 5/5    Knee extension 5/5 4/5 5/5 40, 38.1 lbs 5/5 37.9, 35 lbs  Ankle dorsiflexion 5/5 4/5    Ankle plantarflexion      Ankle inversion      Ankle eversion       (Blank rows = not tested)  SPECIAL TESTS:   11/15/2022 No specific testing  FUNCTIONAL TESTS:  12/04/2022:    11/15/2022 18 inch chair transfer: unable without UE assist  Lt SLS: unable on Lt    GAIT: 12/04/2022:  Arrived with [facility] but able to walk independently.   11/15/2022 SPC in Rt UE with grabbing on to clinician with Lt arm.  Step to to reduced step length patterns.  Maintained knee flexion in stance.                       TODAY'S TREATMENT                                                             DATE:  12/11/2022 Therex; UBE LE only ROM lvl 3.0 12 mins, seat 8 Leg press Double leg 87 lbs x 15, single leg 2 x 15 performed bilaterally  Supine Lt leg hip extension straight leg 10 sec hold x 5 Supine Lt leg hamstring stretch 30 sec x 3 Incline gastroc stretch 30 sec x 3   TherActivity Ascending/descending flight of stairs with Rt handrail going down, Lt going up to mimic house navigation with reciprocal gait pattern.    TODAY'S TREATMENT                                                             DATE:  12/04/2022 Therex; UBE LE only ROM lvl 3.0 8 mins, seat 8 Knee extension machine double leg up, Lt leg lowering slowly 5 lbs 2 x 15  Slantboard stretch 30 sec X 3 (heel on ground) Forward step up 6 inch with Lt hand on bar (like at home going up stairs) x 15 Lateral step up/down 4 inch step x 15 with light hand assist, performed bilterally Supine Lt leg LAQ with 2 sec pause in end ranges in 90 deg hip flexion x 15 Supine hamstring stretch c strap Lt 15 sec x 3  Vasopneumatic Room temp water 10 mins Lt knee in elevation medium compression   TODAY'S TREATMENT                                                             DATE:  11/30/2022 Therex; UBE LE only ROM lvl 2.0 8 mins, seat 9 Leg press double leg 87 lbs x 20, single leg 37 lbs 2 x 15 performed bilaterally- slow lowering focus Slantboard stretch 30 sec X 3 (heel on ground) Forward step up 6 inch with Lt hand on bar (like at home going up stairs) x 15  bilateral Lateral step up/down 4 inch step x 15 with light hand assist, performed bilterally Knee extension machine double leg up, Lt leg lowering slowly 10 lbs 2 x 10   Neuro Re-ed Tandem stance 1 min x 1 bilateral  SLS c contralateral leg clear over 6 inch hurdle x 15 bilateral   Vasopneumatic Room temp water 10 mins Lt knee in elevation medium compression   TODAY'S TREATMENT  DATE:  11/27/2022 Therex; Nustep lvl 6 10 mins UE/LE Leg press double leg 75 lbs x 25, single leg Lt 31 lbs 2 x 15 - slow lowering focus Slantboard stretch 30 sec X 3 (heel on ground) Forward step up light hand assist 4 inch x 15 WB on Lt leg Lateral step up/down 4 inch step x 15 with light hand assist, performed bilterally   Neuro Re-ed Tandem stance 1 min x 1 bilateral  SLS with contralateral leg touching black mat corners x 6 each, performed bilaterally  Vasopneumatic Room temp water 10 mins Lt knee in elevation medium compression    PATIENT EDUCATION:  12/11/2022  Education details: rationale for interventions, HEP  Person educated: Patient Education method: Programmer, multimedia, Facilities manager, Verbal cues, and Handouts Education comprehension: verbalized understanding, returned demonstration, and verbal cues required  HOME EXERCISE PROGRAM: Access Code: [email or web address] URL: [email or web address] Date: 11/15/2022 Prepared by: Chyrel Masson  Exercises - Supine Heel Slide (Mirrored)  - 3-5 x daily - 7 x weekly - 1 sets - 10 reps - 5 hold - Supine Quadricep Sets  - 3-5 x daily - 7 x weekly - 1 sets - 10 reps - 5 hold - Seated Long Arc Quad (Mirrored)  - 3-5 x daily - 7 x weekly - 1 sets - 5-10 reps - 2 hold - Seated Quad Set (Mirrored)  - 3-5 x daily - 7 x weekly - 1 sets - 10 reps - 5 hold - Sit to Stand  - 3 x daily - 7 x weekly - 1 sets - 10 reps  ASSESSMENT:  CLINICAL IMPRESSION: FOTO reassessment showed marked improvement.   Improving stair navigation but hand assist still required for bilateral LE.  Pt making good progression towards HEP transitioning.   OBJECTIVE IMPAIRMENTS: Abnormal gait, decreased activity tolerance, decreased balance, decreased coordination, decreased endurance, decreased mobility, difficulty walking, decreased ROM, decreased strength, hypomobility, increased edema, increased fascial restrictions, impaired perceived functional ability, increased muscle spasms, impaired flexibility, improper body mechanics, and pain.   ACTIVITY LIMITATIONS: carrying, lifting, bending, sitting, standing, squatting, sleeping, stairs, transfers, bed mobility, and locomotion level  PARTICIPATION LIMITATIONS: meal prep, cleaning, laundry, interpersonal relationship, driving, shopping, and community activity  PERSONAL FACTORS:  s/p right knee replacement revision on 05/07/20, TKA was 7 years ago. 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  are also affecting patient's functional outcome.   REHAB POTENTIAL: Good  CLINICAL DECISION MAKING: Stable/uncomplicated  EVALUATION COMPLEXITY: Low   GOALS: Goals reviewed with patient? Yes  SHORT TERM GOALS: (target date for Short term goals are 3 weeks 12/06/2022)   1.  Patient will demonstrate independent use of home exercise program to maintain progress from in clinic treatments.  Goal status: Met   LONG TERM GOALS: (target dates for all long term goals are 10 weeks  01/24/2023 )   1. Patient will demonstrate/report pain at worst less than or equal to 2/10 to facilitate minimal limitation in daily activity secondary to pain symptoms.  Goal status: on going 12/04/2022   2. Patient will demonstrate independent use of home exercise program to facilitate ability to maintain/progress functional gains from skilled physical therapy services.  Goal status: on going 12/04/2022   3. Patient will demonstrate FOTO outcome > or = 61 % to indicate reduced disability due to  condition.  Goal status: Met 12/11/2022   4.  Patient will demonstrate Lt  LE MMT 5/5 throughout to faciltiate usual transfers, stairs, squatting at [facility] for daily life.   Goal status:  Met 12/04/2022   5.  Patient will demonstrate Lt knee 0-110 AROM to facilitate usual transfers, ambulation and other mobility of daily life.  Goal status: Mostly met 12/04/2022   6.  Patient will demonstrate ascending/descending stairs reciprocally s UE assist for community integration.   Goal status: on going 12/04/2022   7.  Pt will demonstrate independent ambulation community distance > 500 ft s limitation due to symptoms.  Goal Status: on going 12/04/2022   PLAN:  PT FREQUENCY: 1-2x/week  PT DURATION: 10 weeks  PLANNED INTERVENTIONS: Therapeutic exercises, Therapeutic activity, Neuro Muscular re-education, Balance training, Gait training, Patient/Family education, Joint mobilization, Stair training, DME instructions, Dry Needling, Electrical stimulation, Traction, Cryotherapy, vasopneumatic deviceMoist heat, Taping, Ultrasound, Ionotophoresis 4mg /ml Dexamethasone, and aquatic therapy, Manual therapy.  All included unless contraindicated  PLAN FOR NEXT SESSION: Recheck ROM/strength, possible HEP transitioning.    Chyrel Masson, PT, DPT, OCS, ATC 12/11/22  10:50 AM

## 2022-12-11 NOTE — Progress Notes (Signed)
Placed this order since radiology has called Sheryle Hail, CMA for corrected order. Initially ordered CT adrenal abd without for further evaluation of an adrenal nodule. Radiology is requesting an order for CT abd with and with out contrast.

## 2022-12-12 NOTE — Telephone Encounter (Signed)
Thank you :)

## 2022-12-13 ENCOUNTER — Encounter: Payer: Medicare Other | Admitting: Rehabilitative and Restorative Service Providers"

## 2022-12-19 ENCOUNTER — Ambulatory Visit: Payer: Medicare Other | Admitting: Bariatrics

## 2022-12-19 ENCOUNTER — Encounter: Payer: Self-pay | Admitting: Bariatrics

## 2022-12-19 VITALS — BP 164/139 | HR 78 | Temp 97.9°F | Ht 62.0 in | Wt 191.0 lb

## 2022-12-19 DIAGNOSIS — E669 Obesity, unspecified: Secondary | ICD-10-CM | POA: Diagnosis not present

## 2022-12-19 DIAGNOSIS — Z6834 Body mass index (BMI) 34.0-34.9, adult: Secondary | ICD-10-CM

## 2022-12-19 DIAGNOSIS — E039 Hypothyroidism, unspecified: Secondary | ICD-10-CM

## 2022-12-19 DIAGNOSIS — E038 Other specified hypothyroidism: Secondary | ICD-10-CM

## 2022-12-19 NOTE — Progress Notes (Signed)
   WEIGHT SUMMARY AND BIOMETRICS  Weight Lost Since Last Visit: 5lb  Vitals Temp: 97.9 F (36.6 C) BP: (!) 164/139 Pulse Rate: 78 SpO2: 99 %   Anthropometric Measurements Height: 5\' 2"  (1.575 m) Weight: 191 lb (86.6 kg) BMI (Calculated): 34.93 Weight at Last Visit: 196lb Weight Lost Since Last Visit: 5lb Starting Weight: 241lb Total Weight Loss (lbs): 50 lb (22.7 kg)   Body Composition  Body Fat %: 43 % Fat Mass (lbs): 82.4 lbs Muscle Mass (lbs): 103.8 lbs Total Body Water (lbs): 73.8 lbs Visceral Fat Rating : 14   Other Clinical Data Fasting: no Labs: no Today's Visit #: 20 Starting Date: 08/13/18    OBESITY Rosealie is here to discuss her progress with her obesity treatment plan along with follow-up of her obesity related diagnoses.     Nutrition Plan: the Category 3 plan - 50% adherence.  Current exercise: none  Interim History:  She is down 5 lbs since her last visit. She is drinking less water as she has to get up to urinate at night.  Eating all of the food on the plan., Protein intake is less than prescribed., and Water intake is inadequate.  Hunger is moderately controlled.  Cravings are moderately controlled.  Assessment/Plan:   Hypothyroidism Stable.  Does not report symptoms associated with uncontrolled hypothyroidism. Medication(s): Levothyroxine 75 mcg daily. Lab Results  Component Value Date   TSH 1.17 02/08/2022    Plan: Continue levothyroxine at current dose. Counseling: The correct way to take levothyroxine is fasting, with water, separated by at least 30 minutes from breakfast, and separated by more than 4 hours from calcium, iron, multivitamins, acid reflux medications (PPIs).    Generalized Obesity: Current BMI BMI (Calculated): 34.93  Will increase her water and protein.   She will make better choices.   Michaelina is currently in the action stage of change. As such, her goal is to continue with weight loss efforts.   She has agreed to the Category 3 plan.  Exercise goals: Older adults should follow the adult guidelines. When older adults cannot meet the adult guidelines, they should be as physically active as their abilities and conditions will allow.  She will start " Silver Sneakers" for exercise.  She is recovering from knee surgery and doing well.  Behavioral modification strategies: increasing lean protein intake, meal planning , better snacking choices, increasing vegetables, and mindful eating.  Zaraya has agreed to follow-up with our clinic in 4 weeks.      Objective:   VITALS: Per patient if applicable, see vitals. GENERAL: Alert and in no acute distress. CARDIOPULMONARY: No increased WOB. Speaking in clear sentences.  PSYCH: Pleasant and cooperative. Speech normal rate and rhythm. Affect is appropriate. Insight and judgement are appropriate. Attention is focused, linear, and appropriate.  NEURO: Oriented as arrived to appointment on time with no prompting.   Attestation Statements:   This was prepared with the assistance of Engineer, civil (consulting).  Occasional wrong-word or sound-a-like substitutions may have occurred due to the inherent limitations of voice recognition software.   Corinna Capra, DO

## 2022-12-21 ENCOUNTER — Ambulatory Visit (HOSPITAL_BASED_OUTPATIENT_CLINIC_OR_DEPARTMENT_OTHER)
Admission: RE | Admit: 2022-12-21 | Discharge: 2022-12-21 | Disposition: A | Discharge status: Home / Self Care | Payer: Medicare Other | Source: Ambulatory Visit | Attending: Family Medicine | Admitting: Family Medicine

## 2022-12-21 DIAGNOSIS — E278 Other specified disorders of adrenal gland: Secondary | ICD-10-CM

## 2022-12-21 DIAGNOSIS — E041 Nontoxic single thyroid nodule: Secondary | ICD-10-CM | POA: Diagnosis not present

## 2022-12-21 DIAGNOSIS — K5710 Diverticulosis of small intestine without perforation or abscess without bleeding: Secondary | ICD-10-CM | POA: Diagnosis not present

## 2022-12-21 DIAGNOSIS — R9389 Abnormal findings on diagnostic imaging of other specified body structures: Secondary | ICD-10-CM | POA: Insufficient documentation

## 2022-12-21 DIAGNOSIS — K449 Diaphragmatic hernia without obstruction or gangrene: Secondary | ICD-10-CM | POA: Diagnosis not present

## 2022-12-21 MED ORDER — IOHEXOL 350 MG/ML SOLN
100.0000 mL | Freq: Once | INTRAVENOUS | Status: AC | PRN
  Administered 2022-12-21: 100 mL via INTRAVENOUS

## 2022-12-22 ENCOUNTER — Ambulatory Visit (INDEPENDENT_AMBULATORY_CARE_PROVIDER_SITE_OTHER): Payer: Medicare Other | Admitting: Rehabilitative and Restorative Service Providers"

## 2022-12-22 ENCOUNTER — Encounter: Payer: Self-pay | Admitting: Rehabilitative and Restorative Service Providers"

## 2022-12-22 DIAGNOSIS — G8929 Other chronic pain: Secondary | ICD-10-CM | POA: Diagnosis not present

## 2022-12-22 DIAGNOSIS — R262 Difficulty in walking, not elsewhere classified: Secondary | ICD-10-CM

## 2022-12-22 DIAGNOSIS — M6281 Muscle weakness (generalized): Secondary | ICD-10-CM

## 2022-12-22 DIAGNOSIS — R600 Localized edema: Secondary | ICD-10-CM | POA: Diagnosis not present

## 2022-12-22 DIAGNOSIS — M25562 Pain in left knee: Secondary | ICD-10-CM | POA: Diagnosis not present

## 2022-12-22 NOTE — Therapy (Addendum)
OUTPATIENT PHYSICAL THERAPY TREATMENT NOTE /DISCHARGE   Patient Name: Vanessa Christensen MRN: [id number] DOB:January 23, 1951, 72 y.o., female Today's Date: 12/22/2022  END OF SESSION:  PT End of Session - 12/22/22 1026     Visit Number 8    Number of Visits 20    Date for PT Re-Evaluation 01/24/23    Authorization Type Medicare/ BCBS Federal    Progress Note Due on Visit 16    PT Start Time 1023    PT Stop Time 1048    PT Time Calculation (min) 25 min    Activity Tolerance Patient tolerated treatment well    Behavior During Therapy WFL for tasks assessed/performed               Past Medical History:  Diagnosis Date   Anemia    hx of  iron deficient   Anxiety    pt denies   Arthritis    hands and feet   Cancer (HCC) 10/04/2006   breast    left   Cataracts, both eyes    Depression    Depression    Diverticulosis of colon    GERD (gastroesophageal reflux disease)    Glaucoma    Hypertension    no meds   Joint pain    Knee pain    Obesity    OSA on CPAP    Osteoarthritis    Sleep apnea    no CPAP- no longer needed d/t weight loss   Thyroid activity decreased    Past Surgical History:  Procedure Laterality Date   BREAST SURGERY Bilateral    CATARACT EXTRACTION     COLONOSCOPY     GLAUCOMA REPAIR     MASTECTOMY, RADICAL Bilateral    neulasta induced sterile abscesses     THYROIDECTOMY, PARTIAL     TONSILLECTOMY     TOTAL KNEE ARTHROPLASTY Right    TOTAL KNEE ARTHROPLASTY Left 10/27/2022   Procedure: LEFT TOTAL KNEE ARTHROPLASTY;  Surgeon: [name], MD;  Location: [facility];  Service: Orthopedics;  Laterality: Left;   TOTAL KNEE REVISION Right 05/07/2020   Procedure: RIGHT TOTAL KNEE REVISION ARTHROPLASTY;  Surgeon: [name], MD;  Location: [facility];  Service: Orthopedics;  Laterality: Right;   Patient Active Problem List   Diagnosis Date Noted   Adrenal nodule (HCC) 11/16/2022   Thyroid nodule 11/16/2022   Status post total left knee  replacement 10/27/2022   Generalized obesity 10/05/2022   Memory loss 06/12/2022   Essential hypertension 04/13/2022   Morbid obesity (HCC) 04/13/2022   Stress incontinence 11/08/2021   Status post revision of total replacement of right knee 05/07/2020   Failed total knee, right, 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/2020   History of total right knee replacement 01/28/2020   Vitamin D deficiency 08/29/2018   Insulin resistance 08/29/2018   Other specified glaucoma 08/14/2018   Incontinence of urine in female 11/07/2017   Vertigo 11/07/2017   Left knee pain 10/25/2016   Genetic testing 08/08/2016   Hypothyroidism 12/02/2015   Anxiety and depression 12/02/2015   OAB (overactive bladder) 12/02/2015   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/02/2015   BMI 37.0-37.9, adult 12/02/2015    PCP: Sheliah Hatch MD  REFERRING PROVIDER: [name], MD  REFERRING DIAG: [phone] (ICD-10-CM) - Status post total left knee replacement M17.12 (ICD-10-CM) - Unilateral primary osteoarthritis, left knee  THERAPY DIAG:  Chronic pain of left knee  Muscle weakness (generalized)  Difficulty in walking, not elsewhere  classified  Localized edema  Rationale for Evaluation and Treatment: Rehabilitation  ONSET DATE: 10/27/2022 surgery  SUBJECTIVE:   SUBJECTIVE STATEMENT: Pt indicated she thought today would be a good day to try to shift to HEP.  Global rating of change +7 a very great deal better.   Reported pain comes and goes at times but nothing major.    PERTINENT HISTORY: s/p right knee replacement revsision on 05/07/20. 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 History of Lt breast cancer, depression, GERD, HTN, obesity, OA, sleep apnea   PAIN:  NPRS scale:"very mild pain"  Pain location: Lt knee Pain description: grabs/spikes Aggravating factors: random  Relieving factors: heat  PRECAUTIONS: None  WEIGHT BEARING RESTRICTIONS: No  FALLS:   Has patient fallen in last 6 months? 1 fall in last 6 months over dog.   Reported no fear of falling.   LIVING ENVIRONMENT:  Lives in: House/apartment Stairs: stairs to enter with rails on both sides .  Full flight but not to bedroom or normal use areas.  Has following equipment at home: [facility], walker  OCCUPATION: Retired  PLOF: Independent  PATIENT GOALS: Reduce pain, improve walking  OBJECTIVE: (objective measures completed at initial evaluation unless otherwise dated)   PATIENT SURVEYS:  12/11/2022:  FOTO update:  69  11/15/2022 FOTO intake: 51   predicted:  61  COGNITION: 11/15/2022 Overall cognitive status: WFL    SENSATION: 11/15/2022 No specific testing.   EDEMA:  11/15/2022 Visible edema in Lt knee, LE distally  MUSCLE LENGTH: 11/15/2022 No specific testing  POSTURE:  11/15/2022 No Significant postural limitations  PALPATION: 11/15/2022 Tenderness Lt quad, incision area.   Steristrips still on.   LOWER EXTREMITY ROM:   ROM Right 11/15/2022 Left 11/15/2022 Left 11/27/2022 Left 12/04/2022  Hip flexion      Hip extension      Hip abduction      Hip adduction      Hip internal rotation      Hip external rotation      Knee flexion  107 AROM in supine heel slide 115 AROM in supine heel slide  120 AROM in supine heel slide  Knee extension  -5 AROM in seated LAQ    -2 in seated LAQ AROM  Ankle dorsiflexion      Ankle plantarflexion      Ankle inversion      Ankle eversion       (Blank rows = not tested)  LOWER EXTREMITY MMT:  MMT Right 11/15/2022 Left 11/15/2022 Right 12/04/2022 Left 12/04/2022 Left 12/22/2022  Hip flexion 5/5 5/5     Hip extension       Hip abduction       Hip adduction       Hip internal rotation       Hip external rotation       Knee flexion 5/5 5/5     Knee extension 5/5 4/5 5/5 40, 38.1 lbs 5/5 37.9, 35 lbs   Ankle dorsiflexion 5/5 4/5     Ankle plantarflexion       Ankle inversion       Ankle eversion        (Blank rows =  not tested)  SPECIAL TESTS:  11/15/2022 No specific testing  FUNCTIONAL TESTS:   11/15/2022 18 inch chair transfer: unable without UE assist  Lt SLS: unable on Lt    GAIT: 12/22/2022:  Arrived with independent ambulation, mild decrease in gait speed with increased width of base of support.  12/04/2022:  Arrived with [facility] but able to walk independently.   11/15/2022 SPC in Rt UE with grabbing on to clinician with Lt arm.  Step to to reduced step length patterns.  Maintained knee flexion in stance.                       TODAY'S TREATMENT                                                             DATE:  12/22/2022 Therex; UBE LE only ROM lvl 3.0 8 mins, seat 10 Leg press Double leg 87 lbs x 20, single leg 2 x 15 37 lbs performed bilaterally   Review of HEP and updates for continued progression.   Handout provided c updates.     TODAY'S TREATMENT                                                             DATE:  12/11/2022 Therex; UBE LE only ROM lvl 3.0 12 mins, seat 8 Leg press Double leg 87 lbs x 15, single leg 2 x 15 performed bilaterally  Supine Lt leg hip extension straight leg 10 sec hold x 5 Supine Lt leg hamstring stretch 30 sec x 3 Incline gastroc stretch 30 sec x 3   TherActivity Ascending/descending flight of stairs with Rt handrail going down, Lt going up to mimic house navigation with reciprocal gait pattern.    TODAY'S TREATMENT                                                             DATE:  12/04/2022 Therex; UBE LE only ROM lvl 3.0 8 mins, seat 8 Knee extension machine double leg up, Lt leg lowering slowly 5 lbs 2 x 15  Slantboard stretch 30 sec X 3 (heel on ground) Forward step up 6 inch with Lt hand on bar (like at home going up stairs) x 15 Lateral step up/down 4 inch step x 15 with light hand assist, performed bilterally Supine Lt leg LAQ with 2 sec pause in end ranges in 90 deg hip flexion x 15 Supine hamstring stretch c strap Lt 15 sec x  3  Vasopneumatic Room temp water 10 mins Lt knee in elevation medium compression   TODAY'S TREATMENT                                                             DATE:  11/30/2022 Therex; UBE LE only ROM lvl 2.0 8 mins, seat 9 Leg press double leg 87 lbs x 20, single leg 37 lbs 2 x 15 performed bilaterally- slow lowering focus Slantboard stretch 30 sec X 3 (heel  on ground) Forward step up 6 inch with Lt hand on bar (like at home going up stairs) x 15 bilateral Lateral step up/down 4 inch step x 15 with light hand assist, performed bilterally Knee extension machine double leg up, Lt leg lowering slowly 10 lbs 2 x 10   Neuro Re-ed Tandem stance 1 min x 1 bilateral  SLS c contralateral leg clear over 6 inch hurdle x 15 bilateral   Vasopneumatic Room temp water 10 mins Lt knee in elevation medium compression     PATIENT EDUCATION:  12/22/2022  Education details: rationale for interventions, HEP  Person educated: Patient Education method: Programmer, multimedia, Facilities manager, Verbal cues, and Handouts Education comprehension: verbalized understanding, returned demonstration, and verbal cues required  HOME EXERCISE PROGRAM: Access Code: [email or web address] URL: [email or web address] Date: 12/22/2022 Prepared by: Chyrel Masson  Exercises - Supine Heel Slide (Mirrored)  - 3-5 x daily - 7 x weekly - 1 sets - 10 reps - 5 hold - Seated Long Arc Quad (Mirrored)  - 3-5 x daily - 7 x weekly - 1 sets - 5-10 reps - 2 hold - Seated Quad Set (Mirrored)  - 3-5 x daily - 7 x weekly - 1 sets - 10 reps - 5 hold - Sit to Stand  - 3 x daily - 7 x weekly - 1 sets - 10 reps - Seated Straight Leg Heel Taps  - 1-2 x daily - 7 x weekly - 3 sets - 10 reps - Heel Toe Raises with Counter Support  - 1-2 x daily - 7 x weekly - 1-2 sets - 10-15 reps - Tandem Stance  - 1 x daily - 7 x weekly - 1 sets - 3-5 reps - 30 hold  ASSESSMENT:  CLINICAL IMPRESSION: Pt has attended 8 visits overall during treatment.  Due to  overall improvements and in concert with Pt request, plan was made to transition to HEP at this time.  Good knowledge of HEP.  Pt indicated plans to move into silver sneakers plan for gym workouts as well.     Decreased treatment time today due to arrival time and HEP transitioning plan.   OBJECTIVE IMPAIRMENTS: Abnormal gait, decreased activity tolerance, decreased balance, decreased coordination, decreased endurance, decreased mobility, difficulty walking, decreased ROM, decreased strength, hypomobility, increased edema, increased fascial restrictions, impaired perceived functional ability, increased muscle spasms, impaired flexibility, improper body mechanics, and pain.   ACTIVITY LIMITATIONS: carrying, lifting, bending, sitting, standing, squatting, sleeping, stairs, transfers, bed mobility, and locomotion level  PARTICIPATION LIMITATIONS: meal prep, cleaning, laundry, interpersonal relationship, driving, shopping, and community activity  PERSONAL FACTORS:  s/p right knee replacement revision on 05/07/20, TKA was 7 years ago. 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  are also affecting patient's functional outcome.   REHAB POTENTIAL: Good  CLINICAL DECISION MAKING: Stable/uncomplicated  EVALUATION COMPLEXITY: Low   GOALS: Goals reviewed with patient? Yes  SHORT TERM GOALS: (target date for Short term goals are 3 weeks 12/06/2022)   1.  Patient will demonstrate independent use of home exercise program to maintain progress from in clinic treatments.  Goal status: Met   LONG TERM GOALS: (target dates for all long term goals are 10 weeks  01/24/2023 )   1. Patient will demonstrate/report pain at worst less than or equal to 2/10 to facilitate minimal limitation in daily activity secondary to pain symptoms.  Goal status: Met 12/22/2022   2. Patient will demonstrate independent use of home exercise program to facilitate ability to maintain/progress functional gains  from skilled  physical therapy services.  Goal status:Met 12/22/2022   3. Patient will demonstrate FOTO outcome > or = 61 % to indicate reduced disability due to condition.  Goal status: Met 12/11/2022   4.  Patient will demonstrate Lt  LE MMT 5/5 throughout to faciltiate usual transfers, stairs, squatting at [facility] for daily life.   Goal status: Met 12/04/2022   5.  Patient will demonstrate Lt knee 0-110 AROM to facilitate usual transfers, ambulation and other mobility of daily life.  Goal status: Mostly met 12/04/2022   6.  Patient will demonstrate ascending/descending stairs reciprocally s UE assist for community integration.   Goal status:  Mostly Met 12/22/2022   7.  Pt will demonstrate independent ambulation community distance > 500 ft s limitation due to symptoms.  Goal Status:Met 12/22/2022   PLAN:  PT FREQUENCY: 1-2x/week  PT DURATION: 10 weeks  PLANNED INTERVENTIONS: Therapeutic exercises, Therapeutic activity, Neuro Muscular re-education, Balance training, Gait training, Patient/Family education, Joint mobilization, Stair training, DME instructions, Dry Needling, Electrical stimulation, Traction, Cryotherapy, vasopneumatic deviceMoist heat, Taping, Ultrasound, Ionotophoresis 4mg /ml Dexamethasone, and aquatic therapy, Manual therapy.  All included unless contraindicated  PLAN FOR NEXT SESSION: HEP trial.    Chyrel Masson, PT, DPT, OCS, ATC 12/22/22  10:46 AM    PHYSICAL THERAPY DISCHARGE SUMMARY  Visits from Start of Care: 8  Current functional level related to goals / functional outcomes: See note   Remaining deficits: See note   Education / Equipment: HEP  Patient goals were met. Patient is being discharged due to being pleased with the current functional level.  Chyrel Masson, PT, DPT, OCS, ATC 01/17/23  11:25 AM

## 2022-12-25 ENCOUNTER — Telehealth: Payer: Self-pay

## 2022-12-25 NOTE — Telephone Encounter (Signed)
Left results on pt VM  

## 2022-12-25 NOTE — Telephone Encounter (Signed)
-----   Message from Neena Rhymes sent at 12/25/2022 12:33 PM EDT ----- No thyroid nodule was seen on ultrasound- great news!  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

## 2023-01-01 ENCOUNTER — Other Ambulatory Visit: Payer: Self-pay | Admitting: Family Medicine

## 2023-01-02 ENCOUNTER — Telehealth: Payer: Self-pay

## 2023-01-02 NOTE — Telephone Encounter (Signed)
-----   Message from Zandra Abts sent at 01/02/2023 10:29 AM EDT ----- Your CT abdomen scan shows a 12mm nodule on the left adrenal gland that appears to be fatty rich benign nodule and does not require further imaging. Also, you have a small hiatal hernia and they can see plaque on your arteries. I am sending a copy of this scan to Dr. Beverely Low so she will be aware of this information.

## 2023-01-15 ENCOUNTER — Other Ambulatory Visit: Payer: Self-pay | Admitting: Family Medicine

## 2023-01-15 ENCOUNTER — Ambulatory Visit: Payer: Medicare Other | Admitting: Family Medicine

## 2023-01-15 ENCOUNTER — Encounter: Payer: Self-pay | Admitting: Family Medicine

## 2023-01-15 VITALS — BP 114/74 | HR 72 | Temp 98.1°F | Wt 196.2 lb

## 2023-01-15 DIAGNOSIS — E038 Other specified hypothyroidism: Secondary | ICD-10-CM | POA: Diagnosis not present

## 2023-01-15 DIAGNOSIS — E559 Vitamin D deficiency, unspecified: Secondary | ICD-10-CM

## 2023-01-15 DIAGNOSIS — E669 Obesity, unspecified: Secondary | ICD-10-CM

## 2023-01-15 DIAGNOSIS — N3281 Overactive bladder: Secondary | ICD-10-CM | POA: Diagnosis not present

## 2023-01-15 DIAGNOSIS — Z6835 Body mass index (BMI) 35.0-35.9, adult: Secondary | ICD-10-CM | POA: Diagnosis not present

## 2023-01-15 LAB — CBC WITH DIFFERENTIAL/PLATELET
Basophils Absolute: 0 10*3/uL (ref 0.0–0.1)
Basophils Relative: 0.7 % (ref 0.0–3.0)
Eosinophils Absolute: 0 10*3/uL (ref 0.0–0.7)
Eosinophils Relative: 0.1 % (ref 0.0–5.0)
HCT: 38.8 % (ref 36.0–46.0)
Hemoglobin: 12.5 g/dL (ref 12.0–15.0)
Lymphocytes Relative: 30.5 % (ref 12.0–46.0)
Lymphs Abs: 1 10*3/uL (ref 0.7–4.0)
MCHC: 32.2 g/dL (ref 30.0–36.0)
MCV: 87.2 fl (ref 78.0–100.0)
Monocytes Absolute: 0.6 10*3/uL (ref 0.1–1.0)
Monocytes Relative: 19.3 % — ABNORMAL HIGH (ref 3.0–12.0)
Neutro Abs: 1.5 10*3/uL (ref 1.4–7.7)
Neutrophils Relative %: 49.4 % (ref 43.0–77.0)
Platelets: 164 10*3/uL (ref 150.0–400.0)
RBC: 4.45 Mil/uL (ref 3.87–5.11)
RDW: 15 % (ref 11.5–15.5)
WBC: 3.1 10*3/uL — ABNORMAL LOW (ref 4.0–10.5)

## 2023-01-15 LAB — BASIC METABOLIC PANEL
BUN: 18 mg/dL (ref 6–23)
CO2: 26 mEq/L (ref 19–32)
Calcium: 9.7 mg/dL (ref 8.4–10.5)
Chloride: 104 mEq/L (ref 96–112)
Creatinine, Ser: 0.78 mg/dL (ref 0.40–1.20)
GFR: 74.97 mL/min (ref >60.00)
Glucose, Bld: 90 mg/dL (ref 70–99)
Potassium: 4.3 mEq/L (ref 3.5–5.1)
Sodium: 137 mEq/L (ref 135–145)

## 2023-01-15 LAB — HEPATIC FUNCTION PANEL
ALT: 28 U/L (ref 0–35)
AST: 26 U/L (ref 0–37)
Albumin: 4.5 g/dL (ref 3.5–5.2)
Alkaline Phosphatase: 60 U/L (ref 39–117)
Bilirubin, Direct: 0.1 mg/dL (ref 0.0–0.3)
Total Bilirubin: 0.5 mg/dL (ref 0.2–1.2)
Total Protein: 7 g/dL (ref 6.0–8.3)

## 2023-01-15 LAB — LIPID PANEL
Cholesterol: 180 mg/dL (ref 0–200)
HDL: 52.6 mg/dL (ref >39.00)
LDL Cholesterol: 112 mg/dL — ABNORMAL HIGH (ref 0–99)
NonHDL: 127.06
Total CHOL/HDL Ratio: 3
Triglycerides: 75 mg/dL (ref 0.0–149.0)
VLDL: 15 mg/dL (ref 0.0–40.0)

## 2023-01-15 LAB — VITAMIN D 25 HYDROXY (VIT D DEFICIENCY, FRACTURES): VITD: 43.09 ng/mL (ref 30.00–100.00)

## 2023-01-15 LAB — TSH: TSH: 1.12 u[IU]/mL (ref 0.35–5.50)

## 2023-01-15 NOTE — Assessment & Plan Note (Signed)
Chronic problem.  Currently on Levothyroxine 63mg daily w/o difficulty.  Check labs.  Adjust meds prn

## 2023-01-15 NOTE — Assessment & Plan Note (Signed)
Ongoing issue for pt.  She is frustrated that she is gaining weight but knows that she has been fairly sedentary since her knee replacement.  She has just joined [employer]- which I applauded.  Will continue to follow.

## 2023-01-15 NOTE — Patient Instructions (Signed)
Follow up in 6 months to recheck weight loss, thyroid, and cholesterol We'll notify you of your lab results and make any changes if needed Continue to work on healthy diet and regular exercise- I'm proud of you!!! Call with any questions or concerns Stay Safe!  Stay Healthy! Enjoy the rest of your summer!!!

## 2023-01-15 NOTE — Assessment & Plan Note (Signed)
At last visit was started on Myrbetriq.  She reports this has considerably improved daytime sxs.  Still has to wake 1-2x/night to use the bathroom.  No changes at this time.

## 2023-01-15 NOTE — Progress Notes (Signed)
   Subjective:    Patient ID: Vanessa Christensen, female    DOB: 06-Mar-1951, 72 y.o.   MRN: [id number]  HPI Obesity- pt has gained 5 lbs since July.  Still seeing MWM.  Pt has joined [employer]- went Friday for the first time.  Plans to go M/W/F  Did have aortic atherosclerosis on CT scan.  Hypothyroid- chronic problem, on Levothyroxine daily.  No CP, SOB, HA's, abd pain, N/V.  OAB- started on Myrbetriq at last visit.  Feels that it is helpful during the day but continues to get up 1-2x/night.   Review of Systems For ROS see HPI     Objective:   Physical Exam Vitals reviewed.  Constitutional:      General: She is not in acute distress.    Appearance: Normal appearance. She is well-developed. She is obese. She is not ill-appearing.  HENT:     Head: Normocephalic and atraumatic.  Eyes:     Conjunctiva/sclera: Conjunctivae normal.     Pupils: Pupils are equal, round, and reactive to light.  Neck:     Thyroid: No thyromegaly.  Cardiovascular:     Rate and Rhythm: Normal rate and regular rhythm.     Pulses: Normal pulses.     Heart sounds: Normal heart sounds. No murmur heard. Pulmonary:     Effort: Pulmonary effort is normal. No respiratory distress.     Breath sounds: Normal breath sounds.  Abdominal:     General: There is no distension.     Palpations: Abdomen is soft.     Tenderness: There is no abdominal tenderness.  Musculoskeletal:     Cervical back: Normal range of motion and neck supple.     Right lower leg: No edema.     Left lower leg: Edema (trace- due to TKR) present.  Lymphadenopathy:     Cervical: No cervical adenopathy.  Skin:    General: Skin is warm and dry.  Neurological:     Mental Status: She is alert and oriented to person, place, and time.  Psychiatric:        Behavior: Behavior normal.           Assessment & Plan:

## 2023-01-16 ENCOUNTER — Ambulatory Visit (INDEPENDENT_AMBULATORY_CARE_PROVIDER_SITE_OTHER): Payer: Medicare Other | Admitting: Bariatrics

## 2023-01-16 VITALS — BP 130/74 | HR 85 | Temp 98.7°F | Ht 62.0 in | Wt 193.0 lb

## 2023-01-16 DIAGNOSIS — I10 Essential (primary) hypertension: Secondary | ICD-10-CM

## 2023-01-16 DIAGNOSIS — Z6835 Body mass index (BMI) 35.0-35.9, adult: Secondary | ICD-10-CM | POA: Diagnosis not present

## 2023-01-16 DIAGNOSIS — E669 Obesity, unspecified: Secondary | ICD-10-CM

## 2023-01-17 ENCOUNTER — Encounter: Payer: Self-pay | Admitting: Bariatrics

## 2023-01-17 NOTE — Progress Notes (Signed)
Chief Complaint:   OBESITY Vanessa Christensen is here to discuss her progress with her obesity treatment plan along with follow-up of her obesity related diagnoses. Malaree is on the Category 3 Plan and states she is following her eating plan approximately 40% of the time. Bayleigh states she is doing stretch class and exercising with a trainer for 40-45 minutes 2-3 times per week.  Today's visit was #: 70 Starting weight: 241 lbs Starting date: 08/13/2018 Today's weight: 193 lbs Today's date: 01/16/2023 Total lbs lost to date: 48 Total lbs lost since last in-office visit: 0  Interim History: Patient is up 2 pounds since her last visit.  She is trying to stay away from processed foods and certain snack foods.  She is doing well with her protein and increasing her water.  Subjective:   1. Essential hypertension Patient is not on medications.  Her blood pressure was slightly elevated systolically 168/75, 130/74.  Her blood pressure yesterday was 114/74.  Assessment/Plan:   1. Essential hypertension Patient is to work on eliminating salt, and we will continue to monitor.  2. Generalized obesity  3. BMI 35.0-35.9,adult Ednamae is currently in the action stage of change. As such, her goal is to continue with weight loss efforts. She has agreed to the Category 3 Plan.   Patient will adhere to the plan 85-95%.  Meal planning was discussed.  Review recipes.  Patient is to stay away from processed foods.  Continue to use her CPAP.  Exercise goals: As is.   Behavioral modification strategies: increasing lean protein intake, decreasing simple carbohydrates, increasing vegetables, increasing water intake, ways to avoid boredom eating, ways to avoid night time snacking, better snacking choices, emotional eating strategies, planning for success, keeping a strict food journal, and decreasing junk food.  Vanessa Christensen has agreed to follow-up with our clinic in 4 weeks. She was informed of the importance of  frequent follow-up visits to maximize her success with intensive lifestyle modifications for her multiple health conditions.   Objective:   Blood pressure 130/74, pulse 85, temperature 98.7 F (37.1 C), temperature source Oral, height 5\' 2"  (1.575 m), weight 193 lb (87.5 kg), SpO2 96%. Body mass index is 35.3 kg/m.  General: Cooperative, alert, well developed, in no acute distress. HEENT: Conjunctivae and lids unremarkable. Cardiovascular: Regular rhythm.  Lungs: Normal work of breathing. Neurologic: No focal deficits.   Lab Results  Component Value Date   CREATININE 0.78 01/15/2023   BUN 18 01/15/2023   NA 137 01/15/2023   K 4.3 01/15/2023   CL 104 01/15/2023   CO2 26 01/15/2023   Lab Results  Component Value Date   ALT 28 01/15/2023   AST 26 01/15/2023   ALKPHOS 60 01/15/2023   BILITOT 0.5 01/15/2023   Lab Results  Component Value Date   HGBA1C 5.2 12/08/2020   HGBA1C 5.6 01/15/2020   HGBA1C 5.4 11/04/2018   HGBA1C 5.6 08/13/2018   Lab Results  Component Value Date   INSULIN 4.7 12/08/2020   INSULIN 8.0 01/15/2020   INSULIN 10.9 11/04/2018   INSULIN 13.2 08/13/2018   Lab Results  Component Value Date   TSH 1.12 01/15/2023   Lab Results  Component Value Date   CHOL 180 01/15/2023   HDL 52.60 01/15/2023   LDLCALC 112 (H) 01/15/2023   TRIG 75.0 01/15/2023   CHOLHDL 3 01/15/2023   Lab Results  Component Value Date   VD25OH 43.09 01/15/2023   VD25OH 32.84 07/27/2021   VD25OH 74.7 12/08/2020  Lab Results  Component Value Date   WBC 3.1 (L) 01/15/2023   HGB 12.5 01/15/2023   HCT 38.8 01/15/2023   MCV 87.2 01/15/2023   PLT 164.0 01/15/2023   Lab Results  Component Value Date   IRON 57 07/31/2022   TIBC 484 (H) 07/31/2022   FERRITIN 18 07/31/2022   Attestation Statements:   Reviewed by clinician on day of visit: allergies, medications, problem list, medical history, surgical history, family history, social history, and previous encounter  notes.   Trude Mcburney, am acting as Energy manager for [employer], DO.  I have reviewed the above documentation for accuracy and completeness, and I agree with the above. Corinna Capra, DO

## 2023-01-22 ENCOUNTER — Telehealth: Payer: Self-pay

## 2023-01-22 ENCOUNTER — Other Ambulatory Visit: Payer: Self-pay

## 2023-01-22 DIAGNOSIS — E785 Hyperlipidemia, unspecified: Secondary | ICD-10-CM

## 2023-01-22 MED ORDER — ROSUVASTATIN CALCIUM 10 MG PO TABS
10.0000 mg | ORAL_TABLET | Freq: Every day | ORAL | 3 refills | Status: DC
Start: 2023-01-22 — End: 2023-03-12

## 2023-01-22 NOTE — Telephone Encounter (Signed)
Left VM for pt to call office . Sent in Crestor 10 mg to pharmacy and repeat liver function order is in . Pt will need a 6 week lab only apt

## 2023-01-22 NOTE — Telephone Encounter (Signed)
-----   Message from Neena Rhymes sent at 01/22/2023  8:38 AM EDT ----- Labs are stable and look good!  But considering the CT scan showed some atherosclerosis (plaque) in the aorta, I recommend we start a low dose cholesterol medication.  We'll start Crestor 10mg  nightly (#30, 3 refills) while continuing to work on [employer] and regular exercise.  We will repeat your liver functions at a lab only visit in 6 weeks to make sure the medication is being metabolized appropriately (dx hyperlipidemia)

## 2023-01-23 NOTE — Telephone Encounter (Signed)
Left VM for pt to call office

## 2023-01-24 ENCOUNTER — Other Ambulatory Visit: Payer: Self-pay | Admitting: Family Medicine

## 2023-01-24 DIAGNOSIS — E785 Hyperlipidemia, unspecified: Secondary | ICD-10-CM

## 2023-02-01 ENCOUNTER — Encounter: Payer: Self-pay | Admitting: Internal Medicine

## 2023-02-14 NOTE — Telephone Encounter (Signed)
done

## 2023-02-15 ENCOUNTER — Ambulatory Visit: Payer: Medicare Other | Admitting: Bariatrics

## 2023-02-26 DIAGNOSIS — Z961 Presence of intraocular lens: Secondary | ICD-10-CM | POA: Diagnosis not present

## 2023-02-26 DIAGNOSIS — H52203 Unspecified astigmatism, bilateral: Secondary | ICD-10-CM | POA: Diagnosis not present

## 2023-02-26 DIAGNOSIS — H04123 Dry eye syndrome of bilateral lacrimal glands: Secondary | ICD-10-CM | POA: Diagnosis not present

## 2023-02-26 DIAGNOSIS — H401131 Primary open-angle glaucoma, bilateral, mild stage: Secondary | ICD-10-CM | POA: Diagnosis not present

## 2023-03-04 ENCOUNTER — Encounter (INDEPENDENT_AMBULATORY_CARE_PROVIDER_SITE_OTHER): Payer: Self-pay

## 2023-03-05 ENCOUNTER — Ambulatory Visit: Payer: Medicare Other | Admitting: Bariatrics

## 2023-03-06 ENCOUNTER — Encounter: Payer: Self-pay | Admitting: Radiology

## 2023-03-06 ENCOUNTER — Telehealth: Payer: Self-pay

## 2023-03-06 NOTE — Telephone Encounter (Signed)
Traci at [employer] would like pre medication note/letter faxed to [phone].  CB# [phone].  Please advise.  Thank you.

## 2023-03-10 ENCOUNTER — Other Ambulatory Visit: Payer: Self-pay | Admitting: Family Medicine

## 2023-03-10 DIAGNOSIS — E785 Hyperlipidemia, unspecified: Secondary | ICD-10-CM

## 2023-03-12 ENCOUNTER — Encounter: Payer: Self-pay | Admitting: Bariatrics

## 2023-03-12 ENCOUNTER — Ambulatory Visit (INDEPENDENT_AMBULATORY_CARE_PROVIDER_SITE_OTHER): Payer: Medicare Other | Admitting: Bariatrics

## 2023-03-12 VITALS — BP 145/96 | HR 71 | Temp 97.6°F | Ht 62.0 in | Wt 194.0 lb

## 2023-03-12 DIAGNOSIS — I10 Essential (primary) hypertension: Secondary | ICD-10-CM

## 2023-03-12 DIAGNOSIS — E669 Obesity, unspecified: Secondary | ICD-10-CM | POA: Diagnosis not present

## 2023-03-12 DIAGNOSIS — Z6835 Body mass index (BMI) 35.0-35.9, adult: Secondary | ICD-10-CM | POA: Diagnosis not present

## 2023-03-12 DIAGNOSIS — R632 Polyphagia: Secondary | ICD-10-CM | POA: Diagnosis not present

## 2023-03-12 DIAGNOSIS — I7090 Unspecified atherosclerosis: Secondary | ICD-10-CM

## 2023-03-12 MED ORDER — WEGOVY 0.25 MG/0.5ML ~~LOC~~ SOAJ
0.2500 mg | SUBCUTANEOUS | 0 refills | Status: DC
Start: 2023-03-12 — End: 2023-06-13

## 2023-03-13 NOTE — Progress Notes (Unsigned)
Chief Complaint:   OBESITY Vanessa Christensen is here to discuss her progress with her obesity treatment plan along with follow-up of her obesity related diagnoses. [name] is on the Category 3 Plan and states she is following her eating plan approximately 40% of the time. [name] states she is at the gym exercising with a personal trainer for 30 minutes 2 times per week.  Today's visit was #: 71 Starting weight: 241 lbs Starting date: 08/13/2018 Today's weight: 194 lbs Today's date: 03/12/2023 Total lbs lost to date: 47 Total lbs lost since last in-office visit: 0  Interim History: Patient is up 1 lb since her last visit. She is having her protein shake in the morning.   Subjective:   1. Essential hypertension Patient's blood pressure is slightly elevated today at 145/96. Her blood pressure is always high, and she is having pain in her right knee.   2. Polyphagia Patient is not on medications currently.   3. Atherosclerosis of artery Patient's diagnosis was found on CT. She denies contraindications. We discussed and reviewed GLP-1 and information sheet.   Assessment/Plan:   1. Essential hypertension Patient will check her blood pressure at home and keep a record.  2. Polyphagia Patient agreed to start Wegovy 0.25 mg once weekly with no refills.   - Semaglutide-Weight Management (WEGOVY) 0.25 MG/0.5ML SOAJ; Inject 0.25 mg into the skin once a week.  Dispense: 2 mL; Refill: 0  3. Atherosclerosis of artery Patient agreed to start Wegovy 0.25 mg once weekly with no refills.   - Semaglutide-Weight Management (WEGOVY) 0.25 MG/0.5ML SOAJ; Inject 0.25 mg into the skin once a week.  Dispense: 2 mL; Refill: 0  4. Generalized obesity  5. BMI 35.0-35.9,adult Vanessa Christensen is currently in the action stage of change. As such, her goal is to continue with weight loss efforts. She has agreed to the Category 3 Plan.   Meal planning and intentional eating were discussed. Patient will have a protein  shake if she is not eating breakfast. Halloween tips was given.   Exercise goals: As is.   Behavioral modification strategies: increasing lean protein intake, decreasing simple carbohydrates, increasing vegetables, increasing water intake, decreasing eating out, no skipping meals, meal planning and cooking strategies, and keeping healthy foods in the home.  [name] has agreed to follow-up with our clinic in 4 to 6 weeks. She was informed of the importance of frequent follow-up visits to maximize her success with intensive lifestyle modifications for her multiple health conditions.   Objective:   Blood pressure (!) 145/96, pulse 71, temperature 97.6 F (36.4 C), height 5\' 2"  (1.575 m), weight 194 lb (88 kg), SpO2 96%. Body mass index is 35.48 kg/m.  General: Cooperative, alert, well developed, in no acute distress. HEENT: Conjunctivae and lids unremarkable. Cardiovascular: Regular rhythm.  Lungs: Normal work of breathing. Neurologic: No focal deficits.   Lab Results  Component Value Date   CREATININE 0.78 01/15/2023   BUN 18 01/15/2023   NA 137 01/15/2023   K 4.3 01/15/2023   CL 104 01/15/2023   CO2 26 01/15/2023   Lab Results  Component Value Date   ALT 28 01/15/2023   AST 26 01/15/2023   ALKPHOS 60 01/15/2023   BILITOT 0.5 01/15/2023   Lab Results  Component Value Date   HGBA1C 5.2 12/08/2020   HGBA1C 5.6 01/15/2020   HGBA1C 5.4 11/04/2018   HGBA1C 5.6 08/13/2018   Lab Results  Component Value Date   INSULIN 4.7 12/08/2020   INSULIN 8.0  01/15/2020   INSULIN 10.9 11/04/2018   INSULIN 13.2 08/13/2018   Lab Results  Component Value Date   TSH 1.12 01/15/2023   Lab Results  Component Value Date   CHOL 180 01/15/2023   HDL 52.60 01/15/2023   LDLCALC 112 (H) 01/15/2023   TRIG 75.0 01/15/2023   CHOLHDL 3 01/15/2023   Lab Results  Component Value Date   VD25OH 43.09 01/15/2023   VD25OH 32.84 07/27/2021   VD25OH 74.7 12/08/2020   Lab Results  Component  Value Date   WBC 3.1 (L) 01/15/2023   HGB 12.5 01/15/2023   HCT 38.8 01/15/2023   MCV 87.2 01/15/2023   PLT 164.0 01/15/2023   Lab Results  Component Value Date   IRON 57 07/31/2022   TIBC 484 (H) 07/31/2022   FERRITIN 18 07/31/2022   Attestation Statements:   Reviewed by clinician on day of visit: allergies, medications, problem list, medical history, surgical history, family history, social history, and previous encounter notes.   Trude Mcburney, am acting as Energy manager for [employer], DO.  I have reviewed the above documentation for accuracy and completeness, and I agree with the above. Corinna Capra, DO

## 2023-04-09 ENCOUNTER — Ambulatory Visit (INDEPENDENT_AMBULATORY_CARE_PROVIDER_SITE_OTHER): Payer: Medicare Other | Admitting: Bariatrics

## 2023-04-09 ENCOUNTER — Encounter: Payer: Self-pay | Admitting: Bariatrics

## 2023-04-09 DIAGNOSIS — E669 Obesity, unspecified: Secondary | ICD-10-CM

## 2023-04-09 DIAGNOSIS — R632 Polyphagia: Secondary | ICD-10-CM

## 2023-04-09 DIAGNOSIS — Z6836 Body mass index (BMI) 36.0-36.9, adult: Secondary | ICD-10-CM

## 2023-04-09 DIAGNOSIS — I7090 Unspecified atherosclerosis: Secondary | ICD-10-CM

## 2023-04-09 NOTE — Progress Notes (Signed)
   WEIGHT SUMMARY AND BIOMETRICS  Weight Lost Since Last Visit: 0  Weight Gained Since Last Visit: 4lb   Vitals Temp: 98.1 F (36.7 C) BP: (!) 146/82 Pulse Rate: 80 SpO2: 98 %   Anthropometric Measurements Height: 5\' 2"  (1.575 m) Weight: 198 lb (89.8 kg) BMI (Calculated): 36.21 Weight at Last Visit: 194lb Weight Lost Since Last Visit: 0 Weight Gained Since Last Visit: 4lb Starting Weight: 241lb Total Weight Loss (lbs): 43 lb (19.5 kg)   Body Composition  Body Fat %: 46.7 % Fat Mass (lbs): 92.8 lbs Muscle Mass (lbs): 100.6 lbs Total Body Water (lbs): 75 lbs Visceral Fat Rating : 15   Other Clinical Data Fasting: no Labs: no Today's Visit #: 72 Starting Date: 08/13/18    OBESITY Vanessa Christensen is here to discuss her progress with her obesity treatment plan along with follow-up of her obesity related diagnoses.   Nutrition Plan: the Category 3 plan - 50% adherence.  Current exercise:  Systems analyst.   Interim History:  She is up about 4 lbs from her last visit. She was having more cravings, but have subsided.  Eating all of the food on the plan., Is not skipping meals, and Meeting protein goals.  Pharmacotherapy: Vanessa Christensen is on no medications.  Adverse side effects: None Hunger is moderately controlled.  Cravings are moderately controlled. She will drink more water (ICE  brand) if needed.  Assessment/Plan:   1. Polyphagia Polyphagia Vanessa Christensen endorses excessive hunger.  Medication(s): Prescribed [id number], but not yet approved.  Effects of medication:  moderately controlled. Cravings are moderately controlled.   Plan: Medication(s): Will do a prior authorization for [facility].  Will increase water, protein and fiber to help assuage hunger.  Will minimize foods that have a high glucose index/load to minimize reactive hypoglycemia.   2. Atherosclerosis of artery  She is taking an ASA 81 mg, and Crestor.   Plan: Continue medications, and continue to work  on her plan and exercise.    Generalized Obesity: Current BMI BMI (Calculated): 36.21   Pharmacotherapy Plan None, trying to get a GLP-1.   Vanessa Christensen is currently in the action stage of change. As such, her goal is to continue with weight loss efforts.  She has agreed to the Category 3 plan.  Exercise goals: Older adults should determine their level of effort for physical activity relative to their level of fitness.  She will continue to work out at [employer]   Behavioral modification strategies: increasing lean protein intake, decreasing simple carbohydrates , no meal skipping, increase water intake, avoiding temptations, keep healthy foods in the home, mindful eating, and eat on smaller plate.  Vanessa Christensen has agreed to follow-up with our clinic in 4 weeks.      Objective:   VITALS: Per patient if applicable, see vitals. GENERAL: Alert and in no acute distress. CARDIOPULMONARY: No increased WOB. Speaking in clear sentences.  PSYCH: Pleasant and cooperative. Speech normal rate and rhythm. Affect is appropriate. Insight and judgement are appropriate. Attention is focused, linear, and appropriate.  NEURO: Oriented as arrived to appointment on time with no prompting.   Attestation Statements:   This was prepared with the assistance of Engineer, civil (consulting).  Occasional wrong-word or sound-a-like substitutions may have occurred due to the inherent limitations of voice recognition software. Corinna Capra, DO

## 2023-04-12 ENCOUNTER — Telehealth (INDEPENDENT_AMBULATORY_CARE_PROVIDER_SITE_OTHER): Payer: Self-pay

## 2023-04-12 NOTE — Telephone Encounter (Addendum)
Message from Plan [facility] is not able to process this request through ePA, please contact the plan at [phone] or fax in request to [phone].  Call to [facility] [phone].  Unable to complete request through EPA.  Case# [id number].   Awaiting clinical questions via fax.

## 2023-04-14 ENCOUNTER — Other Ambulatory Visit: Payer: Self-pay | Admitting: Family Medicine

## 2023-04-14 DIAGNOSIS — E038 Other specified hypothyroidism: Secondary | ICD-10-CM

## 2023-04-16 ENCOUNTER — Telehealth (INDEPENDENT_AMBULATORY_CARE_PROVIDER_SITE_OTHER): Payer: Self-pay | Admitting: Bariatrics

## 2023-04-16 ENCOUNTER — Other Ambulatory Visit: Payer: Self-pay | Admitting: Radiology

## 2023-04-16 ENCOUNTER — Ambulatory Visit (INDEPENDENT_AMBULATORY_CARE_PROVIDER_SITE_OTHER): Payer: Medicare Other | Admitting: Physician Assistant

## 2023-04-16 ENCOUNTER — Other Ambulatory Visit (INDEPENDENT_AMBULATORY_CARE_PROVIDER_SITE_OTHER): Payer: Self-pay

## 2023-04-16 DIAGNOSIS — Z96652 Presence of left artificial knee joint: Secondary | ICD-10-CM | POA: Diagnosis not present

## 2023-04-16 DIAGNOSIS — R2689 Other abnormalities of gait and mobility: Secondary | ICD-10-CM

## 2023-04-16 NOTE — Telephone Encounter (Signed)
Please give patient a call regarding her Wegovy. She did not go into detail. She would like a call today

## 2023-04-16 NOTE — Progress Notes (Signed)
HPI: Mrs. Hardwell comes in today follow-up of her left total knee arthroplasty which was performed 10/27/2022.  She states she has had 2 falls since September both these were mechanical falls no lightheadedness or dizziness.  She notes that she does still feel that her gait and balance are off.  She is having no giving way of the knee.  Distal feels like the knee is not as strong as she would like for her to be.  She has stiffness whenever she first gets up to ambulate after being seated for even a short period of time.  She is going to a Systems analyst 2 times a week working on her core and her legs.  Otherwise not really doing a lot of exercise in regards to the knee.  Review of systems: See HPI otherwise negative  Physical exam: General Well-developed well-nourished female in no acute distress walks without any assistive device but an antalgic gait.  Left knee full extension full flexion.  No instability valgus varus stressing.  Bilateral knees no abnormal warmth erythema.  Well-healed surgical incisions from previous knee surgery both knees.  Radiographs: Left knee 2 views: Well-seated left total knee arthroplasty components.  No acute fractures findings.  Knee is well located.  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  She is shown quad strengthening exercises she can do on her own.  Encouraged her to begin using her recumbent stationary bike which she has at home.  Will see her back in 3 months to see how she is doing overall.  Questions were encouraged and answered at length

## 2023-04-16 NOTE — Telephone Encounter (Signed)
Phone call to patient regarding her prior authorization for her [facility]. She got a call from her insurance saying we still needed to complete Prior authorization. I told her we did attempt to do one via cover my meds but the message says Message from Plan [facility] is not able to process this request through ePA, please contact the plan at [phone] or fax in request to [phone]. Told patient we would call her plan to complete PA. We would let her know once its approved or denied.

## 2023-04-17 ENCOUNTER — Ambulatory Visit (AMBULATORY_SURGERY_CENTER): Payer: Medicare Other

## 2023-04-17 VITALS — Ht 62.0 in | Wt 198.0 lb

## 2023-04-17 DIAGNOSIS — Z860100 Personal history of colon polyps, unspecified: Secondary | ICD-10-CM

## 2023-04-17 MED ORDER — SUTAB 1479-225-188 MG PO TABS
12.0000 | ORAL_TABLET | ORAL | 0 refills | Status: DC
Start: 2023-04-17 — End: 2023-05-07

## 2023-04-17 NOTE — Telephone Encounter (Signed)
Please see telephone encounter dated 04/12/2023.

## 2023-04-17 NOTE — Progress Notes (Signed)

## 2023-04-18 NOTE — Telephone Encounter (Signed)
Resubmitted Questions to patient's insurance.

## 2023-04-19 ENCOUNTER — Other Ambulatory Visit: Payer: Self-pay | Admitting: Family Medicine

## 2023-04-24 NOTE — Telephone Encounter (Signed)
Resubmitted more questions to patients insurance.

## 2023-04-26 ENCOUNTER — Ambulatory Visit: Payer: Medicare Other | Admitting: Rehabilitative and Restorative Service Providers"

## 2023-05-01 ENCOUNTER — Encounter: Payer: Self-pay | Admitting: Physical Therapy

## 2023-05-01 ENCOUNTER — Ambulatory Visit (INDEPENDENT_AMBULATORY_CARE_PROVIDER_SITE_OTHER): Payer: Medicare Other | Admitting: Physical Therapy

## 2023-05-01 ENCOUNTER — Other Ambulatory Visit: Payer: Self-pay

## 2023-05-01 DIAGNOSIS — G8929 Other chronic pain: Secondary | ICD-10-CM | POA: Diagnosis not present

## 2023-05-01 DIAGNOSIS — R262 Difficulty in walking, not elsewhere classified: Secondary | ICD-10-CM

## 2023-05-01 DIAGNOSIS — M6281 Muscle weakness (generalized): Secondary | ICD-10-CM | POA: Diagnosis not present

## 2023-05-01 DIAGNOSIS — R2681 Unsteadiness on feet: Secondary | ICD-10-CM

## 2023-05-01 DIAGNOSIS — M25562 Pain in left knee: Secondary | ICD-10-CM | POA: Diagnosis not present

## 2023-05-01 NOTE — Therapy (Signed)
OUTPATIENT PHYSICAL THERAPY EVALUATION   Patient Name: Temperence Sokolski MRN: [id number] DOB:05-Apr-1951, 72 y.o., female Today's Date: 05/01/2023  END OF SESSION:  PT End of Session - 05/01/23 1348     Visit Number 1    Number of Visits 3    Date for PT Re-Evaluation 06/12/23    Authorization Type Medicare/BCBS Federal    Progress Note Due on Visit 10    PT Start Time 1150    PT Stop Time 1225    PT Time Calculation (min) 35 min    Activity Tolerance Patient tolerated treatment well    Behavior During Therapy WFL for tasks assessed/performed             Past Medical History:  Diagnosis Date   Anemia    hx of  iron deficient   Anxiety    pt denies   Arthritis    hands and feet   Cancer (HCC) 10/04/2006   breast    left   Cataracts, both eyes    Depression    Depression    Diverticulosis of colon    GERD (gastroesophageal reflux disease)    Glaucoma    Hyperlipidemia    Hypertension    no meds   Joint pain    Knee pain    Obesity    OSA on CPAP    Osteoarthritis    Sleep apnea    no CPAP- no longer needed d/t weight loss   Thyroid activity decreased    Past Surgical History:  Procedure Laterality Date   BREAST SURGERY Bilateral    CATARACT EXTRACTION     COLONOSCOPY     GLAUCOMA REPAIR     MASTECTOMY, RADICAL Bilateral    neulasta induced sterile abscesses     THYROIDECTOMY, PARTIAL     TONSILLECTOMY     TOTAL KNEE ARTHROPLASTY Right    TOTAL KNEE ARTHROPLASTY Left 10/27/2022   Procedure: LEFT TOTAL KNEE ARTHROPLASTY;  Surgeon: [name], MD;  Location: [facility];  Service: Orthopedics;  Laterality: Left;   TOTAL KNEE REVISION Right 05/07/2020   Procedure: RIGHT TOTAL KNEE REVISION ARTHROPLASTY;  Surgeon: [name], MD;  Location: [facility];  Service: Orthopedics;  Laterality: Right;   Patient Active Problem List   Diagnosis Date Noted   Atherosclerosis of artery 03/12/2023   Polyphagia 03/12/2023   Adrenal nodule (HCC)  11/16/2022   Thyroid nodule 11/16/2022   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   Generalized obesity 10/05/2022   Memory loss 06/12/2022   Essential hypertension 04/13/2022   Obesity (BMI 30-39.9) 04/13/2022   Stress incontinence 11/08/2021   Status post revision of total replacement of right knee 05/07/2020   Failed total knee, right, 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/2020   History of total right knee replacement 01/28/2020   Vitamin D deficiency 08/29/2018   Insulin resistance 08/29/2018   Other specified glaucoma 08/14/2018   Incontinence of urine in female 11/07/2017   Vertigo 11/07/2017   Left knee pain 10/25/2016   Genetic testing 08/08/2016   Hypothyroidism 12/02/2015   Anxiety and depression 12/02/2015   OAB (overactive bladder) 12/02/2015   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/02/2015   BMI 37.0-37.9, adult 12/02/2015    PCP: Sheliah Hatch, MD  REFERRING PROVIDER: Kirtland Bouchard, PA-C  REFERRING DIAG:  Diagnosis  [phone] (ICD-10-CM) - Status post total left knee replacement  R26.89 (ICD-10-CM) - Abnormality of gait due to impairment of balance  THERAPY DIAG:  Chronic pain of left knee - Plan: PT plan of care cert/re-cert  Muscle weakness (generalized) - Plan: PT plan of care cert/re-cert  Difficulty in walking, not elsewhere classified - Plan: PT plan of care cert/re-cert  Unsteadiness on feet - Plan: PT plan of care cert/re-cert  Rationale for Evaluation and Treatment: Rehabilitation  ONSET DATE: 10/27/2022 TKA  SUBJECTIVE:   SUBJECTIVE STATEMENT: Berlene had her left TKA 10/27/2022.  She has fallen 2 x since surgery and would like to have better balance and strength.  She reports continued pain especially after being in a prolonged positioning.  She reports 2 falls.  She hasn't been overly compliant with HEP since d/c from PT here immediately following her surgery.  PERTINENT HISTORY: OA, breast CA, HLD, HTN,  hypothyroid, obesity, Bil TKAs, vertigo, anxiety, depression  PAIN:  Are you having pain? Yes: NPRS scale: 0 currently, up to 4/10 Pain location: Lt knee Pain description: dull, aching, stiffness Aggravating factors: prolonged static positioning (driving with knee bent, sleeping) Relieving factors: movement  PRECAUTIONS: Fall  RED FLAGS: None   WEIGHT BEARING RESTRICTIONS: No  FALLS:  Has patient fallen in last 6 months? Yes. Number of falls 2  LIVING ENVIRONMENT: Lives with: lives with their spouse Lives in: House/apartment Stairs: stairs to enter with bil handrails, bike is upstairs  OCCUPATION: retired  PLOF: Independent and Leisure: Materials engineer, [employer]  PATIENT GOALS: improve pain and strength, better balance    OBJECTIVE:  Note: Objective measures were completed at Evaluation unless otherwise noted.  DIAGNOSTIC FINDINGS: Left knee 2 views: Well-seated left total knee arthroplasty components.   No acute fractures findings.  Knee is well located.  PATIENT SURVEYS:  05/01/23: FOTO deferred; pt arrived late  COGNITION: Overall cognitive status: Within functional limits for tasks assessed     SENSATION: WFL  POSTURE: rounded shoulders and forward head   LOWER EXTREMITY STRENGTH:  MMT Left/Right 05/01/2023  Hip flexion   Hip extension   Hip abduction   Hip adduction   Hip internal rotation   Hip external rotation   Knee flexion 5/4  Knee extension 5/3  Ankle dorsiflexion   Ankle plantarflexion   Ankle inversion   Ankle eversion    (Blank rows = not tested)  BERG TEST:  05/01/23: 42/56  [facility] PT Assessment - 05/01/23 1207       Standardized Balance Assessment   Standardized Balance Assessment Berg Balance Test      Berg Balance Test   Sit to Stand Able to stand using hands after several tries    Standing Unsupported Able to stand safely 2 minutes    Sitting with Back Unsupported but Feet Supported on Floor or Stool Able to sit safely and  securely 2 minutes    Stand to Sit Sits safely with minimal use of hands    Transfers Able to transfer safely, minor use of hands    Standing Unsupported with Eyes Closed Able to stand 10 seconds with supervision    Standing Unsupported with Feet Together Able to place feet together independently and stand for 1 minute with supervision    From Standing, Reach Forward with Outstretched Arm Can reach confidently >25 cm (10")    From Standing Position, Pick up Object from Floor Able to pick up shoe safely and easily    From Standing Position, Turn to Look Behind Over each Shoulder Looks behind one side only/other side shows less weight shift    Turn 360 Degrees Able to  turn 360 degrees safely but slowly    Standing Unsupported, Alternately Place Feet on Step/Stool Able to complete 4 steps without aid or supervision    Standing Unsupported, One Foot in Front Able to take small step independently and hold 30 seconds    Standing on One Leg Tries to lift leg/unable to hold 3 seconds but remains standing independently    Total Score 42               FUNCTIONAL TESTS:  05/01/23: 5 times sit to stand: 16.50 sec without UE support - needed to adjust initially with difficulty rising initially  GAIT: 05/01/23:  Distance walked: 100' within clinic Assistive device utilized: None Level of assistance: SBA Comments: shuffling pattern, with decreased stance on Lt   TODAY'S TREATMENT:                                                                                                                              DATE:  05/01/2023  See HEP - reviewed with pt and encouraged her to work on resuming HEP as well as continue at gym with Systems analyst.  Also recommended single limb strengthening to isolate the LLE when able.  PATIENT EDUCATION:  Education details: HEP Person educated: Patient Education method: Programmer, multimedia, Facilities manager, and Handouts Education comprehension: verbalized understanding,  returned demonstration, and needs further education  HOME EXERCISE PROGRAM: Access Code: [email or web address] URL: [email or web address] Date: 05/01/2023 Prepared by: Moshe Cipro  Exercises - Supine Heel Slide (Mirrored)  - 3-5 x daily - 7 x weekly - 1 sets - 10 reps - 5 hold - Seated Long Arc Quad (Mirrored)  - 3-5 x daily - 7 x weekly - 1 sets - 5-10 reps - 2 hold - Seated Quad Set (Mirrored)  - 3-5 x daily - 7 x weekly - 1 sets - 10 reps - 5 hold - Sit to Stand  - 3 x daily - 7 x weekly - 1 sets - 10 reps - Seated Straight Leg Heel Taps  - 1-2 x daily - 7 x weekly - 3 sets - 10 reps - Heel Toe Raises with Counter Support  - 1-2 x daily - 7 x weekly - 1-2 sets - 10-15 reps - Tandem Stance  - 1 x daily - 7 x weekly - 1 sets - 3-5 reps - 30 hold  ASSESSMENT:  CLINICAL IMPRESSION: Pt is a 72 y/o female who presents to [facility] for continued weakness and balance deficits following Lt TKA in May 2024.  She demonstrates decreased strength and balance as well as gait abnormalities affecting functional mobility.  She will benefit from PT to address deficits listed.    OBJECTIVE IMPAIRMENTS: Abnormal gait, decreased activity tolerance, decreased balance, decreased mobility, difficulty walking, decreased strength, and pain.   ACTIVITY LIMITATIONS: carrying, lifting, bending, standing, squatting, stairs, transfers, bed mobility, and locomotion level  PARTICIPATION LIMITATIONS: meal prep, cleaning, laundry, driving, shopping, and community activity  PERSONAL FACTORS: OA,  breast CA, HLD, HTN, hypothyroid, obesity, Bil TKAs, vertigo are also affecting patient's functional outcome.   REHAB POTENTIAL: Good  CLINICAL DECISION MAKING: Evolving/moderate complexity  EVALUATION COMPLEXITY: Moderate   GOALS: Goals reviewed with patient? Yes  SHORT TERM GOALS: Target date: 05/22/2023  Independent with initial HEP Goal status: INITIAL   LONG TERM GOALS: Target date:  06/12/2023  Independent with final HEP Goal status: INITIAL  2.  LLE strength improved to 4+/5 for improved function and mobility Goal status: INIITAL  4.  Report pain < 2/10 with standing and walking activities for improved function Goal status: INITIAL  5.  Improve BERG to >/= 48/56 for decreased fall risk Goal status: INITIAL  6.  Improve 5x STS to < 14 sec for improved functional strength and mobility Goal status: INITIAL     PLAN:  PT FREQUENCY: every other week  PT DURATION: 6 weeks  PLANNED INTERVENTIONS: 97164- PT Re-evaluation, 97110-Therapeutic exercises, 97530- Therapeutic activity, 97112- Neuromuscular re-education, 97535- Self Care, 56213- Manual therapy, [id number]- Gait training, [phone]- Aquatic Therapy, 97014- Electrical stimulation (unattended), 97016- Vasopneumatic device, Patient/Family education, Balance training, Stair training, Taping, Dry Needling, Cryotherapy, and Moist heat  PLAN FOR NEXT SESSION: capture FOTO, review HEP, progress as able  NEXT MD VISIT: 07/16/23  Clarita Crane, PT, DPT 05/01/23 1:50 PM

## 2023-05-07 ENCOUNTER — Ambulatory Visit: Payer: Medicare Other | Admitting: Internal Medicine

## 2023-05-07 ENCOUNTER — Encounter: Payer: Self-pay | Admitting: Internal Medicine

## 2023-05-07 VITALS — BP 161/57 | HR 76 | Temp 97.3°F | Resp 16 | Ht 62.0 in | Wt 198.0 lb

## 2023-05-07 DIAGNOSIS — G4733 Obstructive sleep apnea (adult) (pediatric): Secondary | ICD-10-CM | POA: Diagnosis not present

## 2023-05-07 DIAGNOSIS — I10 Essential (primary) hypertension: Secondary | ICD-10-CM | POA: Diagnosis not present

## 2023-05-07 DIAGNOSIS — E669 Obesity, unspecified: Secondary | ICD-10-CM | POA: Diagnosis not present

## 2023-05-07 DIAGNOSIS — Z1211 Encounter for screening for malignant neoplasm of colon: Secondary | ICD-10-CM | POA: Diagnosis not present

## 2023-05-07 DIAGNOSIS — F32A Depression, unspecified: Secondary | ICD-10-CM | POA: Diagnosis not present

## 2023-05-07 DIAGNOSIS — D123 Benign neoplasm of transverse colon: Secondary | ICD-10-CM | POA: Diagnosis not present

## 2023-05-07 DIAGNOSIS — Z860101 Personal history of adenomatous and serrated colon polyps: Secondary | ICD-10-CM | POA: Diagnosis not present

## 2023-05-07 DIAGNOSIS — Z860100 Personal history of colon polyps, unspecified: Secondary | ICD-10-CM

## 2023-05-07 DIAGNOSIS — K5730 Diverticulosis of large intestine without perforation or abscess without bleeding: Secondary | ICD-10-CM | POA: Diagnosis not present

## 2023-05-07 MED ORDER — SODIUM CHLORIDE 0.9 % IV SOLN: 500.0000 mL | Freq: Once | INTRAVENOUS | Status: DC

## 2023-05-07 NOTE — Progress Notes (Signed)
GASTROENTEROLOGY PROCEDURE H&P NOTE   Primary Care Physician: Sheliah Hatch, MD    Reason for Procedure:  History of multiple adenomatous colon polyps  Plan:    Colonoscopy  Patient is appropriate for endoscopic procedure(s) in the ambulatory ([facility]) setting.  The nature of the procedure, as well as the risks, benefits, and alternatives were carefully and thoroughly reviewed with the patient. Ample time for discussion and questions allowed. The patient understood, was satisfied, and agreed to proceed.     HPI: Vanessa Christensen is a 72 y.o. female who presents for surveillance colonoscopy.  Medical history as below.  Tolerated the prep.  No recent chest pain or shortness of breath.  No abdominal pain today.  Past Medical History:  Diagnosis Date   Anemia    hx of  iron deficient   Anxiety    pt denies   Arthritis    hands and feet   Cancer (HCC) 10/04/2006   breast    left   Cataracts, both eyes    Depression    Depression    Diverticulosis of colon    GERD (gastroesophageal reflux disease)    Glaucoma    Hyperlipidemia    Hypertension    no meds   Joint pain    Knee pain    Obesity    OSA on CPAP    Osteoarthritis    Sleep apnea    no CPAP- no longer needed d/t weight loss   Thyroid activity decreased     Past Surgical History:  Procedure Laterality Date   BREAST SURGERY Bilateral    CATARACT EXTRACTION     COLONOSCOPY     GLAUCOMA REPAIR     MASTECTOMY, RADICAL Bilateral    neulasta induced sterile abscesses     THYROIDECTOMY, PARTIAL     TONSILLECTOMY     TOTAL KNEE ARTHROPLASTY Right    TOTAL KNEE ARTHROPLASTY Left 10/27/2022   Procedure: LEFT TOTAL KNEE ARTHROPLASTY;  Surgeon: [name], MD;  Location: [facility];  Service: Orthopedics;  Laterality: Left;   TOTAL KNEE REVISION Right 05/07/2020   Procedure: RIGHT TOTAL KNEE REVISION ARTHROPLASTY;  Surgeon: [name], MD;  Location: [facility];  Service: Orthopedics;   Laterality: Right;    Prior to Admission medications   Medication Sig Start Date End Date Taking? Authorizing Provider  Ascorbic Acid (VITAMIN C PO) Take 1 tablet by mouth daily.   Yes [provider]  aspirin 81 MG chewable tablet Chew 1 tablet (81 mg total) by mouth 2 (two) times daily. 10/28/22  Yes [name], MD  B Complex-C (SUPER B COMPLEX PO) Take 1 tablet by mouth daily after supper.   Yes [provider]  buPROPion (WELLBUTRIN XL) 150 MG 24 hr tablet TAKE 2 TABLETS BY MOUTH DAILY. 03/12/23  Yes Sheliah Hatch, MD  cetirizine (ZYRTEC) 10 MG tablet Take 10 mg by mouth daily.   Yes [provider]  Cholecalciferol (VITAMIN D) 50 MCG (2000 UT) tablet Take 2,000 Units by mouth daily.   Yes [provider]  dorzolamide-timolol (COSOPT) 2-0.5 % ophthalmic solution Place 1 drop into both eyes 2 (two) times daily. 03/13/22  Yes [provider]  FLUoxetine (PROZAC) 40 MG capsule TAKE 1 CAPSULE (40 MG TOTAL) BY MOUTH DAILY. 06/20/22  Yes Sheliah Hatch, MD  levothyroxine (SYNTHROID) 75 MCG tablet TAKE 1 TABLET BY MOUTH EVERY DAY 04/16/23  Yes Sheliah Hatch, MD  clonazePAM (KLONOPIN) 0.5 MG tablet Take  1 tablet (0.5 mg total) by mouth 2 (two) times daily as needed for anxiety. 11/16/22   Sheliah Hatch, MD  diphenhydramine-acetaminophen (TYLENOL PM) 25-500 MG TABS tablet Take 2 tablets by mouth at bedtime as needed (sleep).    [provider]  ibuprofen (ADVIL) 200 MG tablet Take 400-800 mg by mouth every 6 (six) hours as needed for moderate pain.    [provider]  MYRBETRIQ 25 MG TB24 tablet TAKE 1 TABLET (25 MG TOTAL) BY MOUTH DAILY. 04/19/23   Sheliah Hatch, MD  psyllium (METAMUCIL) 58.6 % powder Take 1 packet by mouth every other day.    [provider]  rosuvastatin (CRESTOR) 10 MG tablet TAKE 1 TABLET BY MOUTH EVERY DAY Patient not taking: Reported on 05/07/2023 03/12/23   Sheliah Hatch, MD  Semaglutide-Weight Management (WEGOVY) 0.25 MG/0.5ML SOAJ Inject 0.25 mg into the skin once a week. Patient not taking: Reported on 04/17/2023 03/12/23   Corinna Capra A, DO  traZODone (DESYREL) 50 MG tablet TAKE 1/2 TO 1 TABLET BY MOUTH AT BEDTIME AS NEEDED FOR SLEEP Patient taking differently: Take 50 mg by mouth at bedtime. 09/15/22   Sheliah Hatch, MD    Current Outpatient Medications  Medication Sig Dispense Refill   Ascorbic Acid (VITAMIN C PO) Take 1 tablet by mouth daily.     aspirin 81 MG chewable tablet Chew 1 tablet (81 mg total) by mouth 2 (two) times daily. 30 tablet 0   B Complex-C (SUPER B COMPLEX PO) Take 1 tablet by mouth daily after supper.     buPROPion (WELLBUTRIN XL) 150 MG 24 hr tablet TAKE 2 TABLETS BY MOUTH DAILY. 180 tablet 1   cetirizine (ZYRTEC) 10 MG tablet Take 10 mg by mouth daily.     Cholecalciferol (VITAMIN D) 50 MCG (2000 UT) tablet Take 2,000 Units by mouth daily.     dorzolamide-timolol (COSOPT) 2-0.5 % ophthalmic solution Place 1 drop into both eyes 2 (two) times daily.     FLUoxetine (PROZAC) 40 MG capsule TAKE 1 CAPSULE (40 MG TOTAL) BY MOUTH DAILY. 90 capsule 3   levothyroxine (SYNTHROID) 75 MCG tablet TAKE 1 TABLET BY MOUTH EVERY DAY 90 tablet 1   clonazePAM (KLONOPIN) 0.5 MG tablet Take 1 tablet (0.5 mg total) by mouth 2 (two) times daily as needed for anxiety. 60 tablet 1   diphenhydramine-acetaminophen (TYLENOL PM) 25-500 MG TABS tablet Take 2 tablets by mouth at bedtime as needed (sleep).     ibuprofen (ADVIL) 200 MG tablet Take 400-800 mg by mouth every 6 (six) hours as needed for moderate pain.     MYRBETRIQ 25 MG TB24 tablet TAKE 1 TABLET (25 MG TOTAL) BY MOUTH DAILY. 30 tablet 3   psyllium (METAMUCIL) 58.6 % powder Take 1 packet by mouth every other day.     rosuvastatin (CRESTOR) 10 MG tablet TAKE 1 TABLET BY MOUTH EVERY DAY (Patient not taking: Reported on 05/07/2023) 90 tablet 1   Semaglutide-Weight Management (WEGOVY)  0.25 MG/0.5ML SOAJ Inject 0.25 mg into the skin once a week. (Patient not taking: Reported on 04/17/2023) 2 mL 0   traZODone (DESYREL) 50 MG tablet TAKE 1/2 TO 1 TABLET BY MOUTH AT BEDTIME AS NEEDED FOR SLEEP (Patient taking differently: Take 50 mg by mouth at bedtime.) 90 tablet 2   Current Facility-Administered Medications  Medication Dose Route Frequency Provider Last Rate Last Admin   0.9 %  sodium chloride infusion  500 mL Intravenous Once Torianne Laflam, Carie Caddy,  MD        Allergies as of 05/07/2023 - Review Complete 05/07/2023  Allergen Reaction Noted   Neulasta [pegfilgrastim] Other (See Comments) 11/03/2015    Family History  Problem Relation Age of Onset   CVA Mother    Cancer Mother 70       breast cancer    Heart disease Mother    Stroke Mother    Obesity Mother    Colon polyps Father    Leukemia Father    High blood pressure Father    High Cholesterol Father    Heart disease Father    Cancer Maternal Aunt 12       breast cancer    Cancer Maternal Aunt 55       breast cancer   Colon cancer Maternal Aunt    Cancer Cousin 32       breast cancer   Esophageal cancer Neg Hx    Rectal cancer Neg Hx    Stomach cancer Neg Hx     Social History   Socioeconomic History   Marital status: Married    Spouse name: Dijonay Divan   Number of children: 2   Years of education: Not on file   Highest education level: Not on file  Occupational History   Occupation: Retired    Comment: Runner, broadcasting/film/video  Tobacco Use   Smoking status: Former    Current packs/day: 0.00    Types: Cigarettes    Quit date: 11/02/1977    Years since quitting: 45.5   Smokeless tobacco: Never  Vaping Use   Vaping status: Never Used  Substance and Sexual Activity   Alcohol use: No   Drug use: No   Sexual activity: Not Currently    Birth control/protection: Post-menopausal  Other Topics Concern   Not on file  Social History Narrative   Enjoys taking care of grandchildren    Social Determinants of Health    Financial Resource Strain: Low Risk  (10/11/2022)   Overall Financial Resource Strain (CARDIA)    Difficulty of Paying Living Expenses: Not hard at all  Food Insecurity: No Food Insecurity (10/27/2022)   Hunger Vital Sign    Worried About Running Out of Food in the Last Year: Never true    Ran Out of Food in the Last Year: Never true  Transportation Needs: No Transportation Needs (10/27/2022)   PRAPARE - Administrator, Civil Service (Medical): No    Lack of Transportation (Non-Medical): No  Physical Activity: Insufficiently Active (10/11/2022)   Exercise Vital Sign    Days of Exercise per Week: 3 days    Minutes of Exercise per Session: 30 min  Stress: No Stress Concern Present (10/11/2022)   [employer] of Occupational Health - Occupational Stress Questionnaire    Feeling of Stress : Not at all  Social Connections: Socially Integrated (10/11/2022)   Social Connection and Isolation Panel [NHANES]    Frequency of Communication with Friends and Family: Three times a week    Frequency of Social Gatherings with Friends and Family: More than three times a week    Attends Religious Services: More than 4 times per year    Active Member of [employer] or Organizations: Yes    Attends Banker Meetings: More than 4 times per year    Marital Status: Married  Catering manager Violence: Not At Risk (10/27/2022)   Humiliation, Afraid, Rape, and Kick questionnaire    Fear of Current or Ex-Partner: No  Emotionally Abused: No    Physically Abused: No    Sexually Abused: No    Physical Exam: Vital signs in last 24 hours: @BP  (!) 165/80   Pulse 85   Temp (!) 97.3 F (36.3 C)   Ht 5\' 2"  (1.575 m)   Wt 198 lb (89.8 kg)   SpO2 97%   BMI 36.21 kg/m  GEN: NAD EYE: Sclerae anicteric ENT: MMM CV: Non-tachycardic Pulm: CTA b/l GI: Soft, NT/ND NEURO:  Alert & Oriented x 3   Erick Blinks, MD [facility]  05/07/2023 9:27 AM

## 2023-05-07 NOTE — Progress Notes (Signed)
Called to room to assist during endoscopic procedure.  Patient ID and intended procedure confirmed with present staff. Received instructions for my participation in the procedure from the performing physician.  

## 2023-05-07 NOTE — Progress Notes (Signed)
Vss nad trans to pacu 

## 2023-05-07 NOTE — Op Note (Signed)
[facility] Patient Name: Vanessa Christensen Procedure Date: 05/07/2023 9:24 AM MRN: [id number] Endoscopist: Beverley Fiedler , MD, [id number] Age: 72 Referring MD:  Date of Birth: 10-Jul-1950 Gender: Female Account #: [id number] Procedure:                Colonoscopy Indications:              High risk colon cancer surveillance: Personal                            history of multiple adenomas, Last colonoscopy:                            April 2021 (4 TAs), Feb 2018 (6 TAs) Medicines:                Monitored Anesthesia Care Procedure:                Pre-Anesthesia Assessment:                           - Prior to the procedure, a History and Physical                            was performed, and patient medications and                            allergies were reviewed. The patient's tolerance of                            previous anesthesia was also reviewed. The risks                            and benefits of the procedure and the sedation                            options and risks were discussed with the patient.                            All questions were answered, and informed consent                            was obtained. Prior Anticoagulants: The patient has                            taken no anticoagulant or antiplatelet agents. ASA                            Grade Assessment: III - A patient with severe                            systemic disease. After reviewing the risks and                            benefits, the patient was deemed in satisfactory  condition to undergo the procedure.                           After obtaining informed consent, the colonoscope                            was passed under direct vision. Throughout the                            procedure, the patient's blood pressure, pulse, and                            oxygen saturations were monitored continuously. The                            Olympus Scope SN [id number]  was introduced through the                            anus and advanced to the cecum, identified by                            appendiceal orifice and ileocecal valve. The                            colonoscopy was performed without difficulty. The                            patient tolerated the procedure well. The quality                            of the bowel preparation was good. The ileocecal                            valve, appendiceal orifice, and rectum were                            photographed. Scope In: 9:39:27 AM Scope Out: 9:56:20 AM Scope Withdrawal Time: 0 hours 11 minutes 8 seconds  Total Procedure Duration: 0 hours 16 minutes 53 seconds  Findings:                 The digital rectal exam was normal.                           Two sessile polyps were found in the transverse                            colon. The polyps were 2 to 5 mm in size. These                            polyps were removed with a cold snare. Resection                            and retrieval were complete.  Multiple medium-mouthed and small-mouthed                            diverticula were found in the sigmoid colon,                            descending colon, hepatic flexure and ascending                            colon.                           The retroflexed view of the distal rectum and anal                            verge was normal and showed no anal or rectal                            abnormalities. Complications:            No immediate complications. Estimated Blood Loss:     Estimated blood loss: none. Impression:               - Two 2 to 5 mm polyps in the transverse colon,                            removed with a cold snare. Resected and retrieved.                           - Moderate diverticulosis in the sigmoid colon, in                            the descending colon, at the hepatic flexure and in                            the ascending colon.                            - The distal rectum and anal verge are normal on                            retroflexion view. Recommendation:           - Patient has a contact number available for                            emergencies. The signs and symptoms of potential                            delayed complications were discussed with the                            patient. Return to normal activities tomorrow.                            Written discharge instructions  were provided to the                            patient.                           - Resume previous diet.                           - Continue present medications.                           - Await pathology results.                           - No recommendation at this time regarding repeat                            colonoscopy due to age at next surveillance                            interval (5 years). The decision to repeat                            surveillance colonoscopy can be based on the                            patient's overall health at that time. Beverley Fiedler, MD 05/07/2023 10:01:03 AM This report has been signed electronically.

## 2023-05-07 NOTE — Patient Instructions (Signed)
Thank you for letting us take care of your healthcare needs today! Please see handouts regarding Polyps and Diverticulosis.  YOU HAD AN ENDOSCOPIC PROCEDURE TODAY AT THE [facility]:   Refer to the procedure report that was given to you for any specific questions about what was found during the examination.  If the procedure report does not answer your questions, please call your gastroenterologist to clarify.  If you requested that your care partner not be given the details of your procedure findings, then the procedure report has been included in a sealed envelope for you to review at your convenience later.  YOU SHOULD EXPECT: Some feelings of bloating in the abdomen. Passage of more gas than usual.  Walking can help get rid of the air that was put into your GI tract during the procedure and reduce the bloating. If you had a lower endoscopy (such as a colonoscopy or flexible sigmoidoscopy) you may notice spotting of blood in your stool or on the toilet paper. If you underwent a bowel prep for your procedure, you may not have a normal bowel movement for a few days.  Please Note:  You might notice some irritation and congestion in your nose or some drainage.  This is from the oxygen used during your procedure.  There is no need for concern and it should clear up in a day or so.  SYMPTOMS TO REPORT IMMEDIATELY:  Following lower endoscopy (colonoscopy or flexible sigmoidoscopy):  Excessive amounts of blood in the stool  Significant tenderness or worsening of abdominal pains  Swelling of the abdomen that is new, acute  Fever of 100F or higher  For urgent or emergent issues, a gastroenterologist can be reached at any hour by calling [phone]. Do not use MyChart messaging for urgent concerns.    DIET:  We do recommend a small meal at first, but then you may proceed to your regular diet.  Drink plenty of fluids but you should avoid alcoholic beverages for 24 hours.  ACTIVITY:   You should plan to take it easy for the rest of today and you should NOT DRIVE or use heavy machinery until tomorrow (because of the sedation medicines used during the test).    FOLLOW UP: Our staff will call the number listed on your records the next business day following your procedure.  We will call around 7:15- 8:00 am to check on you and address any questions or concerns that you may have regarding the information given to you following your procedure. If we do not reach you, we will leave a message.     If any biopsies were taken you will be contacted by phone or by letter within the next 1-3 weeks.  Please call us at [phone] if you have not heard about the biopsies in 3 weeks.    SIGNATURES/CONFIDENTIALITY: You and/or your care partner have signed paperwork which will be entered into your electronic medical record.  These signatures attest to the fact that that the information above on your After Visit Summary has been reviewed and is understood.  Full responsibility of the confidentiality of this discharge information lies with you and/or your care-partner.

## 2023-05-07 NOTE — Progress Notes (Signed)
Pt's states no medical or surgical changes since previsit or office visit. 

## 2023-05-08 ENCOUNTER — Telehealth: Payer: Self-pay

## 2023-05-08 NOTE — Telephone Encounter (Signed)
  Follow up Call-     05/07/2023    8:55 AM  Call back number  Post procedure Call Back phone  # [phone]  Permission to leave phone message Yes     Patient questions:  Do you have a fever, pain , or abdominal swelling? No. Pain Score  0 *  Have you tolerated food without any problems? Yes.    Have you been able to return to your normal activities? Yes.    Do you have any questions about your discharge instructions: Diet   No. Medications  No. Follow up visit  No.  Do you have questions or concerns about your Care? No.  Actions: * If pain score is 4 or above: No action needed, pain <4.

## 2023-05-09 DIAGNOSIS — Z23 Encounter for immunization: Secondary | ICD-10-CM | POA: Diagnosis not present

## 2023-05-09 LAB — SURGICAL PATHOLOGY

## 2023-05-11 ENCOUNTER — Encounter: Payer: Self-pay | Admitting: Internal Medicine

## 2023-05-14 ENCOUNTER — Encounter: Payer: Self-pay | Admitting: Bariatrics

## 2023-05-14 ENCOUNTER — Ambulatory Visit (INDEPENDENT_AMBULATORY_CARE_PROVIDER_SITE_OTHER): Payer: Medicare Other | Admitting: Bariatrics

## 2023-05-14 VITALS — BP 157/75 | HR 69 | Temp 97.8°F | Ht 62.0 in | Wt 200.0 lb

## 2023-05-14 DIAGNOSIS — R632 Polyphagia: Secondary | ICD-10-CM

## 2023-05-14 DIAGNOSIS — E66812 Obesity, class 2: Secondary | ICD-10-CM

## 2023-05-14 DIAGNOSIS — Z6836 Body mass index (BMI) 36.0-36.9, adult: Secondary | ICD-10-CM

## 2023-05-14 DIAGNOSIS — E669 Obesity, unspecified: Secondary | ICD-10-CM | POA: Diagnosis not present

## 2023-05-14 DIAGNOSIS — I10 Essential (primary) hypertension: Secondary | ICD-10-CM | POA: Diagnosis not present

## 2023-05-14 NOTE — Progress Notes (Signed)
WEIGHT SUMMARY AND BIOMETRICS  Weight Lost Since Last Visit: 0  Weight Gained Since Last Visit: 2lb   Vitals Temp: 97.8 F (36.6 C) BP: (!) 157/75 Pulse Rate: 69 SpO2: 99 %   Anthropometric Measurements Height: 5\' 2"  (1.575 m) Weight: 200 lb (90.7 kg) BMI (Calculated): 36.57 Weight at Last Visit: 198lb Weight Lost Since Last Visit: 0 Weight Gained Since Last Visit: 2lb Starting Weight: 241lb Total Weight Loss (lbs): 41 lb (18.6 kg)   Body Composition  Body Fat %: 47.4 % Fat Mass (lbs): 94.8 lbs Muscle Mass (lbs): 100 lbs Total Body Water (lbs): 78 lbs Visceral Fat Rating : 16   Other Clinical Data Fasting: no Labs: no Today's Visit #: 70 Starting Date: 08/13/18    OBESITY Vanessa Christensen is here to discuss her progress with her obesity treatment plan along with follow-up of her obesity related diagnoses.    Nutrition Plan: the Category 3 plan - 30% adherence.  Current exercise:  Physical therapy and a Systems analyst.   Interim History:  She is up 2 lbs since her last visit. She has been to more events. She has been eating more cookies.  Eating all of the food on the plan., Is not skipping meals, Water intake is adequate., and Denies polyphagia   Pharmacotherapy: Vanessa Christensen is on Wegovy 0.25 mg SQ weekly Adverse side effects: none, has not started.  Hunger is moderately controlled.  Cravings are moderately controlled.  Assessment/Plan:   1. Polyphagia Vanessa Christensen endorses excessive hunger. She would like to lose another 30 lbs.  Medication(s): She was approved for [facility] and will pick it up as soon as available.   Effects of medication:  moderately controlled. Cravings are moderately controlled.   Plan: Medication(s): Wegovy 0.25 mg SQ weekly Will increase water, protein and fiber to help assuage hunger.  Will minimize foods that have a high glucose  index/load to minimize reactive hypoglycemia.   2. Hypertension Hypertension control uncertain.  Medication(s): no medications.   BP Readings from Last 3 Encounters:  05/14/23 (!) 157/75  05/07/23 (!) 161/57  04/09/23 (!) 146/82   Lab Results  Component Value Date   CREATININE 0.78 01/15/2023   CREATININE 0.70 11/10/2022   CREATININE 0.73 11/10/2022   Lab Results  Component Value Date   GFR 74.97 01/15/2023   GFR 84.48 02/08/2022   GFR 82.01 07/27/2021    Plan: No added salt. Will keep sodium content to 1,500 mg or less per day.     Generalized Obesity: Current BMI BMI (Calculated): 36.57   Pharmacotherapy Plan Continue  Wegovy 0.25 mg SQ weekly  Vanessa Christensen is currently in the action stage of change. As such, her goal is to continue with weight loss efforts.  She has agreed to the Category 3 plan.  Exercise goals: Older adults should do exercises that maintain or improve balance if they are at risk  of falling.  She is back in PT.   Behavioral modification strategies: increasing lean protein intake, meal planning , better snacking choices, planning for success, increasing vegetables, avoiding temptations, and mindful eating.  Vanessa Christensen has agreed to follow-up with our clinic in 4 weeks.       Objective:   VITALS: Per patient if applicable, see vitals. GENERAL: Alert and in no acute distress. CARDIOPULMONARY: No increased WOB. Speaking in clear sentences.  PSYCH: Pleasant and cooperative. Speech normal rate and rhythm. Affect is appropriate. Insight and judgement are appropriate. Attention is focused, linear, and appropriate.  NEURO: Oriented as arrived to appointment on time with no prompting.   Attestation Statements:    This was prepared with the assistance of Engineer, civil (consulting).  Occasional wrong-word or sound-a-like substitutions may have occurred due to the inherent limitations of voice recognition   Vanessa Capra, DO

## 2023-05-16 ENCOUNTER — Encounter: Payer: Self-pay | Admitting: Rehabilitative and Restorative Service Providers"

## 2023-05-16 ENCOUNTER — Ambulatory Visit (INDEPENDENT_AMBULATORY_CARE_PROVIDER_SITE_OTHER): Payer: Medicare Other | Admitting: Rehabilitative and Restorative Service Providers"

## 2023-05-16 DIAGNOSIS — R262 Difficulty in walking, not elsewhere classified: Secondary | ICD-10-CM | POA: Diagnosis not present

## 2023-05-16 DIAGNOSIS — M6281 Muscle weakness (generalized): Secondary | ICD-10-CM

## 2023-05-16 DIAGNOSIS — M25562 Pain in left knee: Secondary | ICD-10-CM

## 2023-05-16 DIAGNOSIS — R2681 Unsteadiness on feet: Secondary | ICD-10-CM

## 2023-05-16 DIAGNOSIS — G8929 Other chronic pain: Secondary | ICD-10-CM

## 2023-05-16 NOTE — Therapy (Signed)
OUTPATIENT PHYSICAL THERAPY EVALUATION   Patient Name: Vanessa Christensen MRN: [id number] DOB:[birth year], 72 y.o., female Today's Date: 05/16/2023  END OF SESSION:  PT End of Session - 05/16/23 1443     Visit Number 2    Number of Visits 3    Date for PT Re-Evaluation 06/12/23    Authorization Type Medicare/BCBS Federal    Progress Note Due on Visit 10    PT Start Time 1436    PT Stop Time 1515    PT Time Calculation (min) 39 min    Activity Tolerance Patient tolerated treatment well    Behavior During Therapy WFL for tasks assessed/performed              Past Medical History:  Diagnosis Date   Anemia    hx of  iron deficient   Anxiety    pt denies   Arthritis    hands and feet   Cancer (HCC) 10/04/2006   breast    left   Cataracts, both eyes    Depression    Depression    Diverticulosis of colon    GERD (gastroesophageal reflux disease)    Glaucoma    Hyperlipidemia    Hypertension    no meds   Joint pain    Knee pain    Obesity    OSA on CPAP    Osteoarthritis    Sleep apnea    no CPAP- no longer needed d/t weight loss   Thyroid activity decreased    Past Surgical History:  Procedure Laterality Date   BREAST SURGERY Bilateral    CATARACT EXTRACTION     COLONOSCOPY     GLAUCOMA REPAIR     MASTECTOMY, RADICAL Bilateral    neulasta induced sterile abscesses     THYROIDECTOMY, PARTIAL     TONSILLECTOMY     TOTAL KNEE ARTHROPLASTY Right    TOTAL KNEE ARTHROPLASTY Left 10/27/2022   Procedure: LEFT TOTAL KNEE ARTHROPLASTY;  Surgeon: [name], MD;  Location: [facility];  Service: Orthopedics;  Laterality: Left;   TOTAL KNEE REVISION Right 05/07/2020   Procedure: RIGHT TOTAL KNEE REVISION ARTHROPLASTY;  Surgeon: [name], MD;  Location: [facility];  Service: Orthopedics;  Laterality: Right;   Patient Active Problem List   Diagnosis Date Noted   Atherosclerosis of artery 03/12/2023   Polyphagia 03/12/2023   Adrenal nodule (HCC)  11/16/2022   Thyroid nodule 11/16/2022   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   Generalized obesity 10/05/2022   Memory loss 06/12/2022   Essential hypertension 04/13/2022   Obesity (BMI 30-39.9) 04/13/2022   Stress incontinence 11/08/2021   Status post revision of total replacement of right knee 05/07/2020   Failed total knee, right, 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/2020   History of total right knee replacement 01/28/2020   Vitamin D deficiency 08/29/2018   Insulin resistance 08/29/2018   Other specified glaucoma 08/14/2018   Incontinence of urine in female 11/07/2017   Vertigo 11/07/2017   Left knee pain 10/25/2016   Genetic testing 08/08/2016   Hypothyroidism 12/02/2015   Anxiety and depression 12/02/2015   OAB (overactive bladder) 12/02/2015   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/02/2015   BMI 37.0-37.9, adult 12/02/2015    PCP: Sheliah Hatch, MD  REFERRING PROVIDER: Kirtland Bouchard, PA-C  REFERRING DIAG:  Diagnosis  [phone] (ICD-10-CM) - Status post total left knee replacement  R26.89 (ICD-10-CM) - Abnormality of gait due to impairment of balance  THERAPY DIAG:  Chronic pain of left knee  Muscle weakness (generalized)  Difficulty in walking, not elsewhere classified  Unsteadiness on feet  Rationale for Evaluation and Treatment: Rehabilitation  ONSET DATE: 10/27/2022 TKA  SUBJECTIVE:   SUBJECTIVE STATEMENT: Pt indicated having no pain upon arrival today.  Pain yesterday was up and was hard to walk.  Pt indicated confusion on directions to make it into clinic today which led to arrival late.   Reported having a visit with personal trainer yesterday (been going for 2 months per Pt).   PERTINENT HISTORY: OA, breast CA, HLD, HTN, hypothyroid, obesity, Bil TKAs, vertigo, anxiety, depression  PAIN:  NPRS scale: 0/10 arrival.  Pain location: Lt knee Pain description: dull, aching, stiffness Aggravating factors:  prolonged static positioning (driving with knee bent, sleeping), bending to get in and out of car.  Relieving factors: movement  PRECAUTIONS: Fall  RED FLAGS: None   WEIGHT BEARING RESTRICTIONS: No  FALLS:  Has patient fallen in last 6 months? Yes. Number of falls 2  LIVING ENVIRONMENT: Lives with: lives with their spouse Lives in: House/apartment Stairs: stairs to enter with bil handrails, bike is upstairs  OCCUPATION: retired  PLOF: Independent and Leisure: Materials engineer, [employer]  PATIENT GOALS: improve pain and strength, better balance    OBJECTIVE:  Note: Objective measures were completed at Evaluation unless otherwise noted.  DIAGNOSTIC FINDINGS: Left knee 2 views: Well-seated left total knee arthroplasty components.   No acute fractures findings.  Knee is well located.  PATIENT SURVEYS:  05/16/2023: no FOTO performed due to time since evaluation.   05/01/23: FOTO deferred; pt arrived late  COGNITION: Overall cognitive status: Within functional limits for tasks assessed     SENSATION: WFL  POSTURE: rounded shoulders and forward head   LOWER EXTREMITY STRENGTH:  MMT Left/Right 05/01/2023 Right 05/16/2023 Left 05/16/2023  Hip flexion     Hip extension     Hip abduction     Hip adduction     Hip internal rotation     Hip external rotation     Knee flexion 5/4    Knee extension 5/3 5/5 56, 53.6 lbs 5/5 41, 38.6 lbs  Ankle dorsiflexion     Ankle plantarflexion     Ankle inversion     Ankle eversion      (Blank rows = not tested)  BERG TEST:  05/01/23: 42/56  FUNCTIONAL TESTS:  05/01/23: 5 times sit to stand: 16.50 sec without UE support - needed to adjust initially with difficulty rising initially  GAIT: 05/01/23:  Distance walked: 100' within clinic Assistive device utilized: None Level of assistance: SBA Comments: shuffling pattern, with decreased stance on Lt                   TODAY'S TREATMENT:        DATE: 05/16/2023 Therex: Nustep  lvl 5 UE/LE 10 mins Incline gastroc stretch 30 sec x 3 bilaterally  Seated quad set with SLR 2 x 10 bilaterally   Neuro Re-ed Tandem stance 1 min x 2 bilaterally with occasional HHA    TherActivity Leg press double leg 93 lbs x 15 c slow lowering, single leg x 15 bilaterally 37 lbs  Education on transfer technique improvements with ROM prior to standing to promote improved mobility.  Sit to stand 18 inch chair slow lowering x 5 (review for home)    TODAY'S TREATMENT:        DATE: 05/01/2023  See HEP - reviewed with pt and  encouraged her to work on resuming HEP as well as continue at gym with personal trainer.  Also recommended single limb strengthening to isolate the LLE when able.  PATIENT EDUCATION:  Education details: HEP Person educated: Patient Education method: Programmer, multimedia, Facilities manager, and Handouts Education comprehension: verbalized understanding, returned demonstration, and needs further education  HOME EXERCISE PROGRAM: Access Code: [email or web address] URL: [email or web address] Date: 05/01/2023 Prepared by: Moshe Cipro  Exercises - Supine Heel Slide (Mirrored)  - 3-5 x daily - 7 x weekly - 1 sets - 10 reps - 5 hold - Seated Long Arc Quad (Mirrored)  - 3-5 x daily - 7 x weekly - 1 sets - 5-10 reps - 2 hold - Seated Quad Set (Mirrored)  - 3-5 x daily - 7 x weekly - 1 sets - 10 reps - 5 hold - Sit to Stand  - 3 x daily - 7 x weekly - 1 sets - 10 reps - Seated Straight Leg Heel Taps  - 1-2 x daily - 7 x weekly - 3 sets - 10 reps - Heel Toe Raises with Counter Support  - 1-2 x daily - 7 x weekly - 1-2 sets - 10-15 reps - Tandem Stance  - 1 x daily - 7 x weekly - 1 sets - 3-5 reps - 30 hold  ASSESSMENT:  CLINICAL IMPRESSION: Continued review of previously existing HEP from previous treatment cycles that may continue to help improve functional strength and progressive mobility as well as in clinic balance interventions.  Pt to benefit from continued use in  HEP, gym and skilled PT services to help improve strength and balance control.    OBJECTIVE IMPAIRMENTS: Abnormal gait, decreased activity tolerance, decreased balance, decreased mobility, difficulty walking, decreased strength, and pain.   ACTIVITY LIMITATIONS: carrying, lifting, bending, standing, squatting, stairs, transfers, bed mobility, and locomotion level  PARTICIPATION LIMITATIONS: meal prep, cleaning, laundry, driving, shopping, and community activity  PERSONAL FACTORS: OA, breast CA, HLD, HTN, hypothyroid, obesity, Bil TKAs, vertigo are also affecting patient's functional outcome.   REHAB POTENTIAL: Good  CLINICAL DECISION MAKING: Evolving/moderate complexity  EVALUATION COMPLEXITY: Moderate   GOALS: Goals reviewed with patient? Yes  SHORT TERM GOALS: Target date: 05/22/2023  Independent with initial HEP Goal status: on going 05/16/2023   LONG TERM GOALS: Target date: 06/12/2023  Independent with final HEP Goal status: INITIAL  2.  LLE strength improved to 4+/5 for improved function and mobility Goal status: INIITAL  4.  Report pain < 2/10 with standing and walking activities for improved function Goal status: INITIAL  5.  Improve BERG to >/= 48/56 for decreased fall risk Goal status: INITIAL  6.  Improve 5x STS to < 14 sec for improved functional strength and mobility Goal status: INITIAL     PLAN:  PT FREQUENCY: every other week  PT DURATION: 6 weeks  PLANNED INTERVENTIONS: 97164- PT Re-evaluation, 97110-Therapeutic exercises, 97530- Therapeutic activity, [id number]- Neuromuscular re-education, 97535- Self Care, [id number]- Manual therapy, [id number]- Gait training, [phone]- Aquatic Therapy, 97014- Electrical stimulation (unattended), 97016- Vasopneumatic device, Patient/Family education, Balance training, Stair training, Taping, Dry Needling, Cryotherapy, and Moist heat  PLAN FOR NEXT SESSION:  Functional strengthening and balance improvements.   NEXT MD VISIT:  07/16/23   Chyrel Masson, PT, DPT, OCS, ATC 05/16/23  3:12 PM

## 2023-06-04 ENCOUNTER — Ambulatory Visit (INDEPENDENT_AMBULATORY_CARE_PROVIDER_SITE_OTHER): Payer: Medicare Other | Admitting: Rehabilitative and Restorative Service Providers"

## 2023-06-04 ENCOUNTER — Encounter: Payer: Self-pay | Admitting: Rehabilitative and Restorative Service Providers"

## 2023-06-04 DIAGNOSIS — M25562 Pain in left knee: Secondary | ICD-10-CM | POA: Diagnosis not present

## 2023-06-04 DIAGNOSIS — M6281 Muscle weakness (generalized): Secondary | ICD-10-CM | POA: Diagnosis not present

## 2023-06-04 DIAGNOSIS — R600 Localized edema: Secondary | ICD-10-CM | POA: Diagnosis not present

## 2023-06-04 DIAGNOSIS — R262 Difficulty in walking, not elsewhere classified: Secondary | ICD-10-CM

## 2023-06-04 DIAGNOSIS — G8929 Other chronic pain: Secondary | ICD-10-CM

## 2023-06-04 DIAGNOSIS — R2681 Unsteadiness on feet: Secondary | ICD-10-CM

## 2023-06-04 NOTE — Therapy (Addendum)
OUTPATIENT PHYSICAL THERAPY TREATMENT / DISCHARGE   Patient Name: Vanessa Christensen MRN: [id number] DOB:12-Dec-1950, 72 y.o., female Today's Date: 06/04/2023  END OF SESSION:  PT End of Session - 06/04/23 1151     Visit Number 3    Number of Visits 3    Date for PT Re-Evaluation 06/12/23    Authorization Type Medicare/BCBS Federal    Progress Note Due on Visit 10    PT Start Time 1144    PT Stop Time 1223    PT Time Calculation (min) 39 min    Activity Tolerance Patient tolerated treatment well    Behavior During Therapy WFL for tasks assessed/performed               Past Medical History:  Diagnosis Date   Anemia    hx of  iron deficient   Anxiety    pt denies   Arthritis    hands and feet   Cancer (HCC) 10/04/2006   breast    left   Cataracts, both eyes    Depression    Depression    Diverticulosis of colon    GERD (gastroesophageal reflux disease)    Glaucoma    Hyperlipidemia    Hypertension    no meds   Joint pain    Knee pain    Obesity    OSA on CPAP    Osteoarthritis    Sleep apnea    no CPAP- no longer needed d/t weight loss   Thyroid activity decreased    Past Surgical History:  Procedure Laterality Date   BREAST SURGERY Bilateral    CATARACT EXTRACTION     COLONOSCOPY     GLAUCOMA REPAIR     MASTECTOMY, RADICAL Bilateral    neulasta induced sterile abscesses     THYROIDECTOMY, PARTIAL     TONSILLECTOMY     TOTAL KNEE ARTHROPLASTY Right    TOTAL KNEE ARTHROPLASTY Left 10/27/2022   Procedure: LEFT TOTAL KNEE ARTHROPLASTY;  Surgeon: [name], MD;  Location: [facility];  Service: Orthopedics;  Laterality: Left;   TOTAL KNEE REVISION Right 05/07/2020   Procedure: RIGHT TOTAL KNEE REVISION ARTHROPLASTY;  Surgeon: [name], MD;  Location: [facility];  Service: Orthopedics;  Laterality: Right;   Patient Active Problem List   Diagnosis Date Noted   Atherosclerosis of artery 03/12/2023   Polyphagia 03/12/2023   Adrenal  nodule (HCC) 11/16/2022   Thyroid nodule 11/16/2022   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   Generalized obesity 10/05/2022   Memory loss 06/12/2022   Essential hypertension 04/13/2022   Obesity (BMI 30-39.9) 04/13/2022   Stress incontinence 11/08/2021   Status post revision of total replacement of right knee 05/07/2020   Failed total knee, right, 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/2020   History of total right knee replacement 01/28/2020   Vitamin D deficiency 08/29/2018   Insulin resistance 08/29/2018   Other specified glaucoma 08/14/2018   Incontinence of urine in female 11/07/2017   Vertigo 11/07/2017   Left knee pain 10/25/2016   Genetic testing 08/08/2016   Hypothyroidism 12/02/2015   Anxiety and depression 12/02/2015   OAB (overactive bladder) 12/02/2015   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/02/2015   BMI 37.0-37.9, adult 12/02/2015    PCP: Sheliah Hatch, MD  REFERRING PROVIDER: Kirtland Bouchard, PA-C  REFERRING DIAG:  Diagnosis  [phone] (ICD-10-CM) - Status post total left knee replacement  R26.89 (ICD-10-CM) - Abnormality of gait due to impairment of  balance    THERAPY DIAG:  Chronic pain of left knee  Muscle weakness (generalized)  Difficulty in walking, not elsewhere classified  Unsteadiness on feet  Localized edema  Rationale for Evaluation and Treatment: Rehabilitation  ONSET DATE: 10/27/2022 TKA  SUBJECTIVE:   SUBJECTIVE STATEMENT: Pt indicated feeling some complaints with getting out of sitting position. Pt indicated trying to do more movement with HEP when sitting.  Drove to [place] which was troublesome.   PERTINENT HISTORY: OA, breast CA, HLD, HTN, hypothyroid, obesity, Bil TKAs, vertigo, anxiety, depression  PAIN:  NPRS scale: at worst 2/10 Pain location: Lt knee Pain description: dull, aching, stiffness Aggravating factors: prolonged static positioning (driving with knee bent, sleeping),  bending to get in and out of car.  Relieving factors: movement  PRECAUTIONS: Fall  RED FLAGS: None   WEIGHT BEARING RESTRICTIONS: No  FALLS:  Has patient fallen in last 6 months? Yes. Number of falls 2  LIVING ENVIRONMENT: Lives with: lives with their spouse Lives in: House/apartment Stairs: stairs to enter with bil handrails, bike is upstairs  OCCUPATION: retired  PLOF: Independent and Leisure: Materials engineer, [employer]  PATIENT GOALS: improve pain and strength, better balance    OBJECTIVE:  Note: Objective measures were completed at Evaluation unless otherwise noted.  DIAGNOSTIC FINDINGS: Left knee 2 views: Well-seated left total knee arthroplasty components.   No acute fractures findings.  Knee is well located.  PATIENT SURVEYS:  05/16/2023: no FOTO performed due to time since evaluation.   05/01/23: FOTO deferred; pt arrived late  COGNITION: Overall cognitive status: Within functional limits for tasks assessed     SENSATION: WFL  POSTURE: rounded shoulders and forward head   LOWER EXTREMITY STRENGTH:  MMT Left/Right 05/01/2023 Right 05/16/2023 Left 05/16/2023 Right 06/04/2023 Left 06/04/2023  Hip flexion       Hip extension       Hip abduction       Hip adduction       Hip internal rotation       Hip external rotation       Knee flexion 5/4      Knee extension 5/3 5/5 56, 53.6 lbs 5/5 41, 38.6 lbs 5/5 63.9 lbs 5/5 46.6, 45.8 lbs  Ankle dorsiflexion       Ankle plantarflexion       Ankle inversion       Ankle eversion        (Blank rows = not tested)  BERG TEST:  05/01/23: 42/56  FUNCTIONAL TESTS:  05/01/23: 5 times sit to stand: 16.50 sec without UE support - needed to adjust initially with difficulty rising initially  GAIT: 05/01/23:  Distance walked: 100' within clinic Assistive device utilized: None Level of assistance: SBA Comments: shuffling pattern, with decreased stance on Lt                   TODAY'S TREATMENT:        DATE:  06/04/2023 Therex: Nustep lvl 6 UE/LE 5 mins, lvl 5 5 mins  10 mins total.  Seated SLR Lt 2 x 10 with focus on keeping knee straight.   Continued verbal review and emphasis on existing HEP for management of tightness and progressive strengthening. Additional time spent in review and question and answer about activity frequency and use.   TherActivity Leg press double leg 93 lbs x 15 c slow lowering, single leg 2 x 15 bilaterally 37 lbs  Sit to stand to sit 20 inch table height x 5 (cues for  slow   TODAY'S TREATMENT:        DATE: 05/16/2023 Therex: Nustep lvl 5 UE/LE 10 mins Incline gastroc stretch 30 sec x 3 bilaterally  Seated quad set with SLR 2 x 10 bilaterally   Neuro Re-ed Tandem stance 1 min x 2 bilaterally with occasional HHA    TherActivity Leg press double leg 93 lbs x 15 c slow lowering, single leg x 15 bilaterally 37 lbs  Education on transfer technique improvements with ROM prior to standing to promote improved mobility.  Sit to stand 18 inch chair slow lowering x 5 (review for home)    TODAY'S TREATMENT:        DATE: 05/01/2023  See HEP - reviewed with pt and encouraged her to work on resuming HEP as well as continue at gym with personal trainer.  Also recommended single limb strengthening to isolate the LLE when able.  PATIENT EDUCATION:  Education details: HEP Person educated: Patient Education method: Programmer, multimedia, Facilities manager, and Handouts Education comprehension: verbalized understanding, returned demonstration, and needs further education  HOME EXERCISE PROGRAM: Access Code: [email or web address] URL: [email or web address] Date: 05/01/2023 Prepared by: Moshe Cipro  Exercises - Supine Heel Slide (Mirrored)  - 3-5 x daily - 7 x weekly - 1 sets - 10 reps - 5 hold - Seated Long Arc Quad (Mirrored)  - 3-5 x daily - 7 x weekly - 1 sets - 5-10 reps - 2 hold - Seated Quad Set (Mirrored)  - 3-5 x daily - 7 x weekly - 1 sets - 10 reps - 5 hold - Sit to  Stand  - 3 x daily - 7 x weekly - 1 sets - 10 reps - Seated Straight Leg Heel Taps  - 1-2 x daily - 7 x weekly - 3 sets - 10 reps - Heel Toe Raises with Counter Support  - 1-2 x daily - 7 x weekly - 1-2 sets - 10-15 reps - Tandem Stance  - 1 x daily - 7 x weekly - 1 sets - 3-5 reps - 30 hold  ASSESSMENT:  CLINICAL IMPRESSION: Strength testing showed improvement compared to previous checks.  Lt still lower than Rt although improved.  Spent visit in review again of at home importance of continued HEP as well as use of gym based equipment for continued gains.    OBJECTIVE IMPAIRMENTS: Abnormal gait, decreased activity tolerance, decreased balance, decreased mobility, difficulty walking, decreased strength, and pain.   ACTIVITY LIMITATIONS: carrying, lifting, bending, standing, squatting, stairs, transfers, bed mobility, and locomotion level  PARTICIPATION LIMITATIONS: meal prep, cleaning, laundry, driving, shopping, and community activity  PERSONAL FACTORS: OA, breast CA, HLD, HTN, hypothyroid, obesity, Bil TKAs, vertigo are also affecting patient's functional outcome.   REHAB POTENTIAL: Good  CLINICAL DECISION MAKING: Evolving/moderate complexity  EVALUATION COMPLEXITY: Moderate   GOALS: Goals reviewed with patient? Yes  SHORT TERM GOALS: Target date: 05/22/2023  Independent with initial HEP Goal status: on going 05/16/2023   LONG TERM GOALS: Target date: 06/12/2023  Independent with final HEP Goal status:  on going 06/04/2023  2.  LLE strength improved to 4+/5 for improved function and mobility Goal status: Met 06/04/2023  4.  Report pain < 2/10 with standing and walking activities for improved function Goal status: met 06/04/2023  5.  Improve BERG to >/= 48/56 for decreased fall risk Goal status: on going 06/04/2023  6.  Improve 5x STS to < 14 sec for improved functional strength and mobility Goal status: on going 06/04/2023  PLAN:  PT FREQUENCY: every other  week  PT DURATION: 6 weeks  PLANNED INTERVENTIONS: 97164- PT Re-evaluation, 97110-Therapeutic exercises, 97530- Therapeutic activity, [id number]- Neuromuscular re-education, 97535- Self Care, 16109- Manual therapy, [id number]- Gait training, [phone]- Aquatic Therapy, 97014- Electrical stimulation (unattended), 97016- Vasopneumatic device, Patient/Family education, Balance training, Stair training, Taping, Dry Needling, Cryotherapy, and Moist heat  PLAN FOR NEXT SESSION:  Trial HEP period.  Recert required upon return.  Discharge after 30 days inactivity.   NEXT MD VISIT: 07/16/23   Chyrel Masson, PT, DPT, OCS, ATC 06/04/23  12:34 PM   PHYSICAL THERAPY DISCHARGE SUMMARY  Visits from Start of Care: 3  Current functional level related to goals / functional outcomes: See note   Remaining deficits: See note   Education / Equipment: HEP  Patient goals were partially met. Patient is being discharged due to being pleased with the current functional level.  Chyrel Masson, PT, DPT, OCS, ATC 07/04/23  3:37 PM

## 2023-06-12 ENCOUNTER — Other Ambulatory Visit: Payer: Self-pay | Admitting: Family Medicine

## 2023-06-12 ENCOUNTER — Other Ambulatory Visit: Payer: Self-pay

## 2023-06-12 MED ORDER — TRAZODONE HCL 50 MG PO TABS: 25.0000 mg | ORAL_TABLET | Freq: Every evening | ORAL | 2 refills | Status: AC | PRN

## 2023-06-12 MED ORDER — FLUOXETINE HCL 40 MG PO CAPS: 40.0000 mg | ORAL_CAPSULE | Freq: Every day | ORAL | 3 refills | Status: DC

## 2023-06-12 NOTE — Telephone Encounter (Signed)
 Please advise looks like this patient may be seeing Corinna Capra for her care at this time

## 2023-06-13 ENCOUNTER — Ambulatory Visit: Payer: Medicare Other | Admitting: Bariatrics

## 2023-06-13 ENCOUNTER — Encounter: Payer: Self-pay | Admitting: Bariatrics

## 2023-06-13 VITALS — BP 150/81 | HR 76 | Temp 97.8°F | Ht 62.0 in | Wt 197.0 lb

## 2023-06-13 DIAGNOSIS — E669 Obesity, unspecified: Secondary | ICD-10-CM | POA: Diagnosis not present

## 2023-06-13 DIAGNOSIS — I7090 Unspecified atherosclerosis: Secondary | ICD-10-CM | POA: Diagnosis not present

## 2023-06-13 DIAGNOSIS — R632 Polyphagia: Secondary | ICD-10-CM | POA: Diagnosis not present

## 2023-06-13 DIAGNOSIS — Z6836 Body mass index (BMI) 36.0-36.9, adult: Secondary | ICD-10-CM

## 2023-06-13 MED ORDER — WEGOVY 0.25 MG/0.5ML ~~LOC~~ SOAJ: 0.2500 mg | SUBCUTANEOUS | 0 refills | Status: DC

## 2023-06-13 NOTE — Progress Notes (Signed)
 WEIGHT SUMMARY AND BIOMETRICS  Weight Lost Since Last Visit: 3lb  Weight Gained Since Last Visit: 0   Vitals Temp: 97.8 F (36.6 C) BP: (!) 150/81 Pulse Rate: 76 SpO2: 99 %   Anthropometric Measurements Height: 5' 2 (1.575 m) Weight: 197 lb (89.4 kg) BMI (Calculated): 36.02 Weight at Last Visit: 200lb Weight Lost Since Last Visit: 3lb Weight Gained Since Last Visit: 0 Starting Weight: 241lb Total Weight Loss (lbs): 44 lb (20 kg)   Body Composition  Body Fat %: 45.9 % Fat Mass (lbs): 90.6 lbs Muscle Mass (lbs): 101.2 lbs Total Body Water  (lbs): 73.2 lbs Visceral Fat Rating : 15   Other Clinical Data Fasting: no Labs: no Today's Visit #: 70 Starting Date: 08/13/18    OBESITY Medha is here to discuss her progress with her obesity treatment plan along with follow-up of her obesity related diagnoses.    Nutrition Plan: the Category 3 plan - 30% adherence.  Current exercise:  Systems analyst.   Interim History:  She is down 3 lbs since her last visit. She is taking Wegovy  and states that she is never hungry.  Eating all of the food on the plan., Protein intake is as prescribed, Water  intake is inadequate., and Denies polyphagia   Pharmacotherapy: Vonceil is on Wegovy  0.25 mg SQ weekly Adverse side effects: None Hunger is well controlled.  Cravings are well controlled.  Assessment/Plan:   1. Polyphagia Polyphagia Clarice endorses excessive hunger.  Medication(s): Wegovy  Effects of medication:  well controlled. Cravings are moderately controlled.   Plan: Medication(s): Wegovy  0.25 mg SQ weekly. Will stay on the same dose because of lack of appetite.  Will increase water , protein and fiber to help assuage hunger.  Will minimize foods that have a high glucose index/load to minimize reactive hypoglycemia.   2. Atherosclerosis of  artery  Sees her PCP for her cardiovascular health.   Plan:  She will continue care with her PCP and all medications.     Generalized Obesity: Current BMI BMI (Calculated): 36.02   Pharmacotherapy Plan Continue  Wegovy  0.25 mg SQ weekly  Kataryna is currently in the action stage of change. As such, her goal is to continue with weight loss efforts.  She has agreed to the Category 3 plan.  Exercise goals: Older adults should determine their level of effort for physical activity relative to their level of fitness.  She is going to PT at this time. She is seeing a systems analyst.   Behavioral modification strategies: increasing lean protein intake, meal planning , increase water  intake, better snacking choices, planning for success, decrease snacking , keep healthy foods in the home, and mindful eating.  Charika has agreed to follow-up with our clinic in 4 weeks.   No orders of the defined types were placed in this encounter.   Medications Discontinued During This Encounter  Medication Reason  aspirin  81 MG chewable tablet Patient Preference   Semaglutide -Weight Management (WEGOVY ) 0.25 MG/0.5ML SOAJ Patient Preference     No orders of the defined types were placed in this encounter.     Objective:   VITALS: Per patient if applicable, see vitals. GENERAL: Alert and in no acute distress. CARDIOPULMONARY: No increased WOB. Speaking in clear sentences.  PSYCH: Pleasant and cooperative. Speech normal rate and rhythm. Affect is appropriate. Insight and judgement are appropriate. Attention is focused, linear, and appropriate.  NEURO: Oriented as arrived to appointment on time with no prompting.   Attestation Statements:    This was prepared with the assistance of Engineer, Civil (consulting).  Occasional wrong-word or sound-a-like substitutions may have occurred due to the inherent limitations of voice recognition   Clayborne Daring, DO

## 2023-07-16 ENCOUNTER — Encounter: Payer: Self-pay | Admitting: Physician Assistant

## 2023-07-16 ENCOUNTER — Ambulatory Visit (INDEPENDENT_AMBULATORY_CARE_PROVIDER_SITE_OTHER): Payer: Medicare Other | Admitting: Physician Assistant

## 2023-07-16 DIAGNOSIS — Z96652 Presence of left artificial knee joint: Secondary | ICD-10-CM | POA: Diagnosis not present

## 2023-07-16 NOTE — Progress Notes (Signed)
 HPI: Ms. Kondracki returns today follow-up of her left total knee arthroplasty.  She underwent a left total knee arthroplasty on 10/27/2022.  Last visit she was having problems with gait and balance.  She went to therapy and feels that overall she is doing much better.  She is finished with therapy she is back at the gym working with a trainer.  She is having no pain in the knee.  Review of systems: See HPI otherwise negative or noncontributory.  Physical exam: General Well-developed well-nourished female no acute distress.  Affect appropriate.  Ambulates without any assistive device. Left knee: Full extension full flexion.  No instability valgus varus stressing.  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  Will see her back at 1 year postop and obtain an AP and lateral view of her left knee.  Will see her sooner if there is any questions concerns.

## 2023-07-17 ENCOUNTER — Encounter: Payer: Self-pay | Admitting: Bariatrics

## 2023-07-17 ENCOUNTER — Ambulatory Visit (INDEPENDENT_AMBULATORY_CARE_PROVIDER_SITE_OTHER): Payer: Medicare Other | Admitting: Bariatrics

## 2023-07-17 VITALS — BP 160/78 | HR 84 | Temp 98.1°F | Ht 62.0 in | Wt 200.0 lb

## 2023-07-17 DIAGNOSIS — R632 Polyphagia: Secondary | ICD-10-CM | POA: Diagnosis not present

## 2023-07-17 DIAGNOSIS — I10 Essential (primary) hypertension: Secondary | ICD-10-CM | POA: Diagnosis not present

## 2023-07-17 DIAGNOSIS — Z6836 Body mass index (BMI) 36.0-36.9, adult: Secondary | ICD-10-CM | POA: Diagnosis not present

## 2023-07-17 DIAGNOSIS — E669 Obesity, unspecified: Secondary | ICD-10-CM

## 2023-07-17 DIAGNOSIS — I7090 Unspecified atherosclerosis: Secondary | ICD-10-CM

## 2023-07-17 DIAGNOSIS — E66812 Obesity, class 2: Secondary | ICD-10-CM

## 2023-07-17 MED ORDER — WEGOVY 0.5 MG/0.5ML ~~LOC~~ SOAJ: 0.5000 mg | SUBCUTANEOUS | 0 refills | Status: DC

## 2023-07-17 NOTE — Progress Notes (Signed)
WEIGHT SUMMARY AND BIOMETRICS  Weight Lost Since Last Visit: 0  Weight Gained Since Last Visit: 3lb   Vitals Temp: 98.1 F (36.7 C) BP: (!) 160/78 Pulse Rate: 84 SpO2: 98 %   Anthropometric Measurements Height: 5\' 2"  (1.575 m) Weight: 200 lb (90.7 kg) BMI (Calculated): 36.57 Weight at Last Visit: 197lb Weight Lost Since Last Visit: 0 Weight Gained Since Last Visit: 3lb Starting Weight: 241lb Total Weight Loss (lbs): 41 lb (18.6 kg)   Body Composition  Body Fat %: 47.8 % Fat Mass (lbs): 96 lbs Muscle Mass (lbs): 99.4 lbs Total Body Water (lbs): 76.6 lbs Visceral Fat Rating : 16   Other Clinical Data Fasting: no Labs: no Today's Visit #: 8 Starting Date: 08/13/18    OBESITY Vanessa Christensen is here to discuss her progress with her obesity treatment plan along with follow-up of her obesity related diagnoses.    Nutrition Plan: the Category 3 plan - 40% adherence.  Current exercise:  personal training   Interim History:  She is up 3 lbs since her last visit.  Eating all of the food on the plan., Is not skipping meals, and Water intake is adequate.   Pharmacotherapy: Florenda is on Wegovy 0.25 mg SQ weekly Adverse side effects: None Hunger is moderately controlled.  Cravings are poorly controlled.  Assessment/Plan:   1. Polyphagia Polyphagia Aleysia endorses excessive hunger.  Medication(s): ZOXWRU Effects of medication:  moderately controlled. Cravings are poorly controlled.   Plan: Medication(s): Wegovy 0.50 mg SQ weekly Will increase water, protein and fiber to help assuage hunger.  Will minimize foods that have a high glucose index/load to minimize reactive hypoglycemia.    2. Atherosclerosis of artery:  She has a history of atherosclerosis.  See medications.  Blood pressure was elevated today.   Plan:   She will continue her plan and  exercise.  Will follow-up with her PCP in regard to her blood pressure tomorrow   Hypertension Hypertension poorly controlled.  Medication(s): none  BP Readings from Last 3 Encounters:  07/17/23 (!) 160/78  06/13/23 (!) 150/81  05/14/23 (!) 157/75   Lab Results  Component Value Date   CREATININE 0.78 01/15/2023   CREATININE 0.70 11/10/2022   CREATININE 0.73 11/10/2022   Lab Results  Component Value Date   GFR 74.97 01/15/2023   GFR 84.48 02/08/2022   GFR 82.01 07/27/2021    She will see her primary care physician tomorrow per patient and will discuss her elevated blood pressure readings. She will check her blood pressure tonight and tomorrow and will follow-up with her PCP.  She may need to start on a medication for blood pressure.  She has a blood pressure cuff at home and will start to check her blood pressures either early in the morning and or in the late afternoon and will record.  She will resume her Silver sneakers on  Monday Wednesday and Fridays.  She is currently doing both cardio and resistance training and will continue.  No added salt. Will keep sodium content to 1,500 mg or less per day.     Generalized Obesity: Current BMI BMI (Calculated): 36.57   Pharmacotherapy Plan Continue and increase dose  Wegovy 0.50 mg SQ weekly  Vanessa Christensen is currently in the action stage of change. As such, her goal is to continue with weight loss efforts.  She has agreed to the Category 3 plan.  Exercise goals: Older adults should follow the adult guidelines. When older adults cannot meet the adult guidelines, they should be as physically active as their abilities and conditions will allow.   Behavioral modification strategies: increasing lean protein intake, decreasing simple carbohydrates , no meal skipping, meal planning , planning for success, and keep healthy foods in the home.  Jeanene has agreed to follow-up with our clinic in 4 weeks.     Medications Discontinued During This  Encounter  Medication Reason   Semaglutide-Weight Management (WEGOVY) 0.25 MG/0.5ML SOAJ      Meds ordered this encounter  Medications   Semaglutide-Weight Management (WEGOVY) 0.5 MG/0.5ML SOAJ    Sig: Inject 0.5 mg into the skin once a week.    Dispense:  2 mL    Refill:  0      Objective:   VITALS: Per patient if applicable, see vitals. GENERAL: Alert and in no acute distress. CARDIOPULMONARY: No increased WOB. Speaking in clear sentences.  PSYCH: Pleasant and cooperative. Speech normal rate and rhythm. Affect is appropriate. Insight and judgement are appropriate. Attention is focused, linear, and appropriate.  NEURO: Oriented as arrived to appointment on time with no prompting.   Attestation Statements:    This was prepared with the assistance of Engineer, civil (consulting).  Occasional wrong-word or sound-a-like substitutions may have occurred due to the inherent limitations of voice recognition   Corinna Capra, DO

## 2023-07-18 ENCOUNTER — Ambulatory Visit: Payer: Medicare Other | Admitting: Family Medicine

## 2023-07-18 ENCOUNTER — Encounter: Payer: Self-pay | Admitting: Family Medicine

## 2023-07-18 VITALS — BP 132/82 | HR 82 | Temp 98.2°F | Ht 62.0 in | Wt 202.4 lb

## 2023-07-18 DIAGNOSIS — U071 COVID-19: Secondary | ICD-10-CM | POA: Diagnosis not present

## 2023-07-18 LAB — POC INFLUENZA A&B (BINAX/QUICKVUE)
Influenza A, POC: NEGATIVE
Influenza B, POC: NEGATIVE

## 2023-07-18 LAB — POC COVID19 BINAXNOW: SARS Coronavirus 2 Ag: POSITIVE — AB

## 2023-07-18 MED ORDER — MIRABEGRON ER 25 MG PO TB24: 25.0000 mg | ORAL_TABLET | Freq: Every day | ORAL | 3 refills | Status: DC

## 2023-07-18 MED ORDER — NIRMATRELVIR/RITONAVIR (PAXLOVID)TABLET: 3.0000 | ORAL_TABLET | Freq: Two times a day (BID) | ORAL | 0 refills | Status: AC

## 2023-07-18 NOTE — Progress Notes (Signed)
   Subjective:    Patient ID: Vanessa Christensen, female    DOB: Mar 21, 1951, 73 y.o.   MRN: [id number]  HPI URI- sxs started last night.  Started w/ head congestion, sore throat.  Bilateral ear fullness and ringing.  + fatigue.  Denies body aches.  + HA.  No cough.     Review of Systems For ROS see HPI     Objective:   Physical Exam Vitals reviewed.  Constitutional:      General: She is not in acute distress.    Appearance: She is well-developed. She is not ill-appearing.  HENT:     Head: Normocephalic and atraumatic.  Eyes:     Conjunctiva/sclera: Conjunctivae normal.     Pupils: Pupils are equal, round, and reactive to light.  Neck:     Thyroid: No thyromegaly.  Cardiovascular:     Rate and Rhythm: Normal rate and regular rhythm.     Heart sounds: Normal heart sounds. No murmur heard. Pulmonary:     Effort: Pulmonary effort is normal. No respiratory distress.     Breath sounds: Normal breath sounds.  Abdominal:     General: There is no distension.     Palpations: Abdomen is soft.     Tenderness: There is no abdominal tenderness.  Musculoskeletal:     Cervical back: Normal range of motion and neck supple.  Lymphadenopathy:     Cervical: No cervical adenopathy.  Skin:    General: Skin is warm and dry.  Neurological:     General: No focal deficit present.     Mental Status: She is alert and oriented to person, place, and time.  Psychiatric:        Mood and Affect: Mood normal.        Behavior: Behavior normal.        Thought Content: Thought content normal.           Assessment & Plan:  COVID- new.  Pt reports not feeling well as of yesterday.  Rapid COVID test immediately +.  Start Paxlovid.  Hold Crestor.  Reviewed supportive care and red flags that should prompt return.  Pt expressed understanding and is in agreement w/ plan.

## 2023-07-18 NOTE — Patient Instructions (Signed)
Follow up in 6 months to recheck BP and cholesterol START the Paxlovid as directed HOLD your Rosuvastatin while on the Paxlovid and then restart when you are finished Drink LOTS of water REST! Call with any questions or concerns Hang in there!!!

## 2023-08-15 ENCOUNTER — Encounter: Payer: Self-pay | Admitting: Bariatrics

## 2023-08-15 ENCOUNTER — Ambulatory Visit (INDEPENDENT_AMBULATORY_CARE_PROVIDER_SITE_OTHER): Payer: Medicare Other | Admitting: Bariatrics

## 2023-08-15 VITALS — BP 147/83 | HR 80 | Temp 98.2°F | Ht 62.0 in | Wt 199.0 lb

## 2023-08-15 DIAGNOSIS — R632 Polyphagia: Secondary | ICD-10-CM

## 2023-08-15 DIAGNOSIS — Z6836 Body mass index (BMI) 36.0-36.9, adult: Secondary | ICD-10-CM

## 2023-08-15 DIAGNOSIS — I10 Essential (primary) hypertension: Secondary | ICD-10-CM

## 2023-08-15 MED ORDER — WEGOVY 0.5 MG/0.5ML ~~LOC~~ SOAJ: 0.5000 mg | SUBCUTANEOUS | 0 refills | Status: DC

## 2023-08-15 NOTE — Progress Notes (Signed)
 WEIGHT SUMMARY AND BIOMETRICS  Weight Lost Since Last Visit: 1  Weight Gained Since Last Visit: 0   Vitals Temp: 98.2 F (36.8 C) BP: (!) 147/83 Pulse Rate: 80 SpO2: 98 %   Anthropometric Measurements Height: 5\' 2"  (1.575 m) Weight: 199 lb (90.3 kg) BMI (Calculated): 36.39 Weight at Last Visit: 200 lb Weight Lost Since Last Visit: 1 Weight Gained Since Last Visit: 0 Starting Weight: 241 lb Total Weight Loss (lbs): 42 lb (19.1 kg)   Body Composition  Body Fat %: 46.2 % Fat Mass (lbs): 92.4 lbs Muscle Mass (lbs): 102 lbs Total Body Water (lbs): 75 lbs Visceral Fat Rating : 15   Other Clinical Data Today's Visit #: 23 Starting Date: 08/13/18    OBESITY Vanessa Christensen is here to discuss her progress with her obesity treatment plan along with follow-up of her obesity related diagnoses.    Nutrition Plan: the Category 3 plan - 40% adherence.  Current exercise: Personal Trainer 2 times a week  Interim History:  She is down 1 lb since her last visit. She is working with her trainer doing cardio and resistance.  Eating all of the food on the plan., Protein intake is as prescribed, Is not skipping meals, and Water intake is adequate.   Pharmacotherapy: Aariyana is on Wegovy 0.50 mg SQ weekly Adverse side effects: None Hunger is moderately controlled.  Cravings are moderately controlled.  Assessment/Plan:    Hypertension Hypertension needs improvement.  Medication(s): none  BP Readings from Last 3 Encounters:  08/15/23 (!) 147/83  07/18/23 132/82  07/17/23 (!) 160/78   Lab Results  Component Value Date   CREATININE 0.78 01/15/2023   CREATININE 0.70 11/10/2022   CREATININE 0.73 11/10/2022   Lab Results  Component Value Date   GFR 74.97 01/15/2023   GFR 84.48 02/08/2022   GFR 82.01 07/27/2021    Plan: Continue all antihypertensives at current  dosages. No added salt. Will keep sodium content to 1,500 mg or less per day.    Polyphagia [name] endorses excessive hunger.  Medication(s): QMVHQI  Effects of medication:  moderately controlled. Cravings are moderately controlled.   Plan: Medication(s): Wegovy 0.50 mg SQ weekly Will increase water, protein and fiber to help assuage hunger.  Will minimize foods that have a high glucose index/load to minimize reactive hypoglycemia.  She will continue to cook more meals at home.  She will continue to work out on a regular basis.     Morbid Obesity: Current BMI BMI (Calculated): 36.39   Pharmacotherapy Plan Continue  Wegovy 0.50 mg SQ weekly She thinks that it is helping her appetite.   Vanessa Christensen is currently in the action stage of change. As such, her goal is to continue with weight loss efforts.  She has agreed to the Category 3 plan.  Exercise goals: Older adults should determine their level of effort for physical activity  relative to their level of fitness.   Behavioral modification strategies: increasing lean protein intake, decreasing simple carbohydrates , no meal skipping, meal planning , decrease liquid calories, increase water intake, planning for success, increasing vegetables, decrease snacking , and keep healthy foods in the home.  Vanessa Christensen has agreed to follow-up with our clinic in 4 weeks.      Objective:   VITALS: Per patient if applicable, see vitals. GENERAL: Alert and in no acute distress. CARDIOPULMONARY: No increased WOB. Speaking in clear sentences.  PSYCH: Pleasant and cooperative. Speech normal rate and rhythm. Affect is appropriate. Insight and judgement are appropriate. Attention is focused, linear, and appropriate.  NEURO: Oriented as arrived to appointment on time with no prompting.   Attestation Statements:    This was prepared with the assistance of Engineer, civil (consulting).  Occasional wrong-word or sound-a-like substitutions may have occurred due to  the inherent limitations of voice recognition   Corinna Capra, DO

## 2023-09-10 ENCOUNTER — Other Ambulatory Visit: Payer: Self-pay | Admitting: Family Medicine

## 2023-09-10 DIAGNOSIS — E785 Hyperlipidemia, unspecified: Secondary | ICD-10-CM

## 2023-09-12 ENCOUNTER — Encounter: Payer: Self-pay | Admitting: Bariatrics

## 2023-09-12 ENCOUNTER — Ambulatory Visit (INDEPENDENT_AMBULATORY_CARE_PROVIDER_SITE_OTHER): Admitting: Bariatrics

## 2023-09-12 VITALS — BP 174/73 | HR 77 | Temp 97.9°F | Ht 62.0 in | Wt 201.0 lb

## 2023-09-12 DIAGNOSIS — E669 Obesity, unspecified: Secondary | ICD-10-CM | POA: Diagnosis not present

## 2023-09-12 DIAGNOSIS — R632 Polyphagia: Secondary | ICD-10-CM

## 2023-09-12 DIAGNOSIS — E039 Hypothyroidism, unspecified: Secondary | ICD-10-CM | POA: Diagnosis not present

## 2023-09-12 DIAGNOSIS — E038 Other specified hypothyroidism: Secondary | ICD-10-CM

## 2023-09-12 DIAGNOSIS — E66812 Obesity, class 2: Secondary | ICD-10-CM

## 2023-09-12 DIAGNOSIS — Z6836 Body mass index (BMI) 36.0-36.9, adult: Secondary | ICD-10-CM | POA: Diagnosis not present

## 2023-09-12 MED ORDER — WEGOVY 1 MG/0.5ML ~~LOC~~ SOAJ: 1.0000 mg | SUBCUTANEOUS | 0 refills | Status: DC

## 2023-09-12 NOTE — Progress Notes (Signed)
 WEIGHT SUMMARY AND BIOMETRICS  Weight Lost Since Last Visit: 0  Weight Gained Since Last Visit: 2lb   Vitals Temp: 97.9 F (36.6 C) BP: (!) 174/73 Pulse Rate: 77 SpO2: 99 %   Anthropometric Measurements Height: 5\' 2"  (1.575 m) Weight: 201 lb (91.2 kg) BMI (Calculated): 36.75 Weight at Last Visit: 199lb Weight Lost Since Last Visit: 0 Weight Gained Since Last Visit: 2lb Starting Weight: 241lb Total Weight Loss (lbs): 40 lb (18.1 kg)   Body Composition  Body Fat %: 46.7 % Fat Mass (lbs): 94.2 lbs Muscle Mass (lbs): 102 lbs Total Body Water (lbs): 74.8 lbs Visceral Fat Rating : 15   Other Clinical Data Fasting: no Labs: no Today's Visit #: 66 Starting Date: 08/13/18    OBESITY Vanessa Christensen is here to discuss her progress with her obesity treatment plan along with follow-up of her obesity related diagnoses.    Nutrition Plan: the Category 3 plan - 50% adherence.  Current exercise:  Personal training.   Interim History:  She is up 2 lbs since her last visit.  Eating all of the food on the plan., Protein intake is as prescribed, and Water intake is inadequate.   Pharmacotherapy: Vanessa Christensen is on Wegovy 0.50 mg SQ weekly Adverse side effects: None Hunger is poorly controlled.  Cravings are poorly controlled.  Assessment/Plan:   Hypothyroidism Stable.  Does not report symptoms associated with uncontrolled hypothyroidism. Medication(s): Levothyroxine 75 mcg daily. Lab Results  Component Value Date   TSH 1.12 01/15/2023    Plan: Continue levothyroxine at current dose. Counseling: The correct way to take levothyroxine is fasting, with water, separated by at least 30 minutes from breakfast, and separated by more than 4 hours from calcium, iron, multivitamins, acid reflux medications (PPIs).  She will continue her exercise regimen.   Polyphagia Vanessa Christensen  endorses excessive hunger.  Medication(s): Vanessa Christensen Effects of medication:  moderately controlled. Cravings are moderately controlled.   Plan: Medication(s): Increase Wegovy 1.0 mg SQ weekly Will increase water, protein and fiber to help assuage hunger.  Will minimize foods that have a high glucose index/load to minimize reactive hypoglycemia.     Generalized Obesity: Current BMI BMI (Calculated): 36.75   Pharmacotherapy Plan Start  Wegovy 1.0 mg SQ weekly  Vanessa Christensen is currently in the action stage of change. As such, her goal is to continue with weight loss efforts.  She has agreed to the Category 3 plan.  Exercise goals: Older adults should determine their level of effort for physical activity relative to their level of fitness.   Behavioral modification strategies: increasing lean protein intake, decreasing simple carbohydrates , no meal skipping, decrease eating out, meal planning , increase water intake, better snacking choices, planning for success, increasing fiber rich foods, avoiding temptations, keep healthy foods in the home, and mindful eating.  [name] has agreed to follow-up with our clinic in 4  weeks.     Objective:   VITALS: Per patient if applicable, see vitals. GENERAL: Alert and in no acute distress. CARDIOPULMONARY: No increased WOB. Speaking in clear sentences.  PSYCH: Pleasant and cooperative. Speech normal rate and rhythm. Affect is appropriate. Insight and judgement are appropriate. Attention is focused, linear, and appropriate.  NEURO: Oriented as arrived to appointment on time with no prompting.   Attestation Statements:   This was prepared with the assistance of Engineer, civil (consulting).  Occasional wrong-word or sound-a-like substitutions may have occurred due to the inherent limitations of voice recognition

## 2023-10-10 ENCOUNTER — Encounter: Payer: Self-pay | Admitting: Bariatrics

## 2023-10-10 ENCOUNTER — Ambulatory Visit (INDEPENDENT_AMBULATORY_CARE_PROVIDER_SITE_OTHER): Admitting: Bariatrics

## 2023-10-10 VITALS — BP 145/78 | HR 79 | Temp 98.1°F | Ht 62.0 in | Wt 196.0 lb

## 2023-10-10 DIAGNOSIS — E669 Obesity, unspecified: Secondary | ICD-10-CM | POA: Diagnosis not present

## 2023-10-10 DIAGNOSIS — E66812 Obesity, class 2: Secondary | ICD-10-CM

## 2023-10-10 DIAGNOSIS — R632 Polyphagia: Secondary | ICD-10-CM

## 2023-10-10 DIAGNOSIS — Z6835 Body mass index (BMI) 35.0-35.9, adult: Secondary | ICD-10-CM

## 2023-10-10 DIAGNOSIS — I10 Essential (primary) hypertension: Secondary | ICD-10-CM | POA: Diagnosis not present

## 2023-10-10 MED ORDER — WEGOVY 1 MG/0.5ML ~~LOC~~ SOAJ: 1.0000 mg | SUBCUTANEOUS | 0 refills | Status: DC

## 2023-10-10 NOTE — Progress Notes (Signed)
 WEIGHT SUMMARY AND BIOMETRICS  Weight Lost Since Last Visit: 5lb  Weight Gained Since Last Visit: 0   Vitals Temp: 98.1 F (36.7 C) BP: (!) 145/78 Pulse Rate: 79 SpO2: 98 %   Anthropometric Measurements Height: 5\' 2"  (1.575 m) Weight: 196 lb (88.9 kg) BMI (Calculated): 35.84 Weight at Last Visit: 201lb Weight Lost Since Last Visit: 5lb Weight Gained Since Last Visit: 0 Starting Weight: 241lb Total Weight Loss (lbs): 45 lb (20.4 kg)   Body Composition  Body Fat %: 45.5 % Fat Mass (lbs): 89.6 lbs Muscle Mass (lbs): 101.8 lbs Total Body Water  (lbs): 73 lbs Visceral Fat Rating : 15   Other Clinical Data Fasting: no Labs: no Today's Visit #: 6 Starting Date: 08/13/18    OBESITY Gabbriel is here to discuss her progress with her obesity treatment plan along with follow-up of her obesity related diagnoses.    Nutrition Plan: the Category 3 plan - 60% adherence.  Current exercise: none  Interim History:  She is down 5 pounds since her last visit. She has been making better choices. She also had some diarrhea but believed due to food.  She states that she believes that the Wegovy  is helping with her appetite. Eating all of the food on the plan., Protein intake is as prescribed, Is not skipping meals, Not journaling consistently., and Water  intake is adequate.   Pharmacotherapy: Menda is on Wegovy  1.0 mg SQ weekly Adverse side effects: None Hunger is moderately controlled.  Cravings are moderately controlled.  Assessment/Plan:    Dakia Woolley endorses excessive hunger.  Medication(s): Wegovy  Effects of medication:  moderately controlled. Cravings are moderately controlled.   Plan: Medication(s): Wegovy  1.0 mg SQ weekly Will increase water , protein and fiber to help assuage hunger.  Will minimize foods that have a high glucose index/load to  minimize reactive hypoglycemia.  Will continue to do her meal planning and will cook more at home.  Hypertension Hypertension control uncertain.  Medication(s): none  BP Readings from Last 3 Encounters:  10/10/23 (!) 145/78  09/12/23 (!) 174/73  08/15/23 (!) 147/83   Lab Results  Component Value Date   CREATININE 0.78 01/15/2023   CREATININE 0.70 11/10/2022   CREATININE 0.73 11/10/2022   Lab Results  Component Value Date   GFR 74.97 01/15/2023   GFR 84.48 02/08/2022   GFR 82.01 07/27/2021    Plan: Continue all antihypertensives at current dosages. No added salt. Will keep sodium content to 1,500 mg or less per day.   She will continue her exercise regimen.  She is considering getting a membership at [employer].     Generalized Obesity: Current BMI BMI (Calculated): 35.84   Pharmacotherapy Plan Continue  Wegovy  1.0 mg SQ weekly  Avangeline is currently in the action stage of change. As such, her goal is to continue with weight loss efforts.  She has agreed to  the Category 3 plan.  Exercise goals: Older adults should follow the adult guidelines. When older adults cannot meet the adult guidelines, they should be as physically active as their abilities and conditions will allow.   Behavioral modification strategies: increasing lean protein intake, meal planning , increase water  intake, better snacking choices, planning for success, decrease snacking , avoiding temptations, and weigh protein portions.  Crisanta has agreed to follow-up with our clinic in 4 weeks.     Objective:   VITALS: Per patient if applicable, see vitals. GENERAL: Alert and in no acute distress. CARDIOPULMONARY: No increased WOB. Speaking in clear sentences.  PSYCH: Pleasant and cooperative. Speech normal rate and rhythm. Affect is appropriate. Insight and judgement are appropriate. Attention is focused, linear, and appropriate.  NEURO: Oriented as arrived to appointment on time with no prompting.    Attestation Statements:  This was prepared with the assistance of Engineer, civil (consulting).  Occasional wrong-word or sound-a-like substitutions may have occurred due to the inherent limitations of voice recognition   Kirk Peper, DO

## 2023-10-15 ENCOUNTER — Encounter: Payer: Self-pay | Admitting: Physician Assistant

## 2023-10-15 ENCOUNTER — Other Ambulatory Visit (INDEPENDENT_AMBULATORY_CARE_PROVIDER_SITE_OTHER): Payer: Self-pay

## 2023-10-15 ENCOUNTER — Ambulatory Visit (INDEPENDENT_AMBULATORY_CARE_PROVIDER_SITE_OTHER): Payer: Medicare Other | Admitting: Physician Assistant

## 2023-10-15 DIAGNOSIS — Z96652 Presence of left artificial knee joint: Secondary | ICD-10-CM

## 2023-10-15 NOTE — Progress Notes (Signed)
 HPI: [name] returns today status post left total knee arthroplasty 10/27/2022.  Patient overall is doing well.  She states that she has little discomfort anterior aspect the left knee when getting in and out of the car but that states is a "nothing".  Notes that her biggest concern is balance she does still have trouble with balance but she is trying to get back to the gym and workout.  Other concerns include urinary incontinence.  Review of systems: See HPI otherwise negative  Physical exam: General Well-developed pleasant female in no acute distress Left knee: Full extension full flexion.  Surgical incisions well-healed.  Tenderness over the pes anserinus area.  No instability valgus varus stressing.  Radiographs: Left knee: 2 views: Status post left total knee arthroplasty well-seated components.  No acute fractures acute findings.  No subluxations dislocations.  Impression: Status post left total knee arthroplasty  Plan: She will continue work on strength gait and balance.  Voltaren gel 2 g up to 4 times daily over the pes anserinus region left knee.  Encouraged her to see a urologist.  Follow-up with us  as needed.

## 2023-10-23 ENCOUNTER — Ambulatory Visit (INDEPENDENT_AMBULATORY_CARE_PROVIDER_SITE_OTHER): Admitting: *Deleted

## 2023-10-23 DIAGNOSIS — D2230 Melanocytic nevi of unspecified part of face: Secondary | ICD-10-CM | POA: Diagnosis not present

## 2023-10-23 DIAGNOSIS — L989 Disorder of the skin and subcutaneous tissue, unspecified: Secondary | ICD-10-CM | POA: Diagnosis not present

## 2023-10-23 DIAGNOSIS — N3942 Incontinence without sensory awareness: Secondary | ICD-10-CM | POA: Diagnosis not present

## 2023-10-23 DIAGNOSIS — Z0000 Encounter for general adult medical examination without abnormal findings: Secondary | ICD-10-CM | POA: Diagnosis not present

## 2023-10-23 NOTE — Progress Notes (Signed)
 Subjective:   Vanessa Christensen is a 73 y.o. female who presents for Medicare Annual (Subsequent) preventive examination.  Visit Complete: Virtual I connected with  [name] on 10/23/23 by a audio enabled telemedicine application and verified that I am speaking with the correct person using two identifiers.  Patient Location: Home  Provider Location: Home Office  I discussed the limitations of evaluation and management by telemedicine. The patient expressed understanding and agreed to proceed.  Vital Signs: Because this visit was a virtual/telehealth visit, some criteria may be missing or patient reported. Any vitals not documented were not able to be obtained and vitals that have been documented are patient reported.    Cardiac Risk Factors include: advanced age (>27men, >26 women)     Objective:     There were no vitals filed for this visit. There is no height or weight on file to calculate BMI.     10/23/2023   10:22 AM 10/23/2023    9:38 AM 05/01/2023   11:52 AM 11/15/2022    4:00 PM 11/10/2022   12:27 PM 10/27/2022    9:16 AM 10/23/2022    9:38 AM  Advanced Directives  Does Patient Have a Medical Advance Directive? Yes Yes Yes Yes No Yes Yes  Type of Sales promotion account executive of [employer] Power of [place];Living will Living will;Healthcare Power of [employer] Power of [place];Living will Healthcare Power of [place];Living will  Does patient want to make changes to medical advance directive?     No - Patient declined No - Patient declined No - Patient declined  Copy of Healthcare Power of Attorney in Chart? No - copy requested Yes - validated most recent copy scanned in chart (See row information)   No - copy requested No - copy requested   Would patient like information on creating a medical advance directive?     No - Patient declined      Current Medications (verified) Outpatient Encounter Medications as of  10/23/2023  Medication Sig   Ascorbic Acid  (VITAMIN C PO) Take 1 tablet by mouth daily.   B Complex-C (SUPER B COMPLEX PO) Take 1 tablet by mouth daily after supper.   buPROPion  (WELLBUTRIN  XL) 150 MG 24 hr tablet TAKE 2 TABLETS BY MOUTH DAILY.   cetirizine (ZYRTEC) 10 MG tablet Take 10 mg by mouth daily.   Cholecalciferol  (VITAMIN D ) 50 MCG (2000 UT) tablet Take 2,000 Units by mouth daily.   clonazePAM  (KLONOPIN ) 0.5 MG tablet Take 1 tablet (0.5 mg total) by mouth 2 (two) times daily as needed for anxiety.   diphenhydramine -acetaminophen  (TYLENOL  PM) 25-500 MG TABS tablet Take 2 tablets by mouth at bedtime as needed (sleep).   dorzolamide -timolol  (COSOPT ) 2-0.5 % ophthalmic solution Place 1 drop into both eyes 2 (two) times daily.   FLUoxetine  (PROZAC ) 40 MG capsule Take 1 capsule (40 mg total) by mouth daily.   ibuprofen (ADVIL) 200 MG tablet Take 400-800 mg by mouth every 6 (six) hours as needed for moderate pain.   levothyroxine  (SYNTHROID ) 75 MCG tablet TAKE 1 TABLET BY MOUTH EVERY DAY   mirabegron  ER (MYRBETRIQ ) 25 MG TB24 tablet Take 1 tablet (25 mg total) by mouth daily.   psyllium (METAMUCIL) 58.6 % powder Take 1 packet by mouth every other day.   rosuvastatin  (CRESTOR ) 10 MG tablet TAKE 1 TABLET BY MOUTH EVERY DAY   Semaglutide -Weight Management (WEGOVY ) 1 MG/0.5ML SOAJ Inject 1 mg into the skin once a week.  traZODone  (DESYREL ) 50 MG tablet Take 0.5-1 tablets (25-50 mg total) by mouth at bedtime as needed. for sleep   No facility-administered encounter medications on file as of 10/23/2023.    Allergies (verified) Neulasta [pegfilgrastim]   History: Past Medical History:  Diagnosis Date   Anemia    hx of  iron deficient   Anxiety    pt denies   Arthritis    hands and feet   Cancer (HCC) 10/04/2006   breast    left   Cataracts, both eyes    Depression    Depression    Diverticulosis of colon    GERD (gastroesophageal reflux disease)    Glaucoma    Hyperlipidemia     Hypertension    no meds   Joint pain    Knee pain    Obesity    OSA on CPAP    Osteoarthritis    Sleep apnea    no CPAP- no longer needed d/t weight loss   Thyroid  activity decreased    Past Surgical History:  Procedure Laterality Date   BREAST SURGERY Bilateral    CATARACT EXTRACTION     COLONOSCOPY     GLAUCOMA REPAIR     MASTECTOMY, RADICAL Bilateral    neulasta induced sterile abscesses     THYROIDECTOMY, PARTIAL     TONSILLECTOMY     TOTAL KNEE ARTHROPLASTY Right    TOTAL KNEE ARTHROPLASTY Left 10/27/2022   Procedure: LEFT TOTAL KNEE ARTHROPLASTY;  Surgeon: Arnie Lao, MD;  Location: [facility];  Service: Orthopedics;  Laterality: Left;   TOTAL KNEE REVISION Right 05/07/2020   Procedure: RIGHT TOTAL KNEE REVISION ARTHROPLASTY;  Surgeon: Arnie Lao, MD;  Location: [facility];  Service: Orthopedics;  Laterality: Right;   Family History  Problem Relation Age of Onset   CVA Mother    Cancer Mother 41       breast cancer    Heart disease Mother    Stroke Mother    Obesity Mother    Colon polyps Father    Leukemia Father    High blood pressure Father    High Cholesterol Father    Heart disease Father    Cancer Maternal Aunt 63       breast cancer    Cancer Maternal Aunt 55       breast cancer   Colon cancer Maternal Aunt    Cancer Cousin 32       breast cancer   Esophageal cancer Neg Hx    Rectal cancer Neg Hx    Stomach cancer Neg Hx    Social History   Socioeconomic History   Marital status: Married    Spouse name: Lorriane Dehart   Number of children: 2   Years of education: Not on file   Highest education level: Not on file  Occupational History   Occupation: Retired    Comment: Runner, broadcasting/film/video  Tobacco Use   Smoking status: Former    Current packs/day: 0.00    Types: Cigarettes    Quit date: 11/02/1977    Years since quitting: 46.0   Smokeless tobacco: Never  Vaping Use   Vaping status: Never Used  Substance and Sexual Activity   Alcohol  use: No   Drug use: No   Sexual activity: Not Currently    Birth control/protection: Post-menopausal  Other Topics Concern   Not on file  Social History Narrative   Enjoys taking care of grandchildren    Social Drivers of Health  Financial Resource Strain: Low Risk  (10/23/2023)   Overall Financial Resource Strain (CARDIA)    Difficulty of Paying Living Expenses: Not hard at all  Food Insecurity: No Food Insecurity (10/23/2023)   Hunger Vital Sign    Worried About Running Out of Food in the Last Year: Never true    Ran Out of Food in the Last Year: Never true  Transportation Needs: No Transportation Needs (10/23/2023)   PRAPARE - Administrator, Civil Service (Medical): No    Lack of Transportation (Non-Medical): No  Physical Activity: Inactive (10/23/2023)   Exercise Vital Sign    Days of Exercise per Week: 0 days    Minutes of Exercise per Session: 0 min  Stress: No Stress Concern Present (10/23/2023)   [employer] of Occupational Health - Occupational Stress Questionnaire    Feeling of Stress : Not at all  Social Connections: Socially Integrated (10/23/2023)   Social Connection and Isolation Panel [NHANES]    Frequency of Communication with Friends and Family: Once a week    Frequency of Social Gatherings with Friends and Family: Three times a week    Attends Religious Services: More than 4 times per year    Active Member of Clubs or Organizations: Yes    Attends Engineer, structural: More than 4 times per year    Marital Status: Married    Tobacco Counseling Counseling given: Not Answered   Clinical Intake:  Pre-visit preparation completed: Yes  Pain : No/denies pain     Diabetes: No  How often do you need to have someone help you when you read instructions, pamphlets, or other written materials from your doctor or pharmacy?: 1 - Never  Interpreter Needed?: No  Information entered by :: Kieth Pelt LPN   Activities of Daily  Living    10/23/2023    9:39 AM 10/27/2022    2:28 PM  In your present state of health, do you have any difficulty performing the following activities:  Hearing? 0   Vision? 0   Difficulty concentrating or making decisions? 0   Walking or climbing stairs? 0   Dressing or bathing? 0   Doing errands, shopping? 0 0  Preparing Food and eating ? N   Using the Toilet? N   In the past six months, have you accidently leaked urine? Y   Do you have problems with loss of bowel control? N   Managing your Medications? N   Managing your Finances? N   Housekeeping or managing your Housekeeping? N     Patient Care Team: Jess Morita, MD as PCP - General (Family Medicine) Alvina Axon, MD as Consulting Physician (Ophthalmology) Sonja [place], MD as Consulting Physician (Hematology) Rib[place] (Dentistry) Burton, Lacie K, NP as Nurse Practitioner (Oncology) Syliva Even, MD as Consulting Physician (Family Medicine) Andrez Banker, MD as Consulting Physician (Urology) Lorayne Rocks, DO as Consulting Physician (Bariatrics) Rolando Cliche, [facility] as Pharmacist (Pharmacist)  Indicate any recent Medical Services you may have received from other than [facility] providers in the past year (date may be approximate).     Assessment:    This is a routine wellness examination for [place].  Hearing/Vision screen Hearing Screening - Comments:: No trouble hearing Vision Screening - Comments:: Up to date  Every 6 months  lyles   Goals Addressed             This Visit's Progress    Patient Stated   Not on track  Increase activity      Patient Stated       Would like to be more steady on feet       Depression Screen    10/23/2023    9:47 AM 07/18/2023    2:11 PM 01/15/2023    8:40 AM 11/16/2022   12:52 PM 10/11/2022   11:43 AM 07/10/2022    9:39 AM 06/12/2022   10:37 AM  PHQ 2/9 Scores  PHQ - 2 Score 3 2 1 2  0 0 2  PHQ- 9 Score 3 6 3 15 3 2 10     Fall Risk    10/23/2023    9:34 AM  07/18/2023    2:11 PM 01/15/2023    8:40 AM 11/16/2022   12:51 PM 10/11/2022   11:41 AM  Fall Risk   Falls in the past year? 1 1 1 1 1   Number falls in past yr: 0 0 0 0 1  Injury with Fall? 0 0 0 1 1  Risk for fall due to :   History of fall(s) History of fall(s) History of fall(s)  Follow up Falls evaluation completed;Education provided;Falls prevention discussed  Falls evaluation completed Falls evaluation completed Falls evaluation completed;Education provided;Falls prevention discussed    MEDICARE RISK AT HOME: Medicare Risk at Home Any stairs in or around the home?: Yes If so, are there any without handrails?: No Home free of loose throw rugs in walkways, pet beds, electrical cords, etc?: Yes Adequate lighting in your home to reduce risk of falls?: Yes Life alert?: No Use of a cane, walker or w/c?: No Grab bars in the bathroom?: Yes Shower chair or bench in shower?: Yes Elevated toilet seat or a handicapped toilet?: No  TIMED UP AND GO:  Was the test performed?  No    Cognitive Function:    05/09/2017   10:29 AM  MMSE - Mini Mental State Exam  Orientation to time 5  Orientation to Place 5  Registration 3  Attention/ Calculation 5  Recall 3  Language- name 2 objects 2  Language- repeat 1  Language- follow 3 step command 3  Language- read & follow direction 1  Write a sentence 1  Copy design 1  Total score 30      03/20/2022   10:36 AM  Montreal Cognitive Assessment   Visuospatial/ Executive (0/5) 4  Naming (0/3) 3  Attention: Read list of digits (0/2) 2  Attention: Read list of letters (0/1) 1  Attention: Serial 7 subtraction starting at 100 (0/3) 3  Language: Repeat phrase (0/2) 2  Language : Fluency (0/1) 1  Abstraction (0/2) 2  Delayed Recall (0/5) 4  Orientation (0/6) 6  Total 28      10/23/2023    9:40 AM 10/11/2022   11:39 AM 07/02/2019    3:19 PM  6CIT Screen  What Year? 0 points 0 points 0 points  What month? 0 points 0 points 0 points  What  time? 0 points 0 points 0 points  Count back from 20 0 points 0 points 0 points  Months in reverse 0 points 0 points 0 points  Repeat phrase 2 points 0 points 0 points  Total Score 2 points 0 points 0 points    Immunizations Immunization History  Administered Date(s) Administered   Influenza, High Dose Seasonal PF 02/02/2019, 04/09/2020, 02/08/2022, 05/09/2023   Influenza,inj,Quad PF,6+ Mos 04/16/2017, 02/01/2018   Influenza-Unspecified 03/05/2016   PFIZER Comirnaty(Gray Top)Covid-19 Tri-Sucrose Vaccine 10/16/2020   PFIZER(Purple Top)SARS-COV-2 Vaccination  07/19/2019, 08/11/2019, 03/08/2020, 04/19/2022   Pfizer(Comirnaty)Fall Seasonal Vaccine 12 years and older 04/19/2022, 05/09/2023   Pneumococcal-Unspecified 03/05/2016   Tdap 12/01/2013   Zoster Recombinant(Shingrix) 09/07/2017, 01/25/2018   Zoster, Live 06/05/2008    TDAP status: Up to date  Flu Vaccine status: Up to date  Pneumococcal vaccine status: Up to date  Covid-19 vaccine status: Completed vaccines  Qualifies for Shingles Vaccine? No   Zostavax completed Yes   Shingrix Completed?: Yes  Screening Tests Health Maintenance  Topic Date Due   MAMMOGRAM  Never done   COVID-19 Vaccine (7 - Pfizer risk 2024-25 season) 11/07/2023   DTaP/Tdap/Td (2 - Td or Tdap) 12/02/2023   INFLUENZA VACCINE  01/04/2024   Medicare Annual Wellness (AWV)  10/22/2024   Colonoscopy  05/06/2033   DEXA SCAN  Completed   Hepatitis C Screening  Completed   Zoster Vaccines- Shingrix  Completed   HPV VACCINES  Aged Out   Meningococcal B Vaccine  Aged Out   Pneumonia Vaccine 23+ Years old  Discontinued    Health Maintenance  Health Maintenance Due  Topic Date Due   MAMMOGRAM  Never done    Colorectal cancer screening: Type of screening: Colonoscopy. Completed 2024. Repeat every 10 years  Mammogram status: Completed  . Repeat every year  Bone Density status: Completed 2020. Results reflect: Bone density results: NORMAL. Repeat  every   years.  Lung Cancer Screening: (Low Dose CT Chest recommended if Age 73-80 years, 20 pack-year currently smoking OR have quit w/in 15years.) does not qualify.   Lung Cancer Screening Referral:   Additional Screening:  Hepatitis C Screening: does not qualify; Completed 2018  Vision Screening: Recommended annual ophthalmology exams for early detection of glaucoma and other disorders of the eye. Is the patient up to date with their annual eye exam?  Yes  Who is the provider or what is the name of the office in which the patient attends annual eye exams? [facility] If pt is not established with a provider, would they like to be referred to a provider to establish care? No .   Dental Screening: Recommended annual dental exams for proper oral hygiene    Community Resource Referral / Chronic Care Management: CRR required this visit?  No   CCM required this visit?  No     Plan:     I have personally reviewed and noted the following in the patient's chart:   Medical and social history Use of alcohol, tobacco or illicit drugs  Current medications and supplements including opioid prescriptions. Patient is not currently taking opioid prescriptions. Functional ability and status Nutritional status Physical activity Advanced directives List of other physicians Hospitalizations, surgeries, and ER visits in previous 12 months Vitals Screenings to include cognitive, depression, and falls Referrals and appointments  In addition, I have reviewed and discussed with patient certain preventive protocols, quality metrics, and best practice recommendations. A written personalized care plan for preventive services as well as general preventive health recommendations were provided to patient.     Kieth Pelt, LPN   1/61/0960   After Visit Summary: (MyChart) Due to this being a telephonic visit, the after visit summary with patients personalized plan was offered to patient via MyChart    Nurse Notes:

## 2023-10-23 NOTE — Addendum Note (Signed)
 Addended by: Shelanda Duvall E on: 10/23/2023 06:53 PM   Modules accepted: Orders

## 2023-10-23 NOTE — Patient Instructions (Signed)
 Vanessa Christensen , Thank you for taking time to come for your Medicare Wellness Visit. I appreciate your ongoing commitment to your health goals. Please review the following plan we discussed and let me know if I can assist you in the future.   Screening recommendations/referrals: Colonoscopy: up to date Mammogram: up to date Bone Density: up to date Recommended yearly ophthalmology/optometry visit for glaucoma screening and checkup Recommended yearly dental visit for hygiene and checkup  Vaccinations: Influenza vaccine: up to date Pneumococcal vaccine: up to date Tdap vaccine: up to date Shingles vaccine: up to date        Preventive Care 75 Years and Older, Female Preventive care refers to lifestyle choices and visits with your health care provider that can promote health and wellness. What does preventive care include? A yearly physical exam. This is also called an annual well check. Dental exams once or twice a year. Routine eye exams. Ask your health care provider how often you should have your eyes checked. Personal lifestyle choices, including: Daily care of your teeth and gums. Regular physical activity. Eating a healthy diet. Avoiding tobacco and drug use. Limiting alcohol use. Practicing safe sex. Taking low-dose aspirin  every day. Taking vitamin and mineral supplements as recommended by your health care provider. What happens during an annual well check? The services and screenings done by your health care provider during your annual well check will depend on your age, overall health, lifestyle risk factors, and family history of disease. Counseling  Your health care provider may ask you questions about your: Alcohol use. Tobacco use. Drug use. Emotional well-being. Home and relationship well-being. Sexual activity. Eating habits. History of falls. Memory and ability to understand (cognition). Work and work Astronomer. Reproductive health. Screening  You may  have the following tests or measurements: Height, weight, and BMI. Blood pressure. Lipid and cholesterol levels. These may be checked every 5 years, or more frequently if you are over 30 years old. Skin check. Lung cancer screening. You may have this screening every year starting at age 18 if you have a 30-pack-year history of smoking and currently smoke or have quit within the past 15 years. Fecal occult blood test (FOBT) of the stool. You may have this test every year starting at age 55. Flexible sigmoidoscopy or colonoscopy. You may have a sigmoidoscopy every 5 years or a colonoscopy every 10 years starting at age 75. Hepatitis C blood test. Hepatitis B blood test. Sexually transmitted disease (STD) testing. Diabetes screening. This is done by checking your blood sugar (glucose) after you have not eaten for a while (fasting). You may have this done every 1-3 years. Bone density scan. This is done to screen for osteoporosis. You may have this done starting at age 52. Mammogram. This may be done every 1-2 years. Talk to your health care provider about how often you should have regular mammograms. Talk with your health care provider about your test results, treatment options, and if necessary, the need for more tests. Vaccines  Your health care provider may recommend certain vaccines, such as: Influenza vaccine. This is recommended every year. Tetanus, diphtheria, and acellular pertussis (Tdap, Td) vaccine. You may need a Td booster every 10 years. Zoster vaccine. You may need this after age 65. Pneumococcal 13-valent conjugate (PCV13) vaccine. One dose is recommended after age 60. Pneumococcal polysaccharide (PPSV23) vaccine. One dose is recommended after age 21. Talk to your health care provider about which screenings and vaccines you need and how often you need  them. This information is not intended to replace advice given to you by your health care provider. Make sure you discuss any  questions you have with your health care provider. Document Released: 06/18/2015 Document Revised: 02/09/2016 Document Reviewed: 03/23/2015 Elsevier Interactive Patient Education  2017 [employer].  Fall Prevention in the Home Falls can cause injuries. They can happen to people of all ages. There are many things you can do to make your home safe and to help prevent falls. What can I do on the outside of my home? Regularly fix the edges of walkways and driveways and fix any cracks. Remove anything that might make you trip as you walk through a door, such as a raised step or threshold. Trim any bushes or trees on the path to your home. Use bright outdoor lighting. Clear any walking paths of anything that might make someone trip, such as rocks or tools. Regularly check to see if handrails are loose or broken. Make sure that both sides of any steps have handrails. Any raised decks and porches should have guardrails on the edges. Have any leaves, snow, or ice cleared regularly. Use sand or salt on walking paths during winter. Clean up any spills in your garage right away. This includes oil or grease spills. What can I do in the bathroom? Use night lights. Install grab bars by the toilet and in the tub and shower. Do not use towel bars as grab bars. Use non-skid mats or decals in the tub or shower. If you need to sit down in the shower, use a plastic, non-slip stool. Keep the floor dry. Clean up any water  that spills on the floor as soon as it happens. Remove soap buildup in the tub or shower regularly. Attach bath mats securely with double-sided non-slip rug tape. Do not have throw rugs and other things on the floor that can make you trip. What can I do in the bedroom? Use night lights. Make sure that you have a light by your bed that is easy to reach. Do not use any sheets or blankets that are too big for your bed. They should not hang down onto the floor. Have a firm chair that has side  arms. You can use this for support while you get dressed. Do not have throw rugs and other things on the floor that can make you trip. What can I do in the kitchen? Clean up any spills right away. Avoid walking on wet floors. Keep items that you use a lot in easy-to-reach places. If you need to reach something above you, use a strong step stool that has a grab bar. Keep electrical cords out of the way. Do not use floor polish or wax that makes floors slippery. If you must use wax, use non-skid floor wax. Do not have throw rugs and other things on the floor that can make you trip. What can I do with my stairs? Do not leave any items on the stairs. Make sure that there are handrails on both sides of the stairs and use them. Fix handrails that are broken or loose. Make sure that handrails are as long as the stairways. Check any carpeting to make sure that it is firmly attached to the stairs. Fix any carpet that is loose or worn. Avoid having throw rugs at the top or bottom of the stairs. If you do have throw rugs, attach them to the floor with carpet tape. Make sure that you have a light switch at  the top of the stairs and the bottom of the stairs. If you do not have them, ask someone to add them for you. What else can I do to help prevent falls? Wear shoes that: Do not have high heels. Have rubber bottoms. Are comfortable and fit you well. Are closed at the toe. Do not wear sandals. If you use a stepladder: Make sure that it is fully opened. Do not climb a closed stepladder. Make sure that both sides of the stepladder are locked into place. Ask someone to hold it for you, if possible. Clearly mark and make sure that you can see: Any grab bars or handrails. First and last steps. Where the edge of each step is. Use tools that help you move around (mobility aids) if they are needed. These include: Canes. Walkers. Scooters. Crutches. Turn on the lights when you go into a dark area.  Replace any light bulbs as soon as they burn out. Set up your furniture so you have a clear path. Avoid moving your furniture around. If any of your floors are uneven, fix them. If there are any pets around you, be aware of where they are. Review your medicines with your doctor. Some medicines can make you feel dizzy. This can increase your chance of falling. Ask your doctor what other things that you can do to help prevent falls. This information is not intended to replace advice given to you by your health care provider. Make sure you discuss any questions you have with your health care provider. Document Released: 03/18/2009 Document Revised: 10/28/2015 Document Reviewed: 06/26/2014 Elsevier Interactive Patient Education  2017 [employer].

## 2023-10-24 NOTE — Progress Notes (Signed)
 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  The amended report date is October 01, 2023.

## 2023-11-04 ENCOUNTER — Other Ambulatory Visit: Payer: Self-pay | Admitting: Bariatrics

## 2023-11-04 DIAGNOSIS — R632 Polyphagia: Secondary | ICD-10-CM

## 2023-11-04 DIAGNOSIS — I10 Essential (primary) hypertension: Secondary | ICD-10-CM

## 2023-11-12 ENCOUNTER — Ambulatory Visit (INDEPENDENT_AMBULATORY_CARE_PROVIDER_SITE_OTHER): Admitting: Physician Assistant

## 2023-11-12 ENCOUNTER — Encounter: Payer: Self-pay | Admitting: Physician Assistant

## 2023-11-12 VITALS — BP 162/71

## 2023-11-12 DIAGNOSIS — L821 Other seborrheic keratosis: Secondary | ICD-10-CM | POA: Diagnosis not present

## 2023-11-12 NOTE — Progress Notes (Signed)
   New Patient Visit   Subjective  Vanessa Christensen is a 73 y.o. female who presents for the following: lesion  Patient has a lesion on scalp. Denies itchiness or bleeding. Noticed it about three months ago and denies changes. No hx of skin cancer.   Declined full skin exam today.    The following portions of the chart were reviewed this encounter and updated as appropriate: medications, allergies, medical history  Review of Systems:  No other skin or systemic complaints except as noted in HPI or Assessment and Plan.  Objective  Well appearing patient in no apparent distress; mood and affect are within normal limits.  A focused examination was performed of the following areas: Scalp-right side  Relevant physical exam findings are noted in the Assessment and Plan.    Assessment & Plan   SEBORRHEIC KERATOSIS - Stuck-on, waxy, tan-brown papule - Benign-appearing - Discussed benign etiology and prognosis. - Observe - Call for any changes    Return if symptoms worsen or fail to improve.  I, Anner Bars, CMA, am acting as scribe for Brynnly Bonet K, PA-C.   Documentation: I have reviewed the above documentation for accuracy and completeness, and I agree with the above.  Quintarius Ferns K, PA-C

## 2023-11-12 NOTE — Patient Instructions (Signed)
 Skin Education :   I counseled the patient regarding the following: Sun screen (SPF 30 or greater) should be applied during peak UV exposure (between 10am and 2pm) and reapplied after exercise or swimming.  The ABCDEs of melanoma were reviewed with the patient, and the importance of monthly self-examination of moles was emphasized. Should any moles change in shape or color, or itch, bleed or burn, pt will contact our office for evaluation sooner then their interval appointment.  Plan: Sunscreen Recommendations I recommended a broad spectrum sunscreen with a SPF of 30 or higher. I explained that SPF 30 sunscreens block approximately 97 percent of the sun's harmful rays. Sunscreens should be applied at least 15 minutes prior to expected sun exposure and then every 2 hours after that as long as sun exposure continues. If swimming or exercising sunscreen should be reapplied every 45 minutes to an hour after getting wet or sweating. One ounce, or the equivalent of a shot glass full of sunscreen, is adequate to protect the skin not covered by a bathing suit. I also recommended a lip balm with a sunscreen as well. Sun protective clothing can be used in lieu of sunscreen but must be worn the entire time you are exposed to the sun's rays.   Due to recent changes in healthcare laws, you may see results of your pathology and/or laboratory studies on MyChart before the doctors have had a chance to review them. We understand that in some cases there may be results that are confusing or concerning to you. Please understand that not all results are received at the same time and often the doctors may need to interpret multiple results in order to provide you with the best plan of care or course of treatment. Therefore, we ask that you please give us  2 business days to thoroughly review all your results before contacting the office for clarification. Should we see a critical lab result, you will be contacted  sooner.   If You Need Anything After Your Visit  If you have any questions or concerns for your doctor, please call our main line at [phone] and press option 4 to reach your doctor's medical assistant. If no one answers, please leave a voicemail as directed and we will return your call as soon as possible. Messages left after 4 pm will be answered the following business day.   You may also send us  a message via MyChart. We typically respond to MyChart messages within 1-2 business days.  For prescription refills, please ask your pharmacy to contact our office. Our fax number is [phone].  If you have an urgent issue when the clinic is closed that cannot wait until the next business day, you can page your doctor at the number below.    Please note that while we do our best to be available for urgent issues outside of office hours, we are not available 24/7.   If you have an urgent issue and are unable to reach us , you may choose to seek medical care at your doctor's office, retail clinic, urgent care center, or emergency room.  If you have a medical emergency, please immediately call 911 or go to the emergency department.   Dermatology Medication Tips: Please keep the boxes that topical medications come in in order to help keep track of the instructions about where and how to use these. Pharmacies typically print the medication instructions only on the boxes and not directly on the medication tubes.   If your  medication is too expensive, please contact our office at [phone] option 4 or send us  a message through MyChart.   We are unable to tell what your co-pay for medications will be in advance as this is different depending on your insurance coverage. However, we may be able to find a substitute medication at lower cost or fill out paperwork to get insurance to cover a needed medication.   If a prior authorization is required to get your medication covered by your insurance  company, please allow us  1-2 business days to complete this process.  Drug prices often vary depending on where the prescription is filled and some pharmacies may offer cheaper prices.  The website www.goodrx.com contains coupons for medications through different pharmacies. The prices here do not account for what the cost may be with help from insurance (it may be cheaper with your insurance), but the website can give you the price if you did not use any insurance.  - You can print the associated coupon and take it with your prescription to the pharmacy.  - You may also stop by our office during regular business hours and pick up a GoodRx coupon card.  - If you need your prescription sent electronically to a different pharmacy, notify our office through [facility] or by phone at [phone] option 4.

## 2023-11-14 ENCOUNTER — Ambulatory Visit (INDEPENDENT_AMBULATORY_CARE_PROVIDER_SITE_OTHER): Admitting: Bariatrics

## 2023-11-14 ENCOUNTER — Encounter: Payer: Self-pay | Admitting: Bariatrics

## 2023-11-14 VITALS — BP 151/85 | HR 87 | Temp 98.1°F | Ht 62.0 in | Wt 195.0 lb

## 2023-11-14 DIAGNOSIS — R632 Polyphagia: Secondary | ICD-10-CM | POA: Diagnosis not present

## 2023-11-14 DIAGNOSIS — E88819 Insulin resistance, unspecified: Secondary | ICD-10-CM | POA: Diagnosis not present

## 2023-11-14 DIAGNOSIS — E669 Obesity, unspecified: Secondary | ICD-10-CM | POA: Diagnosis not present

## 2023-11-14 DIAGNOSIS — Z6835 Body mass index (BMI) 35.0-35.9, adult: Secondary | ICD-10-CM

## 2023-11-14 MED ORDER — WEGOVY 1 MG/0.5ML ~~LOC~~ SOAJ: 1.0000 mg | SUBCUTANEOUS | 0 refills | Status: DC

## 2023-11-14 NOTE — Progress Notes (Signed)
 WEIGHT SUMMARY AND BIOMETRICS  Weight Lost Since Last Visit: 1lb  Weight Gained Since Last Visit: 0   Vitals Temp: 98.1 F (36.7 C) BP: (!) 151/85 Pulse Rate: 87 SpO2: (!) 85 %   Anthropometric Measurements Height: 5' 2 (1.575 m) Weight: 195 lb (88.5 kg) BMI (Calculated): 35.66 Weight at Last Visit: 196lb Weight Lost Since Last Visit: 1lb Weight Gained Since Last Visit: 0 Starting Weight: 241lb Total Weight Loss (lbs): 46 lb (20.9 kg)   Body Composition  Body Fat %: 45.1 % Fat Mass (lbs): 88.2 lbs Muscle Mass (lbs): 101.8 lbs Total Body Water  (lbs): 73.6 lbs Visceral Fat Rating : 15   Other Clinical Data Fasting: no Labs: no Today's Visit #: 34 Starting Date: 08/13/18    OBESITY Vanessa Christensen is here to discuss her progress with her obesity treatment plan along with follow-up of her obesity related diagnoses.    Nutrition Plan: the Category 3 plan - 50% adherence.  Current exercise: none  Interim History:  She is down 1 lbs since her last visit. Her protein is low as she is not eating as much meat. She is not getting enough water .  Not eating all of the food on the plan., Protein intake is as prescribed, Is skipping meals, and Water  intake is adequate.   Pharmacotherapy: Vanessa Christensen is on Wegovy  1.0 mg SQ weekly Adverse side effects: GERD, mild sulfa burps Hunger is moderately controlled.  Cravings are moderately controlled.  Assessment/Plan:   Vanessa Christensen endorses excessive hunger.  Medication(s): Wegovy  Effects of medication:  moderately controlled. Cravings are moderately controlled.   Plan: Medication(s): Wegovy  1.0 mg SQ weekly Will increase water , protein and fiber to help assuage hunger.  Will minimize foods that have a high glucose index/load to minimize reactive hypoglycemia.  She will make good decisions when she eats out. She  will continue to cook at home. Discussed using papaya enzymes if she has sulfa burps   Insulin  Resistance Vanessa Christensen has had elevated fasting insulin  readings. Goal is HgbA1c < 5.7, fasting insulin  at l0 or less, and preferably at 5.  She denies polyphagia. Medication(s): Wegovy  1.0 mg Lab Results  Component Value Date   HGBA1C 5.2 12/08/2020   Lab Results  Component Value Date   INSULIN  4.7 12/08/2020   INSULIN  8.0 01/15/2020   INSULIN  10.9 11/04/2018   INSULIN  13.2 08/13/2018    Plan Medication(s): Wegovy  1.0 mg SQ weekly Will work on the agreed upon plan. Will minimize refined carbohydrates ( sweets and starches), and focus more on complex carbohydrates.  Increase the micronutrients found in leafy greens, which include magnesium , polyphenols, and vitamin C which have been postulated to help with insulin  sensitivity. Minimize fast food and cook more meals at home.  Increase fiber to 25 to 30 grams daily.     Generalized Obesity: Current BMI BMI (Calculated): 35.66   Pharmacotherapy Plan  Continue and refill  Wegovy  1.0 mg SQ weekly  Vanessa Christensen is currently in the action stage of change. As such, her goal is to continue with weight loss efforts.  She has agreed to the Category 3 plan.  Exercise goals: Older adults should determine their level of effort for physical activity relative to their level of fitness.   Behavioral modification strategies: increasing lean protein intake, decreasing simple carbohydrates , no meal skipping, meal planning , keep healthy foods in the home, weigh protein portions, and mindful eating.  Vanessa Christensen has agreed to follow-up with our clinic in 4 weeks.    Objective:   VITALS: Per patient if applicable, see vitals. GENERAL: Alert and in no acute distress. CARDIOPULMONARY: No increased WOB. Speaking in clear sentences.  PSYCH: Pleasant and cooperative. Speech normal rate and rhythm. Affect is appropriate. Insight and judgement are appropriate.  Attention is focused, linear, and appropriate.  NEURO: Oriented as arrived to appointment on time with no prompting.   Attestation Statements:   This was prepared with the assistance of Engineer, civil (consulting).  Occasional wrong-word or sound-a-like substitutions may have occurred due to the inherent limitations of voice recognition    Vanessa Peper, DO

## 2023-11-18 ENCOUNTER — Other Ambulatory Visit: Payer: Self-pay | Admitting: Family Medicine

## 2023-11-18 DIAGNOSIS — E038 Other specified hypothyroidism: Secondary | ICD-10-CM

## 2023-12-12 ENCOUNTER — Ambulatory Visit (INDEPENDENT_AMBULATORY_CARE_PROVIDER_SITE_OTHER): Admitting: Bariatrics

## 2023-12-12 ENCOUNTER — Encounter: Payer: Self-pay | Admitting: Bariatrics

## 2023-12-12 VITALS — BP 155/86 | HR 75 | Temp 98.1°F | Ht 62.0 in | Wt 193.0 lb

## 2023-12-12 DIAGNOSIS — I7090 Unspecified atherosclerosis: Secondary | ICD-10-CM

## 2023-12-12 DIAGNOSIS — R632 Polyphagia: Secondary | ICD-10-CM

## 2023-12-12 DIAGNOSIS — E66812 Obesity, class 2: Secondary | ICD-10-CM

## 2023-12-12 DIAGNOSIS — Z6835 Body mass index (BMI) 35.0-35.9, adult: Secondary | ICD-10-CM

## 2023-12-12 DIAGNOSIS — E669 Obesity, unspecified: Secondary | ICD-10-CM | POA: Diagnosis not present

## 2023-12-12 MED ORDER — WEGOVY 0.5 MG/0.5ML ~~LOC~~ SOAJ
0.5000 mg | SUBCUTANEOUS | 0 refills | Status: DC
Start: 2023-12-12 — End: 2024-01-09

## 2023-12-12 NOTE — Progress Notes (Signed)
 WEIGHT SUMMARY AND BIOMETRICS  Weight Lost Since Last Visit: 2lb  Weight Gained Since Last Visit: 0lb   Vitals Temp: 98.1 F (36.7 C) BP: (!) 155/86 Pulse Rate: 75 SpO2: 99 %   Anthropometric Measurements Height: 5' 2 (1.575 m) Weight: 193 lb (87.5 kg) BMI (Calculated): 35.29 Weight at Last Visit: 195lb Weight Lost Since Last Visit: 2lb Weight Gained Since Last Visit: 0lb Starting Weight: 241lb Total Weight Loss (lbs): 48 lb (21.8 kg)   Body Composition  Body Fat %: 45.4 % Fat Mass (lbs): 88 lbs Muscle Mass (lbs): 100.4 lbs Total Body Water  (lbs): 73.2 lbs Visceral Fat Rating : 15   Other Clinical Data Fasting: no Labs: no Today's Visit #: 80 Starting Date: 08/13/18    OBESITY Vanessa Christensen is here to discuss her progress with her obesity treatment plan along with follow-up of her obesity related diagnoses.    Nutrition Plan: the Category 3 plan - 50% adherence.  Current exercise: Goes to [place] for exercise.  Interim History:  She is down another 2 lbs since her last visit.  She states that she has had more hypoglycemic episodes.  She has not been eating on a regular basis and has been skipping meals.  She has had occasional vomiting.  Not eating all of the food on the plan., Protein intake is less than prescribed., Is skipping meals, Not journaling consistently., Water  intake is adequate., and Denies polyphagia   Pharmacotherapy: Vanessa Christensen is on Vanessa Christensen  1.0 mg SQ weekly Adverse side effects: nausea, and hypoglycemia Hunger is well controlled.  Cravings are well controlled.  Assessment/Plan:   Vanessa Christensen denies excessive hunger.  Medication(s): Vanessa Christensen  Effects of medication (appetite):  well controlled. Cravings are well controlled.   Plan: Medication(s): Decrease Vanessa Christensen  from 1.0 to Vanessa Christensen  0.50 mg SQ weekly Will increase water , protein  and fiber to help assuage hunger.  Will minimize foods that have a high glucose index/load to minimize reactive hypoglycemia.   Atherosclerosis of artery:   She has a history of atherosclerosis of artery noted on imaging.  She is taking Crestor  on a regular basis.   Plan:  She will continue her statin. She will continue the plan at least 85 to 95%. She will stop missing meals.  Discussed hypoglycemia and she will get some glucose pills if needed. Will decrease Vanessa Christensen  from 1 mg to 0.5 mg SQ weekly. She will begin a probiotic hopefully [employer] for women.  Information sheet was given in regard to vetted supplements.    Generalized Obesity: Current BMI BMI (Calculated): 35.29   Pharmacotherapy Plan Discontinue  Vanessa Christensen  0.50 mg SQ weekly  Vanessa Christensen is currently in the action stage of change. As such, her goal is to continue with weight loss efforts.  She has agreed to the Category 3 plan.  Exercise goals: Older adults should determine their level of effort for physical  activity relative to their level of fitness.  She is going to [place] well on a regular basis and is doing strength training and balance along with weights on alternate days.  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  Behavioral modification strategies: increasing lean protein intake, no meal skipping, meal planning , increase water  intake, better snacking choices, planning for success, increasing vegetables, keep healthy foods in the home, weigh protein portions, measure portion sizes, pack lunch for work, and mindful eating.  Vanessa Christensen has agreed to follow-up with our clinic in 4 weeks.      Objective:   VITALS: Per patient if applicable, see vitals. GENERAL: Alert and in no acute distress. CARDIOPULMONARY: No increased WOB. Speaking in clear sentences.  PSYCH: Pleasant and cooperative. Speech normal rate and rhythm. Affect is  appropriate. Insight and judgement are appropriate. Attention is focused, linear, and appropriate.  NEURO: Oriented as arrived to appointment on time with no prompting.   Attestation Statements:   This was prepared with the assistance of Engineer, civil (consulting).  Occasional wrong-word or sound-a-like substitutions may have occurred due to the inherent limitations of voice recognition   Clayborne Daring, DO

## 2023-12-21 ENCOUNTER — Ambulatory Visit (INDEPENDENT_AMBULATORY_CARE_PROVIDER_SITE_OTHER): Admitting: Student in an Organized Health Care Education/Training Program

## 2023-12-21 ENCOUNTER — Encounter: Payer: Self-pay | Admitting: Student in an Organized Health Care Education/Training Program

## 2023-12-21 VITALS — BP 143/104 | HR 81 | Ht 62.0 in | Wt 198.4 lb

## 2023-12-21 DIAGNOSIS — B351 Tinea unguium: Secondary | ICD-10-CM | POA: Diagnosis not present

## 2023-12-21 DIAGNOSIS — L03115 Cellulitis of right lower limb: Secondary | ICD-10-CM | POA: Insufficient documentation

## 2023-12-21 MED ORDER — CEPHALEXIN 500 MG PO CAPS: 500.0000 mg | ORAL_CAPSULE | Freq: Two times a day (BID) | ORAL | 0 refills | Status: AC

## 2023-12-21 NOTE — Assessment & Plan Note (Signed)
 Thickening and discoloration of toenails consistent with onychomycosis.  May have contributed to cellulitis.  I recommended treatment in the future.  Will hold off for now until we complete treatment of the cellulitis.

## 2023-12-21 NOTE — Progress Notes (Signed)
 Acute Office Visit  Subjective:     Patient ID: Vanessa Christensen, female    DOB: [birth year], 73 y.o.   MRN: [id number]  Chief Complaint  Patient presents with   Mass    On right ankle on inside. Started 2 weeks ago. Unknown if she was bitten by something. Red and feels warm.     HPI  Discussed the use of AI scribe software for clinical note transcription with the patient, who gave verbal consent to proceed.  History of Present Illness Vanessa Christensen is a 73 year old female who presents with worsening redness and tenderness on her right anterior lower leg.  She has been experiencing redness and tenderness on her right anterior lower leg, just above the ankle, for the past two weeks. Initially, there was a large, hard lump without redness, which has since become red and tender. The tenderness has spread laterally from the original location. No known insect bites or breaks in the skin are reported. Topical treatments, including Neosporin and a collagen-based product, have been applied without significant improvement. Her sister-in-law, a nurse, assisted in applying a pad and taping the area, but this also did not help.  She mentions that her dog frequently licks her toes and wonders if this could be related to her symptoms. She notes thickening of the nails and discoloration, which she initially thought was due to prolonged use of green toenail polish.  She denies having diabetes and reports generally good health, although she mentions needing to lose more weight. She lost fifty pounds during COVID but has regained some weight since. She is currently on Wegovy  for weight management, having recently adjusted her dose due to side effects. No side effects from antibiotics and no history of diabetes.      Objective:    BP (!) 143/104 (BP Location: Right Arm, Cuff Size: Large)   Pulse 81   Ht 5' 2 (1.575 m)   Wt 198 lb 6.4 oz (90 kg)   BMI 36.29 kg/m    Physical Exam  Gen:  Well-appearing woman Ext: Right leg has a 5 cm area of erythema and induration on the anterior shin just above the ankle.  No purulence.  No skin breakdown or erosions.  It is tender to touch and warm.  No lymphatic streaking.  On her bilateral feet she has mild to moderate onychomycosis with toenail thickening and discoloration.      Assessment & Plan:   Problem List Items Addressed This Visit       Unprioritized   Cellulitis of right leg - Primary   Cellulitis of the right anterior lower leg, above the ankle, characterized by swelling, redness, and tenderness, worsening over two weeks. No evidence of spider bite or skin break. Likely secondary to onychomycosis. Absence of diabetes reduces complication risk. Cefalexin chosen due to no diabetes and no known antibiotic allergies. - Prescribe Cefalexin 500 mg, twice daily for 7 days - Advise against topical treatments on the affected area - Instruct to follow up if no improvement      Relevant Medications   cephALEXin  (KEFLEX ) 500 MG capsule   Onychomycosis   Thickening and discoloration of toenails consistent with onychomycosis.  May have contributed to cellulitis.  I recommended treatment in the future.  Will hold off for now until we complete treatment of the cellulitis.      Relevant Medications   cephALEXin  (KEFLEX ) 500 MG capsule    Meds ordered this encounter  Medications   cephALEXin  (  KEFLEX ) 500 MG capsule    Sig: Take 1 capsule (500 mg total) by mouth 2 (two) times daily for 7 days.    Dispense:  14 capsule    Refill:  0    Return if symptoms worsen or fail to improve.  Cleatus Debby Specking, MD

## 2023-12-21 NOTE — Patient Instructions (Signed)
  VISIT SUMMARY: During your visit, we discussed the redness and tenderness on your right lower leg, which has been worsening over the past two weeks. We also reviewed your toenail condition and your ongoing weight management efforts.  YOUR PLAN: -CELLULITIS: Cellulitis is a bacterial skin infection that causes redness, swelling, and tenderness. You have been prescribed Cefalexin 500 mg to take twice daily for 7 days. Please avoid using any topical treatments on the affected area and follow up if there is no improvement.  -ONYCHOMYCOSIS: Onychomycosis is a fungal infection of the toenails, causing thickening and discoloration. This may have contributed to your cellulitis. We will discuss potential future treatment options with a dermatologist or podiatrist.  -WEIGHT MANAGEMENT: You are currently managing your weight with Wegovy , although it has not been fully effective. Continue with your current dose and consider discussing further weight management strategies with your healthcare provider.  -GENERAL HEALTH MAINTENANCE: You are in overall good health with no diabetes. Continue to engage in weight management programs to maintain your health.  INSTRUCTIONS: Please take Cefalexin 500 mg twice daily for 7 days. Avoid using any topical treatments on the affected area. Follow up if there is no improvement. Discuss potential future treatment options for your toenail condition with a dermatologist or podiatrist. Continue with your current dose of Wegovy  and consider further weight management strategies with your healthcare provider.

## 2023-12-21 NOTE — Assessment & Plan Note (Signed)
 Cellulitis of the right anterior lower leg, above the ankle, characterized by swelling, redness, and tenderness, worsening over two weeks. No evidence of spider bite or skin break. Likely secondary to onychomycosis. Absence of diabetes reduces complication risk. Cefalexin chosen due to no diabetes and no known antibiotic allergies. - Prescribe Cefalexin 500 mg, twice daily for 7 days - Advise against topical treatments on the affected area - Instruct to follow up if no improvement

## 2024-01-09 ENCOUNTER — Ambulatory Visit: Admitting: Bariatrics

## 2024-01-09 ENCOUNTER — Encounter: Payer: Self-pay | Admitting: Bariatrics

## 2024-01-09 VITALS — BP 155/84 | HR 77 | Temp 97.8°F | Ht 62.0 in | Wt 194.0 lb

## 2024-01-09 DIAGNOSIS — R632 Polyphagia: Secondary | ICD-10-CM

## 2024-01-09 DIAGNOSIS — E669 Obesity, unspecified: Secondary | ICD-10-CM | POA: Diagnosis not present

## 2024-01-09 DIAGNOSIS — E785 Hyperlipidemia, unspecified: Secondary | ICD-10-CM

## 2024-01-09 DIAGNOSIS — E66812 Obesity, class 2: Secondary | ICD-10-CM

## 2024-01-09 DIAGNOSIS — Z6835 Body mass index (BMI) 35.0-35.9, adult: Secondary | ICD-10-CM

## 2024-01-09 MED ORDER — WEGOVY 0.5 MG/0.5ML ~~LOC~~ SOAJ: 0.5000 mg | SUBCUTANEOUS | 0 refills | Status: DC

## 2024-01-09 NOTE — Progress Notes (Signed)
 WEIGHT SUMMARY AND BIOMETRICS  Weight Lost Since Last Visit: 0  Weight Gained Since Last Visit: 1lb   Vitals Temp: 97.8 F (36.6 C) BP: (!) 155/84 (Can not take BP in left arm) Pulse Rate: 77 SpO2: 98 %   Anthropometric Measurements Height: 5' 2 (1.575 m) Weight: 194 lb (88 kg) BMI (Calculated): 35.47 Weight at Last Visit: 193lb Weight Lost Since Last Visit: 0 Weight Gained Since Last Visit: 1lb Starting Weight: 241lb Total Weight Loss (lbs): 47 lb (21.3 kg)   Body Composition  Body Fat %: 45.2 % Fat Mass (lbs): 88 lbs Muscle Mass (lbs): 101.2 lbs Total Body Water  (lbs): 73 lbs Visceral Fat Rating : 15   Other Clinical Data Fasting: no Labs: no Today's Visit #: 36 Starting Date: 08/13/18    OBESITY Vanessa Christensen is here to discuss her progress with her obesity treatment plan along with follow-up of her obesity related diagnoses.    Nutrition Plan: the Category 3 plan - 40-45% adherence.  Current exercise: none  Interim History:  She is up 1 lb since her last visit. She states that she should have gained 5 lbs. She has been eating out more. She is taking a probiotic.  She has been on vacation on a regular basis.  Eating all of the food on the plan., Protein intake is as prescribed, Is not skipping meals, Not meeting calorie goals., and Water  intake is adequate.   Pharmacotherapy: Vanessa Christensen is on Wegovy  0.5 mg  Adverse side effects: None Hunger is moderately controlled.  Cravings are moderately controlled.  Assessment/Plan:   Vanessa Christensen endorses excessive hunger.  Medication(s): Wegovy   Effects of medication (appetite):  moderately controlled. Cravings are moderately controlled.   Plan: Medication(s): Wegovy  0.50 mg SQ weekly Will increase water , protein and fiber to help assuage hunger.  Will minimize foods that have a high glucose  index/load to minimize reactive hypoglycemia.  She will continue to track and journal her meals.    Hyperlipidemia LDL is not at goal. Medication(s): Crestor  Cardiovascular risk factors: advanced age (older than 69 for men, 68 for women), dyslipidemia, obesity (BMI >= 30 kg/m2), and sedentary lifestyle  Lab Results  Component Value Date   CHOL 180 01/15/2023   HDL 52.60 01/15/2023   LDLCALC 112 (H) 01/15/2023   TRIG 75.0 01/15/2023   CHOLHDL 3 01/15/2023   Lab Results  Component Value Date   ALT 28 01/15/2023   AST 26 01/15/2023   ALKPHOS 60 01/15/2023   BILITOT 0.5 01/15/2023   The 10-year ASCVD risk score (Arnett DK, et al., 2019) is: 29.1%   Values used to calculate the score:     Age: 73 years     Clincally relevant sex: Female     Is Non-Hispanic African American: No     Diabetic: No     Tobacco smoker: No  Systolic Blood Pressure: 155 mmHg     Is BP treated: Yes     HDL Cholesterol: 52.6 mg/dL     Total Cholesterol: 180 mg/dL  Plan:  Continue statin.  Will avoid all trans fats.  Will read labels Will minimize saturated fats except the following: low fat meats in moderation, diary, and limited dark chocolate.  Will take 1/2 of meal home if she eats out.  She will get back on track and will adhere close to the plan.    Generalized Obesity: Current BMI BMI (Calculated): 35.47   Pharmacotherapy Plan Continue and refill  Wegovy  0.50 mg SQ weekly  Vanessa Christensen is currently in the action stage of change. As such, her goal is to continue with weight loss efforts.  She has agreed to the Category 3 plan.  Exercise goals: Older adults should determine their level of effort for physical activity relative to their level of fitness.   Behavioral modification strategies: increasing lean protein intake, meal planning , increase water  intake, better snacking choices, planning for success, keep healthy foods in the home, increase frequency of journaling, and mindful  eating.  Vanessa Christensen has agreed to follow-up with our clinic in 4 weeks.     Objective:   VITALS: Per patient if applicable, see vitals. GENERAL: Alert and in no acute distress. CARDIOPULMONARY: No increased WOB. Speaking in clear sentences.  PSYCH: Pleasant and cooperative. Speech normal rate and rhythm. Affect is appropriate. Insight and judgement are appropriate. Attention is focused, linear, and appropriate.  NEURO: Oriented as arrived to appointment on time with no prompting.   Attestation Statements:   This was prepared with the assistance of Engineer, civil (consulting).  Occasional wrong-word or sound-a-like substitutions may have occurred due to the inherent limitations of voice recognition   Clayborne Daring, DO

## 2024-02-05 ENCOUNTER — Ambulatory Visit (INDEPENDENT_AMBULATORY_CARE_PROVIDER_SITE_OTHER): Admitting: Bariatrics

## 2024-02-05 ENCOUNTER — Encounter: Payer: Self-pay | Admitting: Bariatrics

## 2024-02-05 VITALS — BP 140/78 | HR 81 | Temp 97.6°F | Ht 62.0 in | Wt 195.0 lb

## 2024-02-05 DIAGNOSIS — E66812 Obesity, class 2: Secondary | ICD-10-CM

## 2024-02-05 DIAGNOSIS — I10 Essential (primary) hypertension: Secondary | ICD-10-CM

## 2024-02-05 DIAGNOSIS — R632 Polyphagia: Secondary | ICD-10-CM | POA: Diagnosis not present

## 2024-02-05 DIAGNOSIS — E669 Obesity, unspecified: Secondary | ICD-10-CM | POA: Diagnosis not present

## 2024-02-05 DIAGNOSIS — Z6835 Body mass index (BMI) 35.0-35.9, adult: Secondary | ICD-10-CM

## 2024-02-05 MED ORDER — WEGOVY 0.5 MG/0.5ML ~~LOC~~ SOAJ: 0.5000 mg | SUBCUTANEOUS | 0 refills | Status: DC

## 2024-02-05 NOTE — Progress Notes (Signed)
 WEIGHT SUMMARY AND BIOMETRICS  Weight Lost Since Last Visit: 0  Weight Gained Since Last Visit: 1lb   Vitals Temp: 97.6 F (36.4 C) BP: (!) 140/78 (Can not take BP in left arm) Pulse Rate: 81 SpO2: 98 %   Anthropometric Measurements Height: 5' 2 (1.575 m) Weight: 195 lb (88.5 kg) BMI (Calculated): 35.66 Weight at Last Visit: 194lb Weight Lost Since Last Visit: 0 Weight Gained Since Last Visit: 1lb Starting Weight: 241lb Total Weight Loss (lbs): 46 lb (20.9 kg)   Body Composition  Body Fat %: 44.9 % Fat Mass (lbs): 87.6 lbs Muscle Mass (lbs): 102.2 lbs Total Body Water  (lbs): 71.2 lbs Visceral Fat Rating : 15   Other Clinical Data Fasting: no Labs: no Today's Visit #: 76 Starting Date: 08/13/18    OBESITY Sayge is here to discuss her progress with her obesity treatment plan along with follow-up of her obesity related diagnoses.    Nutrition Plan: the Category 3 plan - 60% adherence.  Current exercise: none  Interim History:  She is up 1 lb since her last visit.  Eating all of the food on the plan., Protein intake is as prescribed, Is not skipping meals, and Water  intake is adequate.   Pharmacotherapy: Michale is on Wegovy  0.50 mg SQ weekly Adverse side effects: None Hunger is moderately controlled.  Cravings are moderately controlled.  Assessment/Plan:   Lailyn Appelbaum endorses excessive hunger.  Medication(s): Wegovy  0.5 mg Sq into the skin.  Effects of medication (appetite):  moderately controlled. Cravings are moderately controlled.   Plan: Medication(s): Wegovy  0.50 mg SQ weekly Will increase water , protein and fiber to help assuage hunger.  Will minimize foods that have a high glucose index/load to minimize reactive hypoglycemia.  She will always have a protein shake if she does not eat her breakfast and will have a protein  shake for supplementation if needed in the morning.   Hypertension Hypertension control uncertain. She has been under a lot of stress.  Medication(s): none and she has never been on any medications for blood pressure.  Her blood pressure has been mildly elevated on several occasions.  She states that her PCP is aware and does not want to put her on any medications at this time.   BP Readings from Last 3 Encounters:  02/05/24 (!) 140/78  01/09/24 (!) 155/84  12/21/23 (!) 143/104   Lab Results  Component Value Date   CREATININE 0.78 01/15/2023   CREATININE 0.70 11/10/2022   CREATININE 0.73 11/10/2022   Lab Results  Component Value Date   GFR 74.97 01/15/2023   GFR 84.48 02/08/2022   GFR 82.01 07/27/2021    Plan: She will check her blood pressures at home. We will continue to monitor here.  No added salt. Will keep sodium content to 1,500 mg or less per day.      Generalized Obesity:  Current BMI BMI (Calculated): 35.66   Pharmacotherapy Plan Continue and refill  Wegovy  0.50 mg SQ weekly  Tamorah is currently in the action stage of change. As such, her goal is to continue with weight loss efforts.  She has agreed to the Category 3 plan.  Exercise goals: Older adults should do exercises that maintain or improve balance if they are at risk of falling.  She is going to [employer] in [place].  She states that she likes her classes but they are not quite as difficult as her previous classes.  She is doing some stretching and using the machines and wants to continue.  Behavioral modification strategies: increasing lean protein intake, no meal skipping, meal planning , increase water  intake, better snacking choices, planning for success, avoiding temptations, keep healthy foods in the home, and mindful eating.  Caris has agreed to follow-up with our clinic in 4 weeks.   Objective:   VITALS: Per patient if applicable, see vitals. GENERAL: Alert and in no acute  distress. CARDIOPULMONARY: No increased WOB. Speaking in clear sentences.  PSYCH: Pleasant and cooperative. Speech normal rate and rhythm. Affect is appropriate. Insight and judgement are appropriate. Attention is focused, linear, and appropriate.  NEURO: Oriented as arrived to appointment on time with no prompting.   Attestation Statements:   This was prepared with the assistance of Engineer, civil (consulting).  Occasional wrong-word or sound-a-like substitutions may have occurred due to the inherent limitations of voice recognition   Clayborne Daring, DO

## 2024-02-15 DIAGNOSIS — R351 Nocturia: Secondary | ICD-10-CM | POA: Diagnosis not present

## 2024-02-15 DIAGNOSIS — N3946 Mixed incontinence: Secondary | ICD-10-CM | POA: Diagnosis not present

## 2024-03-04 DIAGNOSIS — Z23 Encounter for immunization: Secondary | ICD-10-CM | POA: Diagnosis not present

## 2024-03-07 ENCOUNTER — Other Ambulatory Visit: Payer: Self-pay | Admitting: Family Medicine

## 2024-03-07 DIAGNOSIS — E785 Hyperlipidemia, unspecified: Secondary | ICD-10-CM

## 2024-03-10 ENCOUNTER — Ambulatory Visit (INDEPENDENT_AMBULATORY_CARE_PROVIDER_SITE_OTHER): Admitting: Bariatrics

## 2024-03-10 ENCOUNTER — Encounter: Payer: Self-pay | Admitting: Bariatrics

## 2024-03-10 VITALS — BP 144/76 | HR 73 | Temp 98.2°F | Ht 62.0 in | Wt 197.0 lb

## 2024-03-10 DIAGNOSIS — E669 Obesity, unspecified: Secondary | ICD-10-CM

## 2024-03-10 DIAGNOSIS — R632 Polyphagia: Secondary | ICD-10-CM

## 2024-03-10 DIAGNOSIS — E039 Hypothyroidism, unspecified: Secondary | ICD-10-CM | POA: Diagnosis not present

## 2024-03-10 DIAGNOSIS — Z6836 Body mass index (BMI) 36.0-36.9, adult: Secondary | ICD-10-CM | POA: Diagnosis not present

## 2024-03-10 DIAGNOSIS — E66812 Obesity, class 2: Secondary | ICD-10-CM

## 2024-03-10 DIAGNOSIS — E038 Other specified hypothyroidism: Secondary | ICD-10-CM

## 2024-03-10 MED ORDER — WEGOVY 1 MG/0.5ML ~~LOC~~ SOAJ: 1.0000 mg | SUBCUTANEOUS | 0 refills | Status: DC

## 2024-03-10 NOTE — Progress Notes (Signed)
 WEIGHT SUMMARY AND BIOMETRICS  Weight Lost Since Last Visit: 0  Weight Gained Since Last Visit: 2lb   Vitals Temp: 98.2 F (36.8 C) BP: (!) 144/76 (Cant do blood pressure in left arm) Pulse Rate: 73 SpO2: 100 %   Anthropometric Measurements Height: 5' 2 (1.575 m) Weight: 197 lb (89.4 kg) BMI (Calculated): 36.02 Weight at Last Visit: 195lb Weight Lost Since Last Visit: 0 Weight Gained Since Last Visit: 2lb Starting Weight: 241lb Total Weight Loss (lbs): 44 lb (20 kg)   Body Composition  Body Fat %: 45.7 % Fat Mass (lbs): 90 lbs Muscle Mass (lbs): 101.6 lbs Total Body Water  (lbs): 36.6 lbs Visceral Fat Rating : 15   Other Clinical Data Fasting: no Labs: no Today's Visit #: 64 Starting Date: 08/13/18    OBESITY Vanessa Christensen is here to discuss her progress with her obesity treatment plan along with follow-up of her obesity related diagnoses.    Nutrition Plan: the Category 3 plan - 50% adherence.  Current exercise: none  Interim History:  She is up 2 lbs since her last visit. She states that her scale at home is broken. She is cooking more and eating better.  She is going to [employer] [facility]. She had been doing one-on-one  in the past.  Eating all of the food on the plan., Protein intake is as prescribed, Is skipping meals, Not journaling consistently., and Water  intake is adequate.   Pharmacotherapy: Jacquelene is on Wegovy  0.50 mg SQ weekly Adverse side effects: None Hunger is moderately controlled.  Cravings are moderately controlled.  Assessment/Plan:   [name] endorses excessive hunger, but improved.  Medication(s): Wegovy   Effects of medication:  moderately controlled. Cravings are moderately controlled.   Plan: Medication(s): Increase (Wegovy  1.0 mg into the skin weekly).  Will increase water , protein and fiber to help assuage hunger.   Will minimize foods that have a high glucose index/load to minimize reactive hypoglycemia.  She will continue to go to the gym. She will continue to walk.  She will go out to eat less. She will do more cooking.  Handout for protein options.  Hypothyroidism Stable.  Does not report symptoms associated with uncontrolled hypothyroidism. Medication(s): Levothyroxine  75 mcg daily. Lab Results  Component Value Date   TSH 1.12 01/15/2023    Plan: Continue levothyroxine  at current dose. Counseling: The correct way to take levothyroxine  is fasting, with water , separated by at least 30 minutes from breakfast, and separated by more than 4 hours from calcium , iron, multivitamins, acid reflux medications (PPIs).     Generalized Obesity: Current BMI BMI (Calculated): 36.02   Pharmacotherapy Plan Continue and refill  Wegovy  0.50 mg SQ weekly  Vanessa Christensen is currently in the action stage of change. As such, her goal is to continue with weight loss efforts.  She has agreed to the Category 3 plan.  Exercise goals: Older  adults should determine their level of effort for physical activity relative to their level of fitness.   Behavioral modification strategies: increasing lean protein intake, decreasing simple carbohydrates , no meal skipping, meal planning , increase water  intake, better snacking choices, planning for success, avoiding temptations, and keep healthy foods in the home.  Vanessa Christensen has agreed to follow-up with our clinic in 4 weeks.    Objective:   VITALS: Per patient if applicable, see vitals. GENERAL: Alert and in no acute distress. CARDIOPULMONARY: No increased WOB. Speaking in clear sentences.  PSYCH: Pleasant and cooperative. Speech normal rate and rhythm. Affect is appropriate. Insight and judgement are appropriate. Attention is focused, linear, and appropriate.  NEURO: Oriented as arrived to appointment on time with no prompting.   Attestation Statements:   This was prepared  with the assistance of Engineer, civil (consulting).  Occasional wrong-word or sound-a-like substitutions may have occurred due to the inherent limitations of voice recognition   Clayborne Daring, DO

## 2024-03-14 ENCOUNTER — Observation Stay (HOSPITAL_BASED_OUTPATIENT_CLINIC_OR_DEPARTMENT_OTHER)
Admission: EM | Admit: 2024-03-14 | Discharge: 2024-03-15 | Disposition: A | Discharge status: Home / Self Care | Source: Ambulatory Visit | Attending: Internal Medicine | Admitting: Internal Medicine

## 2024-03-14 ENCOUNTER — Ambulatory Visit: Payer: Self-pay

## 2024-03-14 ENCOUNTER — Emergency Department (HOSPITAL_BASED_OUTPATIENT_CLINIC_OR_DEPARTMENT_OTHER)

## 2024-03-14 ENCOUNTER — Other Ambulatory Visit: Payer: Self-pay

## 2024-03-14 ENCOUNTER — Encounter (HOSPITAL_BASED_OUTPATIENT_CLINIC_OR_DEPARTMENT_OTHER): Payer: Self-pay

## 2024-03-14 DIAGNOSIS — E039 Hypothyroidism, unspecified: Secondary | ICD-10-CM | POA: Diagnosis not present

## 2024-03-14 DIAGNOSIS — Z7902 Long term (current) use of antithrombotics/antiplatelets: Secondary | ICD-10-CM | POA: Diagnosis not present

## 2024-03-14 DIAGNOSIS — I10 Essential (primary) hypertension: Secondary | ICD-10-CM | POA: Insufficient documentation

## 2024-03-14 DIAGNOSIS — E854 Organ-limited amyloidosis: Secondary | ICD-10-CM | POA: Diagnosis not present

## 2024-03-14 DIAGNOSIS — I6389 Other cerebral infarction: Principal | ICD-10-CM | POA: Insufficient documentation

## 2024-03-14 DIAGNOSIS — Z79899 Other long term (current) drug therapy: Secondary | ICD-10-CM | POA: Diagnosis not present

## 2024-03-14 DIAGNOSIS — Z853 Personal history of malignant neoplasm of breast: Secondary | ICD-10-CM | POA: Diagnosis not present

## 2024-03-14 DIAGNOSIS — R339 Retention of urine, unspecified: Secondary | ICD-10-CM | POA: Insufficient documentation

## 2024-03-14 DIAGNOSIS — Z7982 Long term (current) use of aspirin: Secondary | ICD-10-CM | POA: Diagnosis not present

## 2024-03-14 DIAGNOSIS — I639 Cerebral infarction, unspecified: Principal | ICD-10-CM | POA: Diagnosis present

## 2024-03-14 DIAGNOSIS — R29818 Other symptoms and signs involving the nervous system: Secondary | ICD-10-CM | POA: Diagnosis not present

## 2024-03-14 DIAGNOSIS — Z87891 Personal history of nicotine dependence: Secondary | ICD-10-CM | POA: Diagnosis not present

## 2024-03-14 DIAGNOSIS — F39 Unspecified mood [affective] disorder: Secondary | ICD-10-CM | POA: Diagnosis not present

## 2024-03-14 DIAGNOSIS — R42 Dizziness and giddiness: Secondary | ICD-10-CM | POA: Diagnosis not present

## 2024-03-14 DIAGNOSIS — I6381 Other cerebral infarction due to occlusion or stenosis of small artery: Secondary | ICD-10-CM | POA: Diagnosis not present

## 2024-03-14 DIAGNOSIS — R9082 White matter disease, unspecified: Secondary | ICD-10-CM | POA: Diagnosis not present

## 2024-03-14 DIAGNOSIS — E785 Hyperlipidemia, unspecified: Secondary | ICD-10-CM | POA: Diagnosis not present

## 2024-03-14 DIAGNOSIS — R001 Bradycardia, unspecified: Secondary | ICD-10-CM | POA: Diagnosis present

## 2024-03-14 DIAGNOSIS — E66812 Obesity, class 2: Secondary | ICD-10-CM | POA: Insufficient documentation

## 2024-03-14 DIAGNOSIS — F32A Depression, unspecified: Secondary | ICD-10-CM | POA: Insufficient documentation

## 2024-03-14 DIAGNOSIS — I219 Acute myocardial infarction, unspecified: Secondary | ICD-10-CM | POA: Diagnosis not present

## 2024-03-14 DIAGNOSIS — Z6836 Body mass index (BMI) 36.0-36.9, adult: Secondary | ICD-10-CM | POA: Insufficient documentation

## 2024-03-14 DIAGNOSIS — G459 Transient cerebral ischemic attack, unspecified: Secondary | ICD-10-CM | POA: Diagnosis not present

## 2024-03-14 DIAGNOSIS — Z96653 Presence of artificial knee joint, bilateral: Secondary | ICD-10-CM | POA: Diagnosis not present

## 2024-03-14 LAB — BASIC METABOLIC PANEL WITH GFR
Anion gap: 12 (ref 5–15)
BUN: 13 mg/dL (ref 8–23)
CO2: 24 mmol/L (ref 22–32)
Calcium: 9.8 mg/dL (ref 8.9–10.3)
Chloride: 104 mmol/L (ref 98–111)
Creatinine, Ser: 0.69 mg/dL (ref 0.44–1.00)
GFR, Estimated: >60 mL/min (ref >60)
Glucose, Bld: 91 mg/dL (ref 70–99)
Potassium: 4 mmol/L (ref 3.5–5.1)
Sodium: 141 mmol/L (ref 135–145)

## 2024-03-14 LAB — CBC WITH DIFFERENTIAL/PLATELET
Abs Immature Granulocytes: 0.02 K/uL (ref 0.00–0.07)
Basophils Absolute: 0 K/uL (ref 0.0–0.1)
Basophils Relative: 0 %
Eosinophils Absolute: 0 K/uL (ref 0.0–0.5)
Eosinophils Relative: 0 %
HCT: 36 % (ref 36.0–46.0)
Hemoglobin: 11.7 g/dL — ABNORMAL LOW (ref 12.0–15.0)
Immature Granulocytes: 1 %
Lymphocytes Relative: 30 %
Lymphs Abs: 1.3 K/uL (ref 0.7–4.0)
MCH: 29.4 pg (ref 26.0–34.0)
MCHC: 32.5 g/dL (ref 30.0–36.0)
MCV: 90.5 fL (ref 80.0–100.0)
Monocytes Absolute: 0.7 K/uL (ref 0.1–1.0)
Monocytes Relative: 17 %
Neutro Abs: 2.3 K/uL (ref 1.7–7.7)
Neutrophils Relative %: 52 %
Platelets: 147 K/uL — ABNORMAL LOW (ref 150–400)
RBC: 3.98 MIL/uL (ref 3.87–5.11)
RDW: 14.4 % (ref 11.5–15.5)
WBC: 4.3 K/uL (ref 4.0–10.5)
nRBC: 0 % (ref 0.0–0.2)

## 2024-03-14 MED ORDER — IOHEXOL 350 MG/ML SOLN
75.0000 mL | Freq: Once | INTRAVENOUS | Status: AC | PRN
  Administered 2024-03-14: 75 mL via INTRAVENOUS

## 2024-03-14 NOTE — ED Notes (Signed)
 Passed swallow screen and MD approved. Pt given crackers and water .

## 2024-03-14 NOTE — Telephone Encounter (Addendum)
 Pt is at ED, appears MRI ordered

## 2024-03-14 NOTE — ED Provider Notes (Signed)
 [place] EMERGENCY DEPARTMENT AT [facility] Provider Note   CSN: [id number] Arrival date & time: 03/14/24  9051     Patient presents with: Dizziness  73 year old Ralene Gasparyan is a 73 y.o. female with past medical history of OAB, glaucoma, urinary incontinence, hypertension, bilateral knee replacements who presents to the emergency department for evaluation of dizziness.  Patient states her dizziness began yesterday and has gotten worse.  She states she feels like both she and the room are spinning.  She denies headaches.  No recent falls.  Patient states she normally does not walk with a walker, although due to her dizziness today she did use a walker to get to the car.   Dizziness      Prior to Admission medications   Medication Sig Start Date End Date Taking? Authorizing Provider  Ascorbic Acid  (VITAMIN C PO) Take 1 tablet by mouth daily.    [provider]  B Complex-C (SUPER B COMPLEX PO) Take 1 tablet by mouth daily after supper.    [provider]  buPROPion  (WELLBUTRIN  XL) 150 MG 24 hr tablet TAKE 2 TABLETS BY MOUTH DAILY. 03/12/23   Tabori, Katherine E, MD  cetirizine (ZYRTEC) 10 MG tablet Take 10 mg by mouth daily.    [provider]  Cholecalciferol  (VITAMIN D ) 50 MCG (2000 UT) tablet Take 2,000 Units by mouth daily.    [provider]  clonazePAM  (KLONOPIN ) 0.5 MG tablet Take 1 tablet (0.5 mg total) by mouth 2 (two) times daily as needed for anxiety. 11/16/22   Tabori, Katherine E, MD  diphenhydramine -acetaminophen  (TYLENOL  PM) 25-500 MG TABS tablet Take 2 tablets by mouth at bedtime as needed (sleep).    [provider]  dorzolamide -timolol  (COSOPT ) 2-0.5 % ophthalmic solution Place 1 drop into both eyes 2 (two) times daily. 03/13/22   [provider]  FLUoxetine  (PROZAC ) 40 MG capsule Take 1 capsule (40 mg total) by mouth daily. 06/12/23   Tabori, Katherine E, MD  ibuprofen (ADVIL) 200 MG tablet Take 400-800 mg  by mouth every 6 (six) hours as needed for moderate pain.    [provider]  levothyroxine  (SYNTHROID ) 75 MCG tablet TAKE 1 TABLET BY MOUTH EVERY DAY 11/19/23   Tabori, Katherine E, MD  mirabegron  ER (MYRBETRIQ ) 25 MG TB24 tablet Take 1 tablet (25 mg total) by mouth daily. 07/18/23   Tabori, Katherine E, MD  psyllium (METAMUCIL) 58.6 % powder Take 1 packet by mouth every other day.    [provider]  rosuvastatin  (CRESTOR ) 10 MG tablet TAKE 1 TABLET BY MOUTH EVERY DAY 03/07/24   Tabori, Katherine E, MD  semaglutide -weight management (WEGOVY ) 1 MG/0.5ML SOAJ SQ injection Inject 1 mg into the skin once a week. 03/10/24   Delores Shields A, DO  traZODone  (DESYREL ) 50 MG tablet Take 0.5-1 tablets (25-50 mg total) by mouth at bedtime as needed. for sleep 06/12/23   Mahlon Comer BRAVO, MD    Allergies: Neulasta [pegfilgrastim]    Review of Systems  Neurological:  Positive for dizziness.    Updated Vital Signs BP (!) 153/107   Pulse 82   Temp 98.2 F (36.8 C) (Oral)   Resp 18   SpO2 97%   Physical Exam Vitals and nursing note reviewed.  Constitutional:      Appearance: Normal appearance.  HENT:     Head: Normocephalic and atraumatic.     Mouth/Throat:     Mouth: Mucous membranes are moist.  Eyes:     General:  No scleral icterus.       Right eye: No discharge.        Left eye: No discharge.     Extraocular Movements: Extraocular movements intact.     Conjunctiva/sclera: Conjunctivae normal.     Pupils: Pupils are equal, round, and reactive to light.     Comments: No nystagmus.  EOMs intact  Cardiovascular:     Rate and Rhythm: Normal rate and regular rhythm.     Pulses: Normal pulses.  Pulmonary:     Effort: Pulmonary effort is normal.     Breath sounds: Normal breath sounds.  Abdominal:     General: There is no distension.     Tenderness: There is no abdominal tenderness.  Musculoskeletal:        General: No deformity.     Cervical back: Normal range of motion.   Skin:    General: Skin is warm and dry.     Capillary Refill: Capillary refill takes less than 2 seconds.  Neurological:     Mental Status: She is alert.     Motor: No weakness.     Comments: Patient speaks in full goal oriented sentences. Cranial nerves 3-12 grossly intact. DTRs normal and symmetric. Equal grip strength bilateral with 5/5 strength against resistance in upper and lower extremities. No sensory or motor deficits appreciated.   Psychiatric:        Mood and Affect: Mood normal.     (all labs ordered are listed, but only abnormal results are displayed) Labs Reviewed  CBC WITH DIFFERENTIAL/PLATELET - Abnormal; Notable for the following components:      Result Value   Hemoglobin 11.7 (*)    Platelets 147 (*)    All other components within normal limits  BASIC METABOLIC PANEL WITH GFR    EKG: None  Radiology: MR BRAIN WO CONTRAST Result Date: 03/14/2024 EXAM: MR Brain without Intravenous Contrast. CLINICAL HISTORY: 73 year old female with syncope/presyncope, dizziness, and spinning sensation, cerebrovascular cause suspected. TECHNIQUE: Magnetic resonance images of the brain without intravenous contrast in multiple planes. CONTRAST: Without. COMPARISON: None provided. FINDINGS: BRAIN: A solitary small area of restricted diffusion is present in the right splenium of the corpus callosum on series 8 image 84, measuring 6 mm. T2 and FLAIR hyperintensity is noted in this region. Confluent and widespread bilateral white matter T2 and FLAIR hyperintensity is present elsewhere. Corpus callosum volume is maintained. There is bilateral deep white matter involvement. Similar moderate T2 and FLAIR heterogeneity is seen throughout the bilateral deep gray nuclei (series 10 image 15). The brainstem is less affected. The cerebellum appears negative. No cortical encephalomalacia is identified. Chronic microhemorrhages are present in the brain, most conspicuous in the deep granuloma and  brainstem; the extent does not rise to the level of amyloid angiopathy. Flow voids are preserved, and the distal left vertebral artery appears to be dominant. No intracranial mass or hemorrhage. No midline shift or extra-axial fluid collection. No cerebellar tonsillar ectopia. VENTRICLES: No hydrocephalus. ORBITS: Postoperative changes to both globes. SINUSES AND MASTOIDS: The sinuses and mastoid air cells are clear. BONES: No acute fracture or focal osseous lesion. IMPRESSION: 1. 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 No acute hemorrhage or mass effect. 2. Underlying signal changes of advanced chronic small vessel disease in the bilateral white matter and deep gray nuclei. Electronically signed by: Helayne Hurst MD 03/14/2024 12:40 PM EDT RP Workstation: [id number]     Procedures   Medications Ordered in the ED  iohexol  (OMNIPAQUE ) 350 MG/ML  injection 75 mL (75 mLs Intravenous Contrast Given 03/14/24 1436)                                   Medical Decision Making Amount and/or Complexity of Data Reviewed Labs: ordered. Radiology: ordered.  Risk Prescription drug management. Decision regarding hospitalization.   This patient presents to the ED for concern of dizziness, this involves an extensive number of treatment options, and is a complaint that carries with it a high risk of complications and morbidity.  Differential diagnosis includes: Orthostatic hypotension, vestibular neuritis, medication induced dizziness, anxiety, stroke, TIA, cardiac arrhythmia  Co morbidities:  hypothyroid, bilateral total knee replacements   Lab Tests:  I Ordered, and personally interpreted labs.  The pertinent results include:    - Hemoglobin: 11.7 (down from 12.5 in 01/2023)  Imaging Studies:  I ordered imaging studies including MRI brain I independently visualized and interpreted imaging which showed   1. 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 No acute hemorrhage or mass  effect.  2.  Underlying signal changes of advanced chronic small vessel disease in the  bilateral white matter and deep gray nuclei.   CTA head and neck: 1. Acute infarct in the splenium of the corpus callosum, as seen on recent MRI,  not resolved by CT. No acute hemorrhage or significant mass effect.  2. No large vessel occlusion or hemodynamically significant stenosis of the  head or neck vessels.    I agree with the radiologist interpretation  Cardiac Monitoring/ECG:  The patient was maintained on a cardiac monitor.  I personally viewed and interpreted the cardiac monitored which showed an underlying rhythm of: Sinus rhythm  Medicines ordered and prescription drug management:  I ordered medication including  Medications  iohexol  (OMNIPAQUE ) 350 MG/ML injection 75 mL (75 mLs Intravenous Contrast Given 03/14/24 1436)   for CT scanner  Test Considered:   none  Critical Interventions:  multiple consultations  Consultations Obtained: -Neurology - Hospitalist  Problem List / ED Course:     ICD-10-CM   1. Cerebrovascular accident (CVA), unspecified mechanism (HCC)  I63.9       MDM: Patient is a 73 year old female who presents to the emergency department for evaluation of dizziness.  Patient states dizziness began yesterday evening around 7:30 PM patient states the dizziness resolved until around 11 PM, when she went to bed.  Patient's dizziness returned upon waking up this morning and has been getting progressively worse.  Last known normal at 11 PM yesterday.  Full neuroexam completed with no evidence of sensory or strength discrepancies.  No nystagmus.  Cranial nerves III through XII intact.  MRI brain with evidence of an acute 6 mm right splenium lacunar infarct.  No evidence of acute hemorrhage or mass effect.  Neurology consulted for evidence of acute stroke.  CTA head and neck ordered per neuro.  The results confirm infarct that was seen on MRI.  Not resolved on CT.  No evidence of  large vessel occlusion or hemodynamically significant stenosis of the head or neck vessels.  CBC shows hemoglobin 11.7, last labs in 2024.  EKG shows normal sinus rhythm, no evidence of atrial fibrillation.  Patient will be admitted to medicine for further management.  Neurology aware.   Dispostion:  After consideration of the diagnostic results and the patients response to treatment, I feel that the patient would benefit from admission to the hospitalist, with neuro consult.  Final diagnoses:  Cerebrovascular accident (CVA), unspecified mechanism [facility])    ED Discharge Orders     None          Torrence Marry RAMAN, PA-C 03/14/24 1507    Levander Houston, MD 03/17/24 1210

## 2024-03-14 NOTE — ED Notes (Signed)
 Patient transported to MRI

## 2024-03-14 NOTE — Telephone Encounter (Signed)
 FYI Only or Action Required?: FYI only for provider.  Patient was last seen in primary care on 03/10/2024 by Delores Shields A, DO.  Called Nurse Triage reporting Dizziness.  Symptoms began yesterday.  Interventions attempted: Rest, hydration, or home remedies.  Symptoms are: rapidly worsening.  Triage Disposition: Go to ED Now (Notify PCP)  Patient/caregiver understands and will follow disposition?: Yes Reason for Disposition  SEVERE dizziness (vertigo) (e.g., unable to walk without assistance)  Answer Assessment - Initial Assessment Questions Pt reports with severe vertigo, believes it started last night and is worse this morning. Previously saw PT for same several years ago and was tx with Epley maneuver. D/t severity of symptoms and age, pt was triaged to ED. Her husband will drive her.   1. DESCRIPTION: Describe your dizziness.     Spinning  2. VERTIGO: Do you feel like either you or the room is spinning or tilting?      Yes  3. LIGHTHEADED: Do you feel lightheaded? (e.g., somewhat faint, woozy, weak upon standing)     Denies, has to use walker d/t ataxia - does not normally require a walker  4. SEVERITY: How bad is it?  Can you walk?     Unable to ambulate without assistance, using walker - does not normally require a waler  5. ONSET:  When did the dizziness begin?     Last night, worse today  6. AGGRAVATING FACTORS: Does anything make it worse? (e.g., standing, change in head position)     Any movement  7. CAUSE: What do you think is causing the dizziness?     Unknown  8. RECURRENT SYMPTOM: Have you had dizziness before? If Yes, ask: When was the last time? What happened that time?     Yes, several years ago  9. OTHER SYMPTOMS: Do you have any other symptoms? (e.g., earache, headache, numbness, tinnitus, vomiting, weakness)     Mild headache and tinnitus  Protocols used: Dizziness - Vertigo-A-AH Copied from CRM [id number]. Topic: Clinical -  Red Word Triage >> Mar 14, 2024  9:00 AM [place] A wrote: [facility] that prompted transfer to Nurse Triage: Patient's husband said wife is having Vertigo episodes with dizziness, unable to walk and nauseas.

## 2024-03-14 NOTE — ED Triage Notes (Signed)
 Patient reports dizziness. It happened yesterday but she went back to bed and it resolved. She woke up with it again today and it has not resolved. She feels like she is spinning and the room is moving. She denies any weakness, numbness, tingling, or vision changes.

## 2024-03-15 ENCOUNTER — Other Ambulatory Visit (HOSPITAL_COMMUNITY): Payer: Self-pay

## 2024-03-15 ENCOUNTER — Observation Stay (HOSPITAL_COMMUNITY)

## 2024-03-15 DIAGNOSIS — I63331 Cerebral infarction due to thrombosis of right posterior cerebral artery: Secondary | ICD-10-CM

## 2024-03-15 DIAGNOSIS — H8110 Benign paroxysmal vertigo, unspecified ear: Secondary | ICD-10-CM | POA: Diagnosis not present

## 2024-03-15 DIAGNOSIS — R29700 NIHSS score 0: Secondary | ICD-10-CM | POA: Diagnosis not present

## 2024-03-15 DIAGNOSIS — I6381 Other cerebral infarction due to occlusion or stenosis of small artery: Secondary | ICD-10-CM | POA: Diagnosis not present

## 2024-03-15 DIAGNOSIS — I6389 Other cerebral infarction: Secondary | ICD-10-CM

## 2024-03-15 DIAGNOSIS — R001 Bradycardia, unspecified: Secondary | ICD-10-CM | POA: Diagnosis present

## 2024-03-15 DIAGNOSIS — I639 Cerebral infarction, unspecified: Secondary | ICD-10-CM | POA: Diagnosis not present

## 2024-03-15 LAB — LIPID PANEL
Cholesterol: 122 mg/dL (ref 0–200)
HDL: 53 mg/dL (ref >40)
LDL Cholesterol: 59 mg/dL (ref 0–99)
Total CHOL/HDL Ratio: 2.3 ratio
Triglycerides: 52 mg/dL (ref <150)
VLDL: 10 mg/dL (ref 0–40)

## 2024-03-15 LAB — BASIC METABOLIC PANEL WITH GFR
Anion gap: 15 (ref 5–15)
BUN: 10 mg/dL (ref 8–23)
CO2: 19 mmol/L — ABNORMAL LOW (ref 22–32)
Calcium: 9.3 mg/dL (ref 8.9–10.3)
Chloride: 105 mmol/L (ref 98–111)
Creatinine, Ser: 0.77 mg/dL (ref 0.44–1.00)
GFR, Estimated: >60 mL/min (ref >60)
Glucose, Bld: 88 mg/dL (ref 70–99)
Potassium: 3.4 mmol/L — ABNORMAL LOW (ref 3.5–5.1)
Sodium: 139 mmol/L (ref 135–145)

## 2024-03-15 LAB — PROTIME-INR
INR: 1.1 (ref 0.8–1.2)
Prothrombin Time: 15.1 s (ref 11.4–15.2)

## 2024-03-15 LAB — ECHOCARDIOGRAM COMPLETE
AR max vel: 1.83 cm2
AV Area VTI: 1.54 cm2
AV Area mean vel: 1.85 cm2
AV Mean grad: 6 mmHg
AV Peak grad: 10 mmHg
Ao pk vel: 1.58 m/s
Area-P 1/2: 3.46 cm2
Height: 62 in
MV VTI: 2.71 cm2
S' Lateral: 3.5 cm
Weight: 3156.99 [oz_av]

## 2024-03-15 LAB — CBC
HCT: 35.6 % — ABNORMAL LOW (ref 36.0–46.0)
Hemoglobin: 11.7 g/dL — ABNORMAL LOW (ref 12.0–15.0)
MCH: 29.6 pg (ref 26.0–34.0)
MCHC: 32.9 g/dL (ref 30.0–36.0)
MCV: 90.1 fL (ref 80.0–100.0)
Platelets: 157 K/uL (ref 150–400)
RBC: 3.95 MIL/uL (ref 3.87–5.11)
RDW: 14.4 % (ref 11.5–15.5)
WBC: 4.3 K/uL (ref 4.0–10.5)
nRBC: 0 % (ref 0.0–0.2)

## 2024-03-15 LAB — HEMOGLOBIN A1C
Hgb A1c MFr Bld: 4.7 % — ABNORMAL LOW (ref 4.8–5.6)
Mean Plasma Glucose: 88.19 mg/dL

## 2024-03-15 MED ORDER — CLOPIDOGREL BISULFATE 300 MG PO TABS
300.0000 mg | ORAL_TABLET | Freq: Once | ORAL | Status: AC
  Administered 2024-03-15: 300 mg via ORAL
  Filled 2024-03-15: qty 1

## 2024-03-15 MED ORDER — ACETAMINOPHEN 650 MG RE SUPP: 650.0000 mg | Freq: Four times a day (QID) | RECTAL | Status: DC | PRN

## 2024-03-15 MED ORDER — BUPROPION HCL ER (XL) 150 MG PO TB24
300.0000 mg | ORAL_TABLET | Freq: Every day | ORAL | Status: DC
  Administered 2024-03-15: 300 mg via ORAL
  Filled 2024-03-15: qty 2

## 2024-03-15 MED ORDER — ASPIRIN 81 MG PO TBEC
81.0000 mg | DELAYED_RELEASE_TABLET | Freq: Every day | ORAL | 12 refills | Status: AC
  Filled 2024-03-15: qty 30, 30d supply, fill #0

## 2024-03-15 MED ORDER — ACETAMINOPHEN 160 MG/5ML PO SOLN: 650.0000 mg | ORAL | Status: DC | PRN

## 2024-03-15 MED ORDER — ENOXAPARIN SODIUM 40 MG/0.4ML IJ SOSY: 40.0000 mg | PREFILLED_SYRINGE | INTRAMUSCULAR | Status: DC

## 2024-03-15 MED ORDER — LOSARTAN POTASSIUM 25 MG PO TABS
25.0000 mg | ORAL_TABLET | Freq: Every day | ORAL | 2 refills | Status: AC
  Filled 2024-03-15: qty 30, 30d supply, fill #0

## 2024-03-15 MED ORDER — ASPIRIN 81 MG PO TBEC
81.0000 mg | DELAYED_RELEASE_TABLET | Freq: Every day | ORAL | Status: DC
  Administered 2024-03-15: 81 mg via ORAL
  Filled 2024-03-15: qty 1

## 2024-03-15 MED ORDER — MIRABEGRON ER 25 MG PO TB24
25.0000 mg | ORAL_TABLET | Freq: Every day | ORAL | Status: DC
Start: 2024-03-15 — End: 2024-03-15

## 2024-03-15 MED ORDER — ROSUVASTATIN CALCIUM 5 MG PO TABS
10.0000 mg | ORAL_TABLET | Freq: Every day | ORAL | Status: DC
  Administered 2024-03-15: 10 mg via ORAL
  Filled 2024-03-15: qty 2

## 2024-03-15 MED ORDER — SODIUM CHLORIDE 0.9 % IV SOLN: INTRAVENOUS | Status: DC

## 2024-03-15 MED ORDER — FLUOXETINE HCL 20 MG PO CAPS
40.0000 mg | ORAL_CAPSULE | Freq: Every day | ORAL | Status: DC
  Administered 2024-03-15: 40 mg via ORAL
  Filled 2024-03-15: qty 2

## 2024-03-15 MED ORDER — OXYCODONE HCL 5 MG PO TABS: 5.0000 mg | ORAL_TABLET | ORAL | Status: DC | PRN

## 2024-03-15 MED ORDER — ACETAMINOPHEN 650 MG RE SUPP: 650.0000 mg | RECTAL | Status: DC | PRN

## 2024-03-15 MED ORDER — ONDANSETRON HCL 4 MG/2ML IJ SOLN: 4.0000 mg | Freq: Four times a day (QID) | INTRAMUSCULAR | Status: DC | PRN

## 2024-03-15 MED ORDER — CLOPIDOGREL BISULFATE 75 MG PO TABS
75.0000 mg | ORAL_TABLET | Freq: Every day | ORAL | 0 refills | Status: DC
  Filled 2024-03-15: qty 21, 21d supply, fill #0

## 2024-03-15 MED ORDER — ACETAMINOPHEN 325 MG PO TABS: 650.0000 mg | ORAL_TABLET | ORAL | Status: DC | PRN

## 2024-03-15 MED ORDER — CLOPIDOGREL BISULFATE 75 MG PO TABS: 75.0000 mg | ORAL_TABLET | Freq: Every day | ORAL | Status: DC

## 2024-03-15 MED ORDER — ASPIRIN 325 MG PO TABS
325.0000 mg | ORAL_TABLET | Freq: Once | ORAL | Status: AC
  Administered 2024-03-15: 325 mg via ORAL
  Filled 2024-03-15: qty 1

## 2024-03-15 MED ORDER — LEVOTHYROXINE SODIUM 75 MCG PO TABS
75.0000 ug | ORAL_TABLET | Freq: Every day | ORAL | Status: DC
  Administered 2024-03-15: 75 ug via ORAL
  Filled 2024-03-15: qty 1

## 2024-03-15 MED ORDER — ACETAMINOPHEN 325 MG PO TABS: 650.0000 mg | ORAL_TABLET | Freq: Four times a day (QID) | ORAL | Status: DC | PRN

## 2024-03-15 MED ORDER — ONDANSETRON HCL 4 MG PO TABS: 4.0000 mg | ORAL_TABLET | Freq: Four times a day (QID) | ORAL | Status: DC | PRN

## 2024-03-15 MED ORDER — STROKE: EARLY STAGES OF RECOVERY BOOK
Freq: Once | Status: DC
Start: 2024-03-16 — End: 2024-03-15

## 2024-03-15 NOTE — Consult Note (Addendum)
 NEUROLOGY CONSULT NOTE   Date of service: March 15, 2024 Patient Name: Vanessa Christensen MRN:  [id number] DOB:  01-12-1951 Chief Complaint: Vertigo Requesting Provider: Dena Charleston, MD  History of Present Illness  Vanessa Christensen is a 73 y.o. female with hx of anxiety, GERD, hyperlipidemia, hypertension, depression, sleep apnea, obesity, hypothyroidism who presents with vertigo.  She went to bed on Thursday night at 11:00 PM and woke up at 7 AM on Friday morning with intense vertigo that was unrelenting whether she would move or stay still.  She tried to sit up at the edge of the bed and felt like she was in a throughout.  She closed her eyes and it felt like it was worse.  She endorses ringing sensation in her ear that is chronic.  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  No prior history of strokes, no family history of strokes, she denies any history of diabetes.  Endorses history of hypertension and new medication.  Endorses history of hyperlipidemia and is on medication.  She does not smoke, does not drink alcohol, does not use any recreational substances.  She denies any history of atrial fibrillation.  She reports being very dizzy with her head spinning when she got to the ED.  She is feeling much better right now.   LKW: 03/13/2024 at 2300 Modified rankin score: 0-Completely asymptomatic and back to baseline post- stroke IV Thrombolysis: Not offered, outside of window and too mild to treat.   EVT: Not offered, no LVO and too much to treat.    NIHSS components Score: Comment  1a Level of Conscious 0[]  1[]  2[]  3[]      1b LOC Questions 0[]  1[]  2[]       1c LOC Commands 0[]  1[]  2[]       2 Best Gaze 0[]  1[]  2[]       3 Visual 0[]  1[]  2[]  3[]      4 Facial Palsy 0[]  1[]  2[]  3[]      5a Motor Arm - left 0[]  1[]  2[]  3[]  4[]  UN[]    5b Motor Arm - Right 0[]  1[]  2[]  3[]  4[]  UN[]    6a Motor Leg -  Left 0[]  1[]  2[]  3[]  4[]  UN[]    6b Motor Leg - Right 0[]  1[]  2[]  3[]  4[]  UN[]    7 Limb Ataxia 0[]  1[]  2[]  UN[]      8 Sensory 0[]  1[]  2[]  UN[]      9 Best Language 0[]  1[]  2[]  3[]      10 Dysarthria 0[]  1[]  2[]  UN[]      11 Extinct. and Inattention 0[]  1[]  2[]       TOTAL: 0      ROS  Comprehensive ROS performed and pertinent positives documented in HPI   Past History   Past Medical History:  Diagnosis Date   Anemia    hx of  iron deficient   Anxiety    pt denies   Arthritis    hands and feet   Cancer (HCC) 10/04/2006   breast    left   Cataracts, both eyes    Depression    Depression    Diverticulosis of colon    GERD (gastroesophageal reflux disease)    Glaucoma    Hyperlipidemia    Hypertension    no meds   Joint pain    Knee pain    Obesity    OSA on CPAP    Osteoarthritis  Sleep apnea    no CPAP- no longer needed d/t weight loss   Thyroid  activity decreased     Past Surgical History:  Procedure Laterality Date   BREAST SURGERY Bilateral    CATARACT EXTRACTION     COLONOSCOPY     GLAUCOMA REPAIR     MASTECTOMY, RADICAL Bilateral    neulasta induced sterile abscesses     THYROIDECTOMY, PARTIAL     TONSILLECTOMY     TOTAL KNEE ARTHROPLASTY Right    TOTAL KNEE ARTHROPLASTY Left 10/27/2022   Procedure: LEFT TOTAL KNEE ARTHROPLASTY;  Surgeon: Vernetta Lonni GRADE, MD;  Location: [facility];  Service: Orthopedics;  Laterality: Left;   TOTAL KNEE REVISION Right 05/07/2020   Procedure: RIGHT TOTAL KNEE REVISION ARTHROPLASTY;  Surgeon: Vernetta Lonni GRADE, MD;  Location: [facility];  Service: Orthopedics;  Laterality: Right;    Family History: Family History  Problem Relation Age of Onset   CVA Mother    Cancer Mother 49       breast cancer    Heart disease Mother    Stroke Mother    Obesity Mother    Colon polyps Father    Leukemia Father    High blood pressure Father    High Cholesterol Father    Heart disease Father    Cancer Maternal Aunt 102        breast cancer    Cancer Maternal Aunt 55       breast cancer   Colon cancer Maternal Aunt    Cancer Cousin 32       breast cancer   Esophageal cancer Neg Hx    Rectal cancer Neg Hx    Stomach cancer Neg Hx     Social History  reports that she quit smoking about 46 years ago. Her smoking use included cigarettes. She has never used smokeless tobacco. She reports that she does not drink alcohol and does not use drugs.  Allergies  Allergen Reactions   Neulasta [Pegfilgrastim] Other (See Comments)    Raised lumps on legs, arms - had to be excised =  Happened every time pt received Neulasta.    Medications   Current Facility-Administered Medications:    [START ON 03/16/2024]  stroke: early stages of recovery book, , Does not apply, Once, Dorrell, Robert, MD   0.9 %  sodium chloride  infusion, , Intravenous, Continuous, Dorrell, Robert, MD   acetaminophen  (TYLENOL ) tablet 650 mg, 650 mg, Oral, Q4H PRN **OR** acetaminophen  (TYLENOL ) 160 MG/5ML solution 650 mg, 650 mg, Per Tube, Q4H PRN **OR** acetaminophen  (TYLENOL ) suppository 650 mg, 650 mg, Rectal, Q4H PRN, Dena Charleston, MD   aspirin  EC tablet 81 mg, 81 mg, Oral, Daily, Dorrell, Robert, MD   aspirin  tablet 325 mg, 325 mg, Oral, Once, Dorrell, Robert, MD   buPROPion  (WELLBUTRIN  XL) 24 hr tablet 300 mg, 300 mg, Oral, Daily, Dorrell, Robert, MD   clopidogrel (PLAVIX) tablet 300 mg, 300 mg, Oral, Once, Dena Charleston, MD   NOREEN ON 03/16/2024] clopidogrel (PLAVIX) tablet 75 mg, 75 mg, Oral, Daily, Dorrell, Robert, MD   enoxaparin (LOVENOX) injection 40 mg, 40 mg, Subcutaneous, Q24H, Dorrell, Robert, MD   FLUoxetine  (PROZAC ) capsule 40 mg, 40 mg, Oral, Daily, Dorrell, Robert, MD   levothyroxine  (SYNTHROID ) tablet 75 mcg, 75 mcg, Oral, Q0600, Dorrell, Robert, MD   mirabegron  ER (MYRBETRIQ ) tablet 25 mg, 25 mg, Oral, Daily, Dorrell, Robert, MD   ondansetron  (ZOFRAN ) tablet 4 mg, 4 mg, Oral, Q6H PRN **OR** ondansetron  (ZOFRAN ) injection 4  mg, 4 mg, Intravenous, Q6H PRN, Dorrell, Robert, MD   oxyCODONE  (Oxy IR/ROXICODONE ) immediate release tablet 5 mg, 5 mg, Oral, Q4H PRN, Dorrell, Robert, MD   rosuvastatin  (CRESTOR ) tablet 10 mg, 10 mg, Oral, Daily, Dorrell, Robert, MD  Vitals   Vitals:   03-Apr-2024 1730 04/03/2024 1748 04/03/24 2000 Apr 03, 2024 2342  BP: (!) 156/85  131/73 (!) 163/76  Pulse: 78  81   Resp: 12  16 16   Temp:  98.3 F (36.8 C) 98 F (36.7 C) 98.4 F (36.9 C)  TempSrc:  Oral Oral Oral  SpO2: 100%  99% 99%  Weight:    89.5 kg  Height:    5' 2 (1.575 m)    Body mass index is 36.09 kg/m.   Physical Exam   General: Laying comfortably in bed; in no acute distress.  HENT: Normal oropharynx and mucosa. Normal external appearance of ears and nose.  Neck: Supple, no pain or tenderness  CV: No JVD. No peripheral edema.  Pulmonary: Symmetric Chest rise. Normal respiratory effort.  Abdomen: Soft to touch, non-tender.   Ext: No cyanosis, edema, or deformity  Skin: No rash. Normal palpation of skin.   Musculoskeletal: Normal digits and nails by inspection. No clubbing.   Neurologic Examination  Mental status/Cognition: Alert, oriented to self, place, month and year, good attention.  Speech/language: Fluent, comprehension intact, object naming intact, repetition intact.  Cranial nerves:   CN II Pupils equal and reactive to light, no VF deficits    CN III,IV,VI EOM intact, no gaze preference or deviation, no nystagmus    CN V normal sensation in V1, V2, and V3 segments bilaterally    CN VII no asymmetry, no nasolabial fold flattening    CN VIII normal hearing to speech    CN IX & X normal palatal elevation, no uvular deviation    CN XI 5/5 head turn and 5/5 shoulder shrug bilaterally    CN XII midline tongue protrusion    Motor:  Muscle bulk: Normal, tone normal, pronator drift none tremor none Mvmt Root Nerve  Muscle Right Left Comments  SA C5/6 Ax Deltoid 5 5   EF C5/6 Mc Biceps 5 5   EE C6/7/8 Rad  Triceps 5 5   WF C6/7 Med FCR     WE C7/8 PIN ECU     F Ab C8/T1 U ADM/FDI 5 5   HF L1/2/3 Fem Illopsoas 5 5   KE L2/3/4 Fem Quad 5 5   DF L4/5 D Peron Tib Ant 5 5   PF S1/2 Tibial Grc/Sol 5 5    Sensation:  Light touch Intact throughout   Pin prick    Temperature    Vibration   Proprioception    Coordination/Complex Motor:  - Finger to Nose intact bilaterally - Heel to shin intact bilaterally - Rapid alternating movement intact bilaterally - Gait: Deferred for patient safety. Labs/Imaging/Neurodiagnostic studies   CBC:  Recent Labs  Lab Apr 03, 2024 1350  WBC 4.3  NEUTROABS 2.3  HGB 11.7*  HCT 36.0  MCV 90.5  PLT 147*   Basic Metabolic Panel:  Lab Results  Component Value Date   NA 141 April 03, 2024   K 4.0 April 03, 2024   CO2 24 04/03/2024   GLUCOSE 91 2024/04/03   BUN 13 04/03/24   CREATININE 0.69 April 03, 2024   CALCIUM  9.8 2024-04-03   GFRNONAA >60 Apr 03, 2024   GFRAA 101 01/15/2020   Lipid Panel:  Lab Results  Component Value Date   LDLCALC 112 (H) 01/15/2023  HgbA1c:  Lab Results  Component Value Date   HGBA1C 5.2 12/08/2020   Urine Drug Screen: No results found for: LABOPIA, COCAINSCRNUR, LABBENZ, AMPHETMU, THCU, LABBARB  Alcohol Level No results found for: ETH INR No results found for: INR APTT No results found for: APTT AED levels: No results found for: PHENYTOIN, ZONISAMIDE, LAMOTRIGINE, LEVETIRACETA  CT Head without contrast(Personally reviewed): CTH was negative for a large hypodensity concerning for a large territory infarct or hyperdensity concerning for an ICH  CT angio Head and Neck with contrast(Personally reviewed): No LVO  MRI Brain(Personally reviewed): Acute 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  ASSESSMENT   Ellysia Char is a 73 y.o. female with hyperlipidemia, hypertension, sleep apnea, obesity who presents with vertigo that was persistent and lasted several hours and is not improving.  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  I suspect etiology of infarct is likely small vessel disease.  RECOMMENDATIONS  - Frequent Neuro checks per stroke unit protocol - Recommend obtaining TTE - Recommend obtaining Lipid panel with LDL - Please start statin if LDL > 70 - Recommend HbA1c to evaluate for diabetes and how well it is controlled. - Antithrombotic -aspirin  81 mg daily along with Plavix 75 mg daily for 21 days, followed by aspirin  81 mg daily alone. - Recommend DVT ppx - SBP goal - permissive hypertension first 24 h < 220/110. Held home meds.  - Recommend Telemetry monitoring for arrythmia - Recommend bedside swallow screen prior to PO intake. - Stroke education booklet - Recommend PT/OT/SLP consult  ______________________________________________________________________  Plan discussed with Dr. Dena with the hospitalist team overnight.  Plan also discussed with patient in detail at the bedside.  I personally spent a total of 75 minutes in the care of the patient today including preparing to see the patient, getting/reviewing separately obtained history, performing a medically appropriate exam/evaluation, counseling and educating, placing orders, referring and communicating with other health care professionals, documenting clinical information in the EHR, communicating results, and coordinating care.   Signed, Vivien Barretto, MD [facility]

## 2024-03-15 NOTE — Care Management Obs Status (Signed)
 MEDICARE OBSERVATION STATUS NOTIFICATION   Patient Details  Name: Vanessa Christensen MRN: [id number] Date of Birth: April 19, 1951   Medicare Observation Status Notification Given:  Yes    Marval Gell, RN 03/15/2024, 11:25 AM

## 2024-03-15 NOTE — Progress Notes (Signed)
 PROGRESS NOTE        PATIENT DETAILS Name: Vanessa Christensen Age: 73 y.o. Sex: female Date of Birth: 1951-01-17 Admit Date: 03/14/2024 Admitting Physician Lamar Dess, MD ERE:Ujanmp, Comer BRAVO, MD  Brief Summary: Patient is a 73 y.o.  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  Significant events: 10/11>> admit to [facility]  Significant studies: 10/10>> MRI brain: 6 mm acute splenium lacunar infarct. 10/10>> CTA head/neck: No LVO or significant stenosis 10/11>> LDL: 59 10/11>> A1c: 4.7 10/11>> echo: Pending  Significant microbiology data: None  Procedures: None  Consults: Neurology  Subjective: Vertigo has essentially resolved-no complaints.  Objective: Vitals: Blood pressure (!) 160/83, pulse 87, temperature 98.5 F (36.9 C), temperature source Oral, resp. rate 11, height 5' 2 (1.575 m), weight 89.5 kg, SpO2 99%.   Exam: Gen Exam:Alert awake-not in any distress HEENT:atraumatic, normocephalic Chest: B/L clear to auscultation anteriorly CVS:S1S2 regular Abdomen:soft non tender, non distended Extremities:no edema Neurology: Non focal Skin: no rash  Pertinent Labs/Radiology:    Latest Ref Rng & Units 03/15/2024    5:00 AM 03/14/2024    1:50 PM 01/15/2023    9:13 AM  CBC  WBC 4.0 - 10.5 K/uL 4.3  4.3  3.1   Hemoglobin 12.0 - 15.0 g/dL 88.2  88.2  87.4   Hematocrit 36.0 - 46.0 % 35.6  36.0  38.8   Platelets 150 - 400 K/uL 157  147  164.0     Lab Results  Component Value Date   NA 139 03/15/2024   K 3.4 (L) 03/15/2024   CL 105 03/15/2024   CO2 19 (L) 03/15/2024      Assessment/Plan: Acute ischemic CVA Likely small vessel disease No focal deficits this morning-vertigo has resolved Discussed with neurology-Dr. Connee Marion x 3 weeks followed by aspirin  Continue current  dosing of statin Await echo-if stable-no further workup-okay to be discharged home  HLD Continue statin  HTN Blood pressure on the higher side-per patient-her primary care practitioner has been watching her blood pressure-it has been on the higher side for the past several months She has now had a stroke-will start on low-dose losartan on discharge.  Hypothyroidism Synthroid   Mood disorder Stable on Prozac /Wellbutrin   Class 2 Obesity: Estimated body mass index is 36.09 kg/m as calculated from the following:   Height as of this encounter: 5' 2 (1.575 m).   Weight as of this encounter: 89.5 kg.   Code status:   Code Status: Full Code   DVT Prophylaxis: enoxaparin (LOVENOX) injection 40 mg Start: 03/15/24 1400 SCD's Start: 03/15/24 0138 SCDs Start: 03/15/24 0132    Family Communication: None at bedside   Disposition Plan: Status is: Observation The patient remains OBS appropriate and will d/c before 2 midnights.   Planned Discharge Destination:Home   Diet: Diet Order             Diet regular Room service appropriate? Yes; Fluid consistency: Thin  Diet effective now                     Antimicrobial agents: Anti-infectives (From admission, onward)    None        MEDICATIONS: Scheduled Meds:  [START ON 03/16/2024]  stroke: early stages of recovery book   Does not apply Once  aspirin  EC  81 mg Oral Daily   buPROPion   300 mg Oral Daily   [START ON 03/16/2024] clopidogrel  75 mg Oral Daily   enoxaparin (LOVENOX) injection  40 mg Subcutaneous Q24H   FLUoxetine   40 mg Oral Daily   levothyroxine   75 mcg Oral Q0600   rosuvastatin   10 mg Oral Daily   Continuous Infusions:  sodium chloride  40 mL/hr at 03/15/24 0402   PRN Meds:.acetaminophen  **OR** acetaminophen  (TYLENOL ) oral liquid 160 mg/5 mL **OR** acetaminophen , ondansetron  **OR** ondansetron  (ZOFRAN ) IV, oxyCODONE    I have personally reviewed following labs and imaging studies  LABORATORY  DATA: CBC: Recent Labs  Lab 03/14/24 1350 03/15/24 0500  WBC 4.3 4.3  NEUTROABS 2.3  --   HGB 11.7* 11.7*  HCT 36.0 35.6*  MCV 90.5 90.1  PLT 147* 157    Basic Metabolic Panel: Recent Labs  Lab 03/14/24 1350 03/15/24 0500  NA 141 139  K 4.0 3.4*  CL 104 105  CO2 24 19*  GLUCOSE 91 88  BUN 13 10  CREATININE 0.69 0.77  CALCIUM  9.8 9.3    GFR: Estimated Creatinine Clearance: 63.2 mL/min (by C-G formula based on SCr of 0.77 mg/dL).  Liver Function Tests: No results for input(s): AST, ALT, ALKPHOS, BILITOT, PROT, ALBUMIN in the last 168 hours. No results for input(s): LIPASE, AMYLASE in the last 168 hours. No results for input(s): AMMONIA in the last 168 hours.  Coagulation Profile: Recent Labs  Lab 03/15/24 0500  INR 1.1    Cardiac Enzymes: No results for input(s): CKTOTAL, CKMB, CKMBINDEX, TROPONINI in the last 168 hours.  BNP (last 3 results) No results for input(s): PROBNP in the last 8760 hours.  Lipid Profile: Recent Labs    03/15/24 0500  CHOL 122  HDL 53  LDLCALC 59  TRIG 52  CHOLHDL 2.3    Thyroid  Function Tests: No results for input(s): TSH, T4TOTAL, FREET4, T3FREE, THYROIDAB in the last 72 hours.  Anemia Panel: No results for input(s): VITAMINB12, FOLATE, FERRITIN, TIBC, IRON, RETICCTPCT in the last 72 hours.  Urine analysis:    Component Value Date/Time   BILIRUBINUR Negative 01/25/2018 1414   PROTEINUR Negative 01/25/2018 1414   UROBILINOGEN 0.2 01/25/2018 1414   NITRITE Negative 01/25/2018 1414   LEUKOCYTESUR Trace (A) 01/25/2018 1414    Sepsis Labs: Lactic Acid, Venous No results found for: LATICACIDVEN  MICROBIOLOGY: No results found for this or any previous visit (from the past 240 hours).  RADIOLOGY STUDIES/RESULTS: CT ANGIO HEAD NECK W WO CM Result Date: 03/14/2024 EXAM: CT HEAD WITHOUT AND CTA HEAD AND NECK WITH AND WITHOUT 03/14/2024 02:44:29 PM TECHNIQUE: CTA of  the head and neck was performed with and without the administration of 75 mL of iohexol  (OMNIPAQUE ) 350 MG/ML injection. Noncontrast CT of the head with reconstructed 2-D images are also provided for review. Multiplanar 2D and/or 3D reformatted images are provided for review. Automated exposure control, iterative reconstruction, and/or weight based adjustment of the mA/kV was utilized to reduce the radiation dose to as low as reasonably achievable. COMPARISON: MRI brain 03/14/2024 CLINICAL HISTORY: Neuro deficit, acute, stroke suspected; Stroke/TIA, determine embolic source. FINDINGS: CT HEAD: BRAIN AND VENTRICLES: The acute infarct seen on recent MRI in the splenium of the corpus callosum is not resolved by CT. No acute intracranial hemorrhage. No significant mass effect. No midline shift. No extra-axial fluid collection. Stable background of moderate-to-severe chronic small vessel disease. No new loss of gray-white differentiation. No hydrocephalus. ORBITS: No acute abnormality. SINUSES  AND MASTOIDS: No acute abnormality. CTA NECK: AORTIC ARCH AND ARCH VESSELS: No dissection or arterial injury. No significant stenosis of the brachiocephalic or subclavian arteries. Prior right hemithyroidectomy with heterogeneous enlargement of the left thyroid  lobe, better evaluated on recent thyroid  ultrasound. CERVICAL CAROTID ARTERIES: No dissection, arterial injury, or hemodynamically significant stenosis by NASCET criteria. CERVICAL VERTEBRAL ARTERIES: Left arm and vertebral artery. No dissection, arterial injury, or significant stenosis. LUNGS AND MEDIASTINUM: Unremarkable. SOFT TISSUES: No acute abnormality. BONES: No acute abnormality. 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 No significant stenosis of the internal carotid arteries. No significant stenosis of the anterior cerebral arteries. No significant stenosis of the middle cerebral arteries. No  aneurysm. POSTERIOR CIRCULATION: Persistent fetal origin of the left PCA with hypoplastic left P1 segment. No significant stenosis of the posterior cerebral arteries. No significant stenosis of the basilar artery. No significant stenosis of the vertebral arteries. No aneurysm. OTHER: No dural venous sinus thrombosis on this non-dedicated study. IMPRESSION: 1. 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 No acute hemorrhage or significant mass effect. 2. No large vessel occlusion or hemodynamically significant stenosis of the head or neck vessels. Electronically signed by: Ryan Chess MD 03/14/2024 03:02 PM EDT RP Workstation: [id number]   MR BRAIN WO CONTRAST Result Date: 03/14/2024 EXAM: MR Brain without Intravenous Contrast. CLINICAL HISTORY: 73 year old female with syncope/presyncope, dizziness, and spinning sensation, cerebrovascular cause suspected. TECHNIQUE: Magnetic resonance images of the brain without intravenous contrast in multiple planes. CONTRAST: Without. COMPARISON: None provided. FINDINGS: BRAIN: A solitary small area of restricted diffusion is present in the right splenium of the corpus callosum on series 8 image 84, measuring 6 mm. T2 and FLAIR hyperintensity is noted in this region. Confluent and widespread bilateral white matter T2 and FLAIR hyperintensity is present elsewhere. Corpus callosum volume is maintained. There is bilateral deep white matter involvement. Similar moderate T2 and FLAIR heterogeneity is seen throughout the bilateral deep gray nuclei (series 10 image 15). The brainstem is less affected. The cerebellum appears negative. No cortical encephalomalacia is identified. Chronic microhemorrhages are present in the brain, most conspicuous in the deep granuloma and brainstem; the extent does not rise to the level of amyloid angiopathy. Flow voids are preserved, and the distal left vertebral artery appears to be dominant. No  intracranial mass or hemorrhage. No midline shift or extra-axial fluid collection. No cerebellar tonsillar ectopia. VENTRICLES: No hydrocephalus. ORBITS: Postoperative changes to both globes. SINUSES AND MASTOIDS: The sinuses and mastoid air cells are clear. BONES: No acute fracture or focal osseous lesion. IMPRESSION: 1. 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 No acute hemorrhage or mass effect. 2. Underlying signal changes of advanced chronic small vessel disease in the bilateral white matter and deep gray nuclei. Electronically signed by: Helayne Hurst MD 03/14/2024 12:40 PM EDT RP Workstation: [id number]     LOS: 1 day   Donalda Applebaum, MD  [facility]    To contact the attending provider between 7A-7P or the covering provider during after hours 7P-7A, please log into the web site [email or web address] and access using universal [place] password for that web site. If you do not have the password, please call the hospital operator.  03/15/2024, 11:39 AM

## 2024-03-15 NOTE — Evaluation (Signed)
 Physical Therapy Brief Evaluation and Discharge Note Patient Details Name: Vanessa Christensen MRN: [id number] DOB: [birth year] Today's Date: 03/15/2024   History of Present Illness  Patient is a 73 y.o.  female presented with vertigo-upon further evaluation-found to have acute ischemic CVA.  history of HTN (not on any antihypertensives-management lifestyle modification) HLD, hypothyroidism, OSA  Clinical Impression  Pt tolerated treatment well today. Pt today was able to ambulate in hallway with RW at supervision level. PTA pt was fully independent. Pt reported some dizziness during mobility however also reports that she has a history of vertigo. Recommend OP Neuro PT upon DC. Pt presents at or near baseline mobility. Pt has no further acute PT needs and will be signing off. Pt is safe to DC home from a PT standpoint. Re consult PT if mobility status changes. Pt would benefit from continued mobility with mobility specialist during acute stay.        PT Assessment All further PT needs can be met in the next venue of care  Assistance Needed at Discharge  PRN    Equipment Recommendations None recommended by PT  Recommendations for Other Services       Precautions/Restrictions Precautions Precautions: Fall Recall of Precautions/Restrictions: Intact Restrictions Weight Bearing Restrictions Per Provider Order: No        Mobility  Bed Mobility   Supine/Sidelying to sit: Independent   General bed mobility comments: Left seated EOB  Transfers Overall transfer level: Needs assistance Equipment used: None Transfers: Sit to/from Stand Sit to Stand: Supervision           General transfer comment: Pt moving slow due to dizziness.    Ambulation/Gait Ambulation/Gait assistance: Supervision, Contact guard assist Gait Distance (Feet): 250 Feet Assistive device: None, Rolling walker (2 wheels) Gait Pattern/deviations: Decreased stride length, Step-through pattern   General Gait  Details: Pt initially ambulated with no AD CGA however opted for RW due to feeling unsteady. Supervision with RW.  Home Activity Instructions    Stairs Stairs: Yes Stairs assistance: Supervision Stair Management: Two rails, Alternating pattern, Step to pattern, Forwards Number of Stairs: 3 General stair comments: no LOB noted.  Modified Rankin (Stroke Patients Only) Modified Rankin (Stroke Patients Only) Pre-Morbid Rankin Score: No symptoms Modified Rankin: No significant disability      Balance Overall balance assessment: Mild deficits observed, not formally tested                        Pertinent Vitals/Pain PT - Brief Vital Signs All Vital Signs Stable: Yes Pain Assessment Pain Assessment: No/denies pain     Home Living Family/patient expects to be discharged to:: Private residence Living Arrangements: Spouse/significant other Available Help at Discharge: Family;Available 24 hours/day Home Environment: Stairs to enter;Stairs in home;Rail - right;Rail - left  Stairs-Number of Steps: 3+10 Home Equipment: Shower seat - built [employer] fundraiser (2 wheels);Cane - quad;Cane - single point;BSC/3in1;Grab bars - toilet;Hand held shower head        Prior Function Level of Independence: Independent      UE/LE Assessment   UE ROM/Strength/Tone/Coordination: WFL    LE ROM/Strength/Tone/Coordination: [facility]      Communication   Communication Communication: No apparent difficulties     Cognition Overall Cognitive Status: Appears within functional limits for tasks assessed/performed       General Comments General comments (skin integrity, edema, etc.): VSS    Exercises     Assessment/Plan    PT Problem List Decreased strength;Decreased range of motion;Decreased  balance;Decreased activity tolerance;Decreased mobility;Decreased coordination;Decreased knowledge of use of DME;Decreased safety awareness;Cardiopulmonary status limiting activity;Decreased  knowledge of precautions       PT Visit Diagnosis Other abnormalities of gait and mobility (R26.89)    No Skilled PT     Co-evaluation                AMPAC 6 Clicks Help needed turning from your back to your side while in a flat bed without using bedrails?: None Help needed moving from lying on your back to sitting on the side of a flat bed without using bedrails?: None Help needed moving to and from a bed to a chair (including a wheelchair)?: None Help needed standing up from a chair using your arms (e.g., wheelchair or bedside chair)?: A Little Help needed to walk in hospital room?: A Little Help needed climbing 3-5 steps with a railing? : A Little 6 Click Score: 21      End of Session Equipment Utilized During Treatment: Gait belt Activity Tolerance: Patient tolerated treatment well Patient left: in bed;with call bell/phone within reach;with family/visitor present Nurse Communication: Mobility status PT Visit Diagnosis: Other abnormalities of gait and mobility (R26.89)     Time: 1030-1055 PT Time Calculation (min) (ACUTE ONLY): 25 min  Charges:   PT Evaluation $PT Eval Moderate Complexity: 1 Mod PT Treatments $Gait Training: 8-22 mins    Eluterio Seymour B, PT, DPT Acute Rehab Services [phone]   Laith Antonelli  03/15/2024, 12:38 PM

## 2024-03-15 NOTE — H&P (Signed)
 History and Physical    Vanessa Christensen FMW:[id number] DOB: 1951/01/20 DOA: 03/14/2024  PCP: Mahlon Comer BRAVO, MD   Chief Complaint: Dizziness  HPI: Vanessa Christensen is a 73 y.o. female with medical history significant of hypertension, hyperlipidemia who presented to the department due to dizziness.  Patient has been dizzy for the last 24 hours which has been progressively worsening.  She is also having worsening spinning.  She presented to the ER for further assessment.  On arrival she was afebrile hemodynamically stable.  Labs were obtained which showed WBC 4.3, hemoglobin 11.7, BMP unrevealing.  Patient underwent MRI brain which showed right splenium infarct.  CTA head and neck showed no large vessel involvement.  Patient was admitted further workup.   Review of Systems: Review of Systems  Constitutional: Negative.   HENT: Negative.    Eyes: Negative.   Respiratory: Negative.    Cardiovascular: Negative.   Gastrointestinal: Negative.   Genitourinary: Negative.   Musculoskeletal: Negative.   Skin: Negative.   Neurological: Negative.   Endo/Heme/Allergies: Negative.   Psychiatric/Behavioral: Negative.       As per HPI otherwise 10 point review of systems negative.   Allergies  Allergen Reactions   Neulasta [Pegfilgrastim] Other (See Comments)    Raised lumps on legs, arms - had to be excised =  Happened every time pt received Neulasta.    Past Medical History:  Diagnosis Date   Anemia    hx of  iron deficient   Anxiety    pt denies   Arthritis    hands and feet   Cancer (HCC) 10/04/2006   breast    left   Cataracts, both eyes    Depression    Depression    Diverticulosis of colon    GERD (gastroesophageal reflux disease)    Glaucoma    Hyperlipidemia    Hypertension    no meds   Joint pain    Knee pain    Obesity    OSA on CPAP    Osteoarthritis    Sleep apnea    no CPAP- no longer needed d/t weight loss   Thyroid  activity decreased     Past Surgical  History:  Procedure Laterality Date   BREAST SURGERY Bilateral    CATARACT EXTRACTION     COLONOSCOPY     GLAUCOMA REPAIR     MASTECTOMY, RADICAL Bilateral    neulasta induced sterile abscesses     THYROIDECTOMY, PARTIAL     TONSILLECTOMY     TOTAL KNEE ARTHROPLASTY Right    TOTAL KNEE ARTHROPLASTY Left 10/27/2022   Procedure: LEFT TOTAL KNEE ARTHROPLASTY;  Surgeon: Vernetta Lonni GRADE, MD;  Location: [facility];  Service: Orthopedics;  Laterality: Left;   TOTAL KNEE REVISION Right 05/07/2020   Procedure: RIGHT TOTAL KNEE REVISION ARTHROPLASTY;  Surgeon: Vernetta Lonni GRADE, MD;  Location: [facility];  Service: Orthopedics;  Laterality: Right;     reports that she quit smoking about 46 years ago. Her smoking use included cigarettes. She has never used smokeless tobacco. She reports that she does not drink alcohol and does not use drugs.  Family History  Problem Relation Age of Onset   CVA Mother    Cancer Mother 39       breast cancer    Heart disease Mother    Stroke Mother    Obesity Mother    Colon polyps Father    Leukemia Father    High blood pressure Father    High Cholesterol Father  Heart disease Father    Cancer Maternal Aunt 55       breast cancer    Cancer Maternal Aunt 55       breast cancer   Colon cancer Maternal Aunt    Cancer Cousin 32       breast cancer   Esophageal cancer Neg Hx    Rectal cancer Neg Hx    Stomach cancer Neg Hx     Prior to Admission medications   Medication Sig Start Date End Date Taking? Authorizing Provider  Ascorbic Acid  (VITAMIN C PO) Take 1 tablet by mouth daily.    [provider]  B Complex-C (SUPER B COMPLEX PO) Take 1 tablet by mouth daily after supper.    [provider]  buPROPion  (WELLBUTRIN  XL) 150 MG 24 hr tablet TAKE 2 TABLETS BY MOUTH DAILY. 03/12/23   Tabori, Katherine E, MD  cetirizine (ZYRTEC) 10 MG tablet Take 10 mg by mouth daily.    [provider]  Cholecalciferol  (VITAMIN D ) 50 MCG  (2000 UT) tablet Take 2,000 Units by mouth daily.    [provider]  clonazePAM  (KLONOPIN ) 0.5 MG tablet Take 1 tablet (0.5 mg total) by mouth 2 (two) times daily as needed for anxiety. 11/16/22   Tabori, Katherine E, MD  diphenhydramine -acetaminophen  (TYLENOL  PM) 25-500 MG TABS tablet Take 2 tablets by mouth at bedtime as needed (sleep).    [provider]  dorzolamide -timolol  (COSOPT ) 2-0.5 % ophthalmic solution Place 1 drop into both eyes 2 (two) times daily. 03/13/22   [provider]  FLUoxetine  (PROZAC ) 40 MG capsule Take 1 capsule (40 mg total) by mouth daily. 06/12/23   Tabori, Katherine E, MD  ibuprofen (ADVIL) 200 MG tablet Take 400-800 mg by mouth every 6 (six) hours as needed for moderate pain.    [provider]  levothyroxine  (SYNTHROID ) 75 MCG tablet TAKE 1 TABLET BY MOUTH EVERY DAY 11/19/23   Tabori, Katherine E, MD  mirabegron  ER (MYRBETRIQ ) 25 MG TB24 tablet Take 1 tablet (25 mg total) by mouth daily. 07/18/23   Tabori, Katherine E, MD  psyllium (METAMUCIL) 58.6 % powder Take 1 packet by mouth every other day.    [provider]  rosuvastatin  (CRESTOR ) 10 MG tablet TAKE 1 TABLET BY MOUTH EVERY DAY 03/07/24   Tabori, Katherine E, MD  semaglutide -weight management (WEGOVY ) 1 MG/0.5ML SOAJ SQ injection Inject 1 mg into the skin once a week. 03/10/24   Delores Shields A, DO  traZODone  (DESYREL ) 50 MG tablet Take 0.5-1 tablets (25-50 mg total) by mouth at bedtime as needed. for sleep 06/12/23   Mahlon Comer BRAVO, MD    Physical Exam: Vitals:   03/14/24 1730 03/14/24 1748 03/14/24 2000 03/14/24 2342  BP: (!) 156/85  131/73 (!) 163/76  Pulse: 78  81   Resp: 12  16 16   Temp:  98.3 F (36.8 C) 98 F (36.7 C)   TempSrc:  Oral Oral   SpO2: 100%  99% 99%  Weight:    89.5 kg  Height:    5' 2 (1.575 m)   Physical Exam Vitals reviewed.  Constitutional:      Appearance: She is normal weight.  HENT:     Head: Normocephalic.     Nose: Nose normal.      Mouth/Throat:     Mouth: Mucous membranes are moist.     Pharynx: Oropharynx is clear.  Cardiovascular:     Rate and Rhythm: Normal rate and regular rhythm.  Pulses: Normal pulses.     Heart sounds: Normal heart sounds.  Pulmonary:     Effort: Pulmonary effort is normal.     Breath sounds: Normal breath sounds.  Abdominal:     General: Abdomen is flat. Bowel sounds are normal.  Musculoskeletal:        General: Normal range of motion.  Skin:    General: Skin is warm.  Neurological:     General: No focal deficit present.     Mental Status: She is alert. Mental status is at baseline.  Psychiatric:        Mood and Affect: Mood normal.        Labs on Admission: I have personally reviewed the patients's labs and imaging studies.  Assessment/Plan Principal Problem:   CVA (cerebral vascular accident) (HCC)  # Vertigo # Splenium infarct # Acute CVA - MRI with acute stroke - Transferred for stroke workup  Plan: PT evaluation TTE OT/speech Loaded with ASA, plavix Will need neurology consult in morning  # Depression-continue Prozac   # Hypothyroidism-continue levothyroxine   # Urinary retention-continue Myrbetriq 's  # Hyperlipidemia-continue rosuvastatin     Admission status: Inpatient Telemetry Cardiac  Certification: The appropriate patient status for this patient is INPATIENT. Inpatient status is judged to be reasonable and necessary in order to provide the required intensity of service to ensure the patient's safety. The patient's presenting symptoms, physical exam findings, and initial radiographic and laboratory data in the context of their chronic comorbidities is felt to place them at high risk for further clinical deterioration. Furthermore, it is not anticipated that the patient will be medically stable for discharge from the hospital within 2 midnights of admission.   * I certify that at the point of admission it is my clinical judgment that the patient  will require inpatient hospital care spanning beyond 2 midnights from the point of admission due to high intensity of service, high risk for further deterioration and high frequency of surveillance required.DEWAINE Lamar Dess MD [facility] If 7PM-7AM, please contact night-coverage [email or web address]  03/15/2024, 1:36 AM

## 2024-03-15 NOTE — Care Management CC44 (Signed)
 Condition Code 44 Documentation Completed  Patient Details  Name: Vanessa Christensen MRN: [id number] Date of Birth: 1951/01/17   Condition Code 44 given:  Yes Patient signature on Condition Code 44 notice:  Yes Documentation of 2 MD's agreement:  Yes Code 44 added to claim:  Yes    Marval Gell, RN 03/15/2024, 11:25 AM

## 2024-03-15 NOTE — Discharge Summary (Incomplete)
 PATIENT DETAILS Name: Vanessa Christensen Age: 73 y.o. Sex: female Date of Birth: 1951/04/28 MRN: [id number]. Admitting Physician: Lamar Dess, MD ERE:Ujanmp, Comer BRAVO, MD  Admit Date: 03/14/2024 Discharge date: 03/16/2024  Recommendations for Outpatient Follow-up:  Follow up with PCP in 1-2 weeks Please obtain CMP/CBC in one week Please ensure follow-up with stroke clinic/neurology.   Admitted From:  Home  Disposition: Home   Discharge Condition: good  CODE STATUS:   Code Status: Prior   Diet recommendation:  Diet Order             Diet - low sodium heart healthy                    Brief Summary: Patient is a 73 y.o.  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   Significant events: 10/11>> admit to [facility]   Significant studies: 10/10>> MRI brain: 6 mm acute splenium lacunar infarct. 10/10>> CTA head/neck: No LVO or significant stenosis 10/11>> LDL: 59 10/11>> A1c: 4.7 10/11>> echo: EF 55-60%   Significant microbiology data: None   Procedures: None   Consults: Neurology  Brief Hospital Course: Acute ischemic CVA Likely small vessel disease No focal deficits this morning-vertigo has resolved Discussed with neurology-Dr. Francie Marion x 3 weeks followed by aspirin  Continue current dosing of statin   HLD Continue statin   HTN Blood pressure on the higher side-per patient-her primary care practitioner has been watching her blood pressure-it has been on the higher side for the past several months She has now had a stroke-will start on low-dose losartan on discharge.   Hypothyroidism Synthroid    Mood disorder Stable on Prozac /Wellbutrin    Class 2 Obesity: Estimated body mass index is 36.09 kg/m as calculated from the following:   Height as of this encounter: 5' 2 (1.575 m).    Weight as of this encounter: 89.5 kg.   Discharge Diagnoses:  Principal Problem:   CVA (cerebral vascular accident) [facility])   Discharge Instructions:  Activity:  As tolerated   Discharge Instructions     Call MD for:  extreme fatigue   Complete by: As directed    Call MD for:  persistant dizziness or light-headedness   Complete by: As directed    Diet - low sodium heart healthy   Complete by: As directed    Discharge instructions   Complete by: As directed    Follow with Primary MD  Mahlon Comer BRAVO, MD in 1-2 weeks  Follow-up with [facility] neurology-their office will call you with a follow-up appointment-if you do not hear from them-please give them a call.  Please get a complete blood count and chemistry panel checked by your Primary MD at your next visit, and again as instructed by your Primary MD.  Get Medicines reviewed and adjusted: Please take all your medications with you for your next visit with your Primary MD  Laboratory/radiological data: Please request your Primary MD to go over all hospital tests and procedure/radiological results at the follow up, please ask your Primary MD to get all Hospital records sent to his/her office.  In some cases, they will be blood work, cultures and biopsy results pending at the time of your discharge. Please request that your primary care M.D. follows up on these results.  Also Note the following: If you experience worsening of your admission symptoms, develop shortness of breath, life threatening emergency, suicidal or homicidal thoughts  you must seek medical attention immediately by calling 911 or calling your MD immediately  if symptoms less severe.  You must read complete instructions/literature along with all the possible adverse reactions/side effects for all the Medicines you take and that have been prescribed to you. Take any new Medicines after you have completely understood and accpet all the possible adverse  reactions/side effects.   Do not drive when taking Pain medications or sleeping medications (Benzodaizepines)  Do not take more than prescribed Pain, Sleep and Anxiety Medications. It is not advisable to combine anxiety,sleep and pain medications without talking with your primary care practitioner  Special Instructions: If you have smoked or chewed Tobacco  in the last 2 yrs please stop smoking, stop any regular Alcohol  and or any Recreational drug use.  Wear Seat belts while driving.  Please note: You were cared for by a hospitalist during your hospital stay. Once you are discharged, your primary care physician will handle any further medical issues. Please note that NO REFILLS for any discharge medications will be authorized once you are discharged, as it is imperative that you return to your primary care physician (or establish a relationship with a primary care physician if you do not have one) for your post hospital discharge needs so that they can reassess your need for medications and monitor your lab values.   Increase activity slowly   Complete by: As directed       Allergies as of 03/15/2024       Reactions   Neulasta [pegfilgrastim] Other (See Comments)   Raised lumps on legs, arms - had to be excised =  Happened every time pt received Neulasta.        Medication List     TAKE these medications    aspirin  EC 81 MG tablet Take 1 tablet (81 mg total) by mouth daily. Swallow whole.   buPROPion  150 MG 24 hr tablet Commonly known as: WELLBUTRIN  XL TAKE 2 TABLETS BY MOUTH DAILY.   cetirizine 10 MG tablet Commonly known as: ZYRTEC Take 10 mg by mouth daily.   clonazePAM  0.5 MG tablet Commonly known as: KLONOPIN  Take 1 tablet (0.5 mg total) by mouth 2 (two) times daily as needed for anxiety.   clopidogrel 75 MG tablet Commonly known as: PLAVIX Take 1 tablet (75 mg total) by mouth daily.   diphenhydramine -acetaminophen  25-500 MG Tabs tablet Commonly known as:  TYLENOL  PM Take 2 tablets by mouth at bedtime as needed (sleep/pain).   dorzolamide -timolol  2-0.5 % ophthalmic solution Commonly known as: COSOPT  Place 1 drop into both eyes 2 (two) times daily.   FLUoxetine  40 MG capsule Commonly known as: PROZAC  Take 1 capsule (40 mg total) by mouth daily.   levothyroxine  75 MCG tablet Commonly known as: SYNTHROID  TAKE 1 TABLET BY MOUTH EVERY DAY   losartan 25 MG tablet Commonly known as: Cozaar Take 1 tablet (25 mg total) by mouth daily.   mirabegron  ER 25 MG Tb24 tablet Commonly known as: Myrbetriq  Take 1 tablet (25 mg total) by mouth daily.   psyllium 58.6 % powder Commonly known as: METAMUCIL Take 1 packet by mouth every other day.   rosuvastatin  10 MG tablet Commonly known as: CRESTOR  TAKE 1 TABLET BY MOUTH EVERY DAY   SUPER B COMPLEX PO Take 1 tablet by mouth daily after supper.   traZODone  50 MG tablet Commonly known as: DESYREL  Take 0.5-1 tablets (25-50 mg total) by mouth at bedtime as needed. for sleep What changed:  how  much to take when to take this   VITAMIN C PO Take 1 tablet by mouth daily.   Vitamin D  50 MCG (2000 UT) tablet Take 2,000 Units by mouth daily.   Wegovy  1 MG/0.5ML Soaj SQ injection Generic drug: semaglutide -weight management Inject 1 mg into the skin once a week.        Follow-up Information     Mahlon Comer BRAVO, MD. Schedule an appointment as soon as possible for a visit in 1 week(s).   Specialty: Family Medicine Contact information: [address] [place] [address] [phone]         [facility] Follow up.   Specialty: Rehabilitation Why: a referral has been placed for you, for faster scheduling please call office Contact information: [address] [place]  [address] [phone]        Gregg Lek, MD. Schedule an appointment as soon as possible for a visit in 1 month(s).   Specialty: Neurology Contact  information: [address] [place] [address] [phone]                Allergies  Allergen Reactions   Neulasta [Pegfilgrastim] Other (See Comments)    Raised lumps on legs, arms - had to be excised =  Happened every time pt received Neulasta.     Other Procedures/Studies: ECHOCARDIOGRAM COMPLETE Result Date: 03/15/2024    ECHOCARDIOGRAM REPORT   Patient Name:   BAILEIGH MODISETTE Date of Exam: 03/15/2024 Medical Rec #:  [id number]     Height:       62.0 in Accession #:    [id number]    Weight:       197.3 lb Date of Birth:  Feb 26, 1951     BSA:          1.901 m Patient Age:    73 years      BP:           130/106 mmHg Patient Gender: F             HR:           86 bpm. Exam Location:  Inpatient Procedure: 2D Echo, Cardiac Doppler and Color Doppler (Both Spectral and Color            Flow Doppler were utilized during procedure). Indications:    Stroke i63.9  History:        Patient has no prior history of Echocardiogram examinations.                 Risk Factors:Hypertension, Dyslipidemia and Sleep Apnea.  Sonographer:    Damien Senior RDCS Referring Phys: LEOMA DONALDA HERO Ellee Wawrzyniak IMPRESSIONS  1. Left ventricular ejection fraction, by estimation, is 55 to 60%. The left ventricle has normal function. The left ventricle has no regional wall motion abnormalities. Left ventricular diastolic parameters are consistent with Grade I diastolic dysfunction (impaired relaxation).  2. Right ventricular systolic function is normal. The right ventricular size is normal. Tricuspid regurgitation signal is inadequate for assessing PA pressure.  3. Left atrial size was mildly dilated.  4. The mitral valve is degenerative. Trivial mitral valve regurgitation. Mild mitral stenosis. The mean mitral valve gradient is 4.0 mmHg with average heart rate of 87 bpm. Moderate mitral annular calcification.  5. The aortic valve has an indeterminant number of cusps. Aortic valve regurgitation is mild. Aortic valve  sclerosis/calcification is present, without any evidence of aortic stenosis.  6. The inferior vena cava  is dilated in size with >50% respiratory variability, suggesting right atrial pressure of 8 mmHg. FINDINGS  Left Ventricle: Left ventricular ejection fraction, by estimation, is 55 to 60%. The left ventricle has normal function. The left ventricle has no regional wall motion abnormalities. The left ventricular internal cavity size was normal in size. There is  no left ventricular hypertrophy. Left ventricular diastolic function could not be evaluated due to mitral annular calcification (moderate or greater). Left ventricular diastolic parameters are consistent with Grade I diastolic dysfunction (impaired relaxation). Right Ventricle: The right ventricular size is normal. No increase in right ventricular wall thickness. Right ventricular systolic function is normal. Tricuspid regurgitation signal is inadequate for assessing PA pressure. Left Atrium: Left atrial size was mildly dilated. Right Atrium: Right atrial size was normal in size. Pericardium: Trivial pericardial effusion is present. Presence of epicardial fat layer. Mitral Valve: The mitral valve is degenerative in appearance. Moderate mitral annular calcification. Trivial mitral valve regurgitation. Mild mitral valve stenosis. MV peak gradient, 9.4 mmHg. The mean mitral valve gradient is 4.0 mmHg with average heart  rate of 87 bpm. Tricuspid Valve: The tricuspid valve is normal in structure. Tricuspid valve regurgitation is trivial. Aortic Valve: The aortic valve has an indeterminant number of cusps. Aortic valve regurgitation is mild. Aortic valve sclerosis/calcification is present, without any evidence of aortic stenosis. Aortic valve mean gradient measures 6.0 mmHg. Aortic valve peak gradient measures 10.0 mmHg. Aortic valve area, by VTI measures 1.54 cm. Pulmonic Valve: The pulmonic valve was grossly normal. Pulmonic valve regurgitation is not  visualized. Aorta: The aortic root and ascending aorta are structurally normal, with no evidence of dilitation. Venous: The inferior vena cava is dilated in size with greater than 50% respiratory variability, suggesting right atrial pressure of 8 mmHg. IAS/Shunts: There is right bowing of the interatrial septum, suggestive of elevated left atrial pressure. No atrial level shunt detected by color flow Doppler.  LEFT VENTRICLE PLAX 2D LVIDd:         4.70 cm   Diastology LVIDs:         3.50 cm   LV e' medial:    7.29 cm/s LV PW:         0.90 cm   LV E/e' medial:  10.0 LV IVS:        1.00 cm   LV e' lateral:   4.68 cm/s LVOT diam:     2.00 cm   LV E/e' lateral: 15.6 LV SV:         55 LV SV Index:   29 LVOT Area:     3.14 cm LV IVRT:       116 msec  RIGHT VENTRICLE RV S prime:     11.50 cm/s TAPSE (M-mode): 2.3 cm LEFT ATRIUM             Index        RIGHT ATRIUM           Index LA diam:        4.00 cm 2.10 cm/m   RA Area:     13.10 cm LA Vol (A2C):   51.3 ml 26.99 ml/m  RA Volume:   28.80 ml  15.15 ml/m LA Vol (A4C):   48.5 ml 25.52 ml/m LA Biplane Vol: 54.3 ml 28.57 ml/m  AORTIC VALVE AV Area (Vmax):    1.83 cm AV Area (Vmean):   1.85 cm AV Area (VTI):     1.54 cm AV Vmax:  158.00 cm/s AV Vmean:          122.000 cm/s AV VTI:            0.359 m AV Peak Grad:      10.0 mmHg AV Mean Grad:      6.0 mmHg LVOT Vmax:         92.20 cm/s LVOT Vmean:        71.700 cm/s LVOT VTI:          0.176 m LVOT/AV VTI ratio: 0.49  AORTA Ao Root diam: 2.70 cm Ao Asc diam:  2.80 cm MITRAL VALVE MV Area (PHT): 3.46 cm     SHUNTS MV Area VTI:   2.71 cm     Systemic VTI:  0.18 m MV Peak grad:  9.4 mmHg     Systemic Diam: 2.00 cm MV Mean grad:  4.0 mmHg MV Vmax:       1.54 m/s MV Vmean:      87.9 cm/s MV Decel Time: 219 msec MV E velocity: 73.10 cm/s MV A velocity: 146.00 cm/s MV E/A ratio:  0.50 Mihai Croitoru MD Electronically signed by Jerel Balding MD Signature Date/Time: 03/15/2024/3:02:31 PM    Final    CT ANGIO  HEAD NECK W WO CM Result Date: 03/14/2024 EXAM: CT HEAD WITHOUT AND CTA HEAD AND NECK WITH AND WITHOUT 03/14/2024 02:44:29 PM TECHNIQUE: CTA of the head and neck was performed with and without the administration of 75 mL of iohexol  (OMNIPAQUE ) 350 MG/ML injection. Noncontrast CT of the head with reconstructed 2-D images are also provided for review. Multiplanar 2D and/or 3D reformatted images are provided for review. Automated exposure control, iterative reconstruction, and/or weight based adjustment of the mA/kV was utilized to reduce the radiation dose to as low as reasonably achievable. COMPARISON: MRI brain 03/14/2024 CLINICAL HISTORY: Neuro deficit, acute, stroke suspected; Stroke/TIA, determine embolic source. FINDINGS: CT HEAD: BRAIN AND VENTRICLES: The acute infarct seen on recent MRI in the splenium of the corpus callosum is not resolved by CT. No acute intracranial hemorrhage. No significant mass effect. No midline shift. No extra-axial fluid collection. Stable background of moderate-to-severe chronic small vessel disease. No new loss of gray-white differentiation. No hydrocephalus. ORBITS: No acute abnormality. SINUSES AND MASTOIDS: No acute abnormality. CTA NECK: AORTIC ARCH AND ARCH VESSELS: No dissection or arterial injury. No significant stenosis of the brachiocephalic or subclavian arteries. Prior right hemithyroidectomy with heterogeneous enlargement of the left thyroid  lobe, better evaluated on recent thyroid  ultrasound. CERVICAL CAROTID ARTERIES: No dissection, arterial injury, or hemodynamically significant stenosis by NASCET criteria. CERVICAL VERTEBRAL ARTERIES: Left arm and vertebral artery. No dissection, arterial injury, or significant stenosis. LUNGS AND MEDIASTINUM: Unremarkable. SOFT TISSUES: No acute abnormality. BONES: No acute abnormality. 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 No significant stenosis of the  internal carotid arteries. No significant stenosis of the anterior cerebral arteries. No significant stenosis of the middle cerebral arteries. No aneurysm. POSTERIOR CIRCULATION: Persistent fetal origin of the left PCA with hypoplastic left P1 segment. No significant stenosis of the posterior cerebral arteries. No significant stenosis of the basilar artery. No significant stenosis of the vertebral arteries. No aneurysm. OTHER: No dural venous sinus thrombosis on this non-dedicated study. IMPRESSION: 1. 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 No acute hemorrhage or significant mass effect. 2. No large vessel occlusion or hemodynamically significant stenosis of the  head or neck vessels. Electronically signed by: Ryan Chess MD 03/14/2024 03:02 PM EDT RP Workstation: [id number]   MR BRAIN WO CONTRAST Result Date: 03/14/2024 EXAM: MR Brain without Intravenous Contrast. CLINICAL HISTORY: 73 year old female with syncope/presyncope, dizziness, and spinning sensation, cerebrovascular cause suspected. TECHNIQUE: Magnetic resonance images of the brain without intravenous contrast in multiple planes. CONTRAST: Without. COMPARISON: None provided. FINDINGS: BRAIN: A solitary small area of restricted diffusion is present in the right splenium of the corpus callosum on series 8 image 84, measuring 6 mm. T2 and FLAIR hyperintensity is noted in this region. Confluent and widespread bilateral white matter T2 and FLAIR hyperintensity is present elsewhere. Corpus callosum volume is maintained. There is bilateral deep white matter involvement. Similar moderate T2 and FLAIR heterogeneity is seen throughout the bilateral deep gray nuclei (series 10 image 15). The brainstem is less affected. The cerebellum appears negative. No cortical encephalomalacia is identified. Chronic microhemorrhages are present in the brain, most conspicuous in the deep granuloma and brainstem; the  extent does not rise to the level of amyloid angiopathy. Flow voids are preserved, and the distal left vertebral artery appears to be dominant. No intracranial mass or hemorrhage. No midline shift or extra-axial fluid collection. No cerebellar tonsillar ectopia. VENTRICLES: No hydrocephalus. ORBITS: Postoperative changes to both globes. SINUSES AND MASTOIDS: The sinuses and mastoid air cells are clear. BONES: No acute fracture or focal osseous lesion. IMPRESSION: 1. 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 No acute hemorrhage or mass effect. 2. Underlying signal changes of advanced chronic small vessel disease in the bilateral white matter and deep gray nuclei. Electronically signed by: Helayne Hurst MD 03/14/2024 12:40 PM EDT RP Workstation: [id number]     TODAY-DAY OF DISCHARGE:  Subjective:   Vanessa Christensen today has no headache,no chest abdominal pain,no new weakness tingling or numbness, feels much better wants to go home today.   Objective:   Blood pressure (!) 130/106, pulse 83, temperature 99.3 F (37.4 C), temperature source Oral, resp. rate 16, height 5' 2 (1.575 m), weight 89.5 kg, SpO2 99%. No intake or output data in the 24 hours ending 03/16/24 1019 Filed Weights   03/14/24 2342  Weight: 89.5 kg    Exam: Awake Alert, Oriented *3, No new F.N deficits, Normal affect [place].AT,PERRAL Supple Neck,No JVD, No cervical lymphadenopathy appriciated.  Symmetrical Chest wall movement, Good air movement bilaterally, CTAB RRR,No Gallops,Rubs or new Murmurs, No Parasternal Heave +ve B.Sounds, Abd Soft, Non tender, No organomegaly appriciated, No rebound -guarding or rigidity. No Cyanosis, Clubbing or edema, No new Rash or bruise   PERTINENT RADIOLOGIC STUDIES: ECHOCARDIOGRAM COMPLETE Result Date: 03/15/2024    ECHOCARDIOGRAM REPORT   Patient Name:   Vanessa Christensen Date of Exam: 03/15/2024 Medical Rec #:  [id number]     Height:       62.0 in Accession #:    [id number]    Weight:       197.3  lb Date of Birth:  Oct 09, 1950     BSA:          1.901 m Patient Age:    73 years      BP:           130/106 mmHg Patient Gender: F             HR:           86 bpm. Exam Location:  Inpatient Procedure: 2D Echo, Cardiac Doppler and Color Doppler (Both Spectral and Color  Flow Doppler were utilized during procedure). Indications:    Stroke i63.9  History:        Patient has no prior history of Echocardiogram examinations.                 Risk Factors:Hypertension, Dyslipidemia and Sleep Apnea.  Sonographer:    Damien Senior RDCS Referring Phys: LEOMA DONALDA HERO Kyah Buesing IMPRESSIONS  1. Left ventricular ejection fraction, by estimation, is 55 to 60%. The left ventricle has normal function. The left ventricle has no regional wall motion abnormalities. Left ventricular diastolic parameters are consistent with Grade I diastolic dysfunction (impaired relaxation).  2. Right ventricular systolic function is normal. The right ventricular size is normal. Tricuspid regurgitation signal is inadequate for assessing PA pressure.  3. Left atrial size was mildly dilated.  4. The mitral valve is degenerative. Trivial mitral valve regurgitation. Mild mitral stenosis. The mean mitral valve gradient is 4.0 mmHg with average heart rate of 87 bpm. Moderate mitral annular calcification.  5. The aortic valve has an indeterminant number of cusps. Aortic valve regurgitation is mild. Aortic valve sclerosis/calcification is present, without any evidence of aortic stenosis.  6. The inferior vena cava is dilated in size with >50% respiratory variability, suggesting right atrial pressure of 8 mmHg. FINDINGS  Left Ventricle: Left ventricular ejection fraction, by estimation, is 55 to 60%. The left ventricle has normal function. The left ventricle has no regional wall motion abnormalities. The left ventricular internal cavity size was normal in size. There is  no left ventricular hypertrophy. Left ventricular diastolic function could not be  evaluated due to mitral annular calcification (moderate or greater). Left ventricular diastolic parameters are consistent with Grade I diastolic dysfunction (impaired relaxation). Right Ventricle: The right ventricular size is normal. No increase in right ventricular wall thickness. Right ventricular systolic function is normal. Tricuspid regurgitation signal is inadequate for assessing PA pressure. Left Atrium: Left atrial size was mildly dilated. Right Atrium: Right atrial size was normal in size. Pericardium: Trivial pericardial effusion is present. Presence of epicardial fat layer. Mitral Valve: The mitral valve is degenerative in appearance. Moderate mitral annular calcification. Trivial mitral valve regurgitation. Mild mitral valve stenosis. MV peak gradient, 9.4 mmHg. The mean mitral valve gradient is 4.0 mmHg with average heart  rate of 87 bpm. Tricuspid Valve: The tricuspid valve is normal in structure. Tricuspid valve regurgitation is trivial. Aortic Valve: The aortic valve has an indeterminant number of cusps. Aortic valve regurgitation is mild. Aortic valve sclerosis/calcification is present, without any evidence of aortic stenosis. Aortic valve mean gradient measures 6.0 mmHg. Aortic valve peak gradient measures 10.0 mmHg. Aortic valve area, by VTI measures 1.54 cm. Pulmonic Valve: The pulmonic valve was grossly normal. Pulmonic valve regurgitation is not visualized. Aorta: The aortic root and ascending aorta are structurally normal, with no evidence of dilitation. Venous: The inferior vena cava is dilated in size with greater than 50% respiratory variability, suggesting right atrial pressure of 8 mmHg. IAS/Shunts: There is right bowing of the interatrial septum, suggestive of elevated left atrial pressure. No atrial level shunt detected by color flow Doppler.  LEFT VENTRICLE PLAX 2D LVIDd:         4.70 cm   Diastology LVIDs:         3.50 cm   LV e' medial:    7.29 cm/s LV PW:         0.90 cm   LV E/e'  medial:  10.0 LV IVS:  1.00 cm   LV e' lateral:   4.68 cm/s LVOT diam:     2.00 cm   LV E/e' lateral: 15.6 LV SV:         55 LV SV Index:   29 LVOT Area:     3.14 cm LV IVRT:       116 msec  RIGHT VENTRICLE RV S prime:     11.50 cm/s TAPSE (M-mode): 2.3 cm LEFT ATRIUM             Index        RIGHT ATRIUM           Index LA diam:        4.00 cm 2.10 cm/m   RA Area:     13.10 cm LA Vol (A2C):   51.3 ml 26.99 ml/m  RA Volume:   28.80 ml  15.15 ml/m LA Vol (A4C):   48.5 ml 25.52 ml/m LA Biplane Vol: 54.3 ml 28.57 ml/m  AORTIC VALVE AV Area (Vmax):    1.83 cm AV Area (Vmean):   1.85 cm AV Area (VTI):     1.54 cm AV Vmax:           158.00 cm/s AV Vmean:          122.000 cm/s AV VTI:            0.359 m AV Peak Grad:      10.0 mmHg AV Mean Grad:      6.0 mmHg LVOT Vmax:         92.20 cm/s LVOT Vmean:        71.700 cm/s LVOT VTI:          0.176 m LVOT/AV VTI ratio: 0.49  AORTA Ao Root diam: 2.70 cm Ao Asc diam:  2.80 cm MITRAL VALVE MV Area (PHT): 3.46 cm     SHUNTS MV Area VTI:   2.71 cm     Systemic VTI:  0.18 m MV Peak grad:  9.4 mmHg     Systemic Diam: 2.00 cm MV Mean grad:  4.0 mmHg MV Vmax:       1.54 m/s MV Vmean:      87.9 cm/s MV Decel Time: 219 msec MV E velocity: 73.10 cm/s MV A velocity: 146.00 cm/s MV E/A ratio:  0.50 Mihai Croitoru MD Electronically signed by Jerel Balding MD Signature Date/Time: 03/15/2024/3:02:31 PM    Final    CT ANGIO HEAD NECK W WO CM Result Date: 03/14/2024 EXAM: CT HEAD WITHOUT AND CTA HEAD AND NECK WITH AND WITHOUT 03/14/2024 02:44:29 PM TECHNIQUE: CTA of the head and neck was performed with and without the administration of 75 mL of iohexol  (OMNIPAQUE ) 350 MG/ML injection. Noncontrast CT of the head with reconstructed 2-D images are also provided for review. Multiplanar 2D and/or 3D reformatted images are provided for review. Automated exposure control, iterative reconstruction, and/or weight based adjustment of the mA/kV was utilized to reduce the radiation  dose to as low as reasonably achievable. COMPARISON: MRI brain 03/14/2024 CLINICAL HISTORY: Neuro deficit, acute, stroke suspected; Stroke/TIA, determine embolic source. FINDINGS: CT HEAD: BRAIN AND VENTRICLES: The acute infarct seen on recent MRI in the splenium of the corpus callosum is not resolved by CT. No acute intracranial hemorrhage. No significant mass effect. No midline shift. No extra-axial fluid collection. Stable background of moderate-to-severe chronic small vessel disease. No new loss of gray-white differentiation. No hydrocephalus. ORBITS: No acute abnormality. SINUSES AND MASTOIDS: No acute abnormality. CTA NECK: AORTIC  ARCH AND ARCH VESSELS: No dissection or arterial injury. No significant stenosis of the brachiocephalic or subclavian arteries. Prior right hemithyroidectomy with heterogeneous enlargement of the left thyroid  lobe, better evaluated on recent thyroid  ultrasound. CERVICAL CAROTID ARTERIES: No dissection, arterial injury, or hemodynamically significant stenosis by NASCET criteria. CERVICAL VERTEBRAL ARTERIES: Left arm and vertebral artery. No dissection, arterial injury, or significant stenosis. LUNGS AND MEDIASTINUM: Unremarkable. SOFT TISSUES: No acute abnormality. BONES: No acute abnormality. 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 No significant stenosis of the internal carotid arteries. No significant stenosis of the anterior cerebral arteries. No significant stenosis of the middle cerebral arteries. No aneurysm. POSTERIOR CIRCULATION: Persistent fetal origin of the left PCA with hypoplastic left P1 segment. No significant stenosis of the posterior cerebral arteries. No significant stenosis of the basilar artery. No significant stenosis of the vertebral arteries. No aneurysm. OTHER: No dural venous sinus thrombosis on this non-dedicated study. IMPRESSION: 1. 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 No acute hemorrhage or significant mass effect. 2. No large vessel occlusion or hemodynamically significant stenosis of the head or neck vessels. Electronically signed by: Ryan Chess MD 03/14/2024 03:02 PM EDT RP Workstation: [id number]   MR BRAIN WO CONTRAST Result Date: 03/14/2024 EXAM: MR Brain without Intravenous Contrast. CLINICAL HISTORY: 73 year old female with syncope/presyncope, dizziness, and spinning sensation, cerebrovascular cause suspected. TECHNIQUE: Magnetic resonance images of the brain without intravenous contrast in multiple planes. CONTRAST: Without. COMPARISON: None provided. FINDINGS: BRAIN: A solitary small area of restricted diffusion is present in the right splenium of the corpus callosum on series 8 image 84, measuring 6 mm. T2 and FLAIR hyperintensity is noted in this region. Confluent and widespread bilateral white matter T2 and FLAIR hyperintensity is present elsewhere. Corpus callosum volume is maintained. There is bilateral deep white matter involvement. Similar moderate T2 and FLAIR heterogeneity is seen throughout the bilateral deep gray nuclei (series 10 image 15). The brainstem is less affected. The cerebellum appears negative. No cortical encephalomalacia is identified. Chronic microhemorrhages are present in the brain, most conspicuous in the deep granuloma and brainstem; the extent does not rise to the level of amyloid angiopathy. Flow voids are preserved, and the distal left vertebral artery appears to be dominant. No intracranial mass or hemorrhage. No midline shift or extra-axial fluid collection. No cerebellar tonsillar ectopia. VENTRICLES: No hydrocephalus. ORBITS: Postoperative changes to both globes. SINUSES AND MASTOIDS: The sinuses and mastoid air cells are clear. BONES: No acute fracture or focal osseous lesion. IMPRESSION: 1. 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 No acute hemorrhage or mass effect. 2. Underlying  signal changes of advanced chronic small vessel disease in the bilateral white matter and deep gray nuclei. Electronically signed by: Helayne Hurst MD 03/14/2024 12:40 PM EDT RP Workstation: [id number]     PERTINENT LAB RESULTS: CBC: Recent Labs    03/14/24 1350 03/15/24 0500  WBC 4.3 4.3  HGB 11.7* 11.7*  HCT 36.0 35.6*  PLT 147* 157   CMET CMP     Component Value Date/Time   NA 139 03/15/2024 0500   NA 141 12/08/2020 1150   NA 141 07/13/2016 1015   K 3.4 (L) 03/15/2024 0500   K 4.3 07/13/2016 1015   CL 105 03/15/2024 0500   CO2 19 (L) 03/15/2024 0500   CO2 24 07/13/2016 1015   GLUCOSE 88 03/15/2024 0500   GLUCOSE 89  07/13/2016 1015   BUN 10 03/15/2024 0500   BUN 21 12/08/2020 1150   BUN 17.2 07/13/2016 1015   CREATININE 0.77 03/15/2024 0500   CREATININE 0.73 07/31/2022 0857   CREATININE 0.7 07/13/2016 1015   CALCIUM  9.3 03/15/2024 0500   CALCIUM  9.6 07/13/2016 1015   PROT 7.0 01/15/2023 0913   PROT 6.8 12/08/2020 1150   PROT 6.7 07/13/2016 1015   ALBUMIN 4.5 01/15/2023 0913   ALBUMIN 4.8 (H) 12/08/2020 1150   ALBUMIN 4.0 07/13/2016 1015   AST 26 01/15/2023 0913   AST 22 07/31/2022 0857   AST 22 07/13/2016 1015   ALT 28 01/15/2023 0913   ALT 26 07/31/2022 0857   ALT 28 07/13/2016 1015   ALKPHOS 60 01/15/2023 0913   ALKPHOS 70 07/13/2016 1015   BILITOT 0.5 01/15/2023 0913   BILITOT 0.6 07/31/2022 0857   BILITOT 0.57 07/13/2016 1015   GFR 74.97 01/15/2023 0913   EGFR 94 12/08/2020 1150   GFRNONAA >60 03/15/2024 0500   GFRNONAA >60 07/31/2022 0857    GFR Estimated Creatinine Clearance: 63.2 mL/min (by C-G formula based on SCr of 0.77 mg/dL). No results for input(s): LIPASE, AMYLASE in the last 72 hours. No results for input(s): CKTOTAL, CKMB, CKMBINDEX, TROPONINI in the last 72 hours. Invalid input(s): POCBNP No results for input(s): DDIMER in the last 72 hours. Recent Labs    03/15/24 0500  HGBA1C 4.7*   Recent Labs     03/15/24 0500  CHOL 122  HDL 53  LDLCALC 59  TRIG 52  CHOLHDL 2.3   No results for input(s): TSH, T4TOTAL, T3FREE, THYROIDAB in the last 72 hours.  Invalid input(s): FREET3 No results for input(s): VITAMINB12, FOLATE, FERRITIN, TIBC, IRON, RETICCTPCT in the last 72 hours. Coags: Recent Labs    03/15/24 0500  INR 1.1   Microbiology: No results found for this or any previous visit (from the past 240 hours).  FURTHER DISCHARGE INSTRUCTIONS:  Get Medicines reviewed and adjusted: Please take all your medications with you for your next visit with your Primary MD  Laboratory/radiological data: Please request your Primary MD to go over all hospital tests and procedure/radiological results at the follow up, please ask your Primary MD to get all Hospital records sent to his/her office.  In some cases, they will be blood work, cultures and biopsy results pending at the time of your discharge. Please request that your primary care M.D. goes through all the records of your hospital data and follows up on these results.  Also Note the following: If you experience worsening of your admission symptoms, develop shortness of breath, life threatening emergency, suicidal or homicidal thoughts you must seek medical attention immediately by calling 911 or calling your MD immediately  if symptoms less severe.  You must read complete instructions/literature along with all the possible adverse reactions/side effects for all the Medicines you take and that have been prescribed to you. Take any new Medicines after you have completely understood and accpet all the possible adverse reactions/side effects.   Do not drive when taking Pain medications or sleeping medications (Benzodaizepines)  Do not take more than prescribed Pain, Sleep and Anxiety Medications. It is not advisable to combine anxiety,sleep and pain medications without talking with your primary care practitioner  Special  Instructions: If you have smoked or chewed Tobacco  in the last 2 yrs please stop smoking, stop any regular Alcohol  and or any Recreational drug use.  Wear Seat belts while driving.  Please note:  You were cared for by a hospitalist during your hospital stay. Once you are discharged, your primary care physician will handle any further medical issues. Please note that NO REFILLS for any discharge medications will be authorized once you are discharged, as it is imperative that you return to your primary care physician (or establish a relationship with a primary care physician if you do not have one) for your post hospital discharge needs so that they can reassess your need for medications and monitor your lab values.  Total Time spent coordinating discharge including counseling, education and face to face time equals greater than 30 minutes.  SignedBETHA Donalda Applebaum 03/16/2024 10:19 AM

## 2024-03-15 NOTE — Progress Notes (Signed)
 Echocardiogram 2D Echocardiogram has been performed.  Vanessa Christensen [name] RDCS 03/15/2024, 2:01 PM

## 2024-03-16 ENCOUNTER — Other Ambulatory Visit: Payer: Self-pay | Admitting: Neurology

## 2024-03-16 DIAGNOSIS — I639 Cerebral infarction, unspecified: Secondary | ICD-10-CM

## 2024-03-16 DIAGNOSIS — H8110 Benign paroxysmal vertigo, unspecified ear: Secondary | ICD-10-CM

## 2024-03-16 NOTE — Progress Notes (Addendum)
 STROKE TEAM PROGRESS NOTE   SUBJECTIVE (INTERVAL HISTORY) No family is at the bedside.  Overall her condition is rapidly improving. Pt denies any symptoms at this time. Pending echo and pt wants to go home ASAP.   She stated that she was getting out of bed yesterday and she had room spinning, lasted one minute and resolved. She had similar episode twice before and was diagnosed with BPPV and was taught how to do the Epley maneuver.    OBJECTIVE Temp:  [99.3 F (37.4 C)] 99.3 F (37.4 C) (10/11 1157) Pulse Rate:  [83] 83 (10/11 1157) Resp:  [16] 16 (10/11 1157) BP: (130)/(106) 130/106 (10/11 1157)  No results for input(s): GLUCAP in the last 168 hours. Recent Labs  Lab 03/14/24 1350 03/15/24 0500  NA 141 139  K 4.0 3.4*  CL 104 105  CO2 24 19*  GLUCOSE 91 88  BUN 13 10  CREATININE 0.69 0.77  CALCIUM  9.8 9.3   No results for input(s): AST, ALT, ALKPHOS, BILITOT, PROT, ALBUMIN in the last 168 hours. Recent Labs  Lab 03/14/24 1350 03/15/24 0500  WBC 4.3 4.3  NEUTROABS 2.3  --   HGB 11.7* 11.7*  HCT 36.0 35.6*  MCV 90.5 90.1  PLT 147* 157   No results for input(s): CKTOTAL, CKMB, CKMBINDEX, TROPONINI in the last 168 hours. Recent Labs    03/15/24 0500  LABPROT 15.1  INR 1.1   No results for input(s): COLORURINE, LABSPEC, PHURINE, GLUCOSEU, HGBUR, BILIRUBINUR, KETONESUR, PROTEINUR, UROBILINOGEN, NITRITE, LEUKOCYTESUR in the last 72 hours.  Invalid input(s): APPERANCEUR     Component Value Date/Time   CHOL 122 03/15/2024 0500   CHOL 189 12/08/2020 1150   TRIG 52 03/15/2024 0500   HDL 53 03/15/2024 0500   HDL 47 12/08/2020 1150   CHOLHDL 2.3 03/15/2024 0500   VLDL 10 03/15/2024 0500   LDLCALC 59 03/15/2024 0500   LDLCALC 127 (H) 12/08/2020 1150   Lab Results  Component Value Date   HGBA1C 4.7 (L) 03/15/2024   No results found for: LABOPIA, COCAINSCRNUR, LABBENZ, AMPHETMU, THCU, LABBARB  No results  for input(s): ETH in the last 168 hours.  I have personally reviewed the radiological images below and agree with the radiology interpretations.  ECHOCARDIOGRAM COMPLETE Result Date: 03/15/2024    ECHOCARDIOGRAM REPORT   Patient Name:   Vanessa Christensen Date of Exam: 03/15/2024 Medical Rec #:  [id number]     Height:       62.0 in Accession #:    [id number]    Weight:       197.3 lb Date of Birth:  03/26/1951     BSA:          1.901 m Patient Age:    73 years      BP:           130/106 mmHg Patient Gender: F             HR:           86 bpm. Exam Location:  Inpatient Procedure: 2D Echo, Cardiac Doppler and Color Doppler (Both Spectral and Color            Flow Doppler were utilized during procedure). Indications:    Stroke i63.9  History:        Patient has no prior history of Echocardiogram examinations.                 Risk Factors:Hypertension, Dyslipidemia and Sleep Apnea.  Sonographer:  [facility] Senior RDCS Referring Phys: [id number] DONALDA M GHIMIRE IMPRESSIONS  1. Left ventricular ejection fraction, by estimation, is 55 to 60%. The left ventricle has normal function. The left ventricle has no regional wall motion abnormalities. Left ventricular diastolic parameters are consistent with Grade I diastolic dysfunction (impaired relaxation).  2. Right ventricular systolic function is normal. The right ventricular size is normal. Tricuspid regurgitation signal is inadequate for assessing PA pressure.  3. Left atrial size was mildly dilated.  4. The mitral valve is degenerative. Trivial mitral valve regurgitation. Mild mitral stenosis. The mean mitral valve gradient is 4.0 mmHg with average heart rate of 87 bpm. Moderate mitral annular calcification.  5. The aortic valve has an indeterminant number of cusps. Aortic valve regurgitation is mild. Aortic valve sclerosis/calcification is present, without any evidence of aortic stenosis.  6. The inferior vena cava is dilated in size with >50% respiratory variability,  suggesting right atrial pressure of 8 mmHg. FINDINGS  Left Ventricle: Left ventricular ejection fraction, by estimation, is 55 to 60%. The left ventricle has normal function. The left ventricle has no regional wall motion abnormalities. The left ventricular internal cavity size was normal in size. There is  no left ventricular hypertrophy. Left ventricular diastolic function could not be evaluated due to mitral annular calcification (moderate or greater). Left ventricular diastolic parameters are consistent with Grade I diastolic dysfunction (impaired relaxation). Right Ventricle: The right ventricular size is normal. No increase in right ventricular wall thickness. Right ventricular systolic function is normal. Tricuspid regurgitation signal is inadequate for assessing PA pressure. Left Atrium: Left atrial size was mildly dilated. Right Atrium: Right atrial size was normal in size. Pericardium: Trivial pericardial effusion is present. Presence of epicardial fat layer. Mitral Valve: The mitral valve is degenerative in appearance. Moderate mitral annular calcification. Trivial mitral valve regurgitation. Mild mitral valve stenosis. MV peak gradient, 9.4 mmHg. The mean mitral valve gradient is 4.0 mmHg with average heart  rate of 87 bpm. Tricuspid Valve: The tricuspid valve is normal in structure. Tricuspid valve regurgitation is trivial. Aortic Valve: The aortic valve has an indeterminant number of cusps. Aortic valve regurgitation is mild. Aortic valve sclerosis/calcification is present, without any evidence of aortic stenosis. Aortic valve mean gradient measures 6.0 mmHg. Aortic valve peak gradient measures 10.0 mmHg. Aortic valve area, by VTI measures 1.54 cm. Pulmonic Valve: The pulmonic valve was grossly normal. Pulmonic valve regurgitation is not visualized. Aorta: The aortic root and ascending aorta are structurally normal, with no evidence of dilitation. Venous: The inferior vena cava is dilated in size with  greater than 50% respiratory variability, suggesting right atrial pressure of 8 mmHg. IAS/Shunts: There is right bowing of the interatrial septum, suggestive of elevated left atrial pressure. No atrial level shunt detected by color flow Doppler.  LEFT VENTRICLE PLAX 2D LVIDd:         4.70 cm   Diastology LVIDs:         3.50 cm   LV e' medial:    7.29 cm/s LV PW:         0.90 cm   LV E/e' medial:  10.0 LV IVS:        1.00 cm   LV e' lateral:   4.68 cm/s LVOT diam:     2.00 cm   LV E/e' lateral: 15.6 LV SV:         55 LV SV Index:   29 LVOT Area:     3.14 cm LV IVRT:  116 msec  RIGHT VENTRICLE RV S prime:     11.50 cm/s TAPSE (M-mode): 2.3 cm LEFT ATRIUM             Index        RIGHT ATRIUM           Index LA diam:        4.00 cm 2.10 cm/m   RA Area:     13.10 cm LA Vol (A2C):   51.3 ml 26.99 ml/m  RA Volume:   28.80 ml  15.15 ml/m LA Vol (A4C):   48.5 ml 25.52 ml/m LA Biplane Vol: 54.3 ml 28.57 ml/m  AORTIC VALVE AV Area (Vmax):    1.83 cm AV Area (Vmean):   1.85 cm AV Area (VTI):     1.54 cm AV Vmax:           158.00 cm/s AV Vmean:          122.000 cm/s AV VTI:            0.359 m AV Peak Grad:      10.0 mmHg AV Mean Grad:      6.0 mmHg LVOT Vmax:         92.20 cm/s LVOT Vmean:        71.700 cm/s LVOT VTI:          0.176 m LVOT/AV VTI ratio: 0.49  AORTA Ao Root diam: 2.70 cm Ao Asc diam:  2.80 cm MITRAL VALVE MV Area (PHT): 3.46 cm     SHUNTS MV Area VTI:   2.71 cm     Systemic VTI:  0.18 m MV Peak grad:  9.4 mmHg     Systemic Diam: 2.00 cm MV Mean grad:  4.0 mmHg MV Vmax:       1.54 m/s MV Vmean:      87.9 cm/s MV Decel Time: 219 msec MV E velocity: 73.10 cm/s MV A velocity: 146.00 cm/s MV E/A ratio:  0.50 Mihai Croitoru MD Electronically signed by Jerel Balding MD Signature Date/Time: 03/15/2024/3:02:31 PM    Final    CT ANGIO HEAD NECK W WO CM Result Date: 03/14/2024 EXAM: CT HEAD WITHOUT AND CTA HEAD AND NECK WITH AND WITHOUT 03/14/2024 02:44:29 PM TECHNIQUE: CTA of the head and neck was  performed with and without the administration of 75 mL of iohexol  (OMNIPAQUE ) 350 MG/ML injection. Noncontrast CT of the head with reconstructed 2-D images are also provided for review. Multiplanar 2D and/or 3D reformatted images are provided for review. Automated exposure control, iterative reconstruction, and/or weight based adjustment of the mA/kV was utilized to reduce the radiation dose to as low as reasonably achievable. COMPARISON: MRI brain 03/14/2024 CLINICAL HISTORY: Neuro deficit, acute, stroke suspected; Stroke/TIA, determine embolic source. FINDINGS: CT HEAD: BRAIN AND VENTRICLES: The acute infarct seen on recent MRI in the splenium of the corpus callosum is not resolved by CT. No acute intracranial hemorrhage. No significant mass effect. No midline shift. No extra-axial fluid collection. Stable background of moderate-to-severe chronic small vessel disease. No new loss of gray-white differentiation. No hydrocephalus. ORBITS: No acute abnormality. SINUSES AND MASTOIDS: No acute abnormality. CTA NECK: AORTIC ARCH AND ARCH VESSELS: No dissection or arterial injury. No significant stenosis of the brachiocephalic or subclavian arteries. Prior right hemithyroidectomy with heterogeneous enlargement of the left thyroid  lobe, better evaluated on recent thyroid  ultrasound. CERVICAL CAROTID ARTERIES: No dissection, arterial injury, or hemodynamically significant stenosis by NASCET criteria. CERVICAL VERTEBRAL ARTERIES: Left arm and vertebral artery. No  dissection, arterial injury, or significant stenosis. LUNGS AND MEDIASTINUM: Unremarkable. SOFT TISSUES: No acute abnormality. BONES: No acute abnormality. 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 No significant stenosis of the internal carotid arteries. No significant stenosis of the anterior cerebral arteries. No significant stenosis of the middle cerebral arteries. No aneurysm. POSTERIOR  CIRCULATION: Persistent fetal origin of the left PCA with hypoplastic left P1 segment. No significant stenosis of the posterior cerebral arteries. No significant stenosis of the basilar artery. No significant stenosis of the vertebral arteries. No aneurysm. OTHER: No dural venous sinus thrombosis on this non-dedicated study. IMPRESSION: 1. 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 No acute hemorrhage or significant mass effect. 2. No large vessel occlusion or hemodynamically significant stenosis of the head or neck vessels. Electronically signed by: Ryan Chess MD 03/14/2024 03:02 PM EDT RP Workstation: [id number]   MR BRAIN WO CONTRAST Result Date: 03/14/2024 EXAM: MR Brain without Intravenous Contrast. CLINICAL HISTORY: 73 year old female with syncope/presyncope, dizziness, and spinning sensation, cerebrovascular cause suspected. TECHNIQUE: Magnetic resonance images of the brain without intravenous contrast in multiple planes. CONTRAST: Without. COMPARISON: None provided. FINDINGS: BRAIN: A solitary small area of restricted diffusion is present in the right splenium of the corpus callosum on series 8 image 84, measuring 6 mm. T2 and FLAIR hyperintensity is noted in this region. Confluent and widespread bilateral white matter T2 and FLAIR hyperintensity is present elsewhere. Corpus callosum volume is maintained. There is bilateral deep white matter involvement. Similar moderate T2 and FLAIR heterogeneity is seen throughout the bilateral deep gray nuclei (series 10 image 15). The brainstem is less affected. The cerebellum appears negative. No cortical encephalomalacia is identified. Chronic microhemorrhages are present in the brain, most conspicuous in the deep granuloma and brainstem; the extent does not rise to the level of amyloid angiopathy. Flow voids are preserved, and the distal left vertebral artery appears to be dominant. No intracranial mass or  hemorrhage. No midline shift or extra-axial fluid collection. No cerebellar tonsillar ectopia. VENTRICLES: No hydrocephalus. ORBITS: Postoperative changes to both globes. SINUSES AND MASTOIDS: The sinuses and mastoid air cells are clear. BONES: No acute fracture or focal osseous lesion. IMPRESSION: 1. 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 No acute hemorrhage or mass effect. 2. Underlying signal changes of advanced chronic small vessel disease in the bilateral white matter and deep gray nuclei. Electronically signed by: Helayne Hurst MD 03/14/2024 12:40 PM EDT RP Workstation: [id number]     PHYSICAL EXAM  Temp:  [99.3 F (37.4 C)] 99.3 F (37.4 C) (10/11 1157) Pulse Rate:  [83] 83 (10/11 1157) Resp:  [16] 16 (10/11 1157) BP: (130)/(106) 130/106 (10/11 1157)  General - Well nourished, well developed, in no apparent distress.  Ophthalmologic - fundi not visualized due to noncooperation.  Cardiovascular - Regular rhythm and rate.  Mental Status -  Level of arousal and orientation to time, place, and person were intact. Language including expression, naming, repetition, comprehension was assessed and found intact. Fund of Knowledge was assessed and was intact.  Cranial Nerves II - XII - II - Visual field intact OU. III, IV, VI - Extraocular movements intact. V - Facial sensation intact bilaterally. VII - Facial movement intact bilaterally. VIII - Hearing & vestibular intact bilaterally. X - Palate elevates symmetrically. XI - Chin turning & shoulder shrug intact bilaterally. XII - Tongue protrusion intact.  Motor Strength -  The patient's strength was normal in all extremities and pronator drift was absent.  Bulk was normal and fasciculations were absent.   Motor Tone - Muscle tone was assessed at the neck and appendages and was normal.  Reflexes - The patient's reflexes were symmetrical in all extremities and she had no pathological reflexes.  Sensory - Light touch,  temperature/pinprick were assessed and were symmetrical.    Coordination - The patient had normal movements in the hands and feet with no ataxia or dysmetria.  Tremor was absent.  Gait and Station - deferred.   ASSESSMENT/PLAN Vanessa Christensen is a 73 y.o. female with history of HTN, HLD, OSA, obesity admitted for vertigo. No TNK given due to outside window.    BPPV Pt was getting out of bed yesterday and she had room spinning, lasted one minute and resolved.  She had similar episode twice before and was diagnosed with BPPV and was taught how to do the Epley maneuver.  Currently symptoms free Recommend to continue follow up with PCP and ENT for BPPV management.  Stroke, subacute, incidental finding:  right splenium small subacute infarct likely secondary to small vessel disease source CT no acute finding CTA head and neck unremarkable MRI 6 mm right splenium lacunar infarct. No acute hemorrhage or mass effect. 2D Echo  EF 55-60% LDL 59 HgbA1c 4.7 Lovenox for VTE prophylaxis aspirin  81 mg daily prior to admission but off ASA for one week, now on aspirin  81 mg daily and clopidogrel 75 mg daily DAPT for 3 weeks and then ASA alone Patient counseled to be compliant with her antithrombotic medications Ongoing aggressive stroke risk factor management Therapy recommendations:  outpt PT Disposition:  home  Hypertension Stable Long term BP goal normotensive  Hyperlipidemia Home meds:  crestor  10  LDL 59, goal < 70 Now on crestor  10 Continue statin at discharge  Other Stroke Risk Factors Advanced age Obesity, Body mass index is 36.09 kg/m.  Obstructive sleep apnea, on CPAP at home  Other Active Problems   Hospital day # 1  Neurology will sign off. Please call with questions. Pt will follow up with Dr. Gregg at [facility] in about 4 weeks. Thanks for the consult.   Ary Cummins, MD PhD Stroke Neurology 03/16/2024 8:36 AM  I spent additional 30 min inpatient face-to-face time  with the patient and family, reviewing test results, images and medication, and discussing the diagnosis, treatment plan and potential prognosis. This patient's care requiresreview of multiple databases, neurological assessment, discussion with family, other specialists and medical decision making of high complexity.      To contact Stroke Continuity provider, please refer to [email or web address]. After hours, contact General Neurology

## 2024-03-17 ENCOUNTER — Other Ambulatory Visit: Payer: Self-pay | Admitting: Family Medicine

## 2024-03-17 ENCOUNTER — Telehealth: Payer: Self-pay

## 2024-03-17 MED ORDER — MIRABEGRON ER 25 MG PO TB24: 25.0000 mg | ORAL_TABLET | Freq: Every day | ORAL | 3 refills | Status: AC

## 2024-03-17 NOTE — Telephone Encounter (Unsigned)
 Copied from CRM [id number]. Topic: Clinical - Medication Refill >> Mar 17, 2024 10:50 AM [place] E wrote: Medication: mirabegron  ER (MYRBETRIQ ) 25 MG TB24 tablet  Has the patient contacted their pharmacy? No (Agent: If no, request that the patient contact the pharmacy for the refill. If patient does not wish to contact the pharmacy document the reason why and proceed with request.) (Agent: If yes, when and what did the pharmacy advise?)  This is the patient's preferred pharmacy:  [id number] - [place], [place] - [address] [place] [address] Phone: [phone] Fax: [phone]   Is this the correct pharmacy for this prescription? Yes If no, delete pharmacy and type the correct one.   Has the prescription been filled recently? No  Is the patient out of the medication? Yes  Has the patient been seen for an appointment in the last year OR does the patient have an upcoming appointment? Yes  Can we respond through MyChart? Yes  Agent: Please be advised that Rx refills may take up to 3 business days. We ask that you follow-up with your pharmacy.

## 2024-03-17 NOTE — Transitions of Care (Post Inpatient/ED Visit) (Unsigned)
   03/17/2024  Name: Gentri Guardado MRN: [id number] DOB: 12-02-1950  Today's TOC FU Call Status: Today's TOC FU Call Status:: Unsuccessful Call (1st Attempt) Unsuccessful Call (1st Attempt) Date: 03/17/24  Attempted to reach the patient regarding the most recent Inpatient/ED visit.  Follow Up Plan: Additional outreach attempts will be made to reach the patient to complete the Transitions of Care (Post Inpatient/ED visit) call.   Signature Julian Lemmings, LPN [facility] Nurse Health Advisor Direct Dial [phone]

## 2024-03-18 NOTE — Transitions of Care (Post Inpatient/ED Visit) (Signed)
 03/18/2024  Name: Vanessa Christensen MRN: [id number] DOB: [birth year]  Today's TOC FU Call Status: Today's TOC FU Call Status:: Successful TOC FU Call Completed Unsuccessful Call (1st Attempt) Date: 03/17/24 [facility] FU Call Complete Date: 03/18/24 Patient's Name and Date of Birth confirmed.  Transition Care Management Follow-up Telephone Call Date of Discharge: 03/15/24 Discharge Facility: [facility]) Type of Discharge: Inpatient Admission Primary Inpatient Discharge Diagnosis:: cerebral infarction How have you been since you were released from the hospital?: Better Any questions or concerns?: No  Items Reviewed: Did you receive and understand the discharge instructions provided?: Yes Medications obtained,verified, and reconciled?: Yes (Medications Reviewed) Any new allergies since your discharge?: No Dietary orders reviewed?: Yes Do you have support at home?: Yes People in Home [RPT]: spouse  Medications Reviewed Today: Medications Reviewed Today     Reviewed by Emmitt Pan, LPN (Licensed Practical Nurse) on 03/18/24 at 1210  Med List Status: <None>   Medication Order Taking? Sig Documenting Provider Last Dose Status Informant  Ascorbic Acid  (VITAMIN C PO) [id number] Yes Take 1 tablet by mouth daily. [provider]  Active Self, Pharmacy Records           Med Note BLASE LAYMON CHRISTELLA [place] Jun 03, 2020  2:44 PM)    aspirin  EC 81 MG tablet [id number] Yes Take 1 tablet (81 mg total) by mouth daily. Swallow whole. Raenelle Donalda CHRISTELLA, MD  Active   B Complex-C (SUPER B COMPLEX PO) [id number] Yes Take 1 tablet by mouth daily after supper. [provider]  Active Self, Pharmacy Records           Med Note BLASE LAYMON CHRISTELLA   Thu Jun 03, 2020  2:45 PM)    buPROPion  (WELLBUTRIN  XL) 150 MG 24 hr tablet [id number] Yes TAKE 2 TABLETS BY MOUTH DAILY. Mahlon Comer BRAVO, MD  Active Self, Pharmacy Records           Med Note JACKOLYN WADDELL DEL   Dju Mar 15, 2024  8:37 AM)     cetirizine (ZYRTEC) 10 MG tablet [id number] Yes Take 10 mg by mouth daily. [provider]  Active Self, Pharmacy Records  Cholecalciferol  (VITAMIN D ) 50 MCG (2000 UT) tablet [id number] Yes Take 2,000 Units by mouth daily. [provider]  Active Self, Pharmacy Records  clonazePAM  (KLONOPIN ) 0.5 MG tablet [id number] Yes Take 1 tablet (0.5 mg total) by mouth 2 (two) times daily as needed for anxiety. Mahlon Comer BRAVO, MD  Active Self, Pharmacy Records  clopidogrel (PLAVIX) 75 MG tablet [id number] Yes Take 1 tablet (75 mg total) by mouth daily. Ghimire, Donalda CHRISTELLA, MD  Active   diphenhydramine -acetaminophen  (TYLENOL  PM) 25-500 MG TABS tablet [id number] Yes Take 2 tablets by mouth at bedtime as needed (sleep/pain). [provider]  Active Self, Pharmacy Records  dorzolamide -timolol  (COSOPT ) 2-0.5 % ophthalmic solution [id number] Yes Place 1 drop into both eyes 2 (two) times daily. [provider]  Active Self, Pharmacy Records  FLUoxetine  (PROZAC ) 40 MG capsule [id number] Yes Take 1 capsule (40 mg total) by mouth daily. Tabori, Katherine E, MD  Active Self, Pharmacy Records  levothyroxine  (SYNTHROID ) 75 MCG tablet [id number] Yes TAKE 1 TABLET BY MOUTH EVERY DAY Tabori, Katherine E, MD  Active Self, Pharmacy Records  losartan (COZAAR) 25 MG tablet [id number] Yes Take 1 tablet (25 mg total) by mouth daily. Raenelle Donalda CHRISTELLA, MD  Active   mirabegron  ER (MYRBETRIQ ) 25 MG TB24 tablet [id number] Yes Take 1 tablet (25 mg total) by  mouth daily. Tabori, Katherine E, MD  Active   psyllium (METAMUCIL) 58.6 % powder [id number] Yes Take 1 packet by mouth every other day. [provider]  Active Self, Pharmacy Records  rosuvastatin  (CRESTOR ) 10 MG tablet [id number] Yes TAKE 1 TABLET BY MOUTH EVERY DAY Tabori, Katherine E, MD  Active Self, Pharmacy Records  semaglutide -weight management (WEGOVY ) 1 MG/0.5ML SOAJ SQ injection [id number] Yes Inject 1 mg into the skin once a week. Delores Clayborne LABOR, DO  Active Self, Pharmacy Records  traZODone  (DESYREL ) 50 MG tablet [id number] Yes Take 0.5-1 tablets (25-50 mg total) by mouth at bedtime as needed. for sleep  Patient taking differently: Take 50 mg by mouth at bedtime. for sleep   Tabori, Katherine E, MD  Active Self, Pharmacy Records            Home Care and Equipment/Supplies:    Functional Questionnaire:    Follow up appointments reviewed: PCP Follow-up appointment confirmed?: Yes Date of PCP follow-up appointment?: 03/19/24 Follow-up Provider: [facility] Follow-up appointment confirmed?: Yes Date of Specialist follow-up appointment?: 03/24/24 Follow-Up Specialty Provider:: neuro Do you need transportation to your follow-up appointment?: No Do you understand care options if your condition(s) worsen?: Yes-patient verbalized understanding    SIGNATURE Julian Lemmings, LPN [facility] Nurse Health Advisor Direct Dial [phone]

## 2024-03-19 ENCOUNTER — Encounter: Payer: Self-pay | Admitting: Family Medicine

## 2024-03-19 ENCOUNTER — Ambulatory Visit: Admitting: Family Medicine

## 2024-03-19 VITALS — BP 134/72 | HR 68 | Temp 97.8°F | Ht 62.0 in | Wt 199.4 lb

## 2024-03-19 DIAGNOSIS — R42 Dizziness and giddiness: Secondary | ICD-10-CM | POA: Diagnosis not present

## 2024-03-19 DIAGNOSIS — I10 Essential (primary) hypertension: Secondary | ICD-10-CM

## 2024-03-19 DIAGNOSIS — F419 Anxiety disorder, unspecified: Secondary | ICD-10-CM | POA: Diagnosis not present

## 2024-03-19 DIAGNOSIS — Z8673 Personal history of transient ischemic attack (TIA), and cerebral infarction without residual deficits: Secondary | ICD-10-CM

## 2024-03-19 DIAGNOSIS — F32A Depression, unspecified: Secondary | ICD-10-CM | POA: Diagnosis not present

## 2024-03-19 DIAGNOSIS — I63331 Cerebral infarction due to thrombosis of right posterior cerebral artery: Secondary | ICD-10-CM

## 2024-03-19 LAB — HEPATIC FUNCTION PANEL
ALT: 18 U/L (ref 0–35)
AST: 18 U/L (ref 0–37)
Albumin: 4.4 g/dL (ref 3.5–5.2)
Alkaline Phosphatase: 55 U/L (ref 39–117)
Bilirubin, Direct: 0.1 mg/dL (ref 0.0–0.3)
Total Bilirubin: 0.5 mg/dL (ref 0.2–1.2)
Total Protein: 6.7 g/dL (ref 6.0–8.3)

## 2024-03-19 LAB — CBC WITH DIFFERENTIAL/PLATELET
Basophils Absolute: 0 K/uL (ref 0.0–0.1)
Basophils Relative: 0.4 % (ref 0.0–3.0)
Eosinophils Absolute: 0 K/uL (ref 0.0–0.7)
Eosinophils Relative: 0 % (ref 0.0–5.0)
HCT: 33.7 % — ABNORMAL LOW (ref 36.0–46.0)
Hemoglobin: 11.3 g/dL — ABNORMAL LOW (ref 12.0–15.0)
Lymphocytes Relative: 26.3 % (ref 12.0–46.0)
Lymphs Abs: 1.3 K/uL (ref 0.7–4.0)
MCHC: 33.4 g/dL (ref 30.0–36.0)
MCV: 86.8 fl (ref 78.0–100.0)
Monocytes Absolute: 1.1 K/uL — ABNORMAL HIGH (ref 0.1–1.0)
Monocytes Relative: 21.6 % — ABNORMAL HIGH (ref 3.0–12.0)
Neutro Abs: 2.5 K/uL (ref 1.4–7.7)
Neutrophils Relative %: 51.7 % (ref 43.0–77.0)
Platelets: 168 K/uL (ref 150.0–400.0)
RBC: 3.89 Mil/uL (ref 3.87–5.11)
RDW: 15.2 % (ref 11.5–15.5)
WBC: 4.9 K/uL (ref 4.0–10.5)

## 2024-03-19 LAB — BASIC METABOLIC PANEL WITH GFR
BUN: 12 mg/dL (ref 6–23)
CO2: 26 meq/L (ref 19–32)
Calcium: 9.1 mg/dL (ref 8.4–10.5)
Chloride: 103 meq/L (ref 96–112)
Creatinine, Ser: 0.64 mg/dL (ref 0.40–1.20)
GFR: 86.51 mL/min (ref >60.00)
Glucose, Bld: 82 mg/dL (ref 70–99)
Potassium: 4 meq/L (ref 3.5–5.1)
Sodium: 139 meq/L (ref 135–145)

## 2024-03-19 MED ORDER — MECLIZINE HCL 12.5 MG PO TABS: 12.5000 mg | ORAL_TABLET | Freq: Three times a day (TID) | ORAL | 0 refills | Status: AC | PRN

## 2024-03-19 MED ORDER — CLONAZEPAM 0.5 MG PO TABS: 0.5000 mg | ORAL_TABLET | Freq: Two times a day (BID) | ORAL | 1 refills | Status: AC | PRN

## 2024-03-19 NOTE — Patient Instructions (Signed)
 Follow up as needed or as scheduled We'll notify you of your lab results and make any changes if needed CONTINUE the Losartan daily for blood pressure USE the Meclizine  as needed for nausea Call with any questions or concerns Hang in there!!!

## 2024-03-19 NOTE — Progress Notes (Signed)
   Subjective:    Patient ID: Vanessa Christensen, female    DOB: 10-20-1950, 73 y.o.   MRN: [id number]  HPI Hospital f/u- pt was admitted 10/10-10/12 after presenting w/ acute dizziness and was found to have acute splenium lacunar infarct on MRI.  CTA unremarkable.  Neuro recommended ASA and Plavix x3 weeks followed by ASA alone.  Continue statin.  Was started on Losartan at time of d/c.  She is to f/u w/ [facility] and PT.  D/C summary recommended CMP and CBC.  Today reports feeling 'pretty good' w/ exception of episodic vertigo.  Some anxiety given recent stroke- asking for refill on Clonazepam  which is from 2024 and she never used.  Wants to review each medication and what it's for.  Denies CP, SOB, HA's, visual changes.   Review of Systems For ROS see HPI     Objective:   Physical Exam Constitutional:      General: She is not in acute distress.    Appearance: Normal appearance. She is well-developed. She is obese. She is not ill-appearing.  HENT:     Head: Normocephalic and atraumatic.  Eyes:     Conjunctiva/sclera: Conjunctivae normal.     Pupils: Pupils are equal, round, and reactive to light.  Neck:     Thyroid : No thyromegaly.  Cardiovascular:     Rate and Rhythm: Normal rate and regular rhythm.     Heart sounds: Normal heart sounds. No murmur heard. Pulmonary:     Effort: Pulmonary effort is normal. No respiratory distress.     Breath sounds: Normal breath sounds.  Abdominal:     General: There is no distension.     Palpations: Abdomen is soft.     Tenderness: There is no abdominal tenderness.  Musculoskeletal:     Cervical back: Normal range of motion and neck supple.  Lymphadenopathy:     Cervical: No cervical adenopathy.  Skin:    General: Skin is warm and dry.  Neurological:     General: No focal deficit present.     Mental Status: She is alert and oriented to person, place, and time.  Psychiatric:        Mood and Affect: Mood normal.        Behavior:  Behavior normal.        Thought Content: Thought content normal.           Assessment & Plan:

## 2024-03-19 NOTE — Assessment & Plan Note (Signed)
 Pt admits to increased anxiety after recent stroke.  She would like to have a refill of Clonazepam  to have on hand in case of panic- the current prescription is from 2024 and she never used it.  Refill sent.

## 2024-03-19 NOTE — Assessment & Plan Note (Signed)
 Ongoing issue.  Well controlled on Losartan 25mg  daily.  Currently asymptomatic- no CP, SOB, HA's, visual changes.  Check BMP since starting ARB but no anticipated med changes.

## 2024-03-19 NOTE — Assessment & Plan Note (Signed)
 New to provider.  Reviewed H&P, D/C summary, imaging, labs, consult notes.  Currently on ASA and Plavix.  Has f/u w/ Neuro in December.  Already on statin.  Recently started ARB for BP control.  Goal is risk reduction.

## 2024-03-19 NOTE — Assessment & Plan Note (Signed)
 Ongoing issue.  Refill Meclizine  for pt to use prn.  Has appt scheduled for next week w/ Neuro rehab.

## 2024-03-20 ENCOUNTER — Ambulatory Visit: Payer: Self-pay | Admitting: Family Medicine

## 2024-03-20 NOTE — Progress Notes (Signed)
 Pt has been notified.

## 2024-03-21 NOTE — Therapy (Signed)
 OUTPATIENT PHYSICAL THERAPY NEURO EVALUATION   Patient Name: Vanessa Christensen MRN: [id number] DOB:1951-01-31, 73 y.o., female Today's Date: 03/24/2024   PCP:    Mahlon Comer BRAVO, MD   REFERRING PROVIDER: Raenelle Donalda HERO, MD  END OF SESSION:  PT End of Session - 03/24/24 1239     Visit Number 1    Number of Visits 17    Date for Recertification  05/19/24    Authorization Type Medicare/BCBS Federal    PT Start Time 1149    PT Stop Time 1234    PT Time Calculation (min) 45 min    Activity Tolerance Patient tolerated treatment well    Behavior During Therapy [facility] for tasks assessed/performed          Past Medical History:  Diagnosis Date   Anemia    hx of  iron deficient   Anxiety    pt denies   Arthritis    hands and feet   Cancer (HCC) 10/04/2006   breast    left   Cataracts, both eyes    Depression    Depression    Diverticulosis of colon    GERD (gastroesophageal reflux disease)    Glaucoma    Hyperlipidemia    Hypertension    no meds   Joint pain    Knee pain    Obesity    OSA on CPAP    Osteoarthritis    Sleep apnea    no CPAP- no longer needed d/t weight loss   Thyroid  activity decreased    Past Surgical History:  Procedure Laterality Date   BREAST SURGERY Bilateral    CATARACT EXTRACTION     COLONOSCOPY     GLAUCOMA REPAIR     MASTECTOMY, RADICAL Bilateral    neulasta induced sterile abscesses     THYROIDECTOMY, PARTIAL     TONSILLECTOMY     TOTAL KNEE ARTHROPLASTY Right    TOTAL KNEE ARTHROPLASTY Left 10/27/2022   Procedure: LEFT TOTAL KNEE ARTHROPLASTY;  Surgeon: Vernetta Lonni GRADE, MD;  Location: [facility];  Service: Orthopedics;  Laterality: Left;   TOTAL KNEE REVISION Right 05/07/2020   Procedure: RIGHT TOTAL KNEE REVISION ARTHROPLASTY;  Surgeon: Vernetta Lonni GRADE, MD;  Location: [facility];  Service: Orthopedics;  Laterality: Right;   Patient Active Problem List   Diagnosis Date Noted   CVA (cerebral vascular accident) (HCC)  03/14/2024   Cellulitis of right leg 12/21/2023   Onychomycosis 12/21/2023   Atherosclerosis of artery 03/12/2023   Adrenal nodule 11/16/2022   Thyroid  nodule 11/16/2022   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   Generalized obesity 10/05/2022   Memory loss 06/12/2022   Essential hypertension 04/13/2022   Obesity (BMI 30-39.9) 04/13/2022   Stress incontinence 11/08/2021   Status post revision of total replacement of right knee 05/07/2020   Failed total knee, right, 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/2020   History of total right knee replacement 01/28/2020   Vitamin D  deficiency 08/29/2018   Insulin  resistance 08/29/2018   Other specified glaucoma 08/14/2018   Incontinence of urine in female 11/07/2017   Vertigo 11/07/2017   Left knee pain 10/25/2016   Genetic testing 08/08/2016   Hypothyroidism 12/02/2015   Anxiety and depression 12/02/2015   OAB (overactive bladder) 12/02/2015   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/02/2015   BMI 37.0-37.9, adult 12/02/2015    ONSET DATE: 10/10/125  REFERRING DIAG: I63.9 (ICD-10-CM) - CVA (cerebral vascular accident) (HCC)  THERAPY DIAG:  Dizziness and giddiness  Unsteadiness on feet  Other symptoms and signs involving the nervous system  Rationale for Evaluation and Treatment: Rehabilitation  SUBJECTIVE:                                                                                                                                                                                             SUBJECTIVE STATEMENT: Dizziness started 10/9 but got worse the next day. Patient reports that she had dizziness which prompted her to be worked up in the ED at which time they noted an acute CVA. Was told that this stroke was totally unrelated to her vertigo.   Vertigo is ongoing and worse with looking up/down, side to side, used to feel dizzy lying down in bed but no longer having this symptom.   Denies head  trauma, infection/illness, hearing loss, otalgia, changes in UE/LE weakness.   Reports tinnitus, somewhat blurred vision, photophobia, hx of migraines 40 years ago. Reports balance changes and using walker in the house. Not using AD at [facility]   Pt accompanied by: significant other  PERTINENT HISTORY: Anemia, anxiety, L breast CA s/p B mastectomy, depression, HLD, HTN, B TKA  PAIN:  Are you having pain? No  PRECAUTIONS: Fall  RED FLAGS: None   WEIGHT BEARING RESTRICTIONS: No  FALLS: Has patient fallen in last 6 months? Yes. Number of falls 1  LIVING ENVIRONMENT: Lives with: lives with their spouse Lives in: House/apartment Stairs: 2 step to enter with handrail; 2 story home with bedroom on 1st floor Has following equipment at home: Single point cane and Walker - 2 wheeled  PLOF: Independent  PATIENT GOALS: improve dizziness  OBJECTIVE:  Note: Objective measures were completed at Evaluation unless otherwise noted.  DIAGNOSTIC FINDINGS: 03/14/24 brain MRI: Acute 6 mm right splenium lacunar infarct. No acute hemorrhage or mass effect. 2. Underlying signal changes of advanced chronic small vessel disease in the bilateral white matter and deep gray nuclei.  COGNITION: Overall cognitive status: Within functional limits for tasks assessed; husband assists with subjective report    SENSATION: Pt reports slight N/T in R foot d/t neuropathy   COORDINATION: Alternating pronation/supination: slight dysmetria on L Alternating toe tap: WNL Finger to nose: WNL  Vitals: 147/96 mmHg, 96% spO2, 80bpm     POSTURE: rounded shoulders and forward head  GAIT: Findings: Assistive device utilized:Single point cane, Level of assistance: Min A, and Comments: requires assistance for balance from husband   VESTIBULAR ASSESSMENT   GENERAL OBSERVATION: pt wears glasses but not sure what kind   OCULOMOTOR EXAM: Eye alignment: L eye elevated in orbit   Ocular ROM: limited R eye  abduction   Spontaneous Nystagmus: absent   Gaze-Induced Nystagmus: absent   Smooth Pursuits: difficulty tracking R and c/o diplopia at 45 degrees gaze to each side   Saccades: disconjugate eye movement with R eye overshooting/missing target repeatedly and c/o diplopia with R gaze   Convergence/Divergence: 12 inches c/o diplopia cm    VESTIBULAR - OCULAR REFLEX:    Slow VOR: Normal   VOR Cancellation: Unable to Maintain Gaze; c/o diplopia   Head-Impulse Test: HIT Right: negative HIT Left: negative    POSITIONAL TESTING:   Right Sidelying: strong anxiety response and c/o dizziness with nystagmus but unable to identify d/t pt closing eyes Left Sidelying: negative                                                                                                                               TREATMENT DATE: 03/24/24    PATIENT EDUCATION: Education details: prognosis, POC, exam findings and need for MD f/u to address new oculomotor findings that were not documented in the hospital  Person educated: Patient and Spouse Education method: Explanation Education comprehension: verbalized understanding  HOME EXERCISE PROGRAM: Not yet initiated    GOALS: Goals reviewed with patient? Yes  SHORT TERM GOALS: Target date: 04/21/2024  Patient to be independent with initial HEP. Baseline: HEP initiated Goal status: INITIAL    LONG TERM GOALS: Target date: 05/19/2024  Patient to be independent with advanced HEP. Baseline: Not yet initiated  Goal status: INITIAL  Patient to report 0/10 dizziness with standing vertical and horizontal VOR for 30 seconds. Baseline: Unable Goal status: INITIAL  Patient will report 0/10 dizziness with bed mobility.  Baseline: Symptomatic  Goal status: INITIAL  Patient to demonstrate ambulation for 323ft with LRAD and supervision for safety.  Baseline: min A d/t imbalance  Goal status: INITIAL  Patient to score at least 15/24 on DGI in order to  decrease risk of falls. Baseline: NT Goal status: INITIAL   ASSESSMENT:  CLINICAL IMPRESSION:  Patient is a 73 y/o F presenting to OPPT with c/o dizziness and imbalance s/p hospitalization 03/14/24-03/15/24 for vertigo found to have acute lacunar CVA. Patient today presenting with Slight L UE dysmetria, imbalance and difficulty ambulating, L hypertropia, limited R eye abduction ROM, disconjugate gaze with smooth pursuit and saccades on R>L, convergence insufficiency, and dizziness with R sidelying test. 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 Plan to addressing positional vertigo while she undergoes further workup.  Prior to current episode, patient was independent. Would benefit from skilled PT services 1-2 x/week for 8 weeks to address aforementioned impairments in order to optimize level of function.    OBJECTIVE IMPAIRMENTS: Abnormal gait, decreased activity tolerance, decreased balance, decreased coordination, difficulty walking, and dizziness.   ACTIVITY LIMITATIONS: carrying, lifting, bending, sitting, standing, squatting, sleeping, stairs, transfers, bed mobility, bathing, toileting, dressing, reach over head, hygiene/grooming, locomotion level,  and caring for others  PARTICIPATION LIMITATIONS: meal prep, cleaning, laundry, driving, shopping, community activity, yard work, and church  PERSONAL FACTORS: Age, Behavior pattern, Past/current experiences, Time since onset of injury/illness/exacerbation, and 3+ comorbidities: Anemia, anxiety, L breast CA s/p B mastectomy, depression, HLD, HTN, B TKA are also affecting patient's functional outcome.   REHAB POTENTIAL: Good  CLINICAL DECISION MAKING: Evolving/moderate complexity  EVALUATION COMPLEXITY: Moderate  PLAN:  PT FREQUENCY: 1-2x/week  PT DURATION: 8 weeks  PLANNED INTERVENTIONS: 97164- PT Re-evaluation, 97750- Physical Performance  Testing, 97110-Therapeutic exercises, 97530- Therapeutic activity, 97112- Neuromuscular re-education, 97535- Self Care, [id number]- Manual therapy, [phone]- Gait training, [phone]- Canalith repositioning, Patient/Family education, Balance training, Stair training, Taping, Vestibular training, Cryotherapy, and Moist heat  PLAN FOR NEXT SESSION: positional testing and treat; DGI, initiate HEP    Louana Terrilyn Christians, PT, DPT 03/24/24 12:51 PM  [facility] at [facility] [address] [place], [place] [address] Phone # [phone] Fax # [phone]

## 2024-03-24 ENCOUNTER — Telehealth: Payer: Self-pay | Admitting: Physical Therapy

## 2024-03-24 ENCOUNTER — Other Ambulatory Visit: Payer: Self-pay

## 2024-03-24 ENCOUNTER — Ambulatory Visit: Attending: Internal Medicine | Admitting: Physical Therapy

## 2024-03-24 DIAGNOSIS — R42 Dizziness and giddiness: Secondary | ICD-10-CM | POA: Insufficient documentation

## 2024-03-24 DIAGNOSIS — R29818 Other symptoms and signs involving the nervous system: Secondary | ICD-10-CM | POA: Diagnosis not present

## 2024-03-24 DIAGNOSIS — R2681 Unsteadiness on feet: Secondary | ICD-10-CM | POA: Insufficient documentation

## 2024-03-24 NOTE — Telephone Encounter (Signed)
 Hi Dr. Mahlon and Dr. Gregg,  I evaluated Vanessa Christensen today in OPPT for dizziness s/p lacunar stroke. She presented with some abnormal oculomotor testing including: limited R eye abduction ROM, disconjugate gaze with smooth pursuit and saccades on R>L, convergence insufficiency. These findings were not documented in her hospital notes and are not pre-morbid, thus I believe she may benefit from further CNS workup. Please advise.  Thanks!   Louana Terrilyn Christians, PT, DPT 03/24/24 12:55 PM   Outpatient Rehab at [facility] [address] [place], [address] [place], [place] [address] Phone # [phone] Fax # [phone]

## 2024-03-24 NOTE — Telephone Encounter (Signed)
 Thank you so much for your detailed evaluation and reaching out.  Obviously I am not the expert in lacunar strokes, but it is my understanding that all of your findings today can be seen after a patient has a lacunar stroke.  That being said, I am happy to facilitate any additional work up while she is waiting to see Neurology. Given that she has already had an MRI and CTA do you have any recommendations of what else needs to be done?

## 2024-03-24 NOTE — Telephone Encounter (Signed)
 Hello and thank you for reaching out. 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 Even though not documented, I think this is part of her stroke. Her stroke work up is complete and she is on the appropriate medications. I would not re-scan patient. I will see her in 6 weeks in clinic.

## 2024-03-27 NOTE — Therapy (Signed)
 OUTPATIENT PHYSICAL THERAPY NEURO TREATMENT   Patient Name: Vanessa Christensen MRN: [id number] DOB:05/07/1951, 73 y.o., female Today's Date: 03/28/2024   PCP:    Mahlon Comer BRAVO, MD   REFERRING PROVIDER: Raenelle Donalda HERO, MD  END OF SESSION:  PT End of Session - 03/28/24 1012     Visit Number 2    Number of Visits 17    Date for Recertification  05/19/24    Authorization Type Medicare/BCBS Federal    PT Start Time [id number]    PT Stop Time 1010    PT Time Calculation (min) 36 min    Activity Tolerance Patient tolerated treatment well    Behavior During Therapy [facility] for tasks assessed/performed           Past Medical History:  Diagnosis Date   Anemia    hx of  iron deficient   Anxiety    pt denies   Arthritis    hands and feet   Cancer (HCC) 10/04/2006   breast    left   Cataracts, both eyes    Depression    Depression    Diverticulosis of colon    GERD (gastroesophageal reflux disease)    Glaucoma    Hyperlipidemia    Hypertension    no meds   Joint pain    Knee pain    Obesity    OSA on CPAP    Osteoarthritis    Sleep apnea    no CPAP- no longer needed d/t weight loss   Thyroid  activity decreased    Past Surgical History:  Procedure Laterality Date   BREAST SURGERY Bilateral    CATARACT EXTRACTION     COLONOSCOPY     GLAUCOMA REPAIR     MASTECTOMY, RADICAL Bilateral    neulasta induced sterile abscesses     THYROIDECTOMY, PARTIAL     TONSILLECTOMY     TOTAL KNEE ARTHROPLASTY Right    TOTAL KNEE ARTHROPLASTY Left 10/27/2022   Procedure: LEFT TOTAL KNEE ARTHROPLASTY;  Surgeon: Vernetta Lonni GRADE, MD;  Location: [facility];  Service: Orthopedics;  Laterality: Left;   TOTAL KNEE REVISION Right 05/07/2020   Procedure: RIGHT TOTAL KNEE REVISION ARTHROPLASTY;  Surgeon: Vernetta Lonni GRADE, MD;  Location: [facility];  Service: Orthopedics;  Laterality: Right;   Patient Active Problem List   Diagnosis Date Noted   CVA (cerebral vascular accident) (HCC)  03/14/2024   Cellulitis of right leg 12/21/2023   Onychomycosis 12/21/2023   Atherosclerosis of artery 03/12/2023   Adrenal nodule 11/16/2022   Thyroid  nodule 11/16/2022   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   Generalized obesity 10/05/2022   Memory loss 06/12/2022   Essential hypertension 04/13/2022   Obesity (BMI 30-39.9) 04/13/2022   Stress incontinence 11/08/2021   Status post revision of total replacement of right knee 05/07/2020   Failed total knee, right, 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/2020   History of total right knee replacement 01/28/2020   Vitamin D  deficiency 08/29/2018   Insulin  resistance 08/29/2018   Other specified glaucoma 08/14/2018   Incontinence of urine in female 11/07/2017   Vertigo 11/07/2017   Left knee pain 10/25/2016   Genetic testing 08/08/2016   Hypothyroidism 12/02/2015   Anxiety and depression 12/02/2015   OAB (overactive bladder) 12/02/2015   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 However husband notices that turns in the car still affect her.   Pt accompanied by: significant other  PERTINENT HISTORY: Anemia, anxiety, L breast CA s/p B mastectomy, depression, HLD, HTN, B TKA  PAIN:  Are you having pain? No  PRECAUTIONS: Fall  RED FLAGS: None   WEIGHT BEARING RESTRICTIONS: No  FALLS: Has patient fallen in last 6  months? Yes. Number of falls 1  LIVING ENVIRONMENT: Lives with: lives with their spouse Lives in: House/apartment Stairs: 2 step to enter with handrail; 2 story home with bedroom on 1st floor Has following equipment at home: Single point cane and Walker - 2 wheeled  PLOF: Independent  PATIENT GOALS: improve dizziness  OBJECTIVE:       TODAY'S TREATMENT: 03/28/24 Activity Comments  Sit>supine R upbeating torsional nystagmus lasting ~15 sec  R roll test Negative; C/o dizziness during roll  L roll test negative  Dizziness and imbalance upon sitting up    L loaded DH Brief c/o dizziness lasting ~10 sec; possible upbeating nystagmus  R loaded DH R upbeating torsional nystagmus lasting 15 sec  R epley  C/o dizziness in position 3; no dizziness sitting up  R  DH Appeared L beating nystagmus, then back to torsional  L roll test Nystagmus difficult to discern   R roll test Negative   R  DH Negative; C/o brief dizziness in getting into position and sitting up  Pt unsteady upon standing up. Ambulating to wait room with RW with husband and therapist for safety       PATIENT EDUCATION: Education details: edu on post-CRM expectations and continued to encourage use of walker for ambulation  Person educated: Patient and Spouse Education method: Explanation Education comprehension: verbalized understanding      Note: Objective measures were completed at Evaluation unless otherwise noted.  DIAGNOSTIC FINDINGS: 03/14/24 brain MRI: Acute 6 mm right splenium lacunar infarct. No acute hemorrhage or mass effect. 2. Underlying signal changes of advanced chronic small vessel disease in the bilateral white matter and deep gray nuclei.  COGNITION: Overall cognitive status: Within functional limits for tasks assessed; husband assists with subjective report    SENSATION: Pt reports slight N/T in R foot d/t neuropathy  COORDINATION: Alternating pronation/supination: slight dysmetria on  L Alternating toe tap: WNL Finger to nose: WNL  Vitals: 147/96 mmHg, 96% spO2, 80bpm     POSTURE: rounded shoulders and forward head  GAIT: Findings: Assistive device utilized:Single point cane, Level of assistance: Min A, and Comments: requires assistance for balance from husband   VESTIBULAR ASSESSMENT   GENERAL OBSERVATION: pt wears glasses but not sure what kind   OCULOMOTOR EXAM: Eye alignment: L eye elevated in orbit   Ocular ROM: limited R eye abduction   Spontaneous Nystagmus: absent   Gaze-Induced Nystagmus: absent   Smooth Pursuits: difficulty tracking R and c/o diplopia at 45 degrees gaze to each side   Saccades: disconjugate eye movement with R eye overshooting/missing target repeatedly and c/o diplopia with R gaze   Convergence/Divergence: 12 inches c/o diplopia cm    VESTIBULAR - OCULAR REFLEX:    Slow VOR: Normal   VOR Cancellation: Unable to Maintain Gaze; c/o diplopia   Head-Impulse Test: HIT Right: negative HIT Left: negative    POSITIONAL TESTING:   Right Sidelying: strong anxiety response and c/o dizziness with nystagmus but unable to identify d/t pt closing eyes Left Sidelying: negative                                                                                                                               TREATMENT DATE: 03/24/24    PATIENT EDUCATION: Education details: prognosis, POC, exam findings and need for MD f/u to address new oculomotor findings that were not documented in the hospital  Person educated: Patient and Spouse Education method: Explanation Education comprehension: verbalized understanding  HOME EXERCISE PROGRAM: Not yet initiated    GOALS: Goals reviewed with patient? Yes  SHORT TERM GOALS: Target date: 04/21/2024  Patient to be independent with initial HEP. Baseline: HEP initiated Goal status: IN PROGRESS    LONG TERM GOALS: Target date: 05/19/2024  Patient to be independent with advanced HEP. Baseline:  Not yet initiated  Goal status: IN PROGRESS  Patient to report 0/10 dizziness with standing vertical and horizontal VOR for 30 seconds. Baseline: Unable Goal status: IN PROGRESS  Patient will report 0/10 dizziness with bed mobility.  Baseline: Symptomatic  Goal status: IN PROGRESS  Patient to demonstrate ambulation for 333ft with LRAD and supervision for safety.  Baseline: min A d/t imbalance  Goal status: IN PROGRESS  Patient to score at least 15/24 on DGI in order to decrease risk of falls. Baseline: NT Goal status: IN PROGRESS   ASSESSMENT:  CLINICAL IMPRESSION:  Patient arrived to session with some improvement in steadiness, ambulating with cane into session. She reports reduced dizziness since last session. Positional testing revealed R posterior canalithiasis, treated with R epley. Patient with c/o dizziness in position 3, thus suspected canal conversion. However pt with undiscernible nystagmus only with L roll test and subsequent R DH was symptomatic but negative. also suspect L posterior canalithiasis. Will recheck next session. Patient was  escorted with husband and walker for safety d/t instability upon leaving.    OBJECTIVE IMPAIRMENTS: Abnormal gait, decreased activity tolerance, decreased balance, decreased coordination, difficulty walking, and dizziness.   ACTIVITY LIMITATIONS: carrying, lifting, bending, sitting, standing, squatting, sleeping, stairs, transfers, bed mobility, bathing, toileting, dressing, reach over head, hygiene/grooming, locomotion level, and caring for others  PARTICIPATION LIMITATIONS: meal prep, cleaning, laundry, driving, shopping, community activity, yard work, and church  PERSONAL FACTORS: Age, Behavior pattern, Past/current experiences, Time since onset of injury/illness/exacerbation, and 3+ comorbidities: Anemia, anxiety, L breast CA s/p B mastectomy, depression, HLD, HTN, B TKA are also affecting patient's functional outcome.   REHAB  POTENTIAL: Good  CLINICAL DECISION MAKING: Evolving/moderate complexity  EVALUATION COMPLEXITY: Moderate  PLAN:  PT FREQUENCY: 1-2x/week  PT DURATION: 8 weeks  PLANNED INTERVENTIONS: 97164- PT Re-evaluation, 97750- Physical Performance Testing, 97110-Therapeutic exercises, 97530- Therapeutic activity, 97112- Neuromuscular re-education, 97535- Self Care, 02859- Manual therapy, [phone]- Gait training, [phone]- Canalith repositioning, Patient/Family education, Balance training, Stair training, Taping, Vestibular training, Cryotherapy, and Moist heat  PLAN FOR NEXT SESSION: positional testing and treat; DGI, initiate HEP    Louana Terrilyn Christians, PT, DPT 03/28/24 10:12 AM  [facility] [address], [address] [place], [place] [address] Phone # [phone] Fax # [phone]

## 2024-03-28 ENCOUNTER — Encounter: Payer: Self-pay | Admitting: Physical Therapy

## 2024-03-28 ENCOUNTER — Ambulatory Visit: Admitting: Physical Therapy

## 2024-03-28 DIAGNOSIS — R2681 Unsteadiness on feet: Secondary | ICD-10-CM

## 2024-03-28 DIAGNOSIS — R29818 Other symptoms and signs involving the nervous system: Secondary | ICD-10-CM

## 2024-03-28 DIAGNOSIS — R42 Dizziness and giddiness: Secondary | ICD-10-CM | POA: Diagnosis not present

## 2024-03-31 ENCOUNTER — Ambulatory Visit: Admitting: Physical Therapy

## 2024-03-31 ENCOUNTER — Encounter: Payer: Self-pay | Admitting: Physical Therapy

## 2024-03-31 DIAGNOSIS — R2681 Unsteadiness on feet: Secondary | ICD-10-CM

## 2024-03-31 DIAGNOSIS — R42 Dizziness and giddiness: Secondary | ICD-10-CM | POA: Diagnosis not present

## 2024-03-31 DIAGNOSIS — R29818 Other symptoms and signs involving the nervous system: Secondary | ICD-10-CM | POA: Diagnosis not present

## 2024-03-31 NOTE — Therapy (Signed)
 OUTPATIENT PHYSICAL THERAPY NEURO TREATMENT   Patient Name: Vanessa Christensen MRN: [id number] DOB:June 22, 1950, 73 y.o., female Today's Date: 03/31/2024   PCP:    Mahlon Comer BRAVO, MD   REFERRING PROVIDER: Raenelle Donalda HERO, MD  END OF SESSION:  PT End of Session - 03/31/24 1147     Visit Number 3    Number of Visits 17    Date for Recertification  05/19/24    Authorization Type Medicare/BCBS Federal    PT Start Time 1106    PT Stop Time 1144    PT Time Calculation (min) 38 min    Activity Tolerance Patient tolerated treatment well    Behavior During Therapy [facility] for tasks assessed/performed            Past Medical History:  Diagnosis Date   Anemia    hx of  iron deficient   Anxiety    pt denies   Arthritis    hands and feet   Cancer (HCC) 10/04/2006   breast    left   Cataracts, both eyes    Depression    Depression    Diverticulosis of colon    GERD (gastroesophageal reflux disease)    Glaucoma    Hyperlipidemia    Hypertension    no meds   Joint pain    Knee pain    Obesity    OSA on CPAP    Osteoarthritis    Sleep apnea    no CPAP- no longer needed d/t weight loss   Thyroid  activity decreased    Past Surgical History:  Procedure Laterality Date   BREAST SURGERY Bilateral    CATARACT EXTRACTION     COLONOSCOPY     GLAUCOMA REPAIR     MASTECTOMY, RADICAL Bilateral    neulasta induced sterile abscesses     THYROIDECTOMY, PARTIAL     TONSILLECTOMY     TOTAL KNEE ARTHROPLASTY Right    TOTAL KNEE ARTHROPLASTY Left 10/27/2022   Procedure: LEFT TOTAL KNEE ARTHROPLASTY;  Surgeon: Vernetta Lonni GRADE, MD;  Location: [facility];  Service: Orthopedics;  Laterality: Left;   TOTAL KNEE REVISION Right 05/07/2020   Procedure: RIGHT TOTAL KNEE REVISION ARTHROPLASTY;  Surgeon: Vernetta Lonni GRADE, MD;  Location: [facility];  Service: Orthopedics;  Laterality: Right;   Patient Active Problem List   Diagnosis Date Noted   CVA (cerebral vascular accident)  (HCC) 03/14/2024   Cellulitis of right leg 12/21/2023   Onychomycosis 12/21/2023   Atherosclerosis of artery 03/12/2023   Adrenal nodule 11/16/2022   Thyroid  nodule 11/16/2022   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   Generalized obesity 10/05/2022   Memory loss 06/12/2022   Essential hypertension 04/13/2022   Obesity (BMI 30-39.9) 04/13/2022   Stress incontinence 11/08/2021   Status post revision of total replacement of right knee 05/07/2020   Failed total knee, right, 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/2020   History of total right knee replacement 01/28/2020   Vitamin D  deficiency 08/29/2018   Insulin  resistance 08/29/2018   Other specified glaucoma 08/14/2018   Incontinence of urine in female 11/07/2017   Vertigo 11/07/2017   Left knee pain 10/25/2016   Genetic testing 08/08/2016   Hypothyroidism 12/02/2015   Anxiety and depression 12/02/2015   OAB (overactive bladder) 12/02/2015   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/02/2015   BMI 37.0-37.9, adult 12/02/2015    ONSET DATE: 10/10/125  REFERRING DIAG: I63.9 (ICD-10-CM) - CVA (cerebral vascular accident) (HCC)  THERAPY DIAG:  Dizziness  and giddiness  Unsteadiness on feet  Rationale for Evaluation and Treatment: Rehabilitation  SUBJECTIVE:                                                                                                                                                                                             SUBJECTIVE STATEMENT: Feeling a little better, getting up in the bed is a little better.  Right now-feeling is that things are moving, but farther away. 5-6/10 symptoms today.   Pt accompanied by: significant other  PERTINENT HISTORY: Anemia, anxiety, L breast CA s/p B mastectomy, depression, HLD, HTN, B TKA  PAIN:  Are you having pain? No  PRECAUTIONS: Fall  RED FLAGS: None   WEIGHT BEARING RESTRICTIONS: No  FALLS: Has patient fallen in last 6  months? Yes. Number of falls 1  LIVING ENVIRONMENT: Lives with: lives with their spouse Lives in: House/apartment Stairs: 2 step to enter with handrail; 2 story home with bedroom on 1st floor Has following equipment at home: Single point cane and Walker - 2 wheeled  PLOF: Independent  PATIENT GOALS: improve dizziness  OBJECTIVE:     TODAY'S TREATMENT: 03/31/2024 Activity Comments  R loaded DH Closed eyes initially, felt spinning sensation briefly, then double vision  R Roll  No nystagmus, no dizziness  L roll No nystagmus, no dizziness  L loaded DH Pt reports dizziness, > 1 minute, initially noted nystagmus, but not sure how long, as pt is moving eyes throughout time; no double vision  L Epley Whole room spinning motion briefly 5 seconds in position 2; dizzy in position 3; no dizziness upon sitting EOM  L loaded DH Reports dizziness, lasts 40 seconds in position 1, then brief dizziness position 2, no dizziness in 3 and 4 sitting EOM    Rates dizziness as 3-4/10 at end of session            PATIENT EDUCATION: Education details: edu on post-CRM expectations and continued to encourage use of walker for ambulation  Person educated: Patient and Spouse Education method: Explanation Education comprehension: verbalized understanding      Note: Objective measures were completed at Evaluation unless otherwise noted.  DIAGNOSTIC FINDINGS: 03/14/24 brain MRI: Acute 6 mm right splenium lacunar infarct. No acute hemorrhage or mass effect. 2. Underlying signal changes of advanced chronic small vessel disease in the bilateral white matter and deep gray nuclei.  COGNITION: Overall cognitive status: Within functional limits for tasks assessed; husband assists with subjective report    SENSATION: Pt reports slight N/T in R foot d/t neuropathy   COORDINATION: Alternating pronation/supination: slight dysmetria on L Alternating toe tap:  WNL Finger to nose: WNL  Vitals: 147/96  mmHg, 96% spO2, 80bpm     POSTURE: rounded shoulders and forward head  GAIT: Findings: Assistive device utilized:Single point cane, Level of assistance: Min A, and Comments: requires assistance for balance from husband   VESTIBULAR ASSESSMENT   GENERAL OBSERVATION: pt wears glasses but not sure what kind   OCULOMOTOR EXAM: Eye alignment: L eye elevated in orbit   Ocular ROM: limited R eye abduction   Spontaneous Nystagmus: absent   Gaze-Induced Nystagmus: absent   Smooth Pursuits: difficulty tracking R and c/o diplopia at 45 degrees gaze to each side   Saccades: disconjugate eye movement with R eye overshooting/missing target repeatedly and c/o diplopia with R gaze   Convergence/Divergence: 12 inches c/o diplopia cm    VESTIBULAR - OCULAR REFLEX:    Slow VOR: Normal   VOR Cancellation: Unable to Maintain Gaze; c/o diplopia   Head-Impulse Test: HIT Right: negative HIT Left: negative    POSITIONAL TESTING:   Right Sidelying: strong anxiety response and c/o dizziness with nystagmus but unable to identify d/t pt closing eyes Left Sidelying: negative                                                                                                                               TREATMENT DATE: 03/24/24    PATIENT EDUCATION: Education details: prognosis, POC, exam findings and need for MD f/u to address new oculomotor findings that were not documented in the hospital  Person educated: Patient and Spouse Education method: Explanation Education comprehension: verbalized understanding  HOME EXERCISE PROGRAM: Not yet initiated    GOALS: Goals reviewed with patient? Yes  SHORT TERM GOALS: Target date: 04/21/2024  Patient to be independent with initial HEP. Baseline: HEP initiated Goal status: IN PROGRESS    LONG TERM GOALS: Target date: 05/19/2024  Patient to be independent with advanced HEP. Baseline: Not yet initiated  Goal status: IN PROGRESS  Patient to report  0/10 dizziness with standing vertical and horizontal VOR for 30 seconds. Baseline: Unable Goal status: IN PROGRESS  Patient will report 0/10 dizziness with bed mobility.  Baseline: Symptomatic  Goal status: IN PROGRESS  Patient to demonstrate ambulation for 335ft with LRAD and supervision for safety.  Baseline: min A d/t imbalance  Goal status: IN PROGRESS  Patient to score at least 15/24 on DGI in order to decrease risk of falls. Baseline: NT Goal status: IN PROGRESS   ASSESSMENT:  CLINICAL IMPRESSION: Patient arrived to session with some improvement in dizziness, reporting feeling better several days post-treatment in last session.   She reports reduced dizziness since last session. Looked at positional testing today, with R DH, pt does not have discernable nystagmus and reports waviness/double vision; she has >1 minute of dizziness in L DH position (again, not sure about nystagmus).  Based on symptoms, treated x 2 with L Epley maneuver and pt reports slight improvement in dizziness by end of session.  She continues to use her RW in and out of session with husband supervision.  She will continue to benefit from skilled PT to assess/address dizziness and balance for overall improved functional mobility.  OBJECTIVE IMPAIRMENTS: Abnormal gait, decreased activity tolerance, decreased balance, decreased coordination, difficulty walking, and dizziness.   ACTIVITY LIMITATIONS: carrying, lifting, bending, sitting, standing, squatting, sleeping, stairs, transfers, bed mobility, bathing, toileting, dressing, reach over head, hygiene/grooming, locomotion level, and caring for others  PARTICIPATION LIMITATIONS: meal prep, cleaning, laundry, driving, shopping, community activity, yard work, and church  PERSONAL FACTORS: Age, Behavior pattern, Past/current experiences, Time since onset of injury/illness/exacerbation, and 3+ comorbidities: Anemia, anxiety, L breast CA s/p B mastectomy, depression,  HLD, HTN, B TKA are also affecting patient's functional outcome.   REHAB POTENTIAL: Good  CLINICAL DECISION MAKING: Evolving/moderate complexity  EVALUATION COMPLEXITY: Moderate  PLAN:  PT FREQUENCY: 1-2x/week  PT DURATION: 8 weeks  PLANNED INTERVENTIONS: 97164- PT Re-evaluation, 97750- Physical Performance Testing, 97110-Therapeutic exercises, 97530- Therapeutic activity, 97112- Neuromuscular re-education, 97535- Self Care, 02859- Manual therapy, [phone]- Gait training, [phone]- Canalith repositioning, Patient/Family education, Balance training, Stair training, Taping, Vestibular training, Cryotherapy, and Moist heat  PLAN FOR NEXT SESSION: Recheck positional testing and treat; DGI, initiate HEP    Greig Anon, PT 03/31/24 11:48 AM Phone: [phone] Fax: [phone]  [facility] at [facility] [address], [address] [place], [place] [address] Phone # [phone] Fax # [phone]

## 2024-04-02 ENCOUNTER — Ambulatory Visit (INDEPENDENT_AMBULATORY_CARE_PROVIDER_SITE_OTHER): Admitting: Bariatrics

## 2024-04-02 ENCOUNTER — Encounter: Payer: Self-pay | Admitting: Bariatrics

## 2024-04-02 VITALS — BP 146/84 | HR 88 | Temp 97.5°F | Ht 62.0 in | Wt 194.0 lb

## 2024-04-02 DIAGNOSIS — E669 Obesity, unspecified: Secondary | ICD-10-CM

## 2024-04-02 DIAGNOSIS — E66812 Obesity, class 2: Secondary | ICD-10-CM

## 2024-04-02 DIAGNOSIS — Z6835 Body mass index (BMI) 35.0-35.9, adult: Secondary | ICD-10-CM | POA: Diagnosis not present

## 2024-04-02 DIAGNOSIS — R632 Polyphagia: Secondary | ICD-10-CM

## 2024-04-02 DIAGNOSIS — E039 Hypothyroidism, unspecified: Secondary | ICD-10-CM

## 2024-04-02 DIAGNOSIS — E038 Other specified hypothyroidism: Secondary | ICD-10-CM

## 2024-04-02 MED ORDER — WEGOVY 1 MG/0.5ML ~~LOC~~ SOAJ: 1.0000 mg | SUBCUTANEOUS | 0 refills | Status: DC

## 2024-04-02 NOTE — Progress Notes (Signed)
 WEIGHT SUMMARY AND BIOMETRICS  Weight Lost Since Last Visit: 3lb  Weight Gained Since Last Visit: 0   Vitals Temp: (!) 97.5 F (36.4 C) BP: (!) 146/84 (Cant do BP in left arm) Pulse Rate: 88 SpO2: 99 %   Anthropometric Measurements Height: 5' 2 (1.575 m) Weight: 194 lb (88 kg) BMI (Calculated): 35.47 Weight at Last Visit: 197lb Weight Lost Since Last Visit: 3lb Weight Gained Since Last Visit: 0 Starting Weight: 241lb Total Weight Loss (lbs): 47 lb (21.3 kg)   Body Composition  Body Fat %: 45.5 % Fat Mass (lbs): 88.6 lbs Muscle Mass (lbs): 100.6 lbs Total Body Water  (lbs): 72 lbs Visceral Fat Rating : 15   Other Clinical Data Fasting: no Labs: no Today's Visit #: 51 Starting Date: 08/13/18    OBESITY Vanessa Christensen is here to discuss her progress with her obesity treatment plan along with follow-up of her obesity related diagnoses.    Nutrition Plan: the Category 3 plan - 50% adherence.  Current exercise: physical therapy  Interim History:  She is down 3 lbs since her last visit. She is using a walker.  Her spouse is now cooking on a regular basis.  Protein intake is as prescribed, Meeting calorie goals., and Water  intake is adequate.   Pharmacotherapy: Vanessa Christensen is on Wegovy  1.0 mg SQ weekly Adverse side effects: None Hunger is moderately controlled.  Cravings are moderately controlled.  Assessment/Plan:   Vanessa Christensen endorses excessive hunger.  Medication(s): Wegovy  Effects of medication (appetite):  moderately controlled. Cravings are moderately controlled.   Plan: Medication(s): Wegovy  1.0 mg SQ weekly Will increase water , protein and fiber to help assuage hunger.  Will minimize foods that have a high glucose index/load to minimize reactive hypoglycemia.   Hypothyroidism Stable.  Does not report symptoms associated with  uncontrolled hypothyroidism. Medication(s): Levothyroxine  75 mcg daily. Lab Results  Component Value Date   TSH 1.12 01/15/2023    Plan: Continue levothyroxine  at current dose. Counseling: The correct way to take levothyroxine  is fasting, with water , separated by at least 30 minutes from breakfast, and separated by more than 4 hours from calcium , iron, multivitamins, acid reflux medications (PPIs).  Will continue to do meal planning and cook more at home.  Will resume exercise when her vertigo resolves.     Generalized Obesity: Current BMI BMI (Calculated): 35.47   Pharmacotherapy Plan Continue and refill  Wegovy  1.0 mg SQ weekly  Vanessa Christensen is currently in the action stage of change. As such, her goal is to continue with weight loss efforts.  She has agreed to the Category 3 plan.  Exercise goals: Older adults should determine their level of effort for physical activity relative to their level of fitness.  She cannot exercise due to her vertigo.   Behavioral modification strategies: increasing lean protein intake, no meal skipping, decrease eating out, increase water  intake, better snacking  choices, planning for success, avoiding temptations, and keep healthy foods in the home.  Sister has agreed to follow-up with our clinic in 4 weeks.     Objective:   VITALS: Per patient if applicable, see vitals. GENERAL: Alert and in no acute distress. CARDIOPULMONARY: No increased WOB. Speaking in clear sentences.  PSYCH: Pleasant and cooperative. Speech normal rate and rhythm. Affect is appropriate. Insight and judgement are appropriate. Attention is focused, linear, and appropriate.  NEURO: Oriented as arrived to appointment on time with no prompting.   Attestation Statements:   This was prepared with the assistance of Engineer, Civil (consulting).  Occasional wrong-word or sound-a-like substitutions may have occurred due to the inherent limitations of voice recognition   Clayborne Daring,  DO

## 2024-04-03 ENCOUNTER — Ambulatory Visit: Admitting: Physical Therapy

## 2024-04-03 ENCOUNTER — Ambulatory Visit: Admitting: Bariatrics

## 2024-04-04 ENCOUNTER — Ambulatory Visit: Admitting: Physical Therapy

## 2024-04-07 ENCOUNTER — Encounter: Payer: Self-pay | Admitting: Radiology

## 2024-04-09 ENCOUNTER — Encounter: Payer: Self-pay | Admitting: Physician Assistant

## 2024-04-09 ENCOUNTER — Ambulatory Visit (INDEPENDENT_AMBULATORY_CARE_PROVIDER_SITE_OTHER): Admitting: Physician Assistant

## 2024-04-09 VITALS — BP 123/82

## 2024-04-09 DIAGNOSIS — W908XXA Exposure to other nonionizing radiation, initial encounter: Secondary | ICD-10-CM

## 2024-04-09 DIAGNOSIS — H61002 Unspecified perichondritis of left external ear: Secondary | ICD-10-CM

## 2024-04-09 DIAGNOSIS — L814 Other melanin hyperpigmentation: Secondary | ICD-10-CM

## 2024-04-09 DIAGNOSIS — L821 Other seborrheic keratosis: Secondary | ICD-10-CM | POA: Diagnosis not present

## 2024-04-09 DIAGNOSIS — L578 Other skin changes due to chronic exposure to nonionizing radiation: Secondary | ICD-10-CM | POA: Diagnosis not present

## 2024-04-09 DIAGNOSIS — Z1283 Encounter for screening for malignant neoplasm of skin: Secondary | ICD-10-CM | POA: Diagnosis not present

## 2024-04-09 DIAGNOSIS — Z853 Personal history of malignant neoplasm of breast: Secondary | ICD-10-CM

## 2024-04-09 DIAGNOSIS — D1801 Hemangioma of skin and subcutaneous tissue: Secondary | ICD-10-CM | POA: Diagnosis not present

## 2024-04-09 DIAGNOSIS — D229 Melanocytic nevi, unspecified: Secondary | ICD-10-CM

## 2024-04-09 NOTE — Progress Notes (Unsigned)
   Follow-Up Visit   Subjective  Vanessa Christensen is a 73 y.o. female who presents for the following: Skin Cancer Screening and Full Body Skin Exam - No history of skin cancer. She had a mini stroke about 4 weeks ago.  She has a spot of her left ear. Her hair dresser has hit the same spot twice and now it is sore and she can not sleep on that side.  The patient presents for Total-Body Skin Exam (TBSE) for skin cancer screening and mole check. The patient has spots, moles and lesions to be evaluated, some may be new or changing and the patient may have concern these could be cancer.    The following portions of the chart were reviewed this encounter and updated as appropriate: medications, allergies, medical history  Review of Systems:  No other skin or systemic complaints except as noted in HPI or Assessment and Plan.  Objective  Well appearing patient in no apparent distress; mood and affect are within normal limits.  A full examination was performed including scalp, head, eyes, ears, nose, lips, neck, chest, axillae, abdomen, back, buttocks, bilateral upper extremities, bilateral lower extremities, hands, feet, fingers, toes, fingernails, and toenails. All findings within normal limits unless otherwise noted below.   Relevant physical exam findings are noted in the Assessment and Plan.    Assessment & Plan   SKIN CANCER SCREENING PERFORMED TODAY.  ACTINIC DAMAGE - Chronic condition, secondary to cumulative UV/sun exposure - diffuse scaly erythematous macules with underlying dyspigmentation - Recommend daily broad spectrum sunscreen SPF 30+ to sun-exposed areas, reapply every 2 hours as needed.  - Staying in the shade or wearing long sleeves, sun glasses (UVA+UVB protection) and wide brim hats (4-inch brim around the entire circumference of the hat) are also recommended for sun protection.  - Call for new or changing lesions.  LENTIGINES, SEBORRHEIC KERATOSES, HEMANGIOMAS - Benign  normal skin lesions - Benign-appearing - Call for any changes  MELANOCYTIC NEVI - Tan-brown and/or pink-flesh-colored symmetric macules and papules - Benign appearing on exam today - Observation - Call clinic for new or changing moles - Recommend daily use of broad spectrum spf 30+ sunscreen to sun-exposed areas.   HISTORY OF BREAST (LEFT) CANCER TREATED WITH BILATERAL MASTECTOMY  CHONDRODERMATITIS NODULARIS HELICIS Exam: Scaly pink papule or plaque overlying prominent cartilage at the left ear  Chondrodermatitis Nodularis Chronica Helicis (CNCH or CNH) is a common, benign inflammatory condition of the ear cartilage and overlying skin associated with very sensitive tender papule(s).  Trauma or pressure from sleeping on the ear or from cell phone use and sun damage may be exacerbating factors.  Treatment may include using a C-shaped airplane neck pillow for sleeping on the side of the head so no pressure is on the ear.  Other treatments include topical or intralesional steroids; liquid nitrogen or laser destruction; shave removal or excision.  The condition can be difficult to treat and persist or recur despite treatment.  Treatment Plan: Recommend a CNCH pillow to sleep.     Return if symptoms worsen or fail to improve.  I, Roseline Hutchinson, CMA, am acting as scribe for Jeily Guthridge K, PA-C .   Documentation: I have reviewed the above documentation for accuracy and completeness, and I agree with the above.  Lanea Vankirk K, PA-C

## 2024-04-09 NOTE — Patient Instructions (Signed)

## 2024-04-09 NOTE — Therapy (Signed)
 OUTPATIENT PHYSICAL THERAPY NEURO TREATMENT   Patient Name: Vanessa Christensen MRN: [id number] DOB:1951/02/07, 73 y.o., female Today's Date: 04/10/2024   PCP:    Mahlon Comer BRAVO, MD   REFERRING PROVIDER: Raenelle Donalda HERO, MD  END OF SESSION:  PT End of Session - 04/10/24 1403     Visit Number 4    Number of Visits 17    Date for Recertification  05/19/24    Authorization Type Medicare/BCBS Federal    PT Start Time 1318    PT Stop Time 1402    PT Time Calculation (min) 44 min    Equipment Utilized During Treatment Gait belt    Activity Tolerance Patient tolerated treatment well    Behavior During Therapy WFL for tasks assessed/performed             Past Medical History:  Diagnosis Date   Anemia    hx of  iron deficient   Anxiety    pt denies   Arthritis    hands and feet   Cancer (HCC) 10/04/2006   breast    left   Cataracts, both eyes    Depression    Depression    Diverticulosis of colon    GERD (gastroesophageal reflux disease)    Glaucoma    Hyperlipidemia    Hypertension    no meds   Joint pain    Knee pain    Obesity    OSA on CPAP    Osteoarthritis    Sleep apnea    no CPAP- no longer needed d/t weight loss   Thyroid  activity decreased    Past Surgical History:  Procedure Laterality Date   BREAST SURGERY Bilateral    CATARACT EXTRACTION     COLONOSCOPY     GLAUCOMA REPAIR     MASTECTOMY, RADICAL Bilateral    neulasta induced sterile abscesses     THYROIDECTOMY, PARTIAL     TONSILLECTOMY     TOTAL KNEE ARTHROPLASTY Right    TOTAL KNEE ARTHROPLASTY Left 10/27/2022   Procedure: LEFT TOTAL KNEE ARTHROPLASTY;  Surgeon: Vernetta Lonni GRADE, MD;  Location: [facility];  Service: Orthopedics;  Laterality: Left;   TOTAL KNEE REVISION Right 05/07/2020   Procedure: RIGHT TOTAL KNEE REVISION ARTHROPLASTY;  Surgeon: Vernetta Lonni GRADE, MD;  Location: [facility];  Service: Orthopedics;  Laterality: Right;   Patient Active Problem List   Diagnosis  Date Noted   CVA (cerebral vascular accident) (HCC) 03/14/2024   Cellulitis of right leg 12/21/2023   Onychomycosis 12/21/2023   Atherosclerosis of artery 03/12/2023   Adrenal nodule 11/16/2022   Thyroid  nodule 11/16/2022   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   Generalized obesity 10/05/2022   Memory loss 06/12/2022   Essential hypertension 04/13/2022   Obesity (BMI 30-39.9) 04/13/2022   Stress incontinence 11/08/2021   Status post revision of total replacement of right knee 05/07/2020   Failed total knee, right, 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/2020   History of total right knee replacement 01/28/2020   Vitamin D  deficiency 08/29/2018   Insulin  resistance 08/29/2018   Other specified glaucoma 08/14/2018   Incontinence of urine in female 11/07/2017   Vertigo 11/07/2017   Left knee pain 10/25/2016   Genetic testing 08/08/2016   Hypothyroidism 12/02/2015   Anxiety and depression 12/02/2015   OAB (overactive bladder) 12/02/2015   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/02/2015   BMI 37.0-37.9, adult 12/02/2015    ONSET DATE: 10/10/125  REFERRING DIAG: I63.9 (ICD-10-CM) -  CVA (cerebral vascular accident) (HCC)  THERAPY DIAG:  Dizziness and giddiness  Unsteadiness on feet  Other symptoms and signs involving the nervous system  Rationale for Evaluation and Treatment: Rehabilitation  SUBJECTIVE:                                                                                                                                                                                             SUBJECTIVE STATEMENT: Some days, somewhat good, other days horrible. Taking pills for dizziness and anxiety.   Pt accompanied by: significant other  PERTINENT HISTORY: Anemia, anxiety, L breast CA s/p B mastectomy, depression, HLD, HTN, B TKA  PAIN:  Are you having pain? No  PRECAUTIONS: Fall  RED FLAGS: None   WEIGHT BEARING RESTRICTIONS:  No  FALLS: Has patient fallen in last 6 months? Yes. Number of falls 1  LIVING ENVIRONMENT: Lives with: lives with their spouse Lives in: House/apartment Stairs: 2 step to enter with handrail; 2 story home with bedroom on 1st floor Has following equipment at home: Single point cane and Walker - 2 wheeled  PLOF: Independent  PATIENT GOALS: improve dizziness  OBJECTIVE:     TODAY'S TREATMENT: 04/10/24 Activity Comments  Berg 34/56  L loaded DH Possible R upbeating torsional nystagmus ~10 sec followed by slow persisting R beating nystagmus  R loaded DH C/o brief dizziness followed by R upbeating torsional nystagmus lasting ~30 sec  R Epley C/o dizziness in position 3; tolerated well  R DH  C/o brief dizziness followed by R upbeating torsional nystagmus lasting ~30 sec        [facility] PT Assessment - 04/10/24 0001       Standardized Balance Assessment   Standardized Balance Assessment Berg Balance Test      Berg Balance Test   Sit to Stand Able to stand without using hands and stabilize independently    Standing Unsupported Able to stand safely 2 minutes    Sitting with Back Unsupported but Feet Supported on Floor or Stool Able to sit safely and securely 2 minutes    Stand to Sit Sits safely with minimal use of hands    Transfers Able to transfer safely, definite need of hands    Standing Unsupported with Eyes Closed Able to stand 10 seconds with supervision    Standing Unsupported with Feet Together Able to place feet together independently and stand for 1 minute with supervision    From Standing, Reach Forward with Outstretched Arm Reaches forward but needs supervision    From Standing Position, Pick up Object from Floor Able to pick up shoe, needs supervision  From Standing Position, Turn to Look Behind Over each Shoulder Needs supervision when turning    Turn 360 Degrees Needs close supervision or verbal cueing    Standing Unsupported, Alternately Place Feet on Step/Stool  Able to complete >2 steps/needs minimal assist   8 reps with walker   Standing Unsupported, One Foot in Front Able to take small step independently and hold 30 seconds    Standing on One Leg Unable to try or needs assist to prevent fall    Total Score 34          HOME EXERCISE PROGRAM Last updated: 04/10/24 Access Code: [email or web address] URL: [email or web address] Date: 04/10/2024 Prepared by: [facility]  Program Notes perform with husband's supervision for safety  Exercises - Supine to Left Sidelying Vestibular Habituation  - 1 x daily - 5 x weekly - 2 sets - 3-5 reps - Supine to Right Sidelying Vestibular Habituation  - 1 x daily - 5 x weekly - 2 sets - 3-5 reps - Seated Head Nods Vestibular Habituation  - 1 x daily - 5 x weekly - 2 sets - 30 sec hold   PATIENT EDUCATION: Education details: edu on benefits of OT for visual changes- pt in agreement; exam findings and recommendation to continue using RW, HEP with cues for safety Person educated: Patient and Spouse Education method: Explanation, Demonstration, Tactile cues, Verbal cues, and Handouts Education comprehension: verbalized understanding and returned demonstration      Note: Objective measures were completed at Evaluation unless otherwise noted.  DIAGNOSTIC FINDINGS: 03/14/24 brain MRI: Acute 6 mm right splenium lacunar infarct. No acute hemorrhage or mass effect. 2. Underlying signal changes of advanced chronic small vessel disease in the bilateral white matter and deep gray nuclei.  COGNITION: Overall cognitive status: Within functional limits for tasks assessed; husband assists with subjective report    SENSATION: Pt reports slight N/T in R foot d/t neuropathy   COORDINATION: Alternating pronation/supination: slight dysmetria on L Alternating toe tap: WNL Finger to nose: WNL  Vitals: 147/96 mmHg, 96% spO2, 80bpm     POSTURE: rounded shoulders and forward  head  GAIT: Findings: Assistive device utilized:Single point cane, Level of assistance: Min A, and Comments: requires assistance for balance from husband   VESTIBULAR ASSESSMENT   GENERAL OBSERVATION: pt wears glasses but not sure what kind   OCULOMOTOR EXAM: Eye alignment: L eye elevated in orbit   Ocular ROM: limited R eye abduction   Spontaneous Nystagmus: absent   Gaze-Induced Nystagmus: absent   Smooth Pursuits: difficulty tracking R and c/o diplopia at 45 degrees gaze to each side   Saccades: disconjugate eye movement with R eye overshooting/missing target repeatedly and c/o diplopia with R gaze   Convergence/Divergence: 12 inches c/o diplopia cm    VESTIBULAR - OCULAR REFLEX:    Slow VOR: Normal   VOR Cancellation: Unable to Maintain Gaze; c/o diplopia   Head-Impulse Test: HIT Right: negative HIT Left: negative    POSITIONAL TESTING:   Right Sidelying: strong anxiety response and c/o dizziness with nystagmus but unable to identify d/t pt closing eyes Left Sidelying: negative  TREATMENT DATE: 03/24/24    PATIENT EDUCATION: Education details: prognosis, POC, exam findings and need for MD f/u to address new oculomotor findings that were not documented in the hospital  Person educated: Patient and Spouse Education method: Explanation Education comprehension: verbalized understanding  HOME EXERCISE PROGRAM: Not yet initiated    GOALS: Goals reviewed with patient? Yes  SHORT TERM GOALS: Target date: 04/21/2024  Patient to be independent with initial HEP. Baseline: HEP initiated Goal status: IN PROGRESS    LONG TERM GOALS: Target date: 05/19/2024  Patient to be independent with advanced HEP. Baseline: Not yet initiated  Goal status: IN PROGRESS  Patient to report 0/10 dizziness with standing vertical and horizontal VOR for 30  seconds. Baseline: Unable Goal status: IN PROGRESS  Patient will report 0/10 dizziness with bed mobility.  Baseline: Symptomatic  Goal status: IN PROGRESS  Patient to demonstrate ambulation for 322ft with LRAD and supervision for safety.  Baseline: min A d/t imbalance  Goal status: IN PROGRESS  Patient to score at least 15/24 on DGI in order to decrease risk of falls. Baseline: deferred d/t safety 04/10/24 Goal status: IN PROGRESS  Patient to score at least 40/56 on Berg in order to decrease risk of falls. Baseline: 34/56 04/10/24 Goal status: IN PROGRESS   ASSESSMENT:  CLINICAL IMPRESSION: Patient arrived to session with report of fluctuating dizziness. Balance testing revealed 34/56 on Berg, indicating increased risk of falls. Positional testing revealed improved L BPPV, worse on the R today which was treated with R Epley without improvement. Initiated HEP with habituation with husband's support- pt reported understanding and without complaints upon leaving.   OBJECTIVE IMPAIRMENTS: Abnormal gait, decreased activity tolerance, decreased balance, decreased coordination, difficulty walking, and dizziness.   ACTIVITY LIMITATIONS: carrying, lifting, bending, sitting, standing, squatting, sleeping, stairs, transfers, bed mobility, bathing, toileting, dressing, reach over head, hygiene/grooming, locomotion level, and caring for others  PARTICIPATION LIMITATIONS: meal prep, cleaning, laundry, driving, shopping, community activity, yard work, and church  PERSONAL FACTORS: Age, Behavior pattern, Past/current experiences, Time since onset of injury/illness/exacerbation, and 3+ comorbidities: Anemia, anxiety, L breast CA s/p B mastectomy, depression, HLD, HTN, B TKA are also affecting patient's functional outcome.   REHAB POTENTIAL: Good  CLINICAL DECISION MAKING: Evolving/moderate complexity  EVALUATION COMPLEXITY: Moderate  PLAN:  PT FREQUENCY: 1-2x/week  PT DURATION: 8  weeks  PLANNED INTERVENTIONS: 97164- PT Re-evaluation, 97750- Physical Performance Testing, 97110-Therapeutic exercises, 97530- Therapeutic activity, 97112- Neuromuscular re-education, 97535- Self Care, [id number]- Manual therapy, [phone]- Gait training, [phone]- Canalith repositioning, Patient/Family education, Balance training, Stair training, Taping, Vestibular training, Cryotherapy, and Moist heat  PLAN FOR NEXT SESSION: Recheck positional testing and treat; review HEP  Louana Terrilyn Christians, PT, DPT 04/10/24 2:06 PM  [facility] [address], [address] [place], [place] [address] Phone # [phone] Fax # [phone]

## 2024-04-10 ENCOUNTER — Telehealth: Payer: Self-pay | Admitting: Physical Therapy

## 2024-04-10 ENCOUNTER — Ambulatory Visit: Attending: Internal Medicine | Admitting: Physical Therapy

## 2024-04-10 ENCOUNTER — Encounter: Payer: Self-pay | Admitting: Physical Therapy

## 2024-04-10 DIAGNOSIS — R278 Other lack of coordination: Secondary | ICD-10-CM | POA: Insufficient documentation

## 2024-04-10 DIAGNOSIS — R42 Dizziness and giddiness: Secondary | ICD-10-CM | POA: Diagnosis not present

## 2024-04-10 DIAGNOSIS — R29818 Other symptoms and signs involving the nervous system: Secondary | ICD-10-CM | POA: Insufficient documentation

## 2024-04-10 DIAGNOSIS — R41842 Visuospatial deficit: Secondary | ICD-10-CM | POA: Insufficient documentation

## 2024-04-10 DIAGNOSIS — R262 Difficulty in walking, not elsewhere classified: Secondary | ICD-10-CM | POA: Diagnosis not present

## 2024-04-10 DIAGNOSIS — R2681 Unsteadiness on feet: Secondary | ICD-10-CM | POA: Insufficient documentation

## 2024-04-10 NOTE — Telephone Encounter (Signed)
 Dr. Mahlon,  Vanessa Christensen was evaluated by PT s/p CVA.  The patient would benefit from OT evaluation for disconjugate gaze.   If you agree, please place an order in [facility] in [facility] or fax the order to [phone].  Thank you,  Louana Terrilyn Christians, PT, DPT 04/10/24 1:25 PM  [facility], [facility] [address] [place], [place]  [address] Phone:  [phone] Fax:  [phone]

## 2024-04-11 NOTE — Addendum Note (Signed)
 Addended by: ORMAN AMERICA on: 04/11/2024 07:08 PM   Modules accepted: Level of Service

## 2024-04-14 ENCOUNTER — Other Ambulatory Visit: Payer: Self-pay | Admitting: Family

## 2024-04-14 DIAGNOSIS — H519 Unspecified disorder of binocular movement: Secondary | ICD-10-CM

## 2024-04-15 ENCOUNTER — Ambulatory Visit

## 2024-04-15 DIAGNOSIS — R262 Difficulty in walking, not elsewhere classified: Secondary | ICD-10-CM | POA: Diagnosis not present

## 2024-04-15 DIAGNOSIS — N3946 Mixed incontinence: Secondary | ICD-10-CM | POA: Diagnosis not present

## 2024-04-15 DIAGNOSIS — R42 Dizziness and giddiness: Secondary | ICD-10-CM

## 2024-04-15 DIAGNOSIS — R2681 Unsteadiness on feet: Secondary | ICD-10-CM | POA: Diagnosis not present

## 2024-04-15 DIAGNOSIS — R29818 Other symptoms and signs involving the nervous system: Secondary | ICD-10-CM

## 2024-04-15 DIAGNOSIS — R41842 Visuospatial deficit: Secondary | ICD-10-CM | POA: Diagnosis not present

## 2024-04-15 DIAGNOSIS — R278 Other lack of coordination: Secondary | ICD-10-CM | POA: Diagnosis not present

## 2024-04-15 NOTE — Therapy (Signed)
 OUTPATIENT PHYSICAL THERAPY NEURO TREATMENT   Patient Name: Vanessa Christensen MRN: [id number] DOB:Jun 15, 1950, 73 y.o., female Today's Date: 04/15/2024   PCP:    Mahlon Comer BRAVO, MD   REFERRING PROVIDER: Raenelle Donalda HERO, MD  END OF SESSION:  PT End of Session - 04/15/24 1104     Visit Number 5    Number of Visits 17    Date for Recertification  05/19/24    Authorization Type Medicare/BCBS Federal    PT Start Time 1104    PT Stop Time 1145    PT Time Calculation (min) 41 min    Equipment Utilized During Treatment Gait belt    Activity Tolerance Patient tolerated treatment well    Behavior During Therapy WFL for tasks assessed/performed             Past Medical History:  Diagnosis Date   Anemia    hx of  iron deficient   Anxiety    pt denies   Arthritis    hands and feet   Cancer (HCC) 10/04/2006   breast    left   Cataracts, both eyes    Depression    Depression    Diverticulosis of colon    GERD (gastroesophageal reflux disease)    Glaucoma    Hyperlipidemia    Hypertension    no meds   Joint pain    Knee pain    Obesity    OSA on CPAP    Osteoarthritis    Sleep apnea    no CPAP- no longer needed d/t weight loss   Thyroid  activity decreased    Past Surgical History:  Procedure Laterality Date   BREAST SURGERY Bilateral    CATARACT EXTRACTION     COLONOSCOPY     GLAUCOMA REPAIR     MASTECTOMY, RADICAL Bilateral    neulasta induced sterile abscesses     THYROIDECTOMY, PARTIAL     TONSILLECTOMY     TOTAL KNEE ARTHROPLASTY Right    TOTAL KNEE ARTHROPLASTY Left 10/27/2022   Procedure: LEFT TOTAL KNEE ARTHROPLASTY;  Surgeon: Vernetta Lonni GRADE, MD;  Location: [facility];  Service: Orthopedics;  Laterality: Left;   TOTAL KNEE REVISION Right 05/07/2020   Procedure: RIGHT TOTAL KNEE REVISION ARTHROPLASTY;  Surgeon: Vernetta Lonni GRADE, MD;  Location: [facility];  Service: Orthopedics;  Laterality: Right;   Patient Active Problem List    Diagnosis Date Noted   CVA (cerebral vascular accident) (HCC) 03/14/2024   Cellulitis of right leg 12/21/2023   Onychomycosis 12/21/2023   Atherosclerosis of artery 03/12/2023   Adrenal nodule 11/16/2022   Thyroid  nodule 11/16/2022   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   Generalized obesity 10/05/2022   Memory loss 06/12/2022   Essential hypertension 04/13/2022   Obesity (BMI 30-39.9) 04/13/2022   Stress incontinence 11/08/2021   Status post revision of total replacement of right knee 05/07/2020   Failed total knee, right, 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/2020   History of total right knee replacement 01/28/2020   Vitamin D  deficiency 08/29/2018   Insulin  resistance 08/29/2018   Other specified glaucoma 08/14/2018   Incontinence of urine in female 11/07/2017   Vertigo 11/07/2017   Left knee pain 10/25/2016   Genetic testing 08/08/2016   Hypothyroidism 12/02/2015   Anxiety and depression 12/02/2015   OAB (overactive bladder) 12/02/2015   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/02/2015   BMI 37.0-37.9, adult 12/02/2015    ONSET DATE: 10/10/125  REFERRING DIAG: I63.9 (ICD-10-CM) -  CVA (cerebral vascular accident) (HCC)  THERAPY DIAG:  Dizziness and giddiness  Unsteadiness on feet  Other symptoms and signs involving the nervous system  Difficulty in walking, not elsewhere classified  Rationale for Evaluation and Treatment: Rehabilitation  SUBJECTIVE:                                                                                                                                                                                             SUBJECTIVE STATEMENT: Doing much better, feeling short duration dizziness mainly when arising. A little woozy with head movements   Pt accompanied by: significant other  PERTINENT HISTORY: Anemia, anxiety, L breast CA s/p B mastectomy, depression, HLD, HTN, B TKA  PAIN:  Are you having pain?  No  PRECAUTIONS: Fall  RED FLAGS: None   WEIGHT BEARING RESTRICTIONS: No  FALLS: Has patient fallen in last 6 months? Yes. Number of falls 1  LIVING ENVIRONMENT: Lives with: lives with their spouse Lives in: House/apartment Stairs: 2 step to enter with handrail; 2 story home with bedroom on 1st floor Has following equipment at home: Single point cane and Walker - 2 wheeled  PLOF: Independent  PATIENT GOALS: improve dizziness  OBJECTIVE:   TODAY'S TREATMENT: 04/15/24 Activity Comments  Right Dix-Hallpike  No dizziness/nystagmus  Left Dix-Hallpike Small, left upbeat x 5 sec  Left Epley   Left DH Small amplitude 4-5 beats left upbeat  Left Epley   M-CTSIB Tendency for retro-lateral LOB  Standing balance for HEP      M-CTSIB  Condition 1: Firm Surface, EO 30 Sec, Normal Sway  Condition 2: Firm Surface, EC 30 Sec, Mild Sway  Condition 3: Foam Surface, EO 16 Sec, Moderate and Severe Sway  Condition 4: Foam Surface, EC 14 Sec, Moderate and Severe Sway     TODAY'S TREATMENT: 04/10/24 Activity Comments  Berg 34/56  L loaded DH Possible R upbeating torsional nystagmus ~10 sec followed by slow persisting R beating nystagmus  R loaded DH C/o brief dizziness followed by R upbeating torsional nystagmus lasting ~30 sec  R Epley C/o dizziness in position 3; tolerated well  R DH  C/o brief dizziness followed by R upbeating torsional nystagmus lasting ~30 sec          HOME EXERCISE PROGRAM Last updated: 04/10/24 Access Code: [email or web address] URL: [email or web address] Date: 04/10/2024 Prepared by: [facility] - Outpatient  Rehab - [facility]  Program Notes perform with husband's supervision for safety  Exercises - Supine to Left Sidelying Vestibular Habituation  - 1 x daily - 5 x weekly - 2 sets - 3-5 reps - Supine to Right Sidelying Vestibular  Habituation  - 1 x daily - 5 x weekly - 2 sets - 3-5 reps - Seated Head Nods Vestibular Habituation  - 1 x daily - 5  x weekly - 2 sets - 30 sec hold - Feet Together Balance at [employer] Eyes Closed  - 7 x weekly - 3 sets - 30 sec hold - Corner Balance Feet Together: Eyes Open With Head Turns  - 1 x daily - 7 x weekly - 3 sets - 30 sec hold - Corner Balance Feet Together: Eyes Closed With Head Turns  - 1 x daily - 7 x weekly - 3 sets - 30 sec hold   PATIENT EDUCATION: Education details: edu on benefits of OT for visual changes- pt in agreement; exam findings and recommendation to continue using RW, HEP with cues for safety Person educated: Patient and Spouse Education method: Explanation, Demonstration, Tactile cues, Verbal cues, and Handouts Education comprehension: verbalized understanding and returned demonstration      Note: Objective measures were completed at Evaluation unless otherwise noted.  DIAGNOSTIC FINDINGS: 03/14/24 brain MRI: Acute 6 mm right splenium lacunar infarct. No acute hemorrhage or mass effect. 2. Underlying signal changes of advanced chronic small vessel disease in the bilateral white matter and deep gray nuclei.  COGNITION: Overall cognitive status: Within functional limits for tasks assessed; husband assists with subjective report    SENSATION: Pt reports slight N/T in R foot d/t neuropathy   COORDINATION: Alternating pronation/supination: slight dysmetria on L Alternating toe tap: WNL Finger to nose: WNL  Vitals: 147/96 mmHg, 96% spO2, 80bpm     POSTURE: rounded shoulders and forward head  GAIT: Findings: Assistive device utilized:Single point cane, Level of assistance: Min A, and Comments: requires assistance for balance from husband   VESTIBULAR ASSESSMENT   GENERAL OBSERVATION: pt wears glasses but not sure what kind   OCULOMOTOR EXAM: Eye alignment: L eye elevated in orbit   Ocular ROM: limited R eye abduction   Spontaneous Nystagmus: absent   Gaze-Induced Nystagmus: absent   Smooth Pursuits: difficulty tracking R and c/o diplopia at 45 degrees  gaze to each side   Saccades: disconjugate eye movement with R eye overshooting/missing target repeatedly and c/o diplopia with R gaze   Convergence/Divergence: 12 inches c/o diplopia cm    VESTIBULAR - OCULAR REFLEX:    Slow VOR: Normal   VOR Cancellation: Unable to Maintain Gaze; c/o diplopia   Head-Impulse Test: HIT Right: negative HIT Left: negative    POSITIONAL TESTING:   Right Sidelying: strong anxiety response and c/o dizziness with nystagmus but unable to identify d/t pt closing eyes Left Sidelying: negative                                                                                                                               TREATMENT DATE: 03/24/24    PATIENT EDUCATION: Education details: prognosis, POC, exam findings and need for MD f/u to address new oculomotor  findings that were not documented in the hospital  Person educated: Patient and Spouse Education method: Explanation Education comprehension: verbalized understanding  HOME EXERCISE PROGRAM: Not yet initiated    GOALS: Goals reviewed with patient? Yes  SHORT TERM GOALS: Target date: 04/21/2024  Patient to be independent with initial HEP. Baseline: HEP initiated Goal status: IN PROGRESS    LONG TERM GOALS: Target date: 05/19/2024  Patient to be independent with advanced HEP. Baseline: Not yet initiated  Goal status: IN PROGRESS  Patient to report 0/10 dizziness with standing vertical and horizontal VOR for 30 seconds. Baseline: Unable Goal status: IN PROGRESS  Patient will report 0/10 dizziness with bed mobility.  Baseline: Symptomatic  Goal status: IN PROGRESS  Patient to demonstrate ambulation for 352ft with LRAD and supervision for safety.  Baseline: min A d/t imbalance  Goal status: IN PROGRESS  Patient to score at least 15/24 on DGI in order to decrease risk of falls. Baseline: deferred d/t safety 04/10/24 Goal status: IN PROGRESS  Patient to score at least 40/56 on Berg in  order to decrease risk of falls. Baseline: 34/56 04/10/24 Goal status: IN PROGRESS   ASSESSMENT:  CLINICAL IMPRESSION: Reports markedly improved symptoms since last visit and canalith repositioning.  No vertigo/nystagmus right Dix-Hallpike. Brief, small amplitude left upbeating for left Dix-Hallpike and addressed with two reps of left Epley which where well tolerated.  M-CTSIB reveals difficulty with conditions 3 and 4 with tendency for slow, gradual retro-lateral LOB with decreased awareness/corrective reactions. Progressed HEP to include standing, multisensory balance. Continued sessions to advance POC details  OBJECTIVE IMPAIRMENTS: Abnormal gait, decreased activity tolerance, decreased balance, decreased coordination, difficulty walking, and dizziness.   ACTIVITY LIMITATIONS: carrying, lifting, bending, sitting, standing, squatting, sleeping, stairs, transfers, bed mobility, bathing, toileting, dressing, reach over head, hygiene/grooming, locomotion level, and caring for others  PARTICIPATION LIMITATIONS: meal prep, cleaning, laundry, driving, shopping, community activity, yard work, and church  PERSONAL FACTORS: Age, Behavior pattern, Past/current experiences, Time since onset of injury/illness/exacerbation, and 3+ comorbidities: Anemia, anxiety, L breast CA s/p B mastectomy, depression, HLD, HTN, B TKA are also affecting patient's functional outcome.   REHAB POTENTIAL: Good  CLINICAL DECISION MAKING: Evolving/moderate complexity  EVALUATION COMPLEXITY: Moderate  PLAN:  PT FREQUENCY: 1-2x/week  PT DURATION: 8 weeks  PLANNED INTERVENTIONS: 97164- PT Re-evaluation, 97750- Physical Performance Testing, 97110-Therapeutic exercises, 97530- Therapeutic activity, [id number]- Neuromuscular re-education, 97535- Self Care, [id number]- Manual therapy, [id number]- Gait training, [phone]- Canalith repositioning, Patient/Family education, Balance training, Stair training, Taping, Vestibular training, Cryotherapy,  and Moist heat  PLAN FOR NEXT SESSION: Recheck positional testing and treat; review HEP  12:38 PM, 04/15/24 M. Kelly Glenice Ciccone, PT, DPT Physical Therapist- [place] Office Number: [phone]

## 2024-04-21 NOTE — Therapy (Signed)
 OUTPATIENT PHYSICAL THERAPY NEURO TREATMENT   Patient Name: Vanessa Christensen MRN: [id number] DOB:1951/01/22, 73 y.o., female Today's Date: 04/22/2024   PCP:    Mahlon Comer BRAVO, MD   REFERRING PROVIDER: Raenelle Donalda HERO, MD  END OF SESSION:  PT End of Session - 04/22/24 1134     Visit Number 6    Number of Visits 17    Date for Recertification  05/19/24    Authorization Type Medicare/BCBS Federal    PT Start Time 1103    PT Stop Time 1135    PT Time Calculation (min) 32 min    Equipment Utilized During Treatment Gait belt    Activity Tolerance Other (comment)   emesis   Behavior During Therapy WFL for tasks assessed/performed              Past Medical History:  Diagnosis Date   Anemia    hx of  iron deficient   Anxiety    pt denies   Arthritis    hands and feet   Cancer (HCC) 10/04/2006   breast    left   Cataracts, both eyes    Depression    Depression    Diverticulosis of colon    GERD (gastroesophageal reflux disease)    Glaucoma    Hyperlipidemia    Hypertension    no meds   Joint pain    Knee pain    Obesity    OSA on CPAP    Osteoarthritis    Sleep apnea    no CPAP- no longer needed d/t weight loss   Thyroid  activity decreased    Past Surgical History:  Procedure Laterality Date   BREAST SURGERY Bilateral    CATARACT EXTRACTION     COLONOSCOPY     GLAUCOMA REPAIR     MASTECTOMY, RADICAL Bilateral    neulasta induced sterile abscesses     THYROIDECTOMY, PARTIAL     TONSILLECTOMY     TOTAL KNEE ARTHROPLASTY Right    TOTAL KNEE ARTHROPLASTY Left 10/27/2022   Procedure: LEFT TOTAL KNEE ARTHROPLASTY;  Surgeon: Vernetta Lonni GRADE, MD;  Location: [facility];  Service: Orthopedics;  Laterality: Left;   TOTAL KNEE REVISION Right 05/07/2020   Procedure: RIGHT TOTAL KNEE REVISION ARTHROPLASTY;  Surgeon: Vernetta Lonni GRADE, MD;  Location: [facility];  Service: Orthopedics;  Laterality: Right;   Patient Active Problem List   Diagnosis Date  Noted   CVA (cerebral vascular accident) (HCC) 03/14/2024   Cellulitis of right leg 12/21/2023   Onychomycosis 12/21/2023   Atherosclerosis of artery 03/12/2023   Adrenal nodule 11/16/2022   Thyroid  nodule 11/16/2022   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   Generalized obesity 10/05/2022   Memory loss 06/12/2022   Essential hypertension 04/13/2022   Obesity (BMI 30-39.9) 04/13/2022   Stress incontinence 11/08/2021   Status post revision of total replacement of right knee 05/07/2020   Failed total knee, right, 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/2020   History of total right knee replacement 01/28/2020   Vitamin D  deficiency 08/29/2018   Insulin  resistance 08/29/2018   Other specified glaucoma 08/14/2018   Incontinence of urine in female 11/07/2017   Vertigo 11/07/2017   Left knee pain 10/25/2016   Genetic testing 08/08/2016   Hypothyroidism 12/02/2015   Anxiety and depression 12/02/2015   OAB (overactive bladder) 12/02/2015   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/02/2015   BMI 37.0-37.9, adult 12/02/2015    ONSET DATE: 10/10/125  REFERRING DIAG: I63.9 (ICD-10-CM) -  CVA (cerebral vascular accident) (HCC)  THERAPY DIAG:  Dizziness and giddiness  Unsteadiness on feet  Other symptoms and signs involving the nervous system  Difficulty in walking, not elsewhere classified  Rationale for Evaluation and Treatment: Rehabilitation  SUBJECTIVE:                                                                                                                                                                                             SUBJECTIVE STATEMENT: It's going very well. I'm very happy. Reports not dizziness. Balance is so-so.    Pt accompanied by: significant other  PERTINENT HISTORY: Anemia, anxiety, L breast CA s/p B mastectomy, depression, HLD, HTN, B TKA  PAIN:  Are you having pain? No  PRECAUTIONS: Fall  RED  FLAGS: None   WEIGHT BEARING RESTRICTIONS: No  FALLS: Has patient fallen in last 6 months? Yes. Number of falls 1  LIVING ENVIRONMENT: Lives with: lives with their spouse Lives in: House/apartment Stairs: 2 step to enter with handrail; 2 story home with bedroom on 1st floor Has following equipment at home: Single point cane and Walker - 2 wheeled  PLOF: Independent  PATIENT GOALS: improve dizziness  OBJECTIVE:     TODAY'S TREATMENT: 04/22/24 Activity Comments  review HEP: R/L rolling sitting head nods to targets romberg EC romberg EO + head turns/nods romberg EC + head turns/nods No dizziness rolling; c/o mild dizziness upon sitting up on EOM. Report of mild dizziness with looking overhead. Mild-mod sway with balance activities and cueing to prevent trunk movement   Sitting head turns to targets 30 No dizziness   R loaded DH 1 sec of dizziness unable to visualize nystagmus; more significant dizziness and posterior LOB upon sitting up  L loaded DH  L upbeating torsional nystagmus lasting ~10 sec, pt then sat up and had episode of emesis.            PATIENT EDUCATION: Education details: discussed possible near for anti-nausea medication- patient in agreement and agreeable to this PT reaching out to PCP with this request Person educated: Patient and Spouse Education method: Explanation Education comprehension: verbalized understanding    HOME EXERCISE PROGRAM Last updated: 04/10/24 Access Code: [email or web address] URL: [email or web address] Date: 04/10/2024 Prepared by: [facility] - Outpatient  Rehab - [facility]  Program Notes perform with husband's supervision for safety  Exercises - Supine to Left Sidelying Vestibular Habituation  - 1 x daily - 5 x weekly - 2 sets - 3-5 reps - Supine to Right Sidelying Vestibular Habituation  - 1 x daily - 5 x weekly - 2 sets - 3-5 reps - Seated Head  Nods Vestibular Habituation  - 1 x daily - 5 x weekly - 2 sets - 30  sec hold - Feet Together Balance at [employer] Eyes Closed  - 7 x weekly - 3 sets - 30 sec hold - Corner Balance Feet Together: Eyes Open With Head Turns  - 1 x daily - 7 x weekly - 3 sets - 30 sec hold - Corner Balance Feet Together: Eyes Closed With Head Turns  - 1 x daily - 7 x weekly - 3 sets - 30 sec hold      Note: Objective measures were completed at Evaluation unless otherwise noted.  DIAGNOSTIC FINDINGS: 03/14/24 brain MRI: Acute 6 mm right splenium lacunar infarct. No acute hemorrhage or mass effect. 2. Underlying signal changes of advanced chronic small vessel disease in the bilateral white matter and deep gray nuclei.  COGNITION: Overall cognitive status: Within functional limits for tasks assessed; husband assists with subjective report    SENSATION: Pt reports slight N/T in R foot d/t neuropathy   COORDINATION: Alternating pronation/supination: slight dysmetria on L Alternating toe tap: WNL Finger to nose: WNL  Vitals: 147/96 mmHg, 96% spO2, 80bpm     POSTURE: rounded shoulders and forward head  GAIT: Findings: Assistive device utilized:Single point cane, Level of assistance: Min A, and Comments: requires assistance for balance from husband   VESTIBULAR ASSESSMENT   GENERAL OBSERVATION: pt wears glasses but not sure what kind   OCULOMOTOR EXAM: Eye alignment: L eye elevated in orbit   Ocular ROM: limited R eye abduction   Spontaneous Nystagmus: absent   Gaze-Induced Nystagmus: absent   Smooth Pursuits: difficulty tracking R and c/o diplopia at 45 degrees gaze to each side   Saccades: disconjugate eye movement with R eye overshooting/missing target repeatedly and c/o diplopia with R gaze   Convergence/Divergence: 12 inches c/o diplopia cm    VESTIBULAR - OCULAR REFLEX:    Slow VOR: Normal   VOR Cancellation: Unable to Maintain Gaze; c/o diplopia   Head-Impulse Test: HIT Right: negative HIT Left: negative    POSITIONAL TESTING:   Right Sidelying:  strong anxiety response and c/o dizziness with nystagmus but unable to identify d/t pt closing eyes Left Sidelying: negative                                                                                                                               TREATMENT DATE: 03/24/24    PATIENT EDUCATION: Education details: prognosis, POC, exam findings and need for MD f/u to address new oculomotor findings that were not documented in the hospital  Person educated: Patient and Spouse Education method: Explanation Education comprehension: verbalized understanding  HOME EXERCISE PROGRAM: Not yet initiated    GOALS: Goals reviewed with patient? Yes  SHORT TERM GOALS: Target date: 04/21/2024  Patient to be independent with initial HEP. Baseline: HEP initiated Goal status: IN PROGRESS    LONG TERM GOALS: Target date: 05/19/2024  Patient to  be independent with advanced HEP. Baseline: Not yet initiated  Goal status: IN PROGRESS  Patient to report 0/10 dizziness with standing vertical and horizontal VOR for 30 seconds. Baseline: Unable Goal status: IN PROGRESS  Patient will report 0/10 dizziness with bed mobility.  Baseline: Symptomatic  Goal status: IN PROGRESS  Patient to demonstrate ambulation for 329ft with LRAD and supervision for safety.  Baseline: min A d/t imbalance  Goal status: IN PROGRESS  Patient to score at least 15/24 on DGI in order to decrease risk of falls. Baseline: deferred d/t safety 04/10/24 Goal status: IN PROGRESS  Patient to score at least 40/56 on Berg in order to decrease risk of falls. Baseline: 34/56 04/10/24 Goal status: IN PROGRESS   ASSESSMENT:  CLINICAL IMPRESSION: Patient arrived to session with report of improvement in dizziness. She ambulated into clinic with wide based gait and unsteadiness without AD; however balance does look improved compared to prior sessions. Session focused on HEP review which resulted in mild dizziness when sitting up  at edge of mat and with looking overhead. Positional testing  revealed remaining dizziness with B DH and patient had episode of emesis in L DH. Allowed symptoms to resolve and discussed possible benefit of anti-nausea meds to improve tolerance for PT sessions as patient reports nausea as soon as she gets up in the morning. Patent agreeable with this plan and was able to safely exit with husband.   OBJECTIVE IMPAIRMENTS: Abnormal gait, decreased activity tolerance, decreased balance, decreased coordination, difficulty walking, and dizziness.   ACTIVITY LIMITATIONS: carrying, lifting, bending, sitting, standing, squatting, sleeping, stairs, transfers, bed mobility, bathing, toileting, dressing, reach over head, hygiene/grooming, locomotion level, and caring for others  PARTICIPATION LIMITATIONS: meal prep, cleaning, laundry, driving, shopping, community activity, yard work, and church  PERSONAL FACTORS: Age, Behavior pattern, Past/current experiences, Time since onset of injury/illness/exacerbation, and 3+ comorbidities: Anemia, anxiety, L breast CA s/p B mastectomy, depression, HLD, HTN, B TKA are also affecting patient's functional outcome.   REHAB POTENTIAL: Good  CLINICAL DECISION MAKING: Evolving/moderate complexity  EVALUATION COMPLEXITY: Moderate  PLAN:  PT FREQUENCY: 1-2x/week  PT DURATION: 8 weeks  PLANNED INTERVENTIONS: 97164- PT Re-evaluation, 97750- Physical Performance Testing, 97110-Therapeutic exercises, 97530- Therapeutic activity, [id number]- Neuromuscular re-education, 97535- Self Care, 02859- Manual therapy, [id number]- Gait training, [phone]- Canalith repositioning, Patient/Family education, Balance training, Stair training, Taping, Vestibular training, Cryotherapy, and Moist heat  PLAN FOR NEXT SESSION: has pt gotten anti-nausea meds? Recheck positional testing and treat; work on balance      Louana Terrilyn Christians, PT, DPT 04/22/24 11:45 AM  [facility] at  [facility] [address] [place], [address] [place], [place] [address] Phone # [phone] Fax # [phone]

## 2024-04-22 ENCOUNTER — Encounter: Payer: Self-pay | Admitting: Physical Therapy

## 2024-04-22 ENCOUNTER — Ambulatory Visit: Admitting: Physical Therapy

## 2024-04-22 ENCOUNTER — Telehealth: Payer: Self-pay | Admitting: Physical Therapy

## 2024-04-22 DIAGNOSIS — R42 Dizziness and giddiness: Secondary | ICD-10-CM | POA: Diagnosis not present

## 2024-04-22 DIAGNOSIS — R2681 Unsteadiness on feet: Secondary | ICD-10-CM

## 2024-04-22 DIAGNOSIS — R41842 Visuospatial deficit: Secondary | ICD-10-CM | POA: Diagnosis not present

## 2024-04-22 DIAGNOSIS — R278 Other lack of coordination: Secondary | ICD-10-CM | POA: Diagnosis not present

## 2024-04-22 DIAGNOSIS — R262 Difficulty in walking, not elsewhere classified: Secondary | ICD-10-CM

## 2024-04-22 DIAGNOSIS — R29818 Other symptoms and signs involving the nervous system: Secondary | ICD-10-CM | POA: Diagnosis not present

## 2024-04-22 MED ORDER — ONDANSETRON HCL 4 MG PO TABS: 4.0000 mg | ORAL_TABLET | Freq: Three times a day (TID) | ORAL | 0 refills | Status: DC | PRN

## 2024-04-22 NOTE — Telephone Encounter (Signed)
 Prescription sent for Zofran  to use as needed for nausea

## 2024-04-22 NOTE — Addendum Note (Signed)
 Addended by: Jailan Trimm E on: 04/22/2024 11:38 AM   Modules accepted: Orders

## 2024-04-22 NOTE — Telephone Encounter (Signed)
 Hi Dr, Mahlon,  I am seeing Vanessa Christensen in [place] for dizziness s/p stroke. She had an episode of emesis during positional testing today and also reports continued nausea at home. I wanted to know what you thought about prescribing her an anti-nausea medication to help her tolerate PT sessions?  Thanks!  Louana Terrilyn Christians, PT, DPT 04/22/24 11:30 AM  [facility] [address] [place], [address] [place], [place] [address] Phone # [phone] Fax # [phone]

## 2024-04-24 ENCOUNTER — Ambulatory Visit: Admitting: Occupational Therapy

## 2024-04-24 ENCOUNTER — Ambulatory Visit: Admitting: Physical Therapy

## 2024-04-24 ENCOUNTER — Encounter: Payer: Self-pay | Admitting: Physical Therapy

## 2024-04-24 ENCOUNTER — Other Ambulatory Visit (HOSPITAL_COMMUNITY): Payer: Self-pay

## 2024-04-24 ENCOUNTER — Other Ambulatory Visit: Payer: Self-pay

## 2024-04-24 DIAGNOSIS — R42 Dizziness and giddiness: Secondary | ICD-10-CM | POA: Diagnosis not present

## 2024-04-24 DIAGNOSIS — R41842 Visuospatial deficit: Secondary | ICD-10-CM

## 2024-04-24 DIAGNOSIS — R29818 Other symptoms and signs involving the nervous system: Secondary | ICD-10-CM | POA: Diagnosis not present

## 2024-04-24 DIAGNOSIS — R262 Difficulty in walking, not elsewhere classified: Secondary | ICD-10-CM | POA: Diagnosis not present

## 2024-04-24 DIAGNOSIS — R278 Other lack of coordination: Secondary | ICD-10-CM

## 2024-04-24 DIAGNOSIS — R2681 Unsteadiness on feet: Secondary | ICD-10-CM | POA: Diagnosis not present

## 2024-04-24 NOTE — Therapy (Signed)
 OUTPATIENT PHYSICAL THERAPY NEURO TREATMENT   Patient Name: Vanessa Christensen MRN: [id number] DOB:1951/05/22, 73 y.o., female Today's Date: 04/24/2024   PCP:    Mahlon Comer BRAVO, MD   REFERRING PROVIDER: Raenelle Donalda HERO, MD  END OF SESSION:  PT End of Session - 04/24/24 0803     Visit Number 7    Number of Visits 17    Date for Recertification  05/19/24    Authorization Type Medicare/BCBS Federal    PT Start Time 0804    PT Stop Time 0848    PT Time Calculation (min) 44 min    Equipment Utilized During Treatment --    Activity Tolerance Patient tolerated treatment well    Behavior During Therapy [facility] for tasks assessed/performed               Past Medical History:  Diagnosis Date   Anemia    hx of  iron deficient   Anxiety    pt denies   Arthritis    hands and feet   Cancer (HCC) 10/04/2006   breast    left   Cataracts, both eyes    Depression    Depression    Diverticulosis of colon    GERD (gastroesophageal reflux disease)    Glaucoma    Hyperlipidemia    Hypertension    no meds   Joint pain    Knee pain    Obesity    OSA on CPAP    Osteoarthritis    Sleep apnea    no CPAP- no longer needed d/t weight loss   Thyroid  activity decreased    Past Surgical History:  Procedure Laterality Date   BREAST SURGERY Bilateral    CATARACT EXTRACTION     COLONOSCOPY     GLAUCOMA REPAIR     MASTECTOMY, RADICAL Bilateral    neulasta induced sterile abscesses     THYROIDECTOMY, PARTIAL     TONSILLECTOMY     TOTAL KNEE ARTHROPLASTY Right    TOTAL KNEE ARTHROPLASTY Left 10/27/2022   Procedure: LEFT TOTAL KNEE ARTHROPLASTY;  Surgeon: Vernetta Lonni GRADE, MD;  Location: [facility];  Service: Orthopedics;  Laterality: Left;   TOTAL KNEE REVISION Right 05/07/2020   Procedure: RIGHT TOTAL KNEE REVISION ARTHROPLASTY;  Surgeon: Vernetta Lonni GRADE, MD;  Location: [facility];  Service: Orthopedics;  Laterality: Right;   Patient Active Problem List   Diagnosis  Date Noted   CVA (cerebral vascular accident) (HCC) 03/14/2024   Cellulitis of right leg 12/21/2023   Onychomycosis 12/21/2023   Atherosclerosis of artery 03/12/2023   Adrenal nodule 11/16/2022   Thyroid  nodule 11/16/2022   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   Generalized obesity 10/05/2022   Memory loss 06/12/2022   Essential hypertension 04/13/2022   Obesity (BMI 30-39.9) 04/13/2022   Stress incontinence 11/08/2021   Status post revision of total replacement of right knee 05/07/2020   Failed total knee, right, 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/2020   History of total right knee replacement 01/28/2020   Vitamin D  deficiency 08/29/2018   Insulin  resistance 08/29/2018   Other specified glaucoma 08/14/2018   Incontinence of urine in female 11/07/2017   Vertigo 11/07/2017   Left knee pain 10/25/2016   Genetic testing 08/08/2016   Hypothyroidism 12/02/2015   Anxiety and depression 12/02/2015   OAB (overactive bladder) 12/02/2015   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/02/2015   BMI 37.0-37.9, adult 12/02/2015    ONSET DATE: 10/10/125  REFERRING DIAG: I63.9 (ICD-10-CM) -  CVA (cerebral vascular accident) (HCC)  THERAPY DIAG:  Dizziness and giddiness  Unsteadiness on feet  Rationale for Evaluation and Treatment: Rehabilitation  SUBJECTIVE:                                                                                                                                                                                             SUBJECTIVE STATEMENT: Haven't been feeling good since Tuesday.  Went home and slept the whole day.  Yesterday was okay, but I slept a lot.  Feel out of it today, which is what happens when the vomiting comes on.  The Zofran  has been ordered, and have not gotten it yet.    Pt accompanied by: significant other  PERTINENT HISTORY: Anemia, anxiety, L breast CA s/p B mastectomy, depression, HLD, HTN, B TKA  PAIN:   Are you having pain? No  PRECAUTIONS: Fall  RED FLAGS: None   WEIGHT BEARING RESTRICTIONS: No  FALLS: Has patient fallen in last 6 months? Yes. Number of falls 1  LIVING ENVIRONMENT: Lives with: lives with their spouse Lives in: House/apartment Stairs: 2 step to enter with handrail; 2 story home with bedroom on 1st floor Has following equipment at home: Single point cane and Walker - 2 wheeled  PLOF: Independent  PATIENT GOALS: improve dizziness  OBJECTIVE:   Rates symptoms as 6/10-legs feel shaky, hands shaky, unsteady, fogginess in head. Pt does report taking a few of her anxiety pills yesterday.  TODAY'S TREATMENT: 04/24/2024 Activity Comments  Vitals:140/92 HR 88   R loaded DH Negative/no nystagmus, dizzy approx 20 sec upon return to sit  L loaded DH No nystagmus/negative  L roll Negative  R roll Apogeotropic nystagmus <30 seconds upon having pt follow fingers (pt reports wavy-ness on wall)  Bow and lean test No discernable nystagmus noted either position  Reviewed seated head nods/turns  No increase in symptoms  Seated forward flexion and return to upright sit x 3 reps Mild symptoms lasting 15 seconds Repeated 3 sets  Reports no increase in symptoms from 1 set to the next    HOME EXERCISE PROGRAM Access Code: [email or web address] URL: [email or web address] Date: 04/24/2024 Prepared by: [facility]  Program Notes perform with husband's supervision for safety  Exercises - Supine to Left Sidelying Vestibular Habituation  - 1 x daily - 5 x weekly - 2 sets - 3-5 reps - Supine to Right Sidelying Vestibular Habituation  - 1 x daily - 5 x weekly - 2 sets - 3-5 reps - Seated Head Nods Vestibular Habituation  - 1 x daily - 5 x weekly -  2 sets - 30 sec hold - Feet Together Balance at [employer] Eyes Closed  - 7 x weekly - 3 sets - 30 sec hold - Corner Balance Feet Together: Eyes Open With Head Turns  - 1 x daily - 7 x weekly - 3  sets - 30 sec hold - Corner Balance Feet Together: Eyes Closed With Head Turns  - 1 x daily - 7 x weekly - 3 sets - 30 sec hold - Seated Active Hip Flexion  - 3 x daily - 7 x weekly - 3 sets - 3 reps  TREATMENT: 04/22/24 Activity Comments  review HEP: R/L rolling sitting head nods to targets romberg EC romberg EO + head turns/nods romberg EC + head turns/nods No dizziness rolling; c/o mild dizziness upon sitting up on EOM. Report of mild dizziness with looking overhead. Mild-mod sway with balance activities and cueing to prevent trunk movement   Sitting head turns to targets 30 No dizziness   R loaded DH 1 sec of dizziness unable to visualize nystagmus; more significant dizziness and posterior LOB upon sitting up  L loaded DH  L upbeating torsional nystagmus lasting ~10 sec, pt then sat up and had episode of emesis.            PATIENT EDUCATION: Education details: 04/24/2024 Discussed BPPV testing today-no posterior canal findings; ? Horizontal canal R or L (apogeotophic nystagmus < 30 sec); habituation with nose to knee exercise Person educated: Patient and Spouse Education method: Explanation Education comprehension: verbalized understanding       Note: Objective measures were completed at Evaluation unless otherwise noted.  DIAGNOSTIC FINDINGS: 03/14/24 brain MRI: Acute 6 mm right splenium lacunar infarct. No acute hemorrhage or mass effect. 2. Underlying signal changes of advanced chronic small vessel disease in the bilateral white matter and deep gray nuclei.  COGNITION: Overall cognitive status: Within functional limits for tasks assessed; husband assists with subjective report    SENSATION: Pt reports slight N/T in R foot d/t neuropathy   COORDINATION: Alternating pronation/supination: slight dysmetria on L Alternating toe tap: WNL Finger to nose: WNL  Vitals: 147/96 mmHg, 96% spO2, 80bpm     POSTURE: rounded shoulders and forward head  GAIT: Findings:  Assistive device utilized:Single point cane, Level of assistance: Min A, and Comments: requires assistance for balance from husband   VESTIBULAR ASSESSMENT   GENERAL OBSERVATION: pt wears glasses but not sure what kind   OCULOMOTOR EXAM: Eye alignment: L eye elevated in orbit   Ocular ROM: limited R eye abduction   Spontaneous Nystagmus: absent   Gaze-Induced Nystagmus: absent   Smooth Pursuits: difficulty tracking R and c/o diplopia at 45 degrees gaze to each side   Saccades: disconjugate eye movement with R eye overshooting/missing target repeatedly and c/o diplopia with R gaze   Convergence/Divergence: 12 inches c/o diplopia cm    VESTIBULAR - OCULAR REFLEX:    Slow VOR: Normal   VOR Cancellation: Unable to Maintain Gaze; c/o diplopia   Head-Impulse Test: HIT Right: negative HIT Left: negative    POSITIONAL TESTING:   Right Sidelying: strong anxiety response and c/o dizziness with nystagmus but unable to identify d/t pt closing eyes Left Sidelying: negative  TREATMENT DATE: 03/24/24    PATIENT EDUCATION: Education details: prognosis, POC, exam findings and need for MD f/u to address new oculomotor findings that were not documented in the hospital  Person educated: Patient and Spouse Education method: Explanation Education comprehension: verbalized understanding  HOME EXERCISE PROGRAM: Not yet initiated    GOALS: Goals reviewed with patient? Yes  SHORT TERM GOALS: Target date: 04/21/2024  Patient to be independent with initial HEP. Baseline: HEP initiated Goal status: IN PROGRESS    LONG TERM GOALS: Target date: 05/19/2024  Patient to be independent with advanced HEP. Baseline: Not yet initiated  Goal status: IN PROGRESS  Patient to report 0/10 dizziness with standing vertical and horizontal VOR for 30 seconds. Baseline:  Unable Goal status: IN PROGRESS  Patient will report 0/10 dizziness with bed mobility.  Baseline: Symptomatic  Goal status: IN PROGRESS  Patient to demonstrate ambulation for 368ft with LRAD and supervision for safety.  Baseline: min A d/t imbalance  Goal status: IN PROGRESS  Patient to score at least 15/24 on DGI in order to decrease risk of falls. Baseline: deferred d/t safety 04/10/24 Goal status: IN PROGRESS  Patient to score at least 40/56 on Berg in order to decrease risk of falls. Baseline: 34/56 04/10/24 Goal status: IN PROGRESS   ASSESSMENT:  CLINICAL IMPRESSION: Pt presents today not feeling as good since last session; she reports overall fogginess, but no additional nausea/vomiting/spinning.  She has order for anti-nausea medication, but has not gotten it yet.   Skilled PT session focused on reassessing BPPV, with pt negative/no nystagmus for posterior canal BPPV.  She does note wavy lines on wall with R roll test and slight apogeotropic nystagmus <30 seconds (so unsure whether to treat R or L); Bow and [place] does not show nystagmus so unable to discern further.  Pt does not note any spinning or any nausea today, so proceeded with seated habituation exercises, adding to HEP.  Proceeding slowly with caution and pt will continue to benefit from skilled PT towards goals for improved functional mobility and decreased fall risk.   OBJECTIVE IMPAIRMENTS: Abnormal gait, decreased activity tolerance, decreased balance, decreased coordination, difficulty walking, and dizziness.   ACTIVITY LIMITATIONS: carrying, lifting, bending, sitting, standing, squatting, sleeping, stairs, transfers, bed mobility, bathing, toileting, dressing, reach over head, hygiene/grooming, locomotion level, and caring for others  PARTICIPATION LIMITATIONS: meal prep, cleaning, laundry, driving, shopping, community activity, yard work, and church  PERSONAL FACTORS: Age, Behavior pattern, Past/current  experiences, Time since onset of injury/illness/exacerbation, and 3+ comorbidities: Anemia, anxiety, L breast CA s/p B mastectomy, depression, HLD, HTN, B TKA are also affecting patient's functional outcome.   REHAB POTENTIAL: Good  CLINICAL DECISION MAKING: Evolving/moderate complexity  EVALUATION COMPLEXITY: Moderate  PLAN:  PT FREQUENCY: 1-2x/week  PT DURATION: 8 weeks  PLANNED INTERVENTIONS: [id number]- PT Re-evaluation, 97750- Physical Performance Testing, 97110-Therapeutic exercises, 97530- Therapeutic activity, [id number]- Neuromuscular re-education, 97535- Self Care, [id number]- Manual therapy, [id number]- Gait training, [phone]- Canalith repositioning, Patient/Family education, Balance training, Stair training, Taping, Vestibular training, Cryotherapy, and Moist heat  PLAN FOR NEXT SESSION:  How did seated habituation exercises go?  Recheck positional testing (*R roll for canal/cupulo?) and treat; work on [employer], PT 04/24/24 2:55 PM Phone: [phone] Fax: [phone]  [facility] at [facility] [address] [place], [place] [address] Phone # [phone] Fax # [phone]

## 2024-04-24 NOTE — Therapy (Signed)
 OUTPATIENT OCCUPATIONAL THERAPY NEURO EVALUATION  Patient Name: Vanessa Christensen MRN: [id number] DOB:[birth year], 73 y.o., female Today's Date: 04/24/2024  PCP: Mahlon Comer BRAVO, MD REFERRING PROVIDER:   Douglass Kenney NOVAK, FNP    END OF SESSION:  OT End of Session - 04/24/24 1118     Visit Number 1    Number of Visits 9    Date for Recertification  06/06/24    Authorization Type Medicare Part A&B / BCBS Fed 2025    OT Start Time [phone]    OT Stop Time 0934    OT Time Calculation (min) 45 min          Past Medical History:  Diagnosis Date   Anemia    hx of  iron deficient   Anxiety    pt denies   Arthritis    hands and feet   Cancer (HCC) 10/04/2006   breast    left   Cataracts, both eyes    Depression    Depression    Diverticulosis of colon    GERD (gastroesophageal reflux disease)    Glaucoma    Hyperlipidemia    Hypertension    no meds   Joint pain    Knee pain    Obesity    OSA on CPAP    Osteoarthritis    Sleep apnea    no CPAP- no longer needed d/t weight loss   Thyroid  activity decreased    Past Surgical History:  Procedure Laterality Date   BREAST SURGERY Bilateral    CATARACT EXTRACTION     COLONOSCOPY     GLAUCOMA REPAIR     MASTECTOMY, RADICAL Bilateral    neulasta induced sterile abscesses     THYROIDECTOMY, PARTIAL     TONSILLECTOMY     TOTAL KNEE ARTHROPLASTY Right    TOTAL KNEE ARTHROPLASTY Left 10/27/2022   Procedure: LEFT TOTAL KNEE ARTHROPLASTY;  Surgeon: Vernetta Lonni GRADE, MD;  Location: [facility];  Service: Orthopedics;  Laterality: Left;   TOTAL KNEE REVISION Right 05/07/2020   Procedure: RIGHT TOTAL KNEE REVISION ARTHROPLASTY;  Surgeon: Vernetta Lonni GRADE, MD;  Location: [facility];  Service: Orthopedics;  Laterality: Right;   Patient Active Problem List   Diagnosis Date Noted   CVA (cerebral vascular accident) (HCC) 03/14/2024   Cellulitis of right leg 12/21/2023   Onychomycosis 12/21/2023   Atherosclerosis of artery  03/12/2023   Adrenal nodule 11/16/2022   Thyroid  nodule 11/16/2022   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   Generalized obesity 10/05/2022   Memory loss 06/12/2022   Essential hypertension 04/13/2022   Obesity (BMI 30-39.9) 04/13/2022   Stress incontinence 11/08/2021   Status post revision of total replacement of right knee 05/07/2020   Failed total knee, right, 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/2020   History of total right knee replacement 01/28/2020   Vitamin D  deficiency 08/29/2018   Insulin  resistance 08/29/2018   Other specified glaucoma 08/14/2018   Incontinence of urine in female 11/07/2017   Vertigo 11/07/2017   Left knee pain 10/25/2016   Genetic testing 08/08/2016   Hypothyroidism 12/02/2015   Anxiety and depression 12/02/2015   OAB (overactive bladder) 12/02/2015   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/02/2015   BMI 37.0-37.9, adult 12/02/2015    ONSET DATE: referral 04/14/24  REFERRING DIAG: H51.9 (ICD-10-CM) - Abnormal eye movements   THERAPY DIAG:  Visuospatial deficit  Other lack of coordination  Rationale for Evaluation and Treatment: Rehabilitation  SUBJECTIVE:   SUBJECTIVE STATEMENT:  Pt went to the ED on 03/14/24 with dizziness and vomiting.  MRI showed mini stroke.  Pt reporting that the dizziness had been gone for ~8 days but has returned Tues 04/22/24.  Pt reports that focusing can be a challenge as she feels that her head is really heavy.   Pt accompanied by: self and significant other  PERTINENT HISTORY: 73 y.o. female with medical history significant of hypertension, hyperlipidemia who presented to the ED due to dizziness.  Patient had been dizzy for 24 hours which has been progressively worsening and with vomiting.  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  On PT eval pt with disconjugate gaze with R not tracking with L eye.  PMH/o Anemia, anxiety, L breast CA  s/p B mastectomy, depression, HLD, HTN, B TKA   PRECAUTIONS: Fall  WEIGHT BEARING RESTRICTIONS: No  PAIN:  Are you having pain? No  FALLS: Has patient fallen in last 6 months? Yes. Number of falls 2 - one in the backyard and one entering restaurant   LIVING ENVIRONMENT: Lives with: lives with their spouse Lives in: House/apartment Stairs: 2 step to enter with handrail; 2 story home with bedroom on 1st floor Has following equipment at home: Single point cane and Walker - 2 wheeled, built in seat in shower that she does not use, grab bar next to the toilet, in plans for remodel in bathroom to elevate toilet seat  PLOF: Independent and Independent with basic ADLs; would ask for assistance with balance when navigating curbs in the community and if dropping item  PATIENT GOALS: to walk without tripping  OBJECTIVE:  Note: Objective measures were completed at Evaluation unless otherwise noted.  HAND DOMINANCE: Right  ADLs: Transfers/ambulation related to ADLs: Mod I, will occasionally use walker when vertigo is really bad Grooming: Mod I UB Dressing: Mod I LB Dressing: husband assisting with donning socks s/p knee surgery Toileting: Mod I Bathing: Mod I Tub Shower transfers: Mod I   IADLs:  Pt is doing majority of the cooking and will do some household tasks but feels that she doesn't have the energy.  Pt reports that the lighting in stores will effect her.   MOBILITY STATUS: Hx of falls and occasional hand held assist especially when in the community, has used RW when experiencing more vertigo symptoms  POSTURE COMMENTS:  rounded shoulders and forward head   ACTIVITY TOLERANCE: Activity tolerance: decreased endurance s/p CVA  FUNCTIONAL OUTCOME MEASURES: PSFS   UPPER EXTREMITY ROM:  WFL bilaterally   UPPER EXTREMITY MMT:   Grossly 4+ to 5 overall   COORDINATION: 9 Hole Peg test: Right: 29.63 sec; Left: 33.28 sec  SENSATION: Pt reports slight N/T in R foot d/t  neuropathy   COGNITION: Overall cognitive status: Within functional limits for tasks assessed  VISION: Subjective report: wears glasses 80% of the time Baseline vision: Wears glasses all the time Visual history: cataracts removal  VISION ASSESSMENT: Tracking/Visual pursuits: Right eye does not track laterally and Decreased smoothness with horizontal tracking; Diplopia with vertical tracking upwards Convergence: Impaired: reporting diplopia 6-7 from nose Visual Fields: no apparent deficits  Completed Convergence Insufficiency Symptom Survey (CISS): 21/56 21 or higher is suggestive of convergence insufficiency  Patient has difficulty with following activities due to following visual impairments: reading, tying shoes  PERCEPTION: WFL  PRAXIS: WFL  TREATMENT DATE:  04/24/24 Educated on potential benefit of exercises to address convergence insufficiency as well as disconjugate gaze with R eye.  Pt also reporting increased difficulty with reading even before CVA, therefore pt may benefit from adaptive strategies and/or equipment to aid in reading.       PATIENT EDUCATION: Education details: Educated on role and purpose of OT as well as potential interventions and goals for therapy based on initial evaluation findings. Person educated: Patient and Spouse Education method: Explanation Education comprehension: verbalized understanding and needs further education  HOME EXERCISE PROGRAM: TBD   GOALS: Goals reviewed with patient? Yes  SHORT TERM GOALS: Target date: 05/16/24  Pt will independently return demo of vision HEP. Baseline: new to OPOT Goal status: INITIAL  2.  Pt will verbalize understanding of energy conservation strategies and recall 3 in use. Baseline: easily fatigued and decreased activity tolerance Goal status: INITIAL  3.  Pt will  independently recall or demonstrate at least 3 vision compensation strategies. Baseline: disconjugate gaze/convergence insufficiency, and low vision Goal status: INITIAL   LONG TERM GOALS: Target date: 06/06/24  Patient will report at least two-point increase in average PSFS score or at least three-point increase in a single activity score indicating functionally significant improvement given minimum detectable change. Baseline: 4.3 Goal status: INITIAL  2.  Pt will report ability to read 15 mins with improved ease with use of AE/strategies PRN. Baseline: reports losing place when reading, diplopia during reaching Goal status: INITIAL  3.  Pt will report improved score on CISS to <21 for improved convergence. Baseline: 21/56 Goal status: INITIAL  4.  Pt will complete table top visual scanning activity at Mod I level with use of adaptive strategies PRN. Baseline: TBD Goal status: INITIAL   ASSESSMENT:  CLINICAL IMPRESSION: Patient is a 73 y.o. female who was seen today for occupational therapy evaluation for disconjugate gaze s/p CVA. Pt reporting diplopia with vertical gaze, especially upwards, and difficulty with focus, recall, and speed with reading.  Pt reporting that she will easily lose her place when reading and feels that print is getting smaller. Pt currently lives with spouse who is providing intermittent physical (hand held assist) with ambulation particularly when in the community due to frequency of dizziness. Pt will benefit from skilled occupational therapy services to address strength and coordination of eyes, balance, safety awareness, introduction of compensatory strategies/AE prn, visual-perception, and implementation of an HEP to improve participation and safety during ADLs and IADLs.    PERFORMANCE DEFICITS: in functional skills including ADLs, IADLs, ROM, balance, endurance, decreased knowledge of precautions, decreased knowledge of use of DME, and vision and  psychosocial skills including coping strategies and environmental adaptation.   IMPAIRMENTS: are limiting patient from ADLs, IADLs, and reading.   CO-MORBIDITIES: may have co-morbidities  that affects occupational performance. Patient will benefit from skilled OT to address above impairments and improve overall function.  MODIFICATION OR ASSISTANCE TO COMPLETE EVALUATION: No modification of tasks or assist necessary to complete an evaluation.  OT OCCUPATIONAL PROFILE AND HISTORY: Problem focused assessment: Including review of records relating to presenting problem.  CLINICAL DECISION MAKING: LOW - limited treatment options, no task modification necessary  REHAB POTENTIAL: Good  EVALUATION COMPLEXITY: Low    PLAN:  OT FREQUENCY: 1-2x/week  OT DURATION: 6 weeks  PLANNED INTERVENTIONS: 97168 OT Re-evaluation, 97535 self care/ADL training, 02889 therapeutic exercise, 97530 therapeutic activity, 97112 neuromuscular re-education, functional mobility training, visual/perceptual remediation/compensation, psychosocial skills training, energy conservation, coping strategies training, patient/family education,  and DME and/or AE instructions  RECOMMENDED OTHER SERVICES: NA  CONSULTED AND AGREED WITH PLAN OF CARE: Patient and family member/caregiver  PLAN FOR NEXT SESSION: initiate visual tracking, saccades, convergence HEP, complete table top visual scanning with Bell cancellation and then peg board task, incorporate reading, educate on adaptive strategies and/or equipment to aid in low vision and diplopia   KAYLENE DOMINO, OTR/L 04/24/2024, 11:21 AM  [facility] at [facility] [address], [address] [place], [place] [address] Phone # [phone] Fax # [phone]

## 2024-04-29 ENCOUNTER — Ambulatory Visit: Admitting: Occupational Therapy

## 2024-04-29 ENCOUNTER — Ambulatory Visit: Admitting: Physical Therapy

## 2024-04-29 ENCOUNTER — Encounter: Payer: Self-pay | Admitting: Physical Therapy

## 2024-04-29 DIAGNOSIS — R29818 Other symptoms and signs involving the nervous system: Secondary | ICD-10-CM | POA: Diagnosis not present

## 2024-04-29 DIAGNOSIS — R42 Dizziness and giddiness: Secondary | ICD-10-CM

## 2024-04-29 DIAGNOSIS — R41842 Visuospatial deficit: Secondary | ICD-10-CM | POA: Diagnosis not present

## 2024-04-29 DIAGNOSIS — R2681 Unsteadiness on feet: Secondary | ICD-10-CM | POA: Diagnosis not present

## 2024-04-29 DIAGNOSIS — R278 Other lack of coordination: Secondary | ICD-10-CM

## 2024-04-29 DIAGNOSIS — R262 Difficulty in walking, not elsewhere classified: Secondary | ICD-10-CM | POA: Diagnosis not present

## 2024-04-29 NOTE — Therapy (Signed)
 OUTPATIENT PHYSICAL THERAPY NEURO TREATMENT   Patient Name: Vanessa Christensen MRN: [id number] DOB:[birth year], 73 y.o., female Today's Date: 04/29/2024   PCP:    Mahlon Comer BRAVO, MD   REFERRING PROVIDER: Raenelle Donalda HERO, MD  END OF SESSION:  PT End of Session - 04/29/24 1018     Visit Number 8    Number of Visits 17    Date for Recertification  05/19/24    Authorization Type Medicare/BCBS Federal    PT Start Time 1020    PT Stop Time 1100    PT Time Calculation (min) 40 min    Activity Tolerance Patient tolerated treatment well    Behavior During Therapy [facility] for tasks assessed/performed                Past Medical History:  Diagnosis Date   Anemia    hx of  iron deficient   Anxiety    pt denies   Arthritis    hands and feet   Cancer (HCC) 10/04/2006   breast    left   Cataracts, both eyes    Depression    Depression    Diverticulosis of colon    GERD (gastroesophageal reflux disease)    Glaucoma    Hyperlipidemia    Hypertension    no meds   Joint pain    Knee pain    Obesity    OSA on CPAP    Osteoarthritis    Sleep apnea    no CPAP- no longer needed d/t weight loss   Thyroid  activity decreased    Past Surgical History:  Procedure Laterality Date   BREAST SURGERY Bilateral    CATARACT EXTRACTION     COLONOSCOPY     GLAUCOMA REPAIR     MASTECTOMY, RADICAL Bilateral    neulasta induced sterile abscesses     THYROIDECTOMY, PARTIAL     TONSILLECTOMY     TOTAL KNEE ARTHROPLASTY Right    TOTAL KNEE ARTHROPLASTY Left 10/27/2022   Procedure: LEFT TOTAL KNEE ARTHROPLASTY;  Surgeon: Vernetta Lonni GRADE, MD;  Location: [facility];  Service: Orthopedics;  Laterality: Left;   TOTAL KNEE REVISION Right 05/07/2020   Procedure: RIGHT TOTAL KNEE REVISION ARTHROPLASTY;  Surgeon: Vernetta Lonni GRADE, MD;  Location: [facility];  Service: Orthopedics;  Laterality: Right;   Patient Active Problem List   Diagnosis Date Noted   CVA (cerebral vascular  accident) (HCC) 03/14/2024   Cellulitis of right leg 12/21/2023   Onychomycosis 12/21/2023   Atherosclerosis of artery 03/12/2023   Adrenal nodule 11/16/2022   Thyroid  nodule 11/16/2022   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   Generalized obesity 10/05/2022   Memory loss 06/12/2022   Essential hypertension 04/13/2022   Obesity (BMI 30-39.9) 04/13/2022   Stress incontinence 11/08/2021   Status post revision of total replacement of right knee 05/07/2020   Failed total knee, right, 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/2020   History of total right knee replacement 01/28/2020   Vitamin D  deficiency 08/29/2018   Insulin  resistance 08/29/2018   Other specified glaucoma 08/14/2018   Incontinence of urine in female 11/07/2017   Vertigo 11/07/2017   Left knee pain 10/25/2016   Genetic testing 08/08/2016   Hypothyroidism 12/02/2015   Anxiety and depression 12/02/2015   OAB (overactive bladder) 12/02/2015   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/02/2015   BMI 37.0-37.9, adult 12/02/2015    ONSET DATE: 10/10/125  REFERRING DIAG: I63.9 (ICD-10-CM) - CVA (cerebral vascular accident) (HCC)  THERAPY DIAG:  Dizziness and giddiness  Unsteadiness on feet  Rationale for Evaluation and Treatment: Rehabilitation  SUBJECTIVE:                                                                                                                                                                                             SUBJECTIVE STATEMENT: Got the Zofran  Thursday and didn't need it all weekend.  Took it this morning as I felt a little nauseated when I woke up. Haven't been sleeping as well as my husband has been painting in our room.    Pt accompanied by: significant other  PERTINENT HISTORY: Anemia, anxiety, L breast CA s/p B mastectomy, depression, HLD, HTN, B TKA  PAIN:  Are you having pain? No  PRECAUTIONS: Fall  RED FLAGS: None   WEIGHT BEARING  RESTRICTIONS: No  FALLS: Has patient fallen in last 6 months? Yes. Number of falls 1  LIVING ENVIRONMENT: Lives with: lives with their spouse Lives in: House/apartment Stairs: 2 step to enter with handrail; 2 story home with bedroom on 1st floor Has following equipment at home: Single point cane and Walker - 2 wheeled  PLOF: Independent  PATIENT GOALS: improve dizziness  OBJECTIVE:   Rates 0/10 symptoms upon start of PT today.  TODAY'S TREATMENT: 04/29/2024 Activity Comments  Pt performs seated head turns/nods and seated forward flexion to upright sitting  No symtpoms  Standing at counter: Romberg EO/EC head turns/nods Partial tandem EO/EC 15-30 sec hold   Forward/back walking along counter Tandem gait along counter Stagger stance forward/back rocking   DGI 10/24 Scores <19/24 indicate increased fall risk          [facility] PT Assessment - 04/29/24 1045       Standardized Balance Assessment   Standardized Balance Assessment Dynamic Gait Index      Dynamic Gait Index   Level Surface Moderate Impairment   14.84 sec   Change in Gait Speed Moderate Impairment    Gait with Horizontal Head Turns Moderate Impairment    Gait with Vertical Head Turns Moderate Impairment    Gait and Pivot Turn Mild Impairment    Step Over Obstacle Moderate Impairment    Step Around Obstacles Mild Impairment    Steps Moderate Impairment    Total Score 10         Access Code: [email or web address] URL: [email or web address] Date: 04/29/2024 Prepared by: [facility] - [facility]  Program Notes perform with husband's supervision for safety  Exercises - Supine to Left Sidelying Vestibular Habituation  - 1 x daily - 5 x weekly - 2 sets -  3-5 reps - Supine to Right Sidelying Vestibular Habituation  - 1 x daily - 5 x weekly - 2 sets - 3-5 reps - Seated Head Nods Vestibular Habituation  - 1 x daily - 5 x weekly - 2 sets - 30 sec hold - Feet Together Balance at [employer] Eyes Closed  - 7 x weekly - 3 sets - 30 sec hold - Corner Balance Feet Together: Eyes Open With Head Turns  - 1 x daily - 7 x weekly - 3 sets - 30 sec hold - Corner Balance Feet Together: Eyes Closed With Head Turns  - 1 x daily - 7 x weekly - 3 sets - 30 sec hold - Backward Walking with Counter Support  - 1 x daily - 5 x weekly - 1 sets - 3-5 reps - Staggered Stance Forward Backward Weight Shift with Counter Support  - 1 x daily - 7 x weekly - 2 sets - 10 reps   PATIENT EDUCATION: Education details: Updates to HEP- see above, rationale for mobility exercises, progressing general mobility; educated pt in ways to safely perform HEP and if she walks outside, to have husband assist Person educated: Patient and Spouse Education method: Explanation Education comprehension: verbalized understanding       Note: Objective measures were completed at Evaluation unless otherwise noted.  DIAGNOSTIC FINDINGS: 03/14/24 brain MRI: Acute 6 mm right splenium lacunar infarct. No acute hemorrhage or mass effect. 2. Underlying signal changes of advanced chronic small vessel disease in the bilateral white matter and deep gray nuclei.  COGNITION: Overall cognitive status: Within functional limits for tasks assessed; husband assists with subjective report    SENSATION: Pt reports slight N/T in R foot d/t neuropathy   COORDINATION: Alternating pronation/supination: slight dysmetria on L Alternating toe tap: WNL Finger to nose: WNL  Vitals: 147/96 mmHg, 96% spO2, 80bpm     POSTURE: rounded shoulders and forward head  GAIT: Findings: Assistive device utilized:Single point cane, Level of assistance: Min A, and Comments: requires assistance for balance from husband   VESTIBULAR ASSESSMENT   GENERAL OBSERVATION: pt wears glasses but not sure what kind   OCULOMOTOR EXAM: Eye alignment: L eye elevated in orbit   Ocular ROM: limited R eye abduction   Spontaneous Nystagmus:  absent   Gaze-Induced Nystagmus: absent   Smooth Pursuits: difficulty tracking R and c/o diplopia at 45 degrees gaze to each side   Saccades: disconjugate eye movement with R eye overshooting/missing target repeatedly and c/o diplopia with R gaze   Convergence/Divergence: 12 inches c/o diplopia cm    VESTIBULAR - OCULAR REFLEX:    Slow VOR: Normal   VOR Cancellation: Unable to Maintain Gaze; c/o diplopia   Head-Impulse Test: HIT Right: negative HIT Left: negative    POSITIONAL TESTING:   Right Sidelying: strong anxiety response and c/o dizziness with nystagmus but unable to identify d/t pt closing eyes Left Sidelying: negative  TREATMENT DATE: 03/24/24    PATIENT EDUCATION: Education details: prognosis, POC, exam findings and need for MD f/u to address new oculomotor findings that were not documented in the hospital  Person educated: Patient and Spouse Education method: Explanation Education comprehension: verbalized understanding  HOME EXERCISE PROGRAM: Not yet initiated    GOALS: Goals reviewed with patient? Yes  SHORT TERM GOALS: Target date: 04/21/2024  Patient to be independent with initial HEP. Baseline: HEP initiated Goal status: IN PROGRESS    LONG TERM GOALS: Target date: 05/19/2024  Patient to be independent with advanced HEP. Baseline: Not yet initiated  Goal status: IN PROGRESS  Patient to report 0/10 dizziness with standing vertical and horizontal VOR for 30 seconds. Baseline: Unable Goal status: IN PROGRESS  Patient will report 0/10 dizziness with bed mobility.  Baseline: Symptomatic  Goal status: IN PROGRESS  Patient to demonstrate ambulation for 374ft with LRAD and supervision for safety.  Baseline: min A d/t imbalance  Goal status: IN PROGRESS  Patient to score at least 15/24 on DGI in order to decrease risk of  falls. Baseline: deferred d/t safety 04/10/24; 10/24 04/29/24 Goal status: IN PROGRESS  Patient to score at least 40/56 on Berg in order to decrease risk of falls. Baseline: 34/56 04/10/24 Goal status: IN PROGRESS   ASSESSMENT:  CLINICAL IMPRESSION: Pt presents today with no symptoms of dizziness or spinning; she did take her first Zofran  earlier today due to mild nausea upon waking up.  Skilled PT session focused on reviewing and progressing motion sensitivity, working on static and dynamic balance, assessing DGI.  DGI score 10/24 is indicative of fall risk and pt is slowed by all mobility activities on DGI.  She tolerates well standing and dynamic balance activities at counter, and no symptoms are brought on throughout session. She seems pleased with no complaints at end of session.  Pt will continue to benefit from skilled PT towards goals for improved functional mobility and decreased fall risk.  OBJECTIVE IMPAIRMENTS: Abnormal gait, decreased activity tolerance, decreased balance, decreased coordination, difficulty walking, and dizziness.   ACTIVITY LIMITATIONS: carrying, lifting, bending, sitting, standing, squatting, sleeping, stairs, transfers, bed mobility, bathing, toileting, dressing, reach over head, hygiene/grooming, locomotion level, and caring for others  PARTICIPATION LIMITATIONS: meal prep, cleaning, laundry, driving, shopping, community activity, yard work, and church  PERSONAL FACTORS: Age, Behavior pattern, Past/current experiences, Time since onset of injury/illness/exacerbation, and 3+ comorbidities: Anemia, anxiety, L breast CA s/p B mastectomy, depression, HLD, HTN, B TKA are also affecting patient's functional outcome.   REHAB POTENTIAL: Good  CLINICAL DECISION MAKING: Evolving/moderate complexity  EVALUATION COMPLEXITY: Moderate  PLAN:  PT FREQUENCY: 1-2x/week  PT DURATION: 8 weeks  PLANNED INTERVENTIONS: 97164- PT Re-evaluation, 97750- Physical Performance  Testing, 97110-Therapeutic exercises, 97530- Therapeutic activity, [id number]- Neuromuscular re-education, 97535- Self Care, 02859- Manual therapy, [id number]- Gait training, [phone]- Canalith repositioning, Patient/Family education, Balance training, Stair training, Taping, Vestibular training, Cryotherapy, and Moist heat  PLAN FOR NEXT SESSION:  Check addition to HEP, continue to work on balance, mobility exercises;   Recheck positional testing as needed(*R roll for canal/cupulo?) and treat      Greig Anon, PT 04/29/24 10:51 AM Phone: [phone] Fax: [phone]  [facility] at [facility] [address] [place], [address] [place], [place] [address] Phone # [phone] Fax # [phone]

## 2024-04-29 NOTE — Therapy (Signed)
 OUTPATIENT OCCUPATIONAL THERAPY NEURO  Treatment Note  Patient Name: Vanessa Christensen MRN: [id number] DOB:[birth year], 73 y.o., female Today's Date: 04/29/2024  PCP: Mahlon Comer BRAVO, MD REFERRING PROVIDER:   Douglass Kenney NOVAK, FNP    END OF SESSION:  OT End of Session - 04/29/24 1111     Visit Number 2    Number of Visits 9    Date for Recertification  06/06/24    Authorization Type Medicare Part A&B / BCBS Fed 2025    OT Start Time 1106    OT Stop Time 1149    OT Time Calculation (min) 43 min           Past Medical History:  Diagnosis Date   Anemia    hx of  iron deficient   Anxiety    pt denies   Arthritis    hands and feet   Cancer (HCC) 10/04/2006   breast    left   Cataracts, both eyes    Depression    Depression    Diverticulosis of colon    GERD (gastroesophageal reflux disease)    Glaucoma    Hyperlipidemia    Hypertension    no meds   Joint pain    Knee pain    Obesity    OSA on CPAP    Osteoarthritis    Sleep apnea    no CPAP- no longer needed d/t weight loss   Thyroid  activity decreased    Past Surgical History:  Procedure Laterality Date   BREAST SURGERY Bilateral    CATARACT EXTRACTION     COLONOSCOPY     GLAUCOMA REPAIR     MASTECTOMY, RADICAL Bilateral    neulasta induced sterile abscesses     THYROIDECTOMY, PARTIAL     TONSILLECTOMY     TOTAL KNEE ARTHROPLASTY Right    TOTAL KNEE ARTHROPLASTY Left 10/27/2022   Procedure: LEFT TOTAL KNEE ARTHROPLASTY;  Surgeon: Vernetta Lonni GRADE, MD;  Location: [facility];  Service: Orthopedics;  Laterality: Left;   TOTAL KNEE REVISION Right 05/07/2020   Procedure: RIGHT TOTAL KNEE REVISION ARTHROPLASTY;  Surgeon: Vernetta Lonni GRADE, MD;  Location: [facility];  Service: Orthopedics;  Laterality: Right;   Patient Active Problem List   Diagnosis Date Noted   CVA (cerebral vascular accident) (HCC) 03/14/2024   Cellulitis of right leg 12/21/2023   Onychomycosis 12/21/2023   Atherosclerosis of  artery 03/12/2023   Adrenal nodule 11/16/2022   Thyroid  nodule 11/16/2022   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   Generalized obesity 10/05/2022   Memory loss 06/12/2022   Essential hypertension 04/13/2022   Obesity (BMI 30-39.9) 04/13/2022   Stress incontinence 11/08/2021   Status post revision of total replacement of right knee 05/07/2020   Failed total knee, right, 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/2020   History of total right knee replacement 01/28/2020   Vitamin D  deficiency 08/29/2018   Insulin  resistance 08/29/2018   Other specified glaucoma 08/14/2018   Incontinence of urine in female 11/07/2017   Vertigo 11/07/2017   Left knee pain 10/25/2016   Genetic testing 08/08/2016   Hypothyroidism 12/02/2015   Anxiety and depression 12/02/2015   OAB (overactive bladder) 12/02/2015   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/02/2015   BMI 37.0-37.9, adult 12/02/2015    ONSET DATE: referral 04/14/24  REFERRING DIAG: H51.9 (ICD-10-CM) - Abnormal eye movements   THERAPY DIAG:  Visuospatial deficit  Other lack of coordination  Other symptoms and signs involving the nervous system  Rationale for Evaluation and Treatment: Rehabilitation  SUBJECTIVE:   SUBJECTIVE STATEMENT: Pt reports not dizziness for the past week.  Pt accompanied by: self and significant other  PERTINENT HISTORY: 73 y.o. female with medical history significant of hypertension, hyperlipidemia who presented to the ED due to dizziness.  Patient had been dizzy for 24 hours which has been progressively worsening and with vomiting.  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  On PT eval pt with disconjugate gaze with R not tracking with L eye.  PMH/o Anemia, anxiety, L breast CA s/p B mastectomy, depression, HLD, HTN, B TKA   PRECAUTIONS: Fall  WEIGHT BEARING RESTRICTIONS: No  PAIN:  Are you having pain? No  FALLS: Has  patient fallen in last 6 months? Yes. Number of falls 2 - one in the backyard and one entering restaurant   LIVING ENVIRONMENT: Lives with: lives with their spouse Lives in: House/apartment Stairs: 2 step to enter with handrail; 2 story home with bedroom on 1st floor Has following equipment at home: Single point cane and Walker - 2 wheeled, built in seat in shower that she does not use, grab bar next to the toilet, in plans for remodel in bathroom to elevate toilet seat  PLOF: Independent and Independent with basic ADLs; would ask for assistance with balance when navigating curbs in the community and if dropping item  PATIENT GOALS: to walk without tripping  OBJECTIVE:  Note: Objective measures were completed at Evaluation unless otherwise noted.  HAND DOMINANCE: Right  ADLs: Transfers/ambulation related to ADLs: Mod I, will occasionally use walker when vertigo is really bad Grooming: Mod I UB Dressing: Mod I LB Dressing: husband assisting with donning socks s/p knee surgery Toileting: Mod I Bathing: Mod I Tub Shower transfers: Mod I   IADLs:  Pt is doing majority of the cooking and will do some household tasks but feels that she doesn't have the energy.  Pt reports that the lighting in stores will effect her.   MOBILITY STATUS: Hx of falls and occasional hand held assist especially when in the community, has used RW when experiencing more vertigo symptoms  POSTURE COMMENTS:  rounded shoulders and forward head   ACTIVITY TOLERANCE: Activity tolerance: decreased endurance s/p CVA  FUNCTIONAL OUTCOME MEASURES: PSFS   UPPER EXTREMITY ROM:  WFL bilaterally   UPPER EXTREMITY MMT:   Grossly 4+ to 5 overall   COORDINATION: 9 Hole Peg test: Right: 29.63 sec; Left: 33.28 sec  SENSATION: Pt reports slight N/T in R foot d/t neuropathy   COGNITION: Overall cognitive status: Within functional limits for tasks assessed  VISION: Subjective report: wears glasses 80% of the  time Baseline vision: Wears glasses all the time Visual history: cataracts removal  VISION ASSESSMENT: Tracking/Visual pursuits: Right eye does not track laterally and Decreased smoothness with horizontal tracking; Diplopia with vertical tracking upwards Convergence: Impaired: reporting diplopia 6-7 from nose Visual Fields: no apparent deficits  Completed Convergence Insufficiency Symptom Survey (CISS): 21/56 21 or higher is suggestive of convergence insufficiency  Patient has difficulty with following activities due to following visual impairments: reading, tying shoes  PERCEPTION: WFL  PRAXIS: WFL  TREATMENT DATE:  04/29/24 Bell cancellation: pt locating 32/35 bells in 5:18.  Pt initially scanning linearly but then transitioning to vertically.  Pt omitting one at far left side of picture and other 2 in the middle of picture.   Trail Making: completed trail A in 42.69 sec picking up pen x1, completed trail B in 1:13 with no errors and picking up pen x2.   Vision HEP: OT providing pt with hidden picture, eye spy, and linear scanning handouts.  OT educating on Lumosity and/or other visual game apps to focus on visual scanning and/or attention. OT educating pt on carryover of each activity to functional tasks, particularly with reading.  OT educating on use of bookmark or card for line follow to decrease visual distraction and increase attention.  Discussed use of large print books and/or increasing font on computer. OT instructed pt in horizontal smooth pursuits and convergence HEP with use of note card with letter A on it for focus.  Provided with handouts.    04/24/24 Educated on potential benefit of exercises to address convergence insufficiency as well as disconjugate gaze with R eye.  Pt also reporting increased difficulty with reading even before CVA,  therefore pt may benefit from adaptive strategies and/or equipment to aid in reading.       PATIENT EDUCATION: Education details: visual scanning activities, HEP, and educating on use of line follow, increased font, and  Person educated: Patient and Spouse Education method: Explanation Education comprehension: verbalized understanding and needs further education  HOME EXERCISE PROGRAM: Access Code: [email or web address] URL: [email or web address] Date: 04/29/2024 Prepared by: [facility] - Outpatient  Rehab - [facility]  Exercises - Seated Horizontal Smooth Pursuit  - 2 x daily - 10 reps - Seated Proximal-Distal Smooth Pursuit  - 2 x daily - 10 reps   GOALS: Goals reviewed with patient? Yes  SHORT TERM GOALS: Target date: 05/16/24  Pt will independently return demo of vision HEP. Baseline: new to OPOT Goal status: INITIAL  2.  Pt will verbalize understanding of energy conservation strategies and recall 3 in use. Baseline: easily fatigued and decreased activity tolerance Goal status: INITIAL  3.  Pt will independently recall or demonstrate at least 3 vision compensation strategies. Baseline: disconjugate gaze/convergence insufficiency, and low vision Goal status: INITIAL   LONG TERM GOALS: Target date: 06/06/24  Patient will report at least two-point increase in average PSFS score or at least three-point increase in a single activity score indicating functionally significant improvement given minimum detectable change. Baseline: 4.3 Goal status: INITIAL  2.  Pt will report ability to read 15 mins with improved ease with use of AE/strategies PRN. Baseline: reports losing place when reading, diplopia during reaching Goal status: INITIAL  3.  Pt will report improved score on CISS to <21 for improved convergence. Baseline: 21/56 Goal status: INITIAL  4.  Pt will complete table top visual scanning activity at Mod I level with use of adaptive strategies  PRN. Baseline: TBD Goal status: INITIAL   ASSESSMENT:  CLINICAL IMPRESSION: Patient is a 73 y.o. female who was seen today for occupational therapy treatment for disconjugate gaze s/p CVA. Pt reporting diplopia with vertical gaze, especially upwards, and difficulty with focus, recall, and speed with reading.  Pt reporting that she will easily lose her place when reading and feels that print is getting smaller. Pt demonstrating sporadic scanning during Bell cancellation test, starting with linear scanning but then changing her scanning method, resulting in omitting images.  Pt receptive to education  of HEP as well as pen and paper or app activities to continue to address linear scanning and visual attention.  Pt receptive to recommendation of use of large print books, from [employer], and may benefit from further discussion and education on AE to increase font when reading.    PERFORMANCE DEFICITS: in functional skills including ADLs, IADLs, ROM, balance, endurance, decreased knowledge of precautions, decreased knowledge of use of DME, and vision and psychosocial skills including coping strategies and environmental adaptation.     PLAN:  OT FREQUENCY: 1-2x/week  OT DURATION: 6 weeks  PLANNED INTERVENTIONS: 97168 OT Re-evaluation, 97535 self care/ADL training, 02889 therapeutic exercise, 97530 therapeutic activity, 97112 neuromuscular re-education, functional mobility training, visual/perceptual remediation/compensation, psychosocial skills training, energy conservation, coping strategies training, patient/family education, and DME and/or AE instructions  RECOMMENDED OTHER SERVICES: NA  CONSULTED AND AGREED WITH PLAN OF CARE: Patient and family member/caregiver  PLAN FOR NEXT SESSION: Review linear and convergence HEP, complete table top visual scanning with peg board task, incorporate reading, educate on adaptive strategies and/or equipment to aid in low vision and diplopia   Mcgwire Dasaro,  Kolleen Ochsner, OTR/L 04/29/2024, 12:10 PM  [facility] at [facility] [address] [place], [place] [address] Phone # [phone] Fax # [phone]

## 2024-04-30 ENCOUNTER — Encounter: Payer: Self-pay | Admitting: Bariatrics

## 2024-04-30 ENCOUNTER — Ambulatory Visit (INDEPENDENT_AMBULATORY_CARE_PROVIDER_SITE_OTHER): Admitting: Bariatrics

## 2024-04-30 VITALS — BP 147/81 | HR 81 | Temp 98.0°F | Ht 62.0 in | Wt 195.0 lb

## 2024-04-30 DIAGNOSIS — R632 Polyphagia: Secondary | ICD-10-CM | POA: Diagnosis not present

## 2024-04-30 DIAGNOSIS — E785 Hyperlipidemia, unspecified: Secondary | ICD-10-CM

## 2024-04-30 DIAGNOSIS — Z6835 Body mass index (BMI) 35.0-35.9, adult: Secondary | ICD-10-CM | POA: Diagnosis not present

## 2024-04-30 MED ORDER — WEGOVY 1 MG/0.5ML ~~LOC~~ SOAJ: 1.0000 mg | SUBCUTANEOUS | 0 refills | Status: DC

## 2024-04-30 NOTE — Progress Notes (Signed)
 WEIGHT SUMMARY AND BIOMETRICS  Weight Lost Since Last Visit: 0lb  Weight Gained Since Last Visit: 1lb   Vitals Temp: 98 F (36.7 C) BP: (!) 147/81 Pulse Rate: 81 SpO2: 99 %   Anthropometric Measurements Height: 5' 2 (1.575 m) Weight: 195 lb (88.5 kg) BMI (Calculated): 35.66 Weight at Last Visit: 194lb Weight Lost Since Last Visit: 0lb Weight Gained Since Last Visit: 1lb Starting Weight: 241lb Total Weight Loss (lbs): 46 lb (20.9 kg)   Body Composition  Body Fat %: 46.1 % Fat Mass (lbs): 90 lbs Muscle Mass (lbs): 99.8 lbs Total Body Water  (lbs): 73 lbs Visceral Fat Rating : 15   Other Clinical Data Fasting: No Labs: No Today's Visit #: 37 Starting Date: 08/13/18    OBESITY Vanessa Christensen is here to discuss her progress with her obesity treatment plan along with follow-up of her obesity related diagnoses.    Nutrition Plan: the Category 3 plan - 40% adherence.  Current exercise: Physical therapy   Interim History:  She is up 1 lb since her last visit. She had a mini-stroke and doing Pt and Ot. She is on Plavix . She is craving carbohydrates.  Eating all of the food on the plan., Protein intake is as prescribed, and Water  intake is adequate.   Pharmacotherapy: Vanessa Christensen is on Wegovy  1.0 mg SQ weekly Adverse side effects: None Hunger is moderately controlled.  Cravings are moderately controlled.  Assessment/Plan:   Vanessa Christensen endorses excessive hunger.  Medication(s): Wegovy  Effects of medication (appetite):  moderately controlled. Cravings are moderately controlled.   Plan: Medication(s): Wegovy  1.0 mg SQ weekly Will increase water , protein and fiber to help assuage hunger.  Will minimize foods that have a high glucose index/load to minimize reactive hypoglycemia.  Will minimize sweets.  Will do more meal planning.   Hyperlipidemia LDL  is at goal. Medication(s): Crestor   Cardiovascular risk factors: advanced age (older than 45 for men, 52 for women), hypertension, obesity (BMI >= 30 kg/m2), and sedentary lifestyle  Lab Results  Component Value Date   CHOL 122 03/15/2024   HDL 53 03/15/2024   LDLCALC 59 03/15/2024   TRIG 52 03/15/2024   CHOLHDL 2.3 03/15/2024   Lab Results  Component Value Date   ALT 18 03/19/2024   AST 18 03/19/2024   ALKPHOS 55 03/19/2024   BILITOT 0.5 03/19/2024   The ASCVD Risk score (Arnett DK, et al., 2019) failed to calculate for the following reasons:   Risk score cannot be calculated because patient has a medical history suggesting prior/existing ASCVD  Plan:  Continue statin.  Will avoid all trans fats.  Will read labels Will minimize saturated fats except the following: low fat meats in moderation, diary, and limited dark chocolate.  Will continue exercise.    Morbid Obesity: Current BMI BMI (Calculated): 35.66   Pharmacotherapy Plan Continue and refill  Wegovy  1.0 mg  SQ weekly  Vanessa Christensen is currently in the action stage of change. As such, her goal is to continue with weight loss efforts.  She has agreed to the Category 3 plan.  Exercise goals: Older adults should do exercises that maintain or improve balance if they are at risk of falling.   Behavioral modification strategies: increasing lean protein intake, no meal skipping, meal planning , increase water  intake, better snacking choices, planning for success, increasing vegetables, keep healthy foods in the home, weigh protein portions, and measure portion sizes.  [name] has agreed to follow-up with our clinic in 4 weeks.    Objective:   VITALS: Per patient if applicable, see vitals. GENERAL: Alert and in no acute distress. CARDIOPULMONARY: No increased WOB. Speaking in clear sentences.  PSYCH: Pleasant and cooperative. Speech normal rate and rhythm. Affect is appropriate. Insight and judgement are appropriate. Attention  is focused, linear, and appropriate.  NEURO: Oriented as arrived to appointment on time with no prompting.   Attestation Statements:   This was prepared with the assistance of Engineer, Civil (consulting).  Occasional wrong-word or sound-a-like substitutions may have occurred due to the inherent limitations of voice recognition   Clayborne Daring, DO

## 2024-05-03 ENCOUNTER — Other Ambulatory Visit: Payer: Self-pay | Admitting: Family Medicine

## 2024-05-03 DIAGNOSIS — E038 Other specified hypothyroidism: Secondary | ICD-10-CM

## 2024-05-05 ENCOUNTER — Encounter: Payer: Self-pay | Admitting: Physical Therapy

## 2024-05-05 ENCOUNTER — Ambulatory Visit: Attending: Internal Medicine | Admitting: Physical Therapy

## 2024-05-05 DIAGNOSIS — R29818 Other symptoms and signs involving the nervous system: Secondary | ICD-10-CM | POA: Diagnosis present

## 2024-05-05 DIAGNOSIS — R278 Other lack of coordination: Secondary | ICD-10-CM | POA: Diagnosis present

## 2024-05-05 DIAGNOSIS — R41844 Frontal lobe and executive function deficit: Secondary | ICD-10-CM | POA: Insufficient documentation

## 2024-05-05 DIAGNOSIS — R2681 Unsteadiness on feet: Secondary | ICD-10-CM | POA: Insufficient documentation

## 2024-05-05 DIAGNOSIS — R41842 Visuospatial deficit: Secondary | ICD-10-CM | POA: Diagnosis present

## 2024-05-05 DIAGNOSIS — M6281 Muscle weakness (generalized): Secondary | ICD-10-CM | POA: Diagnosis present

## 2024-05-05 DIAGNOSIS — R42 Dizziness and giddiness: Secondary | ICD-10-CM | POA: Insufficient documentation

## 2024-05-05 DIAGNOSIS — R262 Difficulty in walking, not elsewhere classified: Secondary | ICD-10-CM | POA: Insufficient documentation

## 2024-05-05 NOTE — Therapy (Signed)
 OUTPATIENT PHYSICAL THERAPY NEURO TREATMENT   Patient Name: Vanessa Christensen MRN: [id number] DOB:1951/02/16, 73 y.o., female Today's Date: 05/05/2024   PCP:    Mahlon Comer BRAVO, MD   REFERRING PROVIDER: Raenelle Donalda HERO, MD  END OF SESSION:  PT End of Session - 05/05/24 1018     Visit Number 9    Number of Visits 17    Date for Recertification  05/19/24    Authorization Type Medicare/BCBS Federal    PT Start Time 1018    PT Stop Time 1057    PT Time Calculation (min) 39 min    Equipment Utilized During Treatment Gait belt    Activity Tolerance Patient tolerated treatment well    Behavior During Therapy WFL for tasks assessed/performed                 Past Medical History:  Diagnosis Date   Anemia    hx of  iron deficient   Anxiety    pt denies   Arthritis    hands and feet   Cancer (HCC) 10/04/2006   breast    left   Cataracts, both eyes    Depression    Depression    Diverticulosis of colon    GERD (gastroesophageal reflux disease)    Glaucoma    Hyperlipidemia    Hypertension    no meds   Joint pain    Knee pain    Obesity    OSA on CPAP    Osteoarthritis    Sleep apnea    no CPAP- no longer needed d/t weight loss   Thyroid  activity decreased    Past Surgical History:  Procedure Laterality Date   BREAST SURGERY Bilateral    CATARACT EXTRACTION     COLONOSCOPY     GLAUCOMA REPAIR     MASTECTOMY, RADICAL Bilateral    neulasta induced sterile abscesses     THYROIDECTOMY, PARTIAL     TONSILLECTOMY     TOTAL KNEE ARTHROPLASTY Right    TOTAL KNEE ARTHROPLASTY Left 10/27/2022   Procedure: LEFT TOTAL KNEE ARTHROPLASTY;  Surgeon: Vernetta Lonni GRADE, MD;  Location: [facility];  Service: Orthopedics;  Laterality: Left;   TOTAL KNEE REVISION Right 05/07/2020   Procedure: RIGHT TOTAL KNEE REVISION ARTHROPLASTY;  Surgeon: Vernetta Lonni GRADE, MD;  Location: [facility];  Service: Orthopedics;  Laterality: Right;   Patient Active Problem List    Diagnosis Date Noted   CVA (cerebral vascular accident) (HCC) 03/14/2024   Cellulitis of right leg 12/21/2023   Onychomycosis 12/21/2023   Atherosclerosis of artery 03/12/2023   Adrenal nodule 11/16/2022   Thyroid  nodule 11/16/2022   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   Generalized obesity 10/05/2022   Memory loss 06/12/2022   Essential hypertension 04/13/2022   Obesity (BMI 30-39.9) 04/13/2022   Stress incontinence 11/08/2021   Status post revision of total replacement of right knee 05/07/2020   Failed total knee, right, 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/2020   History of total right knee replacement 01/28/2020   Vitamin D  deficiency 08/29/2018   Insulin  resistance 08/29/2018   Other specified glaucoma 08/14/2018   Incontinence of urine in female 11/07/2017   Vertigo 11/07/2017   Left knee pain 10/25/2016   Genetic testing 08/08/2016   Hypothyroidism 12/02/2015   Anxiety and depression 12/02/2015   OAB (overactive bladder) 12/02/2015   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/02/2015   BMI 37.0-37.9, adult 12/02/2015    ONSET DATE: 10/10/125  REFERRING  DIAG: I63.9 (ICD-10-CM) - CVA (cerebral vascular accident) (HCC)  THERAPY DIAG:  Dizziness and giddiness  Unsteadiness on feet  Rationale for Evaluation and Treatment: Rehabilitation  SUBJECTIVE:                                                                                                                                                                                             SUBJECTIVE STATEMENT: Had a fabulous Thanksgiving.  Sunday morning-had an episode of bending down to pick up shoes and had a bout of nausea that passed.  Did have one episode of nausea/vomiting with eating out last week.  Have been taking the Zofran  when I wake up if I feel at all nauseated.    Pt accompanied by: significant other  PERTINENT HISTORY: Anemia, anxiety, L breast CA s/p B mastectomy,  depression, HLD, HTN, B TKA  PAIN:  Are you having pain? No  PRECAUTIONS: Fall  RED FLAGS: None   WEIGHT BEARING RESTRICTIONS: No  FALLS: Has patient fallen in last 6 months? Yes. Number of falls 1  LIVING ENVIRONMENT: Lives with: lives with their spouse Lives in: House/apartment Stairs: 2 step to enter with handrail; 2 story home with bedroom on 1st floor Has following equipment at home: Single point cane and Walker - 2 wheeled  PLOF: Independent  PATIENT GOALS: improve dizziness  OBJECTIVE:   No c/o dizziness, some unsteadiness.  TODAY'S TREATMENT: 05/05/2024 Activity Comments  Review of updates to HEP: -backwards walking -stagger stance rock forward/back   Stagger stance rocking with reach and look up/look down, 5 reps each side 4/10 symptoms, settles after several minutes  Standing at counter: Romberg EO/EC head turns/nods Partial tandem EO/EC 15-30 sec hold Unsteadiness EO and head nods  Tandem gait Sidestepping UE support  Forward/back stepping with head motions Increase in symptoms to borderline nausea         Access Code: [email or web address] URL: [email or web address] Date: 04/29/2024 Prepared by: [facility] - Outpatient  Rehab - [facility]  Program Notes perform with husband's supervision for safety  Exercises - Supine to Left Sidelying Vestibular Habituation  - 1 x daily - 5 x weekly - 2 sets - 3-5 reps - Supine to Right Sidelying Vestibular Habituation  - 1 x daily - 5 x weekly - 2 sets - 3-5 reps - Seated Head Nods Vestibular Habituation  - 1 x daily - 5 x weekly - 2 sets - 30 sec hold - Feet Together Balance at [employer] Eyes Closed  - 7 x weekly - 3 sets - 30 sec hold - Corner Balance Feet Together: Eyes Open With Head Turns  -  1 x daily - 7 x weekly - 3 sets - 30 sec hold - Corner Balance Feet Together: Eyes Closed With Head Turns  - 1 x daily - 7 x weekly - 3 sets - 30 sec hold - Backward Walking with Counter Support  - 1 x daily -  5 x weekly - 1 sets - 3-5 reps - Staggered Stance Forward Backward Weight Shift with Counter Support  - 1 x daily - 7 x weekly - 2 sets - 10 reps   PATIENT EDUCATION: Education details: Continue current HEP, discussed upcoming MD visit and discussed pt/husband writing down questions for MD Person educated: Patient and Spouse Education method: Explanation Education comprehension: verbalized understanding       Note: Objective measures were completed at Evaluation unless otherwise noted.  DIAGNOSTIC FINDINGS: 03/14/24 brain MRI: Acute 6 mm right splenium lacunar infarct. No acute hemorrhage or mass effect. 2. Underlying signal changes of advanced chronic small vessel disease in the bilateral white matter and deep gray nuclei.  COGNITION: Overall cognitive status: Within functional limits for tasks assessed; husband assists with subjective report    SENSATION: Pt reports slight N/T in R foot d/t neuropathy   COORDINATION: Alternating pronation/supination: slight dysmetria on L Alternating toe tap: WNL Finger to nose: WNL  Vitals: 147/96 mmHg, 96% spO2, 80bpm     POSTURE: rounded shoulders and forward head  GAIT: Findings: Assistive device utilized:Single point cane, Level of assistance: Min A, and Comments: requires assistance for balance from husband   VESTIBULAR ASSESSMENT   GENERAL OBSERVATION: pt wears glasses but not sure what kind   OCULOMOTOR EXAM: Eye alignment: L eye elevated in orbit   Ocular ROM: limited R eye abduction   Spontaneous Nystagmus: absent   Gaze-Induced Nystagmus: absent   Smooth Pursuits: difficulty tracking R and c/o diplopia at 45 degrees gaze to each side   Saccades: disconjugate eye movement with R eye overshooting/missing target repeatedly and c/o diplopia with R gaze   Convergence/Divergence: 12 inches c/o diplopia cm    VESTIBULAR - OCULAR REFLEX:    Slow VOR: Normal   VOR Cancellation: Unable to Maintain Gaze; c/o  diplopia   Head-Impulse Test: HIT Right: negative HIT Left: negative    POSITIONAL TESTING:   Right Sidelying: strong anxiety response and c/o dizziness with nystagmus but unable to identify d/t pt closing eyes Left Sidelying: negative                                                                                                                               TREATMENT DATE: 03/24/24    PATIENT EDUCATION: Education details: prognosis, POC, exam findings and need for MD f/u to address new oculomotor findings that were not documented in the hospital  Person educated: Patient and Spouse Education method: Explanation Education comprehension: verbalized understanding  HOME EXERCISE PROGRAM: Not yet initiated    GOALS: Goals reviewed with patient? Yes  SHORT TERM GOALS: Target date:  04/21/2024  Patient to be independent with initial HEP. Baseline: HEP initiated Goal status: IN PROGRESS    LONG TERM GOALS: Target date: 05/19/2024  Patient to be independent with advanced HEP. Baseline: Not yet initiated  Goal status: IN PROGRESS  Patient to report 0/10 dizziness with standing vertical and horizontal VOR for 30 seconds. Baseline: Unable Goal status: IN PROGRESS  Patient will report 0/10 dizziness with bed mobility.  Baseline: Symptomatic  Goal status: IN PROGRESS  Patient to demonstrate ambulation for 34ft with LRAD and supervision for safety.  Baseline: min A d/t imbalance  Goal status: IN PROGRESS  Patient to score at least 15/24 on DGI in order to decrease risk of falls. Baseline: deferred d/t safety 04/10/24; 10/24 04/29/24 Goal status: IN PROGRESS  Patient to score at least 40/56 on Berg in order to decrease risk of falls. Baseline: 34/56 04/10/24 Goal status: IN PROGRESS   ASSESSMENT:  CLINICAL IMPRESSION: Pt presents today with overall improvements, but still several episodes of near nausea with bending down, looking down to pick up shoes. Skilled PT  session focused on review of HEP and continued standing dynamic activities to work towards habituation of motion.  She does have more symptoms brought on with vertical head motions.  It briefly settles, then nausea comes on even after rest.  Encouraged pt to discuss these ongoing symptoms with neurologist at visit tomorrow.   Pt will continue to benefit from skilled PT towards goals for improved functional mobility and decreased fall risk.  OBJECTIVE IMPAIRMENTS: Abnormal gait, decreased activity tolerance, decreased balance, decreased coordination, difficulty walking, and dizziness.   ACTIVITY LIMITATIONS: carrying, lifting, bending, sitting, standing, squatting, sleeping, stairs, transfers, bed mobility, bathing, toileting, dressing, reach over head, hygiene/grooming, locomotion level, and caring for others  PARTICIPATION LIMITATIONS: meal prep, cleaning, laundry, driving, shopping, community activity, yard work, and church  PERSONAL FACTORS: Age, Behavior pattern, Past/current experiences, Time since onset of injury/illness/exacerbation, and 3+ comorbidities: Anemia, anxiety, L breast CA s/p B mastectomy, depression, HLD, HTN, B TKA are also affecting patient's functional outcome.   REHAB POTENTIAL: Good  CLINICAL DECISION MAKING: Evolving/moderate complexity  EVALUATION COMPLEXITY: Moderate  PLAN:  PT FREQUENCY: 1-2x/week  PT DURATION: 8 weeks  PLANNED INTERVENTIONS: 97164- PT Re-evaluation, 97750- Physical Performance Testing, 97110-Therapeutic exercises, 97530- Therapeutic activity, [id number]- Neuromuscular re-education, 97535- Self Care, [id number]- Manual therapy, 97116- Gait training, [phone]- Canalith repositioning, Patient/Family education, Balance training, Stair training, Taping, Vestibular training, Cryotherapy, and Moist heat  PLAN FOR NEXT SESSION:  Continue to work on balance, mobility exercises, habituation with head motions (vertical brings on most symptoms);   Recheck positional  testing as needed(*R roll for canal/cupulo?) and treat.  Ask about neurologist visit.     Greig Anon, PT 05/05/24 11:00 AM Phone: [phone] Fax: [phone]  [facility] at [facility] [address], [address] [place], [place] [address] Phone # [phone] Fax # [phone]

## 2024-05-06 ENCOUNTER — Encounter: Payer: Self-pay | Admitting: Neurology

## 2024-05-06 ENCOUNTER — Ambulatory Visit (INDEPENDENT_AMBULATORY_CARE_PROVIDER_SITE_OTHER): Admitting: Neurology

## 2024-05-06 VITALS — BP 148/86 | HR 73 | Ht 62.0 in | Wt 200.5 lb

## 2024-05-06 DIAGNOSIS — I6381 Other cerebral infarction due to occlusion or stenosis of small artery: Secondary | ICD-10-CM | POA: Diagnosis not present

## 2024-05-06 DIAGNOSIS — R42 Dizziness and giddiness: Secondary | ICD-10-CM | POA: Diagnosis not present

## 2024-05-06 NOTE — Progress Notes (Signed)
 [facility]  PATIENT: Zissy Hamlett DOB: 1951/01/26  REQUESTING CLINICIAN: Jerri Pfeiffer, MD HISTORY FROM: Patient/Husband and chart review  REASON FOR VISIT: Splenium infarct    HISTORICAL  CHIEF COMPLAINT:  Chief Complaint  Patient presents with   RM13/STROKE    Pt is here with her Husband. Pt states she has been doing fine since her stroke. Pt states that she is still doing PT and OT.     HISTORY OF PRESENT ILLNESS:  Discussed the use of AI scribe software for clinical note transcription with the patient, who gave verbal consent to proceed.  Karen Huhta is a 73 year old female with a history of hypertension, hyperlipidemia, hypothyroidism, depression who is presenting following a 6 mm splenium stroke.  Stroke etiology likely small vessel disease.  She is completed DAPT, aspirin  and Plavix  for 21 days and currently on aspirin  alone.    She presented to the hospital due to severe dizziness with inability to walk.  During her workup she was found to have a 6 mm splenium stroke.  She did not have any other deficit.  She was discharged with referral to PT and OT.  She continued on PT and OT and experience occasional dizziness. No falls have occurred due to dizziness, but she mentions some wobbliness. Initially, she used a walker, then a cane, and now navigates mostly without assistance.  This has improved but is not fully resolved. She experiences very occasional diplopia, especially when reading close or on glossy paper. She is due to see an ophthalmologist soon.  She reports occasional emesis, having experienced it three times since her stroke. The episodes occur suddenly, with a queasy stomach as a warning sign. She associates this with her use of Wegovy , which she has been on for about four months at a dose of 1 mg. She has lost only two to three pounds and reports a lack of regular eating due to decreased hunger from the medication.  She is nervous about  walking alone due to a past fall two years ago before knee surgery. She has not been exercising as much since her physical therapist moved away. She is concerned about her ability to walk during an upcoming trip to [place] and is considering using hiking sticks for support. She reports difficulty with head movements, particularly up and down, which can induce nausea.    OTHER MEDICAL CONDITIONS: Hypertension, Hyperlipidemia, Hypothyroidism, Depression, Insomnia    REVIEW OF SYSTEMS: Full 14 system review of systems performed and negative with exception of: As noted in the HPI   ALLERGIES: Allergies  Allergen Reactions   Neulasta [Pegfilgrastim] Other (See Comments)    Raised lumps on legs, arms - had to be excised =  Happened every time pt received Neulasta.    HOME MEDICATIONS: Outpatient Medications Prior to Visit  Medication Sig Dispense Refill   Ascorbic Acid  (VITAMIN C PO) Take 1 tablet by mouth daily.     aspirin  EC 81 MG tablet Take 1 tablet (81 mg total) by mouth daily. Swallow whole. 30 tablet 12   B Complex-C (SUPER B COMPLEX PO) Take 1 tablet by mouth daily after supper.     buPROPion  (WELLBUTRIN  XL) 150 MG 24 hr tablet TAKE 2 TABLETS BY MOUTH DAILY. 180 tablet 1   cetirizine (ZYRTEC) 10 MG tablet Take 10 mg by mouth daily.     Cholecalciferol  (VITAMIN D ) 50 MCG (2000 UT) tablet Take 2,000 Units by mouth daily.     clonazePAM  (KLONOPIN ) 0.5  MG tablet Take 1 tablet (0.5 mg total) by mouth 2 (two) times daily as needed for anxiety. 60 tablet 1   clopidogrel  (PLAVIX ) 75 MG tablet Take 1 tablet (75 mg total) by mouth daily. 21 tablet 0   diphenhydramine -acetaminophen  (TYLENOL  PM) 25-500 MG TABS tablet Take 2 tablets by mouth at bedtime as needed (sleep/pain).     dorzolamide -timolol  (COSOPT ) 2-0.5 % ophthalmic solution Place 1 drop into both eyes 2 (two) times daily.     FLUoxetine  (PROZAC ) 40 MG capsule Take 1 capsule (40 mg total) by mouth daily. 90 capsule 3   levothyroxine   (SYNTHROID ) 75 MCG tablet TAKE 1 TABLET BY MOUTH EVERY DAY 90 tablet 1   losartan  (COZAAR ) 25 MG tablet Take 1 tablet (25 mg total) by mouth daily. 30 tablet 2   meclizine  (ANTIVERT ) 12.5 MG tablet Take 1 tablet (12.5 mg total) by mouth 3 (three) times daily as needed for dizziness. 30 tablet 0   mirabegron  ER (MYRBETRIQ ) 25 MG TB24 tablet Take 1 tablet (25 mg total) by mouth daily. 30 tablet 3   ondansetron  (ZOFRAN ) 4 MG tablet Take 1 tablet (4 mg total) by mouth every 8 (eight) hours as needed for nausea or vomiting. 20 tablet 0   psyllium (METAMUCIL) 58.6 % powder Take 1 packet by mouth every other day.     rosuvastatin  (CRESTOR ) 10 MG tablet TAKE 1 TABLET BY MOUTH EVERY DAY 90 tablet 1   semaglutide -weight management (WEGOVY ) 1 MG/0.5ML SOAJ SQ injection Inject 1 mg into the skin once a week. 2 mL 0   traZODone  (DESYREL ) 50 MG tablet Take 0.5-1 tablets (25-50 mg total) by mouth at bedtime as needed. for sleep 90 tablet 2   No facility-administered medications prior to visit.    PAST MEDICAL HISTORY: Past Medical History:  Diagnosis Date   Anemia    hx of  iron deficient   Anxiety    pt denies   Arthritis    hands and feet   Cancer (HCC) 10/04/2006   breast    left   Cataracts, both eyes    Depression    Depression    Diverticulosis of colon    GERD (gastroesophageal reflux disease)    Glaucoma    Hyperlipidemia    Hypertension    no meds   Joint pain    Knee pain    Obesity    OSA on CPAP    Osteoarthritis    Sleep apnea    no CPAP- no longer needed d/t weight loss   Thyroid  activity decreased     PAST SURGICAL HISTORY: Past Surgical History:  Procedure Laterality Date   BREAST SURGERY Bilateral    CATARACT EXTRACTION     COLONOSCOPY     GLAUCOMA REPAIR     MASTECTOMY, RADICAL Bilateral    neulasta induced sterile abscesses     THYROIDECTOMY, PARTIAL     TONSILLECTOMY     TOTAL KNEE ARTHROPLASTY Right    TOTAL KNEE ARTHROPLASTY Left 10/27/2022   Procedure:  LEFT TOTAL KNEE ARTHROPLASTY;  Surgeon: Vernetta Lonni GRADE, MD;  Location: [facility];  Service: Orthopedics;  Laterality: Left;   TOTAL KNEE REVISION Right 05/07/2020   Procedure: RIGHT TOTAL KNEE REVISION ARTHROPLASTY;  Surgeon: Vernetta Lonni GRADE, MD;  Location: [facility];  Service: Orthopedics;  Laterality: Right;    FAMILY HISTORY: Family History  Problem Relation Age of Onset   CVA Mother    Cancer Mother 36       breast  cancer    Heart disease Mother    Stroke Mother    Obesity Mother    Colon polyps Father    Leukemia Father    High blood pressure Father    High Cholesterol Father    Heart disease Father    Cancer Maternal Aunt 85       breast cancer    Cancer Maternal Aunt 86       breast cancer   Colon cancer Maternal Aunt    Cancer Cousin 32       breast cancer   Esophageal cancer Neg Hx    Rectal cancer Neg Hx    Stomach cancer Neg Hx     SOCIAL HISTORY: Social History   Socioeconomic History   Marital status: Married    Spouse name: Makaia Rappa   Number of children: 2   Years of education: Not on file   Highest education level: Not on file  Occupational History   Occupation: Retired    Comment: Runner, Broadcasting/film/video  Tobacco Use   Smoking status: Former    Current packs/day: 0.00    Types: Cigarettes    Quit date: 11/02/1977    Years since quitting: 46.5   Smokeless tobacco: Never  Vaping Use   Vaping status: Never Used  Substance and Sexual Activity   Alcohol use: No   Drug use: No   Sexual activity: Not Currently    Birth control/protection: Post-menopausal  Other Topics Concern   Not on file  Social History Narrative   Enjoys taking care of grandchildren    Social Drivers of Health   Financial Resource Strain: Low Risk  (10/23/2023)   Overall Financial Resource Strain (CARDIA)    Difficulty of Paying Living Expenses: Not hard at all  Food Insecurity: No Food Insecurity (03/15/2024)   Hunger Vital Sign    Worried About Running Out of Food in the  Last Year: Never true    Ran Out of Food in the Last Year: Never true  Transportation Needs: No Transportation Needs (03/15/2024)   PRAPARE - Administrator, Civil Service (Medical): No    Lack of Transportation (Non-Medical): No  Physical Activity: Inactive (10/23/2023)   Exercise Vital Sign    Days of Exercise per Week: 0 days    Minutes of Exercise per Session: 0 min  Stress: No Stress Concern Present (10/23/2023)   [employer] of Occupational Health - Occupational Stress Questionnaire    Feeling of Stress : Not at all  Social Connections: Unknown (03/15/2024)   Social Connection and Isolation Panel    Frequency of Communication with Friends and Family: More than three times a week    Frequency of Social Gatherings with Friends and Family: Three times a week    Attends Religious Services: Patient declined    Active Member of Clubs or Organizations: Patient declined    Attends Banker Meetings: Patient declined    Marital Status: Married  Catering Manager Violence: Not At Risk (03/15/2024)   Humiliation, Afraid, Rape, and Kick questionnaire    Fear of Current or Ex-Partner: No    Emotionally Abused: No    Physically Abused: No    Sexually Abused: No    PHYSICAL EXAM  GENERAL EXAM/CONSTITUTIONAL: Vitals:  Vitals:   05/06/24 0847  BP: (!) 148/86  Pulse: 73  SpO2: 98%  Weight: 200 lb 8 oz (90.9 kg)  Height: 5' 2 (1.575 m)   Body mass index is 36.67 kg/m. Wt Readings  from Last 3 Encounters:  05/06/24 200 lb 8 oz (90.9 kg)  04/30/24 195 lb (88.5 kg)  04/02/24 194 lb (88 kg)   Patient is in no distress; well developed, nourished and groomed; neck is supple  MUSCULOSKELETAL: Gait, strength, tone, movements noted in Neurologic exam below  NEUROLOGIC: MENTAL STATUS:     05/09/2017   10:29 AM  MMSE - Mini Mental State Exam  Orientation to time 5   Orientation to Place 5   Registration 3   Attention/ Calculation 5   Recall 3    Language- name 2 objects 2   Language- repeat 1  Language- follow 3 step command 3   Language- read & follow direction 1   Write a sentence 1   Copy design 1   Total score 30      Data saved with a previous flowsheet row definition   awake, alert, oriented to person, place and time recent and remote memory intact normal attention and concentration language fluent, comprehension intact, naming intact fund of knowledge appropriate  CRANIAL NERVE:  2nd, 3rd, 4th, 6th - Visual fields full to confrontation, extraocular muscles intact, no nystagmus.  Right eye is slightly down and inward when looking straight forward, again she denies any double vision 5th - facial sensation symmetric 7th - facial strength symmetric 8th - hearing intact 9th - palate elevates symmetrically, uvula midline 11th - shoulder shrug symmetric 12th - tongue protrusion midline  MOTOR:  normal bulk and tone, full strength in the BUE, BLE  SENSORY:  normal and symmetric to light touch  COORDINATION:  finger-nose-finger, fine finger movements normal  GAIT/STATION:  Normal, unable to tandem walk     DIAGNOSTIC DATA (LABS, IMAGING, TESTING) - I reviewed patient records, labs, notes, testing and imaging myself where available.  Lab Results  Component Value Date   WBC 4.9 03/19/2024   HGB 11.3 (L) 03/19/2024   HCT 33.7 (L) 03/19/2024   MCV 86.8 03/19/2024   PLT 168.0 03/19/2024      Component Value Date/Time   NA 139 03/19/2024 1420   NA 141 12/08/2020 1150   NA 141 07/13/2016 1015   K 4.0 03/19/2024 1420   K 4.3 07/13/2016 1015   CL 103 03/19/2024 1420   CO2 26 03/19/2024 1420   CO2 24 07/13/2016 1015   GLUCOSE 82 03/19/2024 1420   GLUCOSE 89 07/13/2016 1015   BUN 12 03/19/2024 1420   BUN 21 12/08/2020 1150   BUN 17.2 07/13/2016 1015   CREATININE 0.64 03/19/2024 1420   CREATININE 0.73 07/31/2022 0857   CREATININE 0.7 07/13/2016 1015   CALCIUM  9.1 03/19/2024 1420   CALCIUM  9.6  07/13/2016 1015   PROT 6.7 03/19/2024 1420   PROT 6.8 12/08/2020 1150   PROT 6.7 07/13/2016 1015   ALBUMIN 4.4 03/19/2024 1420   ALBUMIN 4.8 (H) 12/08/2020 1150   ALBUMIN 4.0 07/13/2016 1015   AST 18 03/19/2024 1420   AST 22 07/31/2022 0857   AST 22 07/13/2016 1015   ALT 18 03/19/2024 1420   ALT 26 07/31/2022 0857   ALT 28 07/13/2016 1015   ALKPHOS 55 03/19/2024 1420   ALKPHOS 70 07/13/2016 1015   BILITOT 0.5 03/19/2024 1420   BILITOT 0.6 07/31/2022 0857   BILITOT 0.57 07/13/2016 1015   GFRNONAA >60 03/15/2024 0500   GFRNONAA >60 07/31/2022 0857   GFRAA 101 01/15/2020 1254   Lab Results  Component Value Date   CHOL 122 03/15/2024   HDL 53 03/15/2024  LDLCALC 59 03/15/2024   TRIG 52 03/15/2024   CHOLHDL 2.3 03/15/2024   Lab Results  Component Value Date   HGBA1C 4.7 (L) 03/15/2024   Lab Results  Component Value Date   VITAMINB12 804 07/27/2021   Lab Results  Component Value Date   TSH 1.12 01/15/2023    MRI Brain 03/14/2024 1. 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 No acute hemorrhage or mass effect. 2. Underlying signal changes of advanced chronic small vessel disease in the bilateral white matter and deep gray nuclei  CTA Head and Neck 03/14/2024 1. 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 No acute hemorrhage or significant mass effect. 2. No large vessel occlusion or hemodynamically significant stenosis of the head or neck vessels.  Echo 03/15/2024 1. Left ventricular ejection fraction, by estimation, is 55 to 60%. The left ventricle has normal function. The left ventricle has no regional wall motion abnormalities. Left ventricular diastolic parameters are consistent with Grade I diastolic dysfunction (impaired relaxation).  2. Right ventricular systolic function is normal. The right ventricular size is normal. Tricuspid regurgitation signal is inadequate for assessing PA pressure.  3. Left atrial  size was mildly dilated.  4. The mitral valve is degenerative. Trivial mitral valve regurgitation. Mild mitral stenosis. The mean mitral valve gradient is 4.0 mmHg with average heart rate of 87 bpm. Moderate mitral annular calcification.  5. The aortic valve has an indeterminant number of cusps. Aortic valve regurgitation is mild. Aortic valve sclerosis/calcification is present, without any evidence of aortic stenosis.  6. The inferior vena cava is dilated in size with >50% respiratory  variability, suggesting right atrial pressure of 8 mmHg     ASSESSMENT AND PLAN  73 y.o. year old female with vascular risk factor including hypertension hyperlipidemia, who is presenting after a 6 mm splenic stroke.  Stroke etiology likely small vessel disease.  She completed DAPT, aspirin  and Plavix  and currently on aspirin  alone and Crestor  10 mg.  She continue with vestibular therapy due to ongoing dizziness and vertigo symptoms.   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 Echocardiogram showed normal ejection fraction with no clots. Cholesterol levels are well controlled with rosuvastatin . Blood pressure is well controlled with medication. No further stroke prevention measures needed at this time. 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 Symptoms have improved but are not fully resolved. No current need for prism  glasses as double vision is not significant. - Continue follow up with ophthalmologist for further evaluation  Gait disturbance and balance impairment post-stroke Gait disturbance and balance impairment persist post-stroke. No falls reported, but wobbliness and  reliance on support for walking. Physical therapy ongoing. Consideration of ENT referral for further evaluation of vertigo and balance issues. - Continue physical therapy - Consider ENT referral for further evaluation of vertigo and balance issues if symptoms persist   1. Cerebrovascular accident (CVA) due to stenosis of small artery (HCC)   2. Vertigo      Patient Instructions  Continue current medications  Continue with Vestibular therapy  Consider ENT if symptoms persist  Return as needed  No orders of the defined types were placed in this encounter.  No orders of the defined types were placed in this encounter.   Return if symptoms worsen or fail to improve.   Pastor Falling, MD 05/06/2024, 9:22 AM  [facility] [address] [place], [place] [address] [phone]

## 2024-05-06 NOTE — Patient Instructions (Signed)
 Continue current medications  Continue with Vestibular therapy  Consider ENT if symptoms persist  Return as needed

## 2024-05-06 NOTE — Therapy (Signed)
 OUTPATIENT PHYSICAL THERAPY NEURO TREATMENT   Patient Name: Vanessa Christensen MRN: [id number] DOB:1950-11-25, 73 y.o., female Today's Date: 05/06/2024   PCP:    Mahlon Comer BRAVO, MD   REFERRING PROVIDER: Raenelle Donalda HERO, MD  END OF SESSION:           Past Medical History:  Diagnosis Date   Anemia    hx of  iron deficient   Anxiety    pt denies   Arthritis    hands and feet   Cancer (HCC) 10/04/2006   breast    left   Cataracts, both eyes    Depression    Depression    Diverticulosis of colon    GERD (gastroesophageal reflux disease)    Glaucoma    Hyperlipidemia    Hypertension    no meds   Joint pain    Knee pain    Obesity    OSA on CPAP    Osteoarthritis    Sleep apnea    no CPAP- no longer needed d/t weight loss   Thyroid  activity decreased    Past Surgical History:  Procedure Laterality Date   BREAST SURGERY Bilateral    CATARACT EXTRACTION     COLONOSCOPY     GLAUCOMA REPAIR     MASTECTOMY, RADICAL Bilateral    neulasta induced sterile abscesses     THYROIDECTOMY, PARTIAL     TONSILLECTOMY     TOTAL KNEE ARTHROPLASTY Right    TOTAL KNEE ARTHROPLASTY Left 10/27/2022   Procedure: LEFT TOTAL KNEE ARTHROPLASTY;  Surgeon: Vernetta Lonni GRADE, MD;  Location: [facility];  Service: Orthopedics;  Laterality: Left;   TOTAL KNEE REVISION Right 05/07/2020   Procedure: RIGHT TOTAL KNEE REVISION ARTHROPLASTY;  Surgeon: Vernetta Lonni GRADE, MD;  Location: [facility];  Service: Orthopedics;  Laterality: Right;   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   Generalized obesity 10/05/2022   Memory loss 06/12/2022   Essential hypertension 04/13/2022   Obesity (BMI 30-39.9)  04/13/2022   Stress incontinence 11/08/2021   Status post revision of total replacement of right knee 05/07/2020   Failed total knee, right, 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/2020   History of total right knee replacement 01/28/2020   Vitamin D  deficiency 08/29/2018   Insulin  resistance 08/29/2018   Other specified glaucoma 08/14/2018   Incontinence of urine in female 11/07/2017   Vertigo 11/07/2017   Left knee pain 10/25/2016   Genetic testing 08/08/2016   Hypothyroidism 12/02/2015   Anxiety and depression 12/02/2015   OAB (overactive bladder) 12/02/2015   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/02/2015   BMI 37.0-37.9, adult 12/02/2015    ONSET DATE: 10/10/125  REFERRING DIAG: I63.9 (ICD-10-CM) - CVA (cerebral vascular accident) (HCC)  THERAPY DIAG:  No diagnosis found.  Rationale for Evaluation and Treatment: Rehabilitation  SUBJECTIVE:  SUBJECTIVE STATEMENT: Had a fabulous Thanksgiving.  Sunday morning-had an episode of bending down to pick up shoes and had a bout of nausea that passed.  Did have one episode of nausea/vomiting with eating out last week.  Have been taking the Zofran  when I wake up if I feel at all nauseated.    Pt accompanied by: significant other  PERTINENT HISTORY: Anemia, anxiety, L breast CA s/p B mastectomy, depression, HLD, HTN, B TKA  PAIN:  Are you having pain? No  PRECAUTIONS: Fall  RED FLAGS: None   WEIGHT BEARING RESTRICTIONS: No  FALLS: Has patient fallen in last 6 months? Yes. Number of falls 1  LIVING ENVIRONMENT: Lives with: lives with their spouse Lives in: House/apartment Stairs: 2 step to enter with handrail; 2 story home with bedroom on 1st floor Has following equipment at home: Single point cane and Walker - 2  wheeled  PLOF: Independent  PATIENT GOALS: improve dizziness  OBJECTIVE:     TODAY'S TREATMENT: 05/07/24 Activity Comments                          No c/o dizziness, some unsteadiness.  TODAY'S TREATMENT: 05/05/2024 Activity Comments  Review of updates to HEP: -backwards walking -stagger stance rock forward/back   Stagger stance rocking with reach and look up/look down, 5 reps each side 4/10 symptoms, settles after several minutes  Standing at counter: Romberg EO/EC head turns/nods Partial tandem EO/EC 15-30 sec hold Unsteadiness EO and head nods  Tandem gait Sidestepping UE support  Forward/back stepping with head motions Increase in symptoms to borderline nausea         Access Code: [email or web address] URL: [email or web address] Date: 04/29/2024 Prepared by: [facility] - Outpatient  Rehab - [facility]  Program Notes perform with husband's supervision for safety  Exercises - Supine to Left Sidelying Vestibular Habituation  - 1 x daily - 5 x weekly - 2 sets - 3-5 reps - Supine to Right Sidelying Vestibular Habituation  - 1 x daily - 5 x weekly - 2 sets - 3-5 reps - Seated Head Nods Vestibular Habituation  - 1 x daily - 5 x weekly - 2 sets - 30 sec hold - Feet Together Balance at [employer] Eyes Closed  - 7 x weekly - 3 sets - 30 sec hold - Corner Balance Feet Together: Eyes Open With Head Turns  - 1 x daily - 7 x weekly - 3 sets - 30 sec hold - Corner Balance Feet Together: Eyes Closed With Head Turns  - 1 x daily - 7 x weekly - 3 sets - 30 sec hold - Backward Walking with Counter Support  - 1 x daily - 5 x weekly - 1 sets - 3-5 reps - Staggered Stance Forward Backward Weight Shift with Counter Support  - 1 x daily - 7 x weekly - 2 sets - 10 reps   PATIENT EDUCATION: Education details: Continue current HEP, discussed upcoming MD visit and discussed pt/husband writing down questions for MD Person educated: Patient and Spouse Education method:  Explanation Education comprehension: verbalized understanding       Note: Objective measures were completed at Evaluation unless otherwise noted.  DIAGNOSTIC FINDINGS: 03/14/24 brain MRI: Acute 6 mm right splenium lacunar infarct. No acute hemorrhage or mass effect. 2. Underlying signal changes of advanced chronic small vessel disease in the bilateral white matter and deep gray nuclei.  COGNITION: Overall cognitive status: Within functional limits for tasks assessed;  husband assists with subjective report    SENSATION: Pt reports slight N/T in R foot d/t neuropathy   COORDINATION: Alternating pronation/supination: slight dysmetria on L Alternating toe tap: WNL Finger to nose: WNL  Vitals: 147/96 mmHg, 96% spO2, 80bpm     POSTURE: rounded shoulders and forward head  GAIT: Findings: Assistive device utilized:Single point cane, Level of assistance: Min A, and Comments: requires assistance for balance from husband   VESTIBULAR ASSESSMENT   GENERAL OBSERVATION: pt wears glasses but not sure what kind   OCULOMOTOR EXAM: Eye alignment: L eye elevated in orbit   Ocular ROM: limited R eye abduction   Spontaneous Nystagmus: absent   Gaze-Induced Nystagmus: absent   Smooth Pursuits: difficulty tracking R and c/o diplopia at 45 degrees gaze to each side   Saccades: disconjugate eye movement with R eye overshooting/missing target repeatedly and c/o diplopia with R gaze   Convergence/Divergence: 12 inches c/o diplopia cm    VESTIBULAR - OCULAR REFLEX:    Slow VOR: Normal   VOR Cancellation: Unable to Maintain Gaze; c/o diplopia   Head-Impulse Test: HIT Right: negative HIT Left: negative    POSITIONAL TESTING:   Right Sidelying: strong anxiety response and c/o dizziness with nystagmus but unable to identify d/t pt closing eyes Left Sidelying: negative                                                                                                                                TREATMENT DATE: 03/24/24    PATIENT EDUCATION: Education details: prognosis, POC, exam findings and need for MD f/u to address new oculomotor findings that were not documented in the hospital  Person educated: Patient and Spouse Education method: Explanation Education comprehension: verbalized understanding  HOME EXERCISE PROGRAM: Not yet initiated    GOALS: Goals reviewed with patient? Yes  SHORT TERM GOALS: Target date: 04/21/2024  Patient to be independent with initial HEP. Baseline: HEP initiated Goal status: IN PROGRESS    LONG TERM GOALS: Target date: 05/19/2024  Patient to be independent with advanced HEP. Baseline: Not yet initiated  Goal status: IN PROGRESS  Patient to report 0/10 dizziness with standing vertical and horizontal VOR for 30 seconds. Baseline: Unable Goal status: IN PROGRESS  Patient will report 0/10 dizziness with bed mobility.  Baseline: Symptomatic  Goal status: IN PROGRESS  Patient to demonstrate ambulation for 36ft with LRAD and supervision for safety.  Baseline: min A d/t imbalance  Goal status: IN PROGRESS  Patient to score at least 15/24 on DGI in order to decrease risk of falls. Baseline: deferred d/t safety 04/10/24; 10/24 04/29/24 Goal status: IN PROGRESS  Patient to score at least 40/56 on Berg in order to decrease risk of falls. Baseline: 34/56 04/10/24 Goal status: IN PROGRESS   ASSESSMENT:  CLINICAL IMPRESSION: Pt presents today with overall improvements, but still several episodes of near nausea with bending down, looking down to pick up shoes. Skilled PT session  focused on review of HEP and continued standing dynamic activities to work towards habituation of motion.  She does have more symptoms brought on with vertical head motions.  It briefly settles, then nausea comes on even after rest.  Encouraged pt to discuss these ongoing symptoms with neurologist at visit tomorrow.   Pt will continue to benefit from skilled PT  towards goals for improved functional mobility and decreased fall risk.  OBJECTIVE IMPAIRMENTS: Abnormal gait, decreased activity tolerance, decreased balance, decreased coordination, difficulty walking, and dizziness.   ACTIVITY LIMITATIONS: carrying, lifting, bending, sitting, standing, squatting, sleeping, stairs, transfers, bed mobility, bathing, toileting, dressing, reach over head, hygiene/grooming, locomotion level, and caring for others  PARTICIPATION LIMITATIONS: meal prep, cleaning, laundry, driving, shopping, community activity, yard work, and church  PERSONAL FACTORS: Age, Behavior pattern, Past/current experiences, Time since onset of injury/illness/exacerbation, and 3+ comorbidities: Anemia, anxiety, L breast CA s/p B mastectomy, depression, HLD, HTN, B TKA are also affecting patient's functional outcome.   REHAB POTENTIAL: Good  CLINICAL DECISION MAKING: Evolving/moderate complexity  EVALUATION COMPLEXITY: Moderate  PLAN:  PT FREQUENCY: 1-2x/week  PT DURATION: 8 weeks  PLANNED INTERVENTIONS: 97164- PT Re-evaluation, 97750- Physical Performance Testing, 97110-Therapeutic exercises, 97530- Therapeutic activity, [id number]- Neuromuscular re-education, 97535- Self Care, 02859- Manual therapy, 97116- Gait training, [phone]- Canalith repositioning, Patient/Family education, Balance training, Stair training, Taping, Vestibular training, Cryotherapy, and Moist heat  PLAN FOR NEXT SESSION:  Continue to work on balance, mobility exercises, habituation with head motions (vertical brings on most symptoms);   Recheck positional testing as needed(*R roll for canal/cupulo?) and treat.  Ask about neurologist visit.

## 2024-05-07 ENCOUNTER — Ambulatory Visit

## 2024-05-07 ENCOUNTER — Ambulatory Visit: Admitting: Physical Therapy

## 2024-05-07 ENCOUNTER — Encounter: Payer: Self-pay | Admitting: Physical Therapy

## 2024-05-07 DIAGNOSIS — R29818 Other symptoms and signs involving the nervous system: Secondary | ICD-10-CM

## 2024-05-07 DIAGNOSIS — R42 Dizziness and giddiness: Secondary | ICD-10-CM | POA: Diagnosis not present

## 2024-05-07 DIAGNOSIS — R2681 Unsteadiness on feet: Secondary | ICD-10-CM

## 2024-05-07 DIAGNOSIS — R278 Other lack of coordination: Secondary | ICD-10-CM

## 2024-05-07 DIAGNOSIS — R41842 Visuospatial deficit: Secondary | ICD-10-CM

## 2024-05-07 NOTE — Therapy (Signed)
 OUTPATIENT OCCUPATIONAL THERAPY NEURO  Treatment Note  Patient Name: Vanessa Christensen MRN: [id number] DOB:19-Mar-1951, 73 y.o., female Today's Date: 05/07/2024  PCP: Mahlon Comer BRAVO, MD REFERRING PROVIDER:   Douglass Kenney NOVAK, FNP    END OF SESSION:  OT End of Session - 05/07/24 1046     Visit Number 3    Number of Visits 9    Date for Recertification  06/06/24    Authorization Type Medicare Part A&B / BCBS Fed 2025    OT Start Time 1102    OT Stop Time 1147    OT Time Calculation (min) 45 min    Activity Tolerance Patient tolerated treatment well    Behavior During Therapy WFL for tasks assessed/performed           Past Medical History:  Diagnosis Date   Anemia    hx of  iron deficient   Anxiety    pt denies   Arthritis    hands and feet   Cancer (HCC) 10/04/2006   breast    left   Cataracts, both eyes    Depression    Depression    Diverticulosis of colon    GERD (gastroesophageal reflux disease)    Glaucoma    Hyperlipidemia    Hypertension    no meds   Joint pain    Knee pain    Obesity    OSA on CPAP    Osteoarthritis    Sleep apnea    no CPAP- no longer needed d/t weight loss   Thyroid  activity decreased    Past Surgical History:  Procedure Laterality Date   BREAST SURGERY Bilateral    CATARACT EXTRACTION     COLONOSCOPY     GLAUCOMA REPAIR     MASTECTOMY, RADICAL Bilateral    neulasta induced sterile abscesses     THYROIDECTOMY, PARTIAL     TONSILLECTOMY     TOTAL KNEE ARTHROPLASTY Right    TOTAL KNEE ARTHROPLASTY Left 10/27/2022   Procedure: LEFT TOTAL KNEE ARTHROPLASTY;  Surgeon: Vernetta Lonni GRADE, MD;  Location: [facility];  Service: Orthopedics;  Laterality: Left;   TOTAL KNEE REVISION Right 05/07/2020   Procedure: RIGHT TOTAL KNEE REVISION ARTHROPLASTY;  Surgeon: Vernetta Lonni GRADE, MD;  Location: [facility];  Service: Orthopedics;  Laterality: Right;   Patient Active Problem List   Diagnosis Date Noted   CVA (cerebral vascular  accident) (HCC) 03/14/2024   Cellulitis of right leg 12/21/2023   Onychomycosis 12/21/2023   Atherosclerosis of artery 03/12/2023   Adrenal nodule 11/16/2022   Thyroid  nodule 11/16/2022   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   Generalized obesity 10/05/2022   Memory loss 06/12/2022   Essential hypertension 04/13/2022   Obesity (BMI 30-39.9) 04/13/2022   Stress incontinence 11/08/2021   Status post revision of total replacement of right knee 05/07/2020   Failed total knee, right, 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/2020   History of total right knee replacement 01/28/2020   Vitamin D  deficiency 08/29/2018   Insulin  resistance 08/29/2018   Other specified glaucoma 08/14/2018   Incontinence of urine in female 11/07/2017   Vertigo 11/07/2017   Left knee pain 10/25/2016   Genetic testing 08/08/2016   Hypothyroidism 12/02/2015   Anxiety and depression 12/02/2015   OAB (overactive bladder) 12/02/2015   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/02/2015   BMI 37.0-37.9, adult 12/02/2015    ONSET DATE: referral 04/14/24  REFERRING DIAG: H51.9 (ICD-10-CM) - Abnormal eye movements   THERAPY  DIAG:  Visuospatial deficit  Other lack of coordination  Rationale for Evaluation and Treatment: Rehabilitation  SUBJECTIVE:   SUBJECTIVE STATEMENT: Pt reports that she had a neurologist appt yesterday, reports My right eye is lower than the left.   Pt accompanied by: self and significant other  PERTINENT HISTORY: 73 y.o. female with medical history significant of hypertension, hyperlipidemia who presented to the ED due to dizziness.  Patient had been dizzy for 24 hours which has been progressively worsening and with vomiting.  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  On PT eval pt with disconjugate gaze with R not tracking with L eye.  PMH/o Anemia, anxiety, L breast CA s/p B mastectomy, depression, HLD, HTN,  B TKA   PRECAUTIONS: Fall  WEIGHT BEARING RESTRICTIONS: No  PAIN:  Are you having pain? No  FALLS: Has patient fallen in last 6 months? Yes. Number of falls 2 - one in the backyard and one entering restaurant   LIVING ENVIRONMENT: Lives with: lives with their spouse Lives in: House/apartment Stairs: 2 step to enter with handrail; 2 story home with bedroom on 1st floor Has following equipment at home: Single point cane and Walker - 2 wheeled, built in seat in shower that she does not use, grab bar next to the toilet, in plans for remodel in bathroom to elevate toilet seat  PLOF: Independent and Independent with basic ADLs; would ask for assistance with balance when navigating curbs in the community and if dropping item  PATIENT GOALS: to walk without tripping  OBJECTIVE:  Note: Objective measures were completed at Evaluation unless otherwise noted.  HAND DOMINANCE: Right  ADLs: Transfers/ambulation related to ADLs: Mod I, will occasionally use walker when vertigo is really bad Grooming: Mod I UB Dressing: Mod I LB Dressing: husband assisting with donning socks s/p knee surgery Toileting: Mod I Bathing: Mod I Tub Shower transfers: Mod I   IADLs:  Pt is doing majority of the cooking and will do some household tasks but feels that she doesn't have the energy.  Pt reports that the lighting in stores will effect her.   MOBILITY STATUS: Hx of falls and occasional hand held assist especially when in the community, has used RW when experiencing more vertigo symptoms  POSTURE COMMENTS:  rounded shoulders and forward head   ACTIVITY TOLERANCE: Activity tolerance: decreased endurance s/p CVA  FUNCTIONAL OUTCOME MEASURES: PSFS   UPPER EXTREMITY ROM:  WFL bilaterally   UPPER EXTREMITY MMT:   Grossly 4+ to 5 overall   COORDINATION: 9 Hole Peg test: Right: 29.63 sec; Left: 33.28 sec  SENSATION: Pt reports slight N/T in R foot d/t neuropathy   COGNITION: Overall  cognitive status: Within functional limits for tasks assessed  VISION: Subjective report: wears glasses 80% of the time Baseline vision: Wears glasses all the time Visual history: cataracts removal  VISION ASSESSMENT: Tracking/Visual pursuits: Right eye does not track laterally and Decreased smoothness with horizontal tracking; Diplopia with vertical tracking upwards Convergence: Impaired: reporting diplopia 6-7 from nose Visual Fields: no apparent deficits  Completed Convergence Insufficiency Symptom Survey (CISS): 21/56 21 or higher is suggestive of convergence insufficiency  Patient has difficulty with following activities due to following visual impairments: reading, tying shoes  PERCEPTION: WFL  PRAXIS: WFL  TREATMENT DATE:  05/07/24 Educated pt in low vision general recommendations. See Pt instructions for handout provided. Reviewed previously issued HEP for linear pursuits and convergence. Min verbal cues required for proper form.  Pt instructed in therapeutic activity honing multitasking and linear scanning, recreating peg board pattern with large pegs and board. Pt made one error placing a peg on the row above where it should go, causing the rest of the pegs to shift up one row. Pt then completed recreating pattern with small pegs and peg board to promote linear scanning as well. Recreated this pattern with no errors. Educated pt in spot the difference games to carry over with linear pursuits and other visual concerns. Pt was able to identify 2 differences in first handout, OT found 3 additional ones. Second handout pt was able to find 8/10 differences. Pt given additional handout to attempt at home with 10 differences.   04/29/24 Bell cancellation: pt locating 32/35 bells in 5:18.  Pt initially scanning linearly but then transitioning to vertically.  Pt  omitting one at far left side of picture and other 2 in the middle of picture.   Trail Making: completed trail A in 42.69 sec picking up pen x1, completed trail B in 1:13 with no errors and picking up pen x2.   Vision HEP: OT providing pt with hidden picture, eye spy, and linear scanning handouts.  OT educating on Lumosity and/or other visual game apps to focus on visual scanning and/or attention. OT educating pt on carryover of each activity to functional tasks, particularly with reading.  OT educating on use of bookmark or card for line follow to decrease visual distraction and increase attention.  Discussed use of large print books and/or increasing font on computer. OT instructed pt in horizontal smooth pursuits and convergence HEP with use of note card with letter A on it for focus.  Provided with handouts.    04/24/24 Educated on potential benefit of exercises to address convergence insufficiency as well as disconjugate gaze with R eye.  Pt also reporting increased difficulty with reading even before CVA, therefore pt may benefit from adaptive strategies and/or equipment to aid in reading.       PATIENT EDUCATION: Education details: visual scanning activities, HEP, and educating on use of line follow, increased font, and  Person educated: Patient and Spouse Education method: Explanation Education comprehension: verbalized understanding and needs further education  HOME EXERCISE PROGRAM: Access Code: [email or web address] URL: [email or web address] Date: 04/29/2024 Prepared by: [name] - Outpatient  Rehab - [facility]  Exercises - Seated Horizontal Smooth Pursuit  - 2 x daily - 10 reps - Seated Proximal-Distal Smooth Pursuit  - 2 x daily - 10 reps   GOALS: Goals reviewed with patient? Yes  SHORT TERM GOALS: Target date: 05/16/24  Pt will independently return demo of vision HEP. Baseline: new to OPOT Goal status: INITIAL  2.  Pt will verbalize understanding of  energy conservation strategies and recall 3 in use. Baseline: easily fatigued and decreased activity tolerance Goal status: INITIAL  3.  Pt will independently recall or demonstrate at least 3 vision compensation strategies. Baseline: disconjugate gaze/convergence insufficiency, and low vision Goal status: INITIAL   LONG TERM GOALS: Target date: 06/06/24  Patient will report at least two-point increase in average PSFS score or at least three-point increase in a single activity score indicating functionally significant improvement given minimum detectable change. Baseline: 4.3 Goal status: INITIAL  2.  Pt will report ability to read 15 mins with  improved ease with use of AE/strategies PRN. Baseline: reports losing place when reading, diplopia during reaching Goal status: INITIAL  3.  Pt will report improved score on CISS to <21 for improved convergence. Baseline: 21/56 Goal status: INITIAL  4.  Pt will complete table top visual scanning activity at Mod I level with use of adaptive strategies PRN. Baseline: TBD Goal status: INITIAL   ASSESSMENT:  CLINICAL IMPRESSION: Patient is a 73 y.o. female who was seen today for occupational therapy treatment for disconjugate gaze s/p CVA. Pt reporting diplopia with vertical gaze, especially upwards, and difficulty with focus, recall, and speed with reading. Pt noted to have some difficulty with linear scanning, some small errors noted. Pt receptive to recommendation of use of large print books, from [employer], and may benefit from further discussion and education on AE to increase font when reading.    PERFORMANCE DEFICITS: in functional skills including ADLs, IADLs, ROM, balance, endurance, decreased knowledge of precautions, decreased knowledge of use of DME, and vision and psychosocial skills including coping strategies and environmental adaptation.     PLAN:  OT FREQUENCY: 1-2x/week  OT DURATION: 6 weeks  PLANNED INTERVENTIONS: 97168  OT Re-evaluation, 97535 self care/ADL training, 02889 therapeutic exercise, 97530 therapeutic activity, 97112 neuromuscular re-education, functional mobility training, visual/perceptual remediation/compensation, psychosocial skills training, energy conservation, coping strategies training, patient/family education, and DME and/or AE instructions  RECOMMENDED OTHER SERVICES: NA  CONSULTED AND AGREED WITH PLAN OF CARE: Patient and family member/caregiver  PLAN FOR NEXT SESSION: Add onto vision HEP prn, progress table top visual scanning with peg board task, incorporate reading, continue educate on adaptive strategies and/or equipment to aid in low vision and diplopia   Rocky Dutch, OTR/L 05/07/2024, 12:11 PM  [facility] at [facility] [address] [place], [place] [address] Phone # [phone] Fax # [phone]

## 2024-05-07 NOTE — Patient Instructions (Signed)
 SABRA

## 2024-05-08 ENCOUNTER — Telehealth: Payer: Self-pay

## 2024-05-08 NOTE — Telephone Encounter (Signed)
 Started PA for [facility] 1 mg via covermymeds

## 2024-05-08 NOTE — Telephone Encounter (Signed)
 [employer]  denied, message from pharmacy:  We denied coverage for this drug because: Your plan's Medicare Part D drug plan cannot cover drugs used for loss of appetite (anorexia), weight loss, or weight gain.

## 2024-05-09 NOTE — Therapy (Signed)
 OUTPATIENT PHYSICAL THERAPY NEURO NOTE   Patient Name: Vanessa Christensen MRN: [id number] DOB:1951/05/17, 73 y.o., female Today's Date: 05/12/2024   PCP:    Mahlon Comer BRAVO, MD   REFERRING PROVIDER: Raenelle Donalda HERO, MD     END OF SESSION:  PT End of Session - 05/12/24 1057     Visit Number 11    Number of Visits 20    Date for Recertification  06/11/24    Authorization Type Medicare/BCBS Federal    PT Start Time 1017    PT Stop Time 1058    PT Time Calculation (min) 41 min    Equipment Utilized During Treatment Gait belt    Activity Tolerance Patient tolerated treatment well    Behavior During Therapy WFL for tasks assessed/performed                   Past Medical History:  Diagnosis Date   Anemia    hx of  iron deficient   Anxiety    pt denies   Arthritis    hands and feet   Cancer (HCC) 10/04/2006   breast    left   Cataracts, both eyes    Depression    Depression    Diverticulosis of colon    GERD (gastroesophageal reflux disease)    Glaucoma    Hyperlipidemia    Hypertension    no meds   Joint pain    Knee pain    Obesity    OSA on CPAP    Osteoarthritis    Sleep apnea    no CPAP- no longer needed d/t weight loss   Thyroid  activity decreased    Past Surgical History:  Procedure Laterality Date   BREAST SURGERY Bilateral    CATARACT EXTRACTION     COLONOSCOPY     GLAUCOMA REPAIR     MASTECTOMY, RADICAL Bilateral    neulasta induced sterile abscesses     THYROIDECTOMY, PARTIAL     TONSILLECTOMY     TOTAL KNEE ARTHROPLASTY Right    TOTAL KNEE ARTHROPLASTY Left 10/27/2022   Procedure: LEFT TOTAL KNEE ARTHROPLASTY;  Surgeon: Vernetta Lonni GRADE, MD;  Location: [facility];  Service: Orthopedics;  Laterality: Left;   TOTAL KNEE REVISION Right 05/07/2020   Procedure: RIGHT TOTAL KNEE REVISION ARTHROPLASTY;  Surgeon: Vernetta Lonni GRADE, MD;  Location: [facility];  Service: Orthopedics;  Laterality: Right;   Patient Active Problem  List   Diagnosis Date Noted   CVA (cerebral vascular accident) (HCC) 03/14/2024   Cellulitis of right leg 12/21/2023   Onychomycosis 12/21/2023   Atherosclerosis of artery 03/12/2023   Adrenal nodule 11/16/2022   Thyroid  nodule 11/16/2022   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   Generalized obesity 10/05/2022   Memory loss 06/12/2022   Essential hypertension 04/13/2022   Obesity (BMI 30-39.9) 04/13/2022   Stress incontinence 11/08/2021   Status post revision of total replacement of right knee 05/07/2020   Failed total knee, right, 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/2020   History of total right knee replacement 01/28/2020   Vitamin D  deficiency 08/29/2018   Insulin  resistance 08/29/2018   Other specified glaucoma 08/14/2018   Incontinence of urine in female 11/07/2017   Vertigo 11/07/2017   Left knee pain 10/25/2016   Genetic testing 08/08/2016   Hypothyroidism 12/02/2015   Anxiety and depression 12/02/2015   OAB (overactive bladder) 12/02/2015   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/02/2015   BMI 37.0-37.9, adult 12/02/2015  ONSET DATE: 10/10/125  REFERRING DIAG: I63.9 (ICD-10-CM) - CVA (cerebral vascular accident) (HCC)  THERAPY DIAG:  Dizziness and giddiness  Unsteadiness on feet  Other symptoms and signs involving the nervous system  Rationale for Evaluation and Treatment: Rehabilitation  SUBJECTIVE:                                                                                                                                                                                             SUBJECTIVE STATEMENT: Husband reports some instability when pt walked down an incline. Pt reports remaining difficulty sleeping but no dizziness or nausea.      Pt accompanied by: significant other  PERTINENT HISTORY: Anemia, anxiety, L breast CA s/p B mastectomy, depression, HLD, HTN, B TKA  PAIN:  Are you having pain? No and just  stiffness in B knees   PRECAUTIONS: Fall  RED FLAGS: None   WEIGHT BEARING RESTRICTIONS: No  FALLS: Has patient fallen in last 6 months? Yes. Number of falls 1  LIVING ENVIRONMENT: Lives with: lives with their spouse Lives in: House/apartment Stairs: 2 step to enter with handrail; 2 story home with bedroom on 1st floor Has following equipment at home: Single point cane and Walker - 2 wheeled  PLOF: Independent  PATIENT GOALS: improve dizziness  OBJECTIVE:      TODAY'S TREATMENT: 05/12/24 Activity Comments  standing on foam wt shifting Focus on controlling posterior wt shift   alt toe tap on step 6 Focus on adequate wt shifting; pt somewhat hesitant on R LE but form improved after practice. Able to wean UE support with CGA  fwd/back stepping Cueing for longer backwards steps  Gait training 228ft Cueing for wt shift, longer steps. Much improved shuffling and arm swing  bending down to place rings on cones Required scanning, bending, turning. CGA-min A for safety with bending. No dizziness  Backwards walking  EC for walking  Tendency to veer L with EC; good backwards        HOME EXERCISE PROGRAM Last updated: 05/12/24 Access Code: [email or web address] URL: [email or web address] Date: 05/12/2024 Prepared by: [facility] - Outpatient  Rehab - [facility]  Program Notes perform with husband's supervision for safety  Exercises - Supine to Left Sidelying Vestibular Habituation  - 1 x daily - 5 x weekly - 2 sets - 3-5 reps - Supine to Right Sidelying Vestibular Habituation  - 1 x daily - 5 x weekly - 2 sets - 3-5 reps - Seated Head Nods Vestibular Habituation  - 1 x daily - 5 x weekly - 2 sets - 30 sec hold - Feet Together Balance at [employer]  Eyes Closed  - 7 x weekly - 3 sets - 30 sec hold - Corner Balance Feet Together: Eyes Open With Head Turns  - 1 x daily - 7 x weekly - 3 sets - 30 sec hold - Corner Balance Feet Together: Eyes Closed With Head Turns  - 1 x  daily - 7 x weekly - 3 sets - 30 sec hold - Backward Walking with Counter Support  - 1 x daily - 5 x weekly - 1 sets - 3-5 reps - Staggered Stance Forward Backward Weight Shift with Counter Support  - 1 x daily - 7 x weekly - 2 sets - 10 reps - Standing Toe Taps  - 1 x daily - 5 x weekly - 2 sets - 10 reps - Alternating Step Forward with Support  - 1 x daily - 5 x weekly - 2 sets - 10 reps     PATIENT EDUCATION: Education details: HEP with edu for safety Person educated: Patient and Spouse Education method: Explanation, Demonstration, Tactile cues, Verbal cues, and Handouts Education comprehension: verbalized understanding and returned demonstration    Note: Objective measures were completed at Evaluation unless otherwise noted.  DIAGNOSTIC FINDINGS: 03/14/24 brain MRI: Acute 6 mm right splenium lacunar infarct. No acute hemorrhage or mass effect. 2. Underlying signal changes of advanced chronic small vessel disease in the bilateral white matter and deep gray nuclei.  COGNITION: Overall cognitive status: Within functional limits for tasks assessed; husband assists with subjective report    SENSATION: Pt reports slight N/T in R foot d/t neuropathy   COORDINATION: Alternating pronation/supination: slight dysmetria on L Alternating toe tap: WNL Finger to nose: WNL  Vitals: 147/96 mmHg, 96% spO2, 80bpm     POSTURE: rounded shoulders and forward head  GAIT: Findings: Assistive device utilized:Single point cane, Level of assistance: Min A, and Comments: requires assistance for balance from husband   VESTIBULAR ASSESSMENT   GENERAL OBSERVATION: pt wears glasses but not sure what kind   OCULOMOTOR EXAM: Eye alignment: L eye elevated in orbit   Ocular ROM: limited R eye abduction   Spontaneous Nystagmus: absent   Gaze-Induced Nystagmus: absent   Smooth Pursuits: difficulty tracking R and c/o diplopia at 45 degrees gaze to each side   Saccades: disconjugate eye movement  with R eye overshooting/missing target repeatedly and c/o diplopia with R gaze   Convergence/Divergence: 12 inches c/o diplopia cm    VESTIBULAR - OCULAR REFLEX:    Slow VOR: Normal   VOR Cancellation: Unable to Maintain Gaze; c/o diplopia   Head-Impulse Test: HIT Right: negative HIT Left: negative    POSITIONAL TESTING:   Right Sidelying: strong anxiety response and c/o dizziness with nystagmus but unable to identify d/t pt closing eyes Left Sidelying: negative                                                                                                                               TREATMENT  DATE: 03/24/24    PATIENT EDUCATION: Education details: prognosis, POC, exam findings and need for MD f/u to address new oculomotor findings that were not documented in the hospital  Person educated: Patient and Spouse Education method: Explanation Education comprehension: verbalized understanding  HOME EXERCISE PROGRAM: Not yet initiated    GOALS: Goals reviewed with patient? Yes  SHORT TERM GOALS: Target date: 04/21/2024  Patient to be independent with initial HEP. Baseline: HEP initiated Goal status: MET    LONG TERM GOALS: Target date: 06/11/2024  Patient to be independent with advanced HEP. Baseline: Not yet initiated  Goal status: IN PROGRESS 05/07/24  Patient to report 0/10 dizziness with standing vertical and horizontal VOR for 30 seconds. Baseline: Unable; 1/10 dizziness horizontal 05/07/24 Goal status: IN PROGRESS 05/07/24  Patient will report 0/10 dizziness with bed mobility.  Baseline: Symptomatic; R roll: 2-3/10 dizziness Rolling supine 1-2/10 dizziness  R side > sit 3-4/10 dizziness 05/07/24  Goal status: IN PROGRESS 05/07/24  Patient to demonstrate ambulation for 361ft with LRAD and supervision for safety.  Baseline: min A d/t imbalance; Completed 388ft in 3 minutes with supervision 05/07/24 Goal status: MET 05/07/24  Patient to score at least 15/24 on DGI in  order to decrease risk of falls. Baseline: deferred d/t safety 04/10/24; 10/24 04/29/24 Goal status: IN PROGRESS 04/29/24  Patient to score at least 40/56 on [place] in order to decrease risk of falls. Baseline: 34/56 04/10/24; 45/56 05/07/24  Goal status: MET 05/07/24    ASSESSMENT:  CLINICAL IMPRESSION: Patient arrived to session without new complaints. Session focused on dynamic balance training, focusing on wt shifting and SLS. Also provided cueing for gait training which resulted in longer strides, more continuous and efficient stepping. HEP was updated with exercises that were safely performed today. Patient tolerated session well and without complaints at end of appointment. OBJECTIVE IMPAIRMENTS: Abnormal gait, decreased activity tolerance, decreased balance, decreased coordination, difficulty walking, and dizziness.   ACTIVITY LIMITATIONS: carrying, lifting, bending, sitting, standing, squatting, sleeping, stairs, transfers, bed mobility, bathing, toileting, dressing, reach over head, hygiene/grooming, locomotion level, and caring for others  PARTICIPATION LIMITATIONS: meal prep, cleaning, laundry, driving, shopping, community activity, yard work, and church  PERSONAL FACTORS: Age, Behavior pattern, Past/current experiences, Time since onset of injury/illness/exacerbation, and 3+ comorbidities: Anemia, anxiety, L breast CA s/p B mastectomy, depression, HLD, HTN, B TKA are also affecting patient's functional outcome.   REHAB POTENTIAL: Good  CLINICAL DECISION MAKING: Evolving/moderate complexity  EVALUATION COMPLEXITY: Moderate  PLAN:  PT FREQUENCY: 1-2x/week  PT DURATION: other: 5 weeks   PLANNED INTERVENTIONS: [id number]- PT Re-evaluation, 97750- Physical Performance Testing, 97110-Therapeutic exercises, 97530- Therapeutic activity, [id number]- Neuromuscular re-education, 97535- Self Care, [id number]- Manual therapy, [id number]- Gait training, [phone]- Canalith repositioning, Patient/Family education,  Balance training, Stair training, Taping, Vestibular training, Cryotherapy, and Moist heat  PLAN FOR NEXT SESSION:  Continue to work on habituating bed mobility, head nods. Balance with narrow BOS, gait with increased foot clearance and weight shift   Louana Terrilyn Christians, [place], DPT 05/12/24 11:03 AM  [facility] at [facility] [address] [place], [address] [place], [place] [address] Phone # [phone] Fax # [phone]

## 2024-05-12 ENCOUNTER — Encounter: Payer: Self-pay | Admitting: Physical Therapy

## 2024-05-12 ENCOUNTER — Ambulatory Visit: Admitting: Physical Therapy

## 2024-05-12 ENCOUNTER — Ambulatory Visit: Admitting: Occupational Therapy

## 2024-05-12 DIAGNOSIS — R2681 Unsteadiness on feet: Secondary | ICD-10-CM

## 2024-05-12 DIAGNOSIS — R42 Dizziness and giddiness: Secondary | ICD-10-CM

## 2024-05-12 DIAGNOSIS — R29818 Other symptoms and signs involving the nervous system: Secondary | ICD-10-CM

## 2024-05-12 DIAGNOSIS — R41842 Visuospatial deficit: Secondary | ICD-10-CM

## 2024-05-12 NOTE — Therapy (Signed)
 OUTPATIENT OCCUPATIONAL THERAPY NEURO  Treatment Note  Patient Name: Vanessa Christensen MRN: [id number] DOB:[birth year], 73 y.o., female Today's Date: 05/12/2024  PCP: Mahlon Comer BRAVO, MD REFERRING PROVIDER:   Douglass Kenney NOVAK, FNP    END OF SESSION:  OT End of Session - 05/12/24 1215     Visit Number 4    Number of Visits 9    Date for Recertification  06/06/24    Authorization Type Medicare Part A&B / BCBS Fed 2025    OT Start Time 1102    OT Stop Time 1147    OT Time Calculation (min) 45 min    Activity Tolerance Patient tolerated treatment well    Behavior During Therapy WFL for tasks assessed/performed            Past Medical History:  Diagnosis Date   Anemia    hx of  iron deficient   Anxiety    pt denies   Arthritis    hands and feet   Cancer (HCC) 10/04/2006   breast    left   Cataracts, both eyes    Depression    Depression    Diverticulosis of colon    GERD (gastroesophageal reflux disease)    Glaucoma    Hyperlipidemia    Hypertension    no meds   Joint pain    Knee pain    Obesity    OSA on CPAP    Osteoarthritis    Sleep apnea    no CPAP- no longer needed d/t weight loss   Thyroid  activity decreased    Past Surgical History:  Procedure Laterality Date   BREAST SURGERY Bilateral    CATARACT EXTRACTION     COLONOSCOPY     GLAUCOMA REPAIR     MASTECTOMY, RADICAL Bilateral    neulasta induced sterile abscesses     THYROIDECTOMY, PARTIAL     TONSILLECTOMY     TOTAL KNEE ARTHROPLASTY Right    TOTAL KNEE ARTHROPLASTY Left 10/27/2022   Procedure: LEFT TOTAL KNEE ARTHROPLASTY;  Surgeon: Vernetta Lonni GRADE, MD;  Location: [facility];  Service: Orthopedics;  Laterality: Left;   TOTAL KNEE REVISION Right 05/07/2020   Procedure: RIGHT TOTAL KNEE REVISION ARTHROPLASTY;  Surgeon: Vernetta Lonni GRADE, MD;  Location: [facility];  Service: Orthopedics;  Laterality: Right;   Patient Active Problem List   Diagnosis Date Noted   CVA (cerebral vascular  accident) (HCC) 03/14/2024   Cellulitis of right leg 12/21/2023   Onychomycosis 12/21/2023   Atherosclerosis of artery 03/12/2023   Adrenal nodule 11/16/2022   Thyroid  nodule 11/16/2022   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   Generalized obesity 10/05/2022   Memory loss 06/12/2022   Essential hypertension 04/13/2022   Obesity (BMI 30-39.9) 04/13/2022   Stress incontinence 11/08/2021   Status post revision of total replacement of right knee 05/07/2020   Failed total knee, right, 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/2020   History of total right knee replacement 01/28/2020   Vitamin D  deficiency 08/29/2018   Insulin  resistance 08/29/2018   Other specified glaucoma 08/14/2018   Incontinence of urine in female 11/07/2017   Vertigo 11/07/2017   Left knee pain 10/25/2016   Genetic testing 08/08/2016   Hypothyroidism 12/02/2015   Anxiety and depression 12/02/2015   OAB (overactive bladder) 12/02/2015   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/02/2015   BMI 37.0-37.9, adult 12/02/2015    ONSET DATE: referral 04/14/24  REFERRING DIAG: H51.9 (ICD-10-CM) - Abnormal eye movements  THERAPY DIAG:  Visuospatial deficit  Other symptoms and signs involving the nervous system  Rationale for Evaluation and Treatment: Rehabilitation  SUBJECTIVE:   SUBJECTIVE STATEMENT: Pt reports that she has an appointment with her ophthalmologist in January but is hoping to see if she can get in sooner.  Pt accompanied by: self and significant other  PERTINENT HISTORY: 73 y.o. female with medical history significant of hypertension, hyperlipidemia who presented to the ED due to dizziness.  Patient had been dizzy for 24 hours which has been progressively worsening and with vomiting.  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  On PT eval pt with disconjugate gaze with R not tracking with L eye.  PMH/o Anemia, anxiety, L  breast CA s/p B mastectomy, depression, HLD, HTN, B TKA   PRECAUTIONS: Fall  WEIGHT BEARING RESTRICTIONS: No  PAIN:  Are you having pain? No  FALLS: Has patient fallen in last 6 months? Yes. Number of falls 2 - one in the backyard and one entering restaurant   LIVING ENVIRONMENT: Lives with: lives with their spouse Lives in: House/apartment Stairs: 2 step to enter with handrail; 2 story home with bedroom on 1st floor Has following equipment at home: Single point cane and Walker - 2 wheeled, built in seat in shower that she does not use, grab bar next to the toilet, in plans for remodel in bathroom to elevate toilet seat  PLOF: Independent and Independent with basic ADLs; would ask for assistance with balance when navigating curbs in the community and if dropping item  PATIENT GOALS: to walk without tripping  OBJECTIVE:  Note: Objective measures were completed at Evaluation unless otherwise noted.  HAND DOMINANCE: Right  ADLs: Transfers/ambulation related to ADLs: Mod I, will occasionally use walker when vertigo is really bad Grooming: Mod I UB Dressing: Mod I LB Dressing: husband assisting with donning socks s/p knee surgery Toileting: Mod I Bathing: Mod I Tub Shower transfers: Mod I   IADLs:  Pt is doing majority of the cooking and will do some household tasks but feels that she doesn't have the energy.  Pt reports that the lighting in stores will effect her.   MOBILITY STATUS: Hx of falls and occasional hand held assist especially when in the community, has used RW when experiencing more vertigo symptoms  POSTURE COMMENTS:  rounded shoulders and forward head   ACTIVITY TOLERANCE: Activity tolerance: decreased endurance s/p CVA  FUNCTIONAL OUTCOME MEASURES: PSFS   UPPER EXTREMITY ROM:  WFL bilaterally   UPPER EXTREMITY MMT:   Grossly 4+ to 5 overall   COORDINATION: 9 Hole Peg test: Right: 29.63 sec; Left: 33.28 sec  SENSATION: Pt reports slight N/T in R  foot d/t neuropathy   COGNITION: Overall cognitive status: Within functional limits for tasks assessed  VISION: Subjective report: wears glasses 80% of the time Baseline vision: Wears glasses all the time Visual history: cataracts removal  VISION ASSESSMENT: Tracking/Visual pursuits: Right eye does not track laterally and Decreased smoothness with horizontal tracking; Diplopia with vertical tracking upwards Convergence: Impaired: reporting diplopia 6-7 from nose Visual Fields: no apparent deficits  Completed Convergence Insufficiency Symptom Survey (CISS): 21/56 21 or higher is suggestive of convergence insufficiency  Patient has difficulty with following activities due to following visual impairments: reading, tying shoes  PERCEPTION: WFL  PRAXIS: WFL  TREATMENT DATE:  05/12/24 Visual scanning/attention: pt demonstrating good attention and scanning with locating 88 with vertical scanning with use of line follow to cover up below lines.  Pt with increased difficulty with horizontal scanning R and L to locate number that is repeated only 4 times.  Pt benefiting from line follow and cues to complete in systematic order to increase attention and carryover.  OT educating on functional implications with reading. Reading: engaged in reading 2 paragraphs in magazine with one instance of repeating a particular line but not skipping any lines when utilizing line follow to cover up below lines.   Adaptive equipment: engaged in discussion about use of page magnifier to aid in reading as pt with most difficulty with smaller font.  OT recommending use of page size or wearable option to allow use of other hand to continue with line follow.    05/07/24 Educated pt in low vision general recommendations. See Pt instructions for handout provided. Reviewed previously issued HEP  for linear pursuits and convergence. Min verbal cues required for proper form.  Pt instructed in therapeutic activity honing multitasking and linear scanning, recreating peg board pattern with large pegs and board. Pt made one error placing a peg on the row above where it should go, causing the rest of the pegs to shift up one row. Pt then completed recreating pattern with small pegs and peg board to promote linear scanning as well. Recreated this pattern with no errors. Educated pt in spot the difference games to carry over with linear pursuits and other visual concerns. Pt was able to identify 2 differences in first handout, OT found 3 additional ones. Second handout pt was able to find 8/10 differences. Pt given additional handout to attempt at home with 10 differences.   04/29/24 Bell cancellation: pt locating 32/35 bells in 5:18.  Pt initially scanning linearly but then transitioning to vertically.  Pt omitting one at far left side of picture and other 2 in the middle of picture.   Trail Making: completed trail A in 42.69 sec picking up pen x1, completed trail B in 1:13 with no errors and picking up pen x2.   Vision HEP: OT providing pt with hidden picture, eye spy, and linear scanning handouts.  OT educating on Lum[employer] and/or other visual game apps to focus on visual scanning and/or attention. OT educating pt on carryover of each activity to functional tasks, particularly with reading.  OT educating on use of bookmark or card for line follow to decrease visual distraction and increase attention.  Discussed use of large print books and/or increasing font on computer. OT instructed pt in horizontal smooth pursuits and convergence HEP with use of note card with letter A on it for focus.  Provided with handouts.  PATIENT EDUCATION: Education details: visual scanning activities, HEP, and educating on use of line follow, increased font, and use of magnifiers Person educated: Patient and  Spouse Education method: Explanation Education comprehension: verbalized understanding and needs further education  HOME EXERCISE PROGRAM: Access Code: [email or web address] URL: [email or web address] Date: 04/29/2024 Prepared by: [facility] - Outpatient  Rehab - [facility]  Exercises - Seated Horizontal Smooth Pursuit  - 2 x daily - 10 reps - Seated Proximal-Distal Smooth Pursuit  - 2 x daily - 10 reps   GOALS: Goals reviewed with patient? Yes  SHORT TERM GOALS: Target date: 05/16/24  Pt will independently return demo of vision HEP. Baseline: new to OPOT Goal status: in progress  2.  Pt will  verbalize understanding of energy conservation strategies and recall 3 in use. Baseline: easily fatigued and decreased activity tolerance Goal status: in progress  3.  Pt will independently recall or demonstrate at least 3 vision compensation strategies. Baseline: disconjugate gaze/convergence insufficiency, and low vision Goal status: in progress   LONG TERM GOALS: Target date: 06/06/24  Patient will report at least two-point increase in average PSFS score or at least three-point increase in a single activity score indicating functionally significant improvement given minimum detectable change. Baseline: 4.3 Goal status: in progress  2.  Pt will report ability to read 15 mins with improved ease with use of AE/strategies PRN. Baseline: reports losing place when reading, diplopia during reaching Goal status: in progress  3.  Pt will report improved score on CISS to <21 for improved convergence. Baseline: 21/56 Goal status: in progress  4.  Pt will complete table top visual scanning activity at Mod I level with use of adaptive strategies PRN. Baseline: TBD Goal status: in progress   ASSESSMENT:  CLINICAL IMPRESSION: Patient is a 73 y.o. female who was seen today for occupational therapy treatment for disconjugate gaze s/p CVA. Pt reporting diplopia with vertical gaze,  especially upwards, and difficulty with focus, recall, and speed with reading. Pt with most difficulty with linear scanning, especially during R and L scanning and reading.  Pt receptive to recommendation of use of large print books, from [employer], and pt and spouse to look into magnifiers to aid in reading and may benefit from further discussion and education on AE to increase font when reading.    PERFORMANCE DEFICITS: in functional skills including ADLs, IADLs, ROM, balance, endurance, decreased knowledge of precautions, decreased knowledge of use of DME, and vision and psychosocial skills including coping strategies and environmental adaptation.     PLAN:  OT FREQUENCY: 1-2x/week  OT DURATION: 6 weeks  PLANNED INTERVENTIONS: 97168 OT Re-evaluation, 97535 self care/ADL training, 02889 therapeutic exercise, 97530 therapeutic activity, 97112 neuromuscular re-education, functional mobility training, visual/perceptual remediation/compensation, psychosocial skills training, energy conservation, coping strategies training, patient/family education, and DME and/or AE instructions  RECOMMENDED OTHER SERVICES: NA  CONSULTED AND AGREED WITH PLAN OF CARE: Patient and family member/caregiver  PLAN FOR NEXT SESSION: Add onto vision HEP prn, progress table top visual scanning with peg board task, incorporate reading, continue educate on adaptive strategies and/or equipment to aid in low vision and diplopia   KAYLENE DOMINO, OTR/L 05/12/2024, 12:15 PM  [facility] at [facility] [address] [place], [place] [address] Phone # [phone] Fax # [phone]

## 2024-05-13 NOTE — Therapy (Signed)
 OUTPATIENT PHYSICAL THERAPY NEURO NOTE   Patient Name: Vanessa Christensen MRN: [id number] DOB:[birth year], 73 y.o., female Today's Date: 05/13/2024   PCP:    Mahlon Comer BRAVO, MD   REFERRING PROVIDER: Raenelle Donalda HERO, MD     END OF SESSION:             Past Medical History:  Diagnosis Date   Anemia    hx of  iron deficient   Anxiety    pt denies   Arthritis    hands and feet   Cancer (HCC) 10/04/2006   breast    left   Cataracts, both eyes    Depression    Depression    Diverticulosis of colon    GERD (gastroesophageal reflux disease)    Glaucoma    Hyperlipidemia    Hypertension    no meds   Joint pain    Knee pain    Obesity    OSA on CPAP    Osteoarthritis    Sleep apnea    no CPAP- no longer needed d/t weight loss   Thyroid  activity decreased    Past Surgical History:  Procedure Laterality Date   BREAST SURGERY Bilateral    CATARACT EXTRACTION     COLONOSCOPY     GLAUCOMA REPAIR     MASTECTOMY, RADICAL Bilateral    neulasta induced sterile abscesses     THYROIDECTOMY, PARTIAL     TONSILLECTOMY     TOTAL KNEE ARTHROPLASTY Right    TOTAL KNEE ARTHROPLASTY Left 10/27/2022   Procedure: LEFT TOTAL KNEE ARTHROPLASTY;  Surgeon: Vernetta Lonni GRADE, MD;  Location: [facility];  Service: Orthopedics;  Laterality: Left;   TOTAL KNEE REVISION Right 05/07/2020   Procedure: RIGHT TOTAL KNEE REVISION ARTHROPLASTY;  Surgeon: Vernetta Lonni GRADE, MD;  Location: [facility];  Service: Orthopedics;  Laterality: Right;   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   Generalized obesity 10/05/2022   Memory loss 06/12/2022   Essential hypertension 04/13/2022   Obesity (BMI 30-39.9)  04/13/2022   Stress incontinence 11/08/2021   Status post revision of total replacement of right knee 05/07/2020   Failed total knee, right, 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/2020   History of total right knee replacement 01/28/2020   Vitamin D  deficiency 08/29/2018   Insulin  resistance 08/29/2018   Other specified glaucoma 08/14/2018   Incontinence of urine in female 11/07/2017   Vertigo 11/07/2017   Left knee pain 10/25/2016   Genetic testing 08/08/2016   Hypothyroidism 12/02/2015   Anxiety and depression 12/02/2015   OAB (overactive bladder) 12/02/2015   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/02/2015   BMI 37.0-37.9, adult 12/02/2015    ONSET DATE: 10/10/125  REFERRING DIAG: I63.9 (ICD-10-CM) - CVA (cerebral vascular accident) (HCC)  THERAPY DIAG:  No diagnosis found.  Rationale for Evaluation and Treatment: Rehabilitation  SUBJECTIVE:  SUBJECTIVE STATEMENT: Husband reports some instability when pt walked down an incline. Pt reports remaining difficulty sleeping but no dizziness or nausea.      Pt accompanied by: significant other  PERTINENT HISTORY: Anemia, anxiety, L breast CA s/p B mastectomy, depression, HLD, HTN, B TKA  PAIN:  Are you having pain? No and just stiffness in B knees   PRECAUTIONS: Fall  RED FLAGS: None   WEIGHT BEARING RESTRICTIONS: No  FALLS: Has patient fallen in last 6 months? Yes. Number of falls 1  LIVING ENVIRONMENT: Lives with: lives with their spouse Lives in: House/apartment Stairs: 2 step to enter with handrail; 2 story home with bedroom on 1st floor Has following equipment at home: Single point cane and Walker - 2 wheeled  PLOF: Independent  PATIENT GOALS: improve dizziness  OBJECTIVE:    TODAY'S TREATMENT:  05/14/24 Activity Comments                        TODAY'S TREATMENT: 05/12/24 Activity Comments  standing on foam wt shifting Focus on controlling posterior wt shift   alt toe tap on step 6 Focus on adequate wt shifting; pt somewhat hesitant on R LE but form improved after practice. Able to wean UE support with CGA  fwd/back stepping Cueing for longer backwards steps  Gait training 295ft Cueing for wt shift, longer steps. Much improved shuffling and arm swing  bending down to place rings on cones Required scanning, bending, turning. CGA-min A for safety with bending. No dizziness  Backwards walking  EC for walking  Tendency to veer L with EC; good backwards        HOME EXERCISE PROGRAM Last updated: 05/12/24 Access Code: [email or web address] URL: [email or web address] Date: 05/12/2024 Prepared by: [facility] - Outpatient  Rehab - [facility]  Program Notes perform with husband's supervision for safety  Exercises - Supine to Left Sidelying Vestibular Habituation  - 1 x daily - 5 x weekly - 2 sets - 3-5 reps - Supine to Right Sidelying Vestibular Habituation  - 1 x daily - 5 x weekly - 2 sets - 3-5 reps - Seated Head Nods Vestibular Habituation  - 1 x daily - 5 x weekly - 2 sets - 30 sec hold - Feet Together Balance at [employer] Eyes Closed  - 7 x weekly - 3 sets - 30 sec hold - Corner Balance Feet Together: Eyes Open With Head Turns  - 1 x daily - 7 x weekly - 3 sets - 30 sec hold - Corner Balance Feet Together: Eyes Closed With Head Turns  - 1 x daily - 7 x weekly - 3 sets - 30 sec hold - Backward Walking with Counter Support  - 1 x daily - 5 x weekly - 1 sets - 3-5 reps - Staggered Stance Forward Backward Weight Shift with Counter Support  - 1 x daily - 7 x weekly - 2 sets - 10 reps - Standing Toe Taps  - 1 x daily - 5 x weekly - 2 sets - 10 reps - Alternating Step Forward with Support  - 1 x daily - 5 x weekly - 2 sets - 10 reps     PATIENT  EDUCATION: Education details: HEP with edu for safety Person educated: Patient and Spouse Education method: Explanation, Demonstration, Tactile cues, Verbal cues, and Handouts Education comprehension: verbalized understanding and returned demonstration    Note: Objective measures were completed at Evaluation unless otherwise noted.  DIAGNOSTIC FINDINGS: 03/14/24  brain MRI: Acute 6 mm right splenium lacunar infarct. No acute hemorrhage or mass effect. 2. Underlying signal changes of advanced chronic small vessel disease in the bilateral white matter and deep gray nuclei.  COGNITION: Overall cognitive status: Within functional limits for tasks assessed; husband assists with subjective report    SENSATION: Pt reports slight N/T in R foot d/t neuropathy   COORDINATION: Alternating pronation/supination: slight dysmetria on L Alternating toe tap: WNL Finger to nose: WNL  Vitals: 147/96 mmHg, 96% spO2, 80bpm     POSTURE: rounded shoulders and forward head  GAIT: Findings: Assistive device utilized:Single point cane, Level of assistance: Min A, and Comments: requires assistance for balance from husband   VESTIBULAR ASSESSMENT   GENERAL OBSERVATION: pt wears glasses but not sure what kind   OCULOMOTOR EXAM: Eye alignment: L eye elevated in orbit   Ocular ROM: limited R eye abduction   Spontaneous Nystagmus: absent   Gaze-Induced Nystagmus: absent   Smooth Pursuits: difficulty tracking R and c/o diplopia at 45 degrees gaze to each side   Saccades: disconjugate eye movement with R eye overshooting/missing target repeatedly and c/o diplopia with R gaze   Convergence/Divergence: 12 inches c/o diplopia cm    VESTIBULAR - OCULAR REFLEX:    Slow VOR: Normal   VOR Cancellation: Unable to Maintain Gaze; c/o diplopia   Head-Impulse Test: HIT Right: negative HIT Left: negative    POSITIONAL TESTING:   Right Sidelying: strong anxiety response and c/o dizziness with nystagmus but  unable to identify d/t pt closing eyes Left Sidelying: negative                                                                                                                               TREATMENT DATE: 03/24/24    PATIENT EDUCATION: Education details: prognosis, POC, exam findings and need for MD f/u to address new oculomotor findings that were not documented in the hospital  Person educated: Patient and Spouse Education method: Explanation Education comprehension: verbalized understanding  HOME EXERCISE PROGRAM: Not yet initiated    GOALS: Goals reviewed with patient? Yes  SHORT TERM GOALS: Target date: 04/21/2024  Patient to be independent with initial HEP. Baseline: HEP initiated Goal status: MET    LONG TERM GOALS: Target date: 06/11/2024  Patient to be independent with advanced HEP. Baseline: Not yet initiated  Goal status: IN PROGRESS 05/07/24  Patient to report 0/10 dizziness with standing vertical and horizontal VOR for 30 seconds. Baseline: Unable; 1/10 dizziness horizontal 05/07/24 Goal status: IN PROGRESS 05/07/24  Patient will report 0/10 dizziness with bed mobility.  Baseline: Symptomatic; R roll: 2-3/10 dizziness Rolling supine 1-2/10 dizziness  R side > sit 3-4/10 dizziness 05/07/24  Goal status: IN PROGRESS 05/07/24  Patient to demonstrate ambulation for 371ft with LRAD and supervision for safety.  Baseline: min A d/t imbalance; Completed 362ft in 3 minutes with supervision 05/07/24 Goal status: MET 05/07/24  Patient to score at least 15/24  on DGI in order to decrease risk of falls. Baseline: deferred d/t safety 04/10/24; 10/24 04/29/24 Goal status: IN PROGRESS 04/29/24  Patient to score at least 40/56 on [place] in order to decrease risk of falls. Baseline: 34/56 04/10/24; 45/56 05/07/24  Goal status: MET 05/07/24    ASSESSMENT:  CLINICAL IMPRESSION: Patient arrived to session without new complaints. Session focused on dynamic balance training,  focusing on wt shifting and SLS. Also provided cueing for gait training which resulted in longer strides, more continuous and efficient stepping. HEP was updated with exercises that were safely performed today. Patient tolerated session well and without complaints at end of appointment. OBJECTIVE IMPAIRMENTS: Abnormal gait, decreased activity tolerance, decreased balance, decreased coordination, difficulty walking, and dizziness.   ACTIVITY LIMITATIONS: carrying, lifting, bending, sitting, standing, squatting, sleeping, stairs, transfers, bed mobility, bathing, toileting, dressing, reach over head, hygiene/grooming, locomotion level, and caring for others  PARTICIPATION LIMITATIONS: meal prep, cleaning, laundry, driving, shopping, community activity, yard work, and church  PERSONAL FACTORS: Age, Behavior pattern, Past/current experiences, Time since onset of injury/illness/exacerbation, and 3+ comorbidities: Anemia, anxiety, L breast CA s/p B mastectomy, depression, HLD, HTN, B TKA are also affecting patient's functional outcome.   REHAB POTENTIAL: Good  CLINICAL DECISION MAKING: Evolving/moderate complexity  EVALUATION COMPLEXITY: Moderate  PLAN:  PT FREQUENCY: 1-2x/week  PT DURATION: other: 5 weeks   PLANNED INTERVENTIONS: [id number]- PT Re-evaluation, 97750- Physical Performance Testing, 97110-Therapeutic exercises, 97530- Therapeutic activity, [id number]- Neuromuscular re-education, 97535- Self Care, [id number]- Manual therapy, [id number]- Gait training, [phone]- Canalith repositioning, Patient/Family education, Balance training, Stair training, Taping, Vestibular training, Cryotherapy, and Moist heat  PLAN FOR NEXT SESSION:  Continue to work on habituating bed mobility, head nods. Balance with narrow BOS, gait with increased foot clearance and weight shift   Louana Terrilyn Christians, [place], DPT 05/13/24 8:35 AM  [facility] at [facility] [address] [place], [address] [place], [place] [address] Phone # [phone] Fax # [phone]

## 2024-05-14 ENCOUNTER — Ambulatory Visit: Admitting: Physical Therapy

## 2024-05-14 ENCOUNTER — Encounter: Payer: Self-pay | Admitting: Physical Therapy

## 2024-05-14 ENCOUNTER — Ambulatory Visit: Admitting: Occupational Therapy

## 2024-05-14 DIAGNOSIS — R29818 Other symptoms and signs involving the nervous system: Secondary | ICD-10-CM

## 2024-05-14 DIAGNOSIS — R42 Dizziness and giddiness: Secondary | ICD-10-CM | POA: Diagnosis not present

## 2024-05-14 DIAGNOSIS — R2681 Unsteadiness on feet: Secondary | ICD-10-CM

## 2024-05-14 DIAGNOSIS — R41842 Visuospatial deficit: Secondary | ICD-10-CM

## 2024-05-14 NOTE — Therapy (Signed)
 OUTPATIENT OCCUPATIONAL THERAPY NEURO  Treatment Note  Patient Name: Vanessa Christensen MRN: [id number] DOB:[birth year], 73 y.o., female Today's Date: 05/14/2024  PCP: Mahlon Comer BRAVO, MD REFERRING PROVIDER:   Douglass Kenney NOVAK, FNP    END OF SESSION:  OT End of Session - 05/14/24 1124     Visit Number 5    Number of Visits 9    Date for Recertification  06/06/24    Authorization Type Medicare Part A&B / BCBS Fed 2025    OT Start Time 1105    OT Stop Time 1145    OT Time Calculation (min) 40 min    Activity Tolerance Patient tolerated treatment well    Behavior During Therapy WFL for tasks assessed/performed             Past Medical History:  Diagnosis Date   Anemia    hx of  iron deficient   Anxiety    pt denies   Arthritis    hands and feet   Cancer (HCC) 10/04/2006   breast    left   Cataracts, both eyes    Depression    Depression    Diverticulosis of colon    GERD (gastroesophageal reflux disease)    Glaucoma    Hyperlipidemia    Hypertension    no meds   Joint pain    Knee pain    Obesity    OSA on CPAP    Osteoarthritis    Sleep apnea    no CPAP- no longer needed d/t weight loss   Thyroid  activity decreased    Past Surgical History:  Procedure Laterality Date   BREAST SURGERY Bilateral    CATARACT EXTRACTION     COLONOSCOPY     GLAUCOMA REPAIR     MASTECTOMY, RADICAL Bilateral    neulasta induced sterile abscesses     THYROIDECTOMY, PARTIAL     TONSILLECTOMY     TOTAL KNEE ARTHROPLASTY Right    TOTAL KNEE ARTHROPLASTY Left 10/27/2022   Procedure: LEFT TOTAL KNEE ARTHROPLASTY;  Surgeon: Vernetta Lonni GRADE, MD;  Location: [facility];  Service: Orthopedics;  Laterality: Left;   TOTAL KNEE REVISION Right 05/07/2020   Procedure: RIGHT TOTAL KNEE REVISION ARTHROPLASTY;  Surgeon: Vernetta Lonni GRADE, MD;  Location: [facility];  Service: Orthopedics;  Laterality: Right;   Patient Active Problem List   Diagnosis Date Noted   CVA (cerebral  vascular accident) (HCC) 03/14/2024   Cellulitis of right leg 12/21/2023   Onychomycosis 12/21/2023   Atherosclerosis of artery 03/12/2023   Adrenal nodule 11/16/2022   Thyroid  nodule 11/16/2022   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   Generalized obesity 10/05/2022   Memory loss 06/12/2022   Essential hypertension 04/13/2022   Obesity (BMI 30-39.9) 04/13/2022   Stress incontinence 11/08/2021   Status post revision of total replacement of right knee 05/07/2020   Failed total knee, right, 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/2020   History of total right knee replacement 01/28/2020   Vitamin D  deficiency 08/29/2018   Insulin  resistance 08/29/2018   Other specified glaucoma 08/14/2018   Incontinence of urine in female 11/07/2017   Vertigo 11/07/2017   Left knee pain 10/25/2016   Genetic testing 08/08/2016   Hypothyroidism 12/02/2015   Anxiety and depression 12/02/2015   OAB (overactive bladder) 12/02/2015   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/02/2015   BMI 37.0-37.9, adult 12/02/2015    ONSET DATE: referral 04/14/24  REFERRING DIAG: H51.9 (ICD-10-CM) - Abnormal eye movements  THERAPY DIAG:  Visuospatial deficit  Other symptoms and signs involving the nervous system  Rationale for Evaluation and Treatment: Rehabilitation  SUBJECTIVE:   SUBJECTIVE STATEMENT: Pt reports that she got real nauseous during PT session with activity incorporating vertical head nods and forward/backward stepping.  Pt reporting that she did not take her nausea pill before therapy.    Pt accompanied by: self and significant other  PERTINENT HISTORY: 73 y.o. female with medical history significant of hypertension, hyperlipidemia who presented to the ED due to dizziness.  Patient had been dizzy for 24 hours which has been progressively worsening and with vomiting.  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  On PT  eval pt with disconjugate gaze with R not tracking with L eye.  PMH/o Anemia, anxiety, L breast CA s/p B mastectomy, depression, HLD, HTN, B TKA   PRECAUTIONS: Fall  WEIGHT BEARING RESTRICTIONS: No  PAIN:  Are you having pain? No  FALLS: Has patient fallen in last 6 months? Yes. Number of falls 2 - one in the backyard and one entering restaurant   LIVING ENVIRONMENT: Lives with: lives with their spouse Lives in: House/apartment Stairs: 2 step to enter with handrail; 2 story home with bedroom on 1st floor Has following equipment at home: Single point cane and Walker - 2 wheeled, built in seat in shower that she does not use, grab bar next to the toilet, in plans for remodel in bathroom to elevate toilet seat  PLOF: Independent and Independent with basic ADLs; would ask for assistance with balance when navigating curbs in the community and if dropping item  PATIENT GOALS: to walk without tripping  OBJECTIVE:  Note: Objective measures were completed at Evaluation unless otherwise noted.  HAND DOMINANCE: Right  ADLs: Transfers/ambulation related to ADLs: Mod I, will occasionally use walker when vertigo is really bad Grooming: Mod I UB Dressing: Mod I LB Dressing: husband assisting with donning socks s/p knee surgery Toileting: Mod I Bathing: Mod I Tub Shower transfers: Mod I   IADLs:  Pt is doing majority of the cooking and will do some household tasks but feels that she doesn't have the energy.  Pt reports that the lighting in stores will effect her.   MOBILITY STATUS: Hx of falls and occasional hand held assist especially when in the community, has used RW when experiencing more vertigo symptoms  POSTURE COMMENTS:  rounded shoulders and forward head   ACTIVITY TOLERANCE: Activity tolerance: decreased endurance s/p CVA  FUNCTIONAL OUTCOME MEASURES: PSFS   UPPER EXTREMITY ROM:  WFL bilaterally   UPPER EXTREMITY MMT:   Grossly 4+ to 5 overall   COORDINATION: 9  Hole Peg test: Right: 29.63 sec; Left: 33.28 sec  SENSATION: Pt reports slight N/T in R foot d/t neuropathy   COGNITION: Overall cognitive status: Within functional limits for tasks assessed  VISION: Subjective report: wears glasses 80% of the time Baseline vision: Wears glasses all the time Visual history: cataracts removal  VISION ASSESSMENT: Tracking/Visual pursuits: Right eye does not track laterally and Decreased smoothness with horizontal tracking; Diplopia with vertical tracking upwards Convergence: Impaired: reporting diplopia 6-7 from nose Visual Fields: no apparent deficits  Completed Convergence Insufficiency Symptom Survey (CISS): 21/56 21 or higher is suggestive of convergence insufficiency  Patient has difficulty with following activities due to following visual impairments: reading, tying shoes  PERCEPTION: WFL  PRAXIS: WFL  TREATMENT DATE:  05/14/24 Visual scanning with horizontal head turns and scanning. OT utilizing animal cards set to Left side of pt and stack placed at midline.  OT encouraging linear scanning for increased organization of scanning.  Transitioned to visual scanning with longer line to further challenge visual scanning.  Pt with no c/o nausea with either scanning activity this session.   Spot it: engaged in spot with field of 9 cards to locate matching icons on 3 cards.  OT encouraging horizontal and vertical scanning over sporadic scanning.  Increased challenge to speed based matching with pt completing with good visual scanning, however min increase in nausea afterwards.      05/12/24 Visual scanning/attention: pt demonstrating good attention and scanning with locating 88 with vertical scanning with use of line follow to cover up below lines.  Pt with increased difficulty with horizontal scanning R and L to locate number  that is repeated only 4 times.  Pt benefiting from line follow and cues to complete in systematic order to increase attention and carryover.  OT educating on functional implications with reading. Reading: engaged in reading 2 paragraphs in magazine with one instance of repeating a particular line but not skipping any lines when utilizing line follow to cover up below lines.   Adaptive equipment: engaged in discussion about use of page magnifier to aid in reading as pt with most difficulty with smaller font.  OT recommending use of page size or wearable option to allow use of other hand to continue with line follow.    05/07/24 Educated pt in low vision general recommendations. See Pt instructions for handout provided. Reviewed previously issued HEP for linear pursuits and convergence. Min verbal cues required for proper form.  Pt instructed in therapeutic activity honing multitasking and linear scanning, recreating peg board pattern with large pegs and board. Pt made one error placing a peg on the row above where it should go, causing the rest of the pegs to shift up one row. Pt then completed recreating pattern with small pegs and peg board to promote linear scanning as well. Recreated this pattern with no errors. Educated pt in spot the difference games to carry over with linear pursuits and other visual concerns. Pt was able to identify 2 differences in first handout, OT found 3 additional ones. Second handout pt was able to find 8/10 differences. Pt given additional handout to attempt at home with 10 differences.   PATIENT EDUCATION: Education details: visual scanning activities, HEP, and educating on use of line follow, increased font, and use of magnifiers Person educated: Patient and Spouse Education method: Explanation Education comprehension: verbalized understanding and needs further education  HOME EXERCISE PROGRAM: Access Code: [email or web address] URL: [email or web address] Date:  04/29/2024 Prepared by: [facility] - Outpatient  Rehab - [facility]  Exercises - Seated Horizontal Smooth Pursuit  - 2 x daily - 10 reps - Seated Proximal-Distal Smooth Pursuit  - 2 x daily - 10 reps   GOALS: Goals reviewed with patient? Yes  SHORT TERM GOALS: Target date: 05/16/24  Pt will independently return demo of vision HEP. Baseline: new to OPOT Goal status: in progress  2.  Pt will verbalize understanding of energy conservation strategies and recall 3 in use. Baseline: easily fatigued and decreased activity tolerance Goal status: in progress  3.  Pt will independently recall or demonstrate at least 3 vision compensation strategies. Baseline: disconjugate gaze/convergence insufficiency, and low vision Goal status: in progress   LONG TERM GOALS: Target date: 06/06/24  Patient will report at least two-point increase in average PSFS score or at least three-point increase in a single activity score indicating functionally significant improvement given minimum detectable change. Baseline: 4.3 Goal status: in progress  2.  Pt will report ability to read 15 mins with improved ease with use of AE/strategies PRN. Baseline: reports losing place when reading, diplopia during reaching Goal status: in progress  3.  Pt will report improved score on CISS to <21 for improved convergence. Baseline: 21/56 Goal status: in progress  4.  Pt will complete table top visual scanning activity at Mod I level with use of adaptive strategies PRN. Baseline: TBD Goal status: in progress   ASSESSMENT:  CLINICAL IMPRESSION: Patient is a 73 y.o. female who was seen today for occupational therapy treatment for disconjugate gaze s/p CVA. Pt reporting diplopia with vertical gaze, especially upwards, and difficulty with focus, recall, and speed with reading. Pt somewhat limited in activity this session due to having gotten extremely nauseous during PT session.  Therefore engaged in seated table  top scanning with focus on horizontal and vertical scanning.  Pt with increased nausea with pace based activity due to increased sporadic scanning.  Pt will continue to benefit from engaging in visual scanning HEP and use of AE to aid in reading.  PERFORMANCE DEFICITS: in functional skills including ADLs, IADLs, ROM, balance, endurance, decreased knowledge of precautions, decreased knowledge of use of DME, and vision and psychosocial skills including coping strategies and environmental adaptation.     PLAN:  OT FREQUENCY: 1-2x/week  OT DURATION: 6 weeks  PLANNED INTERVENTIONS: 97168 OT Re-evaluation, 97535 self care/ADL training, 02889 therapeutic exercise, 97530 therapeutic activity, 97112 neuromuscular re-education, functional mobility training, visual/perceptual remediation/compensation, psychosocial skills training, energy conservation, coping strategies training, patient/family education, and DME and/or AE instructions  RECOMMENDED OTHER SERVICES: NA  CONSULTED AND AGREED WITH PLAN OF CARE: Patient and family member/caregiver  PLAN FOR NEXT SESSION: Add onto vision HEP prn, progress table top visual scanning with peg board task, incorporate reading, continue educate on adaptive strategies and/or equipment to aid in low vision and diplopia  Educate on energy conservation and review vision compensation   KAYLENE DOMINO, OTR/L 05/14/2024, 11:24 AM  [facility] at [facility] [address], [address] [place], [place] [address] Phone # [phone] Fax # [phone]

## 2024-05-19 ENCOUNTER — Ambulatory Visit: Admitting: Occupational Therapy

## 2024-05-19 DIAGNOSIS — N3946 Mixed incontinence: Secondary | ICD-10-CM | POA: Diagnosis not present

## 2024-05-19 DIAGNOSIS — R42 Dizziness and giddiness: Secondary | ICD-10-CM | POA: Diagnosis not present

## 2024-05-19 DIAGNOSIS — R41842 Visuospatial deficit: Secondary | ICD-10-CM

## 2024-05-19 DIAGNOSIS — R29818 Other symptoms and signs involving the nervous system: Secondary | ICD-10-CM

## 2024-05-19 DIAGNOSIS — R351 Nocturia: Secondary | ICD-10-CM | POA: Diagnosis not present

## 2024-05-19 NOTE — Therapy (Signed)
 OUTPATIENT OCCUPATIONAL THERAPY NEURO  Treatment Note  Patient Name: Vanessa Christensen MRN: [id number] DOB:Jan 01, 1951, 73 y.o., female Today's Date: 05/19/2024  PCP: Mahlon Comer BRAVO, MD REFERRING PROVIDER:   Douglass Kenney NOVAK, FNP    END OF SESSION:  OT End of Session - 05/19/24 1554     Visit Number 6    Number of Visits 9    Date for Recertification  06/06/24    Authorization Type Medicare Part A&B / BCBS Fed 2025    OT Start Time 1538    OT Stop Time 1620    OT Time Calculation (min) 42 min    Activity Tolerance Patient tolerated treatment well    Behavior During Therapy WFL for tasks assessed/performed              Past Medical History:  Diagnosis Date   Anemia    hx of  iron deficient   Anxiety    pt denies   Arthritis    hands and feet   Cancer (HCC) 10/04/2006   breast    left   Cataracts, both eyes    Depression    Depression    Diverticulosis of colon    GERD (gastroesophageal reflux disease)    Glaucoma    Hyperlipidemia    Hypertension    no meds   Joint pain    Knee pain    Obesity    OSA on CPAP    Osteoarthritis    Sleep apnea    no CPAP- no longer needed d/t weight loss   Thyroid  activity decreased    Past Surgical History:  Procedure Laterality Date   BREAST SURGERY Bilateral    CATARACT EXTRACTION     COLONOSCOPY     GLAUCOMA REPAIR     MASTECTOMY, RADICAL Bilateral    neulasta induced sterile abscesses     THYROIDECTOMY, PARTIAL     TONSILLECTOMY     TOTAL KNEE ARTHROPLASTY Right    TOTAL KNEE ARTHROPLASTY Left 10/27/2022   Procedure: LEFT TOTAL KNEE ARTHROPLASTY;  Surgeon: Vernetta Lonni GRADE, MD;  Location: [facility];  Service: Orthopedics;  Laterality: Left;   TOTAL KNEE REVISION Right 05/07/2020   Procedure: RIGHT TOTAL KNEE REVISION ARTHROPLASTY;  Surgeon: Vernetta Lonni GRADE, MD;  Location: [facility];  Service: Orthopedics;  Laterality: Right;   Patient Active Problem List   Diagnosis Date Noted   CVA (cerebral  vascular accident) (HCC) 03/14/2024   Cellulitis of right leg 12/21/2023   Onychomycosis 12/21/2023   Atherosclerosis of artery 03/12/2023   Adrenal nodule 11/16/2022   Thyroid  nodule 11/16/2022   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   Generalized obesity 10/05/2022   Memory loss 06/12/2022   Essential hypertension 04/13/2022   Obesity (BMI 30-39.9) 04/13/2022   Stress incontinence 11/08/2021   Status post revision of total replacement of right knee 05/07/2020   Failed total knee, right, 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/2020   History of total right knee replacement 01/28/2020   Vitamin D  deficiency 08/29/2018   Insulin  resistance 08/29/2018   Other specified glaucoma 08/14/2018   Incontinence of urine in female 11/07/2017   Vertigo 11/07/2017   Left knee pain 10/25/2016   Genetic testing 08/08/2016   Hypothyroidism 12/02/2015   Anxiety and depression 12/02/2015   OAB (overactive bladder) 12/02/2015   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/02/2015   BMI 37.0-37.9, adult 12/02/2015    ONSET DATE: referral 04/14/24  REFERRING DIAG: H51.9 (ICD-10-CM) - Abnormal eye movements  THERAPY DIAG:  Visuospatial deficit  Other symptoms and signs involving the nervous system  Rationale for Evaluation and Treatment: Rehabilitation  SUBJECTIVE:   SUBJECTIVE STATEMENT: Pt reports that she fell in the driveway when she was going to get the mail.  She reports the driveway slopes and she started picking up speed with inability to correct and slow down.  Pt accompanied by: self and significant other  PERTINENT HISTORY: 73 y.o. female with medical history significant of hypertension, hyperlipidemia who presented to the ED due to dizziness.  Patient had been dizzy for 24 hours which has been progressively worsening and with vomiting.  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  On PT eval pt with  disconjugate gaze with R not tracking with L eye.  PMH/o Anemia, anxiety, L breast CA s/p B mastectomy, depression, HLD, HTN, B TKA   PRECAUTIONS: Fall  WEIGHT BEARING RESTRICTIONS: No  PAIN:  Are you having pain? No  FALLS: Has patient fallen in last 6 months? Yes. Number of falls 2 - one in the backyard and one entering restaurant   LIVING ENVIRONMENT: Lives with: lives with their spouse Lives in: House/apartment Stairs: 2 step to enter with handrail; 2 story home with bedroom on 1st floor Has following equipment at home: Single point cane and Walker - 2 wheeled, built in seat in shower that she does not use, grab bar next to the toilet, in plans for remodel in bathroom to elevate toilet seat  PLOF: Independent and Independent with basic ADLs; would ask for assistance with balance when navigating curbs in the community and if dropping item  PATIENT GOALS: to walk without tripping  OBJECTIVE:  Note: Objective measures were completed at Evaluation unless otherwise noted.  HAND DOMINANCE: Right  ADLs: Transfers/ambulation related to ADLs: Mod I, will occasionally use walker when vertigo is really bad Grooming: Mod I UB Dressing: Mod I LB Dressing: husband assisting with donning socks s/p knee surgery Toileting: Mod I Bathing: Mod I Tub Shower transfers: Mod I   IADLs:  Pt is doing majority of the cooking and will do some household tasks but feels that she doesn't have the energy.  Pt reports that the lighting in stores will effect her.   MOBILITY STATUS: Hx of falls and occasional hand held assist especially when in the community, has used RW when experiencing more vertigo symptoms  POSTURE COMMENTS:  rounded shoulders and forward head   ACTIVITY TOLERANCE: Activity tolerance: decreased endurance s/p CVA  FUNCTIONAL OUTCOME MEASURES: PSFS   UPPER EXTREMITY ROM:  WFL bilaterally   UPPER EXTREMITY MMT:   Grossly 4+ to 5 overall   COORDINATION: 9 Hole Peg test:  Right: 29.63 sec; Left: 33.28 sec  SENSATION: Pt reports slight N/T in R foot d/t neuropathy   COGNITION: Overall cognitive status: Within functional limits for tasks assessed  VISION: Subjective report: wears glasses 80% of the time Baseline vision: Wears glasses all the time Visual history: cataracts removal  VISION ASSESSMENT: Tracking/Visual pursuits: Right eye does not track laterally and Decreased smoothness with horizontal tracking; Diplopia with vertical tracking upwards Convergence: Impaired: reporting diplopia 6-7 from nose Visual Fields: no apparent deficits  Completed Convergence Insufficiency Symptom Survey (CISS): 21/56 21 or higher is suggestive of convergence insufficiency  Patient has difficulty with following activities due to following visual impairments: reading, tying shoes  PERCEPTION: WFL  PRAXIS: WFL  TREATMENT DATE:  05/19/24 Visual scanning: engaged in activities in sitting and standing with focus on horizontal and vertical scanning as well as progressive scanning and head turns. Peg board pattern in sitting with scanning both horizontal and vertical.  OT educating on increased organization or even chunking to allow increased success as pt with errors towards the end due to decreased sequence. Matching cards in standing challenging increased head turns and scanning to match items on vertical surface.  OT incorporating cognitive challenge to organize numerically during scanning and matching.  Pt with no reports of dizziness this session. Educated on various puzzles to engage in visual scanning and where to purchase.     05/14/24 Visual scanning with horizontal head turns and scanning. OT utilizing animal cards set to Left side of pt and stack placed at midline.  OT encouraging linear scanning for increased organization of  scanning.  Transitioned to visual scanning with longer line to further challenge visual scanning.  Pt with no c/o nausea with either scanning activity this session.   Spot it: engaged in spot with field of 9 cards to locate matching icons on 3 cards.  OT encouraging horizontal and vertical scanning over sporadic scanning.  Increased challenge to speed based matching with pt completing with good visual scanning, however min increase in nausea afterwards.      05/12/24 Visual scanning/attention: pt demonstrating good attention and scanning with locating 88 with vertical scanning with use of line follow to cover up below lines.  Pt with increased difficulty with horizontal scanning R and L to locate number that is repeated only 4 times.  Pt benefiting from line follow and cues to complete in systematic order to increase attention and carryover.  OT educating on functional implications with reading. Reading: engaged in reading 2 paragraphs in magazine with one instance of repeating a particular line but not skipping any lines when utilizing line follow to cover up below lines.   Adaptive equipment: engaged in discussion about use of page magnifier to aid in reading as pt with most difficulty with smaller font.  OT recommending use of page size or wearable option to allow use of other hand to continue with line follow.   PATIENT EDUCATION: Education details: visual scanning activities, recommendation to organize task to allow for increased visual scanning or avoiding particular movements that set off dizziness Person educated: Patient and Spouse Education method: Explanation Education comprehension: verbalized understanding and needs further education  HOME EXERCISE PROGRAM: Access Code: [email or web address] URL: [email or web address] Date: 04/29/2024 Prepared by: [facility] - Outpatient  Rehab - [facility]  Exercises - Seated Horizontal Smooth Pursuit  - 2 x daily - 10 reps - Seated  Proximal-Distal Smooth Pursuit  - 2 x daily - 10 reps   GOALS: Goals reviewed with patient? Yes  SHORT TERM GOALS: Target date: 05/16/24  Pt will independently return demo of vision HEP. Baseline: new to OPOT Goal status: in progress  2.  Pt will verbalize understanding of energy conservation strategies and recall 3 in use. Baseline: easily fatigued and decreased activity tolerance Goal status: in progress  3.  Pt will independently recall or demonstrate at least 3 vision compensation strategies. Baseline: disconjugate gaze/convergence insufficiency, and low vision Goal status: in progress   LONG TERM GOALS: Target date: 06/06/24  Patient will report at least two-point increase in average PSFS score or at least three-point increase in a single activity score indicating functionally significant improvement given minimum detectable change. Baseline: 4.3 Goal status: in progress  2.  Pt will report ability to read 15 mins with improved ease with use of AE/strategies PRN. Baseline: reports losing place when reading, diplopia during reaching Goal status: in progress  3.  Pt will report improved score on CISS to <21 for improved convergence. Baseline: 21/56 Goal status: in progress  4.  Pt will complete table top visual scanning activity at Mod I level with use of adaptive strategies PRN. Baseline: TBD Goal status: in progress   ASSESSMENT:  CLINICAL IMPRESSION: Patient is a 72 y.o. female who was seen today for occupational therapy treatment for disconjugate gaze s/p CVA. Pt reporting diplopia with vertical gaze, especially upwards, and difficulty with focus, recall, and speed with reading. Pt reporting that she has been trying to do more reading and has been intentional about font size to increase ease.  Pt demonstrating good visual scanning both vertically and horizontally without any c/o nausea this session.  Pt will continue to benefit from engaging in visual scanning HEP and  use of AE to aid in reading.  PERFORMANCE DEFICITS: in functional skills including ADLs, IADLs, ROM, balance, endurance, decreased knowledge of precautions, decreased knowledge of use of DME, and vision and psychosocial skills including coping strategies and environmental adaptation.     PLAN:  OT FREQUENCY: 1-2x/week  OT DURATION: 6 weeks  PLANNED INTERVENTIONS: 97168 OT Re-evaluation, 97535 self care/ADL training, 02889 therapeutic exercise, 97530 therapeutic activity, 97112 neuromuscular re-education, functional mobility training, visual/perceptual remediation/compensation, psychosocial skills training, energy conservation, coping strategies training, patient/family education, and DME and/or AE instructions  RECOMMENDED OTHER SERVICES: NA  CONSULTED AND AGREED WITH PLAN OF CARE: Patient and family member/caregiver  PLAN FOR NEXT SESSION: Add onto vision HEP prn, progress table top visual scanning with peg board task, incorporate reading, continue educate on adaptive strategies and/or equipment to aid in low vision and diplopia  Educate on energy conservation and review vision compensation Clock yourself in standing, increase to standing on foam   Chauntel Windsor, OTR/L 05/19/2024, 4:34 PM  [facility] Re[facility] at [facility] [address], [address] [place], [place] [address] Phone # [phone] Fax # [phone]

## 2024-05-20 ENCOUNTER — Ambulatory Visit: Payer: Self-pay

## 2024-05-20 NOTE — Telephone Encounter (Signed)
 FYI Only or Action Required?: FYI only for provider: appointment scheduled on 05/22/24.  Patient was last seen in primary care on 04/30/2024 by Delores Shields A, DO.  Called Nurse Triage reporting Fall.  Symptoms began several days ago.  Interventions attempted: Rest, hydration, or home remedies.  Symptoms are: stable.  Triage Disposition: See PCP When Office is Open (Within 3 Days)  Patient/caregiver understands and will follow disposition?: Yes Reason for Disposition  MILD weakness (e.g., does not interfere with ability to work, go to school, normal activities)  (Exception: Mild weakness is a chronic symptom.)  Answer Assessment - Initial Assessment Questions Patient states had a nurse look at it at another appointment and was advised to see PCP. Patient reports the bruise is starting to feel really hard inside.  1. MECHANISM: How did the fall happen?     Fell in driveway   2. DOMESTIC VIOLENCE AND ELDER ABUSE SCREENING: Did you fall because someone pushed you or tried to hurt you? If Yes, ask: Are you safe now?     N/a  3. ONSET: When did the fall happen? (e.g., minutes, hours, or days ago)     Last Thursday  4. LOCATION: What part of the body hit the ground? (e.g., back, buttocks, head, hips, knees, hands, head, stomach)     Left side near hip  5. INJURY: Did you hurt (injure) yourself when you fell? If Yes, ask: What did you injure? Tell me more about this? (e.g., body area; type of injury; pain severity)     Felt fine afterwards but then having bruises   6. PAIN: Is there any pain? If Yes, ask: How bad is the pain? (e.g., Scale 0-10; or none, mild,      Painful to touch  7. SIZE: For cuts, bruises, or swelling, ask: How large is it? (e.g., inches or centimeters)      5 inch size bruise  8. OTHER SYMPTOMS: Do you have any other symptoms? (e.g., dizziness, fever, weakness; new-onset or worsening).      Denies  Protocols used: Falls and  [facility]  Copied from CRM [id number]. Topic: Clinical - Red Word Triage >> May 20, 2024  8:52 AM Eva FALCON wrote: Red Word that prompted transfer to Nurse Triage: bad fall last Thursday, lump on side of stomach and is getting very hard and growing.

## 2024-05-20 NOTE — Telephone Encounter (Signed)
 bad fall last Thursday, lump on side of stomach and is getting very hard and growing.   Appt 05/22/24

## 2024-05-21 ENCOUNTER — Encounter: Payer: Self-pay | Admitting: Physical Therapy

## 2024-05-21 ENCOUNTER — Ambulatory Visit: Admitting: Physical Therapy

## 2024-05-21 ENCOUNTER — Ambulatory Visit: Admitting: Occupational Therapy

## 2024-05-21 DIAGNOSIS — R29818 Other symptoms and signs involving the nervous system: Secondary | ICD-10-CM

## 2024-05-21 DIAGNOSIS — R2681 Unsteadiness on feet: Secondary | ICD-10-CM

## 2024-05-21 DIAGNOSIS — R42 Dizziness and giddiness: Secondary | ICD-10-CM | POA: Diagnosis not present

## 2024-05-21 DIAGNOSIS — R41842 Visuospatial deficit: Secondary | ICD-10-CM

## 2024-05-21 NOTE — Therapy (Signed)
 OUTPATIENT OCCUPATIONAL THERAPY NEURO  Treatment Note  Patient Name: Vanessa Christensen MRN: [id number] DOB:Aug 01, 1950, 73 y.o., female Today's Date: 05/21/2024  PCP: Mahlon Comer BRAVO, MD REFERRING PROVIDER:   Douglass Kenney NOVAK, FNP    END OF SESSION:  OT End of Session - 05/21/24 1248     Visit Number 7    Number of Visits 9    Date for Recertification  06/06/24    Authorization Type Medicare Part A&B / BCBS Fed 2025    OT Start Time 1106    OT Stop Time 1150    OT Time Calculation (min) 44 min    Activity Tolerance Patient tolerated treatment well    Behavior During Therapy WFL for tasks assessed/performed               Past Medical History:  Diagnosis Date   Anemia    hx of  iron deficient   Anxiety    pt denies   Arthritis    hands and feet   Cancer (HCC) 10/04/2006   breast    left   Cataracts, both eyes    Depression    Depression    Diverticulosis of colon    GERD (gastroesophageal reflux disease)    Glaucoma    Hyperlipidemia    Hypertension    no meds   Joint pain    Knee pain    Obesity    OSA on CPAP    Osteoarthritis    Sleep apnea    no CPAP- no longer needed d/t weight loss   Thyroid  activity decreased    Past Surgical History:  Procedure Laterality Date   BREAST SURGERY Bilateral    CATARACT EXTRACTION     COLONOSCOPY     GLAUCOMA REPAIR     MASTECTOMY, RADICAL Bilateral    neulasta induced sterile abscesses     THYROIDECTOMY, PARTIAL     TONSILLECTOMY     TOTAL KNEE ARTHROPLASTY Right    TOTAL KNEE ARTHROPLASTY Left 10/27/2022   Procedure: LEFT TOTAL KNEE ARTHROPLASTY;  Surgeon: Vernetta Lonni GRADE, MD;  Location: [facility];  Service: Orthopedics;  Laterality: Left;   TOTAL KNEE REVISION Right 05/07/2020   Procedure: RIGHT TOTAL KNEE REVISION ARTHROPLASTY;  Surgeon: Vernetta Lonni GRADE, MD;  Location: [facility];  Service: Orthopedics;  Laterality: Right;   Patient Active Problem List   Diagnosis Date Noted   CVA (cerebral  vascular accident) (HCC) 03/14/2024   Cellulitis of right leg 12/21/2023   Onychomycosis 12/21/2023   Atherosclerosis of artery 03/12/2023   Adrenal nodule 11/16/2022   Thyroid  nodule 11/16/2022   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   Generalized obesity 10/05/2022   Memory loss 06/12/2022   Essential hypertension 04/13/2022   Obesity (BMI 30-39.9) 04/13/2022   Stress incontinence 11/08/2021   Status post revision of total replacement of right knee 05/07/2020   Failed total knee, right, 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/2020   History of total right knee replacement 01/28/2020   Vitamin D  deficiency 08/29/2018   Insulin  resistance 08/29/2018   Other specified glaucoma 08/14/2018   Incontinence of urine in female 11/07/2017   Vertigo 11/07/2017   Left knee pain 10/25/2016   Genetic testing 08/08/2016   Hypothyroidism 12/02/2015   Anxiety and depression 12/02/2015   OAB (overactive bladder) 12/02/2015   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/02/2015   BMI 37.0-37.9, adult 12/02/2015    ONSET DATE: referral 04/14/24  REFERRING DIAG: H51.9 (ICD-10-CM) - Abnormal eye  movements   THERAPY DIAG:  Visuospatial deficit  Other symptoms and signs involving the nervous system  Unsteadiness on feet  Rationale for Evaluation and Treatment: Rehabilitation  SUBJECTIVE:   SUBJECTIVE STATEMENT: Pt reports that she is seeing Dr tomorrow about hematoma on her hip s/p fall last week.  Pt accompanied by: self and significant other  PERTINENT HISTORY: 73 y.o. female with medical history significant of hypertension, hyperlipidemia who presented to the ED due to dizziness.  Patient had been dizzy for 24 hours which has been progressively worsening and with vomiting.  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  On PT eval pt with disconjugate gaze with R not tracking with L eye.  PMH/o Anemia, anxiety, L  breast CA s/p B mastectomy, depression, HLD, HTN, B TKA   PRECAUTIONS: Fall  WEIGHT BEARING RESTRICTIONS: No  PAIN:  Are you having pain? No  FALLS: Has patient fallen in last 6 months? Yes. Number of falls 2 - one in the backyard and one entering restaurant   LIVING ENVIRONMENT: Lives with: lives with their spouse Lives in: House/apartment Stairs: 2 step to enter with handrail; 2 story home with bedroom on 1st floor Has following equipment at home: Single point cane and Walker - 2 wheeled, built in seat in shower that she does not use, grab bar next to the toilet, in plans for remodel in bathroom to elevate toilet seat  PLOF: Independent and Independent with basic ADLs; would ask for assistance with balance when navigating curbs in the community and if dropping item  PATIENT GOALS: to walk without tripping  OBJECTIVE:  Note: Objective measures were completed at Evaluation unless otherwise noted.  HAND DOMINANCE: Right  ADLs: Transfers/ambulation related to ADLs: Mod I, will occasionally use walker when vertigo is really bad Grooming: Mod I UB Dressing: Mod I LB Dressing: husband assisting with donning socks s/p knee surgery Toileting: Mod I Bathing: Mod I Tub Shower transfers: Mod I   IADLs:  Pt is doing majority of the cooking and will do some household tasks but feels that she doesn't have the energy.  Pt reports that the lighting in stores will effect her.   MOBILITY STATUS: Hx of falls and occasional hand held assist especially when in the community, has used RW when experiencing more vertigo symptoms  POSTURE COMMENTS:  rounded shoulders and forward head   ACTIVITY TOLERANCE: Activity tolerance: decreased endurance s/p CVA  FUNCTIONAL OUTCOME MEASURES: PSFS   UPPER EXTREMITY ROM:  WFL bilaterally   UPPER EXTREMITY MMT:   Grossly 4+ to 5 overall   COORDINATION: 9 Hole Peg test: Right: 29.63 sec; Left: 33.28 sec  SENSATION: Pt reports slight N/T in R  foot d/t neuropathy   COGNITION: Overall cognitive status: Within functional limits for tasks assessed  VISION: Subjective report: wears glasses 80% of the time Baseline vision: Wears glasses all the time Visual history: cataracts removal  VISION ASSESSMENT: Tracking/Visual pursuits: Right eye does not track laterally and Decreased smoothness with horizontal tracking; Diplopia with vertical tracking upwards Convergence: Impaired: reporting diplopia 6-7 from nose Visual Fields: no apparent deficits  Completed Convergence Insufficiency Symptom Survey (CISS): 21/56 21 or higher is suggestive of convergence insufficiency  Patient has difficulty with following activities due to following visual impairments: reading, tying shoes  PERCEPTION: WFL  PRAXIS: WFL  TREATMENT DATE:  05/21/24 Clock yourself: engaged in visual scanning in standing while tapping called numbers initially with R hand followed by tapping with R and L hand.  Pt able to complete 50 numbers per minute for 2 mins with just right hand, but significantly increased challenge when scanning and alternating hands even when decreased to 40 numbers per minute.  Pt making errors both on R and L and no consistent quadrant was more challenging, therefore difficult to assess if vision vs attention impairment. Reading: engaged in reading passage to identify tasks needed to complete.  Pt initially reporting passage very difficult to read, therefore encouraged use of paper/bookmark for line follow.  Pt with some improvements with line follow, however still with difficulty with organization.  Pt highlighting and taking notes, however difficulty reading notes due to decreased spacing and writing nearly on top of previous information.  Pt's sister in law present during session and reporting they have observed difficulties  with recall.  OT educating on WARM strategy - writing info down, associate it, repeat it, make a mental note.  OT encouraged pt to write down the information for motor memory as well (instead of spouse writing).  Recommended use of lined paper to aid in legibility.  OT providing examples of each aspect of WARM strategy.     05/19/24 Visual scanning: engaged in activities in sitting and standing with focus on horizontal and vertical scanning as well as progressive scanning and head turns. Peg board pattern in sitting with scanning both horizontal and vertical.  OT educating on increased organization or even chunking to allow increased success as pt with errors towards the end due to decreased sequence. Matching cards in standing challenging increased head turns and scanning to match items on vertical surface.  OT incorporating cognitive challenge to organize numerically during scanning and matching.  Pt with no reports of dizziness this session. Educated on various puzzles to engage in visual scanning and where to purchase.     05/14/24 Visual scanning with horizontal head turns and scanning. OT utilizing animal cards set to Left side of pt and stack placed at midline.  OT encouraging linear scanning for increased organization of scanning.  Transitioned to visual scanning with longer line to further challenge visual scanning.  Pt with no c/o nausea with either scanning activity this session.   Spot it: engaged in spot with field of 9 cards to locate matching icons on 3 cards.  OT encouraging horizontal and vertical scanning over sporadic scanning.  Increased challenge to speed based matching with pt completing with good visual scanning, however min increase in nausea afterwards.     PATIENT EDUCATION: Education details: visual scanning activities, recommendation to organize task to allow for increased visual scanning and to increase recall, memory strategies Person educated: Patient and Spouse,  sister in law Education method: Explanation and Handouts Education comprehension: verbalized understanding and needs further education  HOME EXERCISE PROGRAM: Access Code: [email or web address] URL: [email or web address] Date: 04/29/2024 Prepared by: [facility] - Outpatient  Rehab - [facility]  Exercises - Seated Horizontal Smooth Pursuit  - 2 x daily - 10 reps - Seated Proximal-Distal Smooth Pursuit  - 2 x daily - 10 reps   GOALS: Goals reviewed with patient? Yes  SHORT TERM GOALS: Target date: 05/16/24  Pt will independently return demo of vision HEP. Baseline: new to OPOT Goal status: in progress  2.  Pt will verbalize understanding of energy conservation strategies and recall 3 in use. Baseline: easily fatigued and  decreased activity tolerance Goal status: in progress  3.  Pt will independently recall or demonstrate at least 3 vision compensation strategies. Baseline: disconjugate gaze/convergence insufficiency, and low vision Goal status: in progress   LONG TERM GOALS: Target date: 06/06/24  Patient will report at least two-point increase in average PSFS score or at least three-point increase in a single activity score indicating functionally significant improvement given minimum detectable change. Baseline: 4.3 Goal status: in progress  2.  Pt will report ability to read 15 mins with improved ease with use of AE/strategies PRN. Baseline: reports losing place when reading, diplopia during reaching Goal status: in progress  3.  Pt will report improved score on CISS to <21 for improved convergence. Baseline: 21/56 Goal status: in progress  4.  Pt will complete table top visual scanning activity at Mod I level with use of adaptive strategies PRN. Baseline: TBD Goal status: in progress   ASSESSMENT:  CLINICAL IMPRESSION: Patient is a 73 y.o. female who was seen today for occupational therapy treatment for disconjugate gaze s/p CVA. Pt reporting diplopia  with vertical gaze, especially upwards, and difficulty with focus, recall, and speed with reading. Pt with no issues of dizziness with Clock Yourself activity in standing, however demonstrating decreased attention and organization.  Pt demonstrating decreased attention and organization during reading and sequencing activity.  Pt's sister in law also reporting decreased memory, therefore educated on WARM strategy to aid in recall. Will incorporate memory strategies and sequencing tasks into remaining therapy sessions.  PERFORMANCE DEFICITS: in functional skills including ADLs, IADLs, ROM, balance, endurance, decreased knowledge of precautions, decreased knowledge of use of DME, and vision and psychosocial skills including coping strategies and environmental adaptation.     PLAN:  OT FREQUENCY: 1-2x/week  OT DURATION: 6 weeks  PLANNED INTERVENTIONS: 97168 OT Re-evaluation, 97535 self care/ADL training, 02889 therapeutic exercise, 97530 therapeutic activity, 97112 neuromuscular re-education, functional mobility training, visual/perceptual remediation/compensation, psychosocial skills training, energy conservation, coping strategies training, patient/family education, and DME and/or AE instructions  RECOMMENDED OTHER SERVICES: NA  CONSULTED AND AGREED WITH PLAN OF CARE: Patient and family member/caregiver  PLAN FOR NEXT SESSION: Add onto vision HEP prn, progress table top visual scanning with peg board task, incorporate reading, continue educate on adaptive strategies and/or equipment to aid in low vision and diplopia  Educate on energy conservation and review vision compensation  Sequencing and organization tasks.   KAYLENE DOMINO, OTR/L 05/21/2024, 12:48 PM  [facility] at [facility] [address] [place], [address] [place], [place] [address] Phone # [phone] Fax # [phone]

## 2024-05-21 NOTE — Patient Instructions (Signed)

## 2024-05-21 NOTE — Therapy (Signed)
 OUTPATIENT PHYSICAL THERAPY NEURO NOTE   Patient Name: Vanessa Christensen MRN: [id number] DOB:1951-03-08, 73 y.o., female Today's Date: 05/21/2024   PCP:    Mahlon Comer BRAVO, MD   REFERRING PROVIDER: Raenelle Donalda HERO, MD     END OF SESSION:  PT End of Session - 05/21/24 1019     Visit Number 13    Number of Visits 20    Date for Recertification  06/11/24    Authorization Type Medicare/BCBS Federal    PT Start Time 1021    PT Stop Time 1059    PT Time Calculation (min) 38 min    Equipment Utilized During Treatment Gait belt    Activity Tolerance Patient tolerated treatment well    Behavior During Therapy WFL for tasks assessed/performed                     Past Medical History:  Diagnosis Date   Anemia    hx of  iron deficient   Anxiety    pt denies   Arthritis    hands and feet   Cancer (HCC) 10/04/2006   breast    left   Cataracts, both eyes    Depression    Depression    Diverticulosis of colon    GERD (gastroesophageal reflux disease)    Glaucoma    Hyperlipidemia    Hypertension    no meds   Joint pain    Knee pain    Obesity    OSA on CPAP    Osteoarthritis    Sleep apnea    no CPAP- no longer needed d/t weight loss   Thyroid  activity decreased    Past Surgical History:  Procedure Laterality Date   BREAST SURGERY Bilateral    CATARACT EXTRACTION     COLONOSCOPY     GLAUCOMA REPAIR     MASTECTOMY, RADICAL Bilateral    neulasta induced sterile abscesses     THYROIDECTOMY, PARTIAL     TONSILLECTOMY     TOTAL KNEE ARTHROPLASTY Right    TOTAL KNEE ARTHROPLASTY Left 10/27/2022   Procedure: LEFT TOTAL KNEE ARTHROPLASTY;  Surgeon: Vernetta Lonni GRADE, MD;  Location: [facility];  Service: Orthopedics;  Laterality: Left;   TOTAL KNEE REVISION Right 05/07/2020   Procedure: RIGHT TOTAL KNEE REVISION ARTHROPLASTY;  Surgeon: Vernetta Lonni GRADE, MD;  Location: [facility];  Service: Orthopedics;  Laterality: Right;   Patient Active  Problem List   Diagnosis Date Noted   CVA (cerebral vascular accident) (HCC) 03/14/2024   Cellulitis of right leg 12/21/2023   Onychomycosis 12/21/2023   Atherosclerosis of artery 03/12/2023   Adrenal nodule 11/16/2022   Thyroid  nodule 11/16/2022   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   Generalized obesity 10/05/2022   Memory loss 06/12/2022   Essential hypertension 04/13/2022   Obesity (BMI 30-39.9) 04/13/2022   Stress incontinence 11/08/2021   Status post revision of total replacement of right knee 05/07/2020   Failed total knee, right, 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/2020   History of total right knee replacement 01/28/2020   Vitamin D  deficiency 08/29/2018   Insulin  resistance 08/29/2018   Other specified glaucoma 08/14/2018   Incontinence of urine in female 11/07/2017   Vertigo 11/07/2017   Left knee pain 10/25/2016   Genetic testing 08/08/2016   Hypothyroidism 12/02/2015   Anxiety and depression 12/02/2015   OAB (overactive bladder) 12/02/2015   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/02/2015   BMI 37.0-37.9, adult 12/02/2015  ONSET DATE: 10/10/125  REFERRING DIAG: I63.9 (ICD-10-CM) - CVA (cerebral vascular accident) (HCC)  THERAPY DIAG:  Dizziness and giddiness  Unsteadiness on feet  Rationale for Evaluation and Treatment: Rehabilitation  SUBJECTIVE:                                                                                                                                                                                             SUBJECTIVE STATEMENT: Had a great day at therapy last week, and I fell coming back from the mailbox going down incline.  My feet started going faster and I fell.  I have a place on my L abdomen near trunk and it is hard and warm-to see the doctor about this tomorrow.      Pt accompanied by: family member-sister  PERTINENT HISTORY: Anemia, anxiety, L breast CA s/p B mastectomy, depression,  HLD, HTN, B TKA  PAIN:  Are you having pain? No  PRECAUTIONS: Fall  RED FLAGS: None   WEIGHT BEARING RESTRICTIONS: No  FALLS: Has patient fallen in last 6 months? Yes. Number of falls 1  LIVING ENVIRONMENT: Lives with: lives with their spouse Lives in: House/apartment Stairs: 2 step to enter with handrail; 2 story home with bedroom on 1st floor Has following equipment at home: Single point cane and Walker - 2 wheeled  PLOF: Independent  PATIENT GOALS: improve dizziness  OBJECTIVE:   Feel pretty good.  Probably 3-4/10 symptoms of off-balance.  Groggy today, maybe 3/10.  (More since fall)  Denies hitting head or blacking out TODAY'S TREATMENT: 05/21/2024 Activity Comments  Vitals: 144/85, HR 82 bpm   Gait training 50 ft x 2, then  85 ft x 3 with SPC and small quad tip base Cues for sequence with cane; slowed pace with turns supervision  Forward/back stepping at counter Cues to sequence with LLE stepping, especially backwards  Minisquats to up on toes, x 10 reps BUE support at counter  Standing on decline of ramp:  marching in place x 10 Forward step taps to ground, x 10   Gait with cane, 85 ft with supervision Cues for sequence        PATIENT EDUCATION: Education details:05/21/2024:   Discussed mechanisms of latest fall 05/15/24 and discussed/recommended use of cane or walker for safety at all times. Person educated: Patient, sister Education method: Explanation, Demonstration, Tactile cues, Verbal cues, and Handouts Education comprehension: verbalized understanding and returned demonstration      HOME EXERCISE PROGRAM Last updated: 05/14/24 Access Code: [email or web address] URL: [email or web address] Date: 05/14/2024 Prepared by: [facility] - Outpatient  Rehab - [facility]  Program Notes perform at counter  top for safety  Exercises - Corner Balance Feet Together: Eyes Open With Head Turns  - 1 x daily - 7 x weekly - 3 sets - 30 sec hold -  Standing with Head Nod  - 1 x daily - 5 x weekly - 3 sets - 30 sec hold - Corner Balance Feet Together: Eyes Closed With Head Turns  - 1 x daily - 7 x weekly - 3 sets - 30 sec hold - Standing Toe Taps  - 1 x daily - 5 x weekly - 2 sets - 10 reps - Alternating Step Forward with Support  - 1 x daily - 5 x weekly - 2 sets - 10 reps     Note: Objective measures were completed at Evaluation unless otherwise noted.  DIAGNOSTIC FINDINGS: 03/14/24 brain MRI: Acute 6 mm right splenium lacunar infarct. No acute hemorrhage or mass effect. 2. Underlying signal changes of advanced chronic small vessel disease in the bilateral white matter and deep gray nuclei.  COGNITION: Overall cognitive status: Within functional limits for tasks assessed; husband assists with subjective report    SENSATION: Pt reports slight N/T in R foot d/t neuropathy   COORDINATION: Alternating pronation/supination: slight dysmetria on L Alternating toe tap: WNL Finger to nose: WNL  Vitals: 147/96 mmHg, 96% spO2, 80bpm     POSTURE: rounded shoulders and forward head  GAIT: Findings: Assistive device utilized:Single point cane, Level of assistance: Min A, and Comments: requires assistance for balance from husband   VESTIBULAR ASSESSMENT   GENERAL OBSERVATION: pt wears glasses but not sure what kind   OCULOMOTOR EXAM: Eye alignment: L eye elevated in orbit   Ocular ROM: limited R eye abduction   Spontaneous Nystagmus: absent   Gaze-Induced Nystagmus: absent   Smooth Pursuits: difficulty tracking R and c/o diplopia at 45 degrees gaze to each side   Saccades: disconjugate eye movement with R eye overshooting/missing target repeatedly and c/o diplopia with R gaze   Convergence/Divergence: 12 inches c/o diplopia cm    VESTIBULAR - OCULAR REFLEX:    Slow VOR: Normal   VOR Cancellation: Unable to Maintain Gaze; c/o diplopia   Head-Impulse Test: HIT Right: negative HIT Left: negative    POSITIONAL TESTING:    Right Sidelying: strong anxiety response and c/o dizziness with nystagmus but unable to identify d/t pt closing eyes Left Sidelying: negative                                                                                                                               TREATMENT DATE: 03/24/24    PATIENT EDUCATION: Education details: prognosis, POC, exam findings and need for MD f/u to address new oculomotor findings that were not documented in the hospital  Person educated: Patient and Spouse Education method: Explanation Education comprehension: verbalized understanding  HOME EXERCISE PROGRAM: Not yet initiated    GOALS: Goals reviewed with patient? Yes  SHORT TERM GOALS: Target date: 04/21/2024  Patient  to be independent with initial HEP. Baseline: HEP initiated Goal status: MET    LONG TERM GOALS: Target date: 06/11/2024  Patient to be independent with advanced HEP. Baseline: Not yet initiated  Goal status: IN PROGRESS 05/07/24  Patient to report 0/10 dizziness with standing vertical and horizontal VOR for 30 seconds. Baseline: Unable; 1/10 dizziness horizontal 05/07/24 Goal status: IN PROGRESS 05/07/24  Patient will report 0/10 dizziness with bed mobility.  Baseline: Symptomatic; R roll: 2-3/10 dizziness Rolling supine 1-2/10 dizziness  R side > sit 3-4/10 dizziness 05/07/24  Goal status: IN PROGRESS 05/07/24  Patient to demonstrate ambulation for 332ft with LRAD and supervision for safety.  Baseline: min A d/t imbalance; Completed 344ft in 3 minutes with supervision 05/07/24 Goal status: MET 05/07/24  Patient to score at least 15/24 on DGI in order to decrease risk of falls. Baseline: deferred d/t safety 04/10/24; 10/24 04/29/24 Goal status: IN PROGRESS 04/29/24  Patient to score at least 40/56 on Berg in order to decrease risk of falls. Baseline: 34/56 04/10/24; 45/56 05/07/24  Goal status: MET 05/07/24    ASSESSMENT:  CLINICAL IMPRESSION: Pt presents today  with reports of fall walking back from mailbox on 05/15/24.  She notes feeling so good that she did too much, and she was not using assistive device Skilled PT session focused on education to pt/family present (sister) to use some type of device at all times, given continued balance and instability issues.  Also focused on gait training with cane as well as balance activities.  She needs consistent cues for sequencing with cane.  No complaints at end of session today.  Pt will continue to benefit from skilled PT towards goals for improved functional mobility and decreased fall risk.  OBJECTIVE IMPAIRMENTS: Abnormal gait, decreased activity tolerance, decreased balance, decreased coordination, difficulty walking, and dizziness.   ACTIVITY LIMITATIONS: carrying, lifting, bending, sitting, standing, squatting, sleeping, stairs, transfers, bed mobility, bathing, toileting, dressing, reach over head, hygiene/grooming, locomotion level, and caring for others  PARTICIPATION LIMITATIONS: meal prep, cleaning, laundry, driving, shopping, community activity, yard work, and church  PERSONAL FACTORS: Age, Behavior pattern, Past/current experiences, Time since onset of injury/illness/exacerbation, and 3+ comorbidities: Anemia, anxiety, L breast CA s/p B mastectomy, depression, HLD, HTN, B TKA are also affecting patient's functional outcome.   REHAB POTENTIAL: Good  CLINICAL DECISION MAKING: Evolving/moderate complexity  EVALUATION COMPLEXITY: Moderate  PLAN:  PT FREQUENCY: 1-2x/week  PT DURATION: other: 5 weeks   PLANNED INTERVENTIONS: 97164- PT Re-evaluation, 97750- Physical Performance Testing, 97110-Therapeutic exercises, 97530- Therapeutic activity, [id number]- Neuromuscular re-education, 97535- Self Care, [id number]- Manual therapy, [id number]- Gait training, [phone]- Canalith repositioning, Patient/Family education, Balance training, Stair training, Taping, Vestibular training, Cryotherapy, and Moist heat  PLAN FOR  NEXT SESSION:  Reinforce use of cane/assistive device with gait.  Continue to work on habituating bed mobility, head nods. Balance with narrow BOS, gait with increased foot clearance and weight shift   Greig Anon, PT 05/21/2024 11:00 AM Phone: [phone] Fax: [phone]  [facility] at [facility] [address] [place], [place] [address] Phone # [phone] Fax # [phone]

## 2024-05-22 ENCOUNTER — Ambulatory Visit: Admitting: Family Medicine

## 2024-05-22 ENCOUNTER — Encounter: Payer: Self-pay | Admitting: Family Medicine

## 2024-05-22 VITALS — BP 132/60 | HR 94 | Temp 98.1°F | Ht 62.0 in | Wt 200.8 lb

## 2024-05-22 DIAGNOSIS — L03311 Cellulitis of abdominal wall: Secondary | ICD-10-CM

## 2024-05-22 DIAGNOSIS — S3011XA Contusion of abdominal wall, initial encounter: Secondary | ICD-10-CM | POA: Diagnosis not present

## 2024-05-22 LAB — CBC WITH DIFFERENTIAL/PLATELET
Basophils Absolute: 0 K/uL (ref 0.0–0.1)
Basophils Relative: 0.4 % (ref 0.0–3.0)
Eosinophils Absolute: 0 K/uL (ref 0.0–0.7)
Eosinophils Relative: 0 % (ref 0.0–5.0)
HCT: 34.9 % — ABNORMAL LOW (ref 36.0–46.0)
Hemoglobin: 11.9 g/dL — ABNORMAL LOW (ref 12.0–15.0)
Lymphocytes Relative: 17.2 % (ref 12.0–46.0)
Lymphs Abs: 1.5 K/uL (ref 0.7–4.0)
MCHC: 34.2 g/dL (ref 30.0–36.0)
MCV: 85.4 fl (ref 78.0–100.0)
Monocytes Absolute: 2 K/uL — ABNORMAL HIGH (ref 0.1–1.0)
Monocytes Relative: 23 % — ABNORMAL HIGH (ref 3.0–12.0)
Neutro Abs: 5.3 K/uL (ref 1.4–7.7)
Neutrophils Relative %: 59.4 % (ref 43.0–77.0)
Platelets: 175 K/uL (ref 150.0–400.0)
RBC: 4.09 Mil/uL (ref 3.87–5.11)
RDW: 16.2 % — ABNORMAL HIGH (ref 11.5–15.5)
WBC: 8.9 K/uL (ref 4.0–10.5)

## 2024-05-22 LAB — BASIC METABOLIC PANEL WITH GFR
BUN: 12 mg/dL (ref 6–23)
CO2: 28 meq/L (ref 19–32)
Calcium: 9.6 mg/dL (ref 8.4–10.5)
Chloride: 103 meq/L (ref 96–112)
Creatinine, Ser: 0.68 mg/dL (ref 0.40–1.20)
GFR: 85.15 mL/min (ref >60.00)
Glucose, Bld: 101 mg/dL — ABNORMAL HIGH (ref 70–99)
Potassium: 4 meq/L (ref 3.5–5.1)
Sodium: 139 meq/L (ref 135–145)

## 2024-05-22 LAB — HEPATIC FUNCTION PANEL
ALT: 16 U/L (ref 3–35)
AST: 15 U/L (ref 5–37)
Albumin: 4.3 g/dL (ref 3.5–5.2)
Alkaline Phosphatase: 60 U/L (ref 39–117)
Bilirubin, Direct: 0 mg/dL — ABNORMAL LOW (ref 0.1–0.3)
Total Bilirubin: 0.5 mg/dL (ref 0.2–1.2)
Total Protein: 7.2 g/dL (ref 6.0–8.3)

## 2024-05-22 MED ORDER — ONDANSETRON HCL 4 MG PO TABS: 4.0000 mg | ORAL_TABLET | Freq: Three times a day (TID) | ORAL | 0 refills | Status: AC | PRN

## 2024-05-22 MED ORDER — DOXYCYCLINE HYCLATE 100 MG PO TABS: 100.0000 mg | ORAL_TABLET | Freq: Two times a day (BID) | ORAL | 0 refills | Status: AC

## 2024-05-22 NOTE — Patient Instructions (Addendum)
 Follow up as needed or as scheduled START the Doxycycline  twice daily- take w/ food- for infection HEAT the area to help the body reabsorb the hematoma Call with any questions or concerns Stay Safe!  Stay Healthy! Happy Holidays!!!

## 2024-05-22 NOTE — Progress Notes (Unsigned)
° °  Subjective:    Patient ID: Vanessa Christensen, female    DOB: [birth year], 73 y.o.   MRN: [id number]  HPI Hematoma- fell 12/11 in the driveway.  Now has large, hard area on L side of abdomen.  Area is warm, TTP.  On ASA, no longer on Plavix .   Review of Systems For ROS see HPI     Objective:   Physical Exam        Assessment & Plan:

## 2024-05-23 ENCOUNTER — Ambulatory Visit: Payer: Self-pay | Admitting: Family Medicine

## 2024-05-23 ENCOUNTER — Other Ambulatory Visit: Payer: Self-pay | Admitting: Urology

## 2024-05-23 ENCOUNTER — Other Ambulatory Visit: Payer: Self-pay

## 2024-05-23 DIAGNOSIS — D72821 Monocytosis (symptomatic): Secondary | ICD-10-CM

## 2024-05-26 ENCOUNTER — Ambulatory Visit: Admitting: Occupational Therapy

## 2024-05-26 ENCOUNTER — Ambulatory Visit: Admitting: Physical Therapy

## 2024-05-26 ENCOUNTER — Encounter: Payer: Self-pay | Admitting: Physical Therapy

## 2024-05-26 DIAGNOSIS — R42 Dizziness and giddiness: Secondary | ICD-10-CM

## 2024-05-26 DIAGNOSIS — R2681 Unsteadiness on feet: Secondary | ICD-10-CM

## 2024-05-26 DIAGNOSIS — R29818 Other symptoms and signs involving the nervous system: Secondary | ICD-10-CM

## 2024-05-26 DIAGNOSIS — R41842 Visuospatial deficit: Secondary | ICD-10-CM

## 2024-05-26 NOTE — Therapy (Signed)
 " OUTPATIENT PHYSICAL THERAPY NEURO NOTE   Patient Name: Vanessa Christensen MRN: [id number] DOB:1951/04/29, 73 y.o., female Today's Date: 05/26/2024   PCP:    Mahlon Comer BRAVO, MD   REFERRING PROVIDER: Raenelle Donalda HERO, MD     END OF SESSION:  PT End of Session - 05/26/24 1143     Visit Number 14    Number of Visits 20    Date for Recertification  06/11/24    Authorization Type Medicare/BCBS Federal    PT Start Time 1149    PT Stop Time 1227    PT Time Calculation (min) 38 min    Equipment Utilized During Treatment Gait belt    Activity Tolerance Patient tolerated treatment well    Behavior During Therapy WFL for tasks assessed/performed                     Past Medical History:  Diagnosis Date   Anemia    hx of  iron deficient   Anxiety    pt denies   Arthritis    hands and feet   Cancer (HCC) 10/04/2006   breast    left   Cataracts, both eyes    Depression    Depression    Diverticulosis of colon    GERD (gastroesophageal reflux disease)    Glaucoma    Hyperlipidemia    Hypertension    no meds   Joint pain    Knee pain    Obesity    OSA on CPAP    Osteoarthritis    Sleep apnea    no CPAP- no longer needed d/t weight loss   Thyroid  activity decreased    Past Surgical History:  Procedure Laterality Date   BREAST SURGERY Bilateral    CATARACT EXTRACTION     COLONOSCOPY     GLAUCOMA REPAIR     MASTECTOMY, RADICAL Bilateral    neulasta induced sterile abscesses     THYROIDECTOMY, PARTIAL     TONSILLECTOMY     TOTAL KNEE ARTHROPLASTY Right    TOTAL KNEE ARTHROPLASTY Left 10/27/2022   Procedure: LEFT TOTAL KNEE ARTHROPLASTY;  Surgeon: Vernetta Lonni GRADE, MD;  Location: [facility];  Service: Orthopedics;  Laterality: Left;   TOTAL KNEE REVISION Right 05/07/2020   Procedure: RIGHT TOTAL KNEE REVISION ARTHROPLASTY;  Surgeon: Vernetta Lonni GRADE, MD;  Location: [facility];  Service: Orthopedics;  Laterality: Right;   Patient Active  Problem List   Diagnosis Date Noted   CVA (cerebral vascular accident) (HCC) 03/14/2024   Cellulitis of right leg 12/21/2023   Onychomycosis 12/21/2023   Atherosclerosis of artery 03/12/2023   Adrenal nodule 11/16/2022   Thyroid  nodule 11/16/2022   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   Generalized obesity 10/05/2022   Memory loss 06/12/2022   Essential hypertension 04/13/2022   Obesity (BMI 30-39.9) 04/13/2022   Stress incontinence 11/08/2021   Status post revision of total replacement of right knee 05/07/2020   Failed total knee, right, 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/2020   History of total right knee replacement 01/28/2020   Vitamin D  deficiency 08/29/2018   Insulin  resistance 08/29/2018   Other specified glaucoma 08/14/2018   Incontinence of urine in female 11/07/2017   Vertigo 11/07/2017   Left knee pain 10/25/2016   Genetic testing 08/08/2016   Hypothyroidism 12/02/2015   Anxiety and depression 12/02/2015   OAB (overactive bladder) 12/02/2015   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/02/2015   BMI 37.0-37.9, adult 12/02/2015  ONSET DATE: 10/10/125  REFERRING DIAG: I63.9 (ICD-10-CM) - CVA (cerebral vascular accident) (HCC)  THERAPY DIAG:  Dizziness and giddiness  Unsteadiness on feet  Rationale for Evaluation and Treatment: Rehabilitation  SUBJECTIVE:                                                                                                                                                                                             SUBJECTIVE STATEMENT: Went to MD and on antibiotics now for the hematoma and it is better. No dizziness or grogginess today.  Sometimes, like Sunday, I feel like everything in my vision is moving faster than it should, but it passes faster.     Pt accompanied by: family member-spouse  PERTINENT HISTORY: Anemia, anxiety, L breast CA s/p B mastectomy, depression, HLD, HTN, B TKA  PAIN:   Are you having pain? No  PRECAUTIONS: Fall  RED FLAGS: None   WEIGHT BEARING RESTRICTIONS: No  FALLS: Has patient fallen in last 6 months? Yes. Number of falls 1  LIVING ENVIRONMENT: Lives with: lives with their spouse Lives in: House/apartment Stairs: 2 step to enter with handrail; 2 story home with bedroom on 1st floor Has following equipment at home: Single point cane and Walker - 2 wheeled  PLOF: Independent  PATIENT GOALS: improve dizziness  OBJECTIVE:   Pt brings in new cane today TODAY'S TREATMENT: 05/26/2024 Activity Comments  Gait with cane (tripod base): 300 ft, then 175 ft  With added conversation tasks, occasional cues for increased step length; initial cues for gait sequence  Gait with head turns Gait with head nods With cane-brief stop of head in midline, more imbalance with faster head motion  Forward/back walking at counter, 5 reps Cues for foot placement, heel/toe pattern forward, equal step length backwards  Sidestep along counter, 3 reps R and L   Gait with no device, 20 ft x 4 reps Cues for arm swing-pt has harder time with reciprocal arm swing     Access Code: [email or web address] URL: [email or web address] Date: 05/26/2024 Prepared by: [facility]patient  [facility] - [facility]  Program Notes perform at counter top for safety  Exercises - Corner Balance Feet Together: Eyes Open With Head Turns  - 1 x daily - 7 x weekly - 3 sets - 30 sec hold - Standing with Head Nod  - 1 x daily - 5 x weekly - 3 sets - 30 sec hold - Corner Balance Feet Together: Eyes Closed With Head Turns  - 1 x daily - 7 x weekly - 3 sets - 30 sec hold - Standing Toe Taps  - 1 x  daily - 5 x weekly - 2 sets - 10 reps - Alternating Step Forward with Support  - 1 x daily - 5 x weekly - 2 sets - 10 reps - Backward Walking with Counter Support  - 1 x daily - 5 x weekly - 1 sets - 3-5 reps - Side Stepping with Counter Support  - 1 x daily - 5 x weekly - 1 sets - 3-5  reps         PATIENT EDUCATION: Education details:05/26/2024:  Updates to HEP Person educated: Patient, husband Education method: Explanation, Demonstration, Tactile cues, Verbal cues, and Handouts Education comprehension: verbalized understanding and returned demonstration          Note: Objective measures were completed at Evaluation unless otherwise noted.  DIAGNOSTIC FINDINGS: 03/14/24 brain MRI: Acute 6 mm right splenium lacunar infarct. No acute hemorrhage or mass effect. 2. Underlying signal changes of advanced chronic small vessel disease in the bilateral white matter and deep gray nuclei.  COGNITION: Overall cognitive status: Within functional limits for tasks assessed; husband assists with subjective report    SENSATION: Pt reports slight N/T in R foot d/t neuropathy   COORDINATION: Alternating pronation/supination: slight dysmetria on L Alternating toe tap: WNL Finger to nose: WNL  Vitals: 147/96 mmHg, 96% spO2, 80bpm     POSTURE: rounded shoulders and forward head  GAIT: Findings: Assistive device utilized:Single point cane, Level of assistance: Min A, and Comments: requires assistance for balance from husband   VESTIBULAR ASSESSMENT   GENERAL OBSERVATION: pt wears glasses but not sure what kind   OCULOMOTOR EXAM: Eye alignment: L eye elevated in orbit   Ocular ROM: limited R eye abduction   Spontaneous Nystagmus: absent   Gaze-Induced Nystagmus: absent   Smooth Pursuits: difficulty tracking R and c/o diplopia at 45 degrees gaze to each side   Saccades: disconjugate eye movement with R eye overshooting/missing target repeatedly and c/o diplopia with R gaze   Convergence/Divergence: 12 inches c/o diplopia cm    VESTIBULAR - OCULAR REFLEX:    Slow VOR: Normal   VOR Cancellation: Unable to Maintain Gaze; c/o diplopia   Head-Impulse Test: HIT Right: negative HIT Left: negative    POSITIONAL TESTING:   Right Sidelying: strong anxiety  response and c/o dizziness with nystagmus but unable to identify d/t pt closing eyes Left Sidelying: negative                                                                                                                               TREATMENT DATE: 03/24/24    PATIENT EDUCATION: Education details: prognosis, POC, exam findings and need for MD f/u to address new oculomotor findings that were not documented in the hospital  Person educated: Patient and Spouse Education method: Explanation Education comprehension: verbalized understanding  HOME EXERCISE PROGRAM: Not yet initiated    GOALS: Goals reviewed with patient? Yes  SHORT TERM GOALS: Target date: 04/21/2024  Patient to be  independent with initial HEP. Baseline: HEP initiated Goal status: MET    LONG TERM GOALS: Target date: 06/11/2024  Patient to be independent with advanced HEP. Baseline: Not yet initiated  Goal status: IN PROGRESS 05/07/24  Patient to report 0/10 dizziness with standing vertical and horizontal VOR for 30 seconds. Baseline: Unable; 1/10 dizziness horizontal 05/07/24 Goal status: IN PROGRESS 05/07/24  Patient will report 0/10 dizziness with bed mobility.  Baseline: Symptomatic; R roll: 2-3/10 dizziness Rolling supine 1-2/10 dizziness  R side > sit 3-4/10 dizziness 05/07/24  Goal status: IN PROGRESS 05/07/24  Patient to demonstrate ambulation for 333ft with LRAD and supervision for safety.  Baseline: min A d/t imbalance; Completed 361ft in 3 minutes with supervision 05/07/24 Goal status: MET 05/07/24  Patient to score at least 15/24 on DGI in order to decrease risk of falls. Baseline: deferred d/t safety 04/10/24; 10/24 04/29/24 Goal status: IN PROGRESS 04/29/24  Patient to score at least 40/56 on Berg in order to decrease risk of falls. Baseline: 34/56 04/10/24; 45/56 05/07/24  Goal status: MET 05/07/24    ASSESSMENT:  CLINICAL IMPRESSION: Pt presents today with no new complaints, overall  reports more good days than bad days. Skilled PT session focused on gait with cane, as pt brings in her new tripod base cane.  Worked on heel/toe pattern of gait training with gait, with addition of conversation and head motion tasks; with addition of head motions, pt has more veering with gait.  She is able to quickly stop/reset cane sequence. Updates HEP to include dynamic balance at counter to practice more fluid heel/toe pattern with gait and less lateral sway.  No dizziness at end of session, only fatigue.  Pt will continue to benefit from skilled PT towards goals for improved functional mobility and decreased fall risk.    OBJECTIVE IMPAIRMENTS: Abnormal gait, decreased activity tolerance, decreased balance, decreased coordination, difficulty walking, and dizziness.   ACTIVITY LIMITATIONS: carrying, lifting, bending, sitting, standing, squatting, sleeping, stairs, transfers, bed mobility, bathing, toileting, dressing, reach over head, hygiene/grooming, locomotion level, and caring for others  PARTICIPATION LIMITATIONS: meal prep, cleaning, laundry, driving, shopping, community activity, yard work, and church  PERSONAL FACTORS: Age, Behavior pattern, Past/current experiences, Time since onset of injury/illness/exacerbation, and 3+ comorbidities: Anemia, anxiety, L breast CA s/p B mastectomy, depression, HLD, HTN, B TKA are also affecting patient's functional outcome.   REHAB POTENTIAL: Good  CLINICAL DECISION MAKING: Evolving/moderate complexity  EVALUATION COMPLEXITY: Moderate  PLAN:  PT FREQUENCY: 1-2x/week  PT DURATION: other: 5 weeks   PLANNED INTERVENTIONS: [id number]- PT Re-evaluation, 97750- Physical Performance Testing, 97110-Therapeutic exercises, 97530- Therapeutic activity, [id number]- Neuromuscular re-education, 97535- Self Care, [id number]- Manual therapy, [phone]- Gait training, [phone]- Canalith repositioning, Patient/Family education, Balance training, Stair training, Taping, Vestibular  training, Cryotherapy, and Moist heat  PLAN FOR NEXT SESSION:  Continue to work on habituating bed mobility, head nods: (Ask how this is going at home).  Check HEP additions.  Balance with narrow BOS, gait with increased foot clearance and weight shift. Progressing to obstacle negotiation, head motion with gait.   Greig Anon, PT 05/26/2024 11:49 AM Phone: [phone] Fax: [phone]  [facility] at [facility] [address] [place], [address] [place], [place] [address] Phone # [phone] Fax # [phone]        "

## 2024-05-26 NOTE — Therapy (Signed)
 " OUTPATIENT OCCUPATIONAL THERAPY NEURO  Treatment Note  Patient Name: Vanessa Christensen MRN: [id number] DOB:1950/10/25, 73 y.o., female Today's Date: 05/27/2024  PCP: Mahlon Comer BRAVO, MD REFERRING PROVIDER:   Douglass Kenney NOVAK, FNP    END OF SESSION:  OT End of Session - 05/26/24 1111     Visit Number 8    Number of Visits 9    Date for Recertification  06/06/24    Authorization Type Medicare Part A&B / BCBS Fed 2025    OT Start Time 1106    OT Stop Time 1148    OT Time Calculation (min) 42 min    Activity Tolerance Patient tolerated treatment well    Behavior During Therapy WFL for tasks assessed/performed                Past Medical History:  Diagnosis Date   Anemia    hx of  iron deficient   Anxiety    pt denies   Arthritis    hands and feet   Cancer (HCC) 10/04/2006   breast    left   Cataracts, both eyes    Depression    Depression    Diverticulosis of colon    GERD (gastroesophageal reflux disease)    Glaucoma    Hyperlipidemia    Hypertension    no meds   Joint pain    Knee pain    Obesity    OSA on CPAP    Osteoarthritis    Sleep apnea    no CPAP- no longer needed d/t weight loss   Thyroid  activity decreased    Past Surgical History:  Procedure Laterality Date   BREAST SURGERY Bilateral    CATARACT EXTRACTION     COLONOSCOPY     GLAUCOMA REPAIR     MASTECTOMY, RADICAL Bilateral    neulasta induced sterile abscesses     THYROIDECTOMY, PARTIAL     TONSILLECTOMY     TOTAL KNEE ARTHROPLASTY Right    TOTAL KNEE ARTHROPLASTY Left 10/27/2022   Procedure: LEFT TOTAL KNEE ARTHROPLASTY;  Surgeon: Vernetta Lonni GRADE, MD;  Location: [facility];  Service: Orthopedics;  Laterality: Left;   TOTAL KNEE REVISION Right 05/07/2020   Procedure: RIGHT TOTAL KNEE REVISION ARTHROPLASTY;  Surgeon: Vernetta Lonni GRADE, MD;  Location: [facility];  Service: Orthopedics;  Laterality: Right;   Patient Active Problem List   Diagnosis Date Noted   CVA (cerebral  vascular accident) (HCC) 03/14/2024   Cellulitis of right leg 12/21/2023   Onychomycosis 12/21/2023   Atherosclerosis of artery 03/12/2023   Adrenal nodule 11/16/2022   Thyroid  nodule 11/16/2022   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   Generalized obesity 10/05/2022   Memory loss 06/12/2022   Essential hypertension 04/13/2022   Obesity (BMI 30-39.9) 04/13/2022   Stress incontinence 11/08/2021   Status post revision of total replacement of right knee 05/07/2020   Failed total knee, right, 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/2020   History of total right knee replacement 01/28/2020   Vitamin D  deficiency 08/29/2018   Insulin  resistance 08/29/2018   Other specified glaucoma 08/14/2018   Incontinence of urine in female 11/07/2017   Vertigo 11/07/2017   Left knee pain 10/25/2016   Genetic testing 08/08/2016   Hypothyroidism 12/02/2015   Anxiety and depression 12/02/2015   OAB (overactive bladder) 12/02/2015   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/02/2015   BMI 37.0-37.9, adult 12/02/2015    ONSET DATE: referral 04/14/24  REFERRING DIAG: H51.9 (ICD-10-CM) -  Abnormal eye movements   THERAPY DIAG:  Visuospatial deficit  Other symptoms and signs involving the nervous system  Rationale for Evaluation and Treatment: Rehabilitation  SUBJECTIVE:   SUBJECTIVE STATEMENT: Pt reports that she was started on an antibiotic for 7 days.    Pt accompanied by: self and significant other  PERTINENT HISTORY: 73 y.o. female with medical history significant of hypertension, hyperlipidemia who presented to the ED due to dizziness.  Patient had been dizzy for 24 hours which has been progressively worsening and with vomiting.  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  On PT eval pt with disconjugate gaze with R not tracking with L eye.  PMH/o Anemia, anxiety, L breast CA s/p B mastectomy, depression, HLD, HTN, B  TKA   PRECAUTIONS: Fall  WEIGHT BEARING RESTRICTIONS: No  PAIN:  Are you having pain? No  FALLS: Has patient fallen in last 6 months? Yes. Number of falls 2 - one in the backyard and one entering restaurant   LIVING ENVIRONMENT: Lives with: lives with their spouse Lives in: House/apartment Stairs: 2 step to enter with handrail; 2 story home with bedroom on 1st floor Has following equipment at home: Single point cane and Walker - 2 wheeled, built in seat in shower that she does not use, grab bar next to the toilet, in plans for remodel in bathroom to elevate toilet seat  PLOF: Independent and Independent with basic ADLs; would ask for assistance with balance when navigating curbs in the community and if dropping item  PATIENT GOALS: to walk without tripping  OBJECTIVE:  Note: Objective measures were completed at Evaluation unless otherwise noted.  HAND DOMINANCE: Right  ADLs: Transfers/ambulation related to ADLs: Mod I, will occasionally use walker when vertigo is really bad Grooming: Mod I UB Dressing: Mod I LB Dressing: husband assisting with donning socks s/p knee surgery Toileting: Mod I Bathing: Mod I Tub Shower transfers: Mod I   IADLs:  Pt is doing majority of the cooking and will do some household tasks but feels that she doesn't have the energy.  Pt reports that the lighting in stores will effect her.   MOBILITY STATUS: Hx of falls and occasional hand held assist especially when in the community, has used RW when experiencing more vertigo symptoms  POSTURE COMMENTS:  rounded shoulders and forward head   ACTIVITY TOLERANCE: Activity tolerance: decreased endurance s/p CVA  FUNCTIONAL OUTCOME MEASURES: PSFS   UPPER EXTREMITY ROM:  WFL bilaterally   UPPER EXTREMITY MMT:   Grossly 4+ to 5 overall   COORDINATION: 9 Hole Peg test: Right: 29.63 sec; Left: 33.28 sec  SENSATION: Pt reports slight N/T in R foot d/t neuropathy   COGNITION: Overall cognitive  status: Within functional limits for tasks assessed  VISION: Subjective report: wears glasses 80% of the time Baseline vision: Wears glasses all the time Visual history: cataracts removal  VISION ASSESSMENT: Tracking/Visual pursuits: Right eye does not track laterally and Decreased smoothness with horizontal tracking; Diplopia with vertical tracking upwards Convergence: Impaired: reporting diplopia 6-7 from nose Visual Fields: no apparent deficits  Completed Convergence Insufficiency Symptom Survey (CISS): 21/56 21 or higher is suggestive of convergence insufficiency  Patient has difficulty with following activities due to following visual impairments: reading, tying shoes  PERCEPTION: WFL  PRAXIS: WFL  TREATMENT DATE:  05/26/24 Medication management: engaged in problem solving supports to aid in recall of medications and routine to increase correct taking of medications.  Pt reporting difficulty remembering if she took antibiotic.  OT educating on use of grid for checks and balances to check off after taking, educated on PillMap or other checklists to keep up with medication types and purposes and frequency.  Pt and spouse receptive to recommendations.   Sequencing: engaged in problem solving sequencing and organization of routine tasks as pt reporting decreased motivation and/or awareness of time.  Discussed sequencing of events to ensure arrival to appointments on time and completion of various tasks in timely manner.  Of note, pt with anxiety and reporting that she often finds herself worrying about things that may get her off focus or keep her from completing a task. Pt does have a prescription for anxiety but is not taking in routinely, encouraged pt to f/u with MD if questions about her current medication regimen to allow increased engagement in tasks she would  have been completing previously.      05/21/24 Clock yourself: engaged in visual scanning in standing while tapping called numbers initially with R hand followed by tapping with R and L hand.  Pt able to complete 50 numbers per minute for 2 mins with just right hand, but significantly increased challenge when scanning and alternating hands even when decreased to 40 numbers per minute.  Pt making errors both on R and L and no consistent quadrant was more challenging, therefore difficult to assess if vision vs attention impairment. Reading: engaged in reading passage to identify tasks needed to complete.  Pt initially reporting passage very difficult to read, therefore encouraged use of paper/bookmark for line follow.  Pt with some improvements with line follow, however still with difficulty with organization.  Pt highlighting and taking notes, however difficulty reading notes due to decreased spacing and writing nearly on top of previous information.  Pt's sister in law present during session and reporting they have observed difficulties with recall.  OT educating on WARM strategy - writing info down, associate it, repeat it, make a mental note.  OT encouraged pt to write down the information for motor memory as well (instead of spouse writing).  Recommended use of lined paper to aid in legibility.  OT providing examples of each aspect of WARM strategy.     05/19/24 Visual scanning: engaged in activities in sitting and standing with focus on horizontal and vertical scanning as well as progressive scanning and head turns. Peg board pattern in sitting with scanning both horizontal and vertical.  OT educating on increased organization or even chunking to allow increased success as pt with errors towards the end due to decreased sequence. Matching cards in standing challenging increased head turns and scanning to match items on vertical surface.  OT incorporating cognitive challenge to organize  numerically during scanning and matching.  Pt with no reports of dizziness this session. Educated on various puzzles to engage in visual scanning and where to purchase.     PATIENT EDUCATION: Education details: medication management, sequencing/organization Person educated: Patient and Spouse Education method: Explanation and Handouts Education comprehension: verbalized understanding and needs further education  HOME EXERCISE PROGRAM: Access Code: [email or web address] URL: [email or web address] Date: 04/29/2024 Prepared by: [facility] - [facility]  Exercises - Seated Horizontal Smooth Pursuit  - 2 x daily - 10 reps - Seated Proximal-Distal Smooth Pursuit  - 2 x daily - 10  reps   GOALS: Goals reviewed with patient? Yes  SHORT TERM GOALS: Target date: 05/16/24  Pt will independently return demo of vision HEP. Baseline: new to OPOT Goal status: in progress  2.  Pt will verbalize understanding of energy conservation strategies and recall 3 in use. Baseline: easily fatigued and decreased activity tolerance Goal status: in progress  3.  Pt will independently recall or demonstrate at least 3 vision compensation strategies. Baseline: disconjugate gaze/convergence insufficiency, and low vision Goal status: in progress   LONG TERM GOALS: Target date: 06/06/24  Patient will report at least two-point increase in average PSFS score or at least three-point increase in a single activity score indicating functionally significant improvement given minimum detectable change. Baseline: 4.3 Goal status: in progress  2.  Pt will report ability to read 15 mins with improved ease with use of AE/strategies PRN. Baseline: reports losing place when reading, diplopia during reaching Goal status: in progress  3.  Pt will report improved score on CISS to <21 for improved convergence. Baseline: 21/56 Goal status: in progress  4.  Pt will complete table top visual  scanning activity at Mod I level with use of adaptive strategies PRN. Baseline: TBD Goal status: in progress   ASSESSMENT:  CLINICAL IMPRESSION: Patient is a 73 y.o. female who was seen today for occupational therapy treatment for disconjugate gaze s/p CVA. Pt also demonstrating and reporting difficulties with attention, motivation, memory that impact her engagement in routine tasks.  Pt receptive to education this session with use of check lists and/or grids to increase recall and organization of taking medications.  Pt continues to demonstrate impairments in organization and recall and may benefit from additional therapy services as initial POC focusing on vision compensation.    PERFORMANCE DEFICITS: in functional skills including ADLs, IADLs, ROM, balance, endurance, decreased knowledge of precautions, decreased knowledge of use of DME, and vision and psychosocial skills including coping strategies and environmental adaptation.     PLAN:  OT FREQUENCY: 1-2x/week  OT DURATION: 6 weeks  PLANNED INTERVENTIONS: 97168 OT Re-evaluation, 97535 self care/ADL training, 02889 therapeutic exercise, 97530 therapeutic activity, 97112 neuromuscular re-education, functional mobility training, visual/perceptual remediation/compensation, psychosocial skills training, energy conservation, coping strategies training, patient/family education, and DME and/or AE instructions  RECOMMENDED OTHER SERVICES: NA  CONSULTED AND AGREED WITH PLAN OF CARE: Patient and family member/caregiver  PLAN FOR NEXT SESSION: Add onto vision HEP prn, progress table top visual scanning with peg board task, incorporate reading, continue educate on adaptive strategies and/or equipment to aid in low vision and diplopia  Educate on energy conservation and review vision compensation  Sequencing and organization tasks.  Review goals and recert to include goals for time management, organization, memory, and medication management  (could be done on 12/24 or 12/29 if need be)   KAYLENE DOMINO, OTR/L 05/27/2024, 8:20 AM  [facility] at [facility] [address] [place], [place] [address] Phone # [phone] Fax # [phone]          "

## 2024-05-28 ENCOUNTER — Ambulatory Visit

## 2024-05-28 DIAGNOSIS — R278 Other lack of coordination: Secondary | ICD-10-CM

## 2024-05-28 DIAGNOSIS — R41842 Visuospatial deficit: Secondary | ICD-10-CM

## 2024-05-28 DIAGNOSIS — R2681 Unsteadiness on feet: Secondary | ICD-10-CM

## 2024-05-28 DIAGNOSIS — R42 Dizziness and giddiness: Secondary | ICD-10-CM

## 2024-05-28 DIAGNOSIS — R41844 Frontal lobe and executive function deficit: Secondary | ICD-10-CM

## 2024-05-28 DIAGNOSIS — R29818 Other symptoms and signs involving the nervous system: Secondary | ICD-10-CM

## 2024-05-28 DIAGNOSIS — R262 Difficulty in walking, not elsewhere classified: Secondary | ICD-10-CM

## 2024-05-28 NOTE — Patient Instructions (Signed)

## 2024-05-28 NOTE — Therapy (Signed)
 " OUTPATIENT PHYSICAL THERAPY NEURO NOTE   Patient Name: Vanessa Christensen MRN: [id number] DOB:Mar 08, 1951, 73 y.o., female Today's Date: 05/28/2024   PCP:    Mahlon Comer BRAVO, MD   REFERRING PROVIDER: Raenelle Donalda HERO, MD     END OF SESSION:  PT End of Session - 05/28/24 1532     Visit Number 15    Number of Visits 20    Date for Recertification  06/11/24    Authorization Type Medicare/BCBS Federal    PT Start Time 1530    PT Stop Time 1615    PT Time Calculation (min) 45 min    Equipment Utilized During Treatment Gait belt    Activity Tolerance Patient tolerated treatment well    Behavior During Therapy WFL for tasks assessed/performed                     Past Medical History:  Diagnosis Date   Anemia    hx of  iron deficient   Anxiety    pt denies   Arthritis    hands and feet   Cancer (HCC) 10/04/2006   breast    left   Cataracts, both eyes    Depression    Depression    Diverticulosis of colon    GERD (gastroesophageal reflux disease)    Glaucoma    Hyperlipidemia    Hypertension    no meds   Joint pain    Knee pain    Obesity    OSA on CPAP    Osteoarthritis    Sleep apnea    no CPAP- no longer needed d/t weight loss   Thyroid  activity decreased    Past Surgical History:  Procedure Laterality Date   BREAST SURGERY Bilateral    CATARACT EXTRACTION     COLONOSCOPY     GLAUCOMA REPAIR     MASTECTOMY, RADICAL Bilateral    neulasta induced sterile abscesses     THYROIDECTOMY, PARTIAL     TONSILLECTOMY     TOTAL KNEE ARTHROPLASTY Right    TOTAL KNEE ARTHROPLASTY Left 10/27/2022   Procedure: LEFT TOTAL KNEE ARTHROPLASTY;  Surgeon: Vernetta Lonni GRADE, MD;  Location: [facility];  Service: Orthopedics;  Laterality: Left;   TOTAL KNEE REVISION Right 05/07/2020   Procedure: RIGHT TOTAL KNEE REVISION ARTHROPLASTY;  Surgeon: Vernetta Lonni GRADE, MD;  Location: [facility];  Service: Orthopedics;  Laterality: Right;   Patient Active  Problem List   Diagnosis Date Noted   CVA (cerebral vascular accident) (HCC) 03/14/2024   Cellulitis of right leg 12/21/2023   Onychomycosis 12/21/2023   Atherosclerosis of artery 03/12/2023   Adrenal nodule 11/16/2022   Thyroid  nodule 11/16/2022   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   Generalized obesity 10/05/2022   Memory loss 06/12/2022   Essential hypertension 04/13/2022   Obesity (BMI 30-39.9) 04/13/2022   Stress incontinence 11/08/2021   Status post revision of total replacement of right knee 05/07/2020   Failed total knee, right, 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/2020   History of total right knee replacement 01/28/2020   Vitamin D  deficiency 08/29/2018   Insulin  resistance 08/29/2018   Other specified glaucoma 08/14/2018   Incontinence of urine in female 11/07/2017   Vertigo 11/07/2017   Left knee pain 10/25/2016   Genetic testing 08/08/2016   Hypothyroidism 12/02/2015   Anxiety and depression 12/02/2015   OAB (overactive bladder) 12/02/2015   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/02/2015   BMI 37.0-37.9, adult 12/02/2015  ONSET DATE: 10/10/125  REFERRING DIAG: I63.9 (ICD-10-CM) - CVA (cerebral vascular accident) (HCC)  THERAPY DIAG:  Dizziness and giddiness  Unsteadiness on feet  Difficulty in walking, not elsewhere classified  Rationale for Evaluation and Treatment: Rehabilitation  SUBJECTIVE:                                                                                                                                                                                             SUBJECTIVE STATEMENT:     Pt accompanied by: family member-spouse  PERTINENT HISTORY: Anemia, anxiety, L breast CA s/p B mastectomy, depression, HLD, HTN, B TKA  PAIN:  Are you having pain? No  PRECAUTIONS: Fall  RED FLAGS: None   WEIGHT BEARING RESTRICTIONS: No  FALLS: Has patient fallen in last 6 months? Yes. Number of falls  1  LIVING ENVIRONMENT: Lives with: lives with their spouse Lives in: House/apartment Stairs: 2 step to enter with handrail; 2 story home with bedroom on 1st floor Has following equipment at home: Single point cane and Walker - 2 wheeled  PLOF: Independent  PATIENT GOALS: improve dizziness  OBJECTIVE:   TODAY'S TREATMENT: 05/28/24 Activity Comments  Resisted walking 10-15# cable x 2 min To mimic demands of walking down hill  Forward bending and repeated turns habituation No dizziness  Dynamic and reactive balance activities Firm and compliant surfaces, obstacles, dual-tasking  Multisensory and static balance For single limb support              Pt brings in new cane today TODAY'S TREATMENT: 05/26/2024 Activity Comments  Gait with cane (tripod base): 300 ft, then 175 ft  With added conversation tasks, occasional cues for increased step length; initial cues for gait sequence  Gait with head turns Gait with head nods With cane-brief stop of head in midline, more imbalance with faster head motion  Forward/back walking at counter, 5 reps Cues for foot placement, heel/toe pattern forward, equal step length backwards  Sidestep along counter, 3 reps R and L   Gait with no device, 20 ft x 4 reps Cues for arm swing-pt has harder time with reciprocal arm swing     Access Code: [email or web address] URL: [email or web address] Date: 05/26/2024 Prepared by: [facility] - Outpatient  Rehab - [facility]  Program Notes perform at counter top for safety  Exercises - Corner Balance Feet Together: Eyes Open With Head Turns  - 1 x daily - 7 x weekly - 3 sets - 30 sec hold - Standing with Head Nod  - 1 x daily - 5 x weekly - 3 sets - 30 sec hold - Corner Balance Feet  Together: Eyes Closed With Head Turns  - 1 x daily - 7 x weekly - 3 sets - 30 sec hold - Standing Toe Taps  - 1 x daily - 5 x weekly - 2 sets - 10 reps - Alternating Step Forward with Support  - 1 x daily - 5 x weekly - 2  sets - 10 reps - Backward Walking with Counter Support  - 1 x daily - 5 x weekly - 1 sets - 3-5 reps - Side Stepping with Counter Support  - 1 x daily - 5 x weekly - 1 sets - 3-5 reps         PATIENT EDUCATION: Education details:05/26/2024:  Updates to HEP Person educated: Patient, husband Education method: Explanation, Demonstration, Tactile cues, Verbal cues, and Handouts Education comprehension: verbalized understanding and returned demonstration          Note: Objective measures were completed at Evaluation unless otherwise noted.  DIAGNOSTIC FINDINGS: 03/14/24 brain MRI: Acute 6 mm right splenium lacunar infarct. No acute hemorrhage or mass effect. 2. Underlying signal changes of advanced chronic small vessel disease in the bilateral white matter and deep gray nuclei.  COGNITION: Overall cognitive status: Within functional limits for tasks assessed; husband assists with subjective report    SENSATION: Pt reports slight N/T in R foot d/t neuropathy   COORDINATION: Alternating pronation/supination: slight dysmetria on L Alternating toe tap: WNL Finger to nose: WNL  Vitals: 147/96 mmHg, 96% spO2, 80bpm     POSTURE: rounded shoulders and forward head  GAIT: Findings: Assistive device utilized:Single point cane, Level of assistance: Min A, and Comments: requires assistance for balance from husband   VESTIBULAR ASSESSMENT   GENERAL OBSERVATION: pt wears glasses but not sure what kind   OCULOMOTOR EXAM: Eye alignment: L eye elevated in orbit   Ocular ROM: limited R eye abduction   Spontaneous Nystagmus: absent   Gaze-Induced Nystagmus: absent   Smooth Pursuits: difficulty tracking R and c/o diplopia at 45 degrees gaze to each side   Saccades: disconjugate eye movement with R eye overshooting/missing target repeatedly and c/o diplopia with R gaze   Convergence/Divergence: 12 inches c/o diplopia cm    VESTIBULAR - OCULAR REFLEX:    Slow VOR: Normal   VOR  Cancellation: Unable to Maintain Gaze; c/o diplopia   Head-Impulse Test: HIT Right: negative HIT Left: negative    POSITIONAL TESTING:   Right Sidelying: strong anxiety response and c/o dizziness with nystagmus but unable to identify d/t pt closing eyes Left Sidelying: negative                                                                                                                               TREATMENT DATE: 03/24/24    PATIENT EDUCATION: Education details: prognosis, POC, exam findings and need for MD f/u to address new oculomotor findings that were not documented in the hospital  Person educated: Patient and Spouse Education method: Explanation Education  comprehension: verbalized understanding  HOME EXERCISE PROGRAM: Not yet initiated    GOALS: Goals reviewed with patient? Yes  SHORT TERM GOALS: Target date: 04/21/2024  Patient to be independent with initial HEP. Baseline: HEP initiated Goal status: MET    LONG TERM GOALS: Target date: 06/11/2024  Patient to be independent with advanced HEP. Baseline: Not yet initiated  Goal status: IN PROGRESS 05/07/24  Patient to report 0/10 dizziness with standing vertical and horizontal VOR for 30 seconds. Baseline: Unable; 1/10 dizziness horizontal 05/07/24 Goal status: IN PROGRESS 05/07/24  Patient will report 0/10 dizziness with bed mobility.  Baseline: Symptomatic; R roll: 2-3/10 dizziness Rolling supine 1-2/10 dizziness  R side > sit 3-4/10 dizziness 05/07/24  Goal status: IN PROGRESS 05/07/24  Patient to demonstrate ambulation for 333ft with LRAD and supervision for safety.  Baseline: min A d/t imbalance; Completed 344ft in 3 minutes with supervision 05/07/24 Goal status: MET 05/07/24  Patient to score at least 15/24 on DGI in order to decrease risk of falls. Baseline: deferred d/t safety 04/10/24; 10/24 04/29/24 Goal status: IN PROGRESS 04/29/24  Patient to score at least 40/56 on Berg in order to decrease risk  of falls. Baseline: 34/56 04/10/24; 45/56 05/07/24  Goal status: MET 05/07/24    ASSESSMENT:  CLINICAL IMPRESSION: Reports minimal issues of dizziness at present but reports ongoing balance and mobility deficits with unsteadiness noted.  Trials of walking against resistance to mimic demands of walking down hill. Balance activities to promote single limb stance, step height over obstacles, and various surfaces to facilitate righting reactions and reactive balance strategies requiring CGA-min A with instances of retro-LOB.  Activities with dual-tasking during dynamic balance requiring slower speed of activity but able to sustain.  Notes reduced issue of dizziness or visual disturbance and performed repeated forward bending and rotation tasks without provocation.  Overall, demonstrating Fair dynamic standing balance.   OBJECTIVE IMPAIRMENTS: Abnormal gait, decreased activity tolerance, decreased balance, decreased coordination, difficulty walking, and dizziness.   ACTIVITY LIMITATIONS: carrying, lifting, bending, sitting, standing, squatting, sleeping, stairs, transfers, bed mobility, bathing, toileting, dressing, reach over head, hygiene/grooming, locomotion level, and caring for others  PARTICIPATION LIMITATIONS: meal prep, cleaning, laundry, driving, shopping, community activity, yard work, and church  PERSONAL FACTORS: Age, Behavior pattern, Past/current experiences, Time since onset of injury/illness/exacerbation, and 3+ comorbidities: Anemia, anxiety, L breast CA s/p B mastectomy, depression, HLD, HTN, B TKA are also affecting patient's functional outcome.   REHAB POTENTIAL: Good  CLINICAL DECISION MAKING: Evolving/moderate complexity  EVALUATION COMPLEXITY: Moderate  PLAN:  PT FREQUENCY: 1-2x/week  PT DURATION: other: 5 weeks   PLANNED INTERVENTIONS: [id number]- PT Re-evaluation, 97750- Physical Performance Testing, 97110-Therapeutic exercises, 97530- Therapeutic activity, [id number]-  Neuromuscular re-education, 97535- Self Care, [id number]- Manual therapy, [phone]- Gait training, [phone]- Canalith repositioning, Patient/Family education, Balance training, Stair training, Taping, Vestibular training, Cryotherapy, and Moist heat  PLAN FOR NEXT SESSION:  Continue to work on habituating bed mobility, head nods: (Ask how this is going at home).  Check HEP additions.  Balance with narrow BOS, gait with increased foot clearance and weight shift. Progressing to obstacle negotiation, head motion with gait.   4:53 PM, 05/28/2024 M. Kelly Durelle Zepeda, PT, DPT Physical Therapist- [place] Office Number: [phone]         "

## 2024-05-28 NOTE — Therapy (Signed)
 " OUTPATIENT OCCUPATIONAL THERAPY NEURO  Treatment Note AND REC-CERT  Patient Name: Vanessa Christensen MRN: [id number] DOB:[birth year], 73 y.o., female Today's Date: 05/28/2024  PCP: Mahlon Comer BRAVO, MD REFERRING PROVIDER:   Douglass Kenney NOVAK, FNP   Dates of Reporting Period: 04/24/24 to 05/28/2024. Pt has met 2/3 STGs and 2/4  LTGs. Pt care will continue to focus on visual and memory remediation until max rehab potential met and pt is no longer making progress towards goals. Pt demonstrates Fair understanding of HEP and Fair carry-over of education provided.   END OF SESSION:  OT End of Session - 05/28/24 1614     Visit Number 9    Number of Visits 9    Date for Recertification  06/06/24    Authorization Type Medicare Part A&B / BCBS Fed 2025    OT Start Time 1450    OT Stop Time 1530    OT Time Calculation (min) 40 min    Behavior During Therapy Anxious;WFL for tasks assessed/performed                 Past Medical History:  Diagnosis Date   Anemia    hx of  iron deficient   Anxiety    pt denies   Arthritis    hands and feet   Cancer (HCC) 10/04/2006   breast    left   Cataracts, both eyes    Depression    Depression    Diverticulosis of colon    GERD (gastroesophageal reflux disease)    Glaucoma    Hyperlipidemia    Hypertension    no meds   Joint pain    Knee pain    Obesity    OSA on CPAP    Osteoarthritis    Sleep apnea    no CPAP- no longer needed d/t weight loss   Thyroid  activity decreased    Past Surgical History:  Procedure Laterality Date   BREAST SURGERY Bilateral    CATARACT EXTRACTION     COLONOSCOPY     GLAUCOMA REPAIR     MASTECTOMY, RADICAL Bilateral    neulasta induced sterile abscesses     THYROIDECTOMY, PARTIAL     TONSILLECTOMY     TOTAL KNEE ARTHROPLASTY Right    TOTAL KNEE ARTHROPLASTY Left 10/27/2022   Procedure: LEFT TOTAL KNEE ARTHROPLASTY;  Surgeon: Vernetta Lonni GRADE, MD;  Location: [facility];  Service: Orthopedics;   Laterality: Left;   TOTAL KNEE REVISION Right 05/07/2020   Procedure: RIGHT TOTAL KNEE REVISION ARTHROPLASTY;  Surgeon: Vernetta Lonni GRADE, MD;  Location: [facility];  Service: Orthopedics;  Laterality: Right;   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   History of total right knee replacement 01/28/2020   Vitamin D  deficiency 08/29/2018   Insulin  resistance 08/29/2018   Other specified glaucoma 08/14/2018   Incontinence of urine in female 11/07/2017   Vertigo 11/07/2017   Left knee pain 10/25/2016   Genetic testing 08/08/2016  Hypothyroidism 12/02/2015   Anxiety and depression 12/02/2015   OAB (overactive bladder) 12/02/2015   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/02/2015   BMI 37.0-37.9, adult 12/02/2015    ONSET DATE: referral 04/14/24  REFERRING DIAG: H51.9 (ICD-10-CM) - Abnormal eye movements   THERAPY DIAG:  Visuospatial deficit  Other symptoms and signs involving the nervous system  Other lack of coordination  Frontal lobe and executive function deficit  Rationale for Evaluation and Treatment: Rehabilitation  SUBJECTIVE:   SUBJECTIVE STATEMENT: Pt reports that she was started on an antibiotic for 7 days.    Pt accompanied by: self and significant other  PERTINENT HISTORY: 73 y.o. female with medical history significant of hypertension, hyperlipidemia who presented to  the ED due to dizziness.  Patient had been dizzy for 24 hours which has been progressively worsening and with vomiting.  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  On PT eval pt with disconjugate gaze with R not tracking with L eye.  PMH/o Anemia, anxiety, L breast CA s/p B mastectomy, depression, HLD, HTN, B TKA   PRECAUTIONS: Fall  WEIGHT BEARING RESTRICTIONS: No  PAIN:  Are you having pain? No  FALLS: Has patient fallen in last 6 months? Yes. Number of falls 2 - one in the backyard and one entering restaurant   LIVING ENVIRONMENT: Lives with: lives with their spouse Lives in: House/apartment Stairs: 2 step to enter with handrail; 2 story home with bedroom on 1st floor Has following equipment at home: Single point cane and Walker - 2 wheeled, built in seat in shower that she does not use, grab bar next to the toilet, in plans for remodel in bathroom to elevate toilet seat  PLOF: Independent and Independent with basic ADLs; would ask for assistance with balance when navigating curbs in the community and if dropping item  PATIENT GOALS: to walk without tripping  OBJECTIVE:  Note: Objective measures were completed at Evaluation unless otherwise noted.  HAND DOMINANCE: Right  ADLs: Transfers/ambulation related to ADLs: Mod I, will occasionally use walker when vertigo is really bad Grooming: Mod I UB Dressing: Mod I LB Dressing: husband assisting with donning socks s/p knee surgery Toileting: Mod I Bathing: Mod I Tub Shower transfers: Mod I   IADLs:  Pt is doing majority of the cooking and will do some household tasks but feels that she doesn't have the energy.  Pt reports that the lighting in stores will effect her.   MOBILITY STATUS: Hx of falls and occasional hand held assist especially when in the community, has used RW when experiencing more vertigo symptoms  POSTURE COMMENTS:  rounded shoulders and forward head    ACTIVITY TOLERANCE: Activity tolerance: decreased endurance s/p CVA  FUNCTIONAL OUTCOME MEASURES: PSFS   UPPER EXTREMITY ROM:  WFL bilaterally   UPPER EXTREMITY MMT:   Grossly 4+ to 5 overall   COORDINATION: 9 Hole Peg test: Right: 29.63 sec; Left: 33.28 sec  SENSATION: Pt reports slight N/T in R foot d/t neuropathy   COGNITION: Overall cognitive status: Within functional limits for tasks assessed  VISION: Subjective report: wears glasses 80% of the time Baseline vision: Wears glasses all the time Visual history: cataracts removal  VISION ASSESSMENT: Tracking/Visual pursuits: Right eye does not track laterally and Decreased smoothness with horizontal tracking; Diplopia with vertical tracking upwards Convergence: Impaired: reporting diplopia 6-7 from nose Visual Fields: no apparent deficits  Completed Convergence Insufficiency Symptom  Survey (CISS): 21/56 21 or higher is suggestive of convergence insufficiency  Patient has difficulty with following activities due to following visual impairments: reading, tying shoes  PERCEPTION: WFL  PRAXIS: WFL                                                                                                                             TREATMENT DATE:  87/75/74 Re-cert completed this date, as pt reports difficulty with med mgmt and memory. Tested pt ability to complete simulated med mgmt task, 50% accuracy approximately. Pt reports she has pill box at home with just morning and night, whereas this one had 4 for each day.  05/26/24 Medication management: engaged in problem solving supports to aid in recall of medications and routine to increase correct taking of medications.  Pt reporting difficulty remembering if she took antibiotic.  OT educating on use of grid for checks and balances to check off after taking, educated on PillMap or other checklists to keep up with medication types and purposes and frequency.  Pt and spouse  receptive to recommendations.   Sequencing: engaged in problem solving sequencing and organization of routine tasks as pt reporting decreased motivation and/or awareness of time.  Discussed sequencing of events to ensure arrival to appointments on time and completion of various tasks in timely manner.  Of note, pt with anxiety and reporting that she often finds herself worrying about things that may get her off focus or keep her from completing a task. Pt does have a prescription for anxiety but is not taking in routinely, encouraged pt to f/u with MD if questions about her current medication regimen to allow increased engagement in tasks she would have been completing previously.      05/21/24 Clock yourself: engaged in visual scanning in standing while tapping called numbers initially with R hand followed by tapping with R and L hand.  Pt able to complete 50 numbers per minute for 2 mins with just right hand, but significantly increased challenge when scanning and alternating hands even when decreased to 40 numbers per minute.  Pt making errors both on R and L and no consistent quadrant was more challenging, therefore difficult to assess if vision vs attention impairment. Reading: engaged in reading passage to identify tasks needed to complete.  Pt initially reporting passage very difficult to read, therefore encouraged use of paper/bookmark for line follow.  Pt with some improvements with line follow, however still with difficulty with organization.  Pt highlighting and taking notes, however difficulty reading notes due to decreased spacing and writing nearly on top of previous information.  Pt's sister in law present during session and reporting they have observed difficulties with recall.  OT educating on WARM strategy - writing info down, associate it, repeat it, make a mental note.  OT encouraged pt to write down the information for motor memory as well (instead of spouse writing).  Recommended use  of lined paper to aid  in legibility.  OT providing examples of each aspect of WARM strategy.     05/19/24 Visual scanning: engaged in activities in sitting and standing with focus on horizontal and vertical scanning as well as progressive scanning and head turns. Peg board pattern in sitting with scanning both horizontal and vertical.  OT educating on increased organization or even chunking to allow increased success as pt with errors towards the end due to decreased sequence. Matching cards in standing challenging increased head turns and scanning to match items on vertical surface.  OT incorporating cognitive challenge to organize numerically during scanning and matching.  Pt with no reports of dizziness this session. Educated on various puzzles to engage in visual scanning and where to purchase.     PATIENT EDUCATION: Education details: medication management, sequencing/organization Person educated: Patient and Spouse Education method: Explanation and Handouts Education comprehension: verbalized understanding and needs further education  HOME EXERCISE PROGRAM: Access Code: [email or web address] URL: [email or web address] Date: 04/29/2024 Prepared by: [facility] - Outpatient  Rehab - [facility]  Exercises - Seated Horizontal Smooth Pursuit  - 2 x daily - 10 reps - Seated Proximal-Distal Smooth Pursuit  - 2 x daily - 10 reps   GOALS: Goals reviewed with patient? Yes  SHORT TERM GOALS: Target date: 05/16/24  Pt will independently return demo of vision HEP. Baseline: new to OPOT Goal status:MET   2.  Pt will verbalize understanding of energy conservation strategies and recall 3 in use. Baseline: easily fatigued and decreased activity tolerance Goal status: NOT MET   3.  Pt will independently recall or demonstrate at least 3 vision compensation strategies. Baseline: disconjugate gaze/convergence insufficiency, and low vision Goal status: MET    LONG TERM GOALS:  Target date: 06/06/24  Patient will report at least two-point increase in average PSFS score or at least three-point increase in a single activity score indicating functionally significant improvement given minimum detectable change. Baseline: 4.3 05/28/24: 7.3 average Goal status: MET  2.  Pt will report ability to read 15 mins with improved ease with use of AE/strategies PRN. Baseline: reports losing place when reading, diplopia during reaching 05/28/24: Pt reports completing for 15 minutes with improved ease Goal status: MET  3.  Pt will report improved score on CISS to <21 for improved convergence. Baseline: 21/56 Goal status: not met   4.  Pt will complete table top visual scanning activity at Mod I level with use of adaptive strategies PRN. Baseline: TBD Goal status: not met   SHORT TERM GOAL: Target Date 06/28/24 1  Pt will verbalize understanding of energy conservation strategies and recall 3 in use. Baseline: easily fatigued and decreased activity tolerance Goal status: re-educated this date (05/28/24)  2. Pt will verbalize understanding of memory compensation strategies and recall 3 in use Baseline: To be educated Goal status: NEW   LONG TERM GOAL: Target Date 08/26/24 1  Pt will report improved score on CISS to <21 for improved convergence. Baseline: 21/56 Goal status:INITIAL  2.  Pt will complete table top visual scanning activity at Mod I level with use of adaptive strategies PRN. Baseline: TBD Goal status: Not met at initial POC  3. Pt will complete simulated med mgmt task with no errors   Baseline: Pt completed with approximately 50% accuracy, reports she has pill organizer for just morning and night, whereas testing box had 4 individual boxes for each day.  Goal status: NEW   ASSESSMENT:  CLINICAL IMPRESSION: Patient is a 73 y.o. female who was  seen today for occupational therapy treatment for disconjugate gaze s/p CVA. Pt would benefit from continued skilled  services to improve ability to complete med mgmt and educate in techniques for memory compensation for improved safety and quality of life.  PERFORMANCE DEFICITS: in functional skills including ADLs, IADLs, ROM, balance, endurance, decreased knowledge of precautions, decreased knowledge of use of DME, and vision and psychosocial skills including coping strategies and environmental adaptation.     PLAN:  OT FREQUENCY: 1-2x/week  OT DURATION: 6 weeks  PLANNED INTERVENTIONS: 97168 OT Re-evaluation, 97535 self care/ADL training, 02889 therapeutic exercise, 97530 therapeutic activity, 97112 neuromuscular re-education, functional mobility training, visual/perceptual remediation/compensation, psychosocial skills training, energy conservation, coping strategies training, patient/family education, and DME and/or AE instructions  RECOMMENDED OTHER SERVICES: NA  CONSULTED AND AGREED WITH PLAN OF CARE: Patient and family member/caregiver  PLAN FOR NEXT SESSION: Check CISS Memory compensation education Continue education in techniques for improving med mgmt   Rocky Dutch, OTR/L 05/28/2024, 4:15 PM  [facility] [address], [address] [place], [place] [address] Phone # [phone] Fax # [phone]          "

## 2024-05-30 NOTE — Therapy (Signed)
 " OUTPATIENT PHYSICAL THERAPY NEURO NOTE   Patient Name: Vanessa Christensen MRN: [id number] DOB:[birth year], 73 y.o., female Today's Date: 06/02/2024   PCP:    Mahlon Comer BRAVO, MD   REFERRING PROVIDER: Raenelle Donalda HERO, MD     END OF SESSION:  PT End of Session - 06/02/24 1145     Visit Number 16    Number of Visits 20    Date for Recertification  06/11/24    Authorization Type Medicare/BCBS Federal    PT Start Time 1106    PT Stop Time 1147    PT Time Calculation (min) 41 min    Equipment Utilized During Treatment Gait belt    Activity Tolerance Patient tolerated treatment well    Behavior During Therapy WFL for tasks assessed/performed                      Past Medical History:  Diagnosis Date   Anemia    hx of  iron deficient   Anxiety    pt denies   Arthritis    hands and feet   Cancer (HCC) 10/04/2006   breast    left   Cataracts, both eyes    Depression    Depression    Diverticulosis of colon    GERD (gastroesophageal reflux disease)    Glaucoma    Hyperlipidemia    Hypertension    no meds   Joint pain    Knee pain    Obesity    OSA on CPAP    Osteoarthritis    Sleep apnea    no CPAP- no longer needed d/t weight loss   Thyroid  activity decreased    Past Surgical History:  Procedure Laterality Date   BREAST SURGERY Bilateral    CATARACT EXTRACTION     COLONOSCOPY     GLAUCOMA REPAIR     MASTECTOMY, RADICAL Bilateral    neulasta induced sterile abscesses     THYROIDECTOMY, PARTIAL     TONSILLECTOMY     TOTAL KNEE ARTHROPLASTY Right    TOTAL KNEE ARTHROPLASTY Left 10/27/2022   Procedure: LEFT TOTAL KNEE ARTHROPLASTY;  Surgeon: Vernetta Lonni GRADE, MD;  Location: [facility];  Service: Orthopedics;  Laterality: Left;   TOTAL KNEE REVISION Right 05/07/2020   Procedure: RIGHT TOTAL KNEE REVISION ARTHROPLASTY;  Surgeon: Vernetta Lonni GRADE, MD;  Location: [facility];  Service: Orthopedics;  Laterality: Right;   Patient Active  Problem List   Diagnosis Date Noted   CVA (cerebral vascular accident) (HCC) 03/14/2024   Cellulitis of right leg 12/21/2023   Onychomycosis 12/21/2023   Atherosclerosis of artery 03/12/2023   Adrenal nodule 11/16/2022   Thyroid  nodule 11/16/2022   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   Generalized obesity 10/05/2022   Memory loss 06/12/2022   Essential hypertension 04/13/2022   Obesity (BMI 30-39.9) 04/13/2022   Stress incontinence 11/08/2021   Status post revision of total replacement of right knee 05/07/2020   Failed total knee, right, 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/2020   History of total right knee replacement 01/28/2020   Vitamin D  deficiency 08/29/2018   Insulin  resistance 08/29/2018   Other specified glaucoma 08/14/2018   Incontinence of urine in female 11/07/2017   Vertigo 11/07/2017   Left knee pain 10/25/2016   Genetic testing 08/08/2016   Hypothyroidism 12/02/2015   Anxiety and depression 12/02/2015   OAB (overactive bladder) 12/02/2015   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/02/2015   BMI 37.0-37.9, adult  12/02/2015    ONSET DATE: 10/10/125  REFERRING DIAG: I63.9 (ICD-10-CM) - CVA (cerebral vascular accident) (HCC)  THERAPY DIAG:  Dizziness and giddiness  Unsteadiness on feet  Difficulty in walking, not elsewhere classified  Rationale for Evaluation and Treatment: Rehabilitation  SUBJECTIVE:                                                                                                                                                                                             SUBJECTIVE STATEMENT: Had a good holiday. Yesterday I was kind of dizzy in my head. Took my pills and laid down and that helped immensely.  Reports that dizziness overall since first onset is significantly better.     Pt accompanied by: family member-spouse  PERTINENT HISTORY: Anemia, anxiety, L breast CA s/p B mastectomy, depression,  HLD, HTN, B TKA  PAIN:  Are you having pain? No  PRECAUTIONS: Fall  RED FLAGS: None   WEIGHT BEARING RESTRICTIONS: No  FALLS: Has patient fallen in last 6 months? Yes. Number of falls 1  LIVING ENVIRONMENT: Lives with: lives with their spouse Lives in: House/apartment Stairs: 2 step to enter with handrail; 2 story home with bedroom on 1st floor Has following equipment at home: Single point cane and Walker - 2 wheeled  PLOF: Independent  PATIENT GOALS: improve dizziness  OBJECTIVE:      TODAY'S TREATMENT: 06/02/24 Activity Comments  Gait training with pt's walking poles ~332ft  With B, then single pole. Pt requires frequent verbal and occasional manual cues to maintain proper sequencing   review HEP: - Oncologist Feet Together: Eyes Open With Head Turns 30 - Standing with Head Nod  30 Mild sway evident with slow head movements   - Corner Balance Feet Together: Eyes Closed With Head Turns/nods   Unsteadiness and required CGA or UE support   - Standing Toe Taps  - Alternating Step Forward/back with Support    Cueing to occasionally look down to ensure safe foot placement   Gait training with cane, trying to achieve quicker pace  With and without cane; pt actually appeared safer walking quickly without AD. Pt still requires increased time when using cane        PATIENT EDUCATION: Education details: advised to practice walking with B poles down hallway to improve sequencing . Advised pt ok to try walking without cane in church as long as she is holding onto pews d/t good performance walking quickly without AD today  Person educated: Patient and Spouse Education method: Explanation Education comprehension: verbalized understanding    Access Code: [email or web address] URL: [email or web address] Date: 05/26/2024 Prepared by: [facility] -  Outpatient  Rehab - [facility]  Program Notes perform at counter top for safety  Exercises - Corner Balance Feet  Together: Eyes Open With Head Turns  - 1 x daily - 7 x weekly - 3 sets - 30 sec hold - Standing with Head Nod  - 1 x daily - 5 x weekly - 3 sets - 30 sec hold - Corner Balance Feet Together: Eyes Closed With Head Turns  - 1 x daily - 7 x weekly - 3 sets - 30 sec hold - Standing Toe Taps  - 1 x daily - 5 x weekly - 2 sets - 10 reps - Alternating Step Forward with Support  - 1 x daily - 5 x weekly - 2 sets - 10 reps - Backward Walking with Counter Support  - 1 x daily - 5 x weekly - 1 sets - 3-5 reps - Side Stepping with Counter Support  - 1 x daily - 5 x weekly - 1 sets - 3-5 reps         PATIENT EDUCATION: Education details:05/26/2024:  Updates to HEP Person educated: Patient, husband Education method: Explanation, Demonstration, Tactile cues, Verbal cues, and Handouts Education comprehension: verbalized understanding and returned demonstration          Note: Objective measures were completed at Evaluation unless otherwise noted.  DIAGNOSTIC FINDINGS: 03/14/24 brain MRI: Acute 6 mm right splenium lacunar infarct. No acute hemorrhage or mass effect. 2. Underlying signal changes of advanced chronic small vessel disease in the bilateral white matter and deep gray nuclei.  COGNITION: Overall cognitive status: Within functional limits for tasks assessed; husband assists with subjective report    SENSATION: Pt reports slight N/T in R foot d/t neuropathy   COORDINATION: Alternating pronation/supination: slight dysmetria on L Alternating toe tap: WNL Finger to nose: WNL  Vitals: 147/96 mmHg, 96% spO2, 80bpm     POSTURE: rounded shoulders and forward head  GAIT: Findings: Assistive device utilized:Single point cane, Level of assistance: Min A, and Comments: requires assistance for balance from husband   VESTIBULAR ASSESSMENT   GENERAL OBSERVATION: pt wears glasses but not sure what kind   OCULOMOTOR EXAM: Eye alignment: L eye elevated in orbit   Ocular ROM:  limited R eye abduction   Spontaneous Nystagmus: absent   Gaze-Induced Nystagmus: absent   Smooth Pursuits: difficulty tracking R and c/o diplopia at 45 degrees gaze to each side   Saccades: disconjugate eye movement with R eye overshooting/missing target repeatedly and c/o diplopia with R gaze   Convergence/Divergence: 12 inches c/o diplopia cm    VESTIBULAR - OCULAR REFLEX:    Slow VOR: Normal   VOR Cancellation: Unable to Maintain Gaze; c/o diplopia   Head-Impulse Test: HIT Right: negative HIT Left: negative    POSITIONAL TESTING:   Right Sidelying: strong anxiety response and c/o dizziness with nystagmus but unable to identify d/t pt closing eyes Left Sidelying: negative  TREATMENT DATE: 03/24/24    PATIENT EDUCATION: Education details: prognosis, POC, exam findings and need for MD f/u to address new oculomotor findings that were not documented in the hospital  Person educated: Patient and Spouse Education method: Explanation Education comprehension: verbalized understanding  HOME EXERCISE PROGRAM: Not yet initiated    GOALS: Goals reviewed with patient? Yes  SHORT TERM GOALS: Target date: 04/21/2024  Patient to be independent with initial HEP. Baseline: HEP initiated Goal status: MET    LONG TERM GOALS: Target date: 06/11/2024  Patient to be independent with advanced HEP. Baseline: Not yet initiated  Goal status: IN PROGRESS 05/07/24  Patient to report 0/10 dizziness with standing vertical and horizontal VOR for 30 seconds. Baseline: Unable; 1/10 dizziness horizontal 05/07/24 Goal status: IN PROGRESS 05/07/24  Patient will report 0/10 dizziness with bed mobility.  Baseline: Symptomatic; R roll: 2-3/10 dizziness Rolling supine 1-2/10 dizziness  R side > sit 3-4/10 dizziness 05/07/24  Goal status: IN PROGRESS 05/07/24  Patient to  demonstrate ambulation for 365ft with LRAD and supervision for safety.  Baseline: min A d/t imbalance; Completed 32ft in 3 minutes with supervision 05/07/24 Goal status: MET 05/07/24  Patient to score at least 15/24 on DGI in order to decrease risk of falls. Baseline: deferred d/t safety 04/10/24; 10/24 04/29/24 Goal status: IN PROGRESS 04/29/24  Patient to score at least 40/56 on Berg in order to decrease risk of falls. Baseline: 34/56 04/10/24; 45/56 05/07/24  Goal status: MET 05/07/24    ASSESSMENT:  CLINICAL IMPRESSION: Patient arrived to session with report of some dizziness yesterday which was improved with meds and rest. Session focused on gait training with patient's walking poles. Required cues to maintain sequencing and will need increased practice with this at home. Proceeded with HEP review which revealed improved weight shifting. Worked on trouble shooting pt's concern of not being able to walk quickly down the aisle at church for communion. She was able to demonstrate the safest quick walking pace without AD- advised ok to do this at church as she reports that she will be able to hold onto the pew. Patient tolerated session well and without complaints at end of appointment.  OBJECTIVE IMPAIRMENTS: Abnormal gait, decreased activity tolerance, decreased balance, decreased coordination, difficulty walking, and dizziness.   ACTIVITY LIMITATIONS: carrying, lifting, bending, sitting, standing, squatting, sleeping, stairs, transfers, bed mobility, bathing, toileting, dressing, reach over head, hygiene/grooming, locomotion level, and caring for others  PARTICIPATION LIMITATIONS: meal prep, cleaning, laundry, driving, shopping, community activity, yard work, and church  PERSONAL FACTORS: Age, Behavior pattern, Past/current experiences, Time since onset of injury/illness/exacerbation, and 3+ comorbidities: Anemia, anxiety, L breast CA s/p B mastectomy, depression, HLD, HTN, B TKA are also  affecting patient's functional outcome.   REHAB POTENTIAL: Good  CLINICAL DECISION MAKING: Evolving/moderate complexity  EVALUATION COMPLEXITY: Moderate  PLAN:  PT FREQUENCY: 1-2x/week  PT DURATION: other: 5 weeks   PLANNED INTERVENTIONS: [id number]- PT Re-evaluation, 97750- Physical Performance Testing, 97110-Therapeutic exercises, 97530- Therapeutic activity, [id number]- Neuromuscular re-education, 97535- Self Care, [id number]- Manual therapy, [phone]- Gait training, [phone]- Canalith repositioning, Patient/Family education, Balance training, Stair training, Taping, Vestibular training, Cryotherapy, and Moist heat  PLAN FOR NEXT SESSION:  Continue to work on habituating bed mobility, head nods: (Ask how this is going at home).  Check HEP additions.  Balance with narrow BOS, gait with increased foot clearance and weight shift. Progressing to obstacle negotiation, head motion with gait.   Louana Terrilyn Christians, PT, DPT 06/02/2024 11:50 AM  [facility] at [facility] [address], [address] [place], [place] [address] Phone # [phone] Fax # [phone]       "

## 2024-06-02 ENCOUNTER — Ambulatory Visit: Admitting: Physical Therapy

## 2024-06-02 ENCOUNTER — Encounter: Payer: Self-pay | Admitting: Physical Therapy

## 2024-06-02 ENCOUNTER — Ambulatory Visit: Admitting: Occupational Therapy

## 2024-06-02 DIAGNOSIS — R41842 Visuospatial deficit: Secondary | ICD-10-CM

## 2024-06-02 DIAGNOSIS — R262 Difficulty in walking, not elsewhere classified: Secondary | ICD-10-CM

## 2024-06-02 DIAGNOSIS — R42 Dizziness and giddiness: Secondary | ICD-10-CM | POA: Diagnosis not present

## 2024-06-02 DIAGNOSIS — R2681 Unsteadiness on feet: Secondary | ICD-10-CM

## 2024-06-02 DIAGNOSIS — R278 Other lack of coordination: Secondary | ICD-10-CM

## 2024-06-02 DIAGNOSIS — R29818 Other symptoms and signs involving the nervous system: Secondary | ICD-10-CM

## 2024-06-02 NOTE — Therapy (Signed)
 " OUTPATIENT OCCUPATIONAL THERAPY NEURO  Treatment Note   Patient Name: Vanessa Christensen MRN: [id number] DOB:August 05, 1950, 73 y.o., female Today's Date: 06/02/2024  PCP: Mahlon Comer BRAVO, MD REFERRING PROVIDER:   Douglass Kenney NOVAK, FNP    END OF SESSION:  OT End of Session - 06/02/24 1021     Visit Number 10    Number of Visits 22    Date for Recertification  08/26/24    Authorization Type Medicare Part A&B / BCBS Fed 2025    OT Start Time 1018    OT Stop Time 1100    OT Time Calculation (min) 42 min    Activity Tolerance Patient tolerated treatment well    Behavior During Therapy Anxious;WFL for tasks assessed/performed                  Past Medical History:  Diagnosis Date   Anemia    hx of  iron deficient   Anxiety    pt denies   Arthritis    hands and feet   Cancer (HCC) 10/04/2006   breast    left   Cataracts, both eyes    Depression    Depression    Diverticulosis of colon    GERD (gastroesophageal reflux disease)    Glaucoma    Hyperlipidemia    Hypertension    no meds   Joint pain    Knee pain    Obesity    OSA on CPAP    Osteoarthritis    Sleep apnea    no CPAP- no longer needed d/t weight loss   Thyroid  activity decreased    Past Surgical History:  Procedure Laterality Date   BREAST SURGERY Bilateral    CATARACT EXTRACTION     COLONOSCOPY     GLAUCOMA REPAIR     MASTECTOMY, RADICAL Bilateral    neulasta induced sterile abscesses     THYROIDECTOMY, PARTIAL     TONSILLECTOMY     TOTAL KNEE ARTHROPLASTY Right    TOTAL KNEE ARTHROPLASTY Left 10/27/2022   Procedure: LEFT TOTAL KNEE ARTHROPLASTY;  Surgeon: Vernetta Lonni GRADE, MD;  Location: [facility];  Service: Orthopedics;  Laterality: Left;   TOTAL KNEE REVISION Right 05/07/2020   Procedure: RIGHT TOTAL KNEE REVISION ARTHROPLASTY;  Surgeon: Vernetta Lonni GRADE, MD;  Location: [facility];  Service: Orthopedics;  Laterality: Right;   Patient Active Problem List   Diagnosis Date Noted    CVA (cerebral vascular accident) (HCC) 03/14/2024   Cellulitis of right leg 12/21/2023   Onychomycosis 12/21/2023   Atherosclerosis of artery 03/12/2023   Adrenal nodule 11/16/2022   Thyroid  nodule 11/16/2022   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   Generalized obesity 10/05/2022   Memory loss 06/12/2022   Essential hypertension 04/13/2022   Obesity (BMI 30-39.9) 04/13/2022   Stress incontinence 11/08/2021   Status post revision of total replacement of right knee 05/07/2020   Failed total knee, right, 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/2020   History of total right knee replacement 01/28/2020   Vitamin D  deficiency 08/29/2018   Insulin  resistance 08/29/2018   Other specified glaucoma 08/14/2018   Incontinence of urine in female 11/07/2017   Vertigo 11/07/2017   Left knee pain 10/25/2016   Genetic testing 08/08/2016   Hypothyroidism 12/02/2015   Anxiety and depression 12/02/2015   OAB (overactive bladder) 12/02/2015   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   Pt accompanied by: self and significant other  PERTINENT HISTORY: 73 y.o. female with medical history significant of hypertension, hyperlipidemia who presented to the ED due to dizziness.  Patient had been dizzy for 24 hours which has been progressively worsening and with vomiting.  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  On PT eval pt with disconjugate gaze with R not tracking with L eye.  PMH/o  Anemia, anxiety, L breast CA s/p B mastectomy, depression, HLD, HTN, B TKA   PRECAUTIONS: Fall  WEIGHT BEARING RESTRICTIONS: No  PAIN:  Are you having pain? No  FALLS: Has patient fallen in last 6 months? Yes. Number of falls 2 - one in the backyard and one entering restaurant   LIVING ENVIRONMENT: Lives with: lives with their spouse Lives in: House/apartment Stairs: 2 step to enter with handrail; 2 story home with bedroom on 1st floor Has following equipment at home: Single point cane and Walker - 2 wheeled, built in seat in shower that she does not use, grab bar next to the toilet, in plans for remodel in bathroom to elevate toilet seat  PLOF: Independent and Independent with basic ADLs; would ask for assistance with balance when navigating curbs in the community and if dropping item  PATIENT GOALS: to walk without tripping  OBJECTIVE:  Note: Objective measures were completed at Evaluation unless otherwise noted.  HAND DOMINANCE: Right  ADLs: Transfers/ambulation related to ADLs: Mod I, will occasionally use walker when vertigo is really bad Grooming: Mod I UB Dressing: Mod I LB Dressing: husband assisting with donning socks s/p knee surgery Toileting: Mod I Bathing: Mod I Tub Shower transfers: Mod I   IADLs:  Pt is doing majority of the cooking and will do some household tasks but feels that she doesn't have the energy.  Pt reports that the lighting in stores will effect her.   MOBILITY STATUS: Hx of falls and occasional hand held assist especially when in the community, has used RW when experiencing more vertigo symptoms  POSTURE COMMENTS:  rounded shoulders and forward head   ACTIVITY TOLERANCE: Activity tolerance: decreased endurance s/p CVA  FUNCTIONAL OUTCOME MEASURES: PSFS   UPPER EXTREMITY ROM:  WFL bilaterally   UPPER EXTREMITY MMT:   Grossly 4+ to 5 overall   COORDINATION: 9 Hole Peg test: Right: 29.63 sec; Left: 33.28 sec  SENSATION: Pt  reports slight N/T in R foot d/t neuropathy   COGNITION: Overall cognitive status: Within functional limits for tasks assessed  VISION: Subjective report: wears glasses 80% of the time Baseline vision: Wears glasses all the time Visual history: cataracts removal  VISION ASSESSMENT: Tracking/Visual pursuits: Right eye does not track laterally and Decreased smoothness with horizontal tracking; Diplopia with vertical tracking upwards Convergence: Impaired: reporting diplopia 6-7 from nose Visual Fields: no apparent deficits  Completed Convergence Insufficiency Symptom Survey (CISS): 21/56 21 or higher is suggestive of convergence insufficiency  Patient has difficulty with following activities due to following visual impairments: reading, tying shoes  PERCEPTION: WFL  PRAXIS: WFL  TREATMENT DATE:  06/02/24 CISS: Score 22.  Pt reporting losing concentration fairly often when reading, trouble remember what she has read, and feels like she reads slowly. Clock yourself: engaged in visual scanning in standing while tapping called numbers with R and L hand.  Pt able to complete 50 numbers per minute for 2:30 mins with alternating UE.  OT increased challenge to tapping even numbers with R hand and odd numbers with L hand, demonstrating increased challenge when scanning and alternating hands, reporting mild dizziness afterwards.   OT decreased pacing to 40 numbers per minute with pt completing only 1 error but reporting increased dizziness due to scanning.   Educated on paced breathing and focus when feeling dizziness coming on.  Pt reporting dizziness subsiding after a few mins of paced breathing. Visual scanning/attention: engaged in large peg board pattern replication with use of key to identify correlating colors, challenging alternating attention, and visual perception.  Pt initially organizing pegs by color to increase sequencing and utilized bookmark to keep concentration.  Educated on functional carryover of task implications with systematic process to minimize onset of errors.    87/75/74 Re-cert completed this date, as pt reports difficulty with med mgmt and memory. Tested pt ability to complete simulated med mgmt task, 50% accuracy approximately. Pt reports she has pill box at home with just morning and night, whereas this one had 4 for each day.  05/26/24 Medication management: engaged in problem solving supports to aid in recall of medications and routine to increase correct taking of medications.  Pt reporting difficulty remembering if she took antibiotic.  OT educating on use of grid for checks and balances to check off after taking, educated on PillMap or other checklists to keep up with medication types and purposes and frequency.  Pt and spouse receptive to recommendations.   Sequencing: engaged in problem solving sequencing and organization of routine tasks as pt reporting decreased motivation and/or awareness of time.  Discussed sequencing of events to ensure arrival to appointments on time and completion of various tasks in timely manner.  Of note, pt with anxiety and reporting that she often finds herself worrying about things that may get her off focus or keep her from completing a task. Pt does have a prescription for anxiety but is not taking in routinely, encouraged pt to f/u with MD if questions about her current medication regimen to allow increased engagement in tasks she would have been completing previously.       PATIENT EDUCATION: Education details: medication management, sequencing/organization Person educated: Patient and Spouse Education method: Explanation and Handouts Education comprehension: verbalized understanding and needs further education  HOME EXERCISE PROGRAM: Access Code: [email or web address] URL:  [email or web address] Date: 04/29/2024 Prepared by: [facility] - [facility]  Exercises - Seated Horizontal Smooth Pursuit  - 2 x daily - 10 reps - Seated Proximal-Distal Smooth Pursuit  - 2 x daily - 10 reps   GOALS: Goals reviewed with patient? Yes   SHORT TERM GOAL: Target Date 06/28/24 1  Pt will verbalize understanding of energy conservation strategies and recall 3 in use. Baseline: easily fatigued and decreased activity tolerance Goal status: re-educated this date (05/28/24)  2. Pt will verbalize understanding of memory compensation strategies and recall 3 in use Baseline: To be educated Goal status: NEW   LONG TERM GOAL: Target Date 08/26/24 1  Pt will report improved score on CISS to <21 for improved convergence. Baseline: 21/56 Goal status:INITIAL  2.  Pt  will complete table top visual scanning activity at Mod I level with use of adaptive strategies PRN. Baseline: TBD Goal status: Not met at initial POC  3. Pt will complete simulated med mgmt task with no errors   Baseline: Pt completed with approximately 50% accuracy, reports she has pill organizer for just morning and night, whereas testing box had 4 individual boxes for each day.  Goal status: NEW   ASSESSMENT:  CLINICAL IMPRESSION: Patient is a 73 y.o. female who was seen today for occupational therapy treatment for disconjugate gaze s/p CVA. Pt reporting mild dizziness with dynamic balance and visual scanning activity of Clock yourself.  Pt verbalizing good understanding of strategies to reduce dizzy symptoms.  Pt utilizing line follow for visual scanning with improved sequencing and reduction in errors.  Pt would benefit from continued skilled services to improve ability to complete med mgmt and educate in techniques for memory compensation for improved safety and quality of life.  PERFORMANCE DEFICITS: in functional skills including ADLs, IADLs, ROM, balance, endurance,  decreased knowledge of precautions, decreased knowledge of use of DME, and vision and psychosocial skills including coping strategies and environmental adaptation.     PLAN:  OT FREQUENCY: 1-2x/week  OT DURATION: 6 weeks  PLANNED INTERVENTIONS: 97168 OT Re-evaluation, 97535 self care/ADL training, 02889 therapeutic exercise, 97530 therapeutic activity, 97112 neuromuscular re-education, functional mobility training, visual/perceptual remediation/compensation, psychosocial skills training, energy conservation, coping strategies training, patient/family education, and DME and/or AE instructions  RECOMMENDED OTHER SERVICES: NA  CONSULTED AND AGREED WITH PLAN OF CARE: Patient and family member/caregiver  PLAN FOR NEXT SESSION:  Memory compensation education Continue education in techniques for improving med mgmt   [place], [place], OTR/L 06/02/2024, 10:22 AM  [facility] at [address] [facility] [address], [address] [place], [place] [address] Phone # [phone] Fax # [phone]          "

## 2024-06-03 ENCOUNTER — Encounter: Payer: Self-pay | Admitting: Bariatrics

## 2024-06-03 ENCOUNTER — Ambulatory Visit (INDEPENDENT_AMBULATORY_CARE_PROVIDER_SITE_OTHER): Admitting: Bariatrics

## 2024-06-03 VITALS — BP 157/81 | HR 85 | Temp 98.0°F | Ht 62.0 in | Wt 196.0 lb

## 2024-06-03 DIAGNOSIS — Z6835 Body mass index (BMI) 35.0-35.9, adult: Secondary | ICD-10-CM | POA: Diagnosis not present

## 2024-06-03 DIAGNOSIS — R632 Polyphagia: Secondary | ICD-10-CM

## 2024-06-03 DIAGNOSIS — E669 Obesity, unspecified: Secondary | ICD-10-CM

## 2024-06-03 DIAGNOSIS — E66812 Obesity, class 2: Secondary | ICD-10-CM

## 2024-06-03 NOTE — Progress Notes (Signed)
 "                                                                                                             WEIGHT SUMMARY AND BIOMETRICS  Weight Lost Since Last Visit: 0lb  Weight Gained Since Last Visit: 1lb   Vitals Temp: 98 F (36.7 C) BP: (!) 157/81 Pulse Rate: 85 SpO2: 97 %   Anthropometric Measurements Height: 5' 2 (1.575 m) Weight: 196 lb (88.9 kg) BMI (Calculated): 35.84 Weight at Last Visit: 195lb Weight Lost Since Last Visit: 0lb Weight Gained Since Last Visit: 1lb Starting Weight: 241lb Total Weight Loss (lbs): 45 lb (20.4 kg)   Body Composition  Body Fat %: 45.5 % Fat Mass (lbs): 89.2 lbs Muscle Mass (lbs): 101.6 lbs Total Body Water  (lbs): 73.2 lbs Visceral Fat Rating : 15   Other Clinical Data Fasting: No Labs: No Today's Visit #: 81 Starting Date: 08/13/18    OBESITY Vanessa Christensen is here to discuss her progress with her obesity treatment plan along with follow-up of her obesity related diagnoses.    Nutrition Plan: the Category 3 plan - 40% adherence.  Current exercise: none  Interim History:  Eating all of the food on the plan., Protein intake is as prescribed, Not journaling consistently., and Water  intake is adequate.   Pharmacotherapy: Vanessa Christensen had been on Wegovy .  Adverse side effects: depression and stomach pain Hunger is moderately controlled.  Cravings are moderately controlled.  Assessment/Plan:   Vanessa Christensen endorses excessive hunger. She wants to stop the Wegovy . She states that she is more depressed with the medications and stomach pains. She states that she feels like her appetite is average.  Medication(s): Wegovy  Effects of medication:  moderately controlled. Cravings are moderately controlled.   Plan: Medication(s): Will stop the Wegovy .  Will increase water , protein and fiber to help assuage hunger.  Will minimize foods that have a high glucose index/load to minimize reactive hypoglycemia.  We discussed a  diet recall. She will increase her protein.   Generalized Obesity: Current BMI BMI (Calculated): 35.84   Pharmacotherapy Plan Will stop the Wegovy  and not resume.  Vanessa Christensen is currently in the action stage of change. As such, her goal is to continue with weight loss efforts.  She has agreed to the Category 3 plan.  Exercise goals:She will continue her PT for her balance and walking.  She will continue with [employer] and will resume using the machines.   Behavioral modification strategies: increasing lean protein intake, decreasing simple carbohydrates , no meal skipping, decrease eating out, meal planning , increase water  intake, better snacking choices, planning for success, increasing vegetables, avoiding temptations, and keep healthy foods in the home.  Vanessa Christensen has agreed to follow-up with our clinic in 4 weeks.    Objective:   VITALS: Per patient if applicable, see vitals. GENERAL: Alert and in no acute distress. CARDIOPULMONARY: No increased WOB. Speaking in clear sentences.  PSYCH: Pleasant and cooperative. Speech normal rate and rhythm. Affect is appropriate. Insight and judgement are appropriate. Attention is focused, linear,  and appropriate.  NEURO: Oriented as arrived to appointment on time with no prompting.   Attestation Statements:   This was prepared with the assistance of Engineer, Civil (consulting).  Occasional wrong-word or sound-a-like substitutions may have occurred due to the inherent limitations of voice recognition .   Clayborne Daring, DO   "

## 2024-06-04 ENCOUNTER — Ambulatory Visit

## 2024-06-04 ENCOUNTER — Ambulatory Visit: Admitting: Occupational Therapy

## 2024-06-04 DIAGNOSIS — M6281 Muscle weakness (generalized): Secondary | ICD-10-CM

## 2024-06-04 DIAGNOSIS — R2681 Unsteadiness on feet: Secondary | ICD-10-CM

## 2024-06-04 DIAGNOSIS — R262 Difficulty in walking, not elsewhere classified: Secondary | ICD-10-CM

## 2024-06-04 DIAGNOSIS — R42 Dizziness and giddiness: Secondary | ICD-10-CM

## 2024-06-04 DIAGNOSIS — R278 Other lack of coordination: Secondary | ICD-10-CM

## 2024-06-04 DIAGNOSIS — R29818 Other symptoms and signs involving the nervous system: Secondary | ICD-10-CM

## 2024-06-04 DIAGNOSIS — R41842 Visuospatial deficit: Secondary | ICD-10-CM

## 2024-06-04 NOTE — Therapy (Signed)
 " OUTPATIENT PHYSICAL THERAPY NEURO NOTE   Patient Name: Vanessa Christensen MRN: [id number] DOB:11-12-1950, 73 y.o., female Today's Date: 06/04/2024   PCP:    Mahlon Comer BRAVO, MD   REFERRING PROVIDER: Raenelle Donalda HERO, MD     END OF SESSION:  PT End of Session - 06/04/24 1100     Visit Number 17    Number of Visits 20    Date for Recertification  06/11/24    Authorization Type Medicare/BCBS Federal    PT Start Time 1100    PT Stop Time 1145    PT Time Calculation (min) 45 min    Equipment Utilized During Treatment Gait belt    Activity Tolerance Patient tolerated treatment well    Behavior During Therapy WFL for tasks assessed/performed                      Past Medical History:  Diagnosis Date   Anemia    hx of  iron deficient   Anxiety    pt denies   Arthritis    hands and feet   Cancer (HCC) 10/04/2006   breast    left   Cataracts, both eyes    Depression    Depression    Diverticulosis of colon    GERD (gastroesophageal reflux disease)    Glaucoma    Hyperlipidemia    Hypertension    no meds   Joint pain    Knee pain    Obesity    OSA on CPAP    Osteoarthritis    Sleep apnea    no CPAP- no longer needed d/t weight loss   Thyroid  activity decreased    Past Surgical History:  Procedure Laterality Date   BREAST SURGERY Bilateral    CATARACT EXTRACTION     COLONOSCOPY     GLAUCOMA REPAIR     MASTECTOMY, RADICAL Bilateral    neulasta induced sterile abscesses     THYROIDECTOMY, PARTIAL     TONSILLECTOMY     TOTAL KNEE ARTHROPLASTY Right    TOTAL KNEE ARTHROPLASTY Left 10/27/2022   Procedure: LEFT TOTAL KNEE ARTHROPLASTY;  Surgeon: Vernetta Lonni GRADE, MD;  Location: [facility];  Service: Orthopedics;  Laterality: Left;   TOTAL KNEE REVISION Right 05/07/2020   Procedure: RIGHT TOTAL KNEE REVISION ARTHROPLASTY;  Surgeon: Vernetta Lonni GRADE, MD;  Location: [facility];  Service: Orthopedics;  Laterality: Right;   Patient Active  Problem List   Diagnosis Date Noted   CVA (cerebral vascular accident) (HCC) 03/14/2024   Cellulitis of right leg 12/21/2023   Onychomycosis 12/21/2023   Atherosclerosis of artery 03/12/2023   Adrenal nodule 11/16/2022   Thyroid  nodule 11/16/2022   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   Generalized obesity 10/05/2022   Memory loss 06/12/2022   Essential hypertension 04/13/2022   Obesity (BMI 30-39.9) 04/13/2022   Stress incontinence 11/08/2021   Status post revision of total replacement of right knee 05/07/2020   Failed total knee, right, 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/2020   History of total right knee replacement 01/28/2020   Vitamin D  deficiency 08/29/2018   Insulin  resistance 08/29/2018   Other specified glaucoma 08/14/2018   Incontinence of urine in female 11/07/2017   Vertigo 11/07/2017   Left knee pain 10/25/2016   Genetic testing 08/08/2016   Hypothyroidism 12/02/2015   Anxiety and depression 12/02/2015   OAB (overactive bladder) 12/02/2015   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/02/2015   BMI 37.0-37.9, adult  12/02/2015    ONSET DATE: 10/10/125  REFERRING DIAG: I63.9 (ICD-10-CM) - CVA (cerebral vascular accident) (HCC)  THERAPY DIAG:  Dizziness and giddiness  Difficulty in walking, not elsewhere classified  Unsteadiness on feet  Muscle weakness (generalized)  Rationale for Evaluation and Treatment: Rehabilitation  SUBJECTIVE:                                                                                                                                                                                             SUBJECTIVE STATEMENT: Doing pretty good today    Pt accompanied by: family member-spouse  PERTINENT HISTORY: Anemia, anxiety, L breast CA s/p B mastectomy, depression, HLD, HTN, B TKA  PAIN:  Are you having pain? No  PRECAUTIONS: Fall  RED FLAGS: None   WEIGHT BEARING RESTRICTIONS: No  FALLS:  Has patient fallen in last 6 months? Yes. Number of falls 1  LIVING ENVIRONMENT: Lives with: lives with their spouse Lives in: House/apartment Stairs: 2 step to enter with handrail; 2 story home with bedroom on 1st floor Has following equipment at home: Single point cane and Walker - 2 wheeled  PLOF: Independent  PATIENT GOALS: improve dizziness  OBJECTIVE:    TODAY'S TREATMENT: 06/04/24 Activity Comments  Dynamic standing Performing dual-tasking  Gait training -techniques to improve step height and negotiation of obstacles -training w/ trekking poles over uneven surfaces, stairs, level surfaces CGA-min A for uneven and stairs -training w/ trekking poles and techniuqes to encourage SLS with improved coordination/carryover but difficult on RLE  Static standing balance multisensory               TODAY'S TREATMENT: 06/02/24 Activity Comments  Gait training with pt's walking poles ~322ft  With B, then single pole. Pt requires frequent verbal and occasional manual cues to maintain proper sequencing   review HEP: - Oncologist Feet Together: Eyes Open With Head Turns 30 - Standing with Head Nod  30 Mild sway evident with slow head movements   - Corner Balance Feet Together: Eyes Closed With Head Turns/nods   Unsteadiness and required CGA or UE support   - Standing Toe Taps  - Alternating Step Forward/back with Support    Cueing to occasionally look down to ensure safe foot placement   Gait training with cane, trying to achieve quicker pace  With and without cane; pt actually appeared safer walking quickly without AD. Pt still requires increased time when using cane        PATIENT EDUCATION: Education details: advised to practice walking with B poles down hallway to improve sequencing . Advised pt ok to try walking without cane  in church as long as she is holding onto pews d/t good performance walking quickly without AD today  Person educated: Patient and Spouse Education  method: Explanation Education comprehension: verbalized understanding    Access Code: [email or web address] URL: [email or web address] Date: 05/26/2024 Prepared by: [facility] Outpatient  Rehab - [facility]  Program Notes perform at counter top for safety  Exercises - Corner Balance Feet Together: Eyes Open With Head Turns  - 1 x daily - 7 x weekly - 3 sets - 30 sec hold - Standing with Head Nod  - 1 x daily - 5 x weekly - 3 sets - 30 sec hold - Corner Balance Feet Together: Eyes Closed With Head Turns  - 1 x daily - 7 x weekly - 3 sets - 30 sec hold - Standing Toe Taps  - 1 x daily - 5 x weekly - 2 sets - 10 reps - Alternating Step Forward with Support  - 1 x daily - 5 x weekly - 2 sets - 10 reps - Backward Walking with Counter Support  - 1 x daily - 5 x weekly - 1 sets - 3-5 reps - Side Stepping with Counter Support  - 1 x daily - 5 x weekly - 1 sets - 3-5 reps         PATIENT EDUCATION: Education details:05/26/2024:  Updates to HEP Person educated: Patient, husband Education method: Explanation, Demonstration, Tactile cues, Verbal cues, and Handouts Education comprehension: verbalized understanding and returned demonstration          Note: Objective measures were completed at Evaluation unless otherwise noted.  DIAGNOSTIC FINDINGS: 03/14/24 brain MRI: Acute 6 mm right splenium lacunar infarct. No acute hemorrhage or mass effect. 2. Underlying signal changes of advanced chronic small vessel disease in the bilateral white matter and deep gray nuclei.  COGNITION: Overall cognitive status: Within functional limits for tasks assessed; husband assists with subjective report    SENSATION: Pt reports slight N/T in R foot d/t neuropathy   COORDINATION: Alternating pronation/supination: slight dysmetria on L Alternating toe tap: WNL Finger to nose: WNL  Vitals: 147/96 mmHg, 96% spO2, 80bpm     POSTURE: rounded shoulders and forward  head  GAIT: Findings: Assistive device utilized:Single point cane, Level of assistance: Min A, and Comments: requires assistance for balance from husband   VESTIBULAR ASSESSMENT   GENERAL OBSERVATION: pt wears glasses but not sure what kind   OCULOMOTOR EXAM: Eye alignment: L eye elevated in orbit   Ocular ROM: limited R eye abduction   Spontaneous Nystagmus: absent   Gaze-Induced Nystagmus: absent   Smooth Pursuits: difficulty tracking R and c/o diplopia at 45 degrees gaze to each side   Saccades: disconjugate eye movement with R eye overshooting/missing target repeatedly and c/o diplopia with R gaze   Convergence/Divergence: 12 inches c/o diplopia cm    VESTIBULAR - OCULAR REFLEX:    Slow VOR: Normal   VOR Cancellation: Unable to Maintain Gaze; c/o diplopia   Head-Impulse Test: HIT Right: negative HIT Left: negative    POSITIONAL TESTING:   Right Sidelying: strong anxiety response and c/o dizziness with nystagmus but unable to identify d/t pt closing eyes Left Sidelying: negative  TREATMENT DATE: 03/24/24    PATIENT EDUCATION: Education details: prognosis, POC, exam findings and need for MD f/u to address new oculomotor findings that were not documented in the hospital  Person educated: Patient and Spouse Education method: Explanation Education comprehension: verbalized understanding  HOME EXERCISE PROGRAM: Not yet initiated    GOALS: Goals reviewed with patient? Yes  SHORT TERM GOALS: Target date: 04/21/2024  Patient to be independent with initial HEP. Baseline: HEP initiated Goal status: MET    LONG TERM GOALS: Target date: 06/11/2024  Patient to be independent with advanced HEP. Baseline: Not yet initiated  Goal status: IN PROGRESS 05/07/24  Patient to report 0/10 dizziness with standing vertical and horizontal VOR for 30  seconds. Baseline: Unable; 1/10 dizziness horizontal 05/07/24 Goal status: IN PROGRESS 05/07/24  Patient will report 0/10 dizziness with bed mobility.  Baseline: Symptomatic; R roll: 2-3/10 dizziness Rolling supine 1-2/10 dizziness  R side > sit 3-4/10 dizziness 05/07/24  Goal status: IN PROGRESS 05/07/24  Patient to demonstrate ambulation for 33ft with LRAD and supervision for safety.  Baseline: min A d/t imbalance; Completed 342ft in 3 minutes with supervision 05/07/24 Goal status: MET 05/07/24  Patient to score at least 15/24 on DGI in order to decrease risk of falls. Baseline: deferred d/t safety 04/10/24; 10/24 04/29/24 Goal status: IN PROGRESS 04/29/24  Patient to score at least 40/56 on Berg in order to decrease risk of falls. Baseline: 34/56 04/10/24; 45/56 05/07/24  Goal status: MET 05/07/24    ASSESSMENT:  CLINICAL IMPRESSION: Neuro re-ed for dynamic and static balance to improve safety with unsupported standing, change of position, reaching outside BOS and negotiation of obstacles to improve safety with mobility requiring CGA for uneven surfaces and stairs initially with difficulty sequencing walking and stairs w/ trekking poles improved with coaching and teach-back with improve stride length evident but unsteady on uneven ground.  Cues for improving single leg stance with increased difficulty RLE improved with cues for glute contraction for stance control.  Demo mild sway with condition 4 M-CTSIB and reports practicing at home.    OBJECTIVE IMPAIRMENTS: Abnormal gait, decreased activity tolerance, decreased balance, decreased coordination, difficulty walking, and dizziness.   ACTIVITY LIMITATIONS: carrying, lifting, bending, sitting, standing, squatting, sleeping, stairs, transfers, bed mobility, bathing, toileting, dressing, reach over head, hygiene/grooming, locomotion level, and caring for others  PARTICIPATION LIMITATIONS: meal prep, cleaning, laundry, driving, shopping,  community activity, yard work, and church  PERSONAL FACTORS: Age, Behavior pattern, Past/current experiences, Time since onset of injury/illness/exacerbation, and 3+ comorbidities: Anemia, anxiety, L breast CA s/p B mastectomy, depression, HLD, HTN, B TKA are also affecting patient's functional outcome.   REHAB POTENTIAL: Good  CLINICAL DECISION MAKING: Evolving/moderate complexity  EVALUATION COMPLEXITY: Moderate  PLAN:  PT FREQUENCY: 1-2x/week  PT DURATION: other: 5 weeks   PLANNED INTERVENTIONS: [id number]- PT Re-evaluation, 97750- Physical Performance Testing, 97110-Therapeutic exercises, 97530- Therapeutic activity, [id number]- Neuromuscular re-education, 97535- Self Care, [id number]- Manual therapy, [phone]- Gait training, [phone]- Canalith repositioning, Patient/Family education, Balance training, Stair training, Taping, Vestibular training, Cryotherapy, and Moist heat  PLAN FOR NEXT SESSION:  Continue to work on habituating bed mobility, head nods: (Ask how this is going at home).  Check HEP additions.  Balance with narrow BOS, gait with increased foot clearance and weight shift. Progressing to obstacle negotiation, head motion with gait.   11:46 AM, 06/04/2024 M. Kelly Tyler Cubit, PT, DPT Physical Therapist- [place] Office Number: [phone]       "

## 2024-06-04 NOTE — Therapy (Signed)
 " OUTPATIENT OCCUPATIONAL THERAPY NEURO  Treatment Note   Patient Name: Vanessa Christensen MRN: [id number] DOB:[birth year], 73 y.o., female Today's Date: 06/04/2024  PCP: Mahlon Comer BRAVO, MD REFERRING PROVIDER:   Douglass Kenney NOVAK, FNP    END OF SESSION:  OT End of Session - 06/04/24 1204     Visit Number 11    Number of Visits 22    Date for Recertification  08/26/24    Authorization Type Medicare Part A&B / BCBS Fed 2025    OT Start Time 1015    OT Stop Time 1056    OT Time Calculation (min) 41 min    Activity Tolerance Patient tolerated treatment well    Behavior During Therapy WFL for tasks assessed/performed                   Past Medical History:  Diagnosis Date   Anemia    hx of  iron deficient   Anxiety    pt denies   Arthritis    hands and feet   Cancer (HCC) 10/04/2006   breast    left   Cataracts, both eyes    Depression    Depression    Diverticulosis of colon    GERD (gastroesophageal reflux disease)    Glaucoma    Hyperlipidemia    Hypertension    no meds   Joint pain    Knee pain    Obesity    OSA on CPAP    Osteoarthritis    Sleep apnea    no CPAP- no longer needed d/t weight loss   Thyroid  activity decreased    Past Surgical History:  Procedure Laterality Date   BREAST SURGERY Bilateral    CATARACT EXTRACTION     COLONOSCOPY     GLAUCOMA REPAIR     MASTECTOMY, RADICAL Bilateral    neulasta induced sterile abscesses     THYROIDECTOMY, PARTIAL     TONSILLECTOMY     TOTAL KNEE ARTHROPLASTY Right    TOTAL KNEE ARTHROPLASTY Left 10/27/2022   Procedure: LEFT TOTAL KNEE ARTHROPLASTY;  Surgeon: Vernetta Lonni GRADE, MD;  Location: [facility];  Service: Orthopedics;  Laterality: Left;   TOTAL KNEE REVISION Right 05/07/2020   Procedure: RIGHT TOTAL KNEE REVISION ARTHROPLASTY;  Surgeon: Vernetta Lonni GRADE, MD;  Location: [facility];  Service: Orthopedics;  Laterality: Right;   Patient Active Problem List   Diagnosis Date Noted   CVA  (cerebral vascular accident) (HCC) 03/14/2024   Cellulitis of right leg 12/21/2023   Onychomycosis 12/21/2023   Atherosclerosis of artery 03/12/2023   Adrenal nodule 11/16/2022   Thyroid  nodule 11/16/2022   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   Generalized obesity 10/05/2022   Memory loss 06/12/2022   Essential hypertension 04/13/2022   Obesity (BMI 30-39.9) 04/13/2022   Stress incontinence 11/08/2021   Status post revision of total replacement of right knee 05/07/2020   Failed total knee, right, 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/2020   History of total right knee replacement 01/28/2020   Vitamin D  deficiency 08/29/2018   Insulin  resistance 08/29/2018   Other specified glaucoma 08/14/2018   Incontinence of urine in female 11/07/2017   Vertigo 11/07/2017   Left knee pain 10/25/2016   Genetic testing 08/08/2016   Hypothyroidism 12/02/2015   Anxiety and depression 12/02/2015   OAB (overactive bladder) 12/02/2015   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/02/2015   BMI 37.0-37.9, adult 12/02/2015    ONSET DATE: referral 04/14/24  REFERRING  DIAG: H51.9 (ICD-10-CM) - Abnormal eye movements   THERAPY DIAG:  Visuospatial deficit  Other lack of coordination  Muscle weakness (generalized)  Other symptoms and signs involving the nervous system  Rationale for Evaluation and Treatment: Rehabilitation  SUBJECTIVE:   SUBJECTIVE STATEMENT: Pt reports her memory is her biggest concern at this time. Spouse in agreement.   Pt accompanied by: self and significant other  PERTINENT HISTORY: 73 y.o. female with medical history significant of hypertension, hyperlipidemia who presented to the ED due to dizziness.  Patient had been dizzy for 24 hours which has been progressively worsening and with vomiting.  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  On PT eval pt with disconjugate gaze with R not tracking  with L eye.  PMH/o Anemia, anxiety, L breast CA s/p B mastectomy, depression, HLD, HTN, B TKA   PRECAUTIONS: Fall  WEIGHT BEARING RESTRICTIONS: No  PAIN:  Are you having pain? No  FALLS: Has patient fallen in last 6 months? Yes. Number of falls 2 - one in the backyard and one entering restaurant   LIVING ENVIRONMENT: Lives with: lives with their spouse Lives in: House/apartment Stairs: 2 step to enter with handrail; 2 story home with bedroom on 1st floor Has following equipment at home: Single point cane and Walker - 2 wheeled, built in seat in shower that she does not use, grab bar next to the toilet, in plans for remodel in bathroom to elevate toilet seat  PLOF: Independent and Independent with basic ADLs; would ask for assistance with balance when navigating curbs in the community and if dropping item  PATIENT GOALS: to walk without tripping  OBJECTIVE:  Note: Objective measures were completed at Evaluation unless otherwise noted.  HAND DOMINANCE: Right  ADLs: Transfers/ambulation related to ADLs: Mod I, will occasionally use walker when vertigo is really bad Grooming: Mod I UB Dressing: Mod I LB Dressing: husband assisting with donning socks s/p knee surgery Toileting: Mod I Bathing: Mod I Tub Shower transfers: Mod I   IADLs:  Pt is doing majority of the cooking and will do some household tasks but feels that she doesn't have the energy.  Pt reports that the lighting in stores will effect her.   MOBILITY STATUS: Hx of falls and occasional hand held assist especially when in the community, has used RW when experiencing more vertigo symptoms  POSTURE COMMENTS:  rounded shoulders and forward head   ACTIVITY TOLERANCE: Activity tolerance: decreased endurance s/p CVA  FUNCTIONAL OUTCOME MEASURES: PSFS   UPPER EXTREMITY ROM:  WFL bilaterally   UPPER EXTREMITY MMT:   Grossly 4+ to 5 overall   COORDINATION: 9 Hole Peg test: Right: 29.63 sec; Left: 33.28  sec  SENSATION: Pt reports slight N/T in R foot d/t neuropathy   COGNITION: Overall cognitive status: Within functional limits for tasks assessed  VISION: Subjective report: wears glasses 80% of the time Baseline vision: Wears glasses all the time Visual history: cataracts removal  VISION ASSESSMENT: Tracking/Visual pursuits: Right eye does not track laterally and Decreased smoothness with horizontal tracking; Diplopia with vertical tracking upwards Convergence: Impaired: reporting diplopia 6-7 from nose Visual Fields: no apparent deficits  Completed Convergence Insufficiency Symptom Survey (CISS): 21/56 21 or higher is suggestive of convergence insufficiency  Patient has difficulty with following activities due to following visual impairments: reading, tying shoes  PERCEPTION: WFL  PRAXIS: WFL  TREATMENT DATE:  06/04/24 Initial focus of session discussion with pt and spouse regarding memory strategies such as using medication organizer, using the checklist previously educated in other sessions to ensure taking meds, keeping items in the same place, doing certain tasks on days to encourage routine and recall, etc. Seated cognitive task for card matching, encouraging recall for matching game needing extended time but able to complete the task. Encouarged to do at home as well. Dynamic standing and visual scanning at mirror for #1-15 with only 1 error noted. Pt having to erase in order with only 1 error. Writing task at end of session to recall events during session, pt able to recall 3/3 tasks with mildly extended time.  06/02/24 CISS: Score 22.  Pt reporting losing concentration fairly often when reading, trouble remember what she has read, and feels like she reads slowly. Clock yourself: engaged in visual scanning in standing while tapping called numbers  with R and L hand.  Pt able to complete 50 numbers per minute for 2:30 mins with alternating UE.  OT increased challenge to tapping even numbers with R hand and odd numbers with L hand, demonstrating increased challenge when scanning and alternating hands, reporting mild dizziness afterwards.   OT decreased pacing to 40 numbers per minute with pt completing only 1 error but reporting increased dizziness due to scanning.   Educated on paced breathing and focus when feeling dizziness coming on.  Pt reporting dizziness subsiding after a few mins of paced breathing. Visual scanning/attention: engaged in large peg board pattern replication with use of key to identify correlating colors, challenging alternating attention, and visual perception. Pt initially organizing pegs by color to increase sequencing and utilized bookmark to keep concentration.  Educated on functional carryover of task implications with systematic process to minimize onset of errors.   87/75/74 Re-cert completed this date, as pt reports difficulty with med mgmt and memory. Tested pt ability to complete simulated med mgmt task, 50% accuracy approximately. Pt reports she has pill box at home with just morning and night, whereas this one had 4 for each day.      PATIENT EDUCATION: Education details: medication management, sequencing/organization Person educated: Patient and Spouse Education method: Explanation and Handouts Education comprehension: verbalized understanding and needs further education  HOME EXERCISE PROGRAM: Access Code: [email or web address] URL: [email or web address] Date: 04/29/2024 Prepared by: [facility] - Outpatient  Rehab - [facility]  Exercises - Seated Horizontal Smooth Pursuit  - 2 x daily - 10 reps - Seated Proximal-Distal Smooth Pursuit  - 2 x daily - 10 reps   GOALS: Goals reviewed with patient? Yes   SHORT TERM GOAL: Target Date 06/28/24 1  Pt will verbalize understanding of energy  conservation strategies and recall 3 in use. Baseline: easily fatigued and decreased activity tolerance Goal status: re-educated this date (05/28/24)  2. Pt will verbalize understanding of memory compensation strategies and recall 3 in use Baseline: To be educated Goal status: NEW   LONG TERM GOAL: Target Date 08/26/24 1  Pt will report improved score on CISS to <21 for improved convergence. Baseline: 21/56 Goal status:INITIAL  2.  Pt will complete table top visual scanning activity at Mod I level with use of adaptive strategies PRN. Baseline: TBD Goal status: Not met at initial POC  3. Pt will complete simulated med mgmt task with no errors   Baseline: Pt completed with approximately 50% accuracy, reports she has pill organizer for just morning and night, whereas testing box had 4  individual boxes for each day.  Goal status: NEW   ASSESSMENT:  CLINICAL IMPRESSION: Patient is a 73 y.o. female who was seen today for occupational therapy treatment for disconjugate gaze s/p CVA and cognitive retraining. Pt and spouse verbalizing techniques and strategies to improve visual scanning and following along at home in a book, reading, etc. Pt is an active participant this date in cognitive tasks as well as visual scanning and tracking. Pt would benefit from continued skilled services to improve ability to complete med mgmt and educate in techniques for memory compensation for improved safety and quality of life.  PERFORMANCE DEFICITS: in functional skills including ADLs, IADLs, ROM, balance, endurance, decreased knowledge of precautions, decreased knowledge of use of DME, and vision and psychosocial skills including coping strategies and environmental adaptation.     PLAN:  OT FREQUENCY: 1-2x/week  OT DURATION: 6 weeks  PLANNED INTERVENTIONS: 97168 OT Re-evaluation, 97535 self care/ADL training, 02889 therapeutic exercise, 97530 therapeutic activity, 97112 neuromuscular re-education,  functional mobility training, visual/perceptual remediation/compensation, psychosocial skills training, energy conservation, coping strategies training, patient/family education, and DME and/or AE instructions  RECOMMENDED OTHER SERVICES: NA  CONSULTED AND AGREED WITH PLAN OF CARE: Patient and family member/caregiver  PLAN FOR NEXT SESSION:  Memory compensation education Continue education in techniques for improving med mgmt   Chiquita JAYSON Hopping, OTR/L 06/04/2024, 12:06 PM  [facility] at [facility] [address], [address] [place], [place] [address] Phone # [phone] Fax # [phone]          "

## 2024-06-09 ENCOUNTER — Ambulatory Visit: Attending: Internal Medicine

## 2024-06-09 ENCOUNTER — Other Ambulatory Visit (INDEPENDENT_AMBULATORY_CARE_PROVIDER_SITE_OTHER)

## 2024-06-09 DIAGNOSIS — D72821 Monocytosis (symptomatic): Secondary | ICD-10-CM | POA: Diagnosis not present

## 2024-06-09 DIAGNOSIS — R278 Other lack of coordination: Secondary | ICD-10-CM | POA: Insufficient documentation

## 2024-06-09 DIAGNOSIS — R2681 Unsteadiness on feet: Secondary | ICD-10-CM | POA: Insufficient documentation

## 2024-06-09 DIAGNOSIS — R262 Difficulty in walking, not elsewhere classified: Secondary | ICD-10-CM | POA: Insufficient documentation

## 2024-06-09 DIAGNOSIS — R41844 Frontal lobe and executive function deficit: Secondary | ICD-10-CM | POA: Diagnosis present

## 2024-06-09 DIAGNOSIS — R42 Dizziness and giddiness: Secondary | ICD-10-CM | POA: Diagnosis present

## 2024-06-09 DIAGNOSIS — R41842 Visuospatial deficit: Secondary | ICD-10-CM | POA: Insufficient documentation

## 2024-06-09 DIAGNOSIS — M6281 Muscle weakness (generalized): Secondary | ICD-10-CM | POA: Diagnosis present

## 2024-06-09 DIAGNOSIS — R29818 Other symptoms and signs involving the nervous system: Secondary | ICD-10-CM | POA: Insufficient documentation

## 2024-06-09 LAB — CBC WITH DIFFERENTIAL/PLATELET
Basophils Absolute: 0 K/uL (ref 0.0–0.1)
Basophils Relative: 0.8 % (ref 0.0–3.0)
Eosinophils Absolute: 0 K/uL (ref 0.0–0.7)
Eosinophils Relative: 0 % (ref 0.0–5.0)
HCT: 34.9 % — ABNORMAL LOW (ref 36.0–46.0)
Hemoglobin: 11.8 g/dL — ABNORMAL LOW (ref 12.0–15.0)
Lymphocytes Relative: 27.3 % (ref 12.0–46.0)
Lymphs Abs: 1.2 K/uL (ref 0.7–4.0)
MCHC: 33.8 g/dL (ref 30.0–36.0)
MCV: 85.5 fl (ref 78.0–100.0)
Monocytes Absolute: 1 K/uL (ref 0.1–1.0)
Monocytes Relative: 22.1 % — ABNORMAL HIGH (ref 3.0–12.0)
Neutro Abs: 2.2 K/uL (ref 1.4–7.7)
Neutrophils Relative %: 49.8 % (ref 43.0–77.0)
Platelets: 152 K/uL (ref 150.0–400.0)
RBC: 4.08 Mil/uL (ref 3.87–5.11)
RDW: 15.9 % — ABNORMAL HIGH (ref 11.5–15.5)
WBC: 4.4 K/uL (ref 4.0–10.5)

## 2024-06-09 NOTE — Therapy (Signed)
 " OUTPATIENT PHYSICAL THERAPY NEURO NOTE   Patient Name: Vanessa Christensen MRN: [id number] DOB:[birth year], 74 y.o., female Today's Date: 06/09/2024   PCP:    Mahlon Comer BRAVO, MD   REFERRING PROVIDER: Raenelle Donalda HERO, MD     END OF SESSION:  PT End of Session - 06/09/24 1101     Visit Number 18    Number of Visits 20    Date for Recertification  06/11/24    Authorization Type Medicare/BCBS Federal    PT Start Time 1100    PT Stop Time 1145    PT Time Calculation (min) 45 min    Equipment Utilized During Treatment Gait belt    Activity Tolerance Patient tolerated treatment well    Behavior During Therapy WFL for tasks assessed/performed                      Past Medical History:  Diagnosis Date   Anemia    hx of  iron deficient   Anxiety    pt denies   Arthritis    hands and feet   Cancer (HCC) 10/04/2006   breast    left   Cataracts, both eyes    Depression    Depression    Diverticulosis of colon    GERD (gastroesophageal reflux disease)    Glaucoma    Hyperlipidemia    Hypertension    no meds   Joint pain    Knee pain    Obesity    OSA on CPAP    Osteoarthritis    Sleep apnea    no CPAP- no longer needed d/t weight loss   Thyroid  activity decreased    Past Surgical History:  Procedure Laterality Date   BREAST SURGERY Bilateral    CATARACT EXTRACTION     COLONOSCOPY     GLAUCOMA REPAIR     MASTECTOMY, RADICAL Bilateral    neulasta induced sterile abscesses     THYROIDECTOMY, PARTIAL     TONSILLECTOMY     TOTAL KNEE ARTHROPLASTY Right    TOTAL KNEE ARTHROPLASTY Left 10/27/2022   Procedure: LEFT TOTAL KNEE ARTHROPLASTY;  Surgeon: Vernetta Lonni GRADE, MD;  Location: [facility];  Service: Orthopedics;  Laterality: Left;   TOTAL KNEE REVISION Right 05/07/2020   Procedure: RIGHT TOTAL KNEE REVISION ARTHROPLASTY;  Surgeon: Vernetta Lonni GRADE, MD;  Location: [facility];  Service: Orthopedics;  Laterality: Right;   Patient Active  Problem List   Diagnosis Date Noted   CVA (cerebral vascular accident) (HCC) 03/14/2024   Cellulitis of right leg 12/21/2023   Onychomycosis 12/21/2023   Atherosclerosis of artery 03/12/2023   Adrenal nodule 11/16/2022   Thyroid  nodule 11/16/2022   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   Generalized obesity 10/05/2022   Memory loss 06/12/2022   Essential hypertension 04/13/2022   Obesity (BMI 30-39.9) 04/13/2022   Stress incontinence 11/08/2021   Status post revision of total replacement of right knee 05/07/2020   Failed total knee, right, 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/2020   History of total right knee replacement 01/28/2020   Vitamin D  deficiency 08/29/2018   Insulin  resistance 08/29/2018   Other specified glaucoma 08/14/2018   Incontinence of urine in female 11/07/2017   Vertigo 11/07/2017   Left knee pain 10/25/2016   Genetic testing 08/08/2016   Hypothyroidism 12/02/2015   Anxiety and depression 12/02/2015   OAB (overactive bladder) 12/02/2015   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/02/2015   BMI 37.0-37.9, adult  12/02/2015    ONSET DATE: 10/10/125  REFERRING DIAG: I63.9 (ICD-10-CM) - CVA (cerebral vascular accident) (HCC)  THERAPY DIAG:  Dizziness and giddiness  Difficulty in walking, not elsewhere classified  Unsteadiness on feet  Muscle weakness (generalized)  Rationale for Evaluation and Treatment: Rehabilitation  SUBJECTIVE:                                                                                                                                                                                             SUBJECTIVE STATEMENT: Feeling pretty good, no dizziness to speak of    Pt accompanied by: family member-spouse  PERTINENT HISTORY: Anemia, anxiety, L breast CA s/p B mastectomy, depression, HLD, HTN, B TKA  PAIN:  Are you having pain? No  PRECAUTIONS: Fall  RED FLAGS: None   WEIGHT BEARING  RESTRICTIONS: No  FALLS: Has patient fallen in last 6 months? Yes. Number of falls 1  LIVING ENVIRONMENT: Lives with: lives with their spouse Lives in: House/apartment Stairs: 2 step to enter with handrail; 2 story home with bedroom on 1st floor Has following equipment at home: Single point cane and Walker - 2 wheeled  PLOF: Independent  PATIENT GOALS: improve dizziness  OBJECTIVE:   TODAY'S TREATMENT: 06/10/23 Activity Comments  Bed mobility Sit to supine Rolling left-right Sit to supine: 2/10 lightheaded--brief  VOR Seated: no symptoms Standing: x 30 sec -horizontal: no symptoms -vertical: no symptoms  Walking VOR Difficulty with sequencing, improved with support on counter  Single leg support -lift, hover, drop on 4 step 1x10 BUE/no UE support -alt toe taps standing on foam 2x60 sec (UE to no UE support)  Static multisensory balance   Gait training Trials with trekking poles and curb negotiation CGA-min A      TODAY'S TREATMENT: 06/04/24 Activity Comments  Dynamic standing Performing dual-tasking  Gait training -techniques to improve step height and negotiation of obstacles -training w/ trekking poles over uneven surfaces, stairs, level surfaces CGA-min A for uneven and stairs -training w/ trekking poles and techniuqes to encourage SLS with improved coordination/carryover but difficult on RLE  Static standing balance multisensory                  PATIENT EDUCATION: Education details: advised to practice walking with B poles down hallway to improve sequencing . Advised pt ok to try walking without cane in church as long as she is holding onto pews d/t good performance walking quickly without AD today  Person educated: Patient and Spouse Education method: Explanation Education comprehension: verbalized understanding    Access Code: [email or web address] URL: [email or web address] Date: 05/26/2024 Prepared by: [facility] - Outpatient  Re[facility] - [facility]  Program Notes perform at counter top for safety  Exercises - Corner Balance Feet Together: Eyes Open With Head Turns  - 1 x daily - 7 x weekly - 3 sets - 30 sec hold - Standing with Head Nod  - 1 x daily - 5 x weekly - 3 sets - 30 sec hold - Corner Balance Feet Together: Eyes Closed With Head Turns  - 1 x daily - 7 x weekly - 3 sets - 30 sec hold - Standing Toe Taps  - 1 x daily - 5 x weekly - 2 sets - 10 reps - Alternating Step Forward with Support  - 1 x daily - 5 x weekly - 2 sets - 10 reps - Backward Walking with Counter Support  - 1 x daily - 5 x weekly - 1 sets - 3-5 reps - Side Stepping with Counter Support  - 1 x daily - 5 x weekly - 1 sets - 3-5 reps - Walking Gaze Stabilization Head Rotation  - 3 sets - 15-30 sec hold        PATIENT EDUCATION: Education details:05/26/2024:  Updates to HEP Person educated: Patient, husband Education method: Explanation, Demonstration, Tactile cues, Verbal cues, and Handouts Education comprehension: verbalized understanding and returned demonstration          Note: Objective measures were completed at Evaluation unless otherwise noted.  DIAGNOSTIC FINDINGS: 03/14/24 brain MRI: Acute 6 mm right splenium lacunar infarct. No acute hemorrhage or mass effect. 2. Underlying signal changes of advanced chronic small vessel disease in the bilateral white matter and deep gray nuclei.  COGNITION: Overall cognitive status: Within functional limits for tasks assessed; husband assists with subjective report    SENSATION: Pt reports slight N/T in R foot d/t neuropathy   COORDINATION: Alternating pronation/supination: slight dysmetria on L Alternating toe tap: WNL Finger to nose: WNL  Vitals: 147/96 mmHg, 96% spO2, 80bpm     POSTURE: rounded shoulders and forward head  GAIT: Findings: Assistive device utilized:Single point cane, Level of assistance: Min A, and Comments: requires assistance for balance from  husband   VESTIBULAR ASSESSMENT   GENERAL OBSERVATION: pt wears glasses but not sure what kind   OCULOMOTOR EXAM: Eye alignment: L eye elevated in orbit   Ocular ROM: limited R eye abduction   Spontaneous Nystagmus: absent   Gaze-Induced Nystagmus: absent   Smooth Pursuits: difficulty tracking R and c/o diplopia at 45 degrees gaze to each side   Saccades: disconjugate eye movement with R eye overshooting/missing target repeatedly and c/o diplopia with R gaze   Convergence/Divergence: 12 inches c/o diplopia cm    VESTIBULAR - OCULAR REFLEX:    Slow VOR: Normal   VOR Cancellation: Unable to Maintain Gaze; c/o diplopia   Head-Impulse Test: HIT Right: negative HIT Left: negative    POSITIONAL TESTING:   Right Sidelying: strong anxiety response and c/o dizziness with nystagmus but unable to identify d/t pt closing eyes Left Sidelying: negative  TREATMENT DATE: 03/24/24    PATIENT EDUCATION: Education details: prognosis, POC, exam findings and need for MD f/u to address new oculomotor findings that were not documented in the hospital  Person educated: Patient and Spouse Education method: Explanation Education comprehension: verbalized understanding  HOME EXERCISE PROGRAM: Not yet initiated    GOALS: Goals reviewed with patient? Yes  SHORT TERM GOALS: Target date: 04/21/2024  Patient to be independent with initial HEP. Baseline: HEP initiated Goal status: MET    LONG TERM GOALS: Target date: 06/11/2024  Patient to be independent with advanced HEP. Baseline: Not yet initiated  Goal status: IN PROGRESS 05/07/24  Patient to report 0/10 dizziness with standing vertical and horizontal VOR for 30 seconds. Baseline: Unable; 1/10 dizziness horizontal; asymptomatic Goal status: MET  Patient will report 0/10 dizziness with bed mobility.  Baseline:  Symptomatic; R roll: 2-3/10 dizziness Rolling supine 1-2/10 dizziness  R side > sit 3-4/10 dizziness 05/07/24  Goal status: MET   Patient to demonstrate ambulation for 384ft with LRAD and supervision for safety.  Baseline: min A d/t imbalance; Completed 362ft in 3 minutes with supervision 05/07/24 Goal status: MET 05/07/24  Patient to score at least 15/24 on DGI in order to decrease risk of falls. Baseline: deferred d/t safety 04/10/24; 10/24 04/29/24 Goal status: IN PROGRESS 04/29/24  Patient to score at least 40/56 on Berg in order to decrease risk of falls. Baseline: 34/56 04/10/24; 45/56 05/07/24  Goal status: MET 05/07/24    ASSESSMENT:  CLINICAL IMPRESSION: Reports improved dizziness symptoms and currently asymptomatic to bed mobility, seated, and standing VOR.  Instructed in walking with VOR with report of dizziness to horizontal vs vertical and difficulty with coordinating with improved performance after rehearsal and use of light UE support on rail/wall in order to progress for HEP performance.  Balance activities to facilitate postural control, single limb support, and proprioceptive awareness.  Reports difficulty with stair/curb negotition and practice with trekking poles for ascending/descending various step heights requiring CGA-min A for control/support improved to supervision by end of session. Continued sessions to advance POC details to improve mobility, reduce symptoms, and reduce risk for falls  OBJECTIVE IMPAIRMENTS: Abnormal gait, decreased activity tolerance, decreased balance, decreased coordination, difficulty walking, and dizziness.   ACTIVITY LIMITATIONS: carrying, lifting, bending, sitting, standing, squatting, sleeping, stairs, transfers, bed mobility, bathing, toileting, dressing, reach over head, hygiene/grooming, locomotion level, and caring for others  PARTICIPATION LIMITATIONS: meal prep, cleaning, laundry, driving, shopping, community activity, yard work, and  church  PERSONAL FACTORS: Age, Behavior pattern, Past/current experiences, Time since onset of injury/illness/exacerbation, and 3+ comorbidities: Anemia, anxiety, L breast CA s/p B mastectomy, depression, HLD, HTN, B TKA are also affecting patient's functional outcome.   REHAB POTENTIAL: Good  CLINICAL DECISION MAKING: Evolving/moderate complexity  EVALUATION COMPLEXITY: Moderate  PLAN:  PT FREQUENCY: 1-2x/week  PT DURATION: other: 5 weeks   PLANNED INTERVENTIONS: [id number]- PT Re-evaluation, 97750- Physical Performance Testing, 97110-Therapeutic exercises, 97530- Therapeutic activity, [id number]- Neuromuscular re-education, 97535- Self Care, [id number]- Manual therapy, [phone]- Gait training, [phone]- Canalith repositioning, Patient/Family education, Balance training, Stair training, Taping, Vestibular training, Cryotherapy, and Moist heat  PLAN FOR NEXT SESSION:  Continue to work on habituating bed mobility, head nods: (Ask how this is going at home).  Check HEP additions.  Balance with narrow BOS, gait with increased foot clearance and weight shift. Progressing to obstacle negotiation, head motion with gait.   11:01 AM, 06/09/2024 M. Kelly Finlay Godbee, PT, DPT Physical Therapist- [place] Office Number: [phone]       "

## 2024-06-11 ENCOUNTER — Ambulatory Visit

## 2024-06-11 ENCOUNTER — Ambulatory Visit: Admitting: Occupational Therapy

## 2024-06-11 DIAGNOSIS — M6281 Muscle weakness (generalized): Secondary | ICD-10-CM

## 2024-06-11 DIAGNOSIS — R29818 Other symptoms and signs involving the nervous system: Secondary | ICD-10-CM

## 2024-06-11 DIAGNOSIS — R41842 Visuospatial deficit: Secondary | ICD-10-CM

## 2024-06-11 DIAGNOSIS — R42 Dizziness and giddiness: Secondary | ICD-10-CM | POA: Diagnosis not present

## 2024-06-11 DIAGNOSIS — R2681 Unsteadiness on feet: Secondary | ICD-10-CM

## 2024-06-11 DIAGNOSIS — R262 Difficulty in walking, not elsewhere classified: Secondary | ICD-10-CM

## 2024-06-11 NOTE — Therapy (Signed)
 " OUTPATIENT OCCUPATIONAL THERAPY NEURO  Treatment Note   Patient Name: Vanessa Christensen MRN: [id number] DOB:07-22-1950, 74 y.o., female Today's Date: 06/11/2024  PCP: Mahlon Comer BRAVO, MD REFERRING PROVIDER:   Douglass Kenney NOVAK, FNP    END OF SESSION:  OT End of Session - 06/11/24 1155     Visit Number 12    Number of Visits 22    Date for Recertification  08/26/24    Authorization Type Medicare Part A&B / BCBS Fed 2025    OT Start Time 1151    OT Stop Time 1234    OT Time Calculation (min) 43 min    Activity Tolerance Patient tolerated treatment well    Behavior During Therapy WFL for tasks assessed/performed                    Past Medical History:  Diagnosis Date   Anemia    hx of  iron deficient   Anxiety    pt denies   Arthritis    hands and feet   Cancer (HCC) 10/04/2006   breast    left   Cataracts, both eyes    Depression    Depression    Diverticulosis of colon    GERD (gastroesophageal reflux disease)    Glaucoma    Hyperlipidemia    Hypertension    no meds   Joint pain    Knee pain    Obesity    OSA on CPAP    Osteoarthritis    Sleep apnea    no CPAP- no longer needed d/t weight loss   Thyroid  activity decreased    Past Surgical History:  Procedure Laterality Date   BREAST SURGERY Bilateral    CATARACT EXTRACTION     COLONOSCOPY     GLAUCOMA REPAIR     MASTECTOMY, RADICAL Bilateral    neulasta induced sterile abscesses     THYROIDECTOMY, PARTIAL     TONSILLECTOMY     TOTAL KNEE ARTHROPLASTY Right    TOTAL KNEE ARTHROPLASTY Left 10/27/2022   Procedure: LEFT TOTAL KNEE ARTHROPLASTY;  Surgeon: Vernetta Lonni GRADE, MD;  Location: [facility];  Service: Orthopedics;  Laterality: Left;   TOTAL KNEE REVISION Right 05/07/2020   Procedure: RIGHT TOTAL KNEE REVISION ARTHROPLASTY;  Surgeon: Vernetta Lonni GRADE, MD;  Location: [facility];  Service: Orthopedics;  Laterality: Right;   Patient Active Problem List   Diagnosis Date Noted   CVA  (cerebral vascular accident) (HCC) 03/14/2024   Cellulitis of right leg 12/21/2023   Onychomycosis 12/21/2023   Atherosclerosis of artery 03/12/2023   Adrenal nodule 11/16/2022   Thyroid  nodule 11/16/2022   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   Generalized obesity 10/05/2022   Memory loss 06/12/2022   Essential hypertension 04/13/2022   Obesity (BMI 30-39.9) 04/13/2022   Stress incontinence 11/08/2021   Status post revision of total replacement of right knee 05/07/2020   Failed total knee, right, 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/2020   History of total right knee replacement 01/28/2020   Vitamin D  deficiency 08/29/2018   Insulin  resistance 08/29/2018   Other specified glaucoma 08/14/2018   Incontinence of urine in female 11/07/2017   Vertigo 11/07/2017   Left knee pain 10/25/2016   Genetic testing 08/08/2016   Hypothyroidism 12/02/2015   Anxiety and depression 12/02/2015   OAB (overactive bladder) 12/02/2015   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/02/2015   BMI 37.0-37.9, adult 12/02/2015    ONSET DATE: referral 04/14/24  REFERRING DIAG: H51.9 (ICD-10-CM) - Abnormal eye movements   THERAPY DIAG:  Visuospatial deficit  Other symptoms and signs involving the nervous system  Rationale for Evaluation and Treatment: Rehabilitation  SUBJECTIVE:   SUBJECTIVE STATEMENT: Pt's spouse reports that he made a pill map for her but have not yet put it into practice.  Pt accompanied by: self and significant other  PERTINENT HISTORY: 74 y.o. female with medical history significant of hypertension, hyperlipidemia who presented to the ED due to dizziness.  Patient had been dizzy for 24 hours which has been progressively worsening and with vomiting.  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  On PT eval pt with disconjugate gaze with R not tracking with L eye.  PMH/o Anemia, anxiety, L breast CA s/p  B mastectomy, depression, HLD, HTN, B TKA   PRECAUTIONS: Fall  WEIGHT BEARING RESTRICTIONS: No  PAIN:  Are you having pain? No  FALLS: Has patient fallen in last 6 months? Yes. Number of falls 2 - one in the backyard and one entering restaurant   LIVING ENVIRONMENT: Lives with: lives with their spouse Lives in: House/apartment Stairs: 2 step to enter with handrail; 2 story home with bedroom on 1st floor Has following equipment at home: Single point cane and Walker - 2 wheeled, built in seat in shower that she does not use, grab bar next to the toilet, in plans for remodel in bathroom to elevate toilet seat  PLOF: Independent and Independent with basic ADLs; would ask for assistance with balance when navigating curbs in the community and if dropping item  PATIENT GOALS: to walk without tripping  OBJECTIVE:  Note: Objective measures were completed at Evaluation unless otherwise noted.  HAND DOMINANCE: Right  ADLs: Transfers/ambulation related to ADLs: Mod I, will occasionally use walker when vertigo is really bad Grooming: Mod I UB Dressing: Mod I LB Dressing: husband assisting with donning socks s/p knee surgery Toileting: Mod I Bathing: Mod I Tub Shower transfers: Mod I   IADLs:  Pt is doing majority of the cooking and will do some household tasks but feels that she doesn't have the energy.  Pt reports that the lighting in stores will effect her.   MOBILITY STATUS: Hx of falls and occasional hand held assist especially when in the community, has used RW when experiencing more vertigo symptoms  POSTURE COMMENTS:  rounded shoulders and forward head   ACTIVITY TOLERANCE: Activity tolerance: decreased endurance s/p CVA  FUNCTIONAL OUTCOME MEASURES: PSFS   UPPER EXTREMITY ROM:  WFL bilaterally   UPPER EXTREMITY MMT:   Grossly 4+ to 5 overall   COORDINATION: 9 Hole Peg test: Right: 29.63 sec; Left: 33.28 sec  SENSATION: Pt reports slight N/T in R foot d/t  neuropathy   COGNITION: Overall cognitive status: Within functional limits for tasks assessed  VISION: Subjective report: wears glasses 80% of the time Baseline vision: Wears glasses all the time Visual history: cataracts removal  VISION ASSESSMENT: Tracking/Visual pursuits: Right eye does not track laterally and Decreased smoothness with horizontal tracking; Diplopia with vertical tracking upwards Convergence: Impaired: reporting diplopia 6-7 from nose Visual Fields: no apparent deficits  Completed Convergence Insufficiency Symptom Survey (CISS): 21/56 21 or higher is suggestive of convergence insufficiency  Patient has difficulty with following activities due to following visual impairments: reading, tying shoes  PERCEPTION: WFL  PRAXIS: WFL  TREATMENT DATE:  06/11/24 Golf solitaire: engaged in novel card activity with focus on memory and vision.  Pt with no reports of dizziness or disconjugate gaze during scanning portion of activity.  OT providing verbal instructions for setup to focus on memory.  Pt then playing Golf solitaire with focus on sequencing and recall of instructions.  Pt verbally processing throughout to keep up with the instructions.  OT educating pt on thinking ahead and making choices based on best and not always the quickest.  OT then educating on functional carryover with thinking ahead to routine tasks.  Memory: OT educating on use of list making, sticky notes, and Alexa for reminders and timers.  Pt reporting that she still is having difficulty with remembering to brush teeth and time management with doing morning routine and making it to appts on time.  Pt coming up with strategies based on education and options suggested to trial at home to increase recall and time management.      06/04/24 Initial focus of session discussion with pt and  spouse regarding memory strategies such as using medication organizer, using the checklist previously educated in other sessions to ensure taking meds, keeping items in the same place, doing certain tasks on days to encourage routine and recall, etc. Seated cognitive task for card matching, encouraging recall for matching game needing extended time but able to complete the task. Encouarged to do at home as well. Dynamic standing and visual scanning at mirror for #1-15 with only 1 error noted. Pt having to erase in order with only 1 error. Writing task at end of session to recall events during session, pt able to recall 3/3 tasks with mildly extended time.  06/02/24 CISS: Score 22.  Pt reporting losing concentration fairly often when reading, trouble remember what she has read, and feels like she reads slowly. Clock yourself: engaged in visual scanning in standing while tapping called numbers with R and L hand.  Pt able to complete 50 numbers per minute for 2:30 mins with alternating UE.  OT increased challenge to tapping even numbers with R hand and odd numbers with L hand, demonstrating increased challenge when scanning and alternating hands, reporting mild dizziness afterwards.   OT decreased pacing to 40 numbers per minute with pt completing only 1 error but reporting increased dizziness due to scanning.   Educated on paced breathing and focus when feeling dizziness coming on.  Pt reporting dizziness subsiding after a few mins of paced breathing. Visual scanning/attention: engaged in large peg board pattern replication with use of key to identify correlating colors, challenging alternating attention, and visual perception. Pt initially organizing pegs by color to increase sequencing and utilized bookmark to keep concentration.  Educated on functional carryover of task implications with systematic process to minimize onset of errors.    PATIENT EDUCATION: Education details: vision,  sequencing/organization, memory strategies Person educated: Patient and Spouse Education method: Explanation and Handouts Education comprehension: verbalized understanding and needs further education  HOME EXERCISE PROGRAM: Access Code: [email or web address] URL: [email or web address] Date: 04/29/2024 Prepared by: [facility] - Outpatient  Rehab - [facility]  Exercises - Seated Horizontal Smooth Pursuit  - 2 x daily - 10 reps - Seated Proximal-Distal Smooth Pursuit  - 2 x daily - 10 reps   GOALS: Goals reviewed with patient? Yes   SHORT TERM GOAL: Target Date 06/28/24 1  Pt will verbalize understanding of energy conservation strategies and recall 3 in use. Baseline: easily fatigued and decreased activity tolerance Goal status:  re-educated this date (05/28/24)  2. Pt will verbalize understanding of memory compensation strategies and recall 3 in use Baseline: To be educated Goal status: NEW   LONG TERM GOAL: Target Date 08/26/24 1  Pt will report improved score on CISS to <21 for improved convergence. Baseline: 21/56 Goal status:INITIAL  2.  Pt will complete table top visual scanning activity at Mod I level with use of adaptive strategies PRN. Baseline: TBD Goal status: Not met at initial POC  3. Pt will complete simulated med mgmt task with no errors   Baseline: Pt completed with approximately 50% accuracy, reports she has pill organizer for just morning and night, whereas testing box had 4 individual boxes for each day.  Goal status: NEW   ASSESSMENT:  CLINICAL IMPRESSION: Patient is a 74 y.o. female who was seen today for occupational therapy treatment for disconjugate gaze s/p CVA and cognitive retraining. Pt continues to be an active participant in novel tasks to address memory and organization as well as visual scanning and tracking. Pt demonstrating good strategies, however will continue to benefit from support to implement strategies in home to foster routine.   Pt would benefit from continued skilled services to improve ability to complete med mgmt and educate in techniques for memory compensation for improved safety and quality of life.  PERFORMANCE DEFICITS: in functional skills including ADLs, IADLs, ROM, balance, endurance, decreased knowledge of precautions, decreased knowledge of use of DME, and vision and psychosocial skills including coping strategies and environmental adaptation.     PLAN:  OT FREQUENCY: 1-2x/week  OT DURATION: 6 weeks  PLANNED INTERVENTIONS: 97168 OT Re-evaluation, 97535 self care/ADL training, 02889 therapeutic exercise, 97530 therapeutic activity, 97112 neuromuscular re-education, functional mobility training, visual/perceptual remediation/compensation, psychosocial skills training, energy conservation, coping strategies training, patient/family education, and DME and/or AE instructions  RECOMMENDED OTHER SERVICES: NA  CONSULTED AND AGREED WITH PLAN OF CARE: Patient and family member/caregiver  PLAN FOR NEXT SESSION:  Memory compensation education Continue education in techniques for improving med mgmt   [place], [place], OTR/L 06/11/2024, 2:32 PM  [facility] at [facility] [address] [place], [place] [address] Phone # [phone] Fax # [phone]          "

## 2024-06-11 NOTE — Therapy (Signed)
 " OUTPATIENT PHYSICAL THERAPY NEURO NOTE and D/C Summary   Patient Name: Vanessa Christensen MRN: [id number] DOB:[birth year], 74 y.o., female Today's Date: 06/11/2024   PCP:    Mahlon Comer BRAVO, MD   REFERRING PROVIDER: Raenelle Donalda HERO, MD   PHYSICAL THERAPY DISCHARGE SUMMARY  Visits from Start of Care: 43  Current functional level related to goals / functional outcomes: Improved outcomes with low risk for falls per score Berg Balance Test and Dynamic Gait Index and increased functional independence with reduced level of assistance needed   Remaining deficits: Balance, and supervision-CGA for ambulation outdoors on uneven surfaces   Education / Equipment: HEP   Patient agrees to discharge. Patient goals were met. Patient is being discharged due to meeting the stated rehab goals.   END OF SESSION:  PT End of Session - 06/11/24 1104     Visit Number 19    Number of Visits 20    Date for Recertification  06/11/24    Authorization Type Medicare/BCBS Federal    PT Start Time 1102    PT Stop Time 1145    PT Time Calculation (min) 43 min    Equipment Utilized During Treatment Gait belt    Activity Tolerance Patient tolerated treatment well    Behavior During Therapy WFL for tasks assessed/performed                      Past Medical History:  Diagnosis Date   Anemia    hx of  iron deficient   Anxiety    pt denies   Arthritis    hands and feet   Cancer (HCC) 10/04/2006   breast    left   Cataracts, both eyes    Depression    Depression    Diverticulosis of colon    GERD (gastroesophageal reflux disease)    Glaucoma    Hyperlipidemia    Hypertension    no meds   Joint pain    Knee pain    Obesity    OSA on CPAP    Osteoarthritis    Sleep apnea    no CPAP- no longer needed d/t weight loss   Thyroid  activity decreased    Past Surgical History:  Procedure Laterality Date   BREAST SURGERY Bilateral    CATARACT EXTRACTION     COLONOSCOPY      GLAUCOMA REPAIR     MASTECTOMY, RADICAL Bilateral    neulasta induced sterile abscesses     THYROIDECTOMY, PARTIAL     TONSILLECTOMY     TOTAL KNEE ARTHROPLASTY Right    TOTAL KNEE ARTHROPLASTY Left 10/27/2022   Procedure: LEFT TOTAL KNEE ARTHROPLASTY;  Surgeon: Vernetta Lonni GRADE, MD;  Location: [facility];  Service: Orthopedics;  Laterality: Left;   TOTAL KNEE REVISION Right 05/07/2020   Procedure: RIGHT TOTAL KNEE REVISION ARTHROPLASTY;  Surgeon: Vernetta Lonni GRADE, MD;  Location: [facility];  Service: Orthopedics;  Laterality: Right;   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   Generalized obesity 10/05/2022   Memory loss 06/12/2022   Essential hypertension 04/13/2022   Obesity (BMI 30-39.9) 04/13/2022   Stress incontinence 11/08/2021   Status post revision of total replacement of right knee 05/07/2020  Failed total knee, right, 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/2020   History of total right knee replacement 01/28/2020   Vitamin D  deficiency 08/29/2018   Insulin  resistance 08/29/2018   Other specified glaucoma 08/14/2018   Incontinence of urine in female 11/07/2017   Vertigo 11/07/2017   Left knee pain 10/25/2016   Genetic testing 08/08/2016   Hypothyroidism 12/02/2015   Anxiety and depression 12/02/2015   OAB (overactive bladder) 12/02/2015   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/02/2015   BMI 37.0-37.9, adult 12/02/2015    ONSET DATE: 10/10/125  REFERRING DIAG: I63.9 (ICD-10-CM) - CVA (cerebral vascular accident) (HCC)  THERAPY DIAG:  Dizziness and giddiness  Difficulty in walking, not elsewhere classified  Unsteadiness on feet  Muscle weakness  (generalized)  Rationale for Evaluation and Treatment: Rehabilitation  SUBJECTIVE:                                                                                                                                                                                             SUBJECTIVE STATEMENT: Feeling pretty good, no dizziness to speak of    Pt accompanied by: family member-spouse  PERTINENT HISTORY: Anemia, anxiety, L breast CA s/p B mastectomy, depression, HLD, HTN, B TKA  PAIN:  Are you having pain? No  PRECAUTIONS: Fall  RED FLAGS: None   WEIGHT BEARING RESTRICTIONS: No  FALLS: Has patient fallen in last 6 months? Yes. Number of falls 1  LIVING ENVIRONMENT: Lives with: lives with their spouse Lives in: House/apartment Stairs: 2 step to enter with handrail; 2 story home with bedroom on 1st floor Has following equipment at home: Single point cane and Walker - 2 wheeled  PLOF: Independent  PATIENT GOALS: improve dizziness  OBJECTIVE:   TODAY'S TREATMENT: 06/11/24 Activity Comments  Dynamic Gait Index 19/24  Gait training Trials with trekking poles outdoor surfaces CGA. Level surfaces independence.  Cues for safety and sequence with outdoor ambulation and curb negotiation w/ trekking poles  Walking VOR   POC review and discussion            [facility] PT Assessment - 06/11/24 0001       Dynamic Gait Index   Level Surface Normal    Change in Gait Speed Normal    Gait with Horizontal Head Turns Mild Impairment    Gait with Vertical Head Turns Normal    Gait and Pivot Turn Mild Impairment    Step Over Obstacle Moderate Impairment    Step Around Obstacles Normal    Steps Mild Impairment    Total Score 19          TODAY'S  TREATMENT: 06/10/23 Activity Comments  Bed mobility Sit to supine Rolling left-right Sit to supine: 2/10 lightheaded--brief  VOR Seated: no symptoms Standing: x 30 sec -horizontal: no symptoms -vertical: no symptoms  Walking VOR Difficulty  with sequencing, improved with support on counter  Single leg support -lift, hover, drop on 4 step 1x10 BUE/no UE support -alt toe taps standing on foam 2x60 sec (UE to no UE support)  Static multisensory balance   Gait training Trials with trekking poles and curb negotiation CGA-min A           PATIENT EDUCATION: Education details: advised to practice walking with B poles down hallway to improve sequencing . Advised pt ok to try walking without cane in church as long as she is holding onto pews d/t good performance walking quickly without AD today  Person educated: Patient and Spouse Education method: Explanation Education comprehension: verbalized understanding    Access Code: [email or web address] URL: [email or web address] Date: 05/26/2024 Prepared by: [facility] - Outpatient  Rehab - [facility]  Program Notes perform at counter top for safety  Exercises - Corner Balance Feet Together: Eyes Open With Head Turns  - 1 x daily - 7 x weekly - 3 sets - 30 sec hold - Standing with Head Nod  - 1 x daily - 5 x weekly - 3 sets - 30 sec hold - Corner Balance Feet Together: Eyes Closed With Head Turns  - 1 x daily - 7 x weekly - 3 sets - 30 sec hold - Standing Toe Taps  - 1 x daily - 5 x weekly - 2 sets - 10 reps - Alternating Step Forward with Support  - 1 x daily - 5 x weekly - 2 sets - 10 reps - Backward Walking with Counter Support  - 1 x daily - 5 x weekly - 1 sets - 3-5 reps - Side Stepping with Counter Support  - 1 x daily - 5 x weekly - 1 sets - 3-5 reps - Walking Gaze Stabilization Head Rotation  - 3 sets - 15-30 sec hold        PATIENT EDUCATION: Education details:05/26/2024:  Updates to HEP Person educated: Patient, husband Education method: Explanation, Demonstration, Tactile cues, Verbal cues, and Handouts Education comprehension: verbalized understanding and returned demonstration          Note: Objective measures were completed at Evaluation  unless otherwise noted.  DIAGNOSTIC FINDINGS: 03/14/24 brain MRI: Acute 6 mm right splenium lacunar infarct. No acute hemorrhage or mass effect. 2. Underlying signal changes of advanced chronic small vessel disease in the bilateral white matter and deep gray nuclei.  COGNITION: Overall cognitive status: Within functional limits for tasks assessed; husband assists with subjective report    SENSATION: Pt reports slight N/T in R foot d/t neuropathy   COORDINATION: Alternating pronation/supination: slight dysmetria on L Alternating toe tap: WNL Finger to nose: WNL  Vitals: 147/96 mmHg, 96% spO2, 80bpm     POSTURE: rounded shoulders and forward head  GAIT: Findings: Assistive device utilized:Single point cane, Level of assistance: Min A, and Comments: requires assistance for balance from husband   VESTIBULAR ASSESSMENT   GENERAL OBSERVATION: pt wears glasses but not sure what kind   OCULOMOTOR EXAM: Eye alignment: L eye elevated in orbit   Ocular ROM: limited R eye abduction   Spontaneous Nystagmus: absent   Gaze-Induced Nystagmus: absent   Smooth Pursuits: difficulty tracking R and c/o diplopia at 45 degrees gaze to each side   Saccades: disconjugate eye  movement with R eye overshooting/missing target repeatedly and c/o diplopia with R gaze   Convergence/Divergence: 12 inches c/o diplopia cm    VESTIBULAR - OCULAR REFLEX:    Slow VOR: Normal   VOR Cancellation: Unable to Maintain Gaze; c/o diplopia   Head-Impulse Test: HIT Right: negative HIT Left: negative    POSITIONAL TESTING:   Right Sidelying: strong anxiety response and c/o dizziness with nystagmus but unable to identify d/t pt closing eyes Left Sidelying: negative                                                                                                                               TREATMENT DATE: 03/24/24    PATIENT EDUCATION: Education details: prognosis, POC, exam findings and need for MD f/u to  address new oculomotor findings that were not documented in the hospital  Person educated: Patient and Spouse Education method: Explanation Education comprehension: verbalized understanding  HOME EXERCISE PROGRAM: Not yet initiated    GOALS: Goals reviewed with patient? Yes  SHORT TERM GOALS: Target date: 04/21/2024  Patient to be independent with initial HEP. Baseline: HEP initiated Goal status: MET    LONG TERM GOALS: Target date: 06/11/2024  Patient to be independent with advanced HEP. Baseline: Not yet initiated  Goal status: MET  Patient to report 0/10 dizziness with standing vertical and horizontal VOR for 30 seconds. Baseline: Unable; 1/10 dizziness horizontal; asymptomatic Goal status: MET  Patient will report 0/10 dizziness with bed mobility.  Baseline: Symptomatic; R roll: 2-3/10 dizziness Rolling supine 1-2/10 dizziness  R side > sit 3-4/10 dizziness 05/07/24  Goal status: MET   Patient to demonstrate ambulation for 333ft with LRAD and supervision for safety.  Baseline: min A d/t imbalance; Completed 36ft in 3 minutes with supervision 05/07/24 Goal status: MET 05/07/24  Patient to score at least 15/24 on DGI in order to decrease risk of falls. Baseline: deferred d/t safety 04/10/24; 10/24; 19/24 Goal status: MET  Patient to score at least 40/56 on Berg in order to decrease risk of falls. Baseline: 34/56 04/10/24; 45/56 05/07/24  Goal status: MET 05/07/24    ASSESSMENT:  CLINICAL IMPRESSION: Pt reports overall improvements in functional mobility and confidence with balance and ambulation.  Reports dizziness has now been absent for a while and activities are not provoking to it.  Improved balance manifest with score 45/56 Berg Balance and 19/24 Dynamic Gait Index both indicative of low risk for falls and significantly improved from start of care.  Improved functional independence with ambulation initially requiring min A and now independent on level surfaces and  Supervision-CGA for outdoors/uneven surfaces.  Pt and spouse report confidence in self management at this time and will D/C to HEP  OBJECTIVE IMPAIRMENTS: Abnormal gait, decreased activity tolerance, decreased balance, decreased coordination, difficulty walking, and dizziness.   ACTIVITY LIMITATIONS: carrying, lifting, bending, sitting, standing, squatting, sleeping, stairs, transfers, bed mobility, bathing, toileting, dressing, reach over head, hygiene/grooming, locomotion level, and  caring for others  PARTICIPATION LIMITATIONS: meal prep, cleaning, laundry, driving, shopping, community activity, yard work, and church  PERSONAL FACTORS: Age, Behavior pattern, Past/current experiences, Time since onset of injury/illness/exacerbation, and 3+ comorbidities: Anemia, anxiety, L breast CA s/p B mastectomy, depression, HLD, HTN, B TKA are also affecting patient's functional outcome.   REHAB POTENTIAL: Good  CLINICAL DECISION MAKING: Evolving/moderate complexity  EVALUATION COMPLEXITY: Moderate  PLAN:  PT FREQUENCY: 1-2x/week  PT DURATION: other: 5 weeks   PLANNED INTERVENTIONS: [id number]- PT Re-evaluation, 97750- Physical Performance Testing, 97110-Therapeutic exercises, 97530- Therapeutic activity, [id number]- Neuromuscular re-education, 97535- Self Care, [id number]- Manual therapy, [id number]- Gait training, [phone]- Canalith repositioning, Patient/Family education, Balance training, Stair training, Taping, Vestibular training, Cryotherapy, and Moist heat  PLAN FOR NEXT SESSION:  D/C to HEP   11:04 AM, 06/11/2024 M. Kelly Aralynn Brake, PT, DPT Physical Therapist- [place] Office Number: [phone]       "

## 2024-06-12 ENCOUNTER — Ambulatory Visit: Payer: Self-pay | Admitting: Family Medicine

## 2024-06-12 NOTE — Patient Instructions (Signed)
 SURGICAL WAITING ROOM VISITATION  Patients having surgery or a procedure may have no more than 2 support people in the waiting area - these visitors may rotate.    Children ages 75 and under will not be able to visit patients in [facility] under most circumstances.   Visitors with respiratory illnesses are discouraged from visiting and should remain at home.  If the patient needs to stay at the hospital during part of their recovery, the visitor guidelines for inpatient rooms apply. Pre-op nurse will coordinate an appropriate time for 1 support person to accompany patient in pre-op.  This support person may not rotate.    Please refer to the [facility] website for the visitor guidelines for Inpatients (after your surgery is over and you are in a regular room).       Your procedure is scheduled on: 06/26/2024    Report to [facility] Main Entrance    Report to admitting at   0930AM   Call this number if you have problems the morning of surgery [phone]   Do not eat food  or dirnk liquids :After Midnight.               If you have questions, please contact your surgeons office.      Oral Hygiene is also important to reduce your risk of infection.                                    Remember - BRUSH YOUR TEETH THE MORNING OF SURGERY WITH YOUR REGULAR TOOTHPASTE  DENTURES WILL BE REMOVED PRIOR TO SURGERY PLEASE DO NOT APPLY Poly grip OR ADHESIVES!!!   Do NOT smoke after Midnight   Stop all vitamins and herbal supplements 7 days before surgery.   Take these medicines the morning of surgery with A SIP OF WATER :  wellbutrin , zyrtec, clonazepam  if needed, eye drops as usual , myrbetriq , synthroid , prozAc    DO NOT TAKE ANY ORAL DIABETIC MEDICATIONS DAY OF YOUR SURGERY  Bring CPAP mask and tubing day of surgery.                              You may not have any metal on your body including hair pins, jewelry, and body piercing             Do not wear  make-up, lotions, powders, perfumes/cologne, or deodorant  Do not wear nail polish including gel and S&S, artificial/acrylic nails, or any other type of covering on natural nails including finger and toenails. If you have artificial nails, gel coating, etc. that needs to be removed by a nail salon please have this removed prior to surgery or surgery may need to be canceled/ delayed if the surgeon/ anesthesia feels like they are unable to be safely monitored.   Do not shave  48 hours prior to surgery.               Men may shave face and neck.   Do not bring valuables to the hospital. [facility] IS NOT             RESPONSIBLE   FOR VALUABLES.   Contacts, glasses, dentures or bridgework may not be worn into surgery.   Bring small overnight bag day of surgery.   DO NOT BRING YOUR HOME MEDICATIONS TO THE HOSPITAL. PHARMACY WILL  DISPENSE MEDICATIONS LISTED ON YOUR MEDICATION LIST TO YOU DURING YOUR ADMISSION IN THE HOSPITAL!    Patients discharged on the day of surgery will not be allowed to drive home.  Someone NEEDS to stay with you for the first 24 hours after anesthesia.   Special Instructions: Bring a copy of your healthcare power of attorney and living will documents the day of surgery if you haven't scanned them before.              Please read over the following fact sheets you were given: IF YOU HAVE QUESTIONS ABOUT YOUR PRE-OP INSTRUCTIONS PLEASE CALL [phone].   If you received a COVID test during your pre-op visit  it is requested that you wear a mask when out in public, stay away from anyone that may not be feeling well and notify your surgeon if you develop symptoms. If you test positive for Covid or have been in contact with anyone that has tested positive in the last 10 days please notify you surgeon.    [facility] - Preparing for Surgery Before surgery, you can play an important role.  Because skin is not sterile, your skin needs to be as free of germs as possible.  You can  reduce the number of germs on your skin by washing with CHG (chlorahexidine gluconate) soap before surgery.  CHG is an antiseptic cleaner which kills germs and bonds with the skin to continue killing germs even after washing. Please DO NOT use if you have an allergy to CHG or antibacterial soaps.  If your skin becomes reddened/irritated stop using the CHG and inform your nurse when you arrive at Short Stay. Do not shave (including legs and underarms) for at least 48 hours prior to the first CHG shower.  You may shave your face/neck. Please follow these instructions carefully:  1.  Shower with CHG Soap the night before surgery and the  morning of Surgery.  2.  If you choose to wash your hair, wash your hair first as usual with your  normal  shampoo.  3.  After you shampoo, rinse your hair and body thoroughly to remove the  shampoo.                           4.  Use CHG as you would any other liquid soap.  You can apply chg directly  to the skin and wash                       Gently with a scrungie or clean washcloth.  5.  Apply the CHG Soap to your body ONLY FROM THE NECK DOWN.   Do not use on face/ open                           Wound or open sores. Avoid contact with eyes, ears mouth and genitals (private parts).                       Wash face,  Genitals (private parts) with your normal soap.             6.  Wash thoroughly, paying special attention to the area where your surgery  will be performed.  7.  Thoroughly rinse your body with warm water  from the neck down.  8.  DO NOT shower/wash with your normal soap after using and  rinsing off  the CHG Soap.                9.  Pat yourself dry with a clean towel.            10.  Wear clean pajamas.            11.  Place clean sheets on your bed the night of your first shower and do not  sleep with pets. Day of Surgery : Do not apply any lotions/deodorants the morning of surgery.  Please wear clean clothes to the hospital/surgery center.  FAILURE TO  FOLLOW THESE INSTRUCTIONS MAY RESULT IN THE CANCELLATION OF YOUR SURGERY PATIENT SIGNATURE_________________________________  NURSE SIGNATURE__________________________________  ________________________________________________________________________

## 2024-06-12 NOTE — Progress Notes (Signed)
 Anesthesia Review:  PCP: Cardiologist :  PPM/ ICD: Device Orders: Rep Notified:  Chest x-ray : EKG :03/14/24  Echo : 03/15/24  Stress test: Cardiac Cath :   Activity level:  Sleep Study/ CPAP : Fasting Blood Sugar :      / Checks Blood Sugar -- times a day:    Blood Thinner/ Instructions /Last Dose: ASA / Instructions/ Last Dose :    81 mg aspirin 

## 2024-06-16 ENCOUNTER — Other Ambulatory Visit: Payer: Self-pay | Admitting: Family Medicine

## 2024-06-17 ENCOUNTER — Ambulatory Visit: Admitting: Occupational Therapy

## 2024-06-17 ENCOUNTER — Encounter (HOSPITAL_COMMUNITY): Payer: Self-pay

## 2024-06-17 ENCOUNTER — Other Ambulatory Visit: Payer: Self-pay

## 2024-06-17 ENCOUNTER — Encounter (HOSPITAL_COMMUNITY)
Admission: RE | Admit: 2024-06-17 | Discharge: 2024-06-17 | Disposition: A | Source: Ambulatory Visit | Attending: Urology | Admitting: Urology

## 2024-06-17 VITALS — BP 158/91 | HR 72 | Temp 98.1°F | Resp 16 | Ht 62.0 in | Wt 197.0 lb

## 2024-06-17 DIAGNOSIS — Z01812 Encounter for preprocedural laboratory examination: Secondary | ICD-10-CM | POA: Diagnosis present

## 2024-06-17 DIAGNOSIS — N3946 Mixed incontinence: Secondary | ICD-10-CM | POA: Insufficient documentation

## 2024-06-17 DIAGNOSIS — Z9013 Acquired absence of bilateral breasts and nipples: Secondary | ICD-10-CM | POA: Insufficient documentation

## 2024-06-17 DIAGNOSIS — Z87891 Personal history of nicotine dependence: Secondary | ICD-10-CM | POA: Insufficient documentation

## 2024-06-17 DIAGNOSIS — R29818 Other symptoms and signs involving the nervous system: Secondary | ICD-10-CM

## 2024-06-17 DIAGNOSIS — I10 Essential (primary) hypertension: Secondary | ICD-10-CM | POA: Insufficient documentation

## 2024-06-17 DIAGNOSIS — Z01818 Encounter for other preprocedural examination: Secondary | ICD-10-CM

## 2024-06-17 DIAGNOSIS — F32A Depression, unspecified: Secondary | ICD-10-CM | POA: Insufficient documentation

## 2024-06-17 DIAGNOSIS — Z8673 Personal history of transient ischemic attack (TIA), and cerebral infarction without residual deficits: Secondary | ICD-10-CM | POA: Insufficient documentation

## 2024-06-17 DIAGNOSIS — R41844 Frontal lobe and executive function deficit: Secondary | ICD-10-CM

## 2024-06-17 DIAGNOSIS — R41842 Visuospatial deficit: Secondary | ICD-10-CM

## 2024-06-17 DIAGNOSIS — R42 Dizziness and giddiness: Secondary | ICD-10-CM | POA: Diagnosis not present

## 2024-06-17 DIAGNOSIS — Z853 Personal history of malignant neoplasm of breast: Secondary | ICD-10-CM | POA: Insufficient documentation

## 2024-06-17 DIAGNOSIS — E039 Hypothyroidism, unspecified: Secondary | ICD-10-CM | POA: Insufficient documentation

## 2024-06-17 DIAGNOSIS — I080 Rheumatic disorders of both mitral and aortic valves: Secondary | ICD-10-CM | POA: Insufficient documentation

## 2024-06-17 HISTORY — DX: Cerebral infarction, unspecified: I63.9

## 2024-06-17 LAB — BASIC METABOLIC PANEL WITH GFR
Anion gap: 9 (ref 5–15)
BUN: 12 mg/dL (ref 8–23)
CO2: 26 mmol/L (ref 22–32)
Calcium: 9.8 mg/dL (ref 8.9–10.3)
Chloride: 104 mmol/L (ref 98–111)
Creatinine, Ser: 0.71 mg/dL (ref 0.44–1.00)
GFR, Estimated: >60 mL/min
Glucose, Bld: 84 mg/dL (ref 70–99)
Potassium: 4.3 mmol/L (ref 3.5–5.1)
Sodium: 140 mmol/L (ref 135–145)

## 2024-06-17 NOTE — Therapy (Signed)
 " OUTPATIENT OCCUPATIONAL THERAPY NEURO  Treatment Note   Patient Name: Vanessa Christensen MRN: [id number] DOB:[birth year], 74 y.o., female Today's Date: 06/17/2024  PCP: Mahlon Comer BRAVO, MD REFERRING PROVIDER:   Douglass Kenney NOVAK, FNP    END OF SESSION:  OT End of Session - 06/17/24 1006     Visit Number 13    Number of Visits 22    Date for Recertification  08/26/24    Authorization Type Medicare Part A&B / BCBS Fed 2025    OT Start Time [phone]    OT Stop Time 1016    OT Time Calculation (min) 42 min    Activity Tolerance Patient tolerated treatment well    Behavior During Therapy WFL for tasks assessed/performed                     Past Medical History:  Diagnosis Date   Anemia    hx of  iron deficient   Anxiety    pt denies   Arthritis    hands and feet   Cancer (HCC) 10/04/2006   breast    left   Cataracts, both eyes    Depression    Depression    Diverticulosis of colon    GERD (gastroesophageal reflux disease)    Glaucoma    Hyperlipidemia    Hypertension    no meds   Joint pain    Knee pain    Obesity    OSA on CPAP    Osteoarthritis    Sleep apnea    no CPAP- no longer needed d/t weight loss   Thyroid  activity decreased    Past Surgical History:  Procedure Laterality Date   BREAST SURGERY Bilateral    CATARACT EXTRACTION     COLONOSCOPY     GLAUCOMA REPAIR     MASTECTOMY, RADICAL Bilateral    neulasta induced sterile abscesses     THYROIDECTOMY, PARTIAL     TONSILLECTOMY     TOTAL KNEE ARTHROPLASTY Right    TOTAL KNEE ARTHROPLASTY Left 10/27/2022   Procedure: LEFT TOTAL KNEE ARTHROPLASTY;  Surgeon: Vernetta Lonni GRADE, MD;  Location: [facility];  Service: Orthopedics;  Laterality: Left;   TOTAL KNEE REVISION Right 05/07/2020   Procedure: RIGHT TOTAL KNEE REVISION ARTHROPLASTY;  Surgeon: Vernetta Lonni GRADE, MD;  Location: [facility];  Service: Orthopedics;  Laterality: Right;   Patient Active Problem List   Diagnosis Date Noted    CVA (cerebral vascular accident) (HCC) 03/14/2024   Cellulitis of right leg 12/21/2023   Onychomycosis 12/21/2023   Atherosclerosis of artery 03/12/2023   Adrenal nodule 11/16/2022   Thyroid  nodule 11/16/2022   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   Generalized obesity 10/05/2022   Memory loss 06/12/2022   Essential hypertension 04/13/2022   Obesity (BMI 30-39.9) 04/13/2022   Stress incontinence 11/08/2021   Status post revision of total replacement of right knee 05/07/2020   Failed total knee, right, 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/2020   History of total right knee replacement 01/28/2020   Vitamin D  deficiency 08/29/2018   Insulin  resistance 08/29/2018   Other specified glaucoma 08/14/2018   Incontinence of urine in female 11/07/2017   Vertigo 11/07/2017   Left knee pain 10/25/2016   Genetic testing 08/08/2016   Hypothyroidism 12/02/2015   Anxiety and depression 12/02/2015   OAB (overactive bladder) 12/02/2015   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/02/2015   BMI 37.0-37.9, adult 12/02/2015    ONSET DATE: referral 04/14/24  REFERRING DIAG: H51.9 (ICD-10-CM) - Abnormal eye movements   THERAPY DIAG:  Visuospatial deficit  Other symptoms and signs involving the nervous system  Frontal lobe and executive function deficit  Rationale for Evaluation and Treatment: Rehabilitation  SUBJECTIVE:   SUBJECTIVE STATEMENT: Pt reports that she has been using the pill map, but that they plan to laminate it.  Pt accompanied by: self and significant other  PERTINENT HISTORY: 74 y.o. female with medical history significant of hypertension, hyperlipidemia who presented to the ED due to dizziness.  Patient had been dizzy for 24 hours which has been progressively worsening and with vomiting.  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  On PT eval pt with disconjugate gaze with R not tracking with L  eye.  PMH/o Anemia, anxiety, L breast CA s/p B mastectomy, depression, HLD, HTN, B TKA   PRECAUTIONS: Fall  WEIGHT BEARING RESTRICTIONS: No  PAIN:  Are you having pain? No  FALLS: Has patient fallen in last 6 months? Yes. Number of falls 2 - one in the backyard and one entering restaurant   LIVING ENVIRONMENT: Lives with: lives with their spouse Lives in: House/apartment Stairs: 2 step to enter with handrail; 2 story home with bedroom on 1st floor Has following equipment at home: Single point cane and Walker - 2 wheeled, built in seat in shower that she does not use, grab bar next to the toilet, in plans for remodel in bathroom to elevate toilet seat  PLOF: Independent and Independent with basic ADLs; would ask for assistance with balance when navigating curbs in the community and if dropping item  PATIENT GOALS: to walk without tripping  OBJECTIVE:  Note: Objective measures were completed at Evaluation unless otherwise noted.  HAND DOMINANCE: Right  ADLs: Transfers/ambulation related to ADLs: Mod I, will occasionally use walker when vertigo is really bad Grooming: Mod I UB Dressing: Mod I LB Dressing: husband assisting with donning socks s/p knee surgery Toileting: Mod I Bathing: Mod I Tub Shower transfers: Mod I   IADLs:  Pt is doing majority of the cooking and will do some household tasks but feels that she doesn't have the energy.  Pt reports that the lighting in stores will effect her.   MOBILITY STATUS: Hx of falls and occasional hand held assist especially when in the community, has used RW when experiencing more vertigo symptoms  POSTURE COMMENTS:  rounded shoulders and forward head   ACTIVITY TOLERANCE: Activity tolerance: decreased endurance s/p CVA  FUNCTIONAL OUTCOME MEASURES: PSFS   UPPER EXTREMITY ROM:  WFL bilaterally   UPPER EXTREMITY MMT:   Grossly 4+ to 5 overall   COORDINATION: 9 Hole Peg test: Right: 29.63 sec; Left: 33.28  sec  SENSATION: Pt reports slight N/T in R foot d/t neuropathy   COGNITION: Overall cognitive status: Within functional limits for tasks assessed  VISION: Subjective report: wears glasses 80% of the time Baseline vision: Wears glasses all the time Visual history: cataracts removal  VISION ASSESSMENT: Tracking/Visual pursuits: Right eye does not track laterally and Decreased smoothness with horizontal tracking; Diplopia with vertical tracking upwards Convergence: Impaired: reporting diplopia 6-7 from nose Visual Fields: no apparent deficits  Completed Convergence Insufficiency Symptom Survey (CISS): 21/56 21 or higher is suggestive of convergence insufficiency  Patient has difficulty with following activities due to following visual impairments: reading, tying shoes  PERCEPTION: WFL  PRAXIS: WFL  TREATMENT DATE:  06/17/24 Organization: engaged in making a list of routines for AM and PM to increase recall.  Encouraged pt to laminate or place on dry erase board to allow pt to check off daily and then erase for the next day.  Discussed breaking tasks into portions to decrease anxiety and allow more achievable tasks.  Educated on completing tasks at most alert and engaged time and not wait to time crunch or fatigued. Pattern replication: engaged in animal picture patterns to facilitate organization, memory, and multi-step tasks to promote increased planning and allowing carryover to thinking ahead.  Pt benefiting from min question cues and mod increased time to complete task and educating on carryover over task to functional tasks.  When given ample time and min question cues pt able to complete, reflecting on need to look at the whole picture and then focus on one aspect at a time.   06/11/24 Golf solitaire: engaged in novel card activity with focus on memory and  vision.  Pt with no reports of dizziness or disconjugate gaze during scanning portion of activity.  OT providing verbal instructions for setup to focus on memory.  Pt then playing Golf solitaire with focus on sequencing and recall of instructions.  Pt verbally processing throughout to keep up with the instructions.  OT educating pt on thinking ahead and making choices based on best and not always the quickest.  OT then educating on functional carryover with thinking ahead to routine tasks.  Memory: OT educating on use of list making, sticky notes, and Alexa for reminders and timers.  Pt reporting that she still is having difficulty with remembering to brush teeth and time management with doing morning routine and making it to appts on time.  Pt coming up with strategies based on education and options suggested to trial at home to increase recall and time management.      06/04/24 Initial focus of session discussion with pt and spouse regarding memory strategies such as using medication organizer, using the checklist previously educated in other sessions to ensure taking meds, keeping items in the same place, doing certain tasks on days to encourage routine and recall, etc. Seated cognitive task for card matching, encouraging recall for matching game needing extended time but able to complete the task. Encouarged to do at home as well. Dynamic standing and visual scanning at mirror for #1-15 with only 1 error noted. Pt having to erase in order with only 1 error. Writing task at end of session to recall events during session, pt able to recall 3/3 tasks with mildly extended time.   PATIENT EDUCATION: Education details: vision, sequencing/organization, memory strategies Person educated: Patient and Spouse Education method: Explanation and Handouts Education comprehension: verbalized understanding and needs further education  HOME EXERCISE PROGRAM: Access Code: [email or web address] URL:  [email or web address] Date: 04/29/2024 Prepared by: [facility] - Outpatient  Rehab - [facility]  Exercises - Seated Horizontal Smooth Pursuit  - 2 x daily - 10 reps - Seated Proximal-Distal Smooth Pursuit  - 2 x daily - 10 reps   GOALS: Goals reviewed with patient? Yes   SHORT TERM GOAL: Target Date 06/28/24 1  Pt will verbalize understanding of energy conservation strategies and recall 3 in use. Baseline: easily fatigued and decreased activity tolerance Goal status: re-educated this date (05/28/24)  2. Pt will verbalize understanding of memory compensation strategies and recall 3 in use Baseline: To be educated Goal status: NEW   LONG TERM GOAL: Target Date 08/26/24 1  Pt will report improved score on CISS to <21 for improved convergence. Baseline: 21/56 Goal status:INITIAL  2.  Pt will complete table top visual scanning activity at Mod I level with use of adaptive strategies PRN. Baseline: TBD Goal status: Not met at initial POC  3. Pt will complete simulated med mgmt task with no errors   Baseline: Pt completed with approximately 50% accuracy, reports she has pill organizer for just morning and night, whereas testing box had 4 individual boxes for each day.  Goal status: NEW   ASSESSMENT:  CLINICAL IMPRESSION: Patient is a 74 y.o. female who was seen today for occupational therapy treatment for disconjugate gaze s/p CVA and cognitive retraining. Pt continues to be an active participant in novel tasks and sequencing of routine tasks to address memory and organization as well as visual scanning and tracking. Pt requiring increased time and min question cues for problem solving and organization of picture pattern this session.  Pt demonstrating understanding of functional carryover to previous and functional tasks.  Pt demonstrating good strategies, however will continue to benefit from support to implement strategies in home to foster routine.  Pt would  benefit from continued skilled services to improve ability to complete med mgmt and educate in techniques for memory compensation for improved safety and quality of life.  PERFORMANCE DEFICITS: in functional skills including ADLs, IADLs, ROM, balance, endurance, decreased knowledge of precautions, decreased knowledge of use of DME, and vision and psychosocial skills including coping strategies and environmental adaptation.     PLAN:  OT FREQUENCY: 1-2x/week  OT DURATION: 6 weeks  PLANNED INTERVENTIONS: 97168 OT Re-evaluation, 97535 self care/ADL training, 02889 therapeutic exercise, 97530 therapeutic activity, 97112 neuromuscular re-education, functional mobility training, visual/perceptual remediation/compensation, psychosocial skills training, energy conservation, coping strategies training, patient/family education, and DME and/or AE instructions  RECOMMENDED OTHER SERVICES: NA  CONSULTED AND AGREED WITH PLAN OF CARE: Patient and family member/caregiver  PLAN FOR NEXT SESSION:  Memory compensation education Continue education in techniques for improving med mgmt Recall of routines, completion in timely manner  KAYLENE DOMINO, OTR/L 06/17/2024, 10:27 AM  [facility] at [facility] [address] [place], [place] [address] Phone # [phone] Fax # [phone]          "

## 2024-06-19 ENCOUNTER — Ambulatory Visit: Admitting: Occupational Therapy

## 2024-06-19 DIAGNOSIS — R41842 Visuospatial deficit: Secondary | ICD-10-CM

## 2024-06-19 DIAGNOSIS — R41844 Frontal lobe and executive function deficit: Secondary | ICD-10-CM

## 2024-06-19 DIAGNOSIS — R42 Dizziness and giddiness: Secondary | ICD-10-CM | POA: Diagnosis not present

## 2024-06-19 DIAGNOSIS — R29818 Other symptoms and signs involving the nervous system: Secondary | ICD-10-CM

## 2024-06-19 NOTE — Therapy (Signed)
 " OUTPATIENT OCCUPATIONAL THERAPY NEURO  Treatment Note   Patient Name: Vanessa Christensen MRN: [id number] DOB:1951/01/04, 74 y.o., female Today's Date: 06/19/2024  PCP: Mahlon Comer BRAVO, MD REFERRING PROVIDER:   Douglass Kenney NOVAK, FNP    END OF SESSION:  OT End of Session - 06/19/24 1516     Visit Number 14    Number of Visits 22    Date for Recertification  08/26/24    Authorization Type Medicare Part A&B / BCBS Fed 2025    OT Start Time 1410    OT Stop Time 1458    OT Time Calculation (min) 48 min    Activity Tolerance Patient tolerated treatment well    Behavior During Therapy WFL for tasks assessed/performed                      Past Medical History:  Diagnosis Date   Anemia    hx of  iron deficient   Anxiety    pt denies   Cancer (HCC) 10/04/2006   breast    left   Cataracts, both eyes    Depression    Depression    Diverticulosis of colon    Glaucoma    Hyperlipidemia    Hypertension    no meds   Joint pain    Knee pain    Obesity    Stroke (HCC)    TIA 03/14/24   Thyroid  activity decreased    Past Surgical History:  Procedure Laterality Date   BREAST SURGERY Bilateral    CATARACT EXTRACTION     COLONOSCOPY     GLAUCOMA REPAIR     MASTECTOMY, RADICAL Bilateral    neulasta induced sterile abscesses     THYROIDECTOMY, PARTIAL     TONSILLECTOMY     TOTAL KNEE ARTHROPLASTY Right    TOTAL KNEE ARTHROPLASTY Left 10/27/2022   Procedure: LEFT TOTAL KNEE ARTHROPLASTY;  Surgeon: Vernetta Lonni GRADE, MD;  Location: [facility];  Service: Orthopedics;  Laterality: Left;   TOTAL KNEE REVISION Right 05/07/2020   Procedure: RIGHT TOTAL KNEE REVISION ARTHROPLASTY;  Surgeon: Vernetta Lonni GRADE, MD;  Location: [facility];  Service: Orthopedics;  Laterality: Right;   Patient Active Problem List   Diagnosis Date Noted   CVA (cerebral vascular accident) (HCC) 03/14/2024   Cellulitis of right leg 12/21/2023   Onychomycosis 12/21/2023   Atherosclerosis of  artery 03/12/2023   Adrenal nodule 11/16/2022   Thyroid  nodule 11/16/2022   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   Generalized obesity 10/05/2022   Memory loss 06/12/2022   Essential hypertension 04/13/2022   Obesity (BMI 30-39.9) 04/13/2022   Stress incontinence 11/08/2021   Status post revision of total replacement of right knee 05/07/2020   Failed total knee, right, 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/2020   History of total right knee replacement 01/28/2020   Vitamin D  deficiency 08/29/2018   Insulin  resistance 08/29/2018   Other specified glaucoma 08/14/2018   Incontinence of urine in female 11/07/2017   Vertigo 11/07/2017   Left knee pain 10/25/2016   Genetic testing 08/08/2016   Hypothyroidism 12/02/2015   Anxiety and depression 12/02/2015   OAB (overactive bladder) 12/02/2015   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/02/2015   BMI 37.0-37.9, adult 12/02/2015    ONSET DATE: referral 04/14/24  REFERRING DIAG: H51.9 (ICD-10-CM) - Abnormal eye movements   THERAPY DIAG:  Other symptoms and signs involving the nervous system  Visuospatial deficit  Frontal lobe and executive function  deficit  Rationale for Evaluation and Treatment: Rehabilitation  SUBJECTIVE:   SUBJECTIVE STATEMENT: Pt reports that she is taking more initiative to prepare meals and feed the dogs.    Pt accompanied by: self and significant other  PERTINENT HISTORY: 74 y.o. female with medical history significant of hypertension, hyperlipidemia who presented to the ED due to dizziness.  Patient had been dizzy for 24 hours which has been progressively worsening and with vomiting.  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  On PT eval pt with disconjugate gaze with R not tracking with L eye.  PMH/o Anemia, anxiety, L breast CA s/p B mastectomy, depression, HLD, HTN, B TKA   PRECAUTIONS: Fall  WEIGHT BEARING  RESTRICTIONS: No  PAIN:  Are you having pain? No  FALLS: Has patient fallen in last 6 months? Yes. Number of falls 2 - one in the backyard and one entering restaurant   LIVING ENVIRONMENT: Lives with: lives with their spouse Lives in: House/apartment Stairs: 2 step to enter with handrail; 2 story home with bedroom on 1st floor Has following equipment at home: Single point cane and Walker - 2 wheeled, built in seat in shower that she does not use, grab bar next to the toilet, in plans for remodel in bathroom to elevate toilet seat  PLOF: Independent and Independent with basic ADLs; would ask for assistance with balance when navigating curbs in the community and if dropping item  PATIENT GOALS: to walk without tripping  OBJECTIVE:  Note: Objective measures were completed at Evaluation unless otherwise noted.  HAND DOMINANCE: Right  ADLs: Transfers/ambulation related to ADLs: Mod I, will occasionally use walker when vertigo is really bad Grooming: Mod I UB Dressing: Mod I LB Dressing: husband assisting with donning socks s/p knee surgery Toileting: Mod I Bathing: Mod I Tub Shower transfers: Mod I   IADLs:  Pt is doing majority of the cooking and will do some household tasks but feels that she doesn't have the energy.  Pt reports that the lighting in stores will effect her.   MOBILITY STATUS: Hx of falls and occasional hand held assist especially when in the community, has used RW when experiencing more vertigo symptoms  POSTURE COMMENTS:  rounded shoulders and forward head   ACTIVITY TOLERANCE: Activity tolerance: decreased endurance s/p CVA  FUNCTIONAL OUTCOME MEASURES: PSFS   UPPER EXTREMITY ROM:  WFL bilaterally   UPPER EXTREMITY MMT:   Grossly 4+ to 5 overall   COORDINATION: 9 Hole Peg test: Right: 29.63 sec; Left: 33.28 sec  SENSATION: Pt reports slight N/T in R foot d/t neuropathy   COGNITION: Overall cognitive status: Within functional limits for tasks  assessed  VISION: Subjective report: wears glasses 80% of the time Baseline vision: Wears glasses all the time Visual history: cataracts removal  VISION ASSESSMENT: Tracking/Visual pursuits: Right eye does not track laterally and Decreased smoothness with horizontal tracking; Diplopia with vertical tracking upwards Convergence: Impaired: reporting diplopia 6-7 from nose Visual Fields: no apparent deficits  Completed Convergence Insufficiency Symptom Survey (CISS): 21/56 21 or higher is suggestive of convergence insufficiency  Patient has difficulty with following activities due to following visual impairments: reading, tying shoes  PERCEPTION: WFL  PRAXIS: WFL  TREATMENT DATE:  06/19/24 Medication management/organization: Pt brought in pill map spreadsheet that her spouse had created.  Engaged in discussion about modifications to increase ease, by adding a space between AM and PM meds to increase distinction and ensure that all AM meds are organized together. Organization: educated on various strategies to increase engagement in routine, household tasks as pt reporting lacking the motivation or initiative to complete.  OT encouraging making a list and spreading tasks out throughout the week; dedicating a specific, short amount of time; breaking tasks into smaller chunks; and use of incentives.   Therapeutic activity: engaged in novel card game speed with focus on taking an aspect of previous golf solitaire task to address recall and increasing challenge by adding speed component and opponent to challenge pt's sequencing with decreased control of each outcome.  Pt benefiting from increased time, when possible, for recall, demonstrating improvements with repetition.  OT educating on functional carryover of task to routine tasks.     06/17/24 Organization: engaged  in making a list of routines for AM and PM to increase recall.  Encouraged pt to laminate or place on dry erase board to allow pt to check off daily and then erase for the next day.  Discussed breaking tasks into portions to decrease anxiety and allow more achievable tasks.  Educated on completing tasks at most alert and engaged time and not wait to time crunch or fatigued. Pattern replication: engaged in animal picture patterns to facilitate organization, memory, and multi-step tasks to promote increased planning and allowing carryover to thinking ahead.  Pt benefiting from min question cues and mod increased time to complete task and educating on carryover over task to functional tasks.  When given ample time and min question cues pt able to complete, reflecting on need to look at the whole picture and then focus on one aspect at a time.   06/11/24 Golf solitaire: engaged in novel card activity with focus on memory and vision.  Pt with no reports of dizziness or disconjugate gaze during scanning portion of activity.  OT providing verbal instructions for setup to focus on memory.  Pt then playing Golf solitaire with focus on sequencing and recall of instructions.  Pt verbally processing throughout to keep up with the instructions.  OT educating pt on thinking ahead and making choices based on best and not always the quickest.  OT then educating on functional carryover with thinking ahead to routine tasks.  Memory: OT educating on use of list making, sticky notes, and Alexa for reminders and timers.  Pt reporting that she still is having difficulty with remembering to brush teeth and time management with doing morning routine and making it to appts on time.  Pt coming up with strategies based on education and options suggested to trial at home to increase recall and time management.     PATIENT EDUCATION: Education details: vision, sequencing/organization, memory strategies Person educated: Patient and  Spouse Education method: Explanation and Handouts Education comprehension: verbalized understanding and needs further education  HOME EXERCISE PROGRAM: Access Code: [email or web address] URL: [email or web address] Date: 04/29/2024 Prepared by: [facility]  Exercises - Seated Horizontal Smooth Pursuit  - 2 x daily - 10 reps - Seated Proximal-Distal Smooth Pursuit  - 2 x daily - 10 reps   GOALS: Goals reviewed with patient? Yes   SHORT TERM GOAL: Target Date 06/28/24 1  Pt will verbalize understanding of energy conservation strategies and recall 3 in use.  Baseline: easily fatigued and decreased activity tolerance Goal status: re-educated this date (05/28/24)  2. Pt will verbalize understanding of memory compensation strategies and recall 3 in use Baseline: To be educated Goal status: NEW   LONG TERM GOAL: Target Date 08/26/24 1  Pt will report improved score on CISS to <21 for improved convergence. Baseline: 21/56 Goal status:INITIAL  2.  Pt will complete table top visual scanning activity at Mod I level with use of adaptive strategies PRN. Baseline: TBD Goal status: Not met at initial POC  3. Pt will complete simulated med mgmt task with no errors   Baseline: Pt completed with approximately 50% accuracy, reports she has pill organizer for just morning and night, whereas testing box had 4 individual boxes for each day.  Goal status: NEW   ASSESSMENT:  CLINICAL IMPRESSION: Patient is a 74 y.o. female who was seen today for occupational therapy treatment for disconjugate gaze s/p CVA and cognitive retraining. Pt continues to be an active participant in organization and planning to increase active participation in medication management and IADLs.  Pt continues to report that her anxiety impedes her participation.  Pt to make modifications to pill map to increase ease of use.  Pt would benefit from continued skilled services to improve  ability to complete med mgmt and educate in techniques for memory compensation for improved safety and quality of life.  PERFORMANCE DEFICITS: in functional skills including ADLs, IADLs, ROM, balance, endurance, decreased knowledge of precautions, decreased knowledge of use of DME, and vision and psychosocial skills including coping strategies and environmental adaptation.     PLAN:  OT FREQUENCY: 1-2x/week  OT DURATION: 6 weeks  PLANNED INTERVENTIONS: 97168 OT Re-evaluation, 97535 self care/ADL training, 02889 therapeutic exercise, 97530 therapeutic activity, 97112 neuromuscular re-education, functional mobility training, visual/perceptual remediation/compensation, psychosocial skills training, energy conservation, coping strategies training, patient/family education, and DME and/or AE instructions  RECOMMENDED OTHER SERVICES: NA  CONSULTED AND AGREED WITH PLAN OF CARE: Patient and family member/caregiver  PLAN FOR NEXT SESSION:  Memory compensation education Continue education in techniques for improving med mgmt Recall of routines, completion in timely manner  KAYLENE DOMINO, OTR/L 06/19/2024, 3:17 PM  [facility] [address] [place], [place] [address] Phone # [phone] Fax # [phone]          "

## 2024-06-23 ENCOUNTER — Ambulatory Visit: Admitting: Occupational Therapy

## 2024-06-23 DIAGNOSIS — R41842 Visuospatial deficit: Secondary | ICD-10-CM

## 2024-06-23 DIAGNOSIS — R29818 Other symptoms and signs involving the nervous system: Secondary | ICD-10-CM

## 2024-06-23 DIAGNOSIS — M6281 Muscle weakness (generalized): Secondary | ICD-10-CM

## 2024-06-23 DIAGNOSIS — R278 Other lack of coordination: Secondary | ICD-10-CM

## 2024-06-23 DIAGNOSIS — R42 Dizziness and giddiness: Secondary | ICD-10-CM | POA: Diagnosis not present

## 2024-06-23 NOTE — Therapy (Signed)
 " OUTPATIENT OCCUPATIONAL THERAPY NEURO  Treatment Note   Patient Name: Vanessa Christensen MRN: [id number] DOB:[birth year], 74 y.o., female Today's Date: 06/23/2024  PCP: Mahlon Comer BRAVO, MD REFERRING PROVIDER:   Douglass Kenney NOVAK, FNP    END OF SESSION:  OT End of Session - 06/23/24 1034     OT Start Time 0845    OT Stop Time 0929    OT Time Calculation (min) 44 min    Activity Tolerance Patient tolerated treatment well    Behavior During Therapy [facility] for tasks assessed/performed                       Past Medical History:  Diagnosis Date   Anemia    hx of  iron deficient   Anxiety    pt denies   Cancer (HCC) 10/04/2006   breast    left   Cataracts, both eyes    Depression    Depression    Diverticulosis of colon    Glaucoma    Hyperlipidemia    Hypertension    no meds   Joint pain    Knee pain    Obesity    Stroke (HCC)    TIA 03/14/24   Thyroid  activity decreased    Past Surgical History:  Procedure Laterality Date   BREAST SURGERY Bilateral    CATARACT EXTRACTION     COLONOSCOPY     GLAUCOMA REPAIR     MASTECTOMY, RADICAL Bilateral    neulasta induced sterile abscesses     THYROIDECTOMY, PARTIAL     TONSILLECTOMY     TOTAL KNEE ARTHROPLASTY Right    TOTAL KNEE ARTHROPLASTY Left 10/27/2022   Procedure: LEFT TOTAL KNEE ARTHROPLASTY;  Surgeon: Vernetta Lonni GRADE, MD;  Location: [facility];  Service: Orthopedics;  Laterality: Left;   TOTAL KNEE REVISION Right 05/07/2020   Procedure: RIGHT TOTAL KNEE REVISION ARTHROPLASTY;  Surgeon: Vernetta Lonni GRADE, MD;  Location: [facility];  Service: Orthopedics;  Laterality: Right;   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    Generalized obesity 10/05/2022   Memory loss 06/12/2022   Essential hypertension 04/13/2022   Obesity (BMI 30-39.9) 04/13/2022   Stress incontinence 11/08/2021   Status post revision of total replacement of right knee 05/07/2020   Failed total knee, right, 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/2020   History of total right knee replacement 01/28/2020   Vitamin D  deficiency 08/29/2018   Insulin  resistance 08/29/2018   Other specified glaucoma 08/14/2018   Incontinence of urine in female 11/07/2017   Vertigo 11/07/2017   Left knee pain 10/25/2016   Genetic testing 08/08/2016   Hypothyroidism 12/02/2015   Anxiety and depression 12/02/2015   OAB (overactive bladder) 12/02/2015   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/02/2015   BMI 37.0-37.9, adult 12/02/2015    ONSET DATE: referral 04/14/24  REFERRING DIAG: H51.9 (ICD-10-CM) - Abnormal eye movements   THERAPY DIAG:  Other symptoms and signs involving the nervous system  Visuospatial deficit  Other lack of coordination  Muscle weakness (generalized)  Rationale for Evaluation and Treatment: Rehabilitation  SUBJECTIVE:   SUBJECTIVE STATEMENT: Pt reports that she has been managing her meds well independently (spouse confirmed) and thinks she  may be ready to DC soon.  Pt accompanied by: self and significant other  PERTINENT HISTORY: 74 y.o. female with medical history significant of hypertension, hyperlipidemia who presented to the ED due to dizziness.  Patient had been dizzy for 24 hours which has been progressively worsening and with vomiting.  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  On PT eval pt with disconjugate gaze with R not tracking with L eye.  PMH/o Anemia, anxiety, L breast CA s/p B mastectomy, depression, HLD, HTN, B TKA   PRECAUTIONS: Fall  WEIGHT BEARING RESTRICTIONS: No  PAIN:  Are you having pain? No  FALLS: Has patient fallen in last  6 months? Yes. Number of falls 2 - one in the backyard and one entering restaurant   LIVING ENVIRONMENT: Lives with: lives with their spouse Lives in: House/apartment Stairs: 2 step to enter with handrail; 2 story home with bedroom on 1st floor Has following equipment at home: Single point cane and Walker - 2 wheeled, built in seat in shower that she does not use, grab bar next to the toilet, in plans for remodel in bathroom to elevate toilet seat  PLOF: Independent and Independent with basic ADLs; would ask for assistance with balance when navigating curbs in the community and if dropping item  PATIENT GOALS: to walk without tripping  OBJECTIVE:  Note: Objective measures were completed at Evaluation unless otherwise noted.  HAND DOMINANCE: Right  ADLs: Transfers/ambulation related to ADLs: Mod I, will occasionally use walker when vertigo is really bad Grooming: Mod I UB Dressing: Mod I LB Dressing: husband assisting with donning socks s/p knee surgery Toileting: Mod I Bathing: Mod I Tub Shower transfers: Mod I   IADLs:  Pt is doing majority of the cooking and will do some household tasks but feels that she doesn't have the energy.  Pt reports that the lighting in stores will effect her.   MOBILITY STATUS: Hx of falls and occasional hand held assist especially when in the community, has used RW when experiencing more vertigo symptoms  POSTURE COMMENTS:  rounded shoulders and forward head   ACTIVITY TOLERANCE: Activity tolerance: decreased endurance s/p CVA  FUNCTIONAL OUTCOME MEASURES: PSFS   UPPER EXTREMITY ROM:  WFL bilaterally   UPPER EXTREMITY MMT:   Grossly 4+ to 5 overall   COORDINATION: 9 Hole Peg test: Right: 29.63 sec; Left: 33.28 sec  SENSATION: Pt reports slight N/T in R foot d/t neuropathy   COGNITION: Overall cognitive status: Within functional limits for tasks assessed  VISION: Subjective report: wears glasses 80% of the time Baseline vision:  Wears glasses all the time Visual history: cataracts removal  VISION ASSESSMENT: Tracking/Visual pursuits: Right eye does not track laterally and Decreased smoothness with horizontal tracking; Diplopia with vertical tracking upwards Convergence: Impaired: reporting diplopia 6-7 from nose Visual Fields: no apparent deficits  Completed Convergence Insufficiency Symptom Survey (CISS): 21/56 21 or higher is suggestive of convergence insufficiency  Patient has difficulty with following activities due to following visual impairments: reading, tying shoes  PERCEPTION: WFL  PRAXIS: WFL  TREATMENT DATE:  06/23/24 Initial focus of session on reviewing strategies/techniques from previous OT sessions for med management including pill map (spouse going to edit per recommendation), making a visual aide laminated for bathroom to place on wall for max benefit for recalling routine, etc.  Cognition: Engaged pt in problem solving table top task initially complex level for planning out a schedule for the day - note pt needing extensive cues to attend, became easily distracted, etc. Needing MOD assist to problem solve - however able to plan her realistic day more quickly and easily than figurative scenario.  Recall/STM: Pt engaged in table top task for memory game for card matching, having moderate difficulty recalling placement of numbers/cards. Note pt and spouse state that she has significantly improved and would like to have discussion about DC from OT in upcoming sessions.  06/19/24 Medication management/organization: Pt brought in pill map spreadsheet that her spouse had created.  Engaged in discussion about modifications to increase ease, by adding a space between AM and PM meds to increase distinction and ensure that all AM meds are organized together. Organization: educated on  various strategies to increase engagement in routine, household tasks as pt reporting lacking the motivation or initiative to complete.  OT encouraging making a list and spreading tasks out throughout the week; dedicating a specific, short amount of time; breaking tasks into smaller chunks; and use of incentives.   Therapeutic activity: engaged in novel card game speed with focus on taking an aspect of previous golf solitaire task to address recall and increasing challenge by adding speed component and opponent to challenge pt's sequencing with decreased control of each outcome.  Pt benefiting from increased time, when possible, for recall, demonstrating improvements with repetition.  OT educating on functional carryover of task to routine tasks.     06/17/24 Organization: engaged in making a list of routines for AM and PM to increase recall.  Encouraged pt to laminate or place on dry erase board to allow pt to check off daily and then erase for the next day.  Discussed breaking tasks into portions to decrease anxiety and allow more achievable tasks.  Educated on completing tasks at most alert and engaged time and not wait to time crunch or fatigued. Pattern replication: engaged in animal picture patterns to facilitate organization, memory, and multi-step tasks to promote increased planning and allowing carryover to thinking ahead.  Pt benefiting from min question cues and mod increased time to complete task and educating on carryover over task to functional tasks.  When given ample time and min question cues pt able to complete, reflecting on need to look at the whole picture and then focus on one aspect at a time.       PATIENT EDUCATION: Education details: vision, sequencing/organization, memory strategies Person educated: Patient and Spouse Education method: Explanation and Handouts Education comprehension: verbalized understanding and needs further education  HOME EXERCISE PROGRAM: Access  Code: [email or web address] URL: [email or web address] Date: 04/29/2024 Prepared by: [facility] - Outpatient  Rehab - [facility]  Exercises - Seated Horizontal Smooth Pursuit  - 2 x daily - 10 reps - Seated Proximal-Distal Smooth Pursuit  - 2 x daily - 10 reps   GOALS: Goals reviewed with patient? Yes   SHORT TERM GOAL: Target Date 06/28/24 1  Pt will verbalize understanding of energy conservation strategies and recall 3 in use. Baseline: easily fatigued and decreased activity tolerance Goal status: re-educated this date (05/28/24)  2. Pt will verbalize understanding of memory compensation  strategies and recall 3 in use Baseline: To be educated Goal status: NEW   LONG TERM GOAL: Target Date 08/26/24 1  Pt will report improved score on CISS to <21 for improved convergence. Baseline: 21/56 Goal status:INITIAL  2.  Pt will complete table top visual scanning activity at Mod I level with use of adaptive strategies PRN. Baseline: TBD Goal status: Not met at initial POC  3. Pt will complete simulated med mgmt task with no errors   Baseline: Pt completed with approximately 50% accuracy, reports she has pill organizer for just morning and night, whereas testing box had 4 individual boxes for each day.  Goal status: NEW   ASSESSMENT:  CLINICAL IMPRESSION: Patient is a 74 y.o. female who was seen today for occupational therapy treatment for disconjugate gaze s/p CVA and cognitive retraining. Pt continues to be an active participant in organization and planning to increase active participation in medication management and IADLs.  Pt reporting anxiety has improved significantly. Pt able to recall strategies and techniques for memory/cognitive strategies - but states still needs to modify pill map for better adherence. Pt would benefit from continued skilled services to improve ability to complete med mgmt and educate in techniques for memory compensation for improved safety and quality  of life.  PERFORMANCE DEFICITS: in functional skills including ADLs, IADLs, ROM, balance, endurance, decreased knowledge of precautions, decreased knowledge of use of DME, and vision and psychosocial skills including coping strategies and environmental adaptation.     PLAN:  OT FREQUENCY: 1-2x/week  OT DURATION: 6 weeks  PLANNED INTERVENTIONS: 97168 OT Re-evaluation, 97535 self care/ADL training, 02889 therapeutic exercise, 97530 therapeutic activity, 97112 neuromuscular re-education, functional mobility training, visual/perceptual remediation/compensation, psychosocial skills training, energy conservation, coping strategies training, patient/family education, and DME and/or AE instructions  RECOMMENDED OTHER SERVICES: NA  CONSULTED AND AGREED WITH PLAN OF CARE: Patient and family member/caregiver  PLAN FOR NEXT SESSION:  Memory compensation education Continue education in techniques for improving med mgmt - follow up pill map modification Recall of routines, completion in timely manner Plan for upcoming potential DC?  Chiquita JAYSON Hopping, OTR/L 06/23/2024, 10:35 AM  [facility] at [facility] [address] [place], [address] [place], [place] [address] Phone # [phone] Fax # [phone]          "

## 2024-06-24 ENCOUNTER — Telehealth: Payer: Self-pay | Admitting: Neurology

## 2024-06-24 ENCOUNTER — Other Ambulatory Visit: Payer: Self-pay | Admitting: Family Medicine

## 2024-06-24 DIAGNOSIS — E785 Hyperlipidemia, unspecified: Secondary | ICD-10-CM

## 2024-06-24 NOTE — Telephone Encounter (Signed)
 Nena from [facility] called to  follow up  about clearance form they faxed over to office.  Went over what fax number she sent  forms to  .  Fax number were correct .Nena is requesting a call back  to  let them know  if forms was received   Callback number is [phone] ext. [phone]

## 2024-06-25 ENCOUNTER — Telehealth: Payer: Self-pay | Admitting: Neurology

## 2024-06-25 NOTE — Telephone Encounter (Signed)
 Thank you :)

## 2024-06-25 NOTE — Anesthesia Preprocedure Evaluation (Addendum)
 "                                  Anesthesia Evaluation  Patient identified by MRN, date of birth, ID band Patient awake    Reviewed: Allergy & Precautions, NPO status , Patient's Chart, lab work & pertinent test results  History of Anesthesia Complications Negative for: history of anesthetic complications  Airway Mallampati: II  TM Distance: >3 FB Neck ROM: Full    Dental  (+) Dental Advisory Given   Pulmonary neg shortness of breath, neg sleep apnea, neg COPD, neg recent URI, former smoker   Pulmonary exam normal breath sounds clear to auscultation       Cardiovascular hypertension (losartan ), Pt. on medications (-) angina (-) Past MI, (-) Cardiac Stents and (-) CABG (-) dysrhythmias + Valvular Problems/Murmurs (mild AI, mild MS) AI  Rhythm:Regular Rate:Normal  HLD  TTE 03/15/2024: IMPRESSIONS    1. Left ventricular ejection fraction, by estimation, is 55 to 60%. The  left ventricle has normal function. The left ventricle has no regional  wall motion abnormalities. Left ventricular diastolic parameters are  consistent with Grade I diastolic  dysfunction (impaired relaxation).   2. Right ventricular systolic function is normal. The right ventricular  size is normal. Tricuspid regurgitation signal is inadequate for assessing  PA pressure.   3. Left atrial size was mildly dilated.   4. The mitral valve is degenerative. Trivial mitral valve regurgitation.  Mild mitral stenosis. The mean mitral valve gradient is 4.0 mmHg with  average heart rate of 87 bpm. Moderate mitral annular calcification.   5. The aortic valve has an indeterminant number of cusps. Aortic valve  regurgitation is mild. Aortic valve sclerosis/calcification is present,  without any evidence of aortic stenosis.   6. The inferior vena cava is dilated in size with >50% respiratory  variability, suggesting right atrial pressure of 8 mmHg.     Neuro/Psych  PSYCHIATRIC DISORDERS Anxiety  Depression    Memory loss, vertigo CVA (03/14/2024 (has neurology clearance for procedure)), No Residual Symptoms    GI/Hepatic Neg liver ROS,neg GERD  ,,Diverticulosis    Endo/Other  neg diabetesHypothyroidism  Adrenal nodule  Renal/GU negative Renal ROS Bladder dysfunction      Musculoskeletal   Abdominal  (+) + obese  Peds  Hematology  (+) Blood dyscrasia, anemia Lab Results      Component                Value               Date                      WBC                      4.4                 06/09/2024                HGB                      11.8 (L)            06/09/2024                HCT  34.9 (L)            06/09/2024                MCV                      85.5                06/09/2024                PLT                      152.0               06/09/2024              Anesthesia Other Findings Glaucoma   Reproductive/Obstetrics H/o left breast cancer                              Anesthesia Physical Anesthesia Plan  ASA: 3  Anesthesia Plan: MAC   Post-op Pain Management: Tylenol  PO (pre-op)*   Induction: Intravenous  PONV Risk Score and Plan: 2 and Ondansetron , Dexamethasone  and Treatment may vary due to age or medical condition  Airway Management Planned: Simple Face Mask  Additional Equipment:   Intra-op Plan:   Post-operative Plan:   Informed Consent: I have reviewed the patients History and Physical, chart, labs and discussed the procedure including the risks, benefits and alternatives for the proposed anesthesia with the patient or authorized representative who has indicated his/her understanding and acceptance.     Dental advisory given  Plan Discussed with: CRNA and Anesthesiologist  Anesthesia Plan Comments: (See PAT note from 1/13  Discussed with patient risks of MAC including, but not limited to, minor pain or discomfort, hearing people in the room, and possible need for backup general  anesthesia. Risks for general anesthesia also discussed including, but not limited to, sore throat, hoarse voice, chipped/damaged teeth, injury to vocal cords, nausea and vomiting, allergic reactions, lung infection, heart attack, stroke, and death. All questions answered. )         Anesthesia Quick Evaluation  "

## 2024-06-25 NOTE — Progress Notes (Signed)
 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  Patient admitted from 10/10-10/11/25 for acute CVA. She presented with dizziness and diagnosed with acute 6 mm right splenium lacunar infarct. Admitted for CVA w/u which was overall unrevealing and felt to be due to small vessel disease. Echo obtained showed normal LVEF, grade I DD, mild MS with moderate MAC, mild AI.  She followed up with Neurology. Seen by Dr. Gregg on 05/06/24. She has residual symptoms of dizziness and diplopia. Also with nausea and vomiting. She has been participating in PT and OT. She completed DAPT for 3 weeks post CVA.   Neurology clearance requested since CVA was just over 3 months ago.  Addendum 1/21:  Neurology clearance still not obtained. Reviewed case with Dr. Leonce who feels it's ok to proceed.  VS: BP (!) 158/91   Pulse 72   Temp 36.7 C (Oral)   Resp 16   Ht 5' 2 (1.575 m)   Wt 89.4 kg   SpO2 100%   BMI 36.03 kg/m   PROVIDERS: Mahlon Comer BRAVO, MD   LABS: Labs reviewed: Acceptable for surgery. (all labs ordered are listed, but only abnormal results are displayed)  Labs Reviewed  BASIC METABOLIC PANEL WITH GFR     CTA head/neck 03/14/24:  IMPRESSION: 1. 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 No acute hemorrhage or significant mass effect. 2. No large vessel  occlusion or hemodynamically significant stenosis of the head or neck vessels.  MRI Brain 03/14/24:  IMPRESSION: 1. 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 No acute hemorrhage or mass effect. 2. Underlying signal changes of advanced chronic small vessel disease in the bilateral white matter and deep gray nuclei.   EKG 03/14/24:  SR Consider RAE   Echo 03/15/24:  :IMPRESSIONS    1. Left ventricular ejection fraction, by estimation, is 55 to 60%. The left ventricle has normal function. The left ventricle has no regional wall motion abnormalities. Left ventricular diastolic parameters are consistent with Grade I diastolic dysfunction (impaired relaxation).  2. Right ventricular systolic function is normal. The right ventricular size is normal. Tricuspid regurgitation signal is inadequate for assessing PA pressure.  3. Left atrial size was mildly dilated.  4. The mitral valve is degenerative. Trivial mitral valve regurgitation. Mild mitral stenosis. The mean mitral valve gradient is 4.0 mmHg with average heart rate of 87 bpm. Moderate mitral annular calcification.  5. The aortic valve has an indeterminant number of cusps. Aortic valve regurgitation is mild. Aortic valve sclerosis/calcification is present, without any evidence of aortic stenosis.  6. The inferior vena cava is dilated in size with >50% respiratory variability, suggesting right atrial pressure of 8 mmHg.  Past Medical History:  Diagnosis Date   Anemia    hx of  iron deficient   Anxiety    pt denies  Cancer (HCC) 10/04/2006   breast    left   Cataracts, both eyes    Depression    Depression    Diverticulosis of colon    Glaucoma    Hyperlipidemia    Hypertension    no meds   Joint pain    Knee pain    Obesity    Stroke (HCC)    TIA 03/14/24   Thyroid  activity decreased     Past Surgical History:  Procedure Laterality Date   BREAST SURGERY Bilateral    CATARACT EXTRACTION      COLONOSCOPY     GLAUCOMA REPAIR     MASTECTOMY, RADICAL Bilateral    neulasta induced sterile abscesses     THYROIDECTOMY, PARTIAL     TONSILLECTOMY     TOTAL KNEE ARTHROPLASTY Right    TOTAL KNEE ARTHROPLASTY Left 10/27/2022   Procedure: LEFT TOTAL KNEE ARTHROPLASTY;  Surgeon: Vernetta Lonni GRADE, MD;  Location: [facility];  Service: Orthopedics;  Laterality: Left;   TOTAL KNEE REVISION Right 05/07/2020   Procedure: RIGHT TOTAL KNEE REVISION ARTHROPLASTY;  Surgeon: Vernetta Lonni GRADE, MD;  Location: [facility];  Service: Orthopedics;  Laterality: Right;    MEDICATIONS:  Ascorbic Acid  (VITAMIN C PO)   aspirin  EC 81 MG tablet   B Complex-C (SUPER B COMPLEX PO)   buPROPion  (WELLBUTRIN  XL) 150 MG 24 hr tablet   cetirizine (ZYRTEC) 10 MG tablet   Cholecalciferol  (VITAMIN D ) 50 MCG (2000 UT) tablet   clonazePAM  (KLONOPIN ) 0.5 MG tablet   dorzolamide -timolol  (COSOPT ) 2-0.5 % ophthalmic solution   doxycycline  (VIBRA -TABS) 100 MG tablet   FLUoxetine  (PROZAC ) 40 MG capsule   levothyroxine  (SYNTHROID ) 75 MCG tablet   losartan  (COZAAR ) 25 MG tablet   meclizine  (ANTIVERT ) 12.5 MG tablet   mirabegron  ER (MYRBETRIQ ) 25 MG TB24 tablet   ondansetron  (ZOFRAN ) 4 MG tablet   psyllium (METAMUCIL) 58.6 % powder   rosuvastatin  (CRESTOR ) 10 MG tablet   traZODone  (DESYREL ) 50 MG tablet   No current facility-administered medications for this encounter.   Burnard CHRISTELLA Odis DEVONNA [facility] Short Stay/Anesthesiology [facility] Phone [phone] 06/25/2024 1:47 PM      "

## 2024-06-25 NOTE — Telephone Encounter (Signed)
 Clearence forms completed and faxed back to [facility].

## 2024-06-25 NOTE — Telephone Encounter (Signed)
 Called and spoke w/sandra at [facility] urology [phone] and she stated that she would fax the surgical clearance form to [phone]

## 2024-06-25 NOTE — Telephone Encounter (Signed)
 I personally handed this to Dr. Buck (AM WORK IN PROVIDER) on1/21/26

## 2024-06-25 NOTE — Telephone Encounter (Signed)
 Nena from [facility] provided updated fax number awaiting fax.

## 2024-06-25 NOTE — Telephone Encounter (Signed)
 Received surgical clearance form from [facility], patient is scheduled tomorrow on 06/26/2024 for a cystoscopy with Botox  and Bulkamid under MAC.  I reviewed Dr. Janean note from 05/06/2024.  Patient had a lacunar stroke in October 2025.  There is no obvious neurological contraindication for patient to have procedure.

## 2024-06-25 NOTE — Telephone Encounter (Signed)
 Nena from [facility] has called to f/u on request made yesterday for pt that is supposed to have surgery on tomorrow, please give her a call back as this is a time sensitive request.

## 2024-06-26 ENCOUNTER — Ambulatory Visit (HOSPITAL_COMMUNITY): Payer: Self-pay | Admitting: Anesthesiology

## 2024-06-26 ENCOUNTER — Encounter (HOSPITAL_COMMUNITY)
Admission: RE | Disposition: A | Discharge status: Home / Self Care | Payer: Self-pay | Source: Home / Self Care | Attending: Urology

## 2024-06-26 ENCOUNTER — Encounter (HOSPITAL_COMMUNITY): Payer: Self-pay | Admitting: Anesthesiology

## 2024-06-26 ENCOUNTER — Encounter (HOSPITAL_COMMUNITY): Payer: Self-pay | Admitting: Urology

## 2024-06-26 ENCOUNTER — Other Ambulatory Visit: Payer: Self-pay

## 2024-06-26 ENCOUNTER — Ambulatory Visit (HOSPITAL_COMMUNITY)
Admission: RE | Admit: 2024-06-26 | Discharge: 2024-06-26 | Disposition: A | Discharge status: Home / Self Care | Attending: Urology | Admitting: Urology

## 2024-06-26 DIAGNOSIS — E039 Hypothyroidism, unspecified: Secondary | ICD-10-CM | POA: Insufficient documentation

## 2024-06-26 DIAGNOSIS — Z79899 Other long term (current) drug therapy: Secondary | ICD-10-CM | POA: Diagnosis not present

## 2024-06-26 DIAGNOSIS — I10 Essential (primary) hypertension: Secondary | ICD-10-CM | POA: Diagnosis not present

## 2024-06-26 DIAGNOSIS — Z8673 Personal history of transient ischemic attack (TIA), and cerebral infarction without residual deficits: Secondary | ICD-10-CM | POA: Insufficient documentation

## 2024-06-26 DIAGNOSIS — Z87891 Personal history of nicotine dependence: Secondary | ICD-10-CM | POA: Diagnosis not present

## 2024-06-26 DIAGNOSIS — E785 Hyperlipidemia, unspecified: Secondary | ICD-10-CM | POA: Insufficient documentation

## 2024-06-26 DIAGNOSIS — R413 Other amnesia: Secondary | ICD-10-CM | POA: Insufficient documentation

## 2024-06-26 DIAGNOSIS — Z01818 Encounter for other preprocedural examination: Secondary | ICD-10-CM

## 2024-06-26 DIAGNOSIS — N3946 Mixed incontinence: Secondary | ICD-10-CM

## 2024-06-26 MED ORDER — DEXAMETHASONE SOD PHOSPHATE PF 10 MG/ML IJ SOLN
INTRAMUSCULAR | Status: AC
  Filled 2024-06-26: qty 1

## 2024-06-26 MED ORDER — OXYCODONE HCL 5 MG/5ML PO SOLN: 5.0000 mg | Freq: Once | ORAL | Status: DC | PRN

## 2024-06-26 MED ORDER — PROPOFOL 10 MG/ML IV BOLUS
INTRAVENOUS | Status: DC | PRN
  Administered 2024-06-26: 20 mg via INTRAVENOUS
  Administered 2024-06-26: 10 mg via INTRAVENOUS
  Administered 2024-06-26: 20 mg via INTRAVENOUS

## 2024-06-26 MED ORDER — PROPOFOL 500 MG/50ML IV EMUL
INTRAVENOUS | Status: DC | PRN
  Administered 2024-06-26: 140 ug/kg/min via INTRAVENOUS

## 2024-06-26 MED ORDER — ORAL CARE MOUTH RINSE: 15.0000 mL | Freq: Once | OROMUCOSAL | Status: AC

## 2024-06-26 MED ORDER — WATER FOR IRRIGATION, STERILE IR SOLN
Status: DC | PRN
  Administered 2024-06-26: 3000 mL

## 2024-06-26 MED ORDER — OXYCODONE HCL 5 MG PO TABS: 5.0000 mg | ORAL_TABLET | Freq: Once | ORAL | Status: DC | PRN

## 2024-06-26 MED ORDER — CEFAZOLIN SODIUM-DEXTROSE 2-4 GM/100ML-% IV SOLN
2.0000 g | INTRAVENOUS | Status: AC
  Administered 2024-06-26: 2 g via INTRAVENOUS
  Filled 2024-06-26: qty 100

## 2024-06-26 MED ORDER — SODIUM CHLORIDE (PF) 0.9 % IJ SOLN
INTRAMUSCULAR | Status: DC | PRN
  Administered 2024-06-26: 10 mL

## 2024-06-26 MED ORDER — ONDANSETRON HCL 4 MG/2ML IJ SOLN
INTRAMUSCULAR | Status: DC | PRN
  Administered 2024-06-26: 4 mg via INTRAVENOUS

## 2024-06-26 MED ORDER — DEXAMETHASONE SOD PHOSPHATE PF 10 MG/ML IJ SOLN
INTRAMUSCULAR | Status: DC | PRN
  Administered 2024-06-26: 4 mg via INTRAVENOUS

## 2024-06-26 MED ORDER — ONDANSETRON HCL 4 MG/2ML IJ SOLN
INTRAMUSCULAR | Status: AC
  Filled 2024-06-26: qty 2

## 2024-06-26 MED ORDER — SODIUM CHLORIDE (PF) 0.9 % IJ SOLN
INTRAMUSCULAR | Status: AC
  Filled 2024-06-26: qty 10

## 2024-06-26 MED ORDER — CHLORHEXIDINE GLUCONATE 0.12 % MT SOLN
15.0000 mL | Freq: Once | OROMUCOSAL | Status: AC
  Administered 2024-06-26: 15 mL via OROMUCOSAL

## 2024-06-26 MED ORDER — LACTATED RINGERS IV SOLN: INTRAVENOUS | Status: DC

## 2024-06-26 MED ORDER — LIDOCAINE HCL 1 % IJ SOLN
INTRAMUSCULAR | Status: DC | PRN
  Administered 2024-06-26: 40 mg via INTRADERMAL

## 2024-06-26 MED ORDER — CEFAZOLIN SODIUM-DEXTROSE 2-4 GM/100ML-% IV SOLN: 2.0000 g | INTRAVENOUS | Status: DC

## 2024-06-26 MED ORDER — PROPOFOL 1000 MG/100ML IV EMUL
INTRAVENOUS | Status: AC
  Filled 2024-06-26: qty 100

## 2024-06-26 MED ORDER — FENTANYL CITRATE (PF) 100 MCG/2ML IJ SOLN
INTRAMUSCULAR | Status: DC | PRN
  Administered 2024-06-26: 25 ug via INTRAVENOUS
  Administered 2024-06-26: 50 ug via INTRAVENOUS
  Administered 2024-06-26: 25 ug via INTRAVENOUS

## 2024-06-26 MED ORDER — FENTANYL CITRATE (PF) 100 MCG/2ML IJ SOLN
INTRAMUSCULAR | Status: AC
  Filled 2024-06-26: qty 2

## 2024-06-26 MED ORDER — ACETAMINOPHEN 500 MG PO TABS
1000.0000 mg | ORAL_TABLET | Freq: Once | ORAL | Status: AC
  Administered 2024-06-26: 1000 mg via ORAL
  Filled 2024-06-26: qty 2

## 2024-06-26 MED ORDER — AMISULPRIDE (ANTIEMETIC) 5 MG/2ML IV SOLN: 10.0000 mg | Freq: Once | INTRAVENOUS | Status: DC | PRN

## 2024-06-26 MED ORDER — ONABOTULINUMTOXINA 100 UNITS IJ SOLR
INTRAMUSCULAR | Status: DC | PRN
  Administered 2024-06-26: 100 [IU] via INTRAMUSCULAR

## 2024-06-26 MED ORDER — LIDOCAINE HCL (PF) 2 % IJ SOLN
INTRAMUSCULAR | Status: AC
  Filled 2024-06-26: qty 5

## 2024-06-26 MED ORDER — ONABOTULINUMTOXINA 100 UNITS IJ SOLR
INTRAMUSCULAR | Status: AC
  Filled 2024-06-26: qty 100

## 2024-06-26 MED ORDER — FENTANYL CITRATE (PF) 50 MCG/ML IJ SOSY: 25.0000 ug | PREFILLED_SYRINGE | INTRAMUSCULAR | Status: DC | PRN

## 2024-06-26 NOTE — Op Note (Signed)
 Operative Note   Preoperative diagnosis:  1.  Mixed urinary incontinence   Postoperative diagnosis: 1.  Mixed urinary incontinence   Procedure(s): 1.  Cystoscopy with injection of bulkamid and botox  100 units    Surgeon: Valli Shank, MD   Assistants:  None   Anesthesia:  General   Complications:  None   EBL:  minimal   Specimens: 1. none   Drains/Catheters: 1.  none   Intraoperative findings:   Normal urethral meatus.  Short urethra Bilateral orthotopic Uos Normal bladder mucosa   Indication:  74 yo woman with symptomatic mixed urinary incontinence.   Description of procedure:   After risks and benefits of the procedure discussed with the patient, informed consent was obtained.  The patient was taken to the operating placed in the supine position.  Anesthesia was induced and antibiotics were administered.  The patient was then repositioned in the dorsolithotomy position.  She was prepped and draped in usual sterile fashion a timeout performed with the attending present.  100 units of Botox  were mixed with 10 cc of injectable saline.  The injection cystoscope was placed into the urethral meatus into the bladder under direct visualization.  1 mL increments were then injected over the posterior bladder wall taking care to avoid the trigone.  The cystoscope was removed.  Next, the cystoscope was assembled with the Bulkamid system.  It was then placed in the urethral meatus and advanced into the bladder under direct visualization.  Prior cystoscopy had been done which noted normal anatomic landmarks.  These were again verified during cystoscopy today.  The cystoscope was brought back to the bladder neck and the needle was advanced through the needle guide at the 1 o'clock position.  Once it was visualized and advanced it was rotated to the 5 o'clock position.  Bulkamid was then injected until blood was seen.  This was then repeated at the 1 o'clock position in the 7 o'clock  position until coaptation was noted.   This concluded the case.  The patient's bladder was left with approximately 200 cc of sterile saline.  The patient emerged from anesthesia and was transferred the PACU in stable condition.   Plan:  Plan for patient to void in PACU prior to discharge.

## 2024-06-26 NOTE — Discharge Instructions (Signed)
Cystoscopy with Bulkamid patient instructions  Following a cystoscopy, a catheter (a flexible rubber tube) is sometimes left in place to empty the bladder. This may cause some discomfort or a feeling that you need to urinate. Your doctor determines the period of time that the catheter will be left in place. You may have bloody urine for two to three days (Call your doctor if the amount of bleeding increases or does not subside).  You may pass blood clots in your urine, especially if you had a biopsy. It is not unusual to pass small blood clots and have some bloody urine a couple of weeks after your cystoscopy. Again, call your doctor if the bleeding does not subside. You may have: Dysuria (painful urination) Frequency (urinating often) Urgency (strong desire to urinate)  These symptoms are common especially if medicine is instilled into the bladder or a ureteral stent is placed. Avoiding alcohol and caffeine, such as coffee, tea, and chocolate, may help relieve these symptoms. Drink plenty of water, unless otherwise instructed. Your doctor may also prescribe an antibiotic or other medicine to reduce these symptoms.  Cystoscopy results are available soon after the procedure; biopsy results usually take two to four days. Your doctor will discuss the results of your exam with you. Before you go home, you will be given specific instructions for follow-up care. Special Instructions:   If you are going home with a catheter in place do not take a tub bath until removed by your doctor.   You may resume your normal activities.   Do not drive or operate machinery if you are taking narcotic pain medicine.   Be sure to keep all follow-up appointments with your doctor.   Call Your Doctor If: The catheter is not draining You have severe pain You are unable to urinate You have a fever over 101 You have severe bleeding

## 2024-06-26 NOTE — Transfer of Care (Signed)
 Immediate Anesthesia Transfer of Care Note  Patient: Vanessa Christensen  Procedure(s) Performed: CYSTOSCOPY, WITH INJECTION OF BLADDER NECK OR BLADDER WALL INJECTION, BULKING AGENT, URETHRA  Patient Location: PACU  Anesthesia Type:MAC  Level of Consciousness: awake, alert , oriented, and patient cooperative  Airway & Oxygen Therapy: Patient Spontanous Breathing and Patient connected to face mask oxygen  Post-op Assessment: Report given to RN and Post -op Vital signs reviewed and stable  Post vital signs: Reviewed and stable  Last Vitals:  Vitals Value Taken Time  BP 143/75 06/26/24 11:22  Temp    Pulse 77 06/26/24 11:25  Resp 11 06/26/24 11:25  SpO2 100 % 06/26/24 11:25  Vitals shown include unfiled device data.  Last Pain:  Vitals:   06/26/24 0902  TempSrc: Oral  PainSc: 0-No pain         Complications: No notable events documented.

## 2024-06-26 NOTE — Anesthesia Postprocedure Evaluation (Signed)
"   Anesthesia Post Note  Patient: Vanessa Christensen  Procedure(s) Performed: CYSTOSCOPY, WITH INJECTION OF BLADDER NECK OR BLADDER WALL INJECTION, BULKING AGENT, URETHRA     Patient location during evaluation: PACU Anesthesia Type: MAC Level of consciousness: awake Pain management: pain level controlled Vital Signs Assessment: post-procedure vital signs reviewed and stable Respiratory status: spontaneous breathing, nonlabored ventilation and respiratory function stable Cardiovascular status: stable and blood pressure returned to baseline Postop Assessment: no apparent nausea or vomiting Anesthetic complications: no   No notable events documented.  Last Vitals:  Vitals:   06/26/24 1145 06/26/24 1157  BP: (!) 153/82 (!) 169/91  Pulse: 73 61  Resp: 13 10  Temp: 36.6 C   SpO2: 99% 100%    Last Pain:  Vitals:   06/26/24 1157  TempSrc:   PainSc: 0-No pain                 Delon Aisha Arch      "

## 2024-06-26 NOTE — H&P (Signed)
 "    History of present illness: 74 yo woman with mixed urinary incontinence here for botox  and bulkamid.   Review of systems: A 12 point comprehensive review of systems was obtained and is negative unless otherwise stated in the history of present illness.  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   History of total right knee replacement 01/28/2020   Vitamin D  deficiency 08/29/2018   Insulin  resistance 08/29/2018   Other specified glaucoma 08/14/2018   Incontinence of urine in female 11/07/2017   Vertigo 11/07/2017   Left knee pain 10/25/2016   Genetic testing 08/08/2016   Hypothyroidism 12/02/2015   Anxiety and depression 12/02/2015   OAB (overactive bladder) 12/02/2015   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/02/2015   BMI 37.0-37.9, adult 12/02/2015    Medications Ordered Prior to Encounter[1]  Past Medical History:  Diagnosis Date   Anemia    hx of  iron deficient   Anxiety    pt denies   Cancer (HCC) 10/04/2006   breast    left   Cataracts, both eyes    Depression    Depression    Diverticulosis of colon    Glaucoma    Hyperlipidemia    Hypertension    no meds   Joint pain    Knee pain    Obesity    Stroke (HCC)    TIA 03/14/24   Thyroid  activity decreased     Past Surgical History:  Procedure Laterality Date   BREAST SURGERY Bilateral    CATARACT EXTRACTION      COLONOSCOPY     GLAUCOMA REPAIR     MASTECTOMY, RADICAL Bilateral    neulasta induced sterile abscesses     THYROIDECTOMY, PARTIAL     TONSILLECTOMY     TOTAL KNEE ARTHROPLASTY Right    TOTAL KNEE ARTHROPLASTY Left 10/27/2022   Procedure: LEFT TOTAL KNEE ARTHROPLASTY;  Surgeon: Vernetta Lonni GRADE, MD;  Location: [facility];  Service: Orthopedics;  Laterality: Left;   TOTAL KNEE REVISION Right 05/07/2020   Procedure: RIGHT TOTAL KNEE REVISION ARTHROPLASTY;  Surgeon: Vernetta Lonni GRADE, MD;  Location: [facility];  Service: Orthopedics;  Laterality: Right;    Social History[2]  Family History  Problem Relation Age of Onset   CVA Mother    Cancer Mother 55       breast cancer    Heart disease Mother    Stroke Mother    Obesity Mother    Colon polyps Father    Leukemia Father    High blood pressure Father    High Cholesterol Father    Heart disease Father    Cancer Maternal Aunt 32       breast cancer    Cancer Maternal Aunt 68       breast cancer   Colon cancer Maternal Aunt    Cancer Cousin 32       breast cancer   Esophageal  cancer Neg Hx    Rectal cancer Neg Hx    Stomach cancer Neg Hx     PE: Vitals:   06/26/24 0902  BP: (!) 157/97  Pulse: 79  Resp: 16  Temp: 98.9 F (37.2 C)  TempSrc: Oral  SpO2: 97%  Weight: 89.4 kg  Height: 5' 2 (1.575 m)   Patient appears to be in no acute distress  patient is alert and oriented x3 Atraumatic normocephalic head No increased work of breathing, no audible wheezes/rhonchi Lower extremities are symmetric without appreciable edema Grossly neurologically intact No identifiable skin lesions  No results for input(s): WBC, HGB, HCT in the last 72 hours. No results for input(s): NA, K, CL, CO2, GLUCOSE, BUN, CREATININE, CALCIUM  in the last 72 hours. No results for input(s): LABPT, INR in the last 72 hours. No results for input(s): LABURIN in the last 72 hours. Results for orders placed or  performed during the hospital encounter of 10/23/22  Surgical pcr screen     Status: None   Collection Time: 10/23/22  9:13 AM   Specimen: Nasal Mucosa; Nasal Swab  Result Value Ref Range Status   MRSA, PCR NEGATIVE NEGATIVE Final   Staphylococcus aureus NEGATIVE NEGATIVE Final    Comment: (NOTE) The Xpert SA Assay (FDA approved for NASAL specimens in patients 29 years of age and older), is one component of a comprehensive surveillance program. It is not intended to diagnose infection nor to guide or monitor treatment. Performed at [facility], [address], [place], [place] [address]     Imaging:  Assessment/Plan: Mixed urinary incontinence: -   Shomari Matusik D Aidynn Krenn       [1]  No current facility-administered medications on file prior to encounter.   Current Outpatient Medications on File Prior to Encounter  Medication Sig Dispense Refill   Ascorbic Acid  (VITAMIN C PO) Take 1 tablet by mouth daily.     aspirin  EC 81 MG tablet Take 1 tablet (81 mg total) by mouth daily. Swallow whole. 30 tablet 12   B Complex-C (SUPER B COMPLEX PO) Take 1 tablet by mouth daily after supper.     cetirizine (ZYRTEC) 10 MG tablet Take 10 mg by mouth daily.     Cholecalciferol  (VITAMIN D ) 50 MCG (2000 UT) tablet Take 2,000 Units by mouth daily.     clonazePAM  (KLONOPIN ) 0.5 MG tablet Take 1 tablet (0.5 mg total) by mouth 2 (two) times daily as needed for anxiety. 60 tablet 1   dorzolamide -timolol  (COSOPT ) 2-0.5 % ophthalmic solution Place 1 drop into both eyes 2 (two) times daily.     doxycycline  (VIBRA -TABS) 100 MG tablet Take 1 tablet (100 mg total) by mouth 2 (two) times daily. 14 tablet 0   levothyroxine  (SYNTHROID ) 75 MCG tablet TAKE 1 TABLET BY MOUTH EVERY DAY 90 tablet 1   losartan  (COZAAR ) 25 MG tablet Take 1 tablet (25 mg total) by mouth daily. 30 tablet 2   meclizine  (ANTIVERT ) 12.5 MG tablet Take 1 tablet (12.5 mg total) by mouth 3 (three) times daily as needed for  dizziness. 30 tablet 0   mirabegron  ER (MYRBETRIQ ) 25 MG TB24 tablet Take 1 tablet (25 mg total) by mouth daily. 30 tablet 3   psyllium (METAMUCIL) 58.6 % powder Take 1 packet by mouth every other day.     traZODone  (DESYREL ) 50 MG tablet Take 0.5-1 tablets (25-50 mg total) by mouth at bedtime as needed. for sleep (Patient taking differently: Take 50 mg by mouth at  bedtime. for sleep) 90 tablet 2   ondansetron  (ZOFRAN ) 4 MG tablet Take 1 tablet (4 mg total) by mouth every 8 (eight) hours as needed for nausea or vomiting. 20 tablet 0  [2]  Social History Tobacco Use   Smoking status: Former    Current packs/day: 0.00    Types: Cigarettes    Quit date: 11/02/1977    Years since quitting: 46.6   Smokeless tobacco: Never  Vaping Use   Vaping status: Never Used  Substance Use Topics   Alcohol use: No   Drug use: No   "

## 2024-06-27 ENCOUNTER — Ambulatory Visit: Admitting: Occupational Therapy

## 2024-06-27 ENCOUNTER — Encounter (HOSPITAL_COMMUNITY): Payer: Self-pay | Admitting: Urology

## 2024-06-30 ENCOUNTER — Ambulatory Visit: Admitting: Bariatrics

## 2024-07-01 ENCOUNTER — Ambulatory Visit: Admitting: Occupational Therapy

## 2024-07-03 ENCOUNTER — Ambulatory Visit: Admitting: Occupational Therapy

## 2024-07-03 DIAGNOSIS — R29818 Other symptoms and signs involving the nervous system: Secondary | ICD-10-CM

## 2024-07-03 DIAGNOSIS — R42 Dizziness and giddiness: Secondary | ICD-10-CM | POA: Diagnosis not present

## 2024-07-03 DIAGNOSIS — R41842 Visuospatial deficit: Secondary | ICD-10-CM

## 2024-07-03 NOTE — Therapy (Signed)
 " OUTPATIENT OCCUPATIONAL THERAPY NEURO  Treatment Note & Discharge Note  Patient Name: Vanessa Christensen MRN: [id number] DOB:12/16/1950, 74 y.o., female Today's Date: 07/03/2024  PCP: Mahlon Comer BRAVO, MD REFERRING PROVIDER:   Douglass Kenney NOVAK, FNP   OCCUPATIONAL THERAPY DISCHARGE SUMMARY  Visits from Start of Care: 16  Current functional level related to goals / functional outcomes: Pt met all LTGs.  Pt has demonstrated and reports improvements in activity tolerance and management of daily routine tasks and medications.  Pt is utilizing homemade pill map and pill box to manage medications and is utilizing a daily to do list in manageable chunks to increase active participation in daily, routine tasks.  Pt is reporting improvements in vision and ability to read with and without use of adaptive devices.   Remaining deficits: Slowed reading and difficulty with fine print   Education / Equipment: Modifications and adaptive equipment for reading, strategies to increase recall and engagement in routine, daily tasks with list making and use of routines for carryover and recall   Patient agrees to discharge. Patient goals were met. Patient is being discharged due to being pleased with the current functional level..     END OF SESSION:  OT End of Session - 07/03/24 1242     Visit Number 16    Number of Visits 22    Date for Recertification  08/26/24    Authorization Type Medicare Part A&B / BCBS Fed 2025    OT Start Time 1022    OT Stop Time 1104    OT Time Calculation (min) 42 min                        Past Medical History:  Diagnosis Date   Anemia    hx of  iron deficient   Anxiety    pt denies   Cancer (HCC) 10/04/2006   breast    left   Cataracts, both eyes    Depression    Depression    Diverticulosis of colon    Glaucoma    Hyperlipidemia    Hypertension    no meds   Joint pain    Knee pain    Obesity    Stroke (HCC)    TIA 03/14/24    Thyroid  activity decreased    Past Surgical History:  Procedure Laterality Date   BREAST SURGERY Bilateral    CATARACT EXTRACTION     COLONOSCOPY     CYSTOSCOPY WITH INJECTION N/A 06/26/2024   Procedure: CYSTOSCOPY, WITH INJECTION OF BLADDER NECK OR BLADDER WALL;  Surgeon: Elisabeth Valli BIRCH, MD;  Location: [facility];  Service: Urology;  Laterality: N/A;  CYSTOSCOPY WITH BOTOX  AND BULKAMID   GLAUCOMA REPAIR     INJECTION, BULKING AGENT, URETHRA N/A 06/26/2024   Procedure: INJECTION, BULKING AGENT, URETHRA;  Surgeon: Elisabeth Valli BIRCH, MD;  Location: [facility];  Service: Urology;  Laterality: N/A;   MASTECTOMY, RADICAL Bilateral    neulasta induced sterile abscesses     THYROIDECTOMY, PARTIAL     TONSILLECTOMY     TOTAL KNEE ARTHROPLASTY Right    TOTAL KNEE ARTHROPLASTY Left 10/27/2022   Procedure: LEFT TOTAL KNEE ARTHROPLASTY;  Surgeon: Vernetta Lonni GRADE, MD;  Location: [facility];  Service: Orthopedics;  Laterality: Left;   TOTAL KNEE REVISION Right 05/07/2020   Procedure: RIGHT TOTAL KNEE REVISION ARTHROPLASTY;  Surgeon: Vernetta Lonni GRADE, MD;  Location: [facility];  Service: Orthopedics;  Laterality: Right;   Patient Active Problem  List   Diagnosis Date Noted   CVA (cerebral vascular accident) (HCC) 03/14/2024   Cellulitis of right leg 12/21/2023   Onychomycosis 12/21/2023   Atherosclerosis of artery 03/12/2023   Adrenal nodule 11/16/2022   Thyroid  nodule 11/16/2022   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   Generalized obesity 10/05/2022   Memory loss 06/12/2022   Essential hypertension 04/13/2022   Obesity (BMI 30-39.9) 04/13/2022   Stress incontinence 11/08/2021   Status post revision of total replacement of right knee 05/07/2020   Failed total knee, right, 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/2020   History of total right knee replacement 01/28/2020   Vitamin D  deficiency 08/29/2018   Insulin  resistance 08/29/2018   Other specified glaucoma 08/14/2018   Incontinence of  urine in female 11/07/2017   Vertigo 11/07/2017   Left knee pain 10/25/2016   Genetic testing 08/08/2016   Hypothyroidism 12/02/2015   Anxiety and depression 12/02/2015   OAB (overactive bladder) 12/02/2015   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/02/2015   BMI 37.0-37.9, adult 12/02/2015    ONSET DATE: referral 04/14/24  REFERRING DIAG: H51.9 (ICD-10-CM) - Abnormal eye movements   THERAPY DIAG:  Other symptoms and signs involving the nervous system  Visuospatial deficit  Rationale for Evaluation and Treatment: Rehabilitation  SUBJECTIVE:   SUBJECTIVE STATEMENT: Pt reports that she has been using the med list and to-do list and checking things off.  Pt accompanied by: self and significant other  PERTINENT HISTORY: 74 y.o. female with medical history significant of hypertension, hyperlipidemia who presented to the ED due to dizziness.  Patient had been dizzy for 24 hours which has been progressively worsening and with vomiting.  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  On PT eval pt with disconjugate gaze with R not tracking with L eye.  PMH/o Anemia, anxiety, L breast CA s/p B mastectomy, depression, HLD, HTN, B TKA   PRECAUTIONS: Fall  WEIGHT BEARING RESTRICTIONS: No  PAIN:  Are you having pain? No  FALLS: Has patient fallen in last 6 months? Yes. Number of falls 2 - one in the backyard and one entering restaurant   LIVING ENVIRONMENT: Lives with: lives with their spouse Lives in: House/apartment Stairs: 2 step to enter with handrail; 2 story home with bedroom on 1st floor Has following equipment at home: Single point cane and Walker - 2 wheeled, built in seat in shower that she does not use, grab bar next to the toilet, in plans for remodel in bathroom to elevate toilet seat  PLOF: Independent and Independent with basic ADLs; would ask for assistance with balance when navigating curbs in  the community and if dropping item  PATIENT GOALS: to walk without tripping  OBJECTIVE:  Note: Objective measures were completed at Evaluation unless otherwise noted.  HAND DOMINANCE: Right  ADLs: Transfers/ambulation related to ADLs: Mod I, will occasionally use walker when vertigo is really bad Grooming: Mod I UB Dressing: Mod I LB Dressing: husband assisting with donning socks s/p knee surgery Toileting: Mod I Bathing: Mod I Tub Shower transfers: Mod I   IADLs:  Pt is doing majority of the cooking and will do some household tasks but feels that she doesn't have the energy.  Pt reports that the lighting in stores will effect her.   MOBILITY STATUS: Hx of falls and occasional hand held assist especially when in the community, has used RW when experiencing more vertigo symptoms  POSTURE COMMENTS:  rounded shoulders and forward head   ACTIVITY TOLERANCE: Activity tolerance: decreased endurance s/p CVA  FUNCTIONAL OUTCOME MEASURES: PSFS  07/03/24:    UPPER EXTREMITY ROM:  WFL bilaterally   UPPER EXTREMITY MMT:   Grossly 4+ to 5 overall   COORDINATION: 9 Hole Peg test: Right: 29.63 sec; Left: 33.28 sec  SENSATION: Pt reports slight N/T in R foot d/t neuropathy   COGNITION: Overall cognitive status: Within functional limits for tasks assessed  VISION: Subjective report: wears glasses 80% of the time Baseline vision: Wears glasses all the time Visual history: cataracts removal  VISION ASSESSMENT: Tracking/Visual pursuits: Right eye does not track laterally and Decreased smoothness with horizontal tracking; Diplopia with vertical tracking upwards Convergence: Impaired: reporting diplopia 6-7 from nose Visual Fields: no apparent deficits  Completed Convergence Insufficiency Symptom Survey (CISS): 21/56 21 or higher is suggestive of convergence insufficiency  Patient has difficulty with following activities due to following visual impairments: reading, tying  shoes  PERCEPTION: WFL  PRAXIS: WFL                                                                                                                             TREATMENT DATE:  07/03/24 Pt reports feeling like she is ready to discharge.  Pt reports that she is using her daily to-do lists and checking things off and feels more organized.  Pt reports that she is doing more tasks around the house and is completing tasks when focusing on one step or task at a time. CISS: 10 with pt reporting that she continues to have some difficulty with blurring and losing her place when reading.  Vision: pt reports that she is using her magnifier to aid in reading of smaller print.  OT reiterated availability of large print books at the [facility].  OT showed pt how to access card catalog through website and provided with web address.   Medication management: Attempted pill box assessment, however pt only completing task with pills that matched her medications, omitting the remainder.  OT attempted to encourage her to fill pill box as if it were for someone else, but she still struggled, therefore terminated task.  Pt reports that she is using pill map and AM/PM pill box and that is working for her.   06/23/24 Initial focus of session on reviewing strategies/techniques from previous OT sessions for med management including pill map (spouse going to edit per recommendation), making a visual aide laminated for bathroom to place on wall for max benefit for recalling routine, etc.  Cognition: Engaged pt in problem solving table top task initially complex level for planning out a schedule for the day - note pt needing extensive cues to attend, became easily distracted, etc. Needing MOD assist to problem solve - however able to plan her realistic day more quickly and easily than figurative scenario.  Recall/STM: Pt engaged in table top task for memory game for card matching, having  moderate difficulty recalling  placement of numbers/cards. Note pt and spouse state that she has significantly improved and would like to have discussion about DC from OT in upcoming sessions.  06/19/24 Medication management/organization: Pt brought in pill map spreadsheet that her spouse had created.  Engaged in discussion about modifications to increase ease, by adding a space between AM and PM meds to increase distinction and ensure that all AM meds are organized together. Organization: educated on various strategies to increase engagement in routine, household tasks as pt reporting lacking the motivation or initiative to complete.  OT encouraging making a list and spreading tasks out throughout the week; dedicating a specific, short amount of time; breaking tasks into smaller chunks; and use of incentives.   Therapeutic activity: engaged in novel card game speed with focus on taking an aspect of previous golf solitaire task to address recall and increasing challenge by adding speed component and opponent to challenge pt's sequencing with decreased control of each outcome.  Pt benefiting from increased time, when possible, for recall, demonstrating improvements with repetition.  OT educating on functional carryover of task to routine tasks.     PATIENT EDUCATION: Education details: vision, sequencing/organization, memory strategies Person educated: Patient and Spouse Education method: Explanation and Handouts Education comprehension: verbalized understanding and needs further education  HOME EXERCISE PROGRAM: Access Code: [email or web address] URL: [email or web address] Date: 04/29/2024 Prepared by: [facility] - Outpatient  Rehab - [facility]  Exercises - Seated Horizontal Smooth Pursuit  - 2 x daily - 10 reps - Seated Proximal-Distal Smooth Pursuit  - 2 x daily - 10 reps   GOALS: Goals reviewed with patient? Yes   SHORT TERM GOAL: Target Date 06/28/24 1  Pt will verbalize understanding of energy  conservation strategies and recall 3 in use. Baseline: easily fatigued and decreased activity tolerance 07/03/24: pt reporting increased engagement in routine tasks, able to recall energy conservation strategies without any cues Goal status: MET  2. Pt will verbalize understanding of memory compensation strategies and recall 3 in use Baseline: To be educated 07/03/24: pt utilizing to do lists, note taking, and repeating information Goal status: MET  LONG TERM GOAL: Target Date 08/26/24 1  Pt will report improved score on CISS to <21 for improved convergence. Baseline: 21/56 07/03/24: 10/56 Goal status: MET  2.  Pt will complete table top visual scanning activity at Mod I level with use of adaptive strategies PRN. Baseline: TBD 07/03/24: utilizing line follow and reports need to use magnifying glass/page that she has purchased  Goal status: MET  3. Pt will complete simulated med mgmt task with no errors   Baseline: Pt completed with approximately 50% accuracy, reports she has pill organizer for just morning and night, whereas testing box had 4 individual boxes for each day.  07/03/24:  see above  Goal status: Deferred - pt is able to use her homemade pill map and fill her AM/PM pill box but demonstrating difficulty with pill box assessment with novel meds and 4 time slots   ASSESSMENT:  CLINICAL IMPRESSION: Patient is a 74 y.o. female who was seen today for occupational therapy treatment for disconjugate gaze s/p CVA and cognitive retraining. Pt continues to be an active participant in organization and use of strategies to aid in increased independence with medications and daily, routine tasks in home.  Pt reporting anxiety has improved significantly and she is finding increased endurance to engage in routine tasks, including vacuuming recently.  Pt reports use of to-do lists to increase  memory and increase focus on tasks. Pt is pleased with current status and improvements in routine tasks  utilizing strategies from therapy sessions.  Pt receptive to and in agreement with d/c at this time.  PERFORMANCE DEFICITS: in functional skills including ADLs, IADLs, ROM, balance, endurance, decreased knowledge of precautions, decreased knowledge of use of DME, and vision and psychosocial skills including coping strategies and environmental adaptation.     PLAN:  OT FREQUENCY: 1-2x/week  OT DURATION: 6 weeks  PLANNED INTERVENTIONS: 97168 OT Re-evaluation, 97535 self care/ADL training, 02889 therapeutic exercise, 97530 therapeutic activity, 97112 neuromuscular re-education, functional mobility training, visual/perceptual remediation/compensation, psychosocial skills training, energy conservation, coping strategies training, patient/family education, and DME and/or AE instructions  RECOMMENDED OTHER SERVICES: NA  CONSULTED AND AGREED WITH PLAN OF CARE: Patient and family member/caregiver   KAYLENE DOMINO, OTR/L 07/03/2024, 12:43 PM  [facility] at [facility] [address], [address] [place], [place] [address] Phone # [phone] Fax # [phone]          "

## 2024-07-09 ENCOUNTER — Encounter: Payer: Self-pay | Admitting: Bariatrics

## 2024-07-09 ENCOUNTER — Ambulatory Visit: Admitting: Bariatrics

## 2024-07-09 VITALS — BP 140/74 | HR 83 | Ht 62.0 in | Wt 201.0 lb

## 2024-07-09 DIAGNOSIS — Z6836 Body mass index (BMI) 36.0-36.9, adult: Secondary | ICD-10-CM | POA: Diagnosis not present

## 2024-07-09 DIAGNOSIS — E66812 Obesity, class 2: Secondary | ICD-10-CM

## 2024-07-09 DIAGNOSIS — I10 Essential (primary) hypertension: Secondary | ICD-10-CM

## 2024-07-09 DIAGNOSIS — E669 Obesity, unspecified: Secondary | ICD-10-CM

## 2024-07-09 DIAGNOSIS — R632 Polyphagia: Secondary | ICD-10-CM | POA: Diagnosis not present

## 2024-07-09 NOTE — Progress Notes (Signed)
 "                                                                                                             WEIGHT SUMMARY AND BIOMETRICS  Weight Lost Since Last Visit: 0  Weight Gained Since Last Visit: 5lb   Vitals BP: (!) 140/74 (Per patient has not taken BP meds yet) Pulse Rate: 83 SpO2: 98 %   Anthropometric Measurements Height: 5' 2 (1.575 m) Weight: 201 lb (91.2 kg) BMI (Calculated): 36.75 Weight at Last Visit: 196lb Weight Lost Since Last Visit: 0 Weight Gained Since Last Visit: 5lb Starting Weight: 241lb Total Weight Loss (lbs): 40 lb (18.1 kg)   Body Composition  Body Fat %: 46.7 % Fat Mass (lbs): 94 lbs Muscle Mass (lbs): 101.8 lbs Total Body Water  (lbs): 74.2 lbs Visceral Fat Rating : 16   Other Clinical Data Fasting: no Labs: no Today's Visit #: 22 Starting Date: 08/13/18    OBESITY Vanessa Christensen is here to discuss her progress with her obesity treatment plan along with follow-up of her obesity related diagnoses.    Nutrition Plan: the Category 3 plan - 15% adherence.  Current exercise: Stationary bike  Interim History:  She is up 5 lbs since her last visit.  Eating all of the food on the plan., Protein intake is as prescribed, and Water  intake is adequate.   Pharmacotherapy: Vanessa Christensen had been on Wegovy  in the past.  Hunger is moderately controlled. Her appetite is up slightly.  Cravings are moderately controlled. She wants something after she eats.  Assessment/Plan:   Vanessa Christensen endorses excessive hunger.  Medication(s): No anti-obesity medications. She had been on Wegovy  in the past.  Effects of medication:  moderately controlled. Cravings are moderately controlled.   Plan: Medication(s): no anti-obesity medications.  Will increase water , protein and fiber to help assuage hunger.  Will minimize foods that have a high glucose index/load to minimize reactive hypoglycemia.  Will eat more meals at home.  Will do more meal planning.   Will eat out less.   Hypertension Hypertension control uncertain.  Medication(s): Cozaar  25 mg daily  BP Readings from Last 3 Encounters:  07/09/24 (!) 140/74  06/26/24 (!) 169/91  06/17/24 (!) 158/91   Lab Results  Component Value Date   CREATININE 0.71 06/17/2024   CREATININE 0.68 05/22/2024   CREATININE 0.64 03/19/2024   Lab Results  Component Value Date   GFR 85.15 05/22/2024   GFR 86.51 03/19/2024   GFR 74.97 01/15/2023    Plan: Continue all antihypertensives at current dosages. No added salt. Will keep sodium content to 1,500 mg or less per day.     Generalized Obesity: Current BMI BMI (Calculated): 36.75   Pharmacotherapy Plan No anti-obesity medications  Vanessa Christensen is currently in the action stage of change. As such, her goal is to continue with weight loss efforts.  She has agreed to the Category 3 plan.  Exercise goals: Older adults should determine their level of effort for physical activity relative to their level of fitness.  She is  riding her stationary bike more.   Behavioral modification strategies: increasing lean protein intake, decreasing simple carbohydrates , no meal skipping, decrease eating out, meal planning , better snacking choices, planning for success, increasing vegetables, increasing fiber rich foods, decreasing sodium intake, decrease junk food, ways to avoid boredom eating, ways to avoid night time snacking, avoiding temptations, and keep healthy foods in the home.  Vanessa Christensen has agreed to follow-up with our clinic in 4 weeks.     Objective:   VITALS: Per patient if applicable, see vitals. GENERAL: Alert and in no acute distress. CARDIOPULMONARY: No increased WOB. Speaking in clear sentences.  PSYCH: Pleasant and cooperative. Speech normal rate and rhythm. Affect is appropriate. Insight and judgement are appropriate. Attention is focused, linear, and appropriate.  NEURO: Oriented as arrived to appointment on time with no prompting.    Attestation Statements:   This was prepared with the assistance of Engineer, Civil (consulting).  Occasional wrong-word or sound-a-like substitutions may have occurred due to the inherent limitations of voice recognition.   Clayborne Daring, DO    "

## 2024-08-05 ENCOUNTER — Ambulatory Visit: Admitting: Bariatrics

## 2024-11-04 ENCOUNTER — Encounter
